# Patient Record
Sex: Female | Born: 1945 | Hispanic: Yes | Marital: Married | Smoking: Never smoker
Health system: Southern US, Community
[De-identification: ages and names within clinical notes are randomized; demographics above are authoritative.]

## PROBLEM LIST (undated history)

## (undated) ENCOUNTER — Emergency Department (HOSPITAL_COMMUNITY): Payer: Medicare Other

## (undated) DIAGNOSIS — I4891 Unspecified atrial fibrillation: Secondary | ICD-10-CM

## (undated) DIAGNOSIS — E079 Disorder of thyroid, unspecified: Secondary | ICD-10-CM

## (undated) DIAGNOSIS — E039 Hypothyroidism, unspecified: Secondary | ICD-10-CM

## (undated) DIAGNOSIS — I639 Cerebral infarction, unspecified: Secondary | ICD-10-CM

## (undated) DIAGNOSIS — E119 Type 2 diabetes mellitus without complications: Secondary | ICD-10-CM

## (undated) DIAGNOSIS — E785 Hyperlipidemia, unspecified: Secondary | ICD-10-CM

## (undated) DIAGNOSIS — I10 Essential (primary) hypertension: Secondary | ICD-10-CM

## (undated) HISTORY — DX: Type 2 diabetes mellitus without complications: E11.9

## (undated) HISTORY — PX: KNEE SURGERY: SHX244

## (undated) HISTORY — DX: Hyperlipidemia, unspecified: E78.5

## (undated) HISTORY — DX: Hypothyroidism, unspecified: E03.9

---

## 2014-01-03 ENCOUNTER — Emergency Department (HOSPITAL_COMMUNITY): Payer: Self-pay

## 2014-01-03 ENCOUNTER — Ambulatory Visit (HOSPITAL_COMMUNITY): Payer: Self-pay | Attending: Family Medicine

## 2014-01-03 ENCOUNTER — Encounter (HOSPITAL_COMMUNITY): Payer: Self-pay | Admitting: Emergency Medicine

## 2014-01-03 ENCOUNTER — Emergency Department (INDEPENDENT_AMBULATORY_CARE_PROVIDER_SITE_OTHER)
Admission: EM | Admit: 2014-01-03 | Discharge: 2014-01-03 | Disposition: A | Discharge status: Home / Self Care | Payer: Self-pay | Source: Home / Self Care | Attending: Family Medicine | Admitting: Family Medicine

## 2014-01-03 DIAGNOSIS — M25562 Pain in left knee: Secondary | ICD-10-CM

## 2014-01-03 DIAGNOSIS — M25569 Pain in unspecified knee: Secondary | ICD-10-CM

## 2014-01-03 DIAGNOSIS — E038 Other specified hypothyroidism: Secondary | ICD-10-CM

## 2014-01-03 DIAGNOSIS — I10 Essential (primary) hypertension: Secondary | ICD-10-CM

## 2014-01-03 HISTORY — DX: Disorder of thyroid, unspecified: E07.9

## 2014-01-03 HISTORY — DX: Essential (primary) hypertension: I10

## 2014-01-03 LAB — POCT I-STAT, CHEM 8
BUN: 21 mg/dL (ref 6–23)
CHLORIDE: 107 meq/L (ref 96–112)
Calcium, Ion: 1.18 mmol/L (ref 1.13–1.30)
Creatinine, Ser: 0.9 mg/dL (ref 0.50–1.10)
Glucose, Bld: 77 mg/dL (ref 70–99)
HEMATOCRIT: 46 % (ref 36.0–46.0)
Hemoglobin: 15.6 g/dL — ABNORMAL HIGH (ref 12.0–15.0)
Potassium: 4.4 mEq/L (ref 3.7–5.3)
SODIUM: 138 meq/L (ref 137–147)
TCO2: 25 mmol/L (ref 0–100)

## 2014-01-03 LAB — TSH: TSH: 9.33 u[IU]/mL — AB (ref 0.350–4.500)

## 2014-01-03 IMAGING — CR DG KNEE AP/LAT W/ SUNRISE*L*
3 series · 3 of 3 positions shown · non-contrast
Comparison: None.

CLINICAL DATA: Left knee pain.

EXAM:
DG KNEE - 3 VIEWS

[t knee ap left]
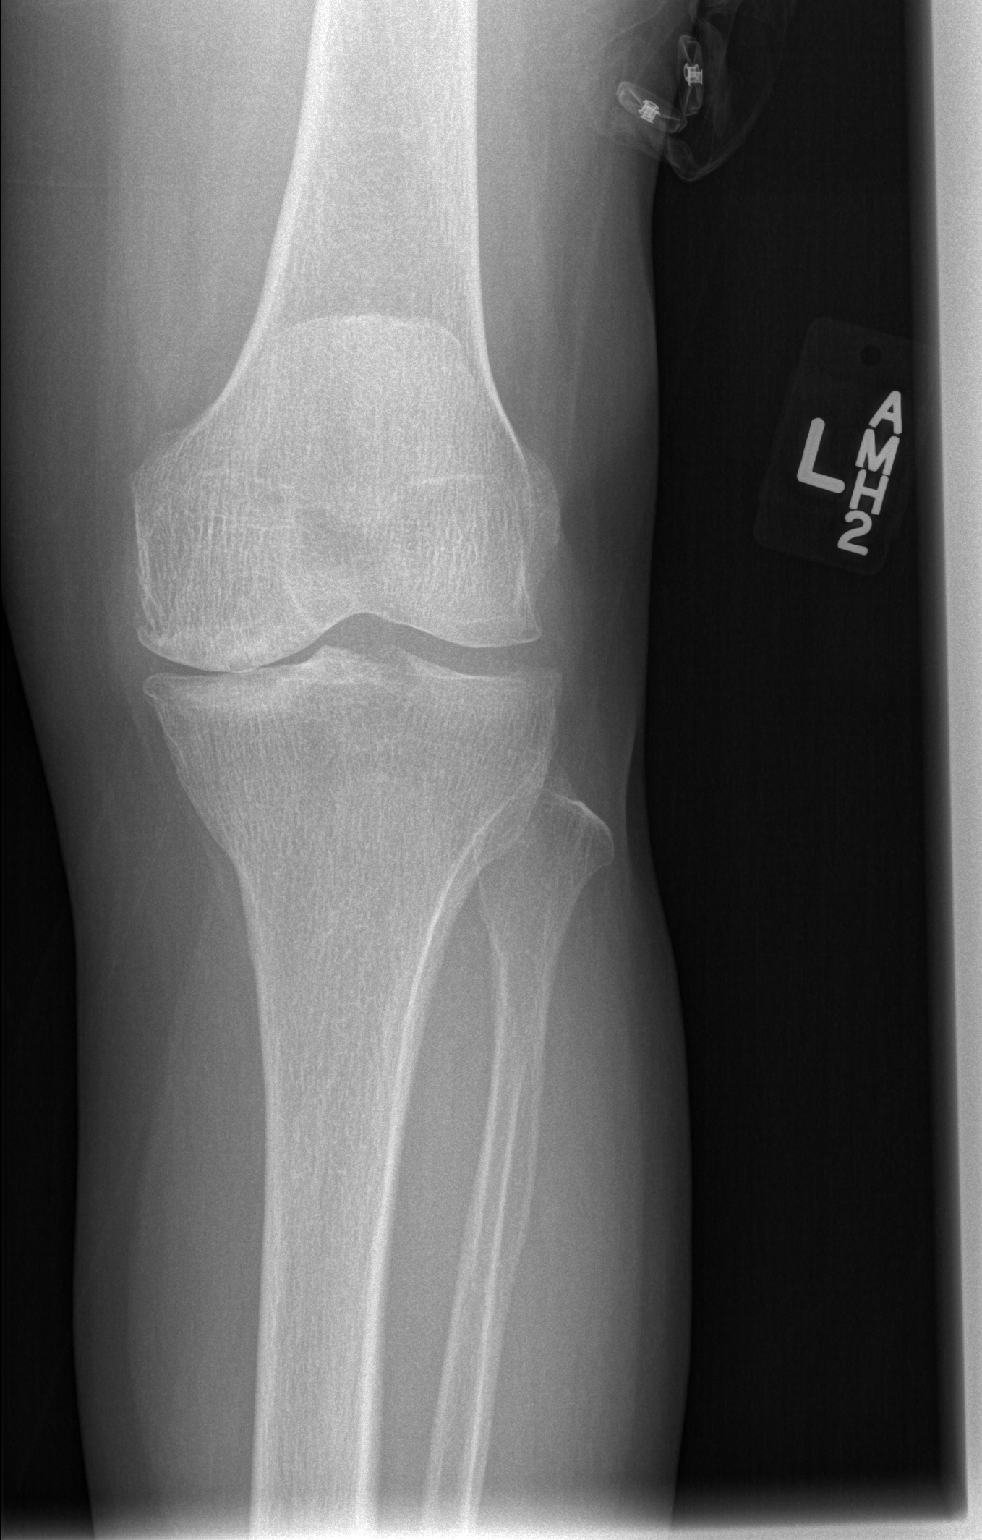

[t knee lat left]
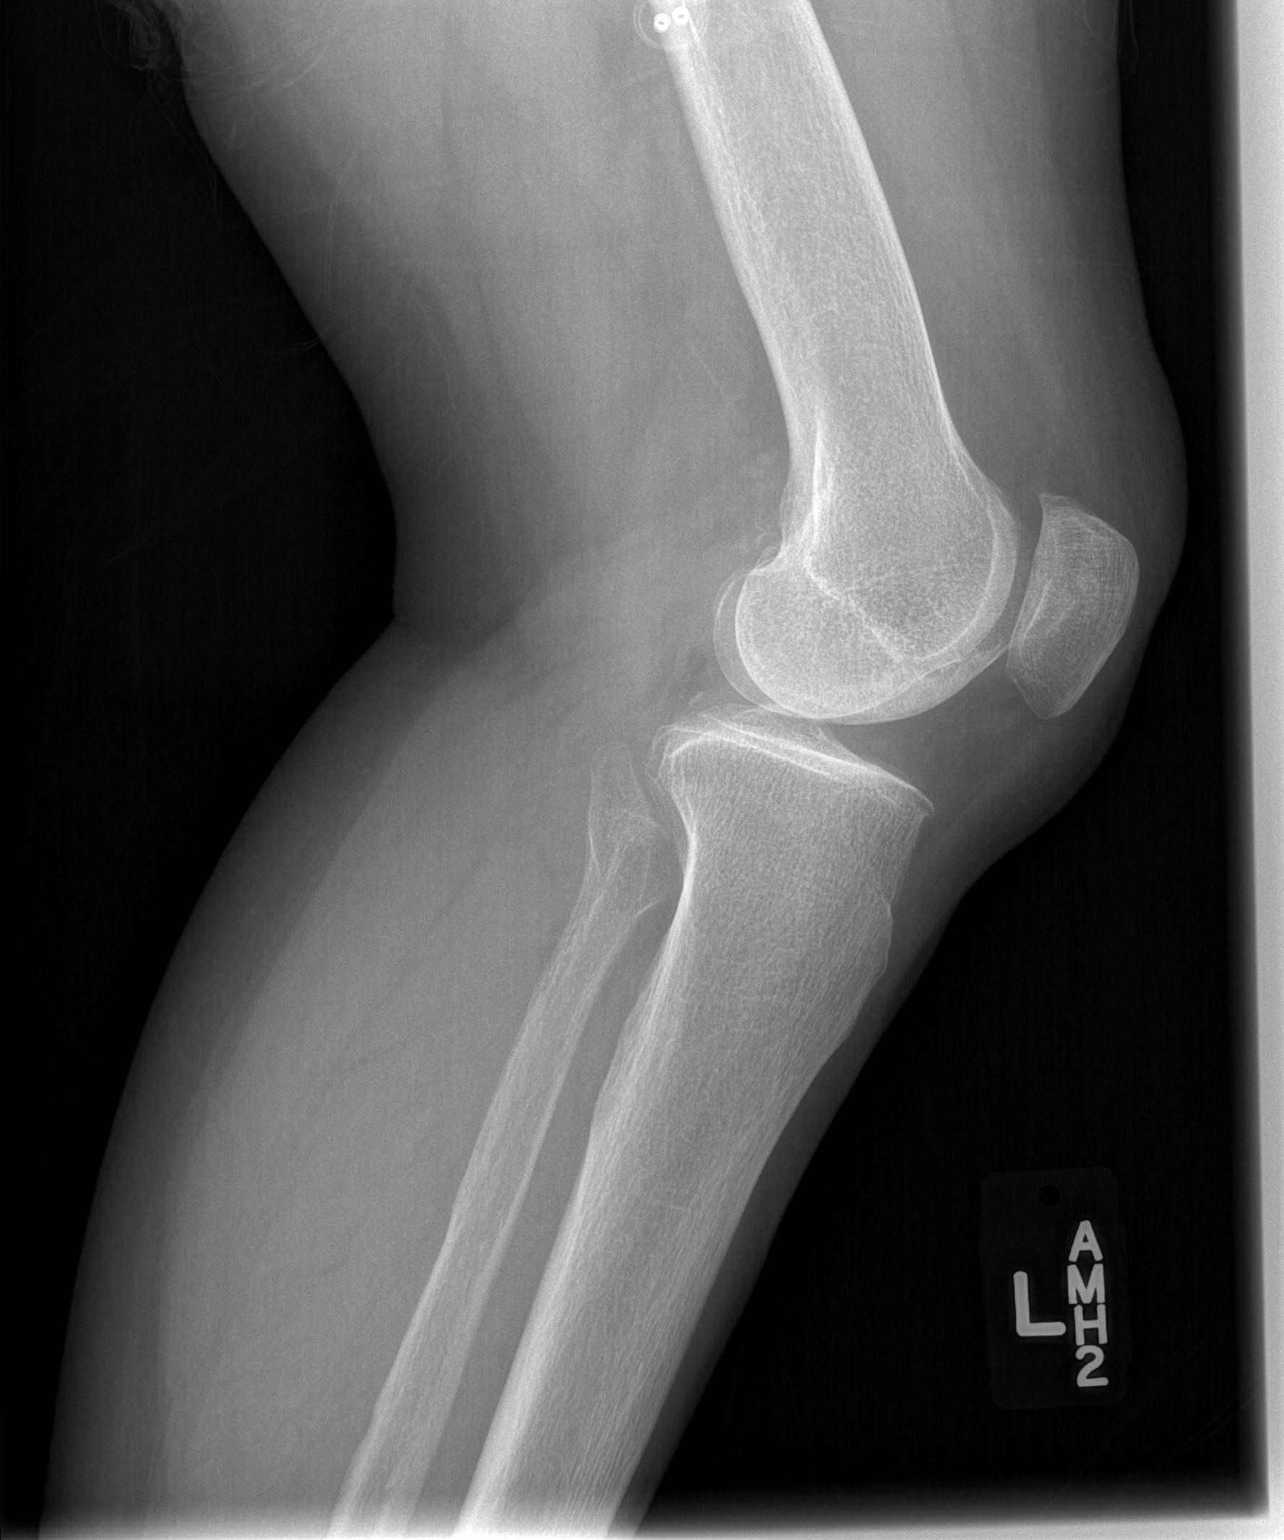

[view not recorded]
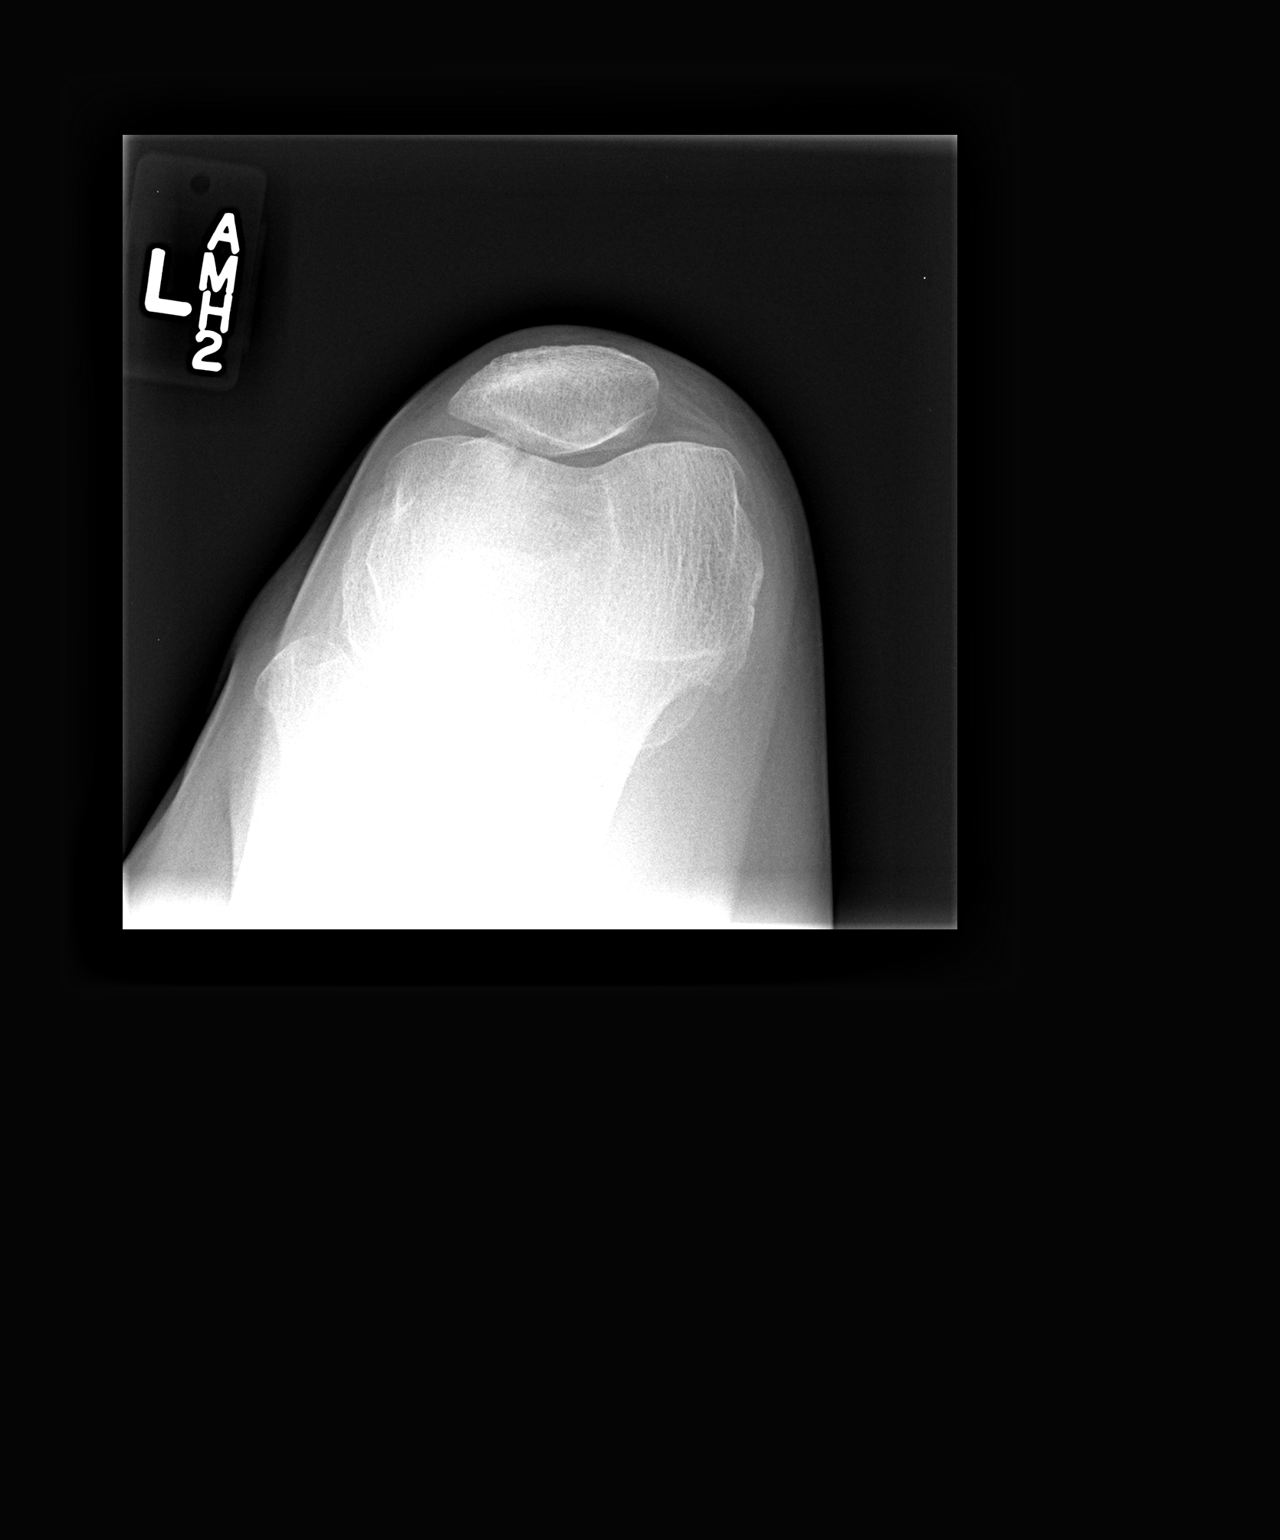

[3 of 3 positions shown; findings below may reference images not displayed]

FINDINGS: The bones appear mildly demineralized. There is moderate joint space
loss and osteophyte formation in the medial compartment. There is
mild lateral and patellofemoral compartment osteophyte formation. No
acute fracture, dislocation or significant joint effusion is seen.
IMPRESSION: Degenerative changes as described, most advanced within the medial
compartment. No acute osseous findings.

## 2014-01-03 MED ORDER — METHYLPREDNISOLONE ACETATE 40 MG/ML IJ SUSP
INTRAMUSCULAR | Status: AC
  Filled 2014-01-03: qty 5

## 2014-01-03 MED ORDER — METHYLPREDNISOLONE ACETATE 40 MG/ML IJ SUSP
40.0000 mg | Freq: Once | INTRAMUSCULAR | Status: AC
  Administered 2014-01-03: 40 mg via INTRA_ARTICULAR

## 2014-01-03 MED ORDER — HYDROCHLOROTHIAZIDE 25 MG PO TABS: 25.0000 mg | ORAL_TABLET | Freq: Every day | ORAL | Status: DC

## 2014-01-03 MED ORDER — LEVOTHYROXINE SODIUM 75 MCG PO TABS: 75.0000 ug | ORAL_TABLET | Freq: Every day | ORAL | Status: DC

## 2014-01-03 MED ORDER — TRAMADOL HCL 50 MG PO TABS: 50.0000 mg | ORAL_TABLET | Freq: Four times a day (QID) | ORAL | Status: DC | PRN

## 2014-01-03 NOTE — ED Provider Notes (Signed)
Jamie Mitchell is a 68 y.o. female who presents to Urgent Care today for left knee pain. Patient lives in [place] part time [place] the [place] part-time. She's had left knee pain for the last 5-6 months. She denies any injury. In [place] she's been treated with NSAIDs for this pain which has not helped. She additionally is being treated for hypothyroidism with levothyroxine 50 mcg daily. She is unaware of hypertension and denies any chest pain palpitations or shortness of breath. Her knee pain is moderate to severe and is worse with activity, climbing stairs, and deep knee bend. She denies any radiating pain weakness or numbness. The pain is predominantly in the anterior aspect of her knee. She notes some swelling.   Past Medical History  Diagnosis Date  . Hypertension   . Thyroid disease    History  Substance Use Topics  . Smoking status: Never Smoker   . Smokeless tobacco: Not on file  . Alcohol Use: No   ROS as above Medications: No current facility-administered medications for this encounter.   Current Outpatient Prescriptions  Medication Sig Dispense Refill  . hydrochlorothiazide (HYDRODIURIL) 25 MG tablet Take 1 tablet (25 mg total) by mouth daily. spanish  30 tablet  0  . levothyroxine (SYNTHROID, LEVOTHROID) 75 MCG tablet Take 1 tablet (75 mcg total) by mouth daily. Spanish  30 tablet  0  . traMADol (ULTRAM) 50 MG tablet Take 1 tablet (50 mg total) by mouth every 6 (six) hours as needed.  15 tablet  0    Exam:  BP 180/120  Pulse 72  Temp(Src) 98.6 F (37 C)  Resp 14  SpO2 100% Gen: Well NAD HEENT: EOMI,  MMM Lungs: Normal work of breathing. CTABL Heart: RRR no MRG Abd: NABS, Soft. Nondistended, Nontender Exts: Brisk capillary refill, warm and well perfused.  Left knee: Mild effusion otherwise normal appearing Full range of motion Nontender Stable ligamentous exam  Knee injection. left Consent obtained and timeout performed. Patient seated in a comfortable  position with legs hanging off the table.  The medial Peri-patellar tendon space was palpated and marked. The skin was then cleaned with alcohol. Cold spray applied. A 25-gauge inch and a half needle was used to inject 40 mg of Depo-Medrol and 4 mL of Marcaine. Patient tolerated procedure well no bleeding. Pain improved following injection    Results for orders placed during the hospital encounter of 01/03/14 (from the past 24 hour(s))  TSH     Status: Abnormal   Collection Time    01/03/14  1:03 PM      Result Value Ref Range   TSH 9.330 (*) 0.350 - 4.500 uIU/mL  POCT I-STAT, CHEM 8     Status: Abnormal   Collection Time    01/03/14  1:22 PM      Result Value Ref Range   Sodium 138  137 - 147 mEq/L   Potassium 4.4  3.7 - 5.3 mEq/L   Chloride 107  96 - 112 mEq/L   BUN 21  6 - 23 mg/dL   Creatinine, Ser 0.90  0.50 - 1.10 mg/dL   Glucose, Bld 77  70 - 99 mg/dL   Calcium, Ion 1.18  1.13 - 1.30 mmol/L   TCO2 25  0 - 100 mmol/L   Hemoglobin 15.6 (*) 12.0 - 15.0 g/dL   HCT 46.0  36.0 - 46.0 %   Dg Knee Ap/lat W/sunrise Left  01/03/2014   CLINICAL DATA:  Left knee pain.  EXAM:  DG KNEE - 3 VIEWS  COMPARISON:  None.  FINDINGS: The bones appear mildly demineralized. There is moderate joint space loss and osteophyte formation in the medial compartment. There is mild lateral and patellofemoral compartment osteophyte formation. No acute fracture, dislocation or significant joint effusion is seen.  IMPRESSION: Degenerative changes as described, most advanced within the medial compartment. No acute osseous findings.   Electronically Signed   By: Camie Patience M.D.   On: 01/03/2014 14:43    Assessment and Plan: 68 y.o. 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 Status post corticosteroid injection. Follow up with sports medicine. Tramadol for pain control. Avoid NSAIDs due to elevated blood pressure.   2) hypertension: New diagnosis. We'll start hydrochlorothiazide. [facility].  3) hypothyroidism: On chronic levothyroxine 50 mcg daily. Appears to be insufficient. We'll increase to 75 mcg daily and followup with [facility].  Discussed warning signs or symptoms. Please see discharge instructions. Patient expresses understanding.   This note was created using Systems analyst. Any transcription errors are unintended.    Gregor Hams, MD 01/03/14 [phone]

## 2014-01-03 NOTE — Discharge Instructions (Signed)
Thank you for coming in today. 1) Knee Pain: Follow up with the sports medicine center. [phone] Tramadol for pain.  Call or go to the ER if you develop a large red swollen joint with extreme pain or oozing puss.   2) Blood Pressure: Start hydrochlorothiazide daily.  Followup at the [facility]  3) thyroid:  Start levothyroxine 75 mcg daily.    Artritis inespecfica (Arthritis, Nonspecific) La artritis es la inflamacin de una articulacin. Los sntomas son dolor, enrojecimiento, calor o hinchazn. Pueden verse involucradas una o ms articulaciones. Hay diferentes tipos de artritis. El mdico no podr Lobbyist cul es el tipo de artritis que usted sufre.  CAUSAS La causa ms frecuente es el desgaste de la articulacin (osteoartritis). Esto ocasiona lesiones en el cartlago, que puede romperse con [place]. Las zonas ms afectadas por este tipo de artritis son las rodillas, caderas, espalda y cuello. Otros tipos de artritis y causas frecuentes de dolor en la articulacin son:  Esguinces y otras lesiones cercanas a la articulacin}. En algunos casos, esguinces y lesiones menores causan dolor e hinchazn que aparece horas ms tarde.  Artritis reumatoidea [employer] Corporation, pies y rodillas. Generalmente afecta ambos lados del cuerpo al [employer]. Generalmente se asocia a enfermedades crnicas, fiebre, prdida de peso y debilidad general.  Artritis por cristales. La gota y la pseudogota pueden causar dolor intenso agudo ocasional, enrojecimiento e hinchazn del pie, el tobillo o la rodilla.  Artritis infecciosa. Las bacterias pueden penetrar en la articulacin a travs de una herida en la piel. Esto puede causar una infeccin en la articulacin. Las bacterias y virus tambin pueden diseminarse a travs del torrente sanguneo y Print production planner las articulaciones.  Reacciones a medicamentos, infecciosas y Camera operator. En algunos casos las  articulaciones duelen levemente y estn ligeramente hinchadas en este tipo de enfermedad. SNTOMAS  El dolor es el sntoma principal.  La articulacin tambin pueden verse roja, hinchada y caliente al tacto.  En ciertos tipos de artritis hay fiebre o malestar general.  En la articulacin que presenta artritis sentir dolor con el movimiento. En otros tipos de artritis hay rigidez. DIAGNSTICO: El mdico sospechar artritis basndose en la descripcin de los sntomas y en el examen. Ser necesario realizar pruebas para diagnosticar el tipo de artritis.  Anlisis de [place] y en algunos casos de [place].  Radiografas y en algunos casos tomografa computada o diagnstico por imgenes.  La remocin del lquido de Water engineer (artrocentesis) se realiza para Aeronautical engineer presencia de bacterias, cristales o por otras causas. Su mdico (o un especialista) adormecern la zona de la articulacin con un anestsico local y utilizarn una aguja para retirar lquido de la articulacin para ser examinado. Este procedimiento es slo mnimamente molesto.  An con estas pruebas, el mdico no podr decir qu tipo de artritis usted sufre. La consulta con un especialista (reumatlogo) puede ser de [place]. TRATAMIENTO El mdico comentar con usted el tratamiento especfico para su tipo de artritis. Si el tipo especfico no puede determinarse, podrn aplicarse las siguientes recomendaciones generales.  El tratamiento para el dolor intenso de las articulaciones consiste en:  Hacer reposo  Elevar el [place].  Podrn prescribirle medicamentos antiinflamatorios (como ibuprofeno). Evite las actividades que aumenten Conservation officer, historic buildings.  Slo tome medicamentos de venta libre o prescriptos para Glass blower/designer y las [place], segn las indicaciones de su mdico.  Puede aplicarse compresas fras sobre la articulacin dolorida durante 10 a 15 minutos cada hora. Las compresas  calientes tambin pueden ser beneficiosas, pero  no las [employer] noche. No use compresas calientes sin autorizacin de su mdico, si es diabtico.  Una inyeccin de corticoides en la articulacin artrtica puede ayudar a reducir el dolor y la hinchazn.  Si una artritis aguda Kerr-McGee siguientes 1  2 Schwana, ser necesario descartar una infeccin. El tratamiento prolongado implica la modificacin de [place] y del estilo de vida para reducir Pension scheme manager. Puede ser necesario que baje de [place]. La actividad fsica es necesaria para nutrir Charity fundraiser de la articulacin y Tax adviser. Esto ayuda a Theatre manager fuertes los msculos que rodean Water engineer. INSTRUCCIONES PARA EL CUIDADO DOMICILIARIO  No tome aspirina para Best boy si se sospecha que sufre gota. Esto eleva los niveles de cido rico.  Solo tome medicamentos que se pueden comprar sin receta o recetados para Conservation officer, historic buildings, Tree surgeon o fiebre, como le indica el mdico.  [place] reposo todo el tiempo que pueda.  Si la articulacin est hinchada, mantngala elevada.  Utilice muletas si la articulacin que le duele est en la pierna.  Beber abundante cantidad de lquidos ser beneficioso para ciertos tipos de artritis.  [place] fsica regular puede ser beneficiosa, incluyendo las actividades de bajo impacto como:  [place].  [place].  Andar en bicicleta.  Caminar.  La rigidez matutina se Charleen Kirks con una ducha caliente.  Tambin es beneficioso que realice ejercicios de amplitud de movimiento. SOLICITE ATENCIN MDICA SI:  No se siente mejor o empeora luego de las 24 horas.  Presenta efectos adversos por los medicamentos y no mejora con Dispensing optician. SOLICITE ATENCIN MDICA INMEDIATAMENTE SI:  Tiene fiebre.  Presenta fiebre o dolor intenso, hinchazn o enrojecimiento.  Muchas articulaciones estn involucradas y estn hinchadas y Tree surgeon.  Tiene un dolor intenso en la  espalda o siente debilidad en las piernas.  Pierde el control de la vejiga o del intestino. Document Released: 05/11/2005 Document Revised: 08/03/2011 [facility] Patient Information 2015 [place], [place]. This information is not intended to replace advice given to you by your health care provider. Make sure you discuss any questions you have with your health care provider.  Hipertensin (Hypertension) La hipertensin, conocida comnmente como presin arterial alta, se produce cuando la sangre bombea en las arterias con mucha fuerza. Las arterias son los vasos sanguneos que transportan la sangre desde el corazn hacia todas las partes del cuerpo. Una lectura de la presin arterial consiste en un nmero ms alto sobre un nmero ms bajo, por ejemplo, 110/72. El nmero ms alto (presin sistlica) corresponde a la presin interna de las arterias cuando el corazn [place]. El nmero ms bajo (presin diastlica) corresponde a la presin interna de las arterias cuando el corazn se relaja. En condiciones ideales, la presin arterial debe ser inferior a 120/80. La hipertensin fuerza al corazn a trabajar ms para Herbalist. Las arterias pueden estrecharse o ponerse rgidas. La hipertensin conlleva el riesgo de enfermedad cardaca, ictus y otros problemas.  [place] de riesgo de hipertensin son controlables, pero otros no lo son.  [employer] factores de riesgo que usted no puede Chief Technology Officer, se incluyen:   Manufacturing systems engineer. El riesgo es mayor para las Retail banker.  La edad. Los riesgos aumentan con la edad.  El sexo. Antes de los 45aos, los hombres corren ms [employer]. Despus de los 65aos, las mujeres corren ms Hun[employer] Corporation  hombres. Entre los factores de riesgo que usted puede Chief Technology Officer, se incluyen:  No hacer la cantidad suficiente de actividad fsica o ejercicio.  Tener sobrepeso.  Consumir mucha grasa, azcar, caloras o sal en la  dieta.  Beber alcohol en exceso. SIGNOS Y SNTOMAS Por lo general, la hipertensin no causa signos o sntomas. La hipertensin demasiado alta (crisis hipertensiva) puede causar dolor de cabeza, ansiedad, falta de aire y hemorragia nasal. DIAGNSTICO  Para detectar si usted tiene hipertensin, el mdico le medir la presin arterial mientras est sentado, con el brazo levantado a la altura del corazn. Debe medirla al [facility] veces en el mismo brazo. Determinadas condiciones pueden causar una diferencia de presin arterial entre el brazo izquierdo y Insurance underwriter. El hecho de tener una sola lectura de la presin arterial ms alta que lo normal no significa que Stage manager. En el caso de tener una lectura de la presin arterial con un valor alto, pdale al mdico que la verifique nuevamente. [place] hipertensin arterial incluye hacer cambios en el estilo de vida y, posiblemente, tomar medicamentos. Un estilo de vida saludable puede ayudar a bajar la presin arterial alta. Quiz deba cambiar algunos hbitos. Los Levi Strauss en el estilo de vida pueden incluir:  Seguir la dieta DASH. Esta dieta tiene un alto contenido de frutas, verduras y Psychologist, prison and probation services. Incluye poca cantidad de sal, carnes rojas y azcares agregados.  Hacer al menos 2horas de actividad fsica enrgica todas las semanas.  Perder peso, si es necesario.  No fumar.  Limitar el consumo de bebidas alcohlicas.  Aprender formas de reducir el estrs. Si los cambios en el estilo de vida no son suficientes para Child psychotherapist la presin arterial, el mdico puede recetarle medicamentos. Quiz necesite tomar ms de uno. Trabaje en conjunto con su mdico para comprender los riesgos y los beneficios. INSTRUCCIONES PARA EL CUIDADO EN EL HOGAR  Haga que le midan de nuevo la presin arterial segn las indicaciones del [place] los medicamentos solamente como se lo haya indicado el mdico. Siga  cuidadosamente las indicaciones. Los medicamentos para la presin arterial deben tomarse segn las indicaciones. Los medicamentos pierden eficacia al omitir las dosis. El hecho de omitir las dosis tambin [place] el riesgo de otros problemas.  No fume.  Contrlese la presin arterial en su casa segn las indicaciones del mdico. SOLICITE ATENCIN MDICA SI:   Piensa que tiene una reaccin alrgica a los medicamentos.  Tiene mareos o dolores de cabeza con Scientist, research (physical sciences).  Tiene hinchazn en los tobillos.  Tiene problemas de visin. SOLICITE ATENCIN MDICA DE INMEDIATO SI:  Siente un dolor de cabeza intenso o confusin.  Siente debilidad inusual, adormecimiento o que Geneticist, molecular.  Siente dolor intenso en el pecho o en el abdomen.  Vomita repetidas veces.  Tiene dificultad para respirar. ASEGRESE DE QUE:   Comprende estas instrucciones.  Controlar su afeccin.  Recibir ayuda de inmediato si no mejora o si empeora. Document Released: 05/11/2005 Document Revised: 09/25/2013 [facility] Patient Information 2015 [place], [place]. This information is not intended to replace advice given to you by your health care provider. Make sure you discuss any questions you have with your health care provider.   Hipotiroidismo (Hypothyroidism) La tiroides es una glndula grande ubicada en la parte anterior e inferior del cuello. La glndula tiroides interviene en el control del metabolismo. El metabolismo es el modo en que el organismo utiliza los alimentos. El control del metabolismo se realiza a Designer, jewellery de  una hormona denominada tiroxina. Cuando la actividad de esta glndula est por debajo de lo normal (hipotiroidismo) produce muy poca cantidad de hormona. CAUSAS Aqu se incluyen:   Ausencia de tejido tiroideo.  Bocio por dficit de yodo.  Bocio por medicamentos.  Defectos congnitos (desde el nacimiento).  Trastornos de la glndula pituitaria Esto ocasiona la falta de TSH (sigla que  significa hormona estimulante de la tiroides) Esta hormona le informa a la tiroides que debe producir ms hormona. SNTOMAS  Letargia (sentir que no se tiene Teacher, early years/pre)  Intolerancia al fro  [employer] (a pesar de una ingesta normal de alimentos)  Piel seca  Cabello seco  Irregularidades menstruales  Enlentecimiento de los procesos de pensamiento La insuficiente cantidad de hormona tiroidea tambin puede ocasionar problemas cardacos. El hipotiroidismo en el recin nacido es el cretinismo en su forma extrema. Es importante que esta forma se trate de modo adecuado e inmediato, ya que puede conducir rpidamente al retardo del desarrollo fsico y mental. DIAGNSTICO Para comprobar la existencia de hipotiroidismo, Mining engineer anlisis de sangre y radiografas y estudios con [place]. Muchas veces los signos estn ocultos. Es necesario que el profesional vigile la enfermedad con anlisis de [place]. Esto se realiza luego de Electrical engineer diagnstico (determinar cul es el problema). Puede ser necesario que el profesional que lo asiste controle esta enfermedad con anlisis de sangre ya sea antes o despus del diagnstico y [place]. TRATAMIENTO Los niveles bajos de hormona tiroidea se incrementan con el uso de hormona tiroidea sinttica. Este es un tratamiento seguro y [place]. Se dispone de hormona tiroidea sinttica para el tratamiento de este trastorno. Generalmente lleva algunas semanas obtener el efecto total de los medicamentos. Luego de obtener el efecto completo del [place], habitualmente pasan otras cuatro semanas para que los sntomas empiezan a Armed forces operational officer. El profesional podr comenzar indicndole dosis bajas. Si usted tuvo problemas cardacos, la dosis se aumentar de manera gradual. Podr volver a lo normal sin Firefighter una situacin de emergencia. [place] los Pulte Homes ha indicado el profesional que  lo asiste. Infrmele al profesional todos los medicamentos que toma o que ha comenzado a Radio producer. El profesional que lo asiste lo ayudar con los esquemas de las dosis.  A medida que obtiene mejora, es necesario aumentar la dosis. Ser necesario Optometrist continuos anlisis de [place], segn lo indique el profesional.  Informe acerca de todos los efectos secundarios que sospeche que podran deberse a los medicamentos. SOLICITE ATENCIN MDICA SI: Solicite atencin mdica si observa:  Sudoracin.  Temblores.  Ansiedad.  Rpida prdida de peso.  Intolerancia al calor.  Cambios emocionales.  Diarrea.  Debilidad. SOLICITE ATENCIN MDICA DE INMEDIATO SI: Presenta dolor en el pecho, una frecuencia cardaca irregular (palpitaciones) o latidos cardacos rpidos. EST SEGURO QUE:   Comprende las instrucciones para el alta mdica.  Controlar su enfermedad.  Solicitar atencin mdica de inmediato segn las indicaciones. Document Released: 05/11/2005 Document Revised: 08/03/2011 [facility] Patient Information 2015 [place]. This information is not intended to replace advice given to you by your health care provider. Make sure you discuss any questions you have with your health care provider.

## 2014-01-03 NOTE — ED Notes (Signed)
Pt  Reports  l  Knee pain   For  Months   -  denys  Any  Injury        Has no  PCP  In  gso        pT S  BP  IS  HIGH   TAKES  NO  MEDS  HISTORY THYRIOD  PROBLEMS  TAKES THYRIOD  MED      Awake  And  Alert  And  Oriented   Sitting upright on  Exam table  Speaking in  Complete  sentances

## 2014-01-15 ENCOUNTER — Ambulatory Visit: Payer: Self-pay | Admitting: Family Medicine

## 2014-01-19 ENCOUNTER — Ambulatory Visit (INDEPENDENT_AMBULATORY_CARE_PROVIDER_SITE_OTHER): Payer: Self-pay | Admitting: Family Medicine

## 2014-01-19 ENCOUNTER — Encounter: Payer: Self-pay | Admitting: Family Medicine

## 2014-01-19 VITALS — BP 130/79 | HR 79 | Ht 61.0 in | Wt 146.0 lb

## 2014-01-19 DIAGNOSIS — E039 Hypothyroidism, unspecified: Secondary | ICD-10-CM

## 2014-01-19 DIAGNOSIS — M1710 Unilateral primary osteoarthritis, unspecified knee: Secondary | ICD-10-CM

## 2014-01-19 DIAGNOSIS — M1712 Unilateral primary osteoarthritis, left knee: Secondary | ICD-10-CM

## 2014-01-19 NOTE — Progress Notes (Signed)
   Subjective:    Patient ID: Jamie Mitchell, female    DOB: May 02, 1946, 68 y.o.   MRN: [id number]  HPI Patient presents today for left knee pain x 8 months.  Recently seen in urgent care and diagnoses with DJD.  Injection was done there a few weeks ago.  She has gotten great relief.  She denies injury.  No instability but a lot of pain after squatting or bending and then standing.  No swelling or effusions.  No redness of the knee.  No fevers.  She hasn't had previous surgeries or procedures on the knee other than the recent injection.  Pain is worse with activity.  No pain in other joints.  Some occasional shooting sensation down her anterior leg from the knee.  Taking tramadol as well, which has helped with the pain  Review of Systems As per HPI    Objective:   Physical Exam Gen: Pleasant, NAD, well appearing, spanish speaking female  Knee: Knee intact to testing of ACL, PCL, MCL, and LCL No patella apprehension, no pain to FABER testing No effusion noted, no pain to patella motion Minor tenderness along medial and lateral joint line Minor tenderness with McMurray testing Sensation intact in bilateral lower extremities  Neuro: Alert, pleasant, oriented    Assessment & Plan:  Left knee osteoarthritis with pain   Patient with left knee DJD.  No signs of neurological compromise.  Knee is stable to exam.  Previous plain films from UC reviewed today consistent with d/x.  She had an injection a few weeks ago which helped significantly.  -She can continue tramadol, would advise for PCP to prescribe  -She will call for return appointment as needed for injection therapy after three months  -Advised on natural course of arthritis, management strategies  -All questions answered, f/u PRN as needed for further injections

## 2014-01-22 ENCOUNTER — Ambulatory Visit: Payer: Self-pay | Attending: Internal Medicine | Admitting: Internal Medicine

## 2014-01-22 ENCOUNTER — Encounter: Payer: Self-pay | Admitting: Internal Medicine

## 2014-01-22 VITALS — BP 115/76 | HR 74 | Temp 98.1°F | Resp 16 | Ht 60.0 in | Wt 150.0 lb

## 2014-01-22 DIAGNOSIS — Z7982 Long term (current) use of aspirin: Secondary | ICD-10-CM | POA: Insufficient documentation

## 2014-01-22 DIAGNOSIS — Z79899 Other long term (current) drug therapy: Secondary | ICD-10-CM | POA: Insufficient documentation

## 2014-01-22 DIAGNOSIS — B009 Herpesviral infection, unspecified: Secondary | ICD-10-CM | POA: Insufficient documentation

## 2014-01-22 DIAGNOSIS — I10 Essential (primary) hypertension: Secondary | ICD-10-CM | POA: Insufficient documentation

## 2014-01-22 DIAGNOSIS — E039 Hypothyroidism, unspecified: Secondary | ICD-10-CM | POA: Insufficient documentation

## 2014-01-22 MED ORDER — ACYCLOVIR 400 MG PO TABS: 400.0000 mg | ORAL_TABLET | Freq: Two times a day (BID) | ORAL | Status: DC

## 2014-01-22 MED ORDER — HYDROCHLOROTHIAZIDE 25 MG PO TABS: 25.0000 mg | ORAL_TABLET | Freq: Every day | ORAL | Status: DC

## 2014-01-22 MED ORDER — LEVOTHYROXINE SODIUM 75 MCG PO TABS: 75.0000 ug | ORAL_TABLET | Freq: Every day | ORAL | Status: DC

## 2014-01-22 NOTE — Patient Instructions (Signed)
Herpes labial  ([place])  El herpes labial (herpes febril) es una infeccin cutnea causada por el virus del herpes simplex (HSV-1). El HSV-1 est muy relacionado con el virus que causa el herpes genital (HSV-2), pero no son iguales aunque ambos causen infecciones orales y Teaching laboratory technician. El herpes labial son pequeas llagas llenas de lquido que se forman dentro de la boca o en los labios, [place], [place], [place], mejillas o dedos.  El herpes simplex se transmite fcilmente (se [place]) a Producer, television/film/video a travs del Diplomatic Services operational officer personal, como los besos o el compartir utensilios personales. El tambin puede extenderse a otras partes del cuerpo, como a los ojos o a los genitales. El herpes labial se contagia hasta que las llagas se cicatrizan completamente. Generalmente se curan en 2 semanas.  Una vez que una persona se infecta, el herpes simplex permanece siempre en el organismo. Por lo tanto, no hay cura, y generalmente vuelve a aparecer cuando la persona est muy cansada, estresada, enferma o toma mucho sol. Los factores adicionales que pueden favorecer una recurrencia son los cambios hormonales de la menstruacin o el [place], ciertos frmacos y [place] fro.  CAUSAS  La causa del herpes labial es el virus del herpes simplex. El virus se contagia de Ardelia Mems persona a otra a travs del contacto directo, como al besarse, tocar la zona afectada o compartir elementos personales como protector labial, afeitadoras o cubiertos.  SNTOMAS  La primera infeccin puede no causar sntomas. Si aparecen sntomas, tendrn diferentes etapas. De este modo se desarrolla el herpes labial:   [employer] antes del brote, podr sentir pinchazos, picazn o sensacin ardiente.   Las [employer] de lquido aparecen [employer] labios, dentro de la boca, la nariz o en las [place].   Las ampollas comienzan a supurar un lquido Engineer, structural.   Las ampollas se secan y aparece una Journalist, newspaper.   La costra  se cae.  Los sntomas dependen si es el brote inicial o Engineer, building services. Algunos otros sntomas del primer brote son:   Cristy Hilts.   Dolor de Investment banker, operational.   Dolor de [place].   Dolores musculares.   Ganglios hinchados en el cuello.  [address] se realiza segn los sntomas y la observacin de las llagas. En algunos casos, se pasa un hisopo en los labios y luego se examina en el laboratorio para Optometrist un diagnstico final. Si no hay llagas, los anlisis de sangre podrn hallar el virus del herpes simplex.  TRATAMIENTO  No hay cura para el herpes labial y no existe una vacuna para el virus del herpes simplex. Dentro de las 2 semanas, la mayor parte de las Doctor, general practice sin tratamiento. Los medicamentos no curan la infeccin, pero algunos frmacos pueden ayudar a Best boy asociado a las llagas, pueden impedir que el virus se multiplique y Training and development officer tiempo de curacin. Los medicamentos se presentan en forma de crema, gel, comprimidos o inyeccin.  INSTRUCCIONES PARA EL CUIDADO EN EL HOGAR   Solo tome medicamentos de venta libre o recetados para Conservation officer, historic buildings, Tree surgeon o fiebre, segn las indicaciones del mdico. No tome aspirina.   Use un hisopo de algodn para aplicar crema o gel en las llagas.   No se toque las ampollas ni retire las Surveyor, mining. Lave sus manos con frecuencia. No se toque los ojos sin antes lavarse las manos.   Evite los besos, el sexo oral y compartir artculos personales hasta que las llagas se  curen.   [place] bolsa de hielo sobre las llagas durante 10 a 15 minutos para Runner, broadcasting/film/video.   Evite las comidas calientes, fras o saladas ya que pueden lastimar la boca. Consuma una dieta blanda para evitar la irritacin de las llagas. Use un sorbete si siente dolor al beber con un vaso.   [place] higienizadas y secas para evitar la infeccin en otros tejidos.   Evite el sol y evite las situaciones de estrs si es lo que  causa las llagas. Si el sol causa las llagas, aplique pantalla solar sobre los labios antes de salir al sol.  SOLICITE ATENCIN MDICA SI:   Tiene fiebre o sntomas persistentes durante ms de 2  3 das.   Tiene fiebre y los sntomas empeoran.   Observa pus, un lquido que no es claro, en las llagas.   El enrojecimiento se expande.   Siente dolor o irritacin en el ojo.   Tiene llagas en los genitales.   Las llagas no se curan en 2 semanas.   Su sistema inmunolgico est debilitado.   Tiene frecuentes recurrencias del herpes labial.  ASEGRESE DE QUE:   Comprende estas instrucciones.  Controlar su enfermedad.  Solicitar ayuda de inmediato si no mejora o si empeora. Document Released: 05/11/2005 Document Revised: 02/03/2012 [facility] Patient Information 2015 [place]. This information is not intended to replace advice given to you by your health care provider. Make sure you discuss any questions you have with your health care provider.

## 2014-01-22 NOTE — Progress Notes (Signed)
Patient ID: Stephanye Finnicum, female   DOB: 05/12/1946, 68 y.o.   MRN: [id number]  BOF:[id number]  DPO:[id number]  DOB - 1946-04-10  CC:  Chief Complaint  Patient presents with  . Establish Care       HPI: Amenah Tucci is a 68 y.o. female here today to establish medical care.  Patient presents today with concerns of blisters on lip that have been present for 5 days.  She began taking HCTZ, synthroid, tramadol on 8/14 and is concerned that it may be the reason. She denies rash, itching, or swelling.  She reports that she went to the beach last week and was in the sun all day.  She reports that she had a similar rash back in 2013 that produced sores on her lips after switching her thyroid medication.  She denies smoking, tobacco use, alcohol use. Patient denies all other complaints today.  Patient has No headache, No chest pain, No abdominal pain - No Nausea, No new weakness tingling or numbness, No Cough - SOB.  No Known Allergies Past Medical History  Diagnosis Date  . Hypertension   . Thyroid disease    Current Outpatient Prescriptions on File Prior to Visit  Medication Sig Dispense Refill  . aspirin 81 MG tablet Take 81 mg by mouth daily.      . hydrochlorothiazide (HYDRODIURIL) 25 MG tablet Take 1 tablet (25 mg total) by mouth daily. spanish  30 tablet  0  . levothyroxine (SYNTHROID, LEVOTHROID) 75 MCG tablet Take 1 tablet (75 mcg total) by mouth daily. Spanish  30 tablet  0  . traMADol (ULTRAM) 50 MG tablet Take 1 tablet (50 mg total) by mouth every 6 (six) hours as needed.  15 tablet  0   No current facility-administered medications on file prior to visit.   History reviewed. No pertinent family history. History   Social History  . Marital Status: Married    Spouse Name: N/A    Number of Children: N/A  . Years of Education: N/A   Occupational History  . Not on file.   Social History Main Topics  . Smoking status: Never Smoker   . Smokeless tobacco:  Not on file  . Alcohol Use: No  . Drug Use: Not on file  . Sexual Activity: Not on file   Other Topics Concern  . Not on file   Social History Narrative  . No narrative on file    Review of Systems: Constitutional: Negative for fever, chills, diaphoresis, activity change, appetite change and fatigue. HENT: Negative for ear pain, nosebleeds, congestion, facial swelling, rhinorrhea, neck pain, neck stiffness and ear discharge.  Eyes: Negative for pain, discharge, redness, itching and visual disturbance. Respiratory: Negative for cough, choking, chest tightness, shortness of breath, wheezing and stridor.  Cardiovascular: Negative for chest pain, palpitations and leg swelling. Gastrointestinal: Negative for abdominal distention. Genitourinary: Negative for dysuria, urgency, frequency, hematuria, flank pain, decreased urine volume, difficulty urinating and dyspareunia.  Musculoskeletal: Negative for back pain, joint swelling, arthralgia and gait problem. Neurological: Negative for dizziness, tremors, seizures, syncope, facial asymmetry, speech difficulty, weakness, light-headedness, numbness and headaches.  Hematological: Negative for adenopathy. Does not bruise/bleed easily. Psychiatric/Behavioral: Negative for hallucinations, behavioral problems, confusion, dysphoric mood, decreased concentration and agitation.    Objective:   Filed Vitals:   01/22/14 1613  BP: 115/76  Pulse: 74  Temp: 98.1 F (36.7 C)  Resp: 16    Physical Exam: Constitutional: Patient appears well-developed and well-nourished. No distress. HENT: Normocephalic, atraumatic,  External right and left ear normal. Oropharynx is clear and moist.  Eyes: Conjunctivae and EOM are normal. PERRLA, no scleral icterus. Neck: Normal ROM. Neck supple. No JVD. No tracheal deviation. No thyromegaly. CVS: RRR, S1/S2 +, no murmurs, no gallops, no carotid bruit.  Pulmonary: Effort and breath sounds normal, no stridor, rhonchi,  wheezes, rales.  Abdominal: Soft. BS +, no distension, tenderness, rebound or guarding.  Musculoskeletal: Normal range of motion. No edema and no tenderness.  Lymphadenopathy: No lymphadenopathy noted, cervical, inguinal or axillary Neuro: Alert. Normal reflexes, muscle tone coordination. No cranial nerve deficit. Skin: Skin is warm and dry. No rash noted. Not diaphoretic. No erythema. No pallor. 2 open blisters noted on patient's bottom lip-appears to be herpes lesion. Psychiatric: Normal mood and affect. Behavior, judgment, thought content normal.  Lab Results  Component Value Date   HGB 15.6* 01/03/2014   HCT 46.0 01/03/2014   Lab Results  Component Value Date   CREATININE 0.90 01/03/2014   BUN 21 01/03/2014   NA 138 01/03/2014   K 4.4 01/03/2014   CL 107 01/03/2014    No results found for this basename: HGBA1C   Lipid Panel  No results found for this basename: chol, trig, hdl, cholhdl, vldl, ldlcalc       Assessment and plan:   Mackenzie was seen today for establish care.  Diagnoses and associated orders for this visit:  Unspecified hypothyroidism - levothyroxine (SYNTHROID, LEVOTHROID) 75 MCG tablet; Take 1 tablet (75 mcg total) by mouth daily. Spanish  Essential hypertension - hydrochlorothiazide (HYDRODIURIL) 25 MG tablet; Take 1 tablet (25 mg total) by mouth daily. spanish  HSV (herpes simplex virus) infection - acyclovir (ZOVIRAX) 400 MG tablet; Take 1 tablet (400 mg total) by mouth 2 (two) times daily. - Viral culture     Return in about 3 months (around 04/23/2014) for HTN/hypothyroidism-repeat TSH.      Chari Manning, [place] [facility] [phone] 01/22/2014, 4:36 PM

## 2014-01-22 NOTE — Progress Notes (Signed)
Pt is here today to establish care. Pt started taking new medications and she has had a adverse affects /w sores on her lips. Pt is unsure which medication is giving her the reaction. Pt has a history of hypothyroidism and HTN

## 2014-01-25 DIAGNOSIS — M1710 Unilateral primary osteoarthritis, unspecified knee: Secondary | ICD-10-CM | POA: Insufficient documentation

## 2014-01-25 DIAGNOSIS — E039 Hypothyroidism, unspecified: Secondary | ICD-10-CM | POA: Insufficient documentation

## 2014-01-25 DIAGNOSIS — M179 Osteoarthritis of knee, unspecified: Secondary | ICD-10-CM | POA: Insufficient documentation

## 2014-01-25 NOTE — Progress Notes (Signed)
Patient ID: Jamie Mitchell, female   DOB: 1946/03/04, 68 y.o.   MRN: [id number] [place] Attending Note: I have seen and examined this patient. I have discussed this patient with the resident and reviewed the assessment and plan as documented above. I agree with the resident's findings and plan. With the aid of an interpreter we discussed improvement with her left knee injection done at urgent care. I would recommend she followup when necessary the goal of not doing the injections any more often than every 3 months. Her PCP can handle her hypothyroidism and any knee she has for chronic pain medicine

## 2014-01-29 LAB — VIRAL CULTURE VIRC: ORGANISM ID, BACTERIA: NEGATIVE

## 2014-01-30 ENCOUNTER — Telehealth: Payer: Self-pay | Admitting: Internal Medicine

## 2014-01-30 ENCOUNTER — Ambulatory Visit: Payer: Self-pay | Attending: Internal Medicine

## 2014-01-30 NOTE — Telephone Encounter (Signed)
Patient has come in today to let the PCP know that she will be traveling out of town today and will need to schedule an appropriate time to come in for a BP Check with the nurse;

## 2014-02-02 ENCOUNTER — Other Ambulatory Visit: Payer: Self-pay | Admitting: Internal Medicine

## 2014-02-02 DIAGNOSIS — E039 Hypothyroidism, unspecified: Secondary | ICD-10-CM

## 2014-02-02 MED ORDER — LEVOTHYROXINE SODIUM 75 MCG PO TABS: 75.0000 ug | ORAL_TABLET | Freq: Every day | ORAL | Status: DC

## 2014-04-10 ENCOUNTER — Telehealth: Payer: Self-pay | Admitting: Internal Medicine

## 2014-04-10 NOTE — Telephone Encounter (Signed)
Patient's daughter has come in today to request a script for tramadol; patient was last seen in August and received the medication from the urgent care; Patient is not experiencing a lot of pain but rather a mild pinching sensation in her knees; please f/u with patient about this request to let her know;

## 2014-04-24 ENCOUNTER — Telehealth: Payer: Self-pay | Admitting: Internal Medicine

## 2014-04-24 NOTE — Telephone Encounter (Signed)
Pt is requesting a refill for traMADol (ULTRAM) 50 MG tablet. Pt is in waiting room.

## 2014-04-30 ENCOUNTER — Ambulatory Visit: Payer: Self-pay | Attending: Internal Medicine | Admitting: Internal Medicine

## 2014-04-30 ENCOUNTER — Encounter: Payer: Self-pay | Admitting: Internal Medicine

## 2014-04-30 VITALS — BP 134/83 | HR 61 | Temp 97.5°F | Resp 16 | Ht 60.0 in | Wt 158.0 lb

## 2014-04-30 DIAGNOSIS — M1712 Unilateral primary osteoarthritis, left knee: Secondary | ICD-10-CM | POA: Insufficient documentation

## 2014-04-30 DIAGNOSIS — Z7982 Long term (current) use of aspirin: Secondary | ICD-10-CM | POA: Insufficient documentation

## 2014-04-30 DIAGNOSIS — R059 Cough, unspecified: Secondary | ICD-10-CM

## 2014-04-30 DIAGNOSIS — I10 Essential (primary) hypertension: Secondary | ICD-10-CM | POA: Insufficient documentation

## 2014-04-30 DIAGNOSIS — Z79899 Other long term (current) drug therapy: Secondary | ICD-10-CM | POA: Insufficient documentation

## 2014-04-30 DIAGNOSIS — R05 Cough: Secondary | ICD-10-CM | POA: Insufficient documentation

## 2014-04-30 DIAGNOSIS — E039 Hypothyroidism, unspecified: Secondary | ICD-10-CM | POA: Insufficient documentation

## 2014-04-30 MED ORDER — TRAMADOL HCL 50 MG PO TABS: 50.0000 mg | ORAL_TABLET | Freq: Two times a day (BID) | ORAL | Status: DC | PRN

## 2014-04-30 MED ORDER — GUAIFENESIN ER 600 MG PO TB12: 600.0000 mg | ORAL_TABLET | Freq: Two times a day (BID) | ORAL | Status: DC | PRN

## 2014-04-30 MED ORDER — HYDROCHLOROTHIAZIDE 25 MG PO TABS: 25.0000 mg | ORAL_TABLET | Freq: Every day | ORAL | Status: DC

## 2014-04-30 NOTE — Progress Notes (Signed)
Patient ID: Jamie Mitchell, female   DOB: 20-Feb-1946, 68 y.o.   MRN: [id number]  CC: follow up  HPI:  Patient presents to clinic today for a follow up of hypertension and hypothyroidism.  Patient states that she needs refills of her medications.  She states that she takes her medication daily without any complications. She is planning to go to [place] this weekend for six months and would like enough refills until she returns home.  She states that for 5 days she had symptoms of a cold that are beginning to resolve. She was having some cough and rhinitis.  She was taking Nyquil and tea.  She denies fevers, chills, mucous productions, sore throat, and chest discomfort.  Cough is more severe at night.  She would like a cough syrup.     No Known Allergies Past Medical History  Diagnosis Date  . Hypertension   . Thyroid disease    Current Outpatient Prescriptions on File Prior to Visit  Medication Sig Dispense Refill  . hydrochlorothiazide (HYDRODIURIL) 25 MG tablet Take 1 tablet (25 mg total) by mouth daily. spanish 30 tablet 3  . levothyroxine (SYNTHROID, LEVOTHROID) 75 MCG tablet Take 1 tablet (75 mcg total) by mouth daily. Spanish 90 tablet 3  . acyclovir (ZOVIRAX) 400 MG tablet Take 1 tablet (400 mg total) by mouth 2 (two) times daily. (Patient not taking: Reported on 04/30/2014) 14 tablet 0  . aspirin 81 MG tablet Take 81 mg by mouth daily.    . traMADol (ULTRAM) 50 MG tablet Take 1 tablet (50 mg total) by mouth every 6 (six) hours as needed. (Patient not taking: Reported on 04/30/2014) 15 tablet 0   No current facility-administered medications on file prior to visit.   History reviewed. No pertinent family history. History   Social History  . Marital Status: Married    Spouse Name: N/A    Number of Children: N/A  . Years of Education: N/A   Occupational History  . Not on file.   Social History Main Topics  . Smoking status: Never Smoker   . Smokeless tobacco: Not on file   . Alcohol Use: No  . Drug Use: Not on file  . Sexual Activity: Not on file   Other Topics Concern  . Not on file   Social History Narrative    Review of Systems  Constitutional: Negative for fever, chills and weight loss.  HENT:       Rhinitis resolved   Respiratory: Positive for cough. Negative for sputum production.   Cardiovascular: Negative.   Neurological: Negative for weakness.  All other systems reviewed and are negative.    Objective:   Filed Vitals:   04/30/14 1027  BP: 134/83  Pulse: 61  Temp: 97.5 F (36.4 C)  Resp: 16    Physical Exam  Constitutional: She is oriented to person, place, and time.  Cardiovascular: Normal rate, regular rhythm, normal heart sounds and intact distal pulses.   Pulmonary/Chest: Effort normal and breath sounds normal.  Abdominal: Soft. Bowel sounds are normal.  Musculoskeletal: Normal range of motion. She exhibits no edema or tenderness.  Neurological: She is alert and oriented to person, place, and time.  Skin: Skin is warm and dry.   .  Lab Results  Component Value Date   HGB 15.6* 01/03/2014   HCT 46.0 01/03/2014   Lab Results  Component Value Date   CREATININE 0.90 01/03/2014   BUN 21 01/03/2014   NA 138 01/03/2014   K  4.4 01/03/2014   CL 107 01/03/2014    No results found for: HGBA1C Lipid Panel  No results found for: CHOL, TRIG, HDL, CHOLHDL, VLDL, LDLCALC     Assessment and plan:   Jamie Mitchell was seen today for follow-up.  Diagnoses and associated orders for this visit:  Hypothyroidism, unspecified hypothyroidism type - TSH; Future - T4, Free; Future Will send refills when TSH returns   Essential hypertension - Lipid panel; Future - COMPLETE METABOLIC PANEL WITH GFR; Future - Hemoglobin A1c; Future - hydrochlorothiazide (HYDRODIURIL) 25 MG tablet; Take 1 tablet (25 mg total) by mouth daily. spanish Patient blood pressure is stable and may continue on current medication.  Education on diet,  exercise, and modifiable risk factors discussed. Will obtain appropriate labs as needed. Will follow up in 3-6 months.   Primary osteoarthritis of left knee - traMADol (ULTRAM) 50 MG tablet; Take 1 tablet (50 mg total) by mouth every 12 (twelve) hours as needed.  Cough - guaiFENesin (MUCINEX) 600 MG 12 hr tablet; Take 1 tablet (600 mg total) by mouth 2 (two) times daily as needed. cough    Return in about 1 day (around 05/01/2014) for Lab Visit and 6 mo PCP.       Chari Manning, [place] [facility] [phone] 04/30/2014, 11:00 AM

## 2014-04-30 NOTE — Progress Notes (Signed)
Pt is here following up on her HTN and thyroid disease.  Pt needs her medicaitons refilled. Pt has occasional left knee pain. For 5 days pt has been having flu like symptoms.

## 2014-05-01 ENCOUNTER — Ambulatory Visit: Payer: Self-pay | Attending: Internal Medicine

## 2014-05-01 DIAGNOSIS — I10 Essential (primary) hypertension: Secondary | ICD-10-CM

## 2014-05-01 DIAGNOSIS — E039 Hypothyroidism, unspecified: Secondary | ICD-10-CM

## 2014-05-01 LAB — COMPLETE METABOLIC PANEL WITH GFR
ALBUMIN: 4.2 g/dL (ref 3.5–5.2)
ALK PHOS: 81 U/L (ref 39–117)
ALT: 15 U/L (ref 0–35)
AST: 21 U/L (ref 0–37)
BUN: 17 mg/dL (ref 6–23)
CALCIUM: 9.4 mg/dL (ref 8.4–10.5)
CO2: 25 mEq/L (ref 19–32)
CREATININE: 0.88 mg/dL (ref 0.50–1.10)
Chloride: 101 mEq/L (ref 96–112)
GFR, Est African American: 79 mL/min
GFR, Est Non African American: 69 mL/min
Glucose, Bld: 111 mg/dL — ABNORMAL HIGH (ref 70–99)
POTASSIUM: 4.7 meq/L (ref 3.5–5.3)
Sodium: 138 mEq/L (ref 135–145)
TOTAL PROTEIN: 7.9 g/dL (ref 6.0–8.3)
Total Bilirubin: 0.4 mg/dL (ref 0.2–1.2)

## 2014-05-01 LAB — HEMOGLOBIN A1C
Hgb A1c MFr Bld: 6.4 % — ABNORMAL HIGH (ref <5.7)
Mean Plasma Glucose: 137 mg/dL — ABNORMAL HIGH (ref <117)

## 2014-05-01 LAB — TSH: TSH: 0.644 u[IU]/mL (ref 0.350–4.500)

## 2014-05-01 LAB — LIPID PANEL
CHOLESTEROL: 251 mg/dL — AB (ref 0–200)
HDL: 36 mg/dL — ABNORMAL LOW (ref >39)
Total CHOL/HDL Ratio: 7 Ratio
Triglycerides: 403 mg/dL — ABNORMAL HIGH (ref <150)

## 2014-05-01 LAB — T4, FREE: Free T4: 1.01 ng/dL (ref 0.80–1.80)

## 2014-05-03 ENCOUNTER — Telehealth: Payer: Self-pay | Admitting: *Deleted

## 2014-05-03 ENCOUNTER — Other Ambulatory Visit: Payer: Self-pay | Admitting: *Deleted

## 2014-05-03 DIAGNOSIS — E039 Hypothyroidism, unspecified: Secondary | ICD-10-CM

## 2014-05-03 DIAGNOSIS — B009 Herpesviral infection, unspecified: Secondary | ICD-10-CM

## 2014-05-03 MED ORDER — ATORVASTATIN CALCIUM 20 MG PO TABS: 20.0000 mg | ORAL_TABLET | Freq: Every day | ORAL | Status: DC

## 2014-05-03 MED ORDER — LEVOTHYROXINE SODIUM 75 MCG PO TABS: 75.0000 ug | ORAL_TABLET | Freq: Every day | ORAL | Status: DC

## 2014-05-03 NOTE — Telephone Encounter (Signed)
Pt aware of lab results RX Atorvastatin and Synthroid was e-scribe to [facility] pharmacy

## 2014-05-03 NOTE — Telephone Encounter (Signed)
-----   Message from Lance Bosch, NP sent at 05/03/2014  8:11 AM EST ----- Patient TSH is stable, please send refill of current dose of synthroid. Cholesterol is really elevated, please stress the need for Atorvastatin 20 mg QD, send 30 tablets with 6 refills.  Explain specific diet and exercise changes. Patient is right at criteria for diagnosis of diabetes.  It is very important for her to make changes to avoid beginning medication need.  I will recheck her in 6 months when she returns from [place].

## 2014-08-10 ENCOUNTER — Telehealth: Payer: Self-pay | Admitting: Internal Medicine

## 2014-08-10 NOTE — Telephone Encounter (Signed)
Patient's family member has come in to request all medications on behalf of patient who had to travel out of the country; patient would like to refill all medication with the exception of the pain medication; please f/u as patient is completely out

## 2014-08-14 ENCOUNTER — Telehealth: Payer: Self-pay | Admitting: Internal Medicine

## 2014-08-14 NOTE — Telephone Encounter (Signed)
Pt's family member came into office to check the status of medication refill for all medication. Please f/u with pt if medication is approved

## 2014-08-15 ENCOUNTER — Telehealth: Payer: Self-pay | Admitting: Internal Medicine

## 2014-08-15 NOTE — Telephone Encounter (Signed)
Patients daughter in law is calling to request a med refill for all of the patients medications, she states that she needs to send them over to [place] since the patient is currently out of state. Please f/u with pt.

## 2014-08-20 ENCOUNTER — Telehealth: Payer: Self-pay | Admitting: Internal Medicine

## 2014-08-20 NOTE — Telephone Encounter (Signed)
Pt needs a refills for her blood pressure, thyroid, pain in knee and cholesterol. Pt is out of the country. Please follow up with patient to let her know if this is possible.

## 2014-08-20 NOTE — Telephone Encounter (Signed)
Patients daughter in law is calling to check on the request of  a med refill for all of the patients medications, she states that she needs to send them over to [place] since the patient is currently out of state. Please f/u with pt.

## 2014-08-22 NOTE — Telephone Encounter (Signed)
Pt has refills. Need to call pharmacy

## 2014-09-11 ENCOUNTER — Ambulatory Visit: Payer: Self-pay

## 2014-09-11 ENCOUNTER — Ambulatory Visit: Payer: Self-pay | Attending: Internal Medicine | Admitting: Internal Medicine

## 2014-09-11 ENCOUNTER — Encounter: Payer: Self-pay | Admitting: Internal Medicine

## 2014-09-11 VITALS — BP 128/81 | HR 75 | Temp 98.3°F | Resp 16 | Ht 61.0 in | Wt 148.0 lb

## 2014-09-11 DIAGNOSIS — I10 Essential (primary) hypertension: Secondary | ICD-10-CM | POA: Insufficient documentation

## 2014-09-11 DIAGNOSIS — E785 Hyperlipidemia, unspecified: Secondary | ICD-10-CM | POA: Insufficient documentation

## 2014-09-11 DIAGNOSIS — Z23 Encounter for immunization: Secondary | ICD-10-CM | POA: Insufficient documentation

## 2014-09-11 DIAGNOSIS — E039 Hypothyroidism, unspecified: Secondary | ICD-10-CM | POA: Insufficient documentation

## 2014-09-11 DIAGNOSIS — M1712 Unilateral primary osteoarthritis, left knee: Secondary | ICD-10-CM | POA: Insufficient documentation

## 2014-09-11 MED ORDER — HYDROCHLOROTHIAZIDE 25 MG PO TABS: 25.0000 mg | ORAL_TABLET | Freq: Every day | ORAL | Status: DC

## 2014-09-11 MED ORDER — TRAMADOL HCL 50 MG PO TABS: 50.0000 mg | ORAL_TABLET | Freq: Two times a day (BID) | ORAL | Status: DC | PRN

## 2014-09-11 MED ORDER — ATORVASTATIN CALCIUM 20 MG PO TABS: 20.0000 mg | ORAL_TABLET | Freq: Every day | ORAL | Status: DC

## 2014-09-11 MED ORDER — LEVOTHYROXINE SODIUM 75 MCG PO TABS: 75.0000 ug | ORAL_TABLET | Freq: Every day | ORAL | Status: DC

## 2014-09-11 NOTE — Patient Instructions (Signed)
Please call Jamie Mitchell, [phone],  with the BCCCP (breast and cervical cancer control program) at the [facility] to set up an appointment to verify eligibility for a breast exam, mammogram, ultrasound. If you qualify this will be set up at [facility].  Once you get orange card will put in for colonoscopy

## 2014-09-11 NOTE — Progress Notes (Signed)
Pt comes in for wellness visit Requesting medication refills on all medications Denies pain today Due for Colonoscopy,mammogram Negative pap smear done in [place]

## 2014-09-11 NOTE — Progress Notes (Signed)
Patient ID: Jamie Mitchell, female   DOB: [birth year], 69 y.o.   MRN: [id number]  CC:  HPI: Jamie Mitchell is a 69 y.o. female here today for a follow up visit.  Patient has past medical history of of hypertension and thyroid disease.  She reports that she has been out of [place] and has been calling for medication refills.  She has been unable to speak with someone about refills. She would like all medication refilled including pain medication. She denies pain today but would like to have it in the event that her knees begin to give her problems again.  She is in need of a mammogram and colonoscopy.     Patient has No headache, No chest pain, No abdominal pain - No Nausea, No new weakness tingling or numbness, No Cough - SOB.  No Known Allergies Past Medical History  Diagnosis Date  . Hypertension   . Thyroid disease    Current Outpatient Prescriptions on File Prior to Visit  Medication Sig Dispense Refill  . atorvastatin (LIPITOR) 20 MG tablet Take 1 tablet (20 mg total) by mouth daily. 90 tablet 1  . hydrochlorothiazide (HYDRODIURIL) 25 MG tablet Take 1 tablet (25 mg total) by mouth daily. spanish 60 tablet 3  . levothyroxine (SYNTHROID, LEVOTHROID) 75 MCG tablet Take 1 tablet (75 mcg total) by mouth daily. Spanish 90 tablet 1  . traMADol (ULTRAM) 50 MG tablet Take 1 tablet (50 mg total) by mouth every 12 (twelve) hours as needed. 60 tablet 1  . acyclovir (ZOVIRAX) 400 MG tablet Take 1 tablet (400 mg total) by mouth 2 (two) times daily. (Patient not taking: Reported on 04/30/2014) 14 tablet 0  . aspirin 81 MG tablet Take 81 mg by mouth daily.    [name] guaiFENesin (MUCINEX) 600 MG 12 hr tablet Take 1 tablet (600 mg total) by mouth 2 (two) times daily as needed. cough (Patient not taking: Reported on 09/11/2014) 60 tablet 0   No current facility-administered medications on file prior to visit.   History reviewed. No pertinent family history. History   Social History  .  Marital Status: Married    Spouse Name: N/A  . Number of Children: N/A  . Years of Education: N/A   Occupational History  . Not on file.   Social History Main Topics  . Smoking status: Never Smoker   . Smokeless tobacco: Not on file  . Alcohol Use: No  . Drug Use: Not on file  . Sexual Activity: Not on file   Other Topics Concern  . Not on file   Social History Narrative    Review of Systems: See HPI   Objective:   Filed Vitals:   09/11/14 1706  BP: 128/81  Pulse: 75  Temp: 98.3 F (36.8 C)  Resp: 16    Physical Exam  Constitutional: She is oriented to person, place, and time.  Cardiovascular: Normal rate, regular rhythm and normal heart sounds.   Pulmonary/Chest: Effort normal and breath sounds normal.  Musculoskeletal: Normal range of motion.  Neurological: She is alert and oriented to person, place, and time.     Lab Results  Component Value Date   HGB 15.6* 01/03/2014   HCT 46.0 01/03/2014   Lab Results  Component Value Date   CREATININE 0.88 05/01/2014   BUN 17 05/01/2014   NA 138 05/01/2014   K 4.7 05/01/2014   CL 101 05/01/2014   CO2 25 05/01/2014    Lab Results  Component Value Date  HGBA1C 6.4* 05/01/2014   Lipid Panel     Component Value Date/Time   CHOL 251* 05/01/2014 0914   TRIG 403* 05/01/2014 0914   HDL 36* 05/01/2014 0914   CHOLHDL 7.0 05/01/2014 0914   VLDL NOT CALC 05/01/2014 0914   LDLCALC NOT CALC 05/01/2014 0914       Assessment and plan:   Jamie Mitchell was seen today for follow-up and medication refill.  Diagnoses and all orders for this visit:  Hypothyroidism, unspecified hypothyroidism type Orders: -    refilled levothyroxine (SYNTHROID, LEVOTHROID) 75 MCG tablet; Take 1 tablet (75 mcg total) by mouth daily. Spanish Will recheck levels in July  Essential hypertension Orders: -    Refill hydrochlorothiazide (HYDRODIURIL) 25 MG tablet; Take 1 tablet (25 mg total) by mouth daily. spanish Repeat bmet in  July  HLD (hyperlipidemia) Orders: -     atorvastatin (LIPITOR) 20 MG tablet; Take 1 tablet (20 mg total) by mouth daily. Repeat lipid July  Primary osteoarthritis of left knee Orders: -    Refill traMADol (ULTRAM) 50 MG tablet; Take 1 tablet (50 mg total) by mouth every 12 (twelve) hours as needed. May possibly need ortho for injections  Other orders -     Pneumococcal polysaccharide vaccine 23-valent greater than or equal to 2yo subcutaneous/IM   Return for July for repeat blood PCP.  Due to language barrier, an interpreter was present during the history-taking and subsequent discussion (and for part of the physical exam) with this patient.         Chari Manning, NP-C [facility] [phone] 09/11/2014, 5:20 PM

## 2015-02-12 ENCOUNTER — Other Ambulatory Visit: Payer: Self-pay | Admitting: Internal Medicine

## 2015-07-23 ENCOUNTER — Encounter: Payer: Self-pay | Admitting: Family Medicine

## 2015-07-23 ENCOUNTER — Ambulatory Visit: Payer: Self-pay

## 2015-07-23 ENCOUNTER — Ambulatory Visit: Payer: Self-pay | Attending: Internal Medicine | Admitting: Family Medicine

## 2015-07-23 VITALS — BP 124/75 | HR 73 | Temp 98.0°F | Resp 15 | Ht 61.0 in | Wt 145.8 lb

## 2015-07-23 DIAGNOSIS — E038 Other specified hypothyroidism: Secondary | ICD-10-CM

## 2015-07-23 DIAGNOSIS — I10 Essential (primary) hypertension: Secondary | ICD-10-CM | POA: Insufficient documentation

## 2015-07-23 DIAGNOSIS — Z23 Encounter for immunization: Secondary | ICD-10-CM | POA: Insufficient documentation

## 2015-07-23 DIAGNOSIS — Z7982 Long term (current) use of aspirin: Secondary | ICD-10-CM | POA: Insufficient documentation

## 2015-07-23 DIAGNOSIS — E785 Hyperlipidemia, unspecified: Secondary | ICD-10-CM | POA: Insufficient documentation

## 2015-07-23 DIAGNOSIS — E039 Hypothyroidism, unspecified: Secondary | ICD-10-CM | POA: Insufficient documentation

## 2015-07-23 DIAGNOSIS — M1712 Unilateral primary osteoarthritis, left knee: Secondary | ICD-10-CM | POA: Insufficient documentation

## 2015-07-23 DIAGNOSIS — Z79899 Other long term (current) drug therapy: Secondary | ICD-10-CM | POA: Insufficient documentation

## 2015-07-23 DIAGNOSIS — Z0000 Encounter for general adult medical examination without abnormal findings: Secondary | ICD-10-CM

## 2015-07-23 DIAGNOSIS — Z760 Encounter for issue of repeat prescription: Secondary | ICD-10-CM | POA: Insufficient documentation

## 2015-07-23 DIAGNOSIS — R600 Localized edema: Secondary | ICD-10-CM | POA: Insufficient documentation

## 2015-07-23 MED ORDER — LEVOTHYROXINE SODIUM 75 MCG PO TABS: 75.0000 ug | ORAL_TABLET | Freq: Every day | ORAL | Status: DC

## 2015-07-23 MED ORDER — HYDROCHLOROTHIAZIDE 25 MG PO TABS: 25.0000 mg | ORAL_TABLET | Freq: Every day | ORAL | Status: DC

## 2015-07-23 MED ORDER — NAPROXEN 500 MG PO TABS: 500.0000 mg | ORAL_TABLET | Freq: Two times a day (BID) | ORAL | Status: DC

## 2015-07-23 MED ORDER — ATORVASTATIN CALCIUM 20 MG PO TABS: 20.0000 mg | ORAL_TABLET | Freq: Every day | ORAL | Status: DC

## 2015-07-23 NOTE — Progress Notes (Signed)
Subjective:  Patient ID: Jamie Mitchell, female    DOB: 1945-10-19  Age: 70 y.o. MRN: [id number]  CC: Medication Refill   HPI Jamie Mitchell is a  70 year old female with a history of hypothyroidism, hyperlipidemia, hypertension who was last seen in the clinic 10 months ago due to the fact that she was out of the country and recently returned from [place].  She comes into the clinic requesting refills of her Naproxen and complains of left knee pain which is chronic, associated with left knee edema and pain is worsening. Left knee x-ray from 12/2013 revealed degenerative joint disease of the left knee.  She is accompanied by her daughter today and is also requesting medications for her hypertension, hypothyroidism and hyperlipidemia. Her last TSH was normal in 04/2014. She has no other complaints at this time.   Outpatient Prescriptions Prior to Visit  Medication Sig Dispense Refill  . aspirin 81 MG tablet Take 81 mg by mouth daily.    [name] atorvastatin (LIPITOR) 20 MG tablet TAKE 1 TABLET BY MOUTH DAILY 30 tablet 5  . hydrochlorothiazide (HYDRODIURIL) 25 MG tablet TAKE 1 TABLET BY MOUTH DAILY 30 tablet 5  . SYNTHROID 75 MCG tablet TAKE 1 TABLET BY MOUTH DAILY 30 tablet 5  . acyclovir (ZOVIRAX) 400 MG tablet Take 1 tablet (400 mg total) by mouth 2 (two) times daily. (Patient not taking: Reported on 04/30/2014) 14 tablet 0  . guaiFENesin (MUCINEX) 600 MG 12 hr tablet Take 1 tablet (600 mg total) by mouth 2 (two) times daily as needed. cough (Patient not taking: Reported on 09/11/2014) 60 tablet 0  . traMADol (ULTRAM) 50 MG tablet Take 1 tablet (50 mg total) by mouth every 12 (twelve) hours as needed. (Patient not taking: Reported on 07/23/2015) 60 tablet 0   No facility-administered medications prior to visit.    ROS Review of Systems  Constitutional: Negative for activity change, appetite change and fatigue.  HENT: Negative for congestion, sinus pressure and sore throat.    Eyes: Negative for visual disturbance.  Respiratory: Negative for cough, chest tightness, shortness of breath and wheezing.   Cardiovascular: Negative for chest pain and palpitations.  Gastrointestinal: Negative for abdominal pain, constipation and abdominal distention.  Endocrine: Negative for polydipsia.  Genitourinary: Negative for dysuria and frequency.  Musculoskeletal: Positive for joint swelling (Left knee pain and swelling). Negative for back pain and arthralgias.  Skin: Negative for rash.  Neurological: Negative for tremors, light-headedness and numbness.  Hematological: Does not bruise/bleed easily.  Psychiatric/Behavioral: Negative for behavioral problems and agitation.    Objective:  BP 124/75 mmHg  Pulse 73  Temp(Src) 98 F (36.7 C)  Resp 15  Ht 5\' 1"  (1.549 m)  Wt 145 lb 12.8 oz (66.134 kg)  BMI 27.56 kg/m2  SpO2 94%  BP/Weight 07/23/2015 09/11/2014 [id number]  Systolic BP A999333 0000000 [id number]  Diastolic BP 75 81 83  Wt. (Lbs) 145.8 148 158  BMI 27.56 27.98 30.86      Physical Exam  Constitutional: She is oriented to person, place, and time. She appears well-developed and well-nourished.  Cardiovascular: Normal rate, normal heart sounds and intact distal pulses.   No murmur heard. Pulmonary/Chest: Effort normal and breath sounds normal. She has no wheezes. She has no rales. She exhibits no tenderness.  Abdominal: Soft. Bowel sounds are normal. She exhibits no distension and no mass. There is no tenderness.  Musculoskeletal: She exhibits edema (Left knee edema) and tenderness (Left knee tenderness and range of motion).  Neurological: She is alert and oriented to person, place, and time.   CLINICAL DATA: Left knee pain.  EXAM: DG KNEE - 3 VIEWS  COMPARISON: None.  FINDINGS: The bones appear mildly demineralized. There is moderate joint space loss and osteophyte formation in the medial compartment. There is mild lateral and patellofemoral compartment  osteophyte formation. No acute fracture, dislocation or significant joint effusion is seen.  IMPRESSION: Degenerative changes as described, most advanced within the medial compartment. No acute osseous findings.   Electronically Signed  By: Camie Patience M.D.  On: 01/03/2014 14:43  Assessment & Plan:   1. Healthcare maintenance - Flu Vaccine QUAD 36+ mos PF IM (Fluarix & Fluzone Quad PF)  2. Primary osteoarthritis of left knee Advised to use knee brace, apply ice. We'll see PCP for reassessment at next visit and referral to orthopedics if symptoms still persist - naproxen (NAPROSYN) 500 MG tablet; Take 1 tablet (500 mg total) by mouth 2 (two) times daily with a meal.  Dispense: 60 tablet; Refill: 3  3. Other specified hypothyroidism TSH sent off this last TSH was from 04/2014 - levothyroxine (SYNTHROID) 75 MCG tablet; Take 1 tablet (75 mcg total) by mouth daily.  Dispense: 30 tablet; Refill: 3  4. Essential hypertension Controlled Low-sodium diet - hydrochlorothiazide (HYDRODIURIL) 25 MG tablet; Take 1 tablet (25 mg total) by mouth daily.  Dispense: 30 tablet; Refill: 3 - COMPLETE METABOLIC PANEL WITH GFR; Future - Lipid panel; Future - TSH; Future  5. Hyperlipidemia Lipid panel sent off Low-cholesterol diet - atorvastatin (LIPITOR) 20 MG tablet; Take 1 tablet (20 mg total) by mouth daily.  Dispense: 30 tablet; Refill: 3   Meds ordered this encounter  Medications  . atorvastatin (LIPITOR) 20 MG tablet    Sig: Take 1 tablet (20 mg total) by mouth daily.    Dispense:  30 tablet    Refill:  3  . naproxen (NAPROSYN) 500 MG tablet    Sig: Take 1 tablet (500 mg total) by mouth 2 (two) times daily with a meal.    Dispense:  60 tablet    Refill:  3  . hydrochlorothiazide (HYDRODIURIL) 25 MG tablet    Sig: Take 1 tablet (25 mg total) by mouth daily.    Dispense:  30 tablet    Refill:  3  . levothyroxine (SYNTHROID) 75 MCG tablet    Sig: Take 1 tablet (75 mcg total)  by mouth daily.    Dispense:  30 tablet    Refill:  3    Follow-up: Return in about 1 week (around 07/30/2015) for follow up of knee pain with PCP- Mateo Flow.   Arnoldo Morale MD

## 2015-07-23 NOTE — Progress Notes (Signed)
Patient has chronic left knee pain that she takes naprosyn for She is here for refills on medications

## 2015-07-23 NOTE — Patient Instructions (Signed)
Artritis (Arthritis) El trmino artritis se [place] comnmente para hacer referencia al dolor de las articulaciones o a la enfermedad articular. Hay ms de 100tipos de artritis. CAUSAS La causa ms frecuente de esta afeccin es el desgaste de una articulacin. Algunas otras causas son las siguientes:  Gota.  Inflamacin de una articulacin.  Una infeccin de Insurance claims handler.  Esguinces y otras lesiones cerca de la articulacin.  Una reaccin farmacolgica o alrgica. En algunos casos, es posible que la causa no se conozca. SNTOMAS El sntoma principal de esta afeccin es el dolor de la articulacin con el movimiento. Otros sntomas pueden ser los siguientes:  Enrojecimiento, hinchazn o rigidez de Insurance claims handler.  Calor que emana de Water engineer.  Cristy Hilts.  Sensacin generalizada de estar enfermo. DIAGNSTICO Esta afeccin se puede diagnosticar mediante un examen fsico y Arvid Right ellos:  Anlisis de [place].  Anlisis de [place].  Estudios de diagnstico por imgenes, como una resonancia magntica (RM), radiografas o una tomografa computarizada (TC). A veces, se extrae lquido de una articulacin para analizarlo. TRATAMIENTO El tratamiento de esta afeccin puede incluir lo siguiente:  El tratamiento de la causa, si se conoce.  Reposo.  Mantener elevada la articulacin.  Aplicar compresas fras o calientes en la articulacin.  Medicamentos para [employer] sntomas y Risk manager.  Inyecciones de un corticoide, como cortisona, en la articulacin para ayudar a Best boy y Risk manager. Segn la causa de la artritis, tal vez haya que hacer cambios en el estilo de vida para reducir la carga sobre la articulacin. Algunos de los cambios incluyen realizar ms actividad fsica y Sports coach de [place]acia [place], [employer]. [place] los medicamentos de venta libre y los recetados solamente como se lo  haya indicado el mdico.  No tome aspirina para Theatre stage manager dolor si se sospecha la presencia de [place]. Actividades  Ponga en reposo la articulacin como se lo haya indicado el mdico. El reposo es importante cuando la enfermedad est activa y la articulacin le duele, est hinchada o rgida.  Evite las actividades que intensifiquen Conservation officer, historic buildings. Es importante Paramedic equilibrio entre la actividad y el reposo.  Con frecuencia, realice ejercicios de flexibilidad articular como se lo haya indicado el mdico. Intente realizar ejercicios de bajo impacto, por ejemplo:  Natacin.  Gimnasia acutica.  Andar en bicicleta.  Caminar. Cuidado de la articulacin  Si la articulacin se le hincha, mantngala elevada como se lo haya indicado el mdico.  Si al despertar por la maana, nota que la articulacin est rgida, intente tomar una ducha con agua tibia.  Si se lo indican, pngase calor en la articulacin. Si es diabtico, no se aplique calor sin la autorizacin del mdico.  Coloque una toalla entre la articulacin y la compresa caliente o la almohadilla trmica.  Coloque el calor en la zona durante 20 o 12minutos.  Si se lo indican, pngase hielo en la articulacin:  Ponga el hielo en una bolsa plstica.  Coloque una toalla entre la piel y la bolsa de hielo.  Coloque el hielo durante 69minutos, 2 a 3veces por Training and development officer.  Concurra a todas las visitas de control como se lo haya indicado el mdico. Esto es importante. SOLICITE ATENCIN MDICA SI:  El dolor empeora.  Tiene fiebre. SOLICITE ATENCIN MDICA DE INMEDIATO SI:  Siente dolor, u observa hinchazn o enrojecimiento en la articulacin.  Siente dolor en muchas articulaciones y se le hinchan.  Siente un dolor  intenso en la espalda.  Tiene mucha debilidad en la pierna.  No puede controlar los intestinos o la vejiga.   Esta informacin no tiene Marine scientist el consejo del mdico. Asegrese de hacerle al mdico  cualquier pregunta que tenga.   Document Released: 05/11/2005 Document Revised: 01/30/2015 Elsevier Interactive Patient Education [employer].

## 2015-07-24 ENCOUNTER — Ambulatory Visit: Payer: Self-pay | Attending: Internal Medicine

## 2015-07-24 DIAGNOSIS — E038 Other specified hypothyroidism: Secondary | ICD-10-CM

## 2015-07-24 DIAGNOSIS — I10 Essential (primary) hypertension: Secondary | ICD-10-CM

## 2015-07-24 LAB — COMPLETE METABOLIC PANEL WITH GFR
ALBUMIN: 3.8 g/dL (ref 3.6–5.1)
ALK PHOS: 77 U/L (ref 33–130)
ALT: 15 U/L (ref 6–29)
AST: 20 U/L (ref 10–35)
BILIRUBIN TOTAL: 0.8 mg/dL (ref 0.2–1.2)
BUN: 16 mg/dL (ref 7–25)
CALCIUM: 8.9 mg/dL (ref 8.6–10.4)
CO2: 26 mmol/L (ref 20–31)
Chloride: 103 mmol/L (ref 98–110)
Creat: 0.8 mg/dL (ref 0.50–0.99)
GFR, Est African American: 88 mL/min (ref >=60)
GFR, Est Non African American: 77 mL/min (ref >=60)
GLUCOSE: 104 mg/dL — AB (ref 65–99)
Potassium: 4.1 mmol/L (ref 3.5–5.3)
SODIUM: 138 mmol/L (ref 135–146)
Total Protein: 7.1 g/dL (ref 6.1–8.1)

## 2015-07-24 LAB — LIPID PANEL
CHOL/HDL RATIO: 4.8 ratio (ref <=5.0)
Cholesterol: 133 mg/dL (ref 125–200)
HDL: 28 mg/dL — AB (ref >=46)
LDL CALC: 49 mg/dL (ref <130)
TRIGLYCERIDES: 280 mg/dL — AB (ref <150)
VLDL: 56 mg/dL — ABNORMAL HIGH (ref <30)

## 2015-07-24 LAB — TSH: TSH: 0.08 mIU/L — ABNORMAL LOW

## 2015-07-25 ENCOUNTER — Telehealth: Payer: Self-pay | Admitting: *Deleted

## 2015-07-25 ENCOUNTER — Other Ambulatory Visit: Payer: Self-pay | Admitting: Family Medicine

## 2015-07-25 DIAGNOSIS — E038 Other specified hypothyroidism: Secondary | ICD-10-CM

## 2015-07-25 MED ORDER — LEVOTHYROXINE SODIUM 50 MCG PO TABS: 50.0000 ug | ORAL_TABLET | Freq: Every day | ORAL | Status: DC

## 2015-07-25 NOTE — Telephone Encounter (Signed)
-----   Message from Arnoldo Morale, MD sent at 07/25/2015  8:28 AM EST ----- Labs reveal a suppressed TSH tending towards hyperthyroidism and so I have decreased the dose of levothyroxine from 75 to 25mcg; total chol is normal but triglycerides are elevated. Advised to use OTC omega 3 fatty acids, HDL is low- advise to increase physical activity as tolerated.

## 2015-07-25 NOTE — Telephone Encounter (Signed)
[place] interpreter  Verified name and date of birth and gave results.

## 2015-07-25 NOTE — Telephone Encounter (Signed)
Pt. Returned call. Please f/u with pt. °

## 2015-07-25 NOTE — Telephone Encounter (Signed)
PI id# [id number]  Interpreter left message that RN would try again

## 2015-07-29 ENCOUNTER — Ambulatory Visit: Payer: Self-pay

## 2015-07-31 ENCOUNTER — Encounter: Payer: Self-pay | Admitting: Internal Medicine

## 2015-07-31 ENCOUNTER — Ambulatory Visit: Payer: Self-pay | Attending: Internal Medicine | Admitting: Internal Medicine

## 2015-07-31 VITALS — BP 115/77 | HR 81 | Temp 98.4°F | Resp 15 | Ht 61.0 in | Wt 145.6 lb

## 2015-07-31 DIAGNOSIS — Z79899 Other long term (current) drug therapy: Secondary | ICD-10-CM | POA: Insufficient documentation

## 2015-07-31 DIAGNOSIS — Z131 Encounter for screening for diabetes mellitus: Secondary | ICD-10-CM | POA: Insufficient documentation

## 2015-07-31 DIAGNOSIS — M25462 Effusion, left knee: Secondary | ICD-10-CM | POA: Insufficient documentation

## 2015-07-31 DIAGNOSIS — I10 Essential (primary) hypertension: Secondary | ICD-10-CM | POA: Insufficient documentation

## 2015-07-31 DIAGNOSIS — M25562 Pain in left knee: Secondary | ICD-10-CM | POA: Insufficient documentation

## 2015-07-31 DIAGNOSIS — Z7982 Long term (current) use of aspirin: Secondary | ICD-10-CM | POA: Insufficient documentation

## 2015-07-31 DIAGNOSIS — E079 Disorder of thyroid, unspecified: Secondary | ICD-10-CM | POA: Insufficient documentation

## 2015-07-31 LAB — POCT GLYCOSYLATED HEMOGLOBIN (HGB A1C): HEMOGLOBIN A1C: 6.3

## 2015-07-31 MED ORDER — TRAMADOL HCL 50 MG PO TABS: 50.0000 mg | ORAL_TABLET | Freq: Two times a day (BID) | ORAL | Status: DC | PRN

## 2015-07-31 NOTE — Progress Notes (Signed)
Patient ID: Jamie Mitchell, female   DOB: July 14, 1945, 70 y.o.   MRN: [id number]  CC: left knee pain  HPI: Jamie Mitchell is a 70 y.o. female here today for a follow up visit.  Patient has past medical history of HTN and thyroid disease. Patient complains of left knee pain for several months with swelling. She has a history of knee osteoarthritis but has not been evaluated by a Orthopedics due to lack of insurance.  She states that her knee has been very swollen causing edema in her left ankle. She denies calf pain, redness, warmth, SOB, chest pain, palpitations.   No Known Allergies Past Medical History  Diagnosis Date  . Hypertension   . Thyroid disease    Current Outpatient Prescriptions on File Prior to Visit  Medication Sig Dispense Refill  . acyclovir (ZOVIRAX) 400 MG tablet Take 1 tablet (400 mg total) by mouth 2 (two) times daily. (Patient not taking: Reported on 04/30/2014) 14 tablet 0  . aspirin 81 MG tablet Take 81 mg by mouth daily.    [name] atorvastatin (LIPITOR) 20 MG tablet Take 1 tablet (20 mg total) by mouth daily. 30 tablet 3  . guaiFENesin (MUCINEX) 600 MG 12 hr tablet Take 1 tablet (600 mg total) by mouth 2 (two) times daily as needed. cough (Patient not taking: Reported on 09/11/2014) 60 tablet 0  . hydrochlorothiazide (HYDRODIURIL) 25 MG tablet Take 1 tablet (25 mg total) by mouth daily. 30 tablet 3  . levothyroxine (SYNTHROID, LEVOTHROID) 50 MCG tablet Take 1 tablet (50 mcg total) by mouth daily. 30 tablet 2  . naproxen (NAPROSYN) 500 MG tablet Take 1 tablet (500 mg total) by mouth 2 (two) times daily with a meal. 60 tablet 3  . traMADol (ULTRAM) 50 MG tablet Take 1 tablet (50 mg total) by mouth every 12 (twelve) hours as needed. (Patient not taking: Reported on 07/23/2015) 60 tablet 0   No current facility-administered medications on file prior to visit.   No family history on file. Social History   Social History  . Marital Status: Married    Spouse  Name: N/A  . Number of Children: N/A  . Years of Education: N/A   Occupational History  . Not on file.   Social History Main Topics  . Smoking status: Never Smoker   . Smokeless tobacco: Not on file  . Alcohol Use: No  . Drug Use: No  . Sexual Activity: Not on file   Other Topics Concern  . Not on file   Social History Narrative    Review of Systems: Other than what is stated in HPI, all other systems are negative.   Objective:   Filed Vitals:   07/31/15 1518  BP: 115/77  Pulse: 81  Temp: 98.4 F (36.9 C)  Resp: 15    Physical Exam  Constitutional: She is oriented to person, place, and time.  Cardiovascular: Normal rate, regular rhythm and normal heart sounds.   Pulmonary/Chest: Effort normal and breath sounds normal.  Musculoskeletal: Normal range of motion. She exhibits tenderness (left knee swelling and tenderness).  Neurological: She is alert and oriented to person, place, and time.  Skin: Skin is warm and dry.  Psychiatric: She has a normal mood and affect.     Lab Results  Component Value Date   HGB 15.6* 01/03/2014   HCT 46.0 01/03/2014   Lab Results  Component Value Date   CREATININE 0.80 07/24/2015   BUN 16 07/24/2015   NA 138  07/24/2015   K 4.1 07/24/2015   CL 103 07/24/2015   CO2 26 07/24/2015    Lab Results  Component Value Date   HGBA1C 6.3 07/31/2015   Lipid Panel     Component Value Date/Time   CHOL 133 07/24/2015 0947   TRIG 280* 07/24/2015 0947   HDL 28* 07/24/2015 0947   CHOLHDL 4.8 07/24/2015 0947   VLDL 56* 07/24/2015 0947   LDLCALC 49 07/24/2015 0947       Assessment and plan:   Jamie Mitchell was seen today for knee pain.  Diagnoses and all orders for this visit:  Knee effusion, left -     AMB referral to orthopedics -    Begin traMADol (ULTRAM) 50 MG tablet; Take 1 tablet (50 mg total) by mouth every 12 (twelve) hours as needed. Patient will need knee drained by Orthopedics  Diabetes mellitus screening -     HgB  A1c  Due to language barrier, an interpreter was present during the history-taking and subsequent discussion (and for part of the physical exam) with this patient.  Return in about 6 months (around 01/31/2016).       Lance Bosch, [facility] [phone] 07/31/2015, 3:19 PM

## 2015-07-31 NOTE — Progress Notes (Signed)
Patient complains of chronic left knee pain that is edematous today She wonders if she needs to be referred Oxygen 93%

## 2015-08-20 ENCOUNTER — Telehealth: Payer: Self-pay | Admitting: Internal Medicine

## 2015-08-20 DIAGNOSIS — H9190 Unspecified hearing loss, unspecified ear: Secondary | ICD-10-CM

## 2015-08-20 NOTE — Telephone Encounter (Signed)
Patient called stating she's having problems hearing and needs a referral to see an ENT. Please follow up.

## 2015-08-21 ENCOUNTER — Ambulatory Visit (INDEPENDENT_AMBULATORY_CARE_PROVIDER_SITE_OTHER): Payer: Self-pay | Admitting: Family Medicine

## 2015-08-21 ENCOUNTER — Encounter: Payer: Self-pay | Admitting: Family Medicine

## 2015-08-21 VITALS — BP 112/80 | Ht 61.0 in | Wt 145.0 lb

## 2015-08-21 DIAGNOSIS — M1712 Unilateral primary osteoarthritis, left knee: Secondary | ICD-10-CM

## 2015-08-21 MED ORDER — METHYLPREDNISOLONE ACETATE 40 MG/ML IJ SUSP
40.0000 mg | Freq: Once | INTRAMUSCULAR | Status: AC
  Administered 2015-08-21: 40 mg via INTRA_ARTICULAR

## 2015-08-21 NOTE — Assessment & Plan Note (Signed)
Most likely the cause of her ongoing knee pain.  Would consider repeating x-rays in the near future as well since these are almost two years old - Repeat injection today - Continue with medication. - Consider unloading brace, sleeve, tylenol/changing NSAID, visco shots - F/U PRN   Aspiration/Injection Procedure Note Nickolas Shoemate February 06, 1946  Procedure: Injection Indications: OA L knee  Procedure Details Consent: Risks of procedure as well as the alternatives and risks of each were explained to the (patient/caregiver).  Consent for procedure obtained. Time Out: Verified patient identification, verified procedure, site/side was marked, verified correct patient position, special equipment/implants available, medications/allergies/relevent history reviewed, required imaging and test results available.  Performed.  The area was cleaned with iodine and alcohol swabs.    The L medial knee was injected using 2 cc's of 80mg  Depomedrol and 4 cc's of 1% lidocaine with a 21 1 1/2" needle.    A sterile dressing was applied.  Patient did tolerate procedure well. Estimated blood loss: None

## 2015-08-21 NOTE — Addendum Note (Signed)
Addended by: Laurey Arrow on: 08/21/2015 04:04 PM   Modules accepted: Orders

## 2015-08-21 NOTE — Progress Notes (Signed)
   Subjective:    Patient ID: Misao Nykamp, female    DOB: Oct 15, 1945, 70 y.o.   MRN: [id number]  HPI Patient presents today for left knee pain x 2 years.  Previously had x-rays and dx of medial compartment OA primary localized.  One injection that worked for about 14 months.  Pain has increased in past 6 months now.  Medications not working but denies injury.   No instability but a lot of pain after squatting or bending and then standing.  No swelling or effusions.  No redness of the knee.  No fevers.  She hasn't had previous surgeries or procedures on the knee other than the recent injection.  Pain is worse with activity.  No pain in other joints.  Some occasional shooting sensation down her anterior leg from the knee.  Taking tramadol as well, which has helped with the pain  Review of Systems As per HPI    Objective:   Physical Exam Gen: Pleasant, NAD, well appearing, spanish speaking female  Knee: Knee intact to testing of ACL, PCL, MCL, and LCL No patella apprehension, no pain to FABER testing No effusion noted, no pain to patella motion Minor tenderness along medial and lateral joint line Minor tenderness with McMurray testing Sensation intact in bilateral lower extremities  Neuro: Alert, pleasant, oriented

## 2015-10-25 ENCOUNTER — Other Ambulatory Visit: Payer: Self-pay | Admitting: Pharmacist

## 2015-10-25 ENCOUNTER — Other Ambulatory Visit: Payer: Self-pay | Admitting: Family Medicine

## 2015-10-25 ENCOUNTER — Telehealth: Payer: Self-pay | Admitting: Internal Medicine

## 2015-10-25 DIAGNOSIS — E038 Other specified hypothyroidism: Secondary | ICD-10-CM

## 2015-10-25 DIAGNOSIS — E785 Hyperlipidemia, unspecified: Secondary | ICD-10-CM

## 2015-10-25 DIAGNOSIS — I10 Essential (primary) hypertension: Secondary | ICD-10-CM

## 2015-10-25 MED ORDER — ATORVASTATIN CALCIUM 20 MG PO TABS: 20.0000 mg | ORAL_TABLET | Freq: Every day | ORAL | Status: DC

## 2015-10-25 MED ORDER — HYDROCHLOROTHIAZIDE 25 MG PO TABS: 25.0000 mg | ORAL_TABLET | Freq: Every day | ORAL | Status: DC

## 2015-10-25 MED ORDER — LEVOTHYROXINE SODIUM 50 MCG PO TABS: 50.0000 ug | ORAL_TABLET | Freq: Every day | ORAL | Status: DC

## 2015-10-25 NOTE — Telephone Encounter (Signed)
Patient needs synthroid and hydrochlorothiazide. Patient would like to talk to the nurse. Please follow up,

## 2015-10-25 NOTE — Addendum Note (Signed)
Addended by: Nicoletta Ba A on: 10/25/2015 10:19 AM   Modules accepted: Orders

## 2015-10-25 NOTE — Telephone Encounter (Signed)
Patient is leaving town for 2 months and needs refills on her medications. Will order a 90 day supply to last patient during trip and she will schedule an appointment for follow up when she returns.

## 2015-12-04 ENCOUNTER — Other Ambulatory Visit: Payer: Self-pay | Admitting: Family Medicine

## 2016-02-07 ENCOUNTER — Other Ambulatory Visit: Payer: Self-pay | Admitting: Internal Medicine

## 2016-02-07 ENCOUNTER — Telehealth: Payer: Self-pay | Admitting: Internal Medicine

## 2016-02-07 DIAGNOSIS — M25462 Effusion, left knee: Secondary | ICD-10-CM

## 2016-02-07 DIAGNOSIS — I10 Essential (primary) hypertension: Secondary | ICD-10-CM

## 2016-02-07 DIAGNOSIS — E785 Hyperlipidemia, unspecified: Secondary | ICD-10-CM

## 2016-02-07 DIAGNOSIS — E038 Other specified hypothyroidism: Secondary | ICD-10-CM

## 2016-02-07 MED ORDER — LEVOTHYROXINE SODIUM 50 MCG PO TABS: 50.0000 ug | ORAL_TABLET | Freq: Every day | ORAL | 0 refills | Status: DC

## 2016-02-07 MED ORDER — HYDROCHLOROTHIAZIDE 25 MG PO TABS: 25.0000 mg | ORAL_TABLET | Freq: Every day | ORAL | 0 refills | Status: DC

## 2016-02-07 MED ORDER — TRAMADOL HCL 50 MG PO TABS: 50.0000 mg | ORAL_TABLET | Freq: Two times a day (BID) | ORAL | 1 refills | Status: DC | PRN

## 2016-02-07 MED ORDER — ATORVASTATIN CALCIUM 20 MG PO TABS: 20.0000 mg | ORAL_TABLET | Freq: Every day | ORAL | 0 refills | Status: DC

## 2016-02-07 NOTE — Telephone Encounter (Signed)
Pt. Came into facility requesting a refill on the following medications:  atorvastatin (LIPITOR) 20 MG tablet  hydrochlorothiazide (HYDRODIURIL) 25 MG tablet  levothyroxine (SYNTHROID, LEVOTHROID) 50 MCG tablet  traMADol (ULTRAM) 50 MG tablet    Please f/u

## 2016-02-07 NOTE — Telephone Encounter (Signed)
I cannot refill tramadol, will forward request to Dr. Doreene Burke.

## 2016-02-20 ENCOUNTER — Encounter: Payer: Self-pay | Admitting: Family Medicine

## 2016-02-20 ENCOUNTER — Ambulatory Visit: Payer: Self-pay | Attending: Family Medicine | Admitting: Family Medicine

## 2016-02-20 VITALS — BP 129/84 | HR 95 | Temp 97.7°F | Ht 61.0 in | Wt 140.6 lb

## 2016-02-20 DIAGNOSIS — Z1159 Encounter for screening for other viral diseases: Secondary | ICD-10-CM | POA: Insufficient documentation

## 2016-02-20 DIAGNOSIS — Z114 Encounter for screening for human immunodeficiency virus [HIV]: Secondary | ICD-10-CM | POA: Insufficient documentation

## 2016-02-20 DIAGNOSIS — E038 Other specified hypothyroidism: Secondary | ICD-10-CM | POA: Insufficient documentation

## 2016-02-20 DIAGNOSIS — I10 Essential (primary) hypertension: Secondary | ICD-10-CM | POA: Insufficient documentation

## 2016-02-20 DIAGNOSIS — Z7982 Long term (current) use of aspirin: Secondary | ICD-10-CM | POA: Insufficient documentation

## 2016-02-20 DIAGNOSIS — Z1231 Encounter for screening mammogram for malignant neoplasm of breast: Secondary | ICD-10-CM

## 2016-02-20 DIAGNOSIS — E785 Hyperlipidemia, unspecified: Secondary | ICD-10-CM | POA: Insufficient documentation

## 2016-02-20 DIAGNOSIS — Z79899 Other long term (current) drug therapy: Secondary | ICD-10-CM | POA: Insufficient documentation

## 2016-02-20 DIAGNOSIS — Z23 Encounter for immunization: Secondary | ICD-10-CM

## 2016-02-20 DIAGNOSIS — E2839 Other primary ovarian failure: Secondary | ICD-10-CM

## 2016-02-20 LAB — LDL CHOLESTEROL, DIRECT: Direct LDL: 78 mg/dL (ref <130)

## 2016-02-20 LAB — TSH: TSH: 1.58 mIU/L

## 2016-02-20 NOTE — Patient Instructions (Addendum)
Nashly was seen today for hypertension, hyperlipidemia and hypothyroidism.  Diagnoses and all orders for this visit:  Other specified hypothyroidism -     TSH  Hyperlipidemia -     LDL Cholesterol, Direct  Screening for HIV (human immunodeficiency virus) -     HIV antibody (with reflex)  Need for hepatitis C screening test -     Hepatitis C antibody, reflex  Visit for screening mammogram -     MM DIGITAL SCREENING BILATERAL; Future  Estrogen deficiency -     DG Bone Density; Future  Encounter for immunization -     Flu Vaccine QUAD 36+ mos IM   F/u in 3 monoths   Dr. Adrian Blackwater

## 2016-02-20 NOTE — Assessment & Plan Note (Signed)
A: normal BP Med: compliant P: continue HCTZ 25 mg daily

## 2016-02-20 NOTE — Progress Notes (Signed)
C/C: thyroid and cholesterol levels  Pt is getting flu shot today.

## 2016-02-20 NOTE — Progress Notes (Signed)
Subjective:  Patient ID: Jamie Mitchell, female    DOB: 07-24-1945  Age: 70 y.o. MRN: [id number]  CC: Hypertension; Hyperlipidemia; and Hypothyroidism   HPI Jamie Mitchell is from [place], she  Presents with her daughter  for    1.  HTN: taking HCTZ. No HA, CP, SOB or vision changes. Slight swelling in legs at times. Wearing compression stockings.   2. Hypothyroidism: taking synthroid 50 mcg daily. No anxiety, depression, GI upset. Has lost weight but has cut down on calories.   3. HLD: compliant with lipitor 20 mg daily. No myalgias.   4. HM: amenable to flu shot today, as well as screening for HIV and hep C. Amenable to mammogram and bone density scan. Would like to consider the c-scope.   Social History  Substance Use Topics  . Smoking status: Never Smoker  . Smokeless tobacco: Not on file  . Alcohol use No   Outpatient Medications Prior to Visit  Medication Sig Dispense Refill  . acyclovir (ZOVIRAX) 400 MG tablet Take 1 tablet (400 mg total) by mouth 2 (two) times daily. (Patient not taking: Reported on 04/30/2014) 14 tablet 0  . aspirin 81 MG tablet Take 81 mg by mouth daily. Reported on 07/31/2015    . atorvastatin (LIPITOR) 20 MG tablet Take 1 tablet (20 mg total) by mouth daily. 90 tablet 0  . guaiFENesin (MUCINEX) 600 MG 12 hr tablet Take 1 tablet (600 mg total) by mouth 2 (two) times daily as needed. cough (Patient not taking: Reported on 09/11/2014) 60 tablet 0  . hydrochlorothiazide (HYDRODIURIL) 25 MG tablet Take 1 tablet (25 mg total) by mouth daily. 30 tablet 0  . levothyroxine (SYNTHROID, LEVOTHROID) 50 MCG tablet Take 1 tablet (50 mcg total) by mouth daily. 90 tablet 0  . naproxen (NAPROSYN) 500 MG tablet Take 1 tablet (500 mg total) by mouth 2 (two) times daily with a meal. 60 tablet 3  . traMADol (ULTRAM) 50 MG tablet Take 1 tablet (50 mg total) by mouth every 12 (twelve) hours as needed. 60 tablet 1   No facility-administered medications prior to  visit.     ROS Review of Systems  Constitutional: Negative for chills and fever.  Eyes: Negative for visual disturbance.  Respiratory: Negative for shortness of breath.   Cardiovascular: Negative for chest pain.  Gastrointestinal: Negative for abdominal pain and blood in stool.  Musculoskeletal: Negative for arthralgias and back pain.  Skin: Negative for rash.  Allergic/Immunologic: Negative for immunocompromised state.  Hematological: Negative for adenopathy. Does not bruise/bleed easily.  Psychiatric/Behavioral: Negative for dysphoric mood and suicidal ideas.    Objective:  BP 129/84 (BP Location: Right Arm, Patient Position: Sitting, Cuff Size: Small)   Pulse 95   Temp 97.7 F (36.5 C) (Oral)   Ht 5\' 1"  (1.549 m)   Wt 140 lb 9.6 oz (63.8 kg)   SpO2 95%   BMI 26.57 kg/m   BP/Weight 02/20/2016 123456 [id number]  Systolic BP [id number]  Diastolic BP 84 80 77  Wt. (Lbs) 140.6 145 145.6  BMI 26.57 27.41 27.53   Physical Exam  Constitutional: She is oriented to person, place, and time. She appears well-developed and well-nourished. No distress.  HENT:  Head: Normocephalic and atraumatic.  Neck: No thyromegaly present.  Cardiovascular: Normal rate, regular rhythm, normal heart sounds and intact distal pulses.   Pulmonary/Chest: Effort normal and breath sounds normal.  Musculoskeletal: She exhibits no edema.  Neurological: She is alert and oriented to person,  place, and time.  Skin: Skin is warm and dry. No rash noted.  Psychiatric: She has a normal mood and affect.     Assessment & Plan:  [name] was seen today for follow-up.  Diagnoses and all orders for this visit:  Other specified hypothyroidism -     TSH  Hyperlipidemia -     LDL Cholesterol, Direct  Screening for HIV (human immunodeficiency virus) -     HIV antibody (with reflex)  Need for hepatitis C screening test -     Hepatitis C antibody, reflex   There are no diagnoses linked to this  encounter.  No orders of the defined types were placed in this encounter.   Follow-up: Return in about 3 months (around 05/21/2016) for HTN, hypothyroidism, HLD .   Boykin Nearing MD

## 2016-02-20 NOTE — Assessment & Plan Note (Signed)
Appears euthyroid TSH today

## 2016-02-20 NOTE — Assessment & Plan Note (Signed)
A: HLD P: Continue lipitor Lipids check 6 months ago Direct LDL today

## 2016-02-21 LAB — HEPATITIS C ANTIBODY: HCV AB: NEGATIVE

## 2016-02-21 LAB — HIV ANTIBODY (ROUTINE TESTING W REFLEX): HIV 1&2 Ab, 4th Generation: NONREACTIVE

## 2016-02-28 ENCOUNTER — Telehealth: Payer: Self-pay

## 2016-02-28 NOTE — Telephone Encounter (Signed)
-----   Message from Boykin Nearing, MD sent at 02/21/2016  1:09 PM EDT ----- All labs normal Continue current regimen Screening HIV and hep C negative

## 2016-02-28 NOTE — Telephone Encounter (Signed)
Call placed to [facility] rep ID [phone] assisted with call translation. Call placed to patient at [phone]; RN advised patient per Dr. Adrian Blackwater:  All labs normal  Continue current regimen  Screening HIV and hep C negative  No further questions at this time.  Priscille Heidelberg, RN, BSN

## 2016-03-13 ENCOUNTER — Other Ambulatory Visit: Payer: Self-pay | Admitting: Internal Medicine

## 2016-03-13 DIAGNOSIS — I10 Essential (primary) hypertension: Secondary | ICD-10-CM

## 2016-03-19 ENCOUNTER — Ambulatory Visit: Payer: Self-pay

## 2016-03-23 ENCOUNTER — Telehealth: Payer: Self-pay | Admitting: Family Medicine

## 2016-03-23 DIAGNOSIS — E785 Hyperlipidemia, unspecified: Secondary | ICD-10-CM

## 2016-03-23 DIAGNOSIS — E038 Other specified hypothyroidism: Secondary | ICD-10-CM

## 2016-03-23 DIAGNOSIS — M1712 Unilateral primary osteoarthritis, left knee: Secondary | ICD-10-CM

## 2016-03-23 DIAGNOSIS — I10 Essential (primary) hypertension: Secondary | ICD-10-CM

## 2016-03-23 DIAGNOSIS — M25462 Effusion, left knee: Secondary | ICD-10-CM

## 2016-03-23 MED ORDER — LEVOTHYROXINE SODIUM 50 MCG PO TABS: 50.0000 ug | ORAL_TABLET | Freq: Every day | ORAL | 0 refills | Status: DC

## 2016-03-23 MED ORDER — ATORVASTATIN CALCIUM 20 MG PO TABS: 20.0000 mg | ORAL_TABLET | Freq: Every day | ORAL | 0 refills | Status: DC

## 2016-03-23 MED ORDER — NAPROXEN 500 MG PO TABS: 500.0000 mg | ORAL_TABLET | Freq: Two times a day (BID) | ORAL | 0 refills | Status: DC

## 2016-03-23 NOTE — Telephone Encounter (Signed)
Pt states she is leaving the country sometime this week until January and would like her health maintenance medications and tramadol filled to last this time period  Thank you

## 2016-03-23 NOTE — Telephone Encounter (Signed)
Medication Refill:  Atorvastatin  levothyroxine naproxen

## 2016-03-24 MED ORDER — HYDROCHLOROTHIAZIDE 25 MG PO TABS: 25.0000 mg | ORAL_TABLET | Freq: Every day | ORAL | 0 refills | Status: DC

## 2016-03-24 MED ORDER — ATORVASTATIN CALCIUM 20 MG PO TABS: 20.0000 mg | ORAL_TABLET | Freq: Every day | ORAL | 0 refills | Status: DC

## 2016-03-24 MED ORDER — TRAMADOL HCL 50 MG PO TABS: 50.0000 mg | ORAL_TABLET | Freq: Two times a day (BID) | ORAL | 2 refills | Status: DC | PRN

## 2016-03-24 MED ORDER — TRAMADOL HCL 50 MG PO TABS: 50.0000 mg | ORAL_TABLET | Freq: Two times a day (BID) | ORAL | 0 refills | Status: DC | PRN

## 2016-03-24 MED ORDER — LEVOTHYROXINE SODIUM 50 MCG PO TABS: 50.0000 ug | ORAL_TABLET | Freq: Every day | ORAL | 0 refills | Status: DC

## 2016-03-24 NOTE — Telephone Encounter (Signed)
Tramadol refilled, Rx ready for pick up  Rx written as a 90 day supply

## 2016-03-24 NOTE — Telephone Encounter (Signed)
I have refilled her chronic medications x 90 days for her trip. Will forward request for tramadol to Dr. Adrian Blackwater.

## 2016-03-25 NOTE — Telephone Encounter (Signed)
Pt was called and informed of script being ready for pick up.

## 2016-03-30 ENCOUNTER — Ambulatory Visit: Payer: Self-pay

## 2016-05-04 ENCOUNTER — Telehealth: Payer: Self-pay | Admitting: Family Medicine

## 2016-05-04 DIAGNOSIS — M25462 Effusion, left knee: Secondary | ICD-10-CM

## 2016-05-04 MED ORDER — TRAMADOL HCL 50 MG PO TABS: 50.0000 mg | ORAL_TABLET | Freq: Two times a day (BID) | ORAL | 0 refills | Status: DC | PRN

## 2016-05-04 NOTE — Telephone Encounter (Signed)
Tramadol ready for pick up 30 day supply

## 2016-05-04 NOTE — Telephone Encounter (Signed)
Patient is needing a refill for tramadol. Please follow up.

## 2016-05-05 NOTE — Telephone Encounter (Signed)
Pt was called and informed of script being ready for pick up.

## 2016-07-01 ENCOUNTER — Other Ambulatory Visit: Payer: Self-pay | Admitting: Family Medicine

## 2016-07-01 DIAGNOSIS — M25462 Effusion, left knee: Secondary | ICD-10-CM

## 2016-07-01 NOTE — Telephone Encounter (Signed)
Pt. Daughter in law came to facility requesting a refill on Tramadol for the pt. Please f/u

## 2016-07-03 NOTE — Telephone Encounter (Signed)
Tramadol ready for pick up  

## 2016-08-24 ENCOUNTER — Ambulatory Visit: Payer: Self-pay | Attending: Family Medicine | Admitting: Family Medicine

## 2016-08-24 ENCOUNTER — Encounter: Payer: Self-pay | Admitting: Family Medicine

## 2016-08-24 VITALS — BP 126/84 | HR 81 | Temp 98.0°F | Ht 61.0 in | Wt 139.4 lb

## 2016-08-24 DIAGNOSIS — I10 Essential (primary) hypertension: Secondary | ICD-10-CM | POA: Insufficient documentation

## 2016-08-24 DIAGNOSIS — Z7982 Long term (current) use of aspirin: Secondary | ICD-10-CM | POA: Insufficient documentation

## 2016-08-24 DIAGNOSIS — M1712 Unilateral primary osteoarthritis, left knee: Secondary | ICD-10-CM

## 2016-08-24 DIAGNOSIS — M25461 Effusion, right knee: Secondary | ICD-10-CM | POA: Insufficient documentation

## 2016-08-24 DIAGNOSIS — E038 Other specified hypothyroidism: Secondary | ICD-10-CM | POA: Insufficient documentation

## 2016-08-24 DIAGNOSIS — M25462 Effusion, left knee: Secondary | ICD-10-CM | POA: Insufficient documentation

## 2016-08-24 DIAGNOSIS — E785 Hyperlipidemia, unspecified: Secondary | ICD-10-CM

## 2016-08-24 DIAGNOSIS — E7800 Pure hypercholesterolemia, unspecified: Secondary | ICD-10-CM | POA: Insufficient documentation

## 2016-08-24 MED ORDER — TRAMADOL HCL 50 MG PO TABS: 50.0000 mg | ORAL_TABLET | Freq: Two times a day (BID) | ORAL | 5 refills | Status: DC | PRN

## 2016-08-24 MED ORDER — HYDROCHLOROTHIAZIDE 25 MG PO TABS: 25.0000 mg | ORAL_TABLET | Freq: Every day | ORAL | 0 refills | Status: DC

## 2016-08-24 MED ORDER — ASPIRIN 81 MG PO TABS: 81.0000 mg | ORAL_TABLET | Freq: Every day | ORAL | 0 refills | Status: DC

## 2016-08-24 MED ORDER — TRAMADOL HCL 50 MG PO TABS: 50.0000 mg | ORAL_TABLET | Freq: Two times a day (BID) | ORAL | 2 refills | Status: DC | PRN

## 2016-08-24 MED ORDER — ATORVASTATIN CALCIUM 20 MG PO TABS: 20.0000 mg | ORAL_TABLET | Freq: Every day | ORAL | 0 refills | Status: DC

## 2016-08-24 MED ORDER — LEVOTHYROXINE SODIUM 50 MCG PO TABS: 50.0000 ug | ORAL_TABLET | Freq: Every day | ORAL | 0 refills | Status: DC

## 2016-08-24 NOTE — Assessment & Plan Note (Signed)
Well controlled on HCTZ 25 mg daily Continue current dose

## 2016-08-24 NOTE — Assessment & Plan Note (Signed)
Euthyroid on 50 mcg synthroid in recent past Checking TSH today Refilled synthroid with plan to refill for 6 month supply once labs reviewed

## 2016-08-24 NOTE — Patient Instructions (Addendum)
Timara was seen today for hypertension and hypothyroidism.  Diagnoses and all orders for this visit:  Other specified hypothyroidism -     TSH -     levothyroxine (SYNTHROID, LEVOTHROID) 50 MCG tablet; Take 1 tablet (50 mcg total) by mouth daily.  Essential hypertension -     CMP14+EGFR -     hydrochlorothiazide (HYDRODIURIL) 25 MG tablet; Take 1 tablet (25 mg total) by mouth daily. -     aspirin 81 MG tablet; Take 1 tablet (81 mg total) by mouth daily. Reported on 07/31/2015  Pure hypercholesterolemia -     Lipid Panel -     CMP14+EGFR  Knee effusion, left -     Discontinue: traMADol (ULTRAM) 50 MG tablet; Take 1 tablet (50 mg total) by mouth every 12 (twelve) hours as needed. -     traMADol (ULTRAM) 50 MG tablet; Take 1 tablet (50 mg total) by mouth every 12 (twelve) hours as needed.  Hyperlipidemia, unspecified hyperlipidemia type -     atorvastatin (LIPITOR) 20 MG tablet; Take 1 tablet (20 mg total) by mouth daily.   If able please complete mammogram, bone density scan and colonoscopy in [place]  Dr. Adrian Blackwater

## 2016-08-24 NOTE — Assessment & Plan Note (Signed)
A: L knee pain with effusion. Chronic OA pain P: Refilled tramadol

## 2016-08-24 NOTE — Assessment & Plan Note (Signed)
Compliant with  Lipitor 20 mg daily elderly female, non smoker with HTN Checking lipids Continue Lipitor and aspirin for primary prevention of CAD and CVA

## 2016-08-24 NOTE — Progress Notes (Signed)
Subjective:  Patient ID: Jamie Mitchell, female    DOB: [birth year]  Age: 71 y.o. MRN: [id number]  CC: Hypertension and Hypothyroidism   HPI Jamie Mitchell is from [place] where she spends half of her time, she presents with her daughter for    1.  HTN: taking HCTZ. No HA, CP, SOB or vision changes. Slight swelling in legs at times. Wearing compression stockings.   2. Hypothyroidism: taking synthroid 50 mcg daily. No anxiety, depression, GI upset. Has lost weight but has cut down on calories.   3. HLD: compliant with lipitor 20 mg daily. No myalgias.   4. L knee pain: chronic with effusion. No redness. 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 She is s/p steroid injection x 3. The third injection did not help. Pain in intermittent. Tramadol helps with pain.  DG L knee 01/03/2014 IMPRESSION: Degenerative changes as described, most advanced within the medial compartment. No acute osseous findings.  5. HM: amenable to flu shot today, as well as screening for HIV and hep C. Amenable to mammogram and bone density scan. Would like to consider the c-scope. She is planing to travel back to [place] for 3-5 months starting in 09/2016.   Social History  Substance Use Topics  . Smoking status: Never Smoker  . Smokeless tobacco: Never Used  . Alcohol use No   Outpatient Medications Prior to Visit  Medication Sig Dispense Refill  . aspirin 81 MG tablet Take 81 mg by mouth daily. Reported on 07/31/2015    . atorvastatin (LIPITOR) 20 MG tablet Take 1 tablet (20 mg total) by mouth daily. 90 tablet 0  . hydrochlorothiazide (HYDRODIURIL) 25 MG tablet Take 1 tablet (25 mg total) by mouth daily. 90 tablet 0  . levothyroxine (SYNTHROID, LEVOTHROID) 50 MCG tablet Take 1 tablet (50 mcg total) by mouth daily. 90 tablet 0  . naproxen (NAPROSYN) 500 MG tablet Take 1 tablet (500 mg total) by mouth 2 (two) times daily with a meal. 60 tablet 0  . traMADol (ULTRAM) 50 MG tablet TAKE 1 TABLET BY MOUTH  EVERY 12 HOURS AS NEEDED. 60 tablet 0   No facility-administered medications prior to visit.     ROS Review of Systems  Constitutional: Negative for chills and fever.  Eyes: Negative for visual disturbance.  Respiratory: Negative for shortness of breath.   Cardiovascular: Negative for chest pain.  Gastrointestinal: Negative for abdominal pain and blood in stool.  Musculoskeletal: Positive for arthralgias and joint swelling (L knee ). Negative for back pain.  Skin: Negative for rash.  Allergic/Immunologic: Negative for immunocompromised state.  Hematological: Negative for adenopathy. Does not bruise/bleed easily.  Psychiatric/Behavioral: Negative for dysphoric mood and suicidal ideas.    Objective:  BP 126/84   Pulse 81   Temp 98 F (36.7 C) (Oral)   Ht 5' 1"  (1.549 m)   Wt 139 lb 6.4 oz (63.2 kg)   SpO2 95%   BMI 26.34 kg/m   BP/Weight 08/24/2016 02/20/2016 11/14/6331  Systolic BP 545 625 638  Diastolic BP 84 84 80  Wt. (Lbs) 139.4 140.6 145  BMI 26.34 26.57 27.41   Physical Exam  Constitutional: She is oriented to person, place, and time. She appears well-developed and well-nourished. No distress.  HENT:  Head: Normocephalic and atraumatic.  Neck: No thyromegaly present.  Cardiovascular: Normal rate, regular rhythm, normal heart sounds and intact distal pulses.   Pulmonary/Chest: Effort normal and breath sounds normal.  Musculoskeletal: She exhibits no edema.  Left knee: She exhibits effusion (small effusion L knee ).  Neurological: She is alert and oriented to person, place, and time.  Skin: Skin is warm and dry. No rash noted.  Psychiatric: She has a normal mood and affect.   Lab Results  Component Value Date   TSH 1.58 02/20/2016   Lab Results  Component Value Date   CHOL 133 07/24/2015   HDL 28 (L) 07/24/2015   LDLCALC 49 07/24/2015   LDLDIRECT 78 02/20/2016   TRIG 280 (H) 07/24/2015   CHOLHDL 4.8 07/24/2015    Assessment & Plan:  Jamie Mitchell was seen  today for hypertension and hypothyroidism.  Diagnoses and all orders for this visit:  Other specified hypothyroidism -     TSH -     levothyroxine (SYNTHROID, LEVOTHROID) 50 MCG tablet; Take 1 tablet (50 mcg total) by mouth daily.  Essential hypertension -     CMP14+EGFR -     hydrochlorothiazide (HYDRODIURIL) 25 MG tablet; Take 1 tablet (25 mg total) by mouth daily. -     aspirin 81 MG tablet; Take 1 tablet (81 mg total) by mouth daily. Reported on 07/31/2015  Pure hypercholesterolemia -     Lipid Panel -     CMP14+EGFR  Knee effusion, left -     Discontinue: traMADol (ULTRAM) 50 MG tablet; Take 1 tablet (50 mg total) by mouth every 12 (twelve) hours as needed. -     traMADol (ULTRAM) 50 MG tablet; Take 1 tablet (50 mg total) by mouth every 12 (twelve) hours as needed.  Hyperlipidemia, unspecified hyperlipidemia type -     atorvastatin (LIPITOR) 20 MG tablet; Take 1 tablet (20 mg total) by mouth daily.   There are no diagnoses linked to this encounter.  No orders of the defined types were placed in this encounter.   Follow-up: No Follow-up on file.   Boykin Nearing MD

## 2016-08-25 LAB — CMP14+EGFR
A/G RATIO: 1.4 (ref 1.2–2.2)
ALBUMIN: 4.2 g/dL (ref 3.6–4.8)
ALT: 16 IU/L (ref 0–32)
AST: 20 IU/L (ref 0–40)
Alkaline Phosphatase: 89 IU/L (ref 39–117)
BUN / CREAT RATIO: 23 (ref 12–28)
BUN: 20 mg/dL (ref 8–27)
Bilirubin Total: 0.5 mg/dL (ref 0.0–1.2)
CALCIUM: 9.2 mg/dL (ref 8.7–10.3)
CO2: 26 mmol/L (ref 18–29)
Chloride: 97 mmol/L (ref 96–106)
Creatinine, Ser: 0.88 mg/dL (ref 0.57–1.00)
GFR, EST AFRICAN AMERICAN: 78 mL/min/{1.73_m2} (ref >59)
GFR, EST NON AFRICAN AMERICAN: 68 mL/min/{1.73_m2} (ref >59)
GLOBULIN, TOTAL: 3.1 g/dL (ref 1.5–4.5)
Glucose: 144 mg/dL — ABNORMAL HIGH (ref 65–99)
POTASSIUM: 3.6 mmol/L (ref 3.5–5.2)
SODIUM: 138 mmol/L (ref 134–144)
Total Protein: 7.3 g/dL (ref 6.0–8.5)

## 2016-08-25 LAB — LIPID PANEL
CHOL/HDL RATIO: 4 ratio (ref 0.0–4.4)
Cholesterol, Total: 145 mg/dL (ref 100–199)
HDL: 36 mg/dL — AB (ref >39)
LDL Calculated: 70 mg/dL (ref 0–99)
TRIGLYCERIDES: 197 mg/dL — AB (ref 0–149)
VLDL Cholesterol Cal: 39 mg/dL (ref 5–40)

## 2016-08-25 LAB — TSH: TSH: 2.53 u[IU]/mL (ref 0.450–4.500)

## 2016-08-27 MED ORDER — ATORVASTATIN CALCIUM 20 MG PO TABS: 20.0000 mg | ORAL_TABLET | Freq: Every day | ORAL | 11 refills | Status: DC

## 2016-08-27 MED ORDER — HYDROCHLOROTHIAZIDE 25 MG PO TABS: 25.0000 mg | ORAL_TABLET | Freq: Every day | ORAL | 5 refills | Status: DC

## 2016-08-27 MED ORDER — LEVOTHYROXINE SODIUM 50 MCG PO TABS: 50.0000 ug | ORAL_TABLET | Freq: Every day | ORAL | 5 refills | Status: DC

## 2016-08-27 NOTE — Addendum Note (Signed)
Addended by: Boykin Nearing on: 08/27/2016 06:21 PM   Modules accepted: Orders

## 2016-08-28 ENCOUNTER — Telehealth: Payer: Self-pay

## 2016-08-28 NOTE — Telephone Encounter (Signed)
CMA call to go over lab results  Patient verify DOB  Patient was aware and understood   

## 2016-08-28 NOTE — Telephone Encounter (Signed)
-----   Message from Boykin Nearing, MD sent at 08/27/2016  6:19 PM EDT ----- Normal TSH and metabolic panel Cholesterol has improved Continue lipitor 20 mg daily

## 2016-10-07 ENCOUNTER — Encounter: Payer: Self-pay | Admitting: Family Medicine

## 2016-10-13 ENCOUNTER — Other Ambulatory Visit: Payer: Self-pay | Admitting: Family Medicine

## 2016-10-13 DIAGNOSIS — E038 Other specified hypothyroidism: Secondary | ICD-10-CM

## 2016-10-13 DIAGNOSIS — I10 Essential (primary) hypertension: Secondary | ICD-10-CM

## 2016-11-13 ENCOUNTER — Telehealth: Payer: Self-pay | Admitting: Family Medicine

## 2016-11-13 DIAGNOSIS — M25462 Effusion, left knee: Secondary | ICD-10-CM

## 2016-11-13 NOTE — Telephone Encounter (Signed)
Pt's daughter in law calling to request a refill of Tramadol for pt. Please f/u.

## 2016-11-16 MED ORDER — TRAMADOL HCL 50 MG PO TABS: 50.0000 mg | ORAL_TABLET | Freq: Two times a day (BID) | ORAL | 1 refills | Status: DC | PRN

## 2016-11-16 NOTE — Telephone Encounter (Signed)
Ready for pick up

## 2017-02-22 ENCOUNTER — Encounter (HOSPITAL_COMMUNITY): Payer: Self-pay | Admitting: Emergency Medicine

## 2017-02-22 ENCOUNTER — Ambulatory Visit (HOSPITAL_COMMUNITY)
Admission: EM | Admit: 2017-02-22 | Discharge: 2017-02-22 | Disposition: A | Discharge status: Home / Self Care | Payer: Self-pay | Attending: Internal Medicine | Admitting: Internal Medicine

## 2017-02-22 DIAGNOSIS — R040 Epistaxis: Secondary | ICD-10-CM | POA: Diagnosis not present

## 2017-02-22 LAB — POCT I-STAT, CHEM 8
BUN: 17 mg/dL (ref 6–20)
Calcium, Ion: 1.15 mmol/L (ref 1.15–1.40)
Chloride: 102 mmol/L (ref 101–111)
Creatinine, Ser: 0.9 mg/dL (ref 0.44–1.00)
Glucose, Bld: 88 mg/dL (ref 65–99)
HEMATOCRIT: 45 % (ref 36.0–46.0)
HEMOGLOBIN: 15.3 g/dL — AB (ref 12.0–15.0)
POTASSIUM: 4.3 mmol/L (ref 3.5–5.1)
SODIUM: 140 mmol/L (ref 135–145)
TCO2: 28 mmol/L (ref 22–32)

## 2017-02-22 NOTE — Discharge Instructions (Signed)
Blood work without anemia. Follow instructions on attachment to help stop nose bleeds as needed. Follow up with ENT for further evaluation as needed.

## 2017-02-22 NOTE — ED Provider Notes (Signed)
[facility]    CSN: [id number] Arrival date & time: 02/22/17  [place]     History   Chief Complaint Chief Complaint  Patient presents with  . Epistaxis    HPI Jamie Mitchell is a 71 y.o. female.   71 year old female with history of hypertension, thyroid disease comes in for three-day history of nosebleeds. History was given by patient and family member through interpreter. Symptoms first started after accidentally hitting her nose. Bleeding is controlled with pressure, but will be started after sneezing or bending over. Patient feels weakness, and is worried about anemia. Patient states 6 years ago in [place], she was told "a membrane in the nose ruptured", and is worried that that happened again. Patient is not currently bleeding. Denies URI symptoms such as cough, congestion, rhinorrhea, sore throat.      Past Medical History:  Diagnosis Date  . Hypertension   . Thyroid disease     Patient Active Problem List   Diagnosis Date Noted  . Hypertension 07/23/2015  . Hyperlipidemia 07/23/2015  . 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 Hypothyroidism 01/25/2014    History reviewed. No pertinent surgical history.  OB History    No data available       Home Medications    Prior to Admission medications   Medication Sig Start Date End Date Taking? Authorizing Provider  aspirin 81 MG tablet Take 1 tablet (81 mg total) by mouth daily. Reported on 07/31/2015 08/24/16  Yes Funches, Josalyn, MD  atorvastatin (LIPITOR) 20 MG tablet Take 1 tablet (20 mg total) by mouth daily. 08/27/16  Yes Funches, Josalyn, MD  hydrochlorothiazide (HYDRODIURIL) 25 MG tablet Take 1 tablet (25 mg total) by mouth daily. 08/27/16  Yes Funches, Josalyn, MD  levothyroxine (SYNTHROID, LEVOTHROID) 50 MCG tablet Take 1 tablet (50 mcg total) by mouth daily. 08/27/16  Yes Funches, Josalyn, MD  traMADol (ULTRAM) 50 MG tablet Take 1 tablet (50 mg total) by mouth every 12 (twelve) hours  as needed. 11/16/16  Yes Arnoldo Morale, MD  naproxen (NAPROSYN) 500 MG tablet Take 1 tablet (500 mg total) by mouth 2 (two) times daily with a meal. 03/23/16   Boykin Nearing, MD    Family History History reviewed. No pertinent family history.  Social History Social History  Substance Use Topics  . Smoking status: Never Smoker  . Smokeless tobacco: Never Used  . Alcohol use No     Allergies   Patient has no known allergies.   Review of Systems Review of Systems  Reason unable to perform ROS: See HPI as above.     Physical Exam Triage Vital Signs ED Triage Vitals [02/22/17 1119]  Enc Vitals Group     BP (!) 146/79     Pulse Rate 62     Resp      Temp 98.6 F (37 C)     Temp Source Oral     SpO2 98 %     Weight      Height      Head Circumference      Peak Flow      Pain Score      Pain Loc      Pain Edu?      Excl. in [place]?    No data found.   Updated Vital Signs BP (!) 146/79 (BP Location: Left Arm)   Pulse 62   Temp 98.6 F (37 C) (Oral)   SpO2 98%   Physical Exam  Constitutional:  She is oriented to person, place, and time. She appears well-developed and well-nourished. No distress.  HENT:  Head: Normocephalic and atraumatic.  Nose: No mucosal edema.  Left nare with dried blood, no active bleeding, no obvious bleeding site.   Eyes: Pupils are equal, round, and reactive to light. Conjunctivae are normal.  Neurological: She is alert and oriented to person, place, and time.     UC Treatments / Results  Labs (all labs ordered are listed, but only abnormal results are displayed) Labs Reviewed  POCT I-STAT, CHEM 8 - Abnormal; Notable for the following:       Result Value   Hemoglobin 15.3 (*)    All other components within normal limits    EKG  EKG Interpretation None       Radiology No results found.  Procedures Procedures (including critical care time)  Medications Ordered in UC Medications - No data to display   Initial  Impression / Assessment and Plan / UC Course  I have reviewed the triage vital signs and the nursing notes.  Pertinent labs & imaging results that were available during my care of the patient were reviewed by me and considered in my medical decision making (see chart for details).    Istat without anemia. No active bleeding currently. ENT information provided, patient to follow up with ENT if symptoms continues.   Final Clinical Impressions(s) / UC Diagnoses   Final diagnoses:  Epistaxis    New Prescriptions Discharge Medication List as of 02/22/2017 12:54 PM        Ok Edwards, PA-C 02/22/17 1305

## 2017-02-22 NOTE — ED Triage Notes (Signed)
Pt has been suffering from nose bleeds for three days.  Pt's daughter-in-law states she will get them when she bends over and when she sneezes.

## 2017-02-26 ENCOUNTER — Ambulatory Visit: Payer: Medicare Other | Attending: Family Medicine | Admitting: Family Medicine

## 2017-02-26 ENCOUNTER — Encounter: Payer: Self-pay | Admitting: Family Medicine

## 2017-02-26 VITALS — BP 125/76 | HR 69 | Temp 98.2°F | Resp 18 | Ht 61.0 in | Wt 143.6 lb

## 2017-02-26 DIAGNOSIS — Z0000 Encounter for general adult medical examination without abnormal findings: Secondary | ICD-10-CM | POA: Diagnosis not present

## 2017-02-26 DIAGNOSIS — E038 Other specified hypothyroidism: Secondary | ICD-10-CM

## 2017-02-26 DIAGNOSIS — Z79899 Other long term (current) drug therapy: Secondary | ICD-10-CM | POA: Insufficient documentation

## 2017-02-26 DIAGNOSIS — Z7982 Long term (current) use of aspirin: Secondary | ICD-10-CM | POA: Insufficient documentation

## 2017-02-26 DIAGNOSIS — Z23 Encounter for immunization: Secondary | ICD-10-CM | POA: Diagnosis not present

## 2017-02-26 DIAGNOSIS — I10 Essential (primary) hypertension: Secondary | ICD-10-CM

## 2017-02-26 DIAGNOSIS — Z760 Encounter for issue of repeat prescription: Secondary | ICD-10-CM | POA: Diagnosis not present

## 2017-02-26 DIAGNOSIS — E785 Hyperlipidemia, unspecified: Secondary | ICD-10-CM

## 2017-02-26 MED ORDER — PNEUMOCOCCAL 13-VAL CONJ VACC IM SUSP
0.5000 mL | Freq: Once | INTRAMUSCULAR | Status: AC
  Administered 2017-02-26: 0.5 mL via INTRAMUSCULAR

## 2017-02-26 MED ORDER — NAPROXEN 500 MG PO TABS: 500.0000 mg | ORAL_TABLET | Freq: Two times a day (BID) | ORAL | 0 refills | Status: DC | PRN

## 2017-02-26 MED ORDER — ASPIRIN 81 MG PO TABS: 81.0000 mg | ORAL_TABLET | Freq: Every day | ORAL | 11 refills | Status: DC

## 2017-02-26 MED ORDER — TRAMADOL HCL 50 MG PO TABS: 50.0000 mg | ORAL_TABLET | Freq: Two times a day (BID) | ORAL | 0 refills | Status: DC | PRN

## 2017-02-26 MED ORDER — HYDROCHLOROTHIAZIDE 25 MG PO TABS: 25.0000 mg | ORAL_TABLET | Freq: Every day | ORAL | 5 refills | Status: DC

## 2017-02-26 NOTE — Patient Instructions (Signed)
Hipotiroidismo (Hypothyroidism) El hipotiroidismo es un trastorno de la tiroides, una glndula grande ubicada en la parte anterior e inferior del cuello. La tiroides Administrator hormonas que controlan el funcionamiento del [place]. En los casos de hipotiroidismo, la glndula no produce la cantidad suficiente de estas hormonas. CAUSAS Las causas del hipotiroidismo pueden incluir lo siguiente:  Infecciones virales.  Embarazo.  Un ataque del sistema de defensa (sistema inmunitario) a la tiroides.  Algunos medicamentos.  Defectos de nacimiento.  Radioterapias anteriores en la cabeza o el cuello.  Tratamiento previo con yodo radioactivo.  Extirpacin quirrgica previa de una parte o de toda la tiroides.  Problemas con la glndula ubicada en el centro del cerebro (hipfisis). [place] signos y los sntomas de hipotiroidismo pueden ser los siguientes:  Sensacin de falta de Teacher, early years/pre ([place]).  Incapacidad para tolerar el fro.  Aumento de peso que no puede explicarse por un cambio en la dieta o en los hbitos de ejercicio fsico.  Piel seca.  Pelo grueso.  Irregularidades menstruales.  Ralentizacin de los procesos de pensamiento.  Estreimiento.  Tristeza o depresin. DIAGNSTICO El mdico puede diagnosticar el hipotiroidismo con anlisis de sangre y ecografas. TRATAMIENTO El hipotiroidismo se trata con medicamentos que reemplazan las hormonas que el cuerpo no produce. Despus de Biochemist, clinical, pueden pasar varias semanas hasta la desaparicin de los sntomas. [name] los medicamentos solamente como se lo haya indicado el mdico.  Si empieza a tomar medicamentos nuevos, infrmele al mdico.  Concurra a todas las visitas de control como se lo haya indicado el mdico. Esto es importante. A medida que la enfermedad mejora, es posible que haya que modificar las dosis. Tendr que hacerse anlisis de sangre  peridicamente, de modo que el mdico pueda controlar la enfermedad.  SOLICITE ATENCIN MDICA SI:  Los sntomas no mejoran con Dispensing optician.  Est tomando medicamentos sustitutivos de la tiroides y: ? Copywriter, advertising. ? Siente temblores. ? Est ansioso. ? [place] de [place] rpidamente. ? No puede Agricultural engineer. ? Tiene cambios emocionales. ? Tiene diarrea. ? Se siente dbil.  SOLICITE ATENCIN MDICA DE INMEDIATO SI:  Siente dolor en el pecho.  Tiene latidos cardacos irregulares o siente dolor en el pecho.  Nota que la frecuencia cardaca est acelerada.  Esta informacin no tiene Marine scientist el consejo del mdico. Asegrese de hacerle al mdico cualquier pregunta que tenga. Document Released: 05/11/2005 Document Revised: 06/01/2014 Document Reviewed: 09/26/2013 Elsevier Interactive Patient Education  2017 [employer].

## 2017-02-26 NOTE — Progress Notes (Signed)
Subjective:  Patient ID: Jamie Mitchell, female    DOB: [birth year]  Age: 71 y.o. MRN: [id number]  CC: Follow-up   HPI Jamie Mitchell presents for HTN, HLD, and hypothyroidism follow up. She is accompanied by her daughter in law. Interpreter services used. History of hypertension and hyperlipidemia. She does not check BP at home. Cardiac symptoms none. Patient denies chest pain, dyspnea and lower extremity edema.  Cardiovascular risk factors: dyslipidemia, hypertension and sedentary lifestyle. Use of agents associated with hypertension: NSAIDS. History of target organ damage: none. History of hypothyroidism. Patient presents for evaluation of thyroid function. Symptoms consist of denies fatigue, weight changes, heat/cold intolerance, bowel/skin changes or CVS symptoms.  Previous thyroid studies include TSH.   Outpatient Medications Prior to Visit  Medication Sig Dispense Refill  . aspirin 81 MG tablet Take 1 tablet (81 mg total) by mouth daily. Reported on 07/31/2015 30 tablet 0  . atorvastatin (LIPITOR) 20 MG tablet Take 1 tablet (20 mg total) by mouth daily. 30 tablet 11  . hydrochlorothiazide (HYDRODIURIL) 25 MG tablet Take 1 tablet (25 mg total) by mouth daily. 30 tablet 5  . levothyroxine (SYNTHROID, LEVOTHROID) 50 MCG tablet Take 1 tablet (50 mcg total) by mouth daily. 30 tablet 5  . naproxen (NAPROSYN) 500 MG tablet Take 1 tablet (500 mg total) by mouth 2 (two) times daily with a meal. 60 tablet 0  . traMADol (ULTRAM) 50 MG tablet Take 1 tablet (50 mg total) by mouth every 12 (twelve) hours as needed. 60 tablet 1   No facility-administered medications prior to visit.     ROS Review of Systems  Constitutional: Negative.   Eyes: Negative.   Respiratory: Negative.   Cardiovascular: Negative.   Gastrointestinal: Negative.   Endocrine: Negative.   Skin: Negative.   Neurological: Negative.    Objective:  BP 125/76 (BP Location: Left Arm, Patient Position: Sitting,  Cuff Size: Normal)   Pulse 69   Temp 98.2 F (36.8 C) (Oral)   Resp 18   Ht 5\' 1"  (1.549 m)   Wt 143 lb 9.6 oz (65.1 kg)   SpO2 95%   BMI 27.13 kg/m   BP/Weight 02/26/2017 32/01/9241 10/30/3417  Systolic BP 622 297 989  Diastolic BP 76 79 84  Wt. (Lbs) 143.6 - 139.4  BMI 27.13 - 26.34     Physical Exam  Constitutional: She appears well-developed and well-nourished.  Neck: Normal range of motion. Neck supple. No JVD present. No thyromegaly present.  Cardiovascular: Normal rate, regular rhythm and intact distal pulses.   Pulmonary/Chest: Effort normal and breath sounds normal.  Abdominal: Soft. Bowel sounds are normal. There is no tenderness.  Skin: Skin is warm and dry.  Psychiatric: She has a normal mood and affect.  Nursing note and vitals reviewed.   Assessment & Plan:   1. Essential hypertension  - CMP and Liver - Lipid Panel - hydrochlorothiazide (HYDRODIURIL) 25 MG tablet; Take 1 tablet (25 mg total) by mouth daily.  Dispense: 30 tablet; Refill: 5 - aspirin 81 MG tablet; Take 1 tablet (81 mg total) by mouth daily. Reported on 07/31/2015  Dispense: 30 tablet; Refill: 11  2. Hyperlipidemia, unspecified hyperlipidemia type  - CMP and Liver - Lipid Panel  3. Other specified hypothyroidism  - TSH  4. Needs flu shot  - Flu Vaccine QUAD 6+ mos PF IM (Fluarix Quad PF)  5. Healthcare maintenance  - MM SCREENING BREAST TOMO BILATERAL; Future - pneumococcal 13-valent conjugate vaccine (PREVNAR 13) injection 0.5  mL; Inject 0.5 mLs into the muscle once.   Meds ordered this encounter  Medications  . pneumococcal 13-valent conjugate vaccine (PREVNAR 13) injection 0.5 mL  . hydrochlorothiazide (HYDRODIURIL) 25 MG tablet    Sig: Take 1 tablet (25 mg total) by mouth daily.    Dispense:  30 tablet    Refill:  5    Order Specific Question:   Supervising Provider    Answer:   Tresa Garter [id number]  . aspirin 81 MG tablet    Sig: Take 1 tablet (81 mg total) by  mouth daily. Reported on 07/31/2015    Dispense:  30 tablet    Refill:  11    Order Specific Question:   Supervising Provider    Answer:   Tresa Garter [[id number]]  . traMADol (ULTRAM) 50 MG tablet    Sig: Take 1 tablet (50 mg total) by mouth every 12 (twelve) hours as needed for severe pain (Take with food).    Dispense:  40 tablet    Refill:  0    Order Specific Question:   Supervising Provider    Answer:   Tresa Garter [id number]  . naproxen (NAPROSYN) 500 MG tablet    Sig: Take 1 tablet (500 mg total) by mouth every 12 (twelve) hours as needed for moderate pain (Take with food.).    Dispense:  60 tablet    Refill:  0    Order Specific Question:   Supervising Provider    Answer:   Tresa Garter [[id number]]    Follow-up: Return in about 3 months (around 05/29/2017) for HTN/HLD/Hypothyroidism.   Alfonse Spruce FNP

## 2017-02-26 NOTE — Progress Notes (Signed)
Patient is here for f/up  

## 2017-02-27 LAB — LIPID PANEL
CHOL/HDL RATIO: 3.9 ratio (ref 0.0–4.4)
Cholesterol, Total: 137 mg/dL (ref 100–199)
HDL: 35 mg/dL — AB (ref >39)
LDL CALC: 49 mg/dL (ref 0–99)
Triglycerides: 265 mg/dL — ABNORMAL HIGH (ref 0–149)
VLDL CHOLESTEROL CAL: 53 mg/dL — AB (ref 5–40)

## 2017-02-27 LAB — CMP AND LIVER
ALT: 23 IU/L (ref 0–32)
AST: 20 IU/L (ref 0–40)
Albumin: 4.2 g/dL (ref 3.6–4.8)
Alkaline Phosphatase: 99 IU/L (ref 39–117)
BILIRUBIN, DIRECT: 0.11 mg/dL (ref 0.00–0.40)
BUN: 15 mg/dL (ref 8–27)
Bilirubin Total: 0.5 mg/dL (ref 0.0–1.2)
CALCIUM: 9.1 mg/dL (ref 8.7–10.3)
CO2: 26 mmol/L (ref 20–29)
CREATININE: 0.91 mg/dL (ref 0.57–1.00)
Chloride: 101 mmol/L (ref 96–106)
GFR calc non Af Amer: 65 mL/min/{1.73_m2} (ref >59)
GFR, EST AFRICAN AMERICAN: 75 mL/min/{1.73_m2} (ref >59)
Glucose: 80 mg/dL (ref 65–99)
Potassium: 4.2 mmol/L (ref 3.5–5.2)
SODIUM: 142 mmol/L (ref 134–144)
TOTAL PROTEIN: 6.7 g/dL (ref 6.0–8.5)

## 2017-02-27 LAB — TSH: TSH: 4.81 u[IU]/mL — ABNORMAL HIGH (ref 0.450–4.500)

## 2017-03-04 ENCOUNTER — Telehealth: Payer: Self-pay

## 2017-03-04 ENCOUNTER — Other Ambulatory Visit: Payer: Self-pay | Admitting: Family Medicine

## 2017-03-04 DIAGNOSIS — E785 Hyperlipidemia, unspecified: Secondary | ICD-10-CM

## 2017-03-04 DIAGNOSIS — E038 Other specified hypothyroidism: Secondary | ICD-10-CM

## 2017-03-04 MED ORDER — ATORVASTATIN CALCIUM 40 MG PO TABS: 40.0000 mg | ORAL_TABLET | Freq: Every day | ORAL | 11 refills | Status: DC

## 2017-03-04 MED ORDER — LEVOTHYROXINE SODIUM 75 MCG PO TABS: 75.0000 ug | ORAL_TABLET | Freq: Every day | ORAL | 2 refills | Status: DC

## 2017-03-04 NOTE — Telephone Encounter (Signed)
-----   Message from Alfonse Spruce, Pace sent at 03/04/2017 12:54 PM EDT ----- Kidney function normal Liver function normal Thyroid function is not normal on current levothyroxine dosage. Your dosage will be increased.  Follow up in 8 weeks. Lipid levels higher than previous labs. This can increase your risk of heart disease overtime. Your dose of atorvastatin will be increased. Start eating a diet low in saturated fat. Limit your intake of fried foods, red meats, and whole milk.

## 2017-03-04 NOTE — Telephone Encounter (Signed)
CMA call regarding lab results   Patient Verify DOB   Patient was aware and understood  

## 2017-04-06 ENCOUNTER — Telehealth: Payer: Self-pay | Admitting: Family Medicine

## 2017-04-06 NOTE — Telephone Encounter (Signed)
Pt. Daughter in law came to facility stating that pt. Had to leave the country and would like 3 to 4 month supply. Pt. Does not know how long she will be out of the country. Pt. Son will be leaving next week and he would take the medications to the pt.   atorvastatin (LIPITOR) 40 MG tablet hydrochlorothiazide (HYDRODIURIL) 25 MG tablet  levothyroxine (SYNTHROID, LEVOTHROID) 75 MCG tablet traMADol (ULTRAM) 50 MG tablet  Pt. Daughter in law was told that the Tramadol could only possibly be prescribed for one month. Pt. Uses [facility] pharmacy.  Please f/u

## 2017-04-07 NOTE — Telephone Encounter (Signed)
CMA call regarding the medication refill for 3 month supply   Spoke with pharmacy, pharmacy stated that they already fill the 3 month supply due that medications had refill

## 2017-04-20 ENCOUNTER — Ambulatory Visit: Payer: Medicare Other

## 2017-10-19 ENCOUNTER — Other Ambulatory Visit: Payer: Self-pay | Admitting: Family Medicine

## 2017-10-19 ENCOUNTER — Telehealth: Payer: Self-pay | Admitting: Family Medicine

## 2017-10-19 DIAGNOSIS — Z760 Encounter for issue of repeat prescription: Secondary | ICD-10-CM

## 2017-10-19 NOTE — Telephone Encounter (Signed)
Patient daughter in law came requesting a refill on Tramadol. Patient uses [facility] pharmacy.  Please f/u

## 2017-10-20 NOTE — Telephone Encounter (Signed)
Called patient and stated that her Tramadol could not get refilled until she has an OV. Patient stated she would call back.

## 2017-10-20 NOTE — Telephone Encounter (Signed)
She has not been seen since 03/2017.  She will need an office visit prior to that refill.

## 2019-07-19 ENCOUNTER — Emergency Department (HOSPITAL_COMMUNITY): Payer: Medicare Other

## 2019-07-19 ENCOUNTER — Encounter (HOSPITAL_COMMUNITY): Payer: Self-pay | Admitting: Emergency Medicine

## 2019-07-19 ENCOUNTER — Other Ambulatory Visit: Payer: Self-pay

## 2019-07-19 ENCOUNTER — Inpatient Hospital Stay (HOSPITAL_COMMUNITY)
Admission: EM | Admit: 2019-07-19 | Discharge: 2019-07-25 | DRG: 023 | Disposition: A | Discharge status: Rehabilitation Facility | Payer: Medicare Other | Attending: Neurology | Admitting: Neurology

## 2019-07-19 DIAGNOSIS — I6523 Occlusion and stenosis of bilateral carotid arteries: Secondary | ICD-10-CM | POA: Diagnosis not present

## 2019-07-19 DIAGNOSIS — I10 Essential (primary) hypertension: Secondary | ICD-10-CM | POA: Diagnosis present

## 2019-07-19 DIAGNOSIS — I63412 Cerebral infarction due to embolism of left middle cerebral artery: Secondary | ICD-10-CM | POA: Diagnosis not present

## 2019-07-19 DIAGNOSIS — R339 Retention of urine, unspecified: Secondary | ICD-10-CM | POA: Diagnosis present

## 2019-07-19 DIAGNOSIS — Y839 Surgical procedure, unspecified as the cause of abnormal reaction of the patient, or of later complication, without mention of misadventure at the time of the procedure: Secondary | ICD-10-CM

## 2019-07-19 DIAGNOSIS — E785 Hyperlipidemia, unspecified: Secondary | ICD-10-CM | POA: Diagnosis present

## 2019-07-19 DIAGNOSIS — R29810 Facial weakness: Secondary | ICD-10-CM | POA: Diagnosis present

## 2019-07-19 DIAGNOSIS — R29818 Other symptoms and signs involving the nervous system: Secondary | ICD-10-CM | POA: Diagnosis not present

## 2019-07-19 DIAGNOSIS — N179 Acute kidney failure, unspecified: Secondary | ICD-10-CM | POA: Diagnosis present

## 2019-07-19 DIAGNOSIS — I495 Sick sinus syndrome: Secondary | ICD-10-CM | POA: Diagnosis present

## 2019-07-19 DIAGNOSIS — E872 Acidosis: Secondary | ICD-10-CM | POA: Diagnosis not present

## 2019-07-19 DIAGNOSIS — J95821 Acute postprocedural respiratory failure: Secondary | ICD-10-CM | POA: Diagnosis not present

## 2019-07-19 DIAGNOSIS — I609 Nontraumatic subarachnoid hemorrhage, unspecified: Secondary | ICD-10-CM | POA: Diagnosis present

## 2019-07-19 DIAGNOSIS — Z978 Presence of other specified devices: Secondary | ICD-10-CM

## 2019-07-19 DIAGNOSIS — I639 Cerebral infarction, unspecified: Secondary | ICD-10-CM

## 2019-07-19 DIAGNOSIS — R4182 Altered mental status, unspecified: Secondary | ICD-10-CM | POA: Diagnosis not present

## 2019-07-19 DIAGNOSIS — N9489 Other specified conditions associated with female genital organs and menstrual cycle: Secondary | ICD-10-CM | POA: Diagnosis present

## 2019-07-19 DIAGNOSIS — I959 Hypotension, unspecified: Secondary | ICD-10-CM | POA: Diagnosis present

## 2019-07-19 DIAGNOSIS — R404 Transient alteration of awareness: Secondary | ICD-10-CM | POA: Diagnosis not present

## 2019-07-19 DIAGNOSIS — I4891 Unspecified atrial fibrillation: Secondary | ICD-10-CM | POA: Diagnosis present

## 2019-07-19 DIAGNOSIS — J9690 Respiratory failure, unspecified, unspecified whether with hypoxia or hypercapnia: Secondary | ICD-10-CM

## 2019-07-19 DIAGNOSIS — Z79890 Hormone replacement therapy (postmenopausal): Secondary | ICD-10-CM

## 2019-07-19 DIAGNOSIS — R471 Dysarthria and anarthria: Secondary | ICD-10-CM | POA: Diagnosis present

## 2019-07-19 DIAGNOSIS — J9600 Acute respiratory failure, unspecified whether with hypoxia or hypercapnia: Secondary | ICD-10-CM | POA: Diagnosis not present

## 2019-07-19 DIAGNOSIS — I771 Stricture of artery: Secondary | ICD-10-CM | POA: Diagnosis present

## 2019-07-19 DIAGNOSIS — Z6828 Body mass index (BMI) 28.0-28.9, adult: Secondary | ICD-10-CM

## 2019-07-19 DIAGNOSIS — I4892 Unspecified atrial flutter: Secondary | ICD-10-CM | POA: Diagnosis present

## 2019-07-19 DIAGNOSIS — E663 Overweight: Secondary | ICD-10-CM | POA: Diagnosis present

## 2019-07-19 DIAGNOSIS — T85618A Breakdown (mechanical) of other specified internal prosthetic devices, implants and grafts, initial encounter: Secondary | ICD-10-CM | POA: Diagnosis not present

## 2019-07-19 DIAGNOSIS — Z20822 Contact with and (suspected) exposure to covid-19: Secondary | ICD-10-CM | POA: Diagnosis present

## 2019-07-19 DIAGNOSIS — J9601 Acute respiratory failure with hypoxia: Secondary | ICD-10-CM | POA: Diagnosis not present

## 2019-07-19 DIAGNOSIS — N19 Unspecified kidney failure: Secondary | ICD-10-CM | POA: Diagnosis not present

## 2019-07-19 DIAGNOSIS — I480 Paroxysmal atrial fibrillation: Secondary | ICD-10-CM | POA: Diagnosis present

## 2019-07-19 DIAGNOSIS — Z7982 Long term (current) use of aspirin: Secondary | ICD-10-CM

## 2019-07-19 DIAGNOSIS — R4701 Aphasia: Secondary | ICD-10-CM | POA: Diagnosis present

## 2019-07-19 DIAGNOSIS — Z79899 Other long term (current) drug therapy: Secondary | ICD-10-CM

## 2019-07-19 DIAGNOSIS — I6602 Occlusion and stenosis of left middle cerebral artery: Secondary | ICD-10-CM | POA: Diagnosis not present

## 2019-07-19 DIAGNOSIS — G8191 Hemiplegia, unspecified affecting right dominant side: Secondary | ICD-10-CM | POA: Diagnosis not present

## 2019-07-19 DIAGNOSIS — E039 Hypothyroidism, unspecified: Secondary | ICD-10-CM | POA: Diagnosis present

## 2019-07-19 DIAGNOSIS — R7303 Prediabetes: Secondary | ICD-10-CM

## 2019-07-19 DIAGNOSIS — R4781 Slurred speech: Secondary | ICD-10-CM | POA: Diagnosis not present

## 2019-07-19 DIAGNOSIS — Z96652 Presence of left artificial knee joint: Secondary | ICD-10-CM | POA: Diagnosis present

## 2019-07-19 DIAGNOSIS — R29725 NIHSS score 25: Secondary | ICD-10-CM | POA: Diagnosis present

## 2019-07-19 LAB — COMPREHENSIVE METABOLIC PANEL
ALT: 15 U/L (ref 0–44)
AST: 22 U/L (ref 15–41)
Albumin: 4 g/dL (ref 3.5–5.0)
Alkaline Phosphatase: 106 U/L (ref 38–126)
Anion gap: 9 (ref 5–15)
BUN: 18 mg/dL (ref 8–23)
CO2: 24 mmol/L (ref 22–32)
Calcium: 9.2 mg/dL (ref 8.9–10.3)
Chloride: 107 mmol/L (ref 98–111)
Creatinine, Ser: 0.92 mg/dL (ref 0.44–1.00)
GFR calc Af Amer: >60 mL/min (ref >60)
GFR calc non Af Amer: >60 mL/min (ref >60)
Glucose, Bld: 105 mg/dL — ABNORMAL HIGH (ref 70–99)
Potassium: 4.3 mmol/L (ref 3.5–5.1)
Sodium: 140 mmol/L (ref 135–145)
Total Bilirubin: 0.9 mg/dL (ref 0.3–1.2)
Total Protein: 7.8 g/dL (ref 6.5–8.1)

## 2019-07-19 LAB — I-STAT CHEM 8, ED
BUN: 19 mg/dL (ref 8–23)
Calcium, Ion: 1.16 mmol/L (ref 1.15–1.40)
Chloride: 105 mmol/L (ref 98–111)
Creatinine, Ser: 0.9 mg/dL (ref 0.44–1.00)
Glucose, Bld: 101 mg/dL — ABNORMAL HIGH (ref 70–99)
HCT: 44 % (ref 36.0–46.0)
Hemoglobin: 15 g/dL (ref 12.0–15.0)
Potassium: 4.2 mmol/L (ref 3.5–5.1)
Sodium: 140 mmol/L (ref 135–145)
TCO2: 26 mmol/L (ref 22–32)

## 2019-07-19 LAB — ETHANOL: Alcohol, Ethyl (B): <10 mg/dL (ref <10)

## 2019-07-19 LAB — CBC
HCT: 45.4 % (ref 36.0–46.0)
Hemoglobin: 14.8 g/dL (ref 12.0–15.0)
MCH: 30.1 pg (ref 26.0–34.0)
MCHC: 32.6 g/dL (ref 30.0–36.0)
MCV: 92.5 fL (ref 80.0–100.0)
Platelets: 256 10*3/uL (ref 150–400)
RBC: 4.91 MIL/uL (ref 3.87–5.11)
RDW: 13.9 % (ref 11.5–15.5)
WBC: 6.9 10*3/uL (ref 4.0–10.5)
nRBC: 0 % (ref 0.0–0.2)

## 2019-07-19 LAB — DIFFERENTIAL
Abs Immature Granulocytes: 0.02 10*3/uL (ref 0.00–0.07)
Basophils Absolute: 0.1 10*3/uL (ref 0.0–0.1)
Basophils Relative: 1 %
Eosinophils Absolute: 0.2 10*3/uL (ref 0.0–0.5)
Eosinophils Relative: 3 %
Immature Granulocytes: 0 %
Lymphocytes Relative: 49 %
Lymphs Abs: 3.4 10*3/uL (ref 0.7–4.0)
Monocytes Absolute: 0.6 10*3/uL (ref 0.1–1.0)
Monocytes Relative: 8 %
Neutro Abs: 2.7 10*3/uL (ref 1.7–7.7)
Neutrophils Relative %: 39 %

## 2019-07-19 LAB — PROTIME-INR
INR: 1 (ref 0.8–1.2)
Prothrombin Time: 12.9 seconds (ref 11.4–15.2)

## 2019-07-19 LAB — CBG MONITORING, ED: Glucose-Capillary: 102 mg/dL — ABNORMAL HIGH (ref 70–99)

## 2019-07-19 LAB — APTT: aPTT: 30 seconds (ref 24–36)

## 2019-07-19 IMAGING — CT CT ANGIO NECK
3 of 7 series · 10 of 36 positions shown · IV contrast (OMNI 350)
Comparison: Prior head CT from earlier same day.

CLINICAL DATA: Initial evaluation for acute right-sided weakness.



[Series 5: cta neck · axial · 0.44mm/px · z∈[-248,-142]mm · 2 of 159 slices shown]
[im 53/159  soft-tissue]
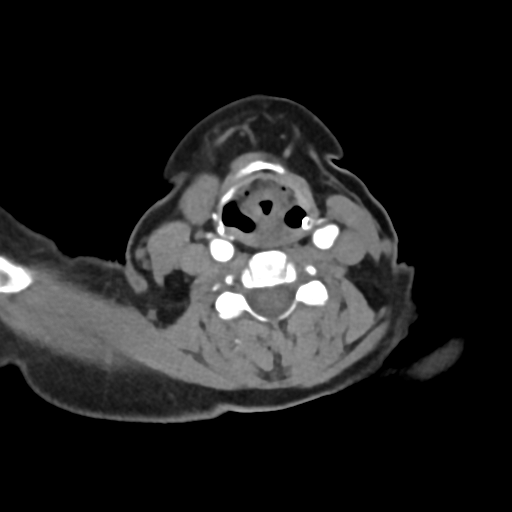
[im 106/159  soft-tissue]
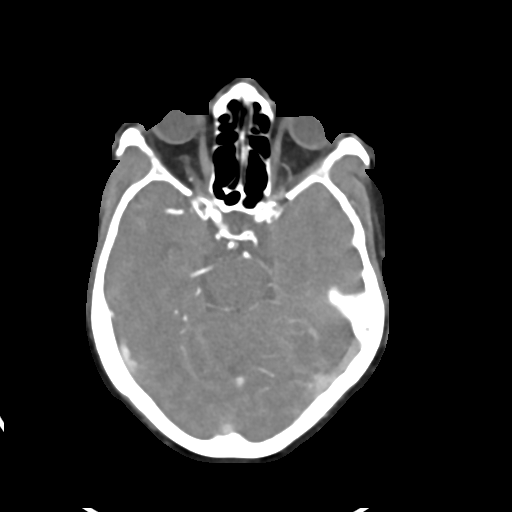

[Series 7: cta neck axial · axial · 0.39mm/px · z∈[-315,-97]mm · 6 of 307 slices shown]
[im 44/307  soft-tissue]
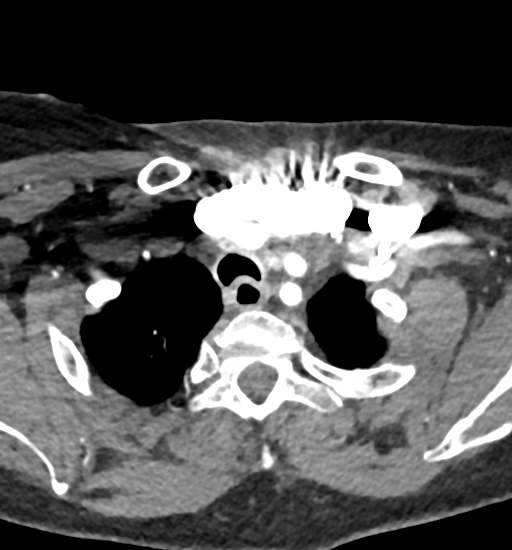
[im 88/307  bone]
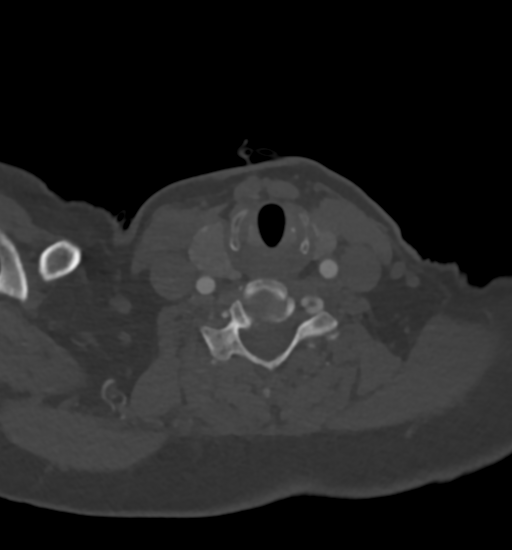
[im 132/307  soft-tissue]
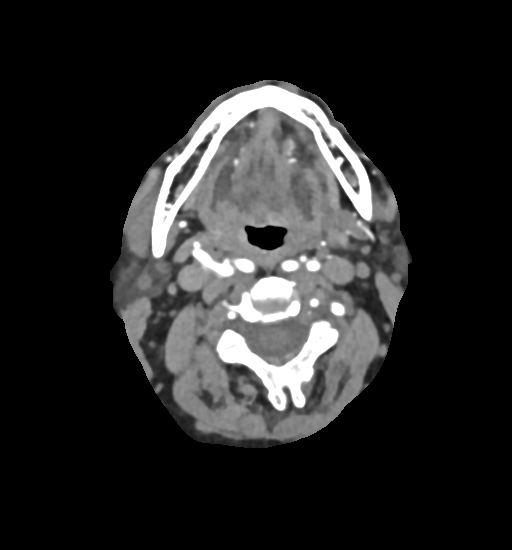
[im 175/307  bone]
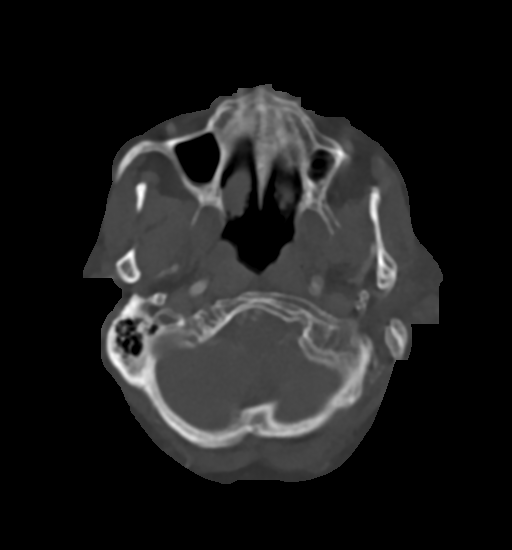
[im 219/307  soft-tissue]
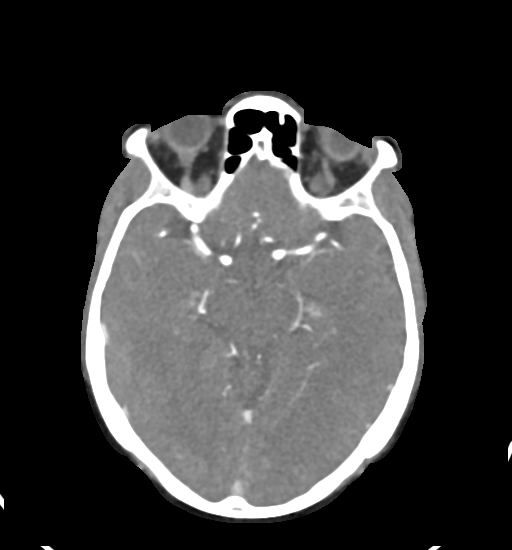
[im 263/307  bone]
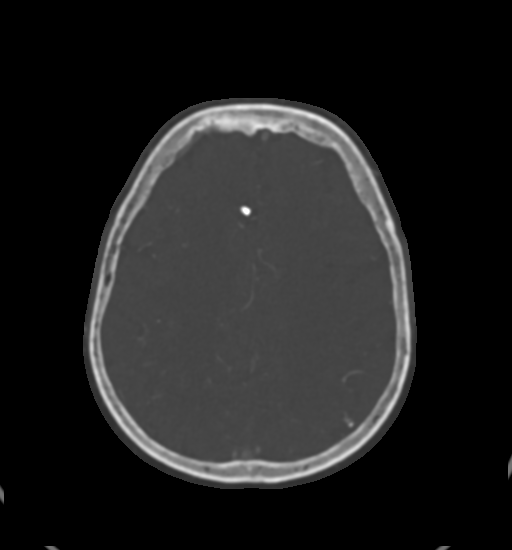

[Series 9: cta neck sagittal · sagittal · 0.41mm/px · 2 of 201 slices shown]
[im 39/201  soft-tissue]
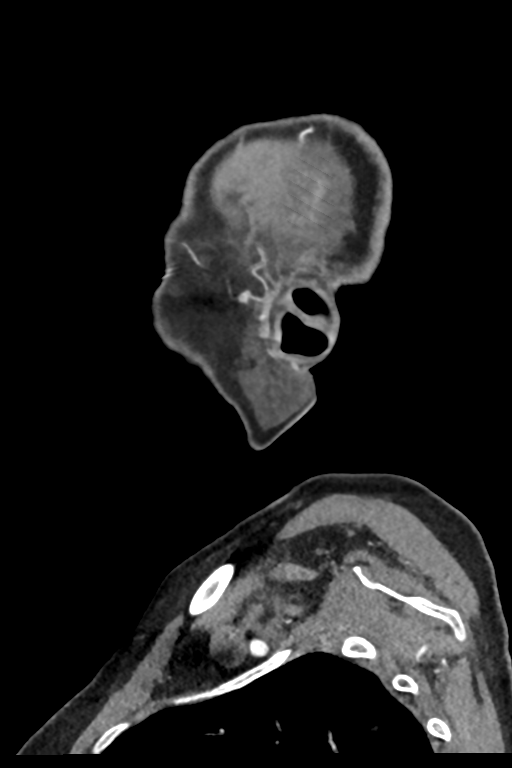
[im 163/201  soft-tissue]
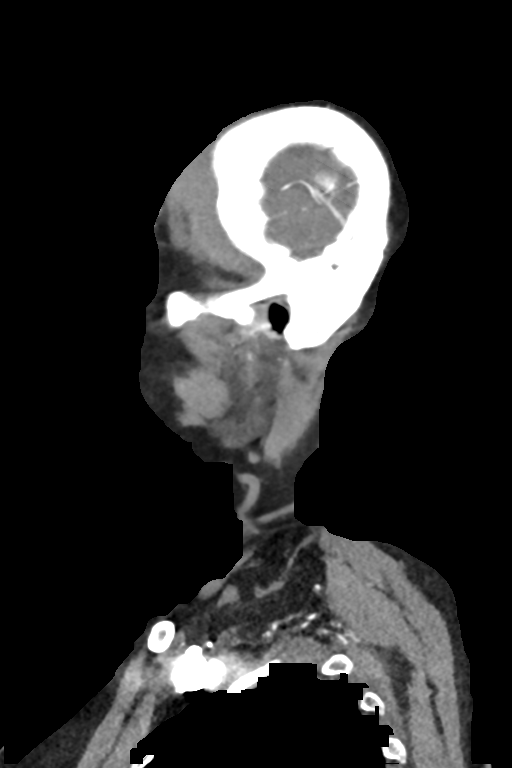

[10 of 36 positions shown; findings below may reference images not displayed]

FINDINGS: CTA NECK FINDINGS

Aortic arch: Visualized aortic arch normal in caliber with normal
branch pattern. Bovine branching pattern with common origin of the
right brachiocephalic and left common carotid artery noted. No
hemodynamically significant stenosis seen about the origin of the
great vessels. Subclavian arteries widely patent.

Right carotid system: Right common and internal carotid arteries
widely patent to the skull base without stenosis, dissection or
occlusion. Proximal right ICA medialized into the retropharyngeal
space. Focal tortuosity the distal cervical right ICA noted.

Left carotid system: Left common and internal carotid arteries
widely patent to the skull base without stenosis, dissection or
occlusion. Proximal left ICA medialized into the retropharyngeal
space. Evaluation the distal cervical left ICA limited by motion.

Vertebral arteries: Both vertebral arteries arise from the
subclavian arteries. Vertebral arteries mildly tortuous but widely
patent within the neck without stenosis, dissection or occlusion.

Skeleton: No acute osseous abnormality. No discrete lytic or blastic
osseous lesions. Prominent degenerative changes noted at the C1-2
articulation, greater on the right.

Other neck: No other acute soft tissue abnormality within the neck.
No mass lesion or adenopathy.

Upper chest: Mild scattered atelectatic changes noted within the
visualized lungs. Visualized upper chest demonstrates no acute
finding.

Review of the MIP images confirms the above findings

CTA HEAD FINDINGS

Anterior circulation: Examination mildly degraded by motion
artifact. Petrous, cavernous, and supraclinoid ICAs widely patent
without flow-limiting stenosis. ICA termini well perfused. A1
segments patent bilaterally. Normal anterior communicating artery
complex. Anterior cerebral arteries widely patent to their distal
aspects without stenosis.

Right M1 widely patent. Normal right MCA bifurcation. Distal right
MCA branches well perfused.

Left M1 widely patent. Normal left MCA bifurcation. Occlusive
thrombus seen within a proximal left M2 branch, superior division
(series 7, image 94), acute in nature. Attenuated opacification of
left MCA branches distally, either due to subocclusive thrombus
and/or moderate collateralization. Remainder of the left MCA
branches remain patent.

Posterior circulation: Vertebral arteries grossly patent to the
vertebrobasilar junction without appreciable stenosis, although
evaluation limited by motion. Neither PICA well visualized. Basilar
diminutive but patent to its distal aspect without appreciable
stenosis. Superior cerebral arteries patent bilaterally. Predominant
fetal type origin of both PCAs, supplied via widely patent and
robust posterior communicating arteries. Both PCAs well perfused to
their distal aspects.

Venous sinuses: Grossly patent allowing for timing the contrast
bolus.

Anatomic variants: Fetal type origin of the PCAs with overall
diminutive vertebrobasilar system. No appreciable aneurysm.

Review of the MIP images confirms the above findings
IMPRESSION: 1. Positive CTA for emergent large vessel occlusion, with occlusive
thrombus within a proximal left M2 branch, superior division.
Attenuated flow distally either due to subocclusive thrombus and/or
collateralization.
2. Diffuse tortuosity about the major arterial vasculature of the
head and neck, suggesting chronic underlying hypertension. No other
hemodynamically significant or correctable stenosis.
3. Fetal type origin of the PCAs with overall diminutive
vertebrobasilar system.

Critical Value/emergent results were called by telephone at the time
of interpretation on [DATE] at [DATE] to provider NII AKWEI
, who verbally acknowledged these results.

## 2019-07-19 IMAGING — CT CT HEAD CODE STROKE
3 series · 14 of 47 positions shown, 16 images · non-contrast
Comparison: None.

CLINICAL DATA: Code stroke. Initial evaluation for right-sided
facial droop with right-sided weakness.

EXAM:
CT HEAD WITHOUT CONTRAST
TECHNIQUE: Contiguous axial images were obtained from the base of the skull
through the vertex without intravenous contrast.

[Series 3: head 5.0 st · axial · 0.39mm/px · z∈[-142,-16]mm · 8 of 31 slices shown, 10 images]
[im 3/31  brain]
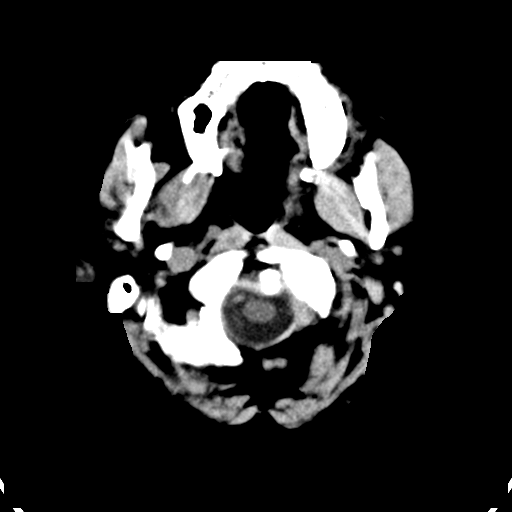
[im 3/31  bone]
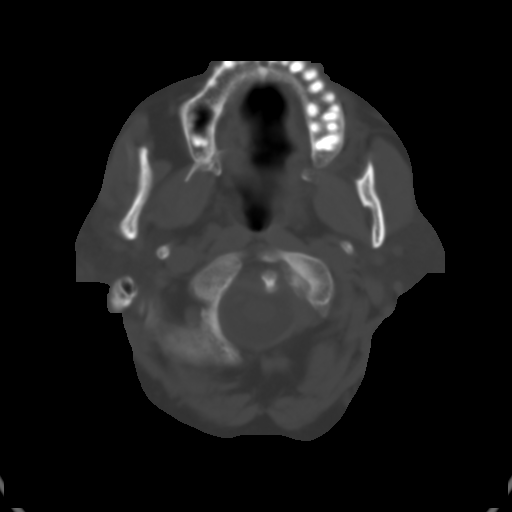
[im 7/31  brain]
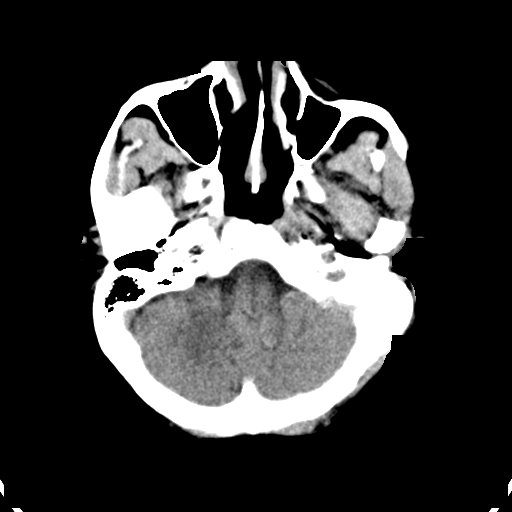
[im 10/31  brain]
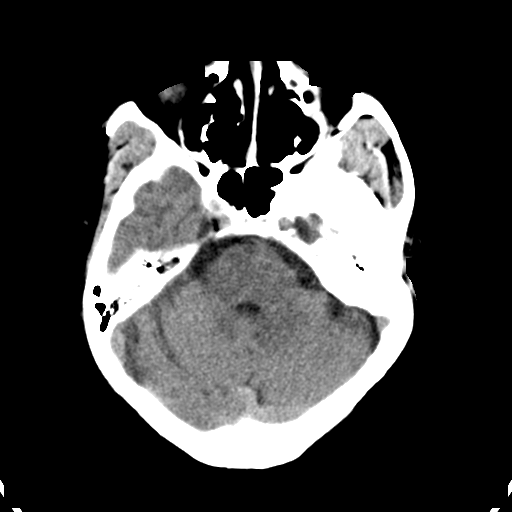
[im 14/31  brain]
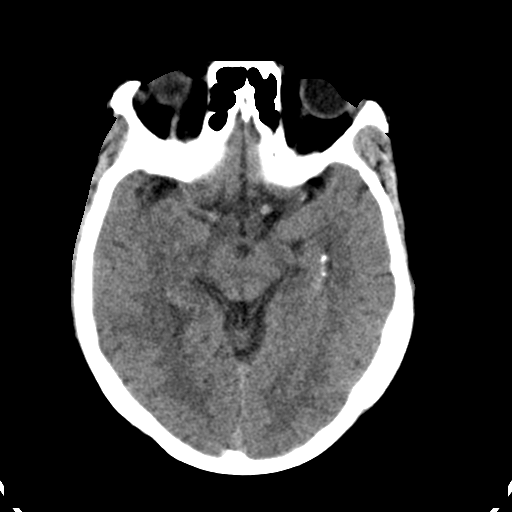
[im 17/31  brain]
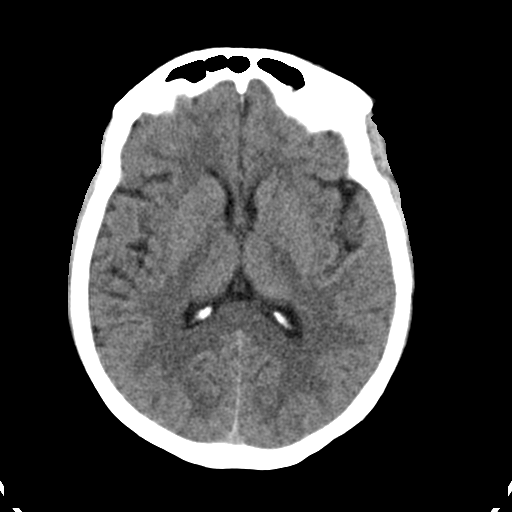
[im 17/31  bone]
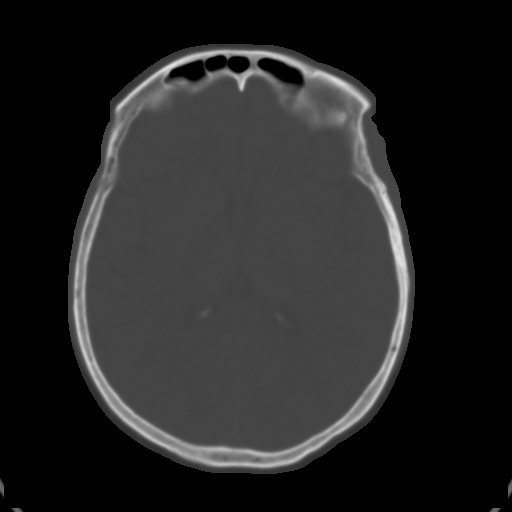
[im 21/31  brain]
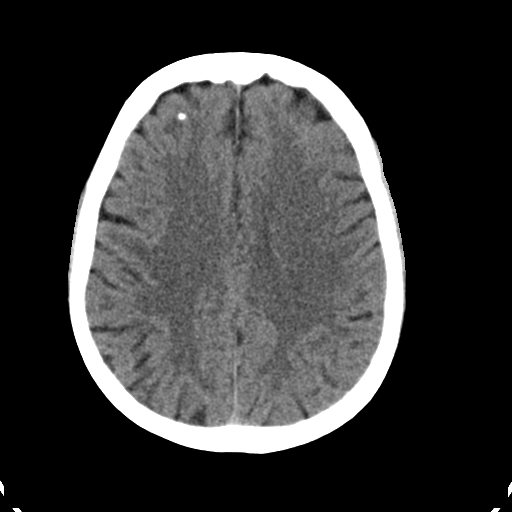
[im 24/31  brain]
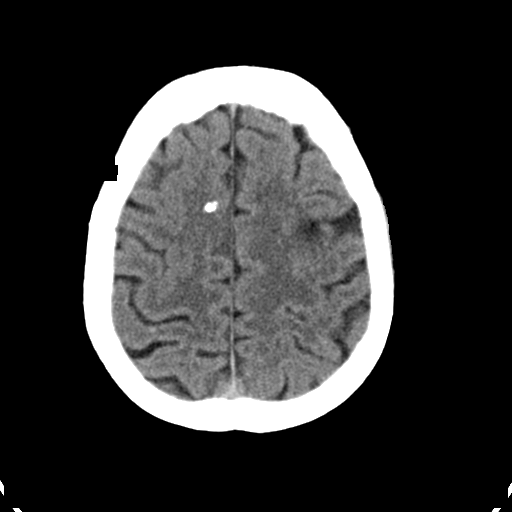
[im 28/31  brain]
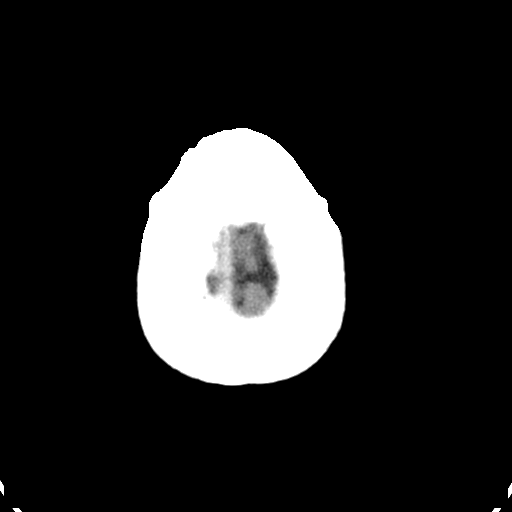

[Series 5: head 3.0 cor st · coronal · 0.31mm/px · 3 of 67 slices shown]
[im 23/67  brain]
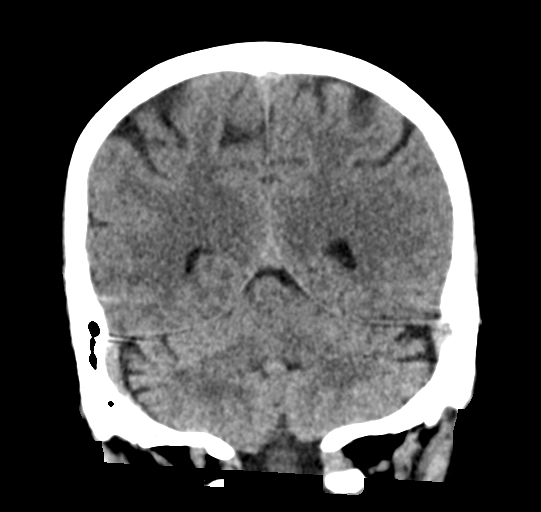
[im 30/67  brain]
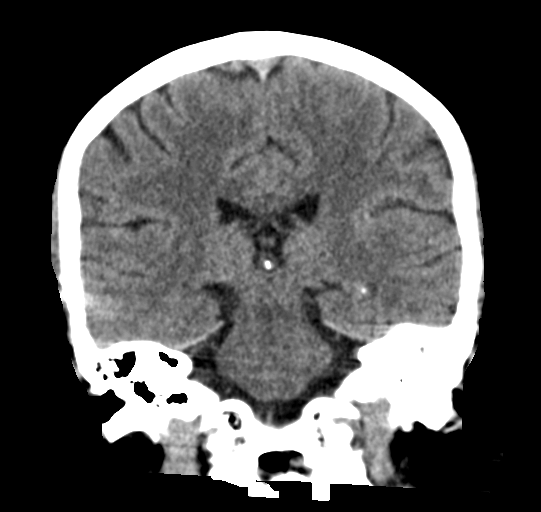
[im 37/67  brain]
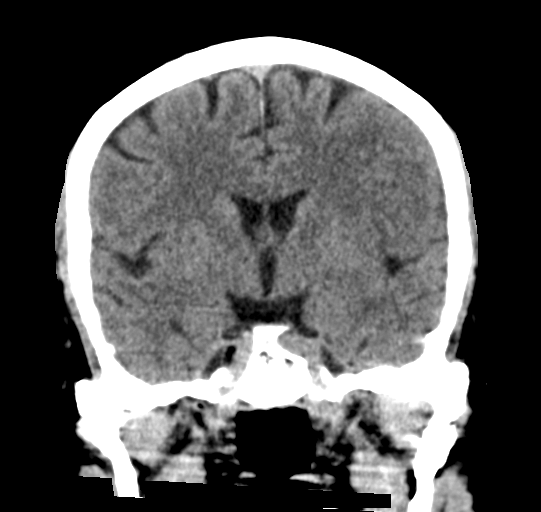

[Series 6: head 3.0 sag st · sagittal · 0.31mm/px · 3 of 66 slices shown]
[im 22/66  brain]
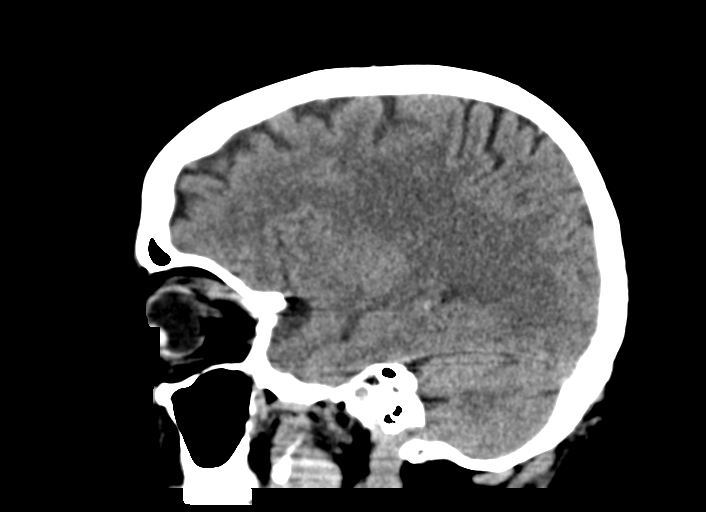
[im 33/66  brain]
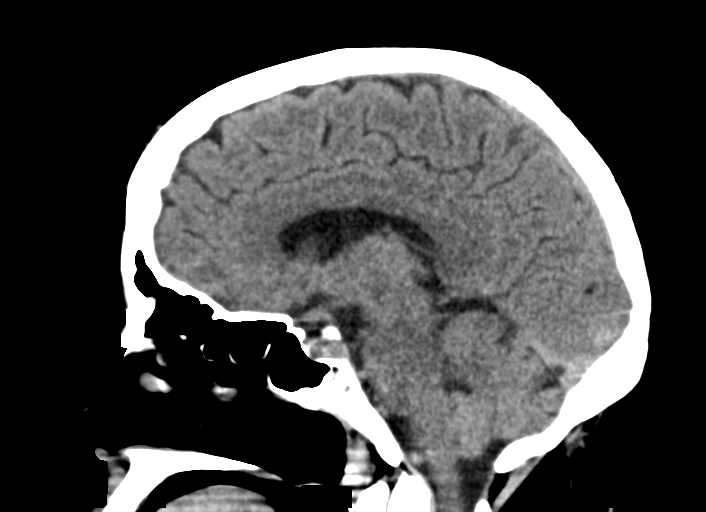
[im 44/66  brain]
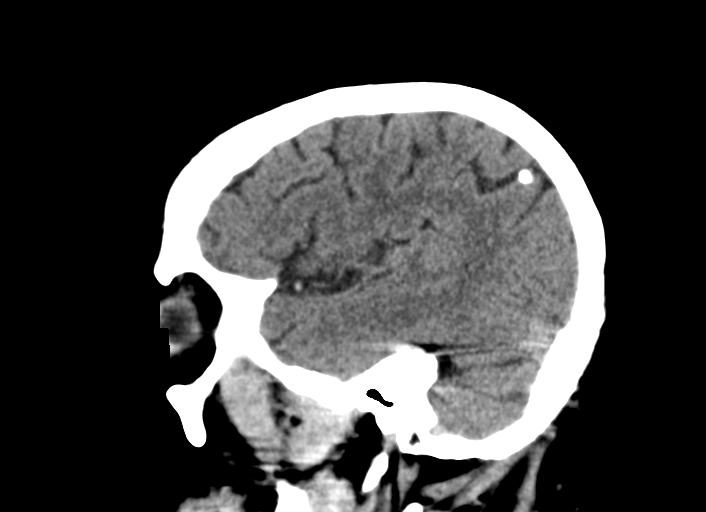

[14 of 47 positions shown; findings below may reference images not displayed]

FINDINGS: Brain: Mild age-related cerebral atrophy. Small focus of
encephalomalacia involving the cortical/subcortical high left
frontal lobe consistent with a small chronic left MCA territory
infarct (series 3, image 25). Few scattered parenchymal
calcifications noted at the right frontal and left parietal region.

There is subtle loss of gray-white matter differentiation involving
the region of the left frontal operculum, suspicious for
early/developing acute left MCA territory infarct. Left insula
relatively well maintained at this time as are the deep gray nuclei.
No intracranial hemorrhage or mass effect. No other acute large
vessel territory infarct. No mass lesion or midline shift. No
hydrocephalus. No extra-axial fluid collection.

Vascular: Asymmetric hyperdensity seen M2 branch (series 6, image
45), suspicious for LV of. Of distal left M1 and/or proximal

Skull: Scalp soft tissues and calvarium within normal limits.

Sinuses/Orbits: Left gaze noted. Globes and orbital soft tissues
otherwise unremarkable. Mild mucosal thickening noted within the
right sphenoid sinus. Chronic appearing left mastoid and middle ear
effusion noted. Visualized nasopharynx within normal limits.

Other: None.

ASPECTS (Alberta Stroke Program Early CT Score)

- Ganglionic level infarction (caudate, lentiform nuclei, internal
capsule, insula, M1-M3 cortex): 7

- Supraganglionic infarction (M4-M6 cortex): 2

Total score (0-10 with 10 being normal): 9
IMPRESSION: 1. Subtle loss of gray-white matter differentiation involving the
left frontal operculum, concerning for acute left MCA territory
infarct. Hyperdensity at the level of a distal left M1/proximal M2
branch concerning for LVO. Further assessment with dedicated CTA
recommended. No intracranial hemorrhage.
2. ASPECTS is 9.
3. Small chronic high left frontal infarct.

Critical Value/emergent results were called by telephone at the time
of interpretation on [DATE] at [DATE] to provider DONNA DEVON
, who verbally acknowledged these results.

## 2019-07-19 MED ORDER — SODIUM CHLORIDE 0.9 % IV SOLN
50.0000 mL | Freq: Once | INTRAVENOUS | Status: DC
  Administered 2019-07-20: 01:00:00 50 mL via INTRAVENOUS

## 2019-07-19 MED ORDER — ALTEPLASE (STROKE) FULL DOSE INFUSION
0.9000 mg/kg | Freq: Once | INTRAVENOUS | Status: AC
  Administered 2019-07-19: 23:00:00 61.9 mg via INTRAVENOUS
  Filled 2019-07-19: qty 100

## 2019-07-19 MED ORDER — IOHEXOL 350 MG/ML SOLN
80.0000 mL | Freq: Once | INTRAVENOUS | Status: AC | PRN
  Administered 2019-07-19: 23:00:00 80 mL via INTRAVENOUS

## 2019-07-19 NOTE — ED Triage Notes (Signed)
Pt brought to ED by [facility] from home for Code Stroke last seen well by family today at 21:50, at 22:15 pt was found acting wear moving only left side and right side completely flaccid, pt no able to talk, only looking to the left side and facial weakness. BP 138/100, HR 78, A fib on the monitor CBG 126, temp 98.

## 2019-07-19 NOTE — Progress Notes (Signed)
PHARMACIST CODE STROKE RESPONSE  Notified to mix tPA at 2257 by Dr. Cheral Marker Delivered tPA to RN at 2300  tPA dose = 6.2mg  bolus over 1 minute followed by 55.7mg  for a total dose of 61.9mg  over 1 hour  Issues/delays encountered (if applicable): Head CT and weight  Duanne Limerick PharmD. BCPS  07/19/19 11:25 PM

## 2019-07-19 NOTE — Code Documentation (Addendum)
Responded to Code Stroke called at 2242 for AMS, R facial droop, and R sided weakness, [id number].  Pt arrived at 2254. CBG-105, NIH-25, CT head negative for acute changes. TPA started at 2317.  CTA>+ LVO. Pt prepped for IR and taken to IR suite at 0000 for intubation.

## 2019-07-19 NOTE — ED Provider Notes (Signed)
[facility] Provider Note   CSN: [id number] Arrival date & time: 07/19/19  2254     History Chief Complaint  Patient presents with  . Code Stroke    Jamie Mitchell Jamie Mitchell is a 74 y.o. female.  The history is provided by the patient and medical records.   LEVEL V CAVEAT:  ACUITY OF CONDITION  74 y.o. F with hx of HTN, thyroid disease, DJD, presenting to the ED as a CODE STROKE.  Last known well 2150, family found her at 2215 thrashing in bed with right sided weakness, facial droop, left gaze, and difficulty getting words out.  Code stroke activated in the field.  On arrival, continues to have deficits without change.  No hx of stroke or TIA in the past per son at bedside.  Does have remote history of AFIB but is not currently on anticoagulation.  Past Medical History:  Diagnosis Date  . Hypertension   . Thyroid disease     Patient Active Problem List   Diagnosis Date Noted  . Hypertension 07/23/2015  . Hyperlipidemia 07/23/2015  . 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 Hypothyroidism 01/25/2014    No past surgical history on file.   OB History   No obstetric history on file.     No family history on file.  Social History   Tobacco Use  . Smoking status: Never Smoker  . Smokeless tobacco: Never Used  Substance Use Topics  . Alcohol use: No  . Drug use: No    Home Medications Prior to Admission medications   Medication Sig Start Date End Date Taking? Authorizing Provider  aspirin 81 MG tablet Take 1 tablet (81 mg total) by mouth daily. Reported on 07/31/2015 02/26/17   Alfonse Spruce, FNP  atorvastatin (LIPITOR) 40 MG tablet Take 1 tablet (40 mg total) by mouth daily. 03/04/17   Alfonse Spruce, FNP  hydrochlorothiazide (HYDRODIURIL) 25 MG tablet Take 1 tablet (25 mg total) by mouth daily. 02/26/17   Alfonse Spruce, FNP  levothyroxine (SYNTHROID, LEVOTHROID) 75 MCG tablet Take 1 tablet (75  mcg total) by mouth daily. 03/04/17   Alfonse Spruce, FNP  naproxen (NAPROSYN) 500 MG tablet Take 1 tablet (500 mg total) by mouth every 12 (twelve) hours as needed for moderate pain (Take with food.). 02/26/17   Alfonse Spruce, FNP  traMADol (ULTRAM) 50 MG tablet Take 1 tablet (50 mg total) by mouth every 12 (twelve) hours as needed for severe pain (Take with food). 02/26/17   Alfonse Spruce, FNP    Allergies    Patient has no known allergies.  Review of Systems   Review of Systems  Unable to perform ROS: Acuity of condition    Physical Exam Updated Vital Signs BP 115/81   Pulse (!) 101   Temp (!) 97.4 F (36.3 C) (Oral)   Resp 18   Ht 5\' 2"  (1.575 m)   Wt 68.8 kg   SpO2 97%   BMI 27.74 kg/m   Physical Exam Vitals and nursing note reviewed.  Constitutional:      Appearance: She is well-developed.  HENT:     Head: Normocephalic and atraumatic.  Eyes:     Conjunctiva/sclera: Conjunctivae normal.     Pupils: Pupils are equal, round, and reactive to light.  Cardiovascular:     Rate and Rhythm: Normal rate and regular rhythm.     Heart sounds: Normal heart sounds.  Pulmonary:  Effort: Pulmonary effort is normal.     Breath sounds: Normal breath sounds.  Abdominal:     General: Bowel sounds are normal.     Palpations: Abdomen is soft.  Musculoskeletal:        General: Normal range of motion.     Cervical back: Normal range of motion.  Skin:    General: Skin is warm and dry.  Neurological:     Comments: Awake, alert, dense RUE weakness with 3/5 strength of RLE; normal strength and ROM on left, expressive aphasia noted, left gaze preference     ED Results / Procedures / Treatments   Labs (all labs ordered are listed, but only abnormal results are displayed) Labs Reviewed  COMPREHENSIVE METABOLIC PANEL - Abnormal; Notable for the following components:      Result Value   Glucose, Bld 105 (*)    All other components within normal limits  CBG  MONITORING, ED - Abnormal; Notable for the following components:   Glucose-Capillary 102 (*)    All other components within normal limits  I-STAT CHEM 8, ED - Abnormal; Notable for the following components:   Glucose, Bld 101 (*)    All other components within normal limits  RESPIRATORY PANEL BY RT PCR (FLU A&B, COVID)  ETHANOL  PROTIME-INR  APTT  CBC  DIFFERENTIAL  RAPID URINE DRUG SCREEN, HOSP PERFORMED  URINALYSIS, ROUTINE W REFLEX MICROSCOPIC  POC SARS CORONAVIRUS 2 AG -  ED    EKG None  Radiology CT Code Stroke CTA Head W/WO contrast  Result Date: 07/19/2019 CLINICAL DATA:  Initial evaluation for acute right-sided weakness. EXAM: CT ANGIOGRAPHY HEAD AND NECK TECHNIQUE: Multidetector CT imaging of the head and neck was performed using the standard protocol during bolus administration of intravenous contrast. Multiplanar CT image reconstructions and MIPs were obtained to evaluate the vascular anatomy. Carotid stenosis measurements (when applicable) are obtained utilizing NASCET criteria, using the distal internal carotid diameter as the denominator. CONTRAST:  24mL OMNIPAQUE IOHEXOL 350 MG/ML SOLN COMPARISON:  Prior head CT from earlier same day. FINDINGS: CTA NECK FINDINGS Aortic arch: Visualized aortic arch normal in caliber with normal branch pattern. Bovine branching pattern with common origin of the right brachiocephalic and left common carotid artery noted. No hemodynamically significant stenosis seen about the origin of the great vessels. Subclavian arteries widely patent. Right carotid system: Right common and internal carotid arteries widely patent to the skull base without stenosis, dissection or occlusion. Proximal right ICA medialized into the retropharyngeal space. Focal tortuosity the distal cervical right ICA noted. Left carotid system: Left common and internal carotid arteries widely patent to the skull base without stenosis, dissection or occlusion. Proximal left ICA  medialized into the retropharyngeal space. Evaluation the distal cervical left ICA limited by motion. Vertebral arteries: Both vertebral arteries arise from the subclavian arteries. Vertebral arteries mildly tortuous but widely patent within the neck without stenosis, dissection or occlusion. Skeleton: No acute osseous abnormality. No discrete lytic or blastic osseous lesions. Prominent degenerative changes noted at the C1-2 articulation, greater on the right. Other neck: No other acute soft tissue abnormality within the neck. No mass lesion or adenopathy. Upper chest: Mild scattered atelectatic changes noted within the visualized lungs. Visualized upper chest demonstrates no acute finding. Review of the MIP images confirms the above findings CTA HEAD FINDINGS Anterior circulation: Examination mildly degraded by motion artifact. Petrous, cavernous, and supraclinoid ICAs widely patent without flow-limiting stenosis. ICA termini well perfused. A1 segments patent bilaterally. Normal anterior communicating artery  complex. Anterior cerebral arteries widely patent to their distal aspects without stenosis. Right M1 widely patent. Normal right MCA bifurcation. Distal right MCA branches well perfused. Left M1 widely patent. Normal left MCA bifurcation. Occlusive thrombus seen within a proximal left M2 branch, superior division (series 7, image 94), acute in nature. Attenuated opacification of left MCA branches distally, either due to subocclusive thrombus and/or moderate collateralization. Remainder of the left MCA branches remain patent. Posterior circulation: Vertebral arteries grossly patent to the vertebrobasilar junction without appreciable stenosis, although evaluation limited by motion. Neither PICA well visualized. Basilar diminutive but patent to its distal aspect without appreciable stenosis. Superior cerebral arteries patent bilaterally. Predominant fetal type origin of both PCAs, supplied via widely patent and  robust posterior communicating arteries. Both PCAs well perfused to their distal aspects. Venous sinuses: Grossly patent allowing for timing the contrast bolus. Anatomic variants: Fetal type origin of the PCAs with overall diminutive vertebrobasilar system. No appreciable aneurysm. Review of the MIP images confirms the above findings IMPRESSION: 1. Positive CTA for emergent large vessel occlusion, with occlusive thrombus within a proximal left M2 branch, superior division. Attenuated flow distally either due to subocclusive thrombus and/or collateralization. 2. Diffuse tortuosity about the major arterial vasculature of the head and neck, suggesting chronic underlying hypertension. No other hemodynamically significant or correctable stenosis. 3. Fetal type origin of the PCAs with overall diminutive vertebrobasilar system. Critical Value/emergent results were called by telephone at the time of interpretation on 07/19/2019 at 11:26 pm to provider ERIC [facility] , who verbally acknowledged these results. Electronically Signed   By: Jeannine Boga M.D.   On: 07/19/2019 23:53   CT Code Stroke CTA Neck W/WO contrast  Result Date: 07/19/2019 CLINICAL DATA:  Initial evaluation for acute right-sided weakness. EXAM: CT ANGIOGRAPHY HEAD AND NECK TECHNIQUE: Multidetector CT imaging of the head and neck was performed using the standard protocol during bolus administration of intravenous contrast. Multiplanar CT image reconstructions and MIPs were obtained to evaluate the vascular anatomy. Carotid stenosis measurements (when applicable) are obtained utilizing NASCET criteria, using the distal internal carotid diameter as the denominator. CONTRAST:  37mL OMNIPAQUE IOHEXOL 350 MG/ML SOLN COMPARISON:  Prior head CT from earlier same day. FINDINGS: CTA NECK FINDINGS Aortic arch: Visualized aortic arch normal in caliber with normal branch pattern. Bovine branching pattern with common origin of the right brachiocephalic and left  common carotid artery noted. No hemodynamically significant stenosis seen about the origin of the great vessels. Subclavian arteries widely patent. Right carotid system: Right common and internal carotid arteries widely patent to the skull base without stenosis, dissection or occlusion. Proximal right ICA medialized into the retropharyngeal space. Focal tortuosity the distal cervical right ICA noted. Left carotid system: Left common and internal carotid arteries widely patent to the skull base without stenosis, dissection or occlusion. Proximal left ICA medialized into the retropharyngeal space. Evaluation the distal cervical left ICA limited by motion. Vertebral arteries: Both vertebral arteries arise from the subclavian arteries. Vertebral arteries mildly tortuous but widely patent within the neck without stenosis, dissection or occlusion. Skeleton: No acute osseous abnormality. No discrete lytic or blastic osseous lesions. Prominent degenerative changes noted at the C1-2 articulation, greater on the right. Other neck: No other acute soft tissue abnormality within the neck. No mass lesion or adenopathy. Upper chest: Mild scattered atelectatic changes noted within the visualized lungs. Visualized upper chest demonstrates no acute finding. Review of the MIP images confirms the above findings CTA HEAD FINDINGS Anterior circulation: Examination mildly degraded  by motion artifact. Petrous, cavernous, and supraclinoid ICAs widely patent without flow-limiting stenosis. ICA termini well perfused. A1 segments patent bilaterally. Normal anterior communicating artery complex. Anterior cerebral arteries widely patent to their distal aspects without stenosis. Right M1 widely patent. Normal right MCA bifurcation. Distal right MCA branches well perfused. Left M1 widely patent. Normal left MCA bifurcation. Occlusive thrombus seen within a proximal left M2 branch, superior division (series 7, image 94), acute in nature. Attenuated  opacification of left MCA branches distally, either due to subocclusive thrombus and/or moderate collateralization. Remainder of the left MCA branches remain patent. Posterior circulation: Vertebral arteries grossly patent to the vertebrobasilar junction without appreciable stenosis, although evaluation limited by motion. Neither PICA well visualized. Basilar diminutive but patent to its distal aspect without appreciable stenosis. Superior cerebral arteries patent bilaterally. Predominant fetal type origin of both PCAs, supplied via widely patent and robust posterior communicating arteries. Both PCAs well perfused to their distal aspects. Venous sinuses: Grossly patent allowing for timing the contrast bolus. Anatomic variants: Fetal type origin of the PCAs with overall diminutive vertebrobasilar system. No appreciable aneurysm. Review of the MIP images confirms the above findings IMPRESSION: 1. Positive CTA for emergent large vessel occlusion, with occlusive thrombus within a proximal left M2 branch, superior division. Attenuated flow distally either due to subocclusive thrombus and/or collateralization. 2. Diffuse tortuosity about the major arterial vasculature of the head and neck, suggesting chronic underlying hypertension. No other hemodynamically significant or correctable stenosis. 3. Fetal type origin of the PCAs with overall diminutive vertebrobasilar system. Critical Value/emergent results were called by telephone at the time of interpretation on 07/19/2019 at 11:26 pm to provider ERIC [facility] , who verbally acknowledged these results. Electronically Signed   By: Jeannine Boga M.D.   On: 07/19/2019 23:53   CT HEAD CODE STROKE WO CONTRAST  Result Date: 07/19/2019 CLINICAL DATA:  Code stroke. Initial evaluation for right-sided facial droop with right-sided weakness. EXAM: CT HEAD WITHOUT CONTRAST TECHNIQUE: Contiguous axial images were obtained from the base of the skull through the vertex without  intravenous contrast. COMPARISON:  None. FINDINGS: Brain: Mild age-related cerebral atrophy. Small focus of encephalomalacia involving the cortical/subcortical high left frontal lobe consistent with a small chronic left MCA territory infarct (series 3, image 25). Few scattered parenchymal calcifications noted at the right frontal and left parietal region. There is subtle loss of gray-white matter differentiation involving the region of the left frontal operculum, suspicious for early/developing acute left MCA territory infarct. Left insula relatively well maintained at this time as are the deep gray nuclei. No intracranial hemorrhage or mass effect. No other acute large vessel territory infarct. No mass lesion or midline shift. No hydrocephalus. No extra-axial fluid collection. Vascular: Asymmetric hyperdensity seen M2 branch (series 6, image 45), suspicious for LV of. Of distal left M1 and/or proximal Skull: Scalp soft tissues and calvarium within normal limits. Sinuses/Orbits: Left gaze noted. Globes and orbital soft tissues otherwise unremarkable. Mild mucosal thickening noted within the right sphenoid sinus. Chronic appearing left mastoid and middle ear effusion noted. Visualized nasopharynx within normal limits. Other: None. ASPECTS [facility] Stroke Program Early CT Score) - Ganglionic level infarction (caudate, lentiform nuclei, internal capsule, insula, M1-M3 cortex): 7 - Supraganglionic infarction (M4-M6 cortex): 2 Total score (0-10 with 10 being normal): 9 IMPRESSION: 1. Subtle loss of gray-white matter differentiation involving the left frontal operculum, concerning for acute left MCA territory infarct. Hyperdensity at the level of a distal left M1/proximal M2 branch concerning for LVO. Further assessment with  dedicated CTA recommended. No intracranial hemorrhage. 2. ASPECTS is 9. 3. Small chronic high left frontal infarct. Critical Value/emergent results were called by telephone at the time of  interpretation on 07/19/2019 at 11:10 pm to provider ERIC [facility] , who verbally acknowledged these results. Electronically Signed   By: Jeannine Boga M.D.   On: 07/19/2019 23:35    Procedures Procedures (including critical care time)  CRITICAL CARE Performed by: Larene Pickett   Total critical care time: 45 minutes  Critical care time was exclusive of separately billable procedures and treating other patients.  Critical care was necessary to treat or prevent imminent or life-threatening deterioration.  Critical care was time spent personally by me on the following activities: development of treatment plan with patient and/or surrogate as well as nursing, discussions with consultants, evaluation of patient's response to treatment, examination of patient, obtaining history from patient or surrogate, ordering and performing treatments and interventions, ordering and review of laboratory studies, ordering and review of radiographic studies, pulse oximetry and re-evaluation of patient's condition.   Medications Ordered in ED Medications  alteplase (ACTIVASE) 1 mg/mL infusion 61.9 mg (61.9 mg Intravenous New Bag/Given 07/19/19 2317)    Followed by  0.9 %  sodium chloride infusion (has no administration in time range)  tirofiban (AGGRASTAT) 5-0.9 MG/100ML-% injection (has no administration in time range)  aspirin 81 MG chewable tablet (has no administration in time range)  verapamil (ISOPTIN) 2.5 MG/ML injection (has no administration in time range)  ticagrelor (BRILINTA) 90 MG tablet (has no administration in time range)  clopidogrel (PLAVIX) 300 MG tablet (has no administration in time range)  iohexol (OMNIPAQUE) 240 MG/ML injection (has no administration in time range)  eptifibatide (INTEGRILIN) 20 MG/10ML injection (has no administration in time range)  ceFAZolin (ANCEF) 2-4 GM/100ML-% IVPB (has no administration in time range)  ticagrelor (BRILINTA) 90 MG tablet (has no  administration in time range)  iohexol (OMNIPAQUE) 350 MG/ML injection 80 mL (80 mLs Intravenous Contrast Given 07/19/19 2317)  fentaNYL (SUBLIMAZE) 100 MCG/2ML injection (  Override pull for Anesthesia 07/20/19 0031)    ED Course  I have reviewed the triage vital signs and the nursing notes.  Pertinent labs & imaging results that were available during my care of the patient were reviewed by me and considered in my medical decision making (see chart for details).    MDM Rules/Calculators/A&P  74 year old female presenting to the ED as a code stroke.  Last known well 2150, family found her at 2215 with right-sided weakness, facial droop, left gaze, and expressive aphasia.  On arrival, continues to have same deficits.  NIH 25.  Initial screening CT without any evidence of acute bleed, chronic calcifications noted.  tPA was initiated at 2317.  CTA obtained during tPA infusion revealing LVO of left M2.  Patient will be taken to IR for intervention with Dr. Estanislado Pandy.    Dr. Max Fickle with neurology present during exam, has gotten consent from patient's son for all procedures.  Final Clinical Impression(s) / ED Diagnoses Final diagnoses:  Cerebrovascular accident (CVA), unspecified mechanism [facility])    Rx / [place] Orders ED Discharge Orders    None       Larene Pickett, PA-C 07/20/19 0049    Ripley Fraise, MD 07/20/19 [phone]

## 2019-07-19 NOTE — H&P (Addendum)
Admission H&P    Chief Complaint: Acute onset of right sided weakness and aphasia  HPI: Jamie Mitchell is an 75 y.o. female presenting to the ED via EMS as a Code Stroke. LKN was 2150. At 2215 family found patient to be completely flaccid on her right side, thrashing her left side and unable to talk. She was also looking to the left and had facial weakness. BP en route was 138/100, HR 78 with a-fib on the monitor. Of note, she has no documented history of a-fib in Epic. CBG 126 and temp 98.   On arrival to the ED, the above deficits continued to be present.   STAT CT head showed no hemorrhage. 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   Informed consent for tPA was obtained from her son. CTA was performed during tPA infusion, revealing a left M2 occlusion.   On ASA and atorvastatin at home.   LSN: 2150 tPA Given: Yes  EKG: Atrial fibrillation with rapid V-rate Borderline right axis deviation Low voltage, precordial leads Repolarization abnormality, prob rate related Abnormal ekg  Past Medical History:  Diagnosis Date  . Hypertension   . Thyroid disease     History reviewed. No pertinent surgical history.  No family history on file. Social History:  reports that she has never smoked. She has never used smokeless tobacco. She reports that she does not drink alcohol or use drugs.  Allergies: No Known Allergies  Home medications:   ROS: Unable to obtain due to aphasia.   Physical Examination: Blood pressure (!) 123/42, pulse (!) 142, temperature (!) 97.4 F (36.3 C), temperature source Oral, resp. rate 20, height 5\' 2"  (1.575 m), weight 68.8 kg, SpO2 99 %.  HEENT-  [place]/AT  Lungs - Respirations unlabored Abdomen - Nondistended.  Extremities - No edema  Neurologic Examination: Mental Status: Awake and alert with right sided neglect. Unable to  demonstrate comprehension of any commands. Mute except for moaning and unintelligible brief utterances.  Cranial Nerves: II:  No blink to threat on the right. Blinks to threat on the left. PERRL.  III,IV, VI: Leftward gaze deviation. Can track to the midline but cannot cross. No nystagmus. No ptosis.  V,VII: Right facial droop. Decreased responses to right sided stimuli VIII: hearing intact to voice - will turn head towards the direction of questions and commands.  IX,X: Unable to assess.  XI: Head rotated to the left.  XII: Unable to assess Motor/Sensory: Moves LUE purposefully with 5/5 strength.  Withdraws LLE briskly to noxious.  RUE flaccid with no movement to noxious RLE with triple flexion response to plantar stimulation.  Deep Tendon Reflexes:  Left biceps and brachioradialis 2+ Right biceps and brachioradialis 1+ Left patella 3+ Right patella 4+ (crossed adductor) Plantars: Right: downgoing  Left: downgoing Cerebellar/Gait: Unable to assess   Results for orders placed or performed during the hospital encounter of 07/19/19 (from the past 48 hour(s))  CBG monitoring, ED     Status: Abnormal   Collection Time: 07/19/19 10:57 PM  Result Value Ref Range   Glucose-Capillary 102 (H) 70 - 99 mg/dL    Comment: Glucose reference range applies only to samples taken after fasting for at least 8 hours.  Ethanol     Status: None   Collection Time: 07/19/19 11:00 PM  Result Value Ref Range   Alcohol, Ethyl (B) <10 <10 mg/dL    Comment: (NOTE)  Lowest detectable limit for serum alcohol is 10 mg/dL. For medical purposes only. Performed at [facility], [place] [address] [place], [place] [address]   Protime-INR     Status: None   Collection Time: 07/19/19 11:00 PM  Result Value Ref Range   Prothrombin Time 12.9 11.4 - 15.2 seconds   INR 1.0 0.8 - 1.2    Comment: (NOTE) INR goal varies based on device and disease states. Performed at [facility], [place] [address]  [place], [place] [address]   APTT     Status: None   Collection Time: 07/19/19 11:00 PM  Result Value Ref Range   aPTT 30 24 - 36 seconds    Comment: Performed at [facility] [address], [place] [address]  CBC     Status: None   Collection Time: 07/19/19 11:00 PM  Result Value Ref Range   WBC 6.9 4.0 - 10.5 K/uL   RBC 4.91 3.87 - 5.11 MIL/uL   Hemoglobin 14.8 12.0 - 15.0 g/dL   HCT 45.4 36.0 - 46.0 %   MCV 92.5 80.0 - 100.0 fL   MCH 30.1 26.0 - 34.0 pg   MCHC 32.6 30.0 - 36.0 g/dL   RDW 13.9 11.5 - 15.5 %   Platelets 256 150 - 400 K/uL   nRBC 0.0 0.0 - 0.2 %    Comment: Performed at [facility], [place] [address] [place], [place] [address]  Differential     Status: None   Collection Time: 07/19/19 11:00 PM  Result Value Ref Range   Neutrophils Relative % 39 %   Neutro Abs 2.7 1.7 - 7.7 K/uL   Lymphocytes Relative 49 %   Lymphs Abs 3.4 0.7 - 4.0 K/uL   Monocytes Relative 8 %   Monocytes Absolute 0.6 0.1 - 1.0 K/uL   Eosinophils Relative 3 %   Eosinophils Absolute 0.2 0.0 - 0.5 K/uL   Basophils Relative 1 %   Basophils Absolute 0.1 0.0 - 0.1 K/uL   Immature Granulocytes 0 %   Abs Immature Granulocytes 0.02 0.00 - 0.07 K/uL    Comment: Performed at [facility] [address], [place], [place] [address]  Comprehensive metabolic panel     Status: Abnormal   Collection Time: 07/19/19 11:00 PM  Result Value Ref Range   Sodium 140 135 - 145 mmol/L   Potassium 4.3 3.5 - 5.1 mmol/L   Chloride 107 98 - 111 mmol/L   CO2 24 22 - 32 mmol/L   Glucose, Bld 105 (H) 70 - 99 mg/dL    Comment: Glucose reference range applies only to samples taken after fasting for at least 8 hours.   BUN 18 8 - 23 mg/dL   Creatinine, Ser 0.92 0.44 - 1.00 mg/dL   Calcium 9.2 8.9 - 10.3 mg/dL   Total Protein 7.8 6.5 - 8.1 g/dL   Albumin 4.0 3.5 - 5.0 g/dL   AST 22 15 - 41 U/L   ALT 15 0 - 44 U/L   Alkaline Phosphatase 106 38 - 126 U/L   Total Bilirubin 0.9 0.3 - 1.2 mg/dL     GFR calc non Af Amer >60 >60 mL/min   GFR calc Af Amer >60 >60 mL/min   Anion gap 9 5 - 15    Comment: Performed at [facility], [place] [address], [place], [place] [address]  I-stat chem 8, ED     Status: Abnormal  Collection Time: 07/19/19 11:05 PM  Result Value Ref Range   Sodium 140 135 - 145 mmol/L   Potassium 4.2 3.5 - 5.1 mmol/L   Chloride 105 98 - 111 mmol/L   BUN 19 8 - 23 mg/dL   Creatinine, Ser 0.90 0.44 - 1.00 mg/dL   Glucose, Bld 101 (H) 70 - 99 mg/dL    Comment: Glucose reference range applies only to samples taken after fasting for at least 8 hours.   Calcium, Ion 1.16 1.15 - 1.40 mmol/L   TCO2 26 22 - 32 mmol/L   Hemoglobin 15.0 12.0 - 15.0 g/dL   HCT 44.0 36.0 - 46.0 %   CT Code Stroke CTA Head W/WO contrast  Result Date: 07/19/2019 CLINICAL DATA:  Initial evaluation for acute right-sided weakness. EXAM: CT ANGIOGRAPHY HEAD AND NECK TECHNIQUE: Multidetector CT imaging of the head and neck was performed using the standard protocol during bolus administration of intravenous contrast. Multiplanar CT image reconstructions and MIPs were obtained to evaluate the vascular anatomy. Carotid stenosis measurements (when applicable) are obtained utilizing NASCET criteria, using the distal internal carotid diameter as the denominator. CONTRAST:  16mL OMNIPAQUE IOHEXOL 350 MG/ML SOLN COMPARISON:  Prior head CT from earlier same day. FINDINGS: CTA NECK FINDINGS Aortic arch: Visualized aortic arch normal in caliber with normal branch pattern. Bovine branching pattern with common origin of the right brachiocephalic and left common carotid artery noted. No hemodynamically significant stenosis seen about the origin of the great vessels. Subclavian arteries widely patent. Right carotid system: Right common and internal carotid arteries widely patent to the skull base without stenosis, dissection or occlusion. Proximal right ICA medialized into the retropharyngeal space. Focal tortuosity the  distal cervical right ICA noted. Left carotid system: Left common and internal carotid arteries widely patent to the skull base without stenosis, dissection or occlusion. Proximal left ICA medialized into the retropharyngeal space. Evaluation the distal cervical left ICA limited by motion. Vertebral arteries: Both vertebral arteries arise from the subclavian arteries. Vertebral arteries mildly tortuous but widely patent within the neck without stenosis, dissection or occlusion. Skeleton: No acute osseous abnormality. No discrete lytic or blastic osseous lesions. Prominent degenerative changes noted at the C1-2 articulation, greater on the right. Other neck: No other acute soft tissue abnormality within the neck. No mass lesion or adenopathy. Upper chest: Mild scattered atelectatic changes noted within the visualized lungs. Visualized upper chest demonstrates no acute finding. Review of the MIP images confirms the above findings CTA HEAD FINDINGS Anterior circulation: Examination mildly degraded by motion artifact. Petrous, cavernous, and supraclinoid ICAs widely patent without flow-limiting stenosis. ICA termini well perfused. A1 segments patent bilaterally. Normal anterior communicating artery complex. Anterior cerebral arteries widely patent to their distal aspects without stenosis. Right M1 widely patent. Normal right MCA bifurcation. Distal right MCA branches well perfused. Left M1 widely patent. Normal left MCA bifurcation. Occlusive thrombus seen within a proximal left M2 branch, superior division (series 7, image 94), acute in nature. Attenuated opacification of left MCA branches distally, either due to subocclusive thrombus and/or moderate collateralization. Remainder of the left MCA branches remain patent. Posterior circulation: Vertebral arteries grossly patent to the vertebrobasilar junction without appreciable stenosis, although evaluation limited by motion. Neither PICA well visualized. Basilar  diminutive but patent to its distal aspect without appreciable stenosis. Superior cerebral arteries patent bilaterally. Predominant fetal type origin of both PCAs, supplied via widely patent and robust posterior communicating arteries. Both PCAs well perfused to their distal aspects. Venous sinuses: Grossly patent  allowing for timing the contrast bolus. Anatomic variants: Fetal type origin of the PCAs with overall diminutive vertebrobasilar system. No appreciable aneurysm. Review of the MIP images confirms the above findings IMPRESSION: 1. Positive CTA for emergent large vessel occlusion, with occlusive thrombus within a proximal left M2 branch, superior division. Attenuated flow distally either due to subocclusive thrombus and/or collateralization. 2. Diffuse tortuosity about the major arterial vasculature of the head and neck, suggesting chronic underlying hypertension. No other hemodynamically significant or correctable stenosis. 3. Fetal type origin of the PCAs with overall diminutive vertebrobasilar system. Critical Value/emergent results were called by telephone at the time of interpretation on 07/19/2019 at 11:26 pm to provider [facility] , who verbally acknowledged these results. Electronically Signed   By: Jeannine Boga M.D.   On: 07/19/2019 23:53   CT Code Stroke CTA Neck W/WO contrast  Result Date: 07/19/2019 CLINICAL DATA:  Initial evaluation for acute right-sided weakness. EXAM: CT ANGIOGRAPHY HEAD AND NECK TECHNIQUE: Multidetector CT imaging of the head and neck was performed using the standard protocol during bolus administration of intravenous contrast. Multiplanar CT image reconstructions and MIPs were obtained to evaluate the vascular anatomy. Carotid stenosis measurements (when applicable) are obtained utilizing NASCET criteria, using the distal internal carotid diameter as the denominator. CONTRAST:  68mL OMNIPAQUE IOHEXOL 350 MG/ML SOLN COMPARISON:  Prior head CT from earlier same  day. FINDINGS: CTA NECK FINDINGS Aortic arch: Visualized aortic arch normal in caliber with normal branch pattern. Bovine branching pattern with common origin of the right brachiocephalic and left common carotid artery noted. No hemodynamically significant stenosis seen about the origin of the great vessels. Subclavian arteries widely patent. Right carotid system: Right common and internal carotid arteries widely patent to the skull base without stenosis, dissection or occlusion. Proximal right ICA medialized into the retropharyngeal space. Focal tortuosity the distal cervical right ICA noted. Left carotid system: Left common and internal carotid arteries widely patent to the skull base without stenosis, dissection or occlusion. Proximal left ICA medialized into the retropharyngeal space. Evaluation the distal cervical left ICA limited by motion. Vertebral arteries: Both vertebral arteries arise from the subclavian arteries. Vertebral arteries mildly tortuous but widely patent within the neck without stenosis, dissection or occlusion. Skeleton: No acute osseous abnormality. No discrete lytic or blastic osseous lesions. Prominent degenerative changes noted at the C1-2 articulation, greater on the right. Other neck: No other acute soft tissue abnormality within the neck. No mass lesion or adenopathy. Upper chest: Mild scattered atelectatic changes noted within the visualized lungs. Visualized upper chest demonstrates no acute finding. Review of the MIP images confirms the above findings CTA HEAD FINDINGS Anterior circulation: Examination mildly degraded by motion artifact. Petrous, cavernous, and supraclinoid ICAs widely patent without flow-limiting stenosis. ICA termini well perfused. A1 segments patent bilaterally. Normal anterior communicating artery complex. Anterior cerebral arteries widely patent to their distal aspects without stenosis. Right M1 widely patent. Normal right MCA bifurcation. Distal right MCA  branches well perfused. Left M1 widely patent. Normal left MCA bifurcation. Occlusive thrombus seen within a proximal left M2 branch, superior division (series 7, image 94), acute in nature. Attenuated opacification of left MCA branches distally, either due to subocclusive thrombus and/or moderate collateralization. Remainder of the left MCA branches remain patent. Posterior circulation: Vertebral arteries grossly patent to the vertebrobasilar junction without appreciable stenosis, although evaluation limited by motion. Neither PICA well visualized. Basilar diminutive but patent to its distal aspect without appreciable stenosis. Superior cerebral arteries patent bilaterally. Predominant fetal type  origin of both PCAs, supplied via widely patent and robust posterior communicating arteries. Both PCAs well perfused to their distal aspects. Venous sinuses: Grossly patent allowing for timing the contrast bolus. Anatomic variants: Fetal type origin of the PCAs with overall diminutive vertebrobasilar system. No appreciable aneurysm. Review of the MIP images confirms the above findings IMPRESSION: 1. Positive CTA for emergent large vessel occlusion, with occlusive thrombus within a proximal left M2 branch, superior division. Attenuated flow distally either due to subocclusive thrombus and/or collateralization. 2. Diffuse tortuosity about the major arterial vasculature of the head and neck, suggesting chronic underlying hypertension. No other hemodynamically significant or correctable stenosis. 3. Fetal type origin of the PCAs with overall diminutive vertebrobasilar system. Critical Value/emergent results were called by telephone at the time of interpretation on 07/19/2019 at 11:26 pm to provider Halley Shepheard [facility] , who verbally acknowledged these results. Electronically Signed   By: Jeannine Boga M.D.   On: 07/19/2019 23:53   CT HEAD CODE STROKE WO CONTRAST  Result Date: 07/19/2019 CLINICAL DATA:  Code stroke. Initial  evaluation for right-sided facial droop with right-sided weakness. EXAM: CT HEAD WITHOUT CONTRAST TECHNIQUE: Contiguous axial images were obtained from the base of the skull through the vertex without intravenous contrast. COMPARISON:  None. FINDINGS: Brain: Mild age-related cerebral atrophy. Small focus of encephalomalacia involving the cortical/subcortical high left frontal lobe consistent with a small chronic left MCA territory infarct (series 3, image 25). Few scattered parenchymal calcifications noted at the right frontal and left parietal region. There is subtle loss of gray-white matter differentiation involving the region of the left frontal operculum, suspicious for early/developing acute left MCA territory infarct. Left insula relatively well maintained at this time as are the deep gray nuclei. No intracranial hemorrhage or mass effect. No other acute large vessel territory infarct. No mass lesion or midline shift. No hydrocephalus. No extra-axial fluid collection. Vascular: Asymmetric hyperdensity seen M2 branch (series 6, image 45), suspicious for LV of. Of distal left M1 and/or proximal Skull: Scalp soft tissues and calvarium within normal limits. Sinuses/Orbits: Left gaze noted. Globes and orbital soft tissues otherwise unremarkable. Mild mucosal thickening noted within the right sphenoid sinus. Chronic appearing left mastoid and middle ear effusion noted. Visualized nasopharynx within normal limits. Other: None. ASPECTS [facility] Stroke Program Early CT Score) - Ganglionic level infarction (caudate, lentiform nuclei, internal capsule, insula, M1-M3 cortex): 7 - Supraganglionic infarction (M4-M6 cortex): 2 Total score (0-10 with 10 being normal): 9 IMPRESSION: 1. Subtle loss of gray-white matter differentiation involving the left frontal operculum, concerning for acute left MCA territory infarct. Hyperdensity at the level of a distal left M1/proximal M2 branch concerning for LVO. Further assessment with  dedicated CTA recommended. No intracranial hemorrhage. 2. ASPECTS is 9. 3. Small chronic high left frontal infarct. Critical Value/emergent results were called by telephone at the time of interpretation on 07/19/2019 at 11:10 pm to provider Elaiza Shoberg [facility] , who verbally acknowledged these results. Electronically Signed   By: Jeannine Boga M.D.   On: 07/19/2019 23:35    Assessment: 74 y.o. female presenting with acute onset of right hemiplegia, right sided sensory deficit, right facial droop, left gaze deviation, right visual field cut and dense receptive and expressive aphasia.  1. NIHSS: 25 2. CT head: Subtle loss of gray-white matter differentiation involving the left frontal operculum, concerning for acute left MCA territory infarct. Hyperdensity at the level of a distal left M1/proximal M2 branch concerning for LVO. No intracranial hemorrhage. ASPECTS is 9. Small chronic high left  frontal infarct. 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  3. CTA of head and neck: Positive for emergent large vessel occlusion, with occlusive thrombus within a proximal left M2 branch, superior division. Attenuated flow distally either due to subocclusive thrombus and/or collateralization. Diffuse tortuosity about the major arterial vasculature of the head and neck, suggesting chronic underlying hypertension. No other hemodynamically significant or correctable stenosis. Fetal type origin of the PCAs with overall diminutive vertebrobasilar system.  4. Stroke Risk Factors - HTN, possible new onset atrial fibrillation  Plan: 1. After comprehensive review of possible contraindications, she has no absolute contraindications to tPA administration. Patient is a tPA candidate. Discussed extensively the risks/benefits of tPA treatment vs. no treatment with her son, including risks of hemorrhage and death with tPA administration versus worse overall outcomes on average in patients within tPA time window  who are not administered tPA. The patient's aphasia precludes meaningful medical decision making on her part at this time. Overall benefits of tPA regarding long-term prognosis are felt to outweigh risks. The patient's son expressed understanding and wish to proceed with tPA.  2. The patient is also a VIR candidate due to minimal improvement after tPA infusion completed. Discussed with Dr. Karenann Cai.  3. Following VIR will admitting to the Neuro ICU under the Neurology service. Post-tPA order set to include frequent neuro checks and BP management. No antiplatelet medications or anticoagulants for at least 24 hours following tPA.  4. DVT prophylaxis with SCDs.  5. Continue atorvastatin.  6. Will need escalation of antiplatelet therapy if follow up CT at 24 hours is negative for hemorrhagic conversion. 7. TTE.  8. MRI brain.  9. PT/OT/Speech.  10. NPO until passes swallow evaluation.  12. Telemetry monitoring 13. Fasting lipid panel, HgbA1c  65 minutes spent in the emergent neurological evaluation and management of this critically ill patient.   Electronically signed: Dr. Kerney Elbe 07/19/2019, 11:59 PM

## 2019-07-20 ENCOUNTER — Emergency Department (HOSPITAL_COMMUNITY): Payer: Medicare Other | Admitting: Certified Registered"

## 2019-07-20 ENCOUNTER — Inpatient Hospital Stay (HOSPITAL_COMMUNITY): Payer: Medicare Other

## 2019-07-20 ENCOUNTER — Encounter (HOSPITAL_COMMUNITY)
Admission: EM | Disposition: A | Discharge status: Rehabilitation Facility | Payer: Self-pay | Source: Home / Self Care | Attending: Neurology

## 2019-07-20 ENCOUNTER — Encounter (HOSPITAL_COMMUNITY): Payer: Self-pay | Admitting: Neurology

## 2019-07-20 ENCOUNTER — Emergency Department (HOSPITAL_COMMUNITY): Payer: Medicare Other

## 2019-07-20 DIAGNOSIS — I63412 Cerebral infarction due to embolism of left middle cerebral artery: Secondary | ICD-10-CM | POA: Diagnosis present

## 2019-07-20 DIAGNOSIS — I495 Sick sinus syndrome: Secondary | ICD-10-CM | POA: Diagnosis present

## 2019-07-20 DIAGNOSIS — I4891 Unspecified atrial fibrillation: Secondary | ICD-10-CM | POA: Diagnosis present

## 2019-07-20 DIAGNOSIS — J9600 Acute respiratory failure, unspecified whether with hypoxia or hypercapnia: Secondary | ICD-10-CM

## 2019-07-20 DIAGNOSIS — J9690 Respiratory failure, unspecified, unspecified whether with hypoxia or hypercapnia: Secondary | ICD-10-CM | POA: Diagnosis not present

## 2019-07-20 DIAGNOSIS — I10 Essential (primary) hypertension: Secondary | ICD-10-CM | POA: Diagnosis present

## 2019-07-20 DIAGNOSIS — I340 Nonrheumatic mitral (valve) insufficiency: Secondary | ICD-10-CM | POA: Diagnosis not present

## 2019-07-20 DIAGNOSIS — I63512 Cerebral infarction due to unspecified occlusion or stenosis of left middle cerebral artery: Secondary | ICD-10-CM | POA: Diagnosis not present

## 2019-07-20 DIAGNOSIS — Z978 Presence of other specified devices: Secondary | ICD-10-CM | POA: Diagnosis not present

## 2019-07-20 DIAGNOSIS — R7303 Prediabetes: Secondary | ICD-10-CM | POA: Diagnosis present

## 2019-07-20 DIAGNOSIS — R29725 NIHSS score 25: Secondary | ICD-10-CM | POA: Diagnosis present

## 2019-07-20 DIAGNOSIS — Z9911 Dependence on respirator [ventilator] status: Secondary | ICD-10-CM | POA: Diagnosis not present

## 2019-07-20 DIAGNOSIS — I483 Typical atrial flutter: Secondary | ICD-10-CM | POA: Diagnosis not present

## 2019-07-20 DIAGNOSIS — T85618A Breakdown (mechanical) of other specified internal prosthetic devices, implants and grafts, initial encounter: Secondary | ICD-10-CM | POA: Diagnosis not present

## 2019-07-20 DIAGNOSIS — R338 Other retention of urine: Secondary | ICD-10-CM | POA: Diagnosis not present

## 2019-07-20 DIAGNOSIS — I639 Cerebral infarction, unspecified: Secondary | ICD-10-CM | POA: Diagnosis present

## 2019-07-20 DIAGNOSIS — R1311 Dysphagia, oral phase: Secondary | ICD-10-CM | POA: Diagnosis not present

## 2019-07-20 DIAGNOSIS — I609 Nontraumatic subarachnoid hemorrhage, unspecified: Secondary | ICD-10-CM | POA: Diagnosis present

## 2019-07-20 DIAGNOSIS — I69351 Hemiplegia and hemiparesis following cerebral infarction affecting right dominant side: Secondary | ICD-10-CM | POA: Diagnosis not present

## 2019-07-20 DIAGNOSIS — Y839 Surgical procedure, unspecified as the cause of abnormal reaction of the patient, or of later complication, without mention of misadventure at the time of the procedure: Secondary | ICD-10-CM | POA: Diagnosis not present

## 2019-07-20 DIAGNOSIS — I69391 Dysphagia following cerebral infarction: Secondary | ICD-10-CM | POA: Diagnosis not present

## 2019-07-20 DIAGNOSIS — Z6828 Body mass index (BMI) 28.0-28.9, adult: Secondary | ICD-10-CM | POA: Diagnosis not present

## 2019-07-20 DIAGNOSIS — I6602 Occlusion and stenosis of left middle cerebral artery: Secondary | ICD-10-CM | POA: Diagnosis not present

## 2019-07-20 DIAGNOSIS — I952 Hypotension due to drugs: Secondary | ICD-10-CM

## 2019-07-20 DIAGNOSIS — I4892 Unspecified atrial flutter: Secondary | ICD-10-CM | POA: Diagnosis present

## 2019-07-20 DIAGNOSIS — I517 Cardiomegaly: Secondary | ICD-10-CM | POA: Diagnosis not present

## 2019-07-20 DIAGNOSIS — R471 Dysarthria and anarthria: Secondary | ICD-10-CM | POA: Diagnosis present

## 2019-07-20 DIAGNOSIS — E663 Overweight: Secondary | ICD-10-CM | POA: Diagnosis present

## 2019-07-20 DIAGNOSIS — R339 Retention of urine, unspecified: Secondary | ICD-10-CM | POA: Diagnosis present

## 2019-07-20 DIAGNOSIS — E7800 Pure hypercholesterolemia, unspecified: Secondary | ICD-10-CM | POA: Diagnosis not present

## 2019-07-20 DIAGNOSIS — G8191 Hemiplegia, unspecified affecting right dominant side: Secondary | ICD-10-CM | POA: Diagnosis present

## 2019-07-20 DIAGNOSIS — J9601 Acute respiratory failure with hypoxia: Secondary | ICD-10-CM | POA: Diagnosis not present

## 2019-07-20 DIAGNOSIS — J95821 Acute postprocedural respiratory failure: Secondary | ICD-10-CM | POA: Diagnosis not present

## 2019-07-20 DIAGNOSIS — E785 Hyperlipidemia, unspecified: Secondary | ICD-10-CM | POA: Diagnosis present

## 2019-07-20 DIAGNOSIS — E872 Acidosis: Secondary | ICD-10-CM | POA: Diagnosis not present

## 2019-07-20 DIAGNOSIS — I771 Stricture of artery: Secondary | ICD-10-CM | POA: Diagnosis present

## 2019-07-20 DIAGNOSIS — I480 Paroxysmal atrial fibrillation: Secondary | ICD-10-CM | POA: Diagnosis present

## 2019-07-20 DIAGNOSIS — Z20822 Contact with and (suspected) exposure to covid-19: Secondary | ICD-10-CM | POA: Diagnosis present

## 2019-07-20 DIAGNOSIS — I959 Hypotension, unspecified: Secondary | ICD-10-CM | POA: Diagnosis present

## 2019-07-20 DIAGNOSIS — Z4682 Encounter for fitting and adjustment of non-vascular catheter: Secondary | ICD-10-CM | POA: Diagnosis not present

## 2019-07-20 DIAGNOSIS — R4701 Aphasia: Secondary | ICD-10-CM | POA: Diagnosis present

## 2019-07-20 DIAGNOSIS — I455 Other specified heart block: Secondary | ICD-10-CM

## 2019-07-20 DIAGNOSIS — Z79890 Hormone replacement therapy (postmenopausal): Secondary | ICD-10-CM | POA: Diagnosis not present

## 2019-07-20 DIAGNOSIS — I69320 Aphasia following cerebral infarction: Secondary | ICD-10-CM | POA: Diagnosis not present

## 2019-07-20 DIAGNOSIS — Z9889 Other specified postprocedural states: Secondary | ICD-10-CM | POA: Diagnosis not present

## 2019-07-20 DIAGNOSIS — E039 Hypothyroidism, unspecified: Secondary | ICD-10-CM | POA: Diagnosis present

## 2019-07-20 DIAGNOSIS — N179 Acute kidney failure, unspecified: Secondary | ICD-10-CM | POA: Diagnosis present

## 2019-07-20 DIAGNOSIS — R29810 Facial weakness: Secondary | ICD-10-CM | POA: Diagnosis present

## 2019-07-20 HISTORY — PX: IR PERCUTANEOUS ART THROMBECTOMY/INFUSION INTRACRANIAL INC DIAG ANGIO: IMG6087

## 2019-07-20 HISTORY — PX: RADIOLOGY WITH ANESTHESIA: SHX6223

## 2019-07-20 HISTORY — PX: IR CT HEAD LTD: IMG2386

## 2019-07-20 LAB — CBC
HCT: 42.3 % (ref 36.0–46.0)
Hemoglobin: 13.7 g/dL (ref 12.0–15.0)
MCH: 29.7 pg (ref 26.0–34.0)
MCHC: 32.4 g/dL (ref 30.0–36.0)
MCV: 91.6 fL (ref 80.0–100.0)
Platelets: 264 10*3/uL (ref 150–400)
RBC: 4.62 MIL/uL (ref 3.87–5.11)
RDW: 13.8 % (ref 11.5–15.5)
WBC: 9.2 10*3/uL (ref 4.0–10.5)
nRBC: 0 % (ref 0.0–0.2)

## 2019-07-20 LAB — BASIC METABOLIC PANEL
Anion gap: 7 (ref 5–15)
Anion gap: 10 (ref 5–15)
BUN: 14 mg/dL (ref 8–23)
BUN: 11 mg/dL (ref 8–23)
CO2: 20 mmol/L — ABNORMAL LOW (ref 22–32)
CO2: 17 mmol/L — ABNORMAL LOW (ref 22–32)
Calcium: 7.7 mg/dL — ABNORMAL LOW (ref 8.9–10.3)
Calcium: 8.3 mg/dL — ABNORMAL LOW (ref 8.9–10.3)
Chloride: 110 mmol/L (ref 98–111)
Chloride: 115 mmol/L — ABNORMAL HIGH (ref 98–111)
Creatinine, Ser: 0.82 mg/dL (ref 0.44–1.00)
Creatinine, Ser: 0.88 mg/dL (ref 0.44–1.00)
GFR calc Af Amer: >60 mL/min (ref >60)
GFR calc Af Amer: >60 mL/min (ref >60)
GFR calc non Af Amer: >60 mL/min (ref >60)
GFR calc non Af Amer: >60 mL/min (ref >60)
Glucose, Bld: 192 mg/dL — ABNORMAL HIGH (ref 70–99)
Glucose, Bld: 129 mg/dL — ABNORMAL HIGH (ref 70–99)
Potassium: 4 mmol/L (ref 3.5–5.1)
Potassium: 4.2 mmol/L (ref 3.5–5.1)
Sodium: 137 mmol/L (ref 135–145)
Sodium: 142 mmol/L (ref 135–145)

## 2019-07-20 LAB — POCT I-STAT 7, (LYTES, BLD GAS, ICA,H+H)
Acid-base deficit: 6 mmol/L — ABNORMAL HIGH (ref 0.0–2.0)
Acid-base deficit: 4 mmol/L — ABNORMAL HIGH (ref 0.0–2.0)
Bicarbonate: 20.2 mmol/L (ref 20.0–28.0)
Bicarbonate: 21.5 mmol/L (ref 20.0–28.0)
Calcium, Ion: 1.17 mmol/L (ref 1.15–1.40)
Calcium, Ion: 1.16 mmol/L (ref 1.15–1.40)
HCT: 42 % (ref 36.0–46.0)
HCT: 39 % (ref 36.0–46.0)
Hemoglobin: 14.3 g/dL (ref 12.0–15.0)
Hemoglobin: 13.3 g/dL (ref 12.0–15.0)
O2 Saturation: 93 %
O2 Saturation: 99 %
Patient temperature: 94.5
Patient temperature: 98.3
Potassium: 4 mmol/L (ref 3.5–5.1)
Potassium: 4 mmol/L (ref 3.5–5.1)
Sodium: 142 mmol/L (ref 135–145)
Sodium: 143 mmol/L (ref 135–145)
TCO2: 22 mmol/L (ref 22–32)
TCO2: 23 mmol/L (ref 22–32)
pCO2 arterial: 39.4 mmHg (ref 32.0–48.0)
pCO2 arterial: 38.2 mmHg (ref 32.0–48.0)
pH, Arterial: 7.306 — ABNORMAL LOW (ref 7.350–7.450)
pH, Arterial: 7.358 (ref 7.350–7.450)
pO2, Arterial: 66 mmHg — ABNORMAL LOW (ref 83.0–108.0)
pO2, Arterial: 133 mmHg — ABNORMAL HIGH (ref 83.0–108.0)

## 2019-07-20 LAB — GLUCOSE, CAPILLARY
Glucose-Capillary: 111 mg/dL — ABNORMAL HIGH (ref 70–99)
Glucose-Capillary: 117 mg/dL — ABNORMAL HIGH (ref 70–99)
Glucose-Capillary: 117 mg/dL — ABNORMAL HIGH (ref 70–99)
Glucose-Capillary: 118 mg/dL — ABNORMAL HIGH (ref 70–99)
Glucose-Capillary: 131 mg/dL — ABNORMAL HIGH (ref 70–99)
Glucose-Capillary: 169 mg/dL — ABNORMAL HIGH (ref 70–99)

## 2019-07-20 LAB — RESPIRATORY PANEL BY RT PCR (FLU A&B, COVID)
Influenza A by PCR: NEGATIVE
Influenza B by PCR: NEGATIVE
SARS Coronavirus 2 by RT PCR: NEGATIVE

## 2019-07-20 LAB — LIPID PANEL
Cholesterol: 113 mg/dL (ref 0–200)
HDL: 36 mg/dL — ABNORMAL LOW (ref >40)
LDL Cholesterol: 66 mg/dL (ref 0–99)
Total CHOL/HDL Ratio: 3.1 RATIO
Triglycerides: 57 mg/dL (ref <150)
VLDL: 11 mg/dL (ref 0–40)

## 2019-07-20 LAB — PHOSPHORUS: Phosphorus: 3.9 mg/dL (ref 2.5–4.6)

## 2019-07-20 LAB — ECHOCARDIOGRAM COMPLETE
Height: 62 in
Weight: 2426.82 oz

## 2019-07-20 LAB — HEMOGLOBIN A1C
Hgb A1c MFr Bld: 6.1 % — ABNORMAL HIGH (ref 4.8–5.6)
Hgb A1c MFr Bld: 6 % — ABNORMAL HIGH (ref 4.8–5.6)
Mean Plasma Glucose: 128.37 mg/dL
Mean Plasma Glucose: 125.5 mg/dL

## 2019-07-20 LAB — MAGNESIUM
Magnesium: 1.9 mg/dL (ref 1.7–2.4)
Magnesium: 3 mg/dL — ABNORMAL HIGH (ref 1.7–2.4)

## 2019-07-20 LAB — MRSA PCR SCREENING: MRSA by PCR: NEGATIVE

## 2019-07-20 LAB — TSH: TSH: 6.781 u[IU]/mL — ABNORMAL HIGH (ref 0.350–4.500)

## 2019-07-20 IMAGING — XA IR PERCUTANEOUS ART THORMBECTOMY/INFUSION INTRACRANIAL INCLUDE D
8 of 15 series · 8 of 24 positions shown · non-contrast
Comparison: CT/CT angiogram of the head and neck [DATE]
COMPARISON: CT/CT angiogram of the head and neck [DATE]
COMPARISON: CT/CT angiogram of the head and neck [DATE]

Addendum:
INDICATION: 73-year-old female with past medical history significant for
hypothyroidism presenting as a code stroke with right-sided
hemiplegia and new onset atrial fibrillation. Her last known well
was [DATE] on [DATE]. Head CT showed loss of gray-white
differentiation in the left frontal for colon extending to the
anterior aspect of the insula with no hemorrhage. CT angiogram
showed a left M2/MCA/superior division branch occlusion. NIHSS 25.

EXAM:
Diagnostic cerebral angiogram
Mechanical thrombectomy
Flat panel head CT
TECHNIQUE: Informed written consent was obtained from the patient's son after a
thorough discussion of the procedural risks, benefits and
alternatives. All questions were addressed. Maximal Sterile Barrier
Technique was utilized including caps, mask, sterile gowns, sterile
gloves, sterile drape, hand hygiene and skin antiseptic. A timeout
was performed prior to the initiation of the procedure.
differentiation in the left frontal lobe extending to the anterior
aspect of the insula with no hemorrhage. CT angiogram showed a left
M2/MCA/superior division branch occlusion. NIHSS 25.

[Series 1: cerebral · 2 acquisitions, 1 frame shown (1 of 6)]
[im 1/2]
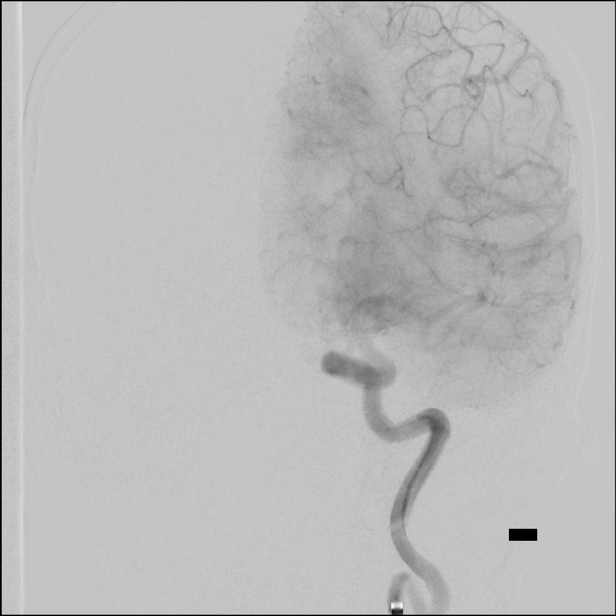

[Series 3: cerebral · 2 acquisitions, 1 frame shown (2 of 6)]
[im 1/2]
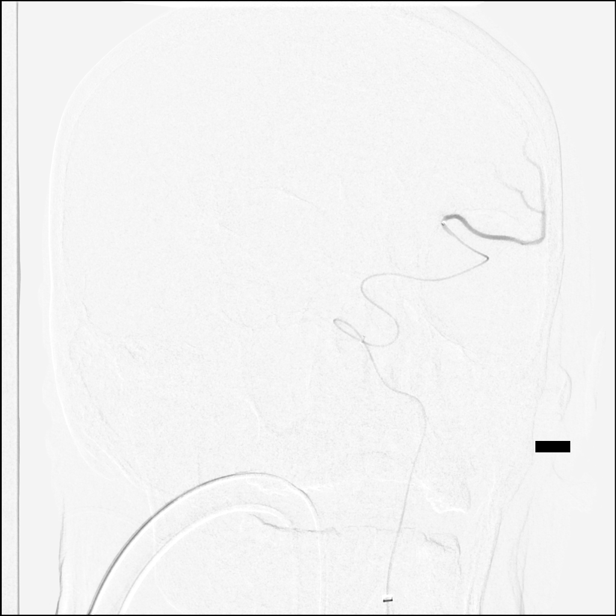

[Series 6: cerebral · 2 acquisitions, 1 frame shown (3 of 6)]
[im 1/2]
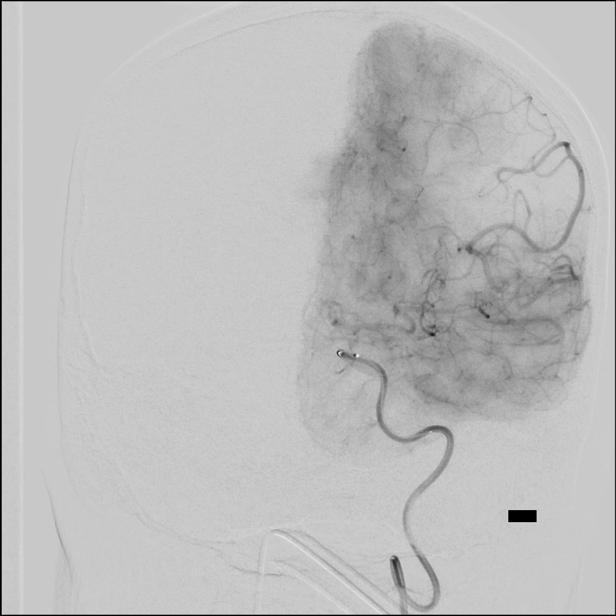

[Series 10: cerebral · 2 acquisitions, 1 frame shown (4 of 6)]
[im 1/2]
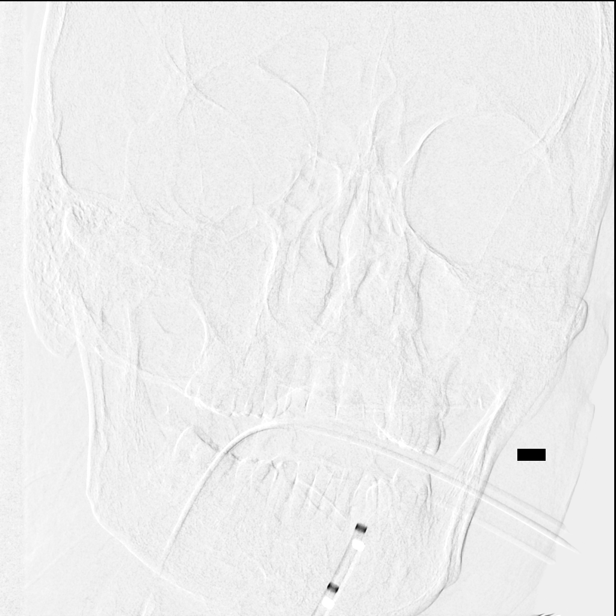

[Series 12: cerebral · 2 acquisitions, 1 frame shown (5 of 6)]
[im 1/2]
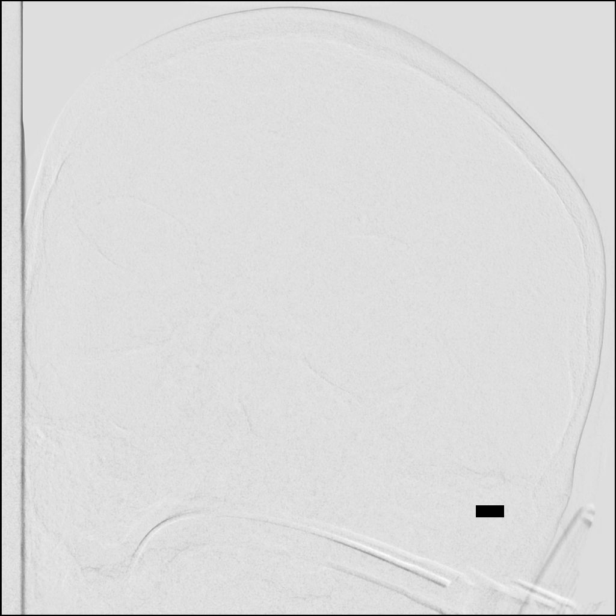

[Series 14: cerebral · 2 acquisitions, 1 frame shown (6 of 6)]
[im 1/2]
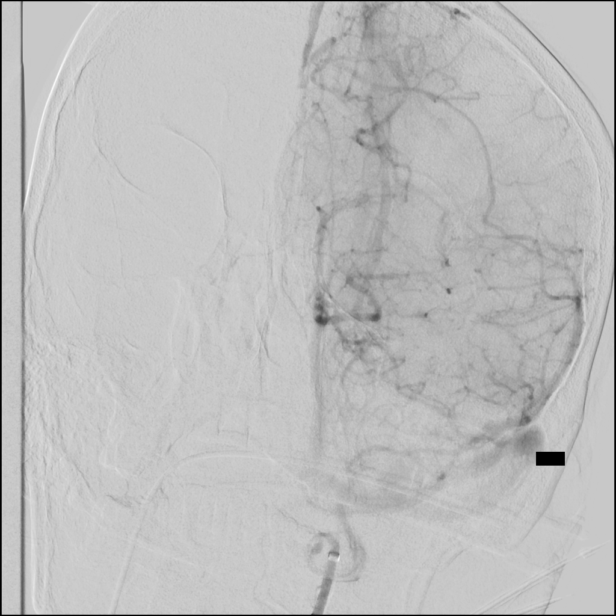

[Series 16: fl neuro · 2 acquisitions, 1 frame shown]
[im 1/2]
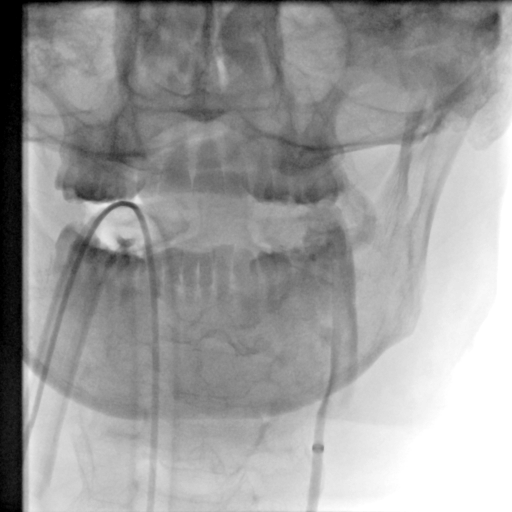

[Series 300: dr. (person_name) · 1 of 36 slices shown]
[im 1/36]
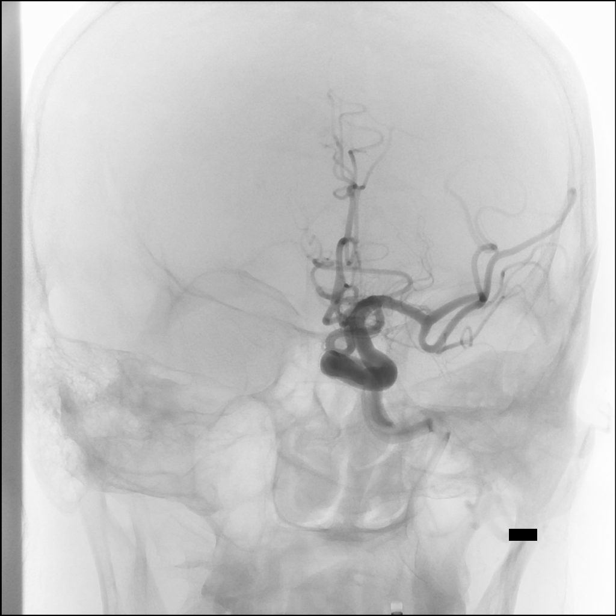

[8 of 24 positions shown; findings below may reference images not displayed]

MEDICATIONS:
5 mg of problem you have intra arterial to the left ICA.

ANESTHESIA/SEDATION:
General anesthesia

CONTRAST:  75 mL

FLUOROSCOPY TIME:  Fluoroscopy Time: 55 minutes 18 seconds (1,796
mGy).

COMPLICATIONS:
Moderate OSMANY subarachnoid hemorrhage
Using a micropuncture kit and modified Seldinger technique, access
was gained to the right common femoral artery and an 8 French sheath
was placed in the right common femoral artery.

Under fluoroscopy, an 8 French Walrus balloon guide catheter was
navigated over a 6 range OSMANY 2 catheter and a 0.035 inch
Terumo Glidewire into the aortic arch. Under fluoroscopy, the
catheter was advanced into the cervical right internal carotid
artery. The wire and 6 French catheter were removed and angiograms
of the head were obtained in frontal and lateral views.
FINDINGS: There is a left M2/MCA superior division branch occlusion.

Prominent tortuosity of the cervical left ICA and left carotid
siphon.

PROCEDURE:
A large bore catheter aspiration catheter was navigated through the
balloon guide catheter and over a phenom 21 microcatheter and a
[UY] support microguidewire into the cavernous segment of the
left ICA. The microcatheter was then advanced into the left M2/MCA
superior division branch, distal to the point of occlusion. Frontal
and lateral angiograms were obtained with microcatheter contrast
injection. A 4 x 40 mm solitaire stent retriever was subsequently
deployed spanning the distal left M1-mid M2. The device was allowed
to intercalated with the clot for 4 minutes. The microcatheter was
removed. The guiding catheter balloon was inflated and constant
aspiration was performed, the aspiration catheter was navigated near
the occlusion and connected to a penumbra aspiration pump. The stent
retriever was then retracted along with the aspiration catheter.
Frontal and lateral angiograms were obtained showing persistent left
M2/MCA occlusion.

In a similar fashion and using the same platform, a second stent
retriever pass was performed with the solitaire device. Frontal and
lateral angiograms were obtained showing persistent left M2/MCA
occlusion with the clot migrating proximally.

A catalyst 6 aspiration catheter was navigated through the balloon
guide catheter and over a phenom 21 microcatheter and posterior
0.014 inch Aristotle microguidewire into the cavernous segment of
the left ICA. The microcatheter was then advanced into the left
M2/MCA superior division branch, distal to the point of occlusion.
Frontal and lateral angiograms were obtained with microcatheter
contrast injection. A 4 x 41 mm trevo stent retriever was
subsequently deployed spanning the distal left M1-mid M2. The device
was allowed to intercalated with the clot for 4 minutes. The
microcatheter was removed. The guiding catheter balloon was inflated
and constant aspiration was performed, the aspiration catheter was
navigated near the occlusion and connected to a penumbra aspiration
pump. The stent retriever was then retracted along with the
aspiration catheter. Frontal and lateral angiograms were obtained
showing distal contrast penetration into the left M2/MCA superior
division branch with severe vasospasm. Intra arterial infusion of 5
mg of verapamil was performed over 10 minutes. Follow-up angiogram
showed reocclusion of the left M2/MCA superior division branch.

A 3 max aspiration catheter was navigated through the balloon guide
catheter and over the Aristotle microguidewire into the left and
2/MCA superior division branch at the level of occlusion. Continuous
is aspiration was performed for 4 minutes. The guiding catheter
balloon was inflated and the aspiration catheter was then removed.
Follow-up left ICA angiograms with frontal and lateral views showed
recanalization of the left M2/MCA superior division branch with slow
flow in the distal angular branch (TICI 2C).

A flat panel CT of the head was then obtained. Moderate OSMANY
subarachnoid hemorrhage was identified.

The catheter was then removed.

A right common femoral artery angiogram was obtained via sheath side
port.

A 6 French Perclose ProGlide was utilized for right, femoral access
closure. Immediate hemostasis was achieved.

Patient was transferred to ICU for continued monitoring.

Family and neurology team communicated immediately after the end of
the procedure.
IMPRESSION: 1. Left M2/MCA superior division branch occlusion.
2. Successful mechanical thrombectomy/aspiration performed with TICI
2C final recanalization. A total of 3 stent retriever passes and 1
aspiration pass performed.
3. Moderate size OSMANY subarachnoid hemorrhage seen on
postprocedure flat panel CT.

PLAN:
Continued monitoring in ICU level of care.

Repeat head CT within 4 hours to evaluate for subarachnoid
hemorrhage stability.

ADDENDUM:
Addendum placed due to speech recognition errors in the below
sections:
MEDICATIONS:
5 mg of Verapamil intra arterial to the left ICA.

*** End of Addendum ***
MEDICATIONS:
5 mg of problem you have intra arterial to the left ICA.

ANESTHESIA/SEDATION:
General anesthesia

CONTRAST:  75 mL

FLUOROSCOPY TIME:  Fluoroscopy Time: 55 minutes 18 seconds (1,796
mGy).

COMPLICATIONS:
Moderate OSMANY subarachnoid hemorrhage
Using a micropuncture kit and modified Seldinger technique, access
was gained to the right common femoral artery and an 8 French sheath
was placed in the right common femoral artery.

Under fluoroscopy, an 8 French Walrus balloon guide catheter was
navigated over a 6 range OSMANY 2 catheter and a 0.035 inch
Terumo Glidewire into the aortic arch. Under fluoroscopy, the
catheter was advanced into the cervical right internal carotid
artery. The wire and 6 French catheter were removed and angiograms
of the head were obtained in frontal and lateral views.
FINDINGS: There is a left M2/MCA superior division branch occlusion.

Prominent tortuosity of the cervical left ICA and left carotid
siphon.

PROCEDURE:
A large bore catheter aspiration catheter was navigated through the
balloon guide catheter and over a phenom 21 microcatheter and a
[UY] support microguidewire into the cavernous segment of the
left ICA. The microcatheter was then advanced into the left M2/MCA
superior division branch, distal to the point of occlusion. Frontal
and lateral angiograms were obtained with microcatheter contrast
injection. A 4 x 40 mm solitaire stent retriever was subsequently
deployed spanning the distal left M1-mid M2. The device was allowed
to intercalated with the clot for 4 minutes. The microcatheter was
removed. The guiding catheter balloon was inflated and constant
aspiration was performed, the aspiration catheter was navigated near
the occlusion and connected to a penumbra aspiration pump. The stent
retriever was then retracted along with the aspiration catheter.
Frontal and lateral angiograms were obtained showing persistent left
M2/MCA occlusion.

In a similar fashion and using the same platform, a second stent
retriever pass was performed with the solitaire device. Frontal and
lateral angiograms were obtained showing persistent left M2/MCA
occlusion with the clot migrating proximally.

A catalyst 6 aspiration catheter was navigated through the balloon
guide catheter and over a phenom 21 microcatheter and posterior
0.014 inch Aristotle microguidewire into the cavernous segment of
the left ICA. The microcatheter was then advanced into the left
M2/MCA superior division branch, distal to the point of occlusion.
Frontal and lateral angiograms were obtained with microcatheter
contrast injection. A 4 x 41 mm trevo stent retriever was
subsequently deployed spanning the distal left M1-mid M2. The device
was allowed to intercalated with the clot for 4 minutes. The
microcatheter was removed. The guiding catheter balloon was inflated
and constant aspiration was performed, the aspiration catheter was
navigated near the occlusion and connected to a penumbra aspiration
pump. The stent retriever was then retracted along with the
aspiration catheter. Frontal and lateral angiograms were obtained
showing distal contrast penetration into the left M2/MCA superior
division branch with severe vasospasm. Intra arterial infusion of 5
mg of verapamil was performed over 10 minutes. Follow-up angiogram
showed reocclusion of the left M2/MCA superior division branch.

A 3 max aspiration catheter was navigated through the balloon guide
catheter and over the Aristotle microguidewire into the left and
2/MCA superior division branch at the level of occlusion. Continuous
is aspiration was performed for 4 minutes. The guiding catheter
balloon was inflated and the aspiration catheter was then removed.
Follow-up left ICA angiograms with frontal and lateral views showed
recanalization of the left M2/MCA superior division branch with slow
flow in the distal angular branch (TICI 2C).

A flat panel CT of the head was then obtained. Moderate OSMANY
subarachnoid hemorrhage was identified.

The catheter was then removed.

A right common femoral artery angiogram was obtained via sheath side
port.

A 6 French Perclose ProGlide was utilized for right, femoral access
closure. Immediate hemostasis was achieved.

Patient was transferred to ICU for continued monitoring.

Family and neurology team communicated immediately after the end of
the procedure.
IMPRESSION: 1. Left M2/MCA superior division branch occlusion.
2. Successful mechanical thrombectomy/aspiration performed with TICI
2C final recanalization. A total of 3 stent retriever passes and 1
aspiration pass performed.
3. Moderate size OSMANY subarachnoid hemorrhage seen on
postprocedure flat panel CT.

PLAN:
Continued monitoring in ICU level of care.

Repeat head CT within 4 hours to evaluate for subarachnoid
hemorrhage stability.

## 2019-07-20 IMAGING — CT CT HEAD W/O CM
3 series · 14 of 47 positions shown, 16 images · non-contrast
Comparison: Postprocedure head CT [DATE], noncontrast head CT
and CT angiogram head/neck [DATE]

CLINICAL DATA: Stroke, follow-up.

EXAM:
CT HEAD WITHOUT CONTRAST
TECHNIQUE: Contiguous axial images were obtained from the base of the skull
through the vertex without intravenous contrast.

[Series 3: head 5.0 h30s · axial · 0.48mm/px · z∈[+152,+277]mm · 8 of 30 slices shown, 10 images]
[im 3/30  brain]
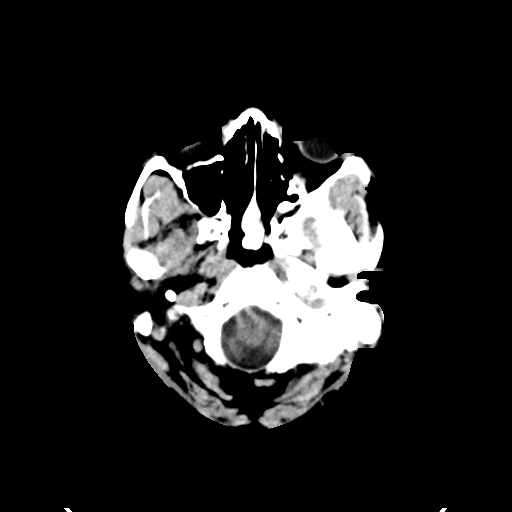
[im 3/30  bone]
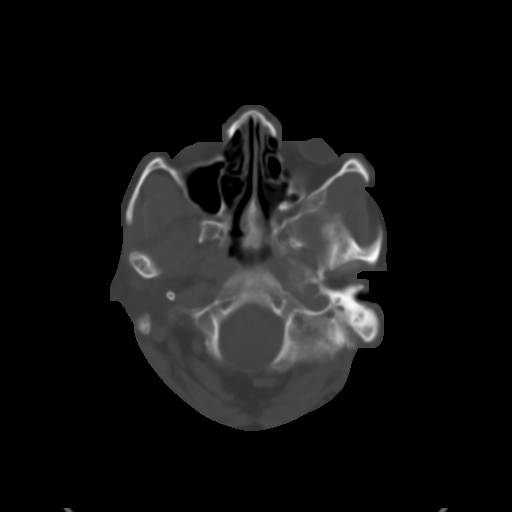
[im 7/30  brain]
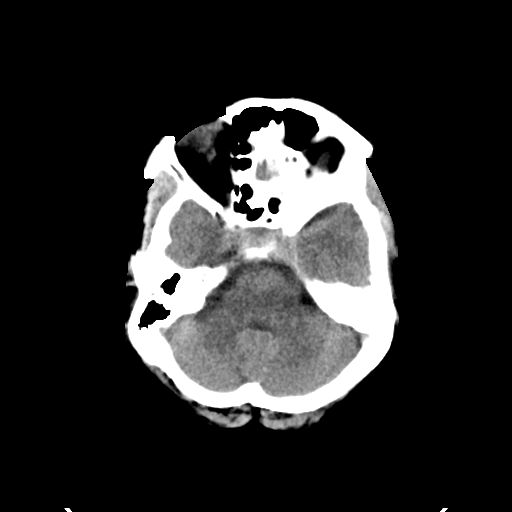
[im 10/30  brain]
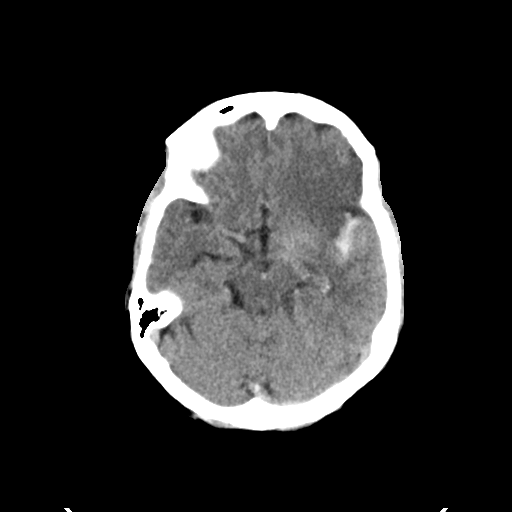
[im 14/30  brain]
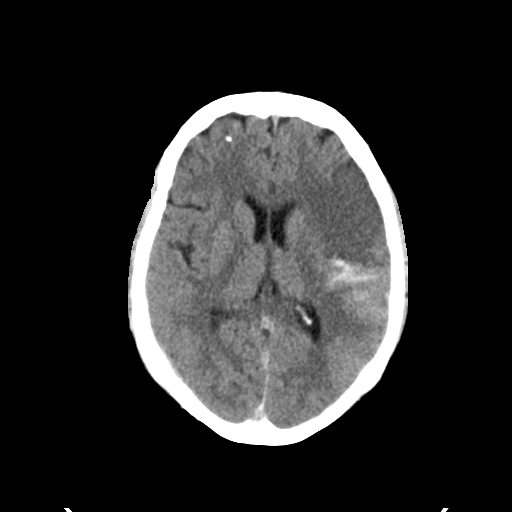
[im 17/30  brain]
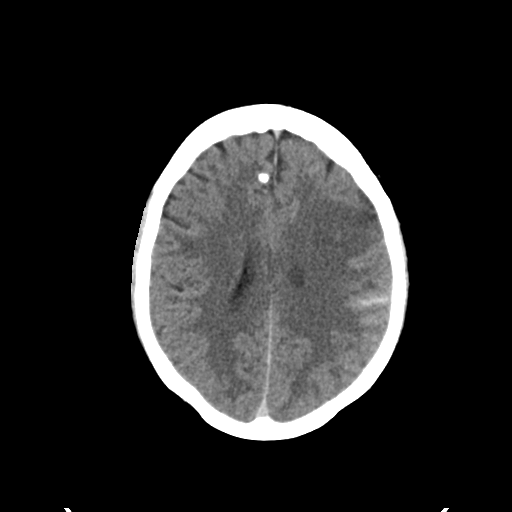
[im 17/30  bone]
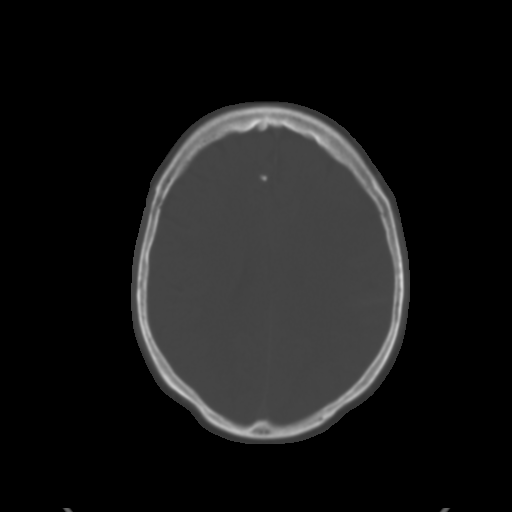
[im 21/30  brain]
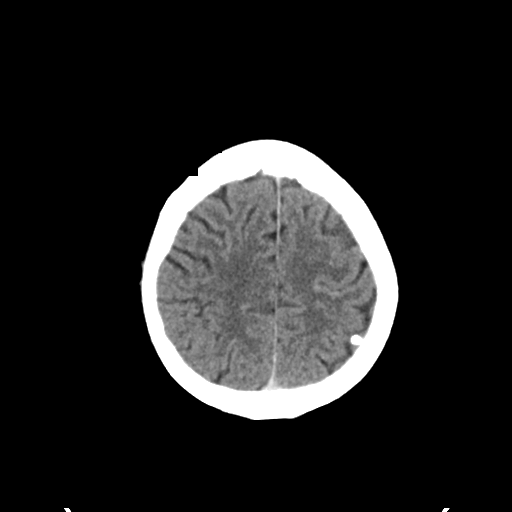
[im 24/30  brain]
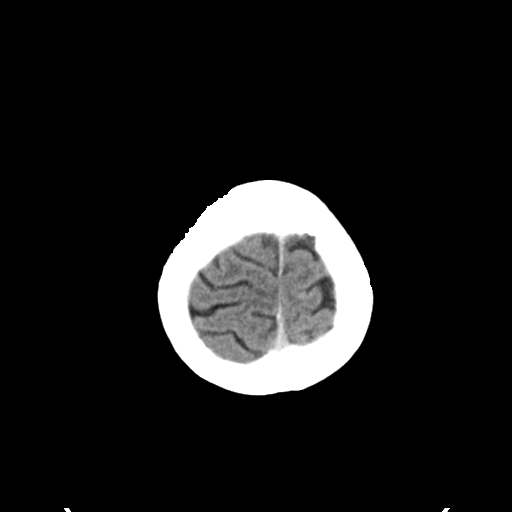
[im 28/30  brain]
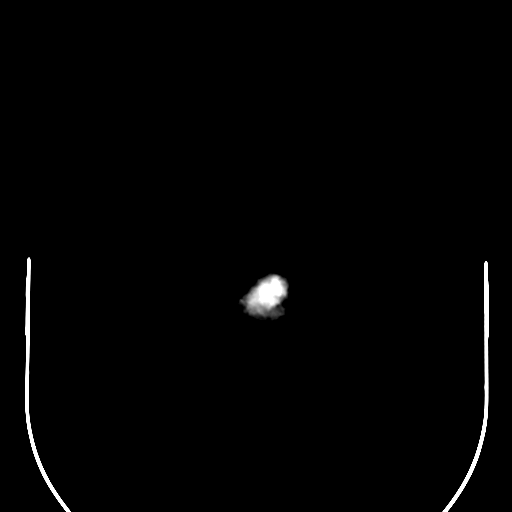

[Series 5: head 3.0 mpr cor · coronal · 0.29mm/px · 3 of 67 slices shown]
[im 23/67  brain]
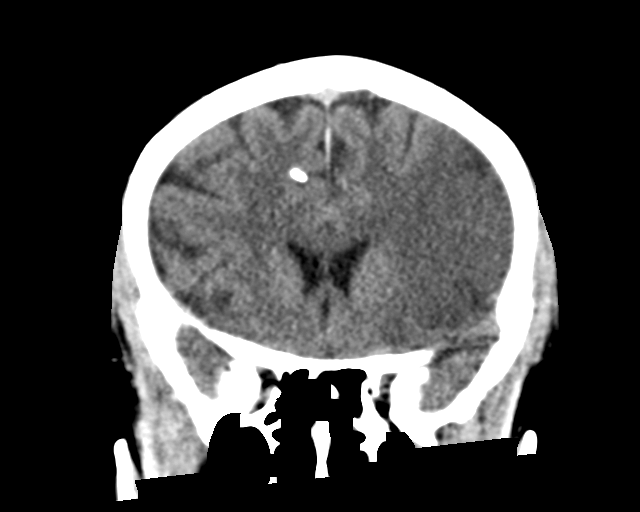
[im 30/67  brain]
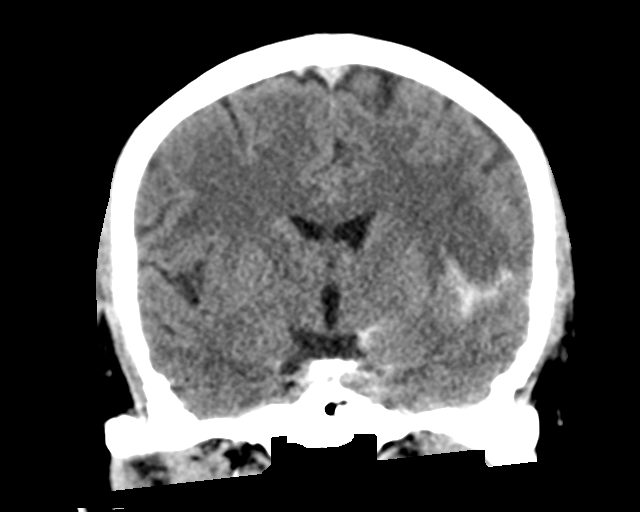
[im 37/67  brain]
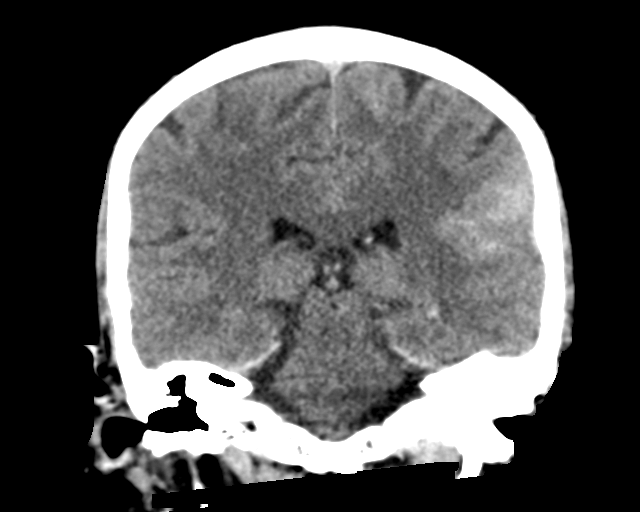

[Series 6: head 3.0 mpr sag · sagittal · 0.29mm/px · 3 of 67 slices shown]
[im 24/67  brain]
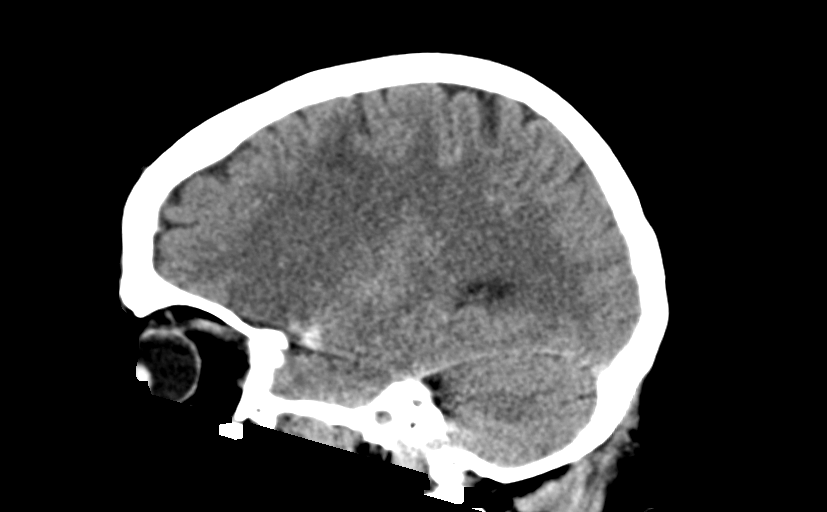
[im 34/67  brain]
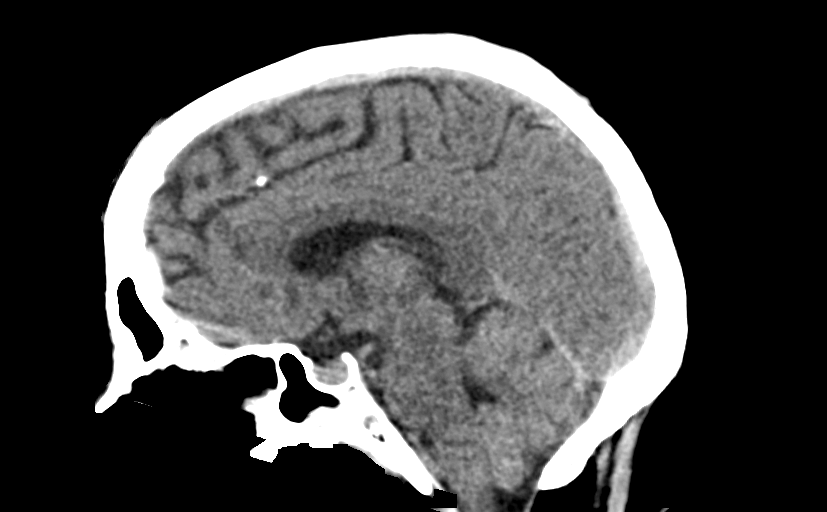
[im 43/67  brain]
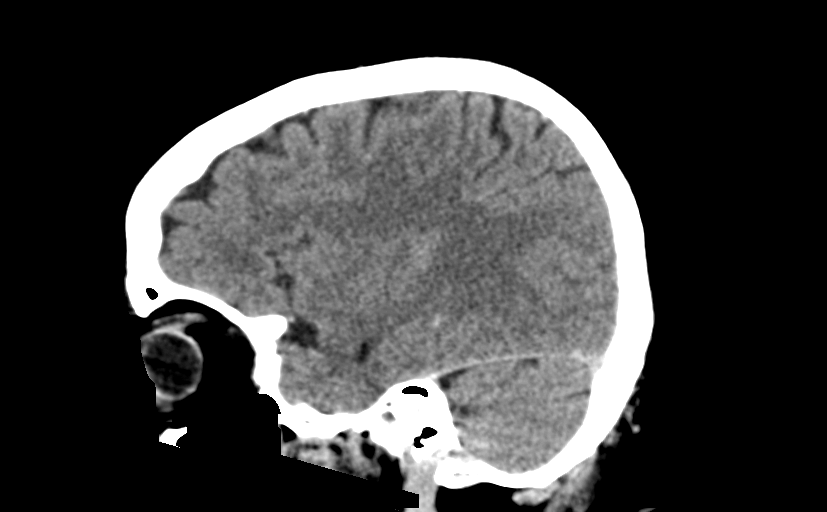

[14 of 47 positions shown; findings below may reference images not displayed]

FINDINGS: Brain:

Mildly motion degraded examination. There is residual circulating
contrast. There has been continued further demarcation of an acute
ischemic infarct centered within the anterolateral left frontal lobe
with involvement of the left frontal operculum as well as left
insula and subinsular region. There may be subtle petechial
hemorrhage in the infarction territory. There is no significant mass
effect at this time. No midline shift. Small to moderate volume
acute subarachnoid hemorrhage within the left sylvian fissure and
extending to the basal cisterns does not appear significantly
changed as compared postprocedural head CT [DATE]. No evidence
of hydrocephalus.

Vascular: Circulating contrast material limits evaluation for
abnormal vascular hyperdensity.

Skull: Normal. Negative for fracture or focal lesion.

Sinuses/Orbits: Visualized orbits demonstrate no acute abnormality.
Mild right maxillary sinus mucosal thickening. Redemonstrated left
middle ear/mastoid effusion.
IMPRESSION: Continued further demarcation of an acute ischemic infarct centered
within the anterolateral left frontal lobe with involvement of the
left frontal operculum as well as left insula and subinsular region.
There may be mild petechial hemorrhage in the infarction territory.
No significant mass effect at this time.

Small to moderate volume acute subarachnoid hemorrhage within the
left sylvian fissure and extending to the basal cisterns, not
significantly changed from postprocedural head CT [DATE]. No
evidence of hydrocephalus.

## 2019-07-20 IMAGING — CT CT HEAD W/O CM
4 series · 16 of 47 positions shown, 18 images · non-contrast
Comparison: Head CT earlier same day.

CLINICAL DATA: Follow-up stroke

EXAM:
CT HEAD WITHOUT CONTRAST
TECHNIQUE: Contiguous axial images were obtained from the base of the skull
through the vertex without intravenous contrast.

[Series 3: head without · axial · non-contrast · 0.39mm/px · z∈[-112,+8]mm · 7 of 32 slices shown, 9 images]
[im 4/32  brain]
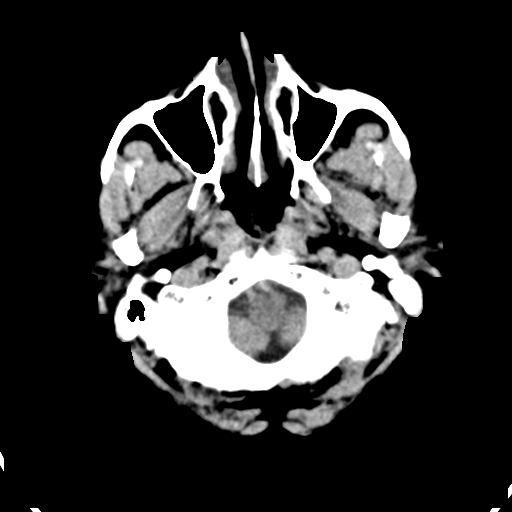
[im 4/32  bone]
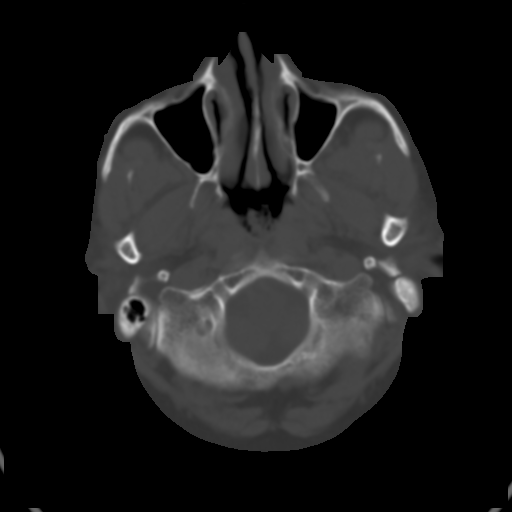
[im 8/32  brain]
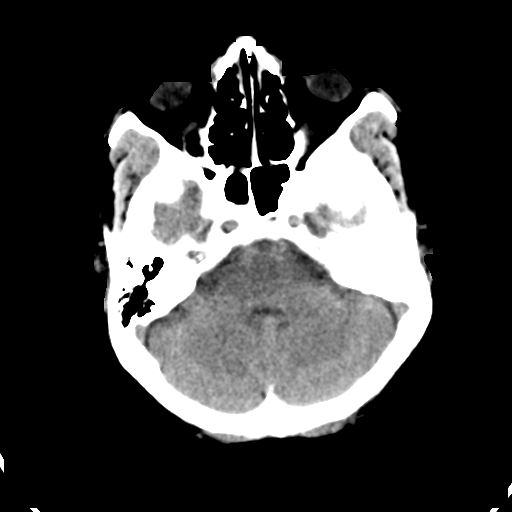
[im 12/32  brain]
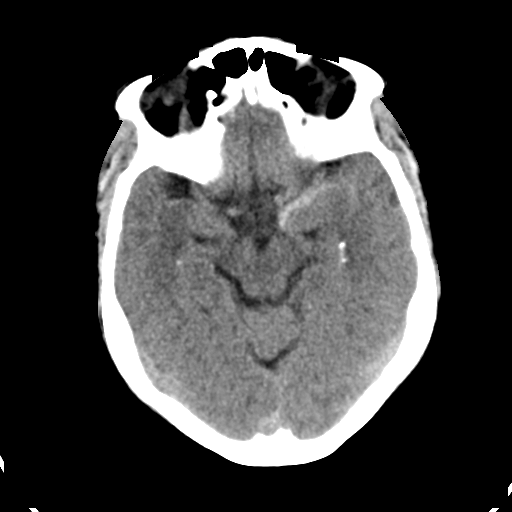
[im 16/32  brain]
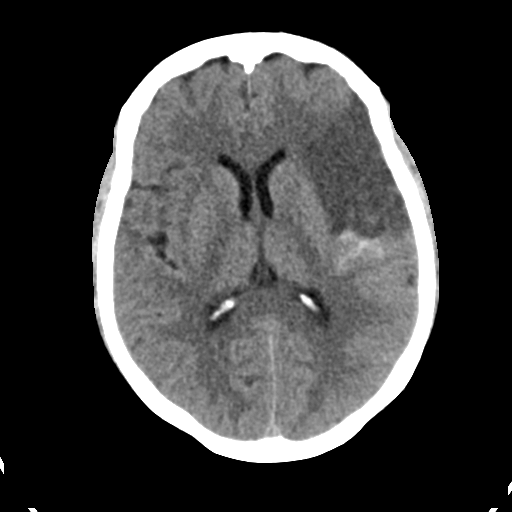
[im 20/32  brain]
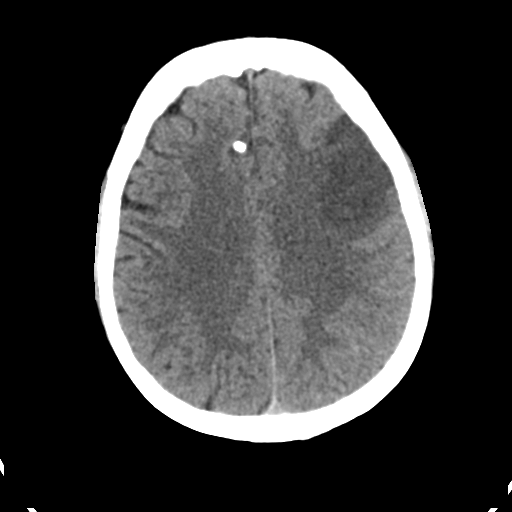
[im 20/32  bone]
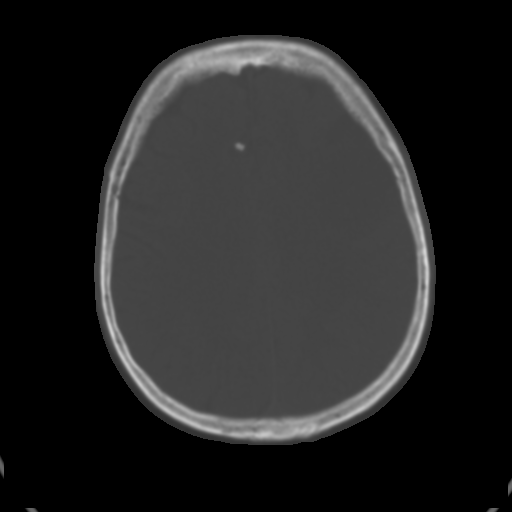
[im 24/32  brain]
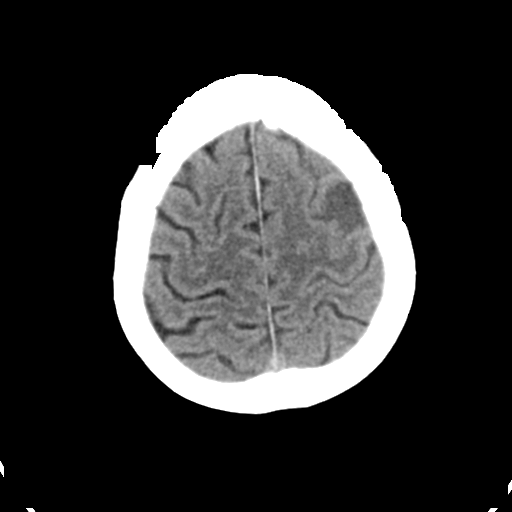
[im 28/32  brain]
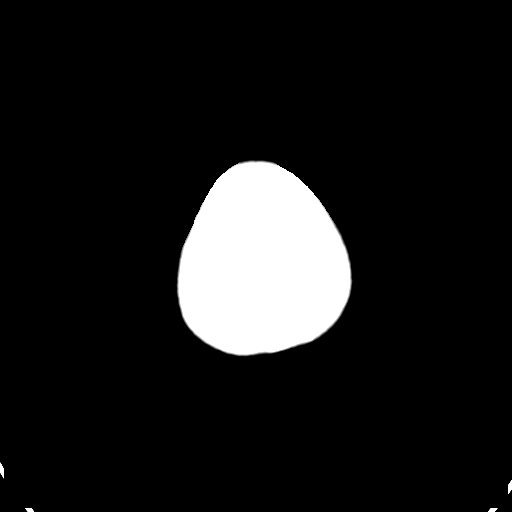

[Series 4: head bone · axial · 0.39mm/px · z∈[-113,-81]mm · 3 of 78 slices shown]
[im 8/78  bone]
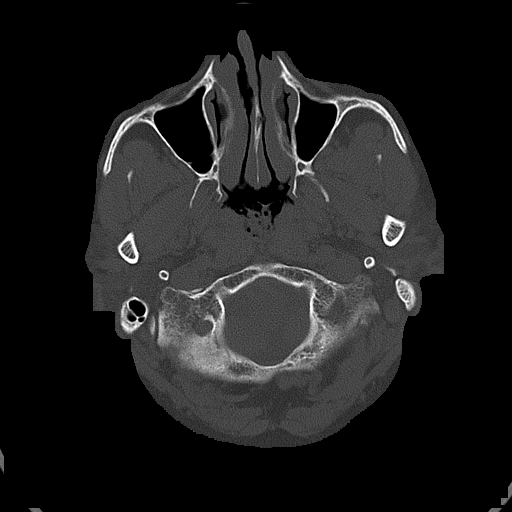
[im 16/78  bone]
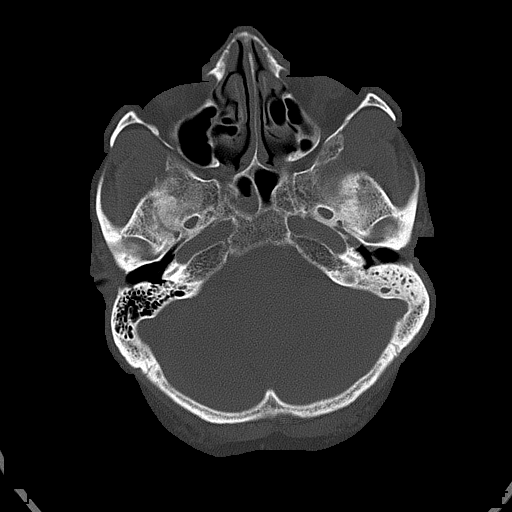
[im 24/78  bone]
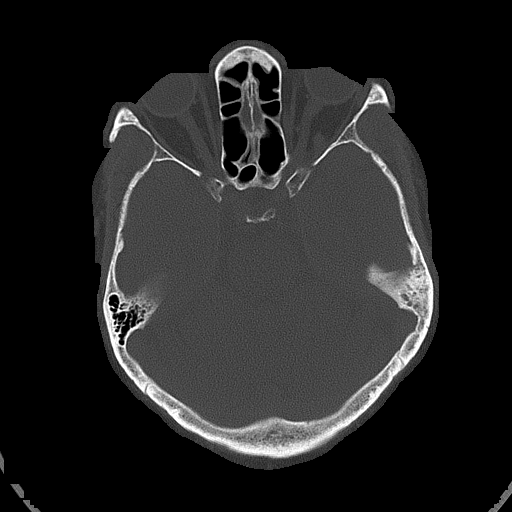

[Series 5: head without cor · coronal · non-contrast · 0.30mm/px · 3 of 67 slices shown]
[im 23/67  brain]
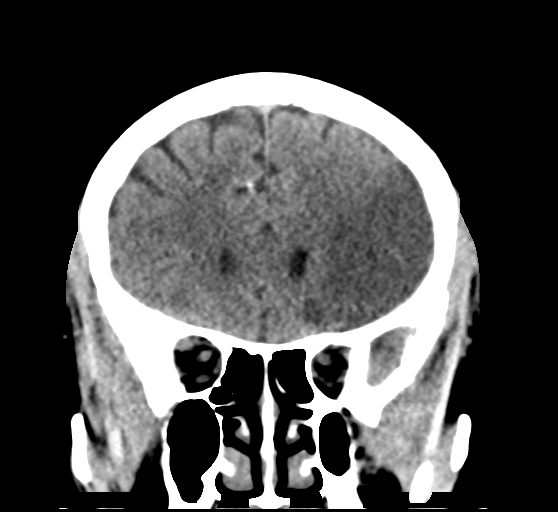
[im 30/67  brain]
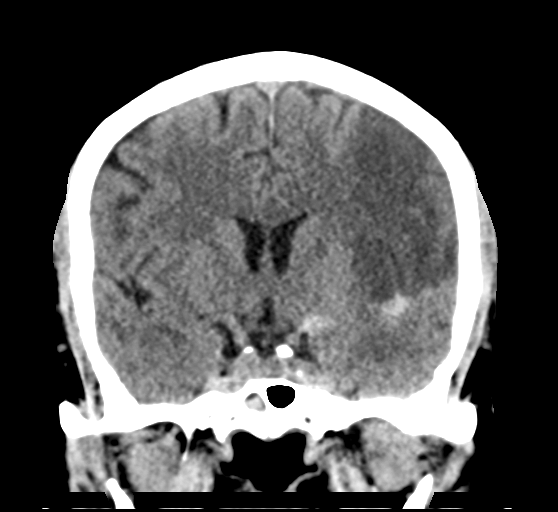
[im 37/67  brain]
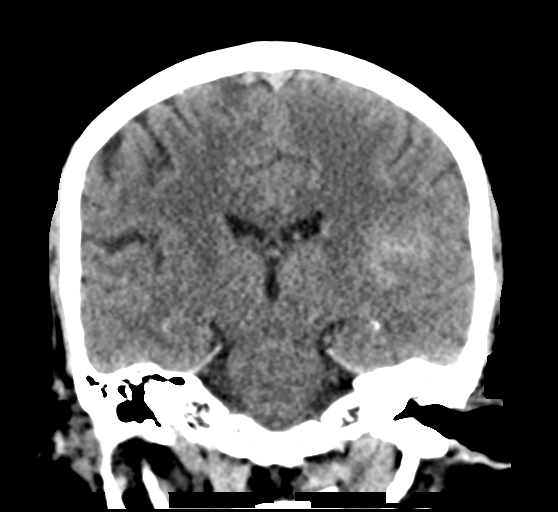

[Series 6: head without sag · sagittal · non-contrast · 0.30mm/px · 3 of 52 slices shown]
[im 18/52  brain]
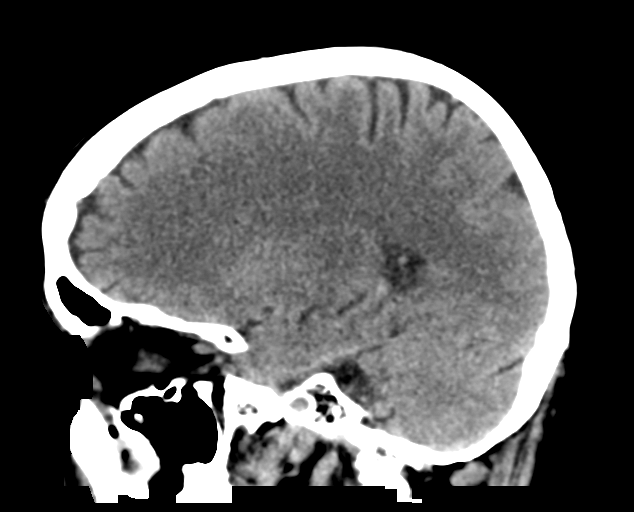
[im 26/52  brain]
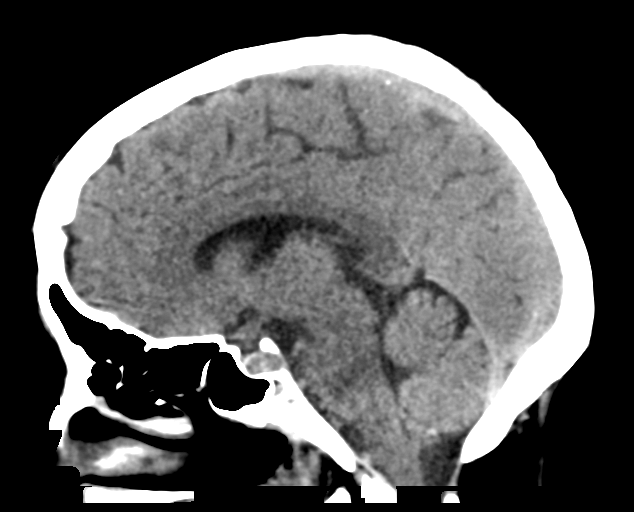
[im 35/52  brain]
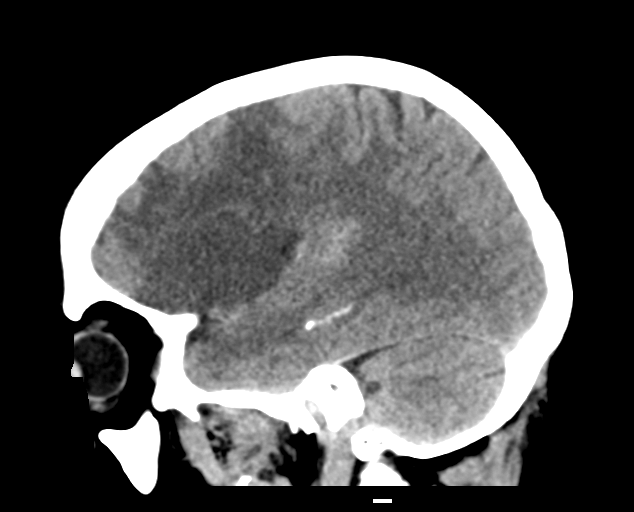

[16 of 47 positions shown; findings below may reference images not displayed]

FINDINGS: Brain: Acute infarction of the left frontal operculum, anterior
insular and subinsular brain as shown previously. Low-density and
swelling but no evidence of further extension of the infarction.
Small amount of subarachnoid hemorrhage at the base of the brain and
within the sylvian fissure on the left is becoming slightly less
dense. No evidence of additional or worsening hemorrhage. No
significant mass effect. One or 2 mm of left-to-right midline shift
at the most. No other area of acute or subacute infarction. Chronic
brain parenchymal calcifications consistent with old
neurocysticercosis as seen previously. No hydrocephalus. No
extra-axial collection. No intraparenchymal hemorrhage in the region
of infarction.

Vascular: No new primary vascular finding.

Skull: Negative

Sinuses/Orbits: Clear/normal

Other: None
IMPRESSION: Subarachnoid hemorrhage at the base of the brain and sylvian fissure
on the left is becoming less dense. No evidence of additional
bleeding.

Well-circumscribed infarction in the left frontal operculum, insula
and subinsular brain as seen previously. No evidence of infarct
extension. Mild regional swelling with left-to-right shift of 1-2
mm. No infarct hematoma.

## 2019-07-20 IMAGING — DX DG CHEST 1V
1 series · 1 of 1 positions shown · non-contrast
Comparison: None.

CLINICAL DATA: Intubation

EXAM:
CHEST  1 VIEW

[chest ap]
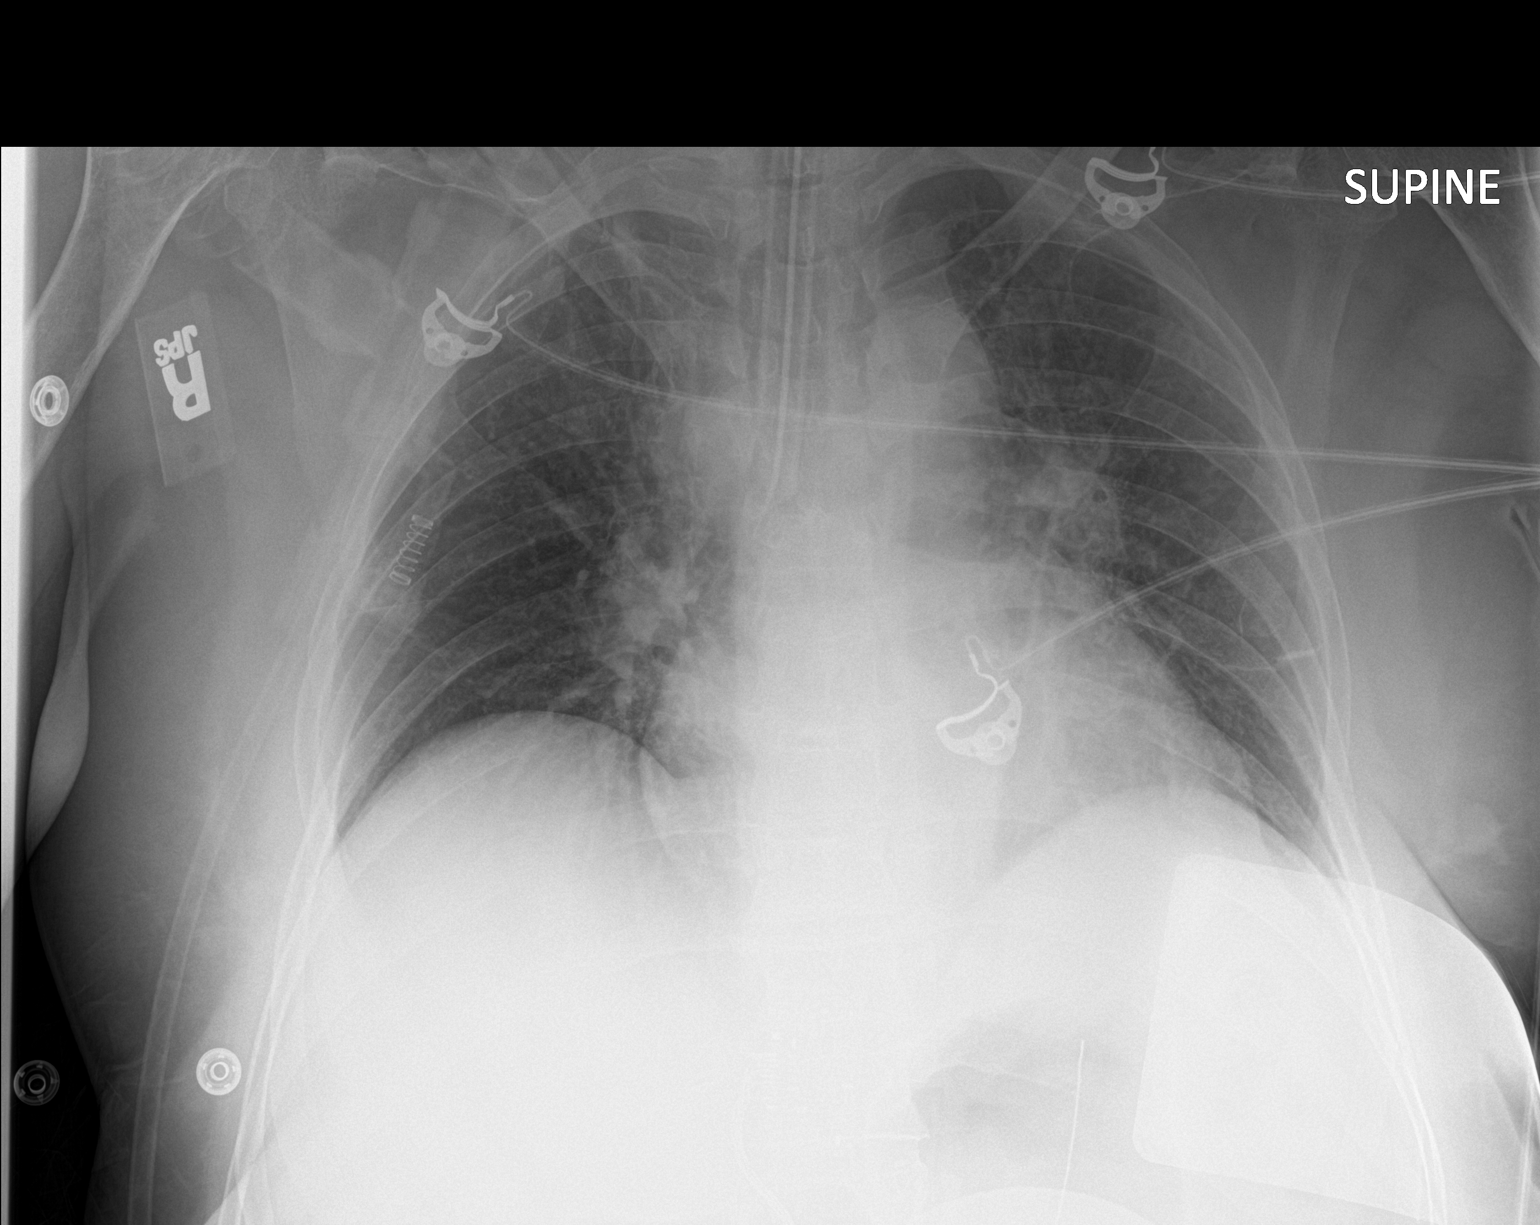

[1 of 1 positions shown; findings below may reference images not displayed]

FINDINGS: Endotracheal tube tip is positioned within the proximal right
mainstem bronchus. Heart size is mildly enlarged. No focal airspace
consolidation. No pleural effusion or pneumothorax.
IMPRESSION: 1. Endotracheal tube tip within the proximal right mainstem
bronchus. Recommend retraction approximately 3.0 cm.
2. Mild cardiomegaly.

These results will be called to the ordering clinician or
representative by the Radiologist Assistant, and communication
documented in the PACS or zVision Dashboard.

## 2019-07-20 SURGERY — IR WITH ANESTHESIA
Anesthesia: General

## 2019-07-20 MED ORDER — CLEVIDIPINE BUTYRATE 0.5 MG/ML IV EMUL: 0.0000 mg/h | INTRAVENOUS | Status: DC

## 2019-07-20 MED ORDER — ASPIRIN 81 MG PO CHEW
CHEWABLE_TABLET | ORAL | Status: AC
  Filled 2019-07-20: qty 1

## 2019-07-20 MED ORDER — TRAMADOL HCL 50 MG PO TABS: 50.0000 mg | ORAL_TABLET | Freq: Two times a day (BID) | ORAL | Status: DC | PRN

## 2019-07-20 MED ORDER — SODIUM CHLORIDE 0.9 % IV SOLN: INTRAVENOUS | Status: DC | PRN

## 2019-07-20 MED ORDER — PROPOFOL 10 MG/ML IV BOLUS
INTRAVENOUS | Status: DC | PRN
  Administered 2019-07-20: 100 mg via INTRAVENOUS

## 2019-07-20 MED ORDER — SENNOSIDES-DOCUSATE SODIUM 8.6-50 MG PO TABS: 1.0000 | ORAL_TABLET | Freq: Every evening | ORAL | Status: DC | PRN

## 2019-07-20 MED ORDER — ATORVASTATIN CALCIUM 40 MG PO TABS
40.0000 mg | ORAL_TABLET | Freq: Every day | ORAL | Status: DC
  Filled 2019-07-20: qty 1

## 2019-07-20 MED ORDER — MAGNESIUM SULFATE 2 GM/50ML IV SOLN
2.0000 g | Freq: Once | INTRAVENOUS | Status: AC
  Administered 2019-07-20: 2 g via INTRAVENOUS
  Filled 2019-07-20: qty 50

## 2019-07-20 MED ORDER — CHLORHEXIDINE GLUCONATE CLOTH 2 % EX PADS
6.0000 | MEDICATED_PAD | Freq: Every day | CUTANEOUS | Status: DC
  Administered 2019-07-21: 6 via TOPICAL

## 2019-07-20 MED ORDER — ACETAMINOPHEN 160 MG/5ML PO SOLN
650.0000 mg | ORAL | Status: DC | PRN
  Administered 2019-07-21 – 2019-07-23 (×3): 650 mg
  Filled 2019-07-20 (×3): qty 20.3

## 2019-07-20 MED ORDER — GLYCOPYRROLATE 0.2 MG/ML IJ SOLN
INTRAMUSCULAR | Status: DC | PRN
  Administered 2019-07-20: .2 mg via INTRAVENOUS

## 2019-07-20 MED ORDER — ONDANSETRON HCL 4 MG/2ML IJ SOLN
4.0000 mg | Freq: Four times a day (QID) | INTRAMUSCULAR | Status: DC | PRN
  Administered 2019-07-20: 4 mg via INTRAVENOUS
  Filled 2019-07-20: qty 2

## 2019-07-20 MED ORDER — MIDAZOLAM HCL 2 MG/2ML IJ SOLN
1.0000 mg | INTRAMUSCULAR | Status: DC | PRN
  Administered 2019-07-20 (×2): 2 mg via INTRAVENOUS
  Filled 2019-07-20 (×2): qty 2

## 2019-07-20 MED ORDER — SODIUM CHLORIDE 0.9 % IV SOLN: INTRAVENOUS | Status: AC

## 2019-07-20 MED ORDER — PANTOPRAZOLE SODIUM 40 MG IV SOLR: 40.0000 mg | Freq: Every day | INTRAVENOUS | Status: DC

## 2019-07-20 MED ORDER — MIDAZOLAM HCL 2 MG/2ML IJ SOLN: 1.0000 mg | INTRAMUSCULAR | Status: DC | PRN

## 2019-07-20 MED ORDER — LABETALOL HCL 5 MG/ML IV SOLN: 20.0000 mg | Freq: Once | INTRAVENOUS | Status: DC

## 2019-07-20 MED ORDER — ACETAMINOPHEN 325 MG PO TABS
650.0000 mg | ORAL_TABLET | ORAL | Status: DC | PRN
  Administered 2019-07-23: 21:00:00 650 mg via ORAL
  Filled 2019-07-20: qty 2

## 2019-07-20 MED ORDER — TIROFIBAN HCL IN NACL 5-0.9 MG/100ML-% IV SOLN
INTRAVENOUS | Status: AC
  Filled 2019-07-20: qty 100

## 2019-07-20 MED ORDER — EPTIFIBATIDE 20 MG/10ML IV SOLN
INTRAVENOUS | Status: AC
  Filled 2019-07-20: qty 10

## 2019-07-20 MED ORDER — ATROPINE SULFATE 1 MG/10ML IJ SOSY
1.0000 mg | PREFILLED_SYRINGE | Freq: Once | INTRAMUSCULAR | Status: DC
  Filled 2019-07-20: qty 10

## 2019-07-20 MED ORDER — ACETAMINOPHEN 650 MG RE SUPP: 650.0000 mg | RECTAL | Status: DC | PRN

## 2019-07-20 MED ORDER — LEVOTHYROXINE SODIUM 75 MCG PO TABS: 75.0000 ug | ORAL_TABLET | Freq: Every day | ORAL | Status: DC

## 2019-07-20 MED ORDER — PHENYLEPHRINE HCL-NACL 10-0.9 MG/250ML-% IV SOLN
INTRAVENOUS | Status: DC | PRN
  Administered 2019-07-20: 50 ug/min via INTRAVENOUS

## 2019-07-20 MED ORDER — PANTOPRAZOLE SODIUM 40 MG IV SOLR
40.0000 mg | Freq: Two times a day (BID) | INTRAVENOUS | Status: DC
  Administered 2019-07-20: 40 mg via INTRAVENOUS
  Filled 2019-07-20: qty 40

## 2019-07-20 MED ORDER — SUCCINYLCHOLINE 20MG/ML (10ML) SYRINGE FOR MEDFUSION PUMP - OPTIME
INTRAMUSCULAR | Status: DC | PRN
  Administered 2019-07-20: 100 mg via INTRAVENOUS

## 2019-07-20 MED ORDER — VERAPAMIL HCL 2.5 MG/ML IV SOLN
INTRAVENOUS | Status: AC
  Filled 2019-07-20: qty 2

## 2019-07-20 MED ORDER — CLOPIDOGREL BISULFATE 300 MG PO TABS
ORAL_TABLET | ORAL | Status: AC
  Filled 2019-07-20: qty 1

## 2019-07-20 MED ORDER — FENTANYL CITRATE (PF) 100 MCG/2ML IJ SOLN
INTRAMUSCULAR | Status: DC | PRN
  Administered 2019-07-20: 100 ug via INTRAVENOUS

## 2019-07-20 MED ORDER — CHLORHEXIDINE GLUCONATE 0.12% ORAL RINSE (MEDLINE KIT)
15.0000 mL | Freq: Two times a day (BID) | OROMUCOSAL | Status: DC
  Administered 2019-07-20 – 2019-07-22 (×5): 15 mL via OROMUCOSAL

## 2019-07-20 MED ORDER — HYDROCHLOROTHIAZIDE 25 MG PO TABS: 25.0000 mg | ORAL_TABLET | Freq: Every day | ORAL | Status: DC

## 2019-07-20 MED ORDER — FENTANYL 2500MCG IN NS 250ML (10MCG/ML) PREMIX INFUSION
0.0000 ug/h | INTRAVENOUS | Status: DC
  Administered 2019-07-20: 22:00:00 150 ug/h via INTRAVENOUS
  Administered 2019-07-20: 04:00:00 50 ug/h via INTRAVENOUS
  Filled 2019-07-20 (×2): qty 250

## 2019-07-20 MED ORDER — FENTANYL CITRATE (PF) 100 MCG/2ML IJ SOLN
INTRAMUSCULAR | Status: AC
  Filled 2019-07-20: qty 2

## 2019-07-20 MED ORDER — CEFAZOLIN SODIUM-DEXTROSE 2-4 GM/100ML-% IV SOLN
INTRAVENOUS | Status: AC
  Filled 2019-07-20: qty 100

## 2019-07-20 MED ORDER — IPRATROPIUM-ALBUTEROL 0.5-2.5 (3) MG/3ML IN SOLN
3.0000 mL | RESPIRATORY_TRACT | Status: AC
  Administered 2019-07-20 (×2): 3 mL via RESPIRATORY_TRACT
  Filled 2019-07-20 (×2): qty 3

## 2019-07-20 MED ORDER — VERAPAMIL HCL 2.5 MG/ML IV SOLN
INTRAVENOUS | Status: AC
  Administered 2019-07-20: 02:00:00 5 mg via INTRA_ARTERIAL
  Filled 2019-07-20: qty 2

## 2019-07-20 MED ORDER — MAGNESIUM SULFATE 2 GM/50ML IV SOLN
2.0000 g | Freq: Once | INTRAVENOUS | Status: AC
  Administered 2019-07-20: 2 g via INTRAVENOUS

## 2019-07-20 MED ORDER — ROCURONIUM 10MG/ML (10ML) SYRINGE FOR MEDFUSION PUMP - OPTIME
INTRAVENOUS | Status: DC | PRN
  Administered 2019-07-20: 100 mg via INTRAVENOUS

## 2019-07-20 MED ORDER — INSULIN ASPART 100 UNIT/ML ~~LOC~~ SOLN
0.0000 [IU] | SUBCUTANEOUS | Status: DC
  Administered 2019-07-20: 04:00:00 2 [IU] via SUBCUTANEOUS
  Administered 2019-07-20 – 2019-07-23 (×10): 1 [IU] via SUBCUTANEOUS
  Administered 2019-07-23: 10:00:00 2 [IU] via SUBCUTANEOUS
  Administered 2019-07-24 (×3): 1 [IU] via SUBCUTANEOUS
  Administered 2019-07-24: 2 [IU] via SUBCUTANEOUS
  Administered 2019-07-24 – 2019-07-25 (×5): 1 [IU] via SUBCUTANEOUS

## 2019-07-20 MED ORDER — ORAL CARE MOUTH RINSE
15.0000 mL | OROMUCOSAL | Status: DC
  Administered 2019-07-20 – 2019-07-22 (×22): 15 mL via OROMUCOSAL

## 2019-07-20 MED ORDER — CEFAZOLIN SODIUM-DEXTROSE 2-3 GM-%(50ML) IV SOLR
INTRAVENOUS | Status: DC | PRN
  Administered 2019-07-20: 2 g via INTRAVENOUS

## 2019-07-20 MED ORDER — PROPOFOL 500 MG/50ML IV EMUL
INTRAVENOUS | Status: DC | PRN
  Administered 2019-07-20: 50 ug/kg/min via INTRAVENOUS

## 2019-07-20 MED ORDER — MIDAZOLAM HCL 2 MG/2ML IJ SOLN
1.0000 mg | INTRAMUSCULAR | Status: AC | PRN
  Administered 2019-07-20 (×3): 1 mg via INTRAVENOUS
  Filled 2019-07-20 (×2): qty 2

## 2019-07-20 MED ORDER — MAGNESIUM SULFATE 2 GM/50ML IV SOLN
INTRAVENOUS | Status: AC
  Filled 2019-07-20: qty 50

## 2019-07-20 MED ORDER — FENTANYL BOLUS VIA INFUSION
25.0000 ug | INTRAVENOUS | Status: DC | PRN
  Administered 2019-07-20: 04:00:00 25 ug via INTRAVENOUS
  Filled 2019-07-20: qty 50

## 2019-07-20 MED ORDER — PHENYLEPHRINE HCL (PRESSORS) 10 MG/ML IV SOLN
INTRAVENOUS | Status: DC | PRN
  Administered 2019-07-20: 40 ug via INTRAVENOUS

## 2019-07-20 MED ORDER — PHENYLEPHRINE HCL-NACL 10-0.9 MG/250ML-% IV SOLN
25.0000 ug/min | INTRAVENOUS | Status: DC
  Administered 2019-07-20: 100 ug/min via INTRAVENOUS
  Administered 2019-07-20: 04:00:00 25 ug/min via INTRAVENOUS
  Administered 2019-07-20 (×3): 100 ug/min via INTRAVENOUS
  Administered 2019-07-20: 25 ug/min via INTRAVENOUS
  Administered 2019-07-21: 02:00:00 20 ug/min via INTRAVENOUS
  Filled 2019-07-20: qty 500
  Filled 2019-07-20 (×3): qty 250
  Filled 2019-07-20: qty 500
  Filled 2019-07-20: qty 250

## 2019-07-20 MED ORDER — SODIUM CHLORIDE 0.9 % IV SOLN: 250.0000 mL | INTRAVENOUS | Status: DC

## 2019-07-20 MED ORDER — TICAGRELOR 90 MG PO TABS
ORAL_TABLET | ORAL | Status: AC
  Filled 2019-07-20: qty 2

## 2019-07-20 MED ORDER — STROKE: EARLY STAGES OF RECOVERY BOOK
Freq: Once | Status: AC
  Administered 2019-07-20: 22:00:00 1
  Filled 2019-07-20: qty 1

## 2019-07-20 MED ORDER — IOHEXOL 240 MG/ML SOLN
INTRAMUSCULAR | Status: AC
  Administered 2019-07-20: 02:00:00 75 mL
  Filled 2019-07-20: qty 200

## 2019-07-20 NOTE — Progress Notes (Signed)
PT Cancellation Note  Patient Details Name: Jamie Mitchell MRN: [id number] DOB: 1946/02/01   Cancelled Treatment:    Reason Eval/Treat Not Completed: Patient not medically ready (remains intubated; active bedrest order).    Wyona Almas, PT, DPT Acute Rehabilitation Services Pager [phone] Office [phone]    Deno Etienne 07/20/2019, 7:50 AM

## 2019-07-20 NOTE — Progress Notes (Signed)
eLink Physician-Brief Progress Note Patient Name: Harsha Maloy DOB: 1945/12/09 MRN: [id number]   Date of Service  07/20/2019  HPI/Events of Note  74 yo female presents with stroke symptoms. Now s/p mechanical thrombectomy of L MCA occlusion. Procedure complicated by small SAH. Now intubated and ventilated post procedure. SBP goal 120-140. PCCM asked to see in consult to help manage ventilator and medical issues. SBP = 102 by A-line.  Patient also having episodes of bradycardia into 40's.  eICU Interventions  Will order: 1. Phenylephrine IV infusion. Titrate to SBP = 120-140. 2. Atropine 1 mg IV at bedside at all times.      Intervention Category Evaluation Type: New Patient Evaluation  Lysle Dingwall 07/20/2019, 3:21 AM

## 2019-07-20 NOTE — Progress Notes (Signed)
Chaplain visited as a result of rounding. The chaplain engaged the family to see if any spiritual needs were present. The family was concerned, but looked tired. The chaplain will be available for follow-up if necessary.  Brion Aliment Chaplain Resident For questions concerning this note please contact me by pager [phone]

## 2019-07-20 NOTE — Progress Notes (Signed)
SLP Cancellation Note  Patient Details Name: Jamie Mitchell MRN: [id number] DOB: Feb 18, 1946   Cancelled treatment:       Reason Eval/Treat Not Completed: Patient not medically ready. Ventilated. Will follow for readiness.    Carl Butner, Katherene Ponto 07/20/2019, 7:39 AM

## 2019-07-20 NOTE — Anesthesia Procedure Notes (Signed)
Procedure Name: Intubation Date/Time: 07/20/2019 12:21 AM Performed by: Claris Che, CRNA Pre-anesthesia Checklist: Patient identified, Emergency Drugs available, Suction available, Patient being monitored and Timeout performed Patient Re-evaluated:Patient Re-evaluated prior to induction Oxygen Delivery Method: Circle system utilized Preoxygenation: Pre-oxygenation with 100% oxygen Induction Type: IV induction, Rapid sequence and Cricoid Pressure applied Laryngoscope Size: Mac and 4 Grade View: Grade II Tube type: Oral Tube size: 8.0 mm Number of attempts: 1 Airway Equipment and Method: Stylet Placement Confirmation: ETT inserted through vocal cords under direct vision,  CO2 detector and breath sounds checked- equal and bilateral Secured at: 22 cm Tube secured with: Tape Dental Injury: Teeth and Oropharynx as per pre-operative assessment

## 2019-07-20 NOTE — Brief Op Note (Signed)
07/19/2019 - 07/20/2019  2:27 AM  PATIENT:  Jamie Mitchell  74 y.o. female  PRE-OPERATIVE DIAGNOSIS:  Left MCA /M2 occlusion  POST-OPERATIVE DIAGNOSIS:   Left MCA /M2 occlusion  PROCEDURE:  Diagnostic cerebral angiogram  + mechanical thrombectomy  SURGEON: Dr. Pedro Earls  PHYSICIAN ASSISTANT: None  ASSISTANTS: none   ANESTHESIA:   general   ACCESS: Right common femoral artery/ CLOSURE: 36F Perclose  EBL: 200 mL   BLOOD ADMINISTERED:none  LOCAL MEDICATIONS USED:  NONE  SPECIMEN:  No Specimen   FINDINGS: There was a left M2/MCA superior division branch occlusion. Mechanical thrombectomy + aspiration performed x 3, followed by direct contact aspiration. TICI 2C recanalization achieved with slow flow in a distal posterior parietal branch. Flat panel CT showed moderate left sylvian SAH.   PLAN OF CARE: Admit to inpatient   PATIENT DISPOSITION:  ICU - intubated and hemodynamically stable.   Delay start of Pharmacological VTE agent (>24hrs) due to surgical blood loss or risk of bleeding: post TPA.

## 2019-07-20 NOTE — Progress Notes (Signed)
RT NOTE: RT transported patient on ventilator from room 4N32 to CT and back to room 4N32 with no complications. Vitals are stable. RT will continue to monitor. 

## 2019-07-20 NOTE — Anesthesia Procedure Notes (Signed)
Arterial Line Insertion Start/End2/25/2021 12:29 AM, 07/20/2019 12:33 AM Performed by: Oleta Mouse, MD, anesthesiologist  Patient location: OOR procedure area. Preanesthetic checklist: patient identified, IV checked, site marked, risks and benefits discussed, surgical consent, monitors and equipment checked, pre-op evaluation, timeout performed and anesthesia consent Lidocaine 1% used for infiltration Left, radial was placed Catheter size: 20 G Hand hygiene performed  and maximum sterile barriers used   Attempts: 1 Procedure performed without using ultrasound guided technique. Following insertion, dressing applied and Biopatch. Post procedure assessment: normal and unchanged  Patient tolerated the procedure well with no immediate complications.

## 2019-07-20 NOTE — Consult Note (Addendum)
Cardiology Consultation:   Patient ID: Jamie Mitchell MRN: [id number]; DOB: 27-Jan-1946  Admit date: 07/19/2019 Date of Consult: 07/20/2019  Primary Care Provider: Alfonse Spruce, FNP Primary Cardiologist: Donato Heinz, MD new Primary Electrophysiologist:  None    Patient Profile:   Jamie Mitchell is a 74 y.o. female with a hx of hypertension, HLD, ?PAD, and thyroid disease who is being seen today for the evaluation of Afib, tachy-brady at the request of Dr. Cheral Marker.  History of Present Illness:   Ms. Jamie Mitchell with the above history presented to [facility] with right sided weakness, left gaze, and dysarthria. CODE Stroke was activated - TPA as administrated. Pt was intubated. During TPA infusion CTA head with M2 occlusion. Thrombectomy and revascularization complicated by subarachnoid hemorrhage (moderate left sylvian SAH). Pt was taken to neuro ICU found to be in Afib with heart rates intermittently in the 50s with mild hypotension requiring neo. Cardiology was consulted for further management.  Pt remains intubated and sedated, level 5 Caveat.  I was able to speak with the patient's son, [name], who reports his mother arrived from [place] by airplane last Wed and has been compliant on medications from [place]. He confirms her PMH. I reviewed her medications from [place], which did not include an anticoagulant. Pt son confirms circulation issues in her legs and what sounds like claudication; denies vascular procedures. She had a left knee replacement in [place] within the last three years and has been doing well with that.   Telemetry reviewed. Pt has sinus rhythm with intermittent atrial flutter with HR 50-130s with post-conversion pauses, longest pause was 5.62 sec. She has just returned from CT head on my exam - RVR this morning corresponded with CT and weaning propofol. Telemetry with only brief bouts of Afib RVR, now with sustained HR in the 100-140s.     Medications from [place] reviewed: 20 mg atorvastatin 400 mg pentoxifilina (pensiral) 5 mg amlodipine 300 mg ASA - efervescent 100 mg levothyroxine    Heart Pathway Score:     Past Medical History:  Diagnosis Date  . Hypertension   . Thyroid disease     History reviewed. No pertinent surgical history.   Home Medications:  Prior to Admission medications   Medication Sig Start Date End Date Taking? Authorizing Provider  aspirin 81 MG tablet Take 1 tablet (81 mg total) by mouth daily. Reported on 07/31/2015 02/26/17   Alfonse Spruce, FNP  atorvastatin (LIPITOR) 40 MG tablet Take 1 tablet (40 mg total) by mouth daily. 03/04/17   Alfonse Spruce, FNP  hydrochlorothiazide (HYDRODIURIL) 25 MG tablet Take 1 tablet (25 mg total) by mouth daily. 02/26/17   Alfonse Spruce, FNP  levothyroxine (SYNTHROID, LEVOTHROID) 75 MCG tablet Take 1 tablet (75 mcg total) by mouth daily. 03/04/17   Alfonse Spruce, FNP  naproxen (NAPROSYN) 500 MG tablet Take 1 tablet (500 mg total) by mouth every 12 (twelve) hours as needed for moderate pain (Take with food.). 02/26/17   Alfonse Spruce, FNP  traMADol (ULTRAM) 50 MG tablet Take 1 tablet (50 mg total) by mouth every 12 (twelve) hours as needed for severe pain (Take with food). 02/26/17   Alfonse Spruce, FNP    Inpatient Medications: Scheduled Meds: .  stroke: mapping our early stages of recovery book   Does not apply Once  . atorvastatin  40 mg Oral Daily  . atropine  1 mg Intravenous Once  . chlorhexidine gluconate (MEDLINE KIT)  15 mL  Mouth Rinse BID  . Chlorhexidine Gluconate Cloth  6 each Topical Daily  . insulin aspart  0-9 Units Subcutaneous Q4H  . ipratropium-albuterol  3 mL Nebulization Q4H  . levothyroxine  75 mcg Per Tube Daily  . mouth rinse  15 mL Mouth Rinse 10 times per day  . pantoprazole (PROTONIX) IV  40 mg Intravenous Daily  . ticagrelor       Continuous Infusions: . sodium chloride    . sodium  chloride 75 mL/hr at 07/20/19 0800  . sodium chloride    . ceFAZolin    . fentaNYL infusion INTRAVENOUS 25 mcg/hr (07/20/19 0800)  . phenylephrine (NEO-SYNEPHRINE) Adult infusion 25 mcg/min (07/20/19 0804)   PRN Meds: acetaminophen **OR** acetaminophen (TYLENOL) oral liquid 160 mg/5 mL **OR** acetaminophen, fentaNYL, midazolam, ondansetron (ZOFRAN) IV, senna-docusate  Allergies:   No Known Allergies  Social History:   Social History   Socioeconomic History  . Marital status: Married    Spouse name: Not on file  . Number of children: Not on file  . Years of education: Not on file  . Highest education level: Not on file  Occupational History  . Not on file  Tobacco Use  . Smoking status: Never Smoker  . Smokeless tobacco: Never Used  Substance and Sexual Activity  . Alcohol use: No  . Drug use: No  . Sexual activity: Not on file  Other Topics Concern  . Not on file  Social History Narrative  . Not on file   Social Determinants of Health   Financial Resource Strain:   . Difficulty of Paying Living Expenses: Not on file  Food Insecurity:   . Worried About Charity fundraiser in the Last Year: Not on file  . Ran Out of Food in the Last Year: Not on file  Transportation Needs:   . Lack of Transportation (Medical): Not on file  . Lack of Transportation (Non-Medical): Not on file  Physical Activity:   . Days of Exercise per Week: Not on file  . Minutes of Exercise per Session: Not on file  Stress:   . Feeling of Stress : Not on file  Social Connections:   . Frequency of Communication with Friends and Family: Not on file  . Frequency of Social Gatherings with Friends and Family: Not on file  . Attends Religious Services: Not on file  . Active Member of Clubs or Organizations: Not on file  . Attends Archivist Meetings: Not on file  . Marital Status: Not on file  Intimate Partner Violence:   . Fear of Current or Ex-Partner: Not on file  . Emotionally Abused:  Not on file  . Physically Abused: Not on file  . Sexually Abused: Not on file    Family History:    Family History  Family history unknown: Yes     ROS:  Please see the history of present illness.   All other ROS reviewed and negative.     Physical Exam/Data:   Vitals:   07/20/19 0748 07/20/19 0749 07/20/19 0754 07/20/19 0800  BP: 126/67   (!) 112/57  Pulse: 70   67  Resp: (!) 21   14  Temp:   98.3 F (36.8 C)   TempSrc:   Axillary   SpO2: 96% 96% 98% 98%  Weight:      Height:        Intake/Output Summary (Last 24 hours) at 07/20/2019 0848 Last data filed at 07/20/2019 0800 Gross per 24 hour  Intake 1828.97 ml  Output 25 ml  Net 1803.97 ml   Last 3 Weights 07/19/2019 07/19/2019 02/26/2017  Weight (lbs) 151 lb 10.8 oz 151 lb 10.8 oz 143 lb 9.6 oz  Weight (kg) 68.8 kg 68.8 kg 65.137 kg     Body mass index is 27.74 kg/m.  General:  Pt intubated on fentanyl HEENT: normal Neck: no JVD Vascular: No carotid bruits Cardiac:  Irregular rhythm, tachycardic rate Lungs:  intubated Abd: soft, nontender, no hepatomegaly  Ext: no edema Musculoskeletal:  No deformities, BUE and BLE strength normal and equal Skin: warm and dry  Neuro:  CNs 2-12 intact, no focal abnormalities noted Psych:  Normal affect   EKG:  The EKG was personally reviewed and demonstrates:  Atrial fibrillation with ventricular rate 156 Telemetry:  Telemetry was personally reviewed and demonstrates:  Atrial flutter with bouts of RVR, sinus rhythm and post-conversion pauses, longest 5.62 sec  Relevant CV Studies:  none  Laboratory Data:  High Sensitivity Troponin:  No results for input(s): TROPONINIHS in the last 720 hours.   Chemistry Recent Labs  Lab 07/19/19 2300 07/19/19 2300 07/19/19 2305 07/20/19 0356 07/20/19 0426  NA 140   < > 140 137 142  K 4.3   < > 4.2 4.0 4.0  CL 107  --  105 110  --   CO2 24  --   --  20*  --   GLUCOSE 105*  --  101* 192*  --   BUN 18  --  19 14  --   CREATININE  0.92  --  0.90 0.82  --   CALCIUM 9.2  --   --  7.7*  --   GFRNONAA >60  --   --  >60  --   GFRAA >60  --   --  >60  --   ANIONGAP 9  --   --  7  --    < > = values in this interval not displayed.    Recent Labs  Lab 07/19/19 2300  PROT 7.8  ALBUMIN 4.0  AST 22  ALT 15  ALKPHOS 106  BILITOT 0.9   Hematology Recent Labs  Lab 07/19/19 2300 07/19/19 2300 07/19/19 2305 07/20/19 0356 07/20/19 0426  WBC 6.9  --   --  9.2  --   RBC 4.91  --   --  4.62  --   HGB 14.8   < > 15.0 13.7 14.3  HCT 45.4   < > 44.0 42.3 42.0  MCV 92.5  --   --  91.6  --   MCH 30.1  --   --  29.7  --   MCHC 32.6  --   --  32.4  --   RDW 13.9  --   --  13.8  --   PLT 256  --   --  264  --    < > = values in this interval not displayed.   BNPNo results for input(s): BNP, PROBNP in the last 168 hours.  DDimer No results for input(s): DDIMER in the last 168 hours.   Radiology/Studies:  CT Code Stroke CTA Head W/WO contrast  Result Date: 07/19/2019 CLINICAL DATA:  Initial evaluation for acute right-sided weakness. EXAM: CT ANGIOGRAPHY HEAD AND NECK TECHNIQUE: Multidetector CT imaging of the head and neck was performed using the standard protocol during bolus administration of intravenous contrast. Multiplanar CT image reconstructions and MIPs were obtained to evaluate the vascular anatomy. Carotid stenosis measurements (when applicable) are  obtained utilizing NASCET criteria, using the distal internal carotid diameter as the denominator. CONTRAST:  53m OMNIPAQUE IOHEXOL 350 MG/ML SOLN COMPARISON:  Prior head CT from earlier same day. FINDINGS: CTA NECK FINDINGS Aortic arch: Visualized aortic arch normal in caliber with normal branch pattern. Bovine branching pattern with common origin of the right brachiocephalic and left common carotid artery noted. No hemodynamically significant stenosis seen about the origin of the great vessels. Subclavian arteries widely patent. Right carotid system: Right common and  internal carotid arteries widely patent to the skull base without stenosis, dissection or occlusion. Proximal right ICA medialized into the retropharyngeal space. Focal tortuosity the distal cervical right ICA noted. Left carotid system: Left common and internal carotid arteries widely patent to the skull base without stenosis, dissection or occlusion. Proximal left ICA medialized into the retropharyngeal space. Evaluation the distal cervical left ICA limited by motion. Vertebral arteries: Both vertebral arteries arise from the subclavian arteries. Vertebral arteries mildly tortuous but widely patent within the neck without stenosis, dissection or occlusion. Skeleton: No acute osseous abnormality. No discrete lytic or blastic osseous lesions. Prominent degenerative changes noted at the C1-2 articulation, greater on the right. Other neck: No other acute soft tissue abnormality within the neck. No mass lesion or adenopathy. Upper chest: Mild scattered atelectatic changes noted within the visualized lungs. Visualized upper chest demonstrates no acute finding. Review of the MIP images confirms the above findings CTA HEAD FINDINGS Anterior circulation: Examination mildly degraded by motion artifact. Petrous, cavernous, and supraclinoid ICAs widely patent without flow-limiting stenosis. ICA termini well perfused. A1 segments patent bilaterally. Normal anterior communicating artery complex. Anterior cerebral arteries widely patent to their distal aspects without stenosis. Right M1 widely patent. Normal right MCA bifurcation. Distal right MCA branches well perfused. Left M1 widely patent. Normal left MCA bifurcation. Occlusive thrombus seen within a proximal left M2 branch, superior division (series 7, image 94), acute in nature. Attenuated opacification of left MCA branches distally, either due to subocclusive thrombus and/or moderate collateralization. Remainder of the left MCA branches remain patent. Posterior  circulation: Vertebral arteries grossly patent to the vertebrobasilar junction without appreciable stenosis, although evaluation limited by motion. Neither PICA well visualized. Basilar diminutive but patent to its distal aspect without appreciable stenosis. Superior cerebral arteries patent bilaterally. Predominant fetal type origin of both PCAs, supplied via widely patent and robust posterior communicating arteries. Both PCAs well perfused to their distal aspects. Venous sinuses: Grossly patent allowing for timing the contrast bolus. Anatomic variants: Fetal type origin of the PCAs with overall diminutive vertebrobasilar system. No appreciable aneurysm. Review of the MIP images confirms the above findings IMPRESSION: 1. Positive CTA for emergent large vessel occlusion, with occlusive thrombus within a proximal left M2 branch, superior division. Attenuated flow distally either due to subocclusive thrombus and/or collateralization. 2. Diffuse tortuosity about the major arterial vasculature of the head and neck, suggesting chronic underlying hypertension. No other hemodynamically significant or correctable stenosis. 3. Fetal type origin of the PCAs with overall diminutive vertebrobasilar system. Critical Value/emergent results were called by telephone at the time of interpretation on 07/19/2019 at 11:26 pm to provider [facility], who verbally acknowledged these results. Electronically Signed   By: BJeannine BogaM.D.   On: 07/19/2019 23:53   CT Code Stroke CTA Neck W/WO contrast  Result Date: 07/19/2019 CLINICAL DATA:  Initial evaluation for acute right-sided weakness. EXAM: CT ANGIOGRAPHY HEAD AND NECK TECHNIQUE: Multidetector CT imaging of the head and neck was performed using the standard protocol  during bolus administration of intravenous contrast. Multiplanar CT image reconstructions and MIPs were obtained to evaluate the vascular anatomy. Carotid stenosis measurements (when applicable) are obtained  utilizing NASCET criteria, using the distal internal carotid diameter as the denominator. CONTRAST:  62m OMNIPAQUE IOHEXOL 350 MG/ML SOLN COMPARISON:  Prior head CT from earlier same day. FINDINGS: CTA NECK FINDINGS Aortic arch: Visualized aortic arch normal in caliber with normal branch pattern. Bovine branching pattern with common origin of the right brachiocephalic and left common carotid artery noted. No hemodynamically significant stenosis seen about the origin of the great vessels. Subclavian arteries widely patent. Right carotid system: Right common and internal carotid arteries widely patent to the skull base without stenosis, dissection or occlusion. Proximal right ICA medialized into the retropharyngeal space. Focal tortuosity the distal cervical right ICA noted. Left carotid system: Left common and internal carotid arteries widely patent to the skull base without stenosis, dissection or occlusion. Proximal left ICA medialized into the retropharyngeal space. Evaluation the distal cervical left ICA limited by motion. Vertebral arteries: Both vertebral arteries arise from the subclavian arteries. Vertebral arteries mildly tortuous but widely patent within the neck without stenosis, dissection or occlusion. Skeleton: No acute osseous abnormality. No discrete lytic or blastic osseous lesions. Prominent degenerative changes noted at the C1-2 articulation, greater on the right. Other neck: No other acute soft tissue abnormality within the neck. No mass lesion or adenopathy. Upper chest: Mild scattered atelectatic changes noted within the visualized lungs. Visualized upper chest demonstrates no acute finding. Review of the MIP images confirms the above findings CTA HEAD FINDINGS Anterior circulation: Examination mildly degraded by motion artifact. Petrous, cavernous, and supraclinoid ICAs widely patent without flow-limiting stenosis. ICA termini well perfused. A1 segments patent bilaterally. Normal anterior  communicating artery complex. Anterior cerebral arteries widely patent to their distal aspects without stenosis. Right M1 widely patent. Normal right MCA bifurcation. Distal right MCA branches well perfused. Left M1 widely patent. Normal left MCA bifurcation. Occlusive thrombus seen within a proximal left M2 branch, superior division (series 7, image 94), acute in nature. Attenuated opacification of left MCA branches distally, either due to subocclusive thrombus and/or moderate collateralization. Remainder of the left MCA branches remain patent. Posterior circulation: Vertebral arteries grossly patent to the vertebrobasilar junction without appreciable stenosis, although evaluation limited by motion. Neither PICA well visualized. Basilar diminutive but patent to its distal aspect without appreciable stenosis. Superior cerebral arteries patent bilaterally. Predominant fetal type origin of both PCAs, supplied via widely patent and robust posterior communicating arteries. Both PCAs well perfused to their distal aspects. Venous sinuses: Grossly patent allowing for timing the contrast bolus. Anatomic variants: Fetal type origin of the PCAs with overall diminutive vertebrobasilar system. No appreciable aneurysm. Review of the MIP images confirms the above findings IMPRESSION: 1. Positive CTA for emergent large vessel occlusion, with occlusive thrombus within a proximal left M2 branch, superior division. Attenuated flow distally either due to subocclusive thrombus and/or collateralization. 2. Diffuse tortuosity about the major arterial vasculature of the head and neck, suggesting chronic underlying hypertension. No other hemodynamically significant or correctable stenosis. 3. Fetal type origin of the PCAs with overall diminutive vertebrobasilar system. Critical Value/emergent results were called by telephone at the time of interpretation on 07/19/2019 at 11:26 pm to provider [facility]IC [facility], who verbally acknowledged these  results. Electronically Signed   By: BJeannine BogaM.D.   On: 07/19/2019 23:53   CT HEAD CODE STROKE WO CONTRAST  Result Date: 07/19/2019 CLINICAL DATA:  Code stroke.  Initial evaluation for right-sided facial droop with right-sided weakness. EXAM: CT HEAD WITHOUT CONTRAST TECHNIQUE: Contiguous axial images were obtained from the base of the skull through the vertex without intravenous contrast. COMPARISON:  None. FINDINGS: Brain: Mild age-related cerebral atrophy. Small focus of encephalomalacia involving the cortical/subcortical high left frontal lobe consistent with a small chronic left MCA territory infarct (series 3, image 25). Few scattered parenchymal calcifications noted at the right frontal and left parietal region. There is subtle loss of gray-white matter differentiation involving the region of the left frontal operculum, suspicious for early/developing acute left MCA territory infarct. Left insula relatively well maintained at this time as are the deep gray nuclei. No intracranial hemorrhage or mass effect. No other acute large vessel territory infarct. No mass lesion or midline shift. No hydrocephalus. No extra-axial fluid collection. Vascular: Asymmetric hyperdensity seen M2 branch (series 6, image 45), suspicious for LV of. Of distal left M1 and/or proximal Skull: Scalp soft tissues and calvarium within normal limits. Sinuses/Orbits: Left gaze noted. Globes and orbital soft tissues otherwise unremarkable. Mild mucosal thickening noted within the right sphenoid sinus. Chronic appearing left mastoid and middle ear effusion noted. Visualized nasopharynx within normal limits. Other: None. ASPECTS [facility] Stroke Program Early CT Score) - Ganglionic level infarction (caudate, lentiform nuclei, internal capsule, insula, M1-M3 cortex): 7 - Supraganglionic infarction (M4-M6 cortex): 2 Total score (0-10 with 10 being normal): 9 IMPRESSION: 1. Subtle loss of gray-white matter differentiation involving  the left frontal operculum, concerning for acute left MCA territory infarct. Hyperdensity at the level of a distal left M1/proximal M2 branch concerning for LVO. Further assessment with dedicated CTA recommended. No intracranial hemorrhage. 2. ASPECTS is 9. 3. Small chronic high left frontal infarct. Critical Value/emergent results were called by telephone at the time of interpretation on 07/19/2019 at 11:10 pm to provider ERIC [facility] , who verbally acknowledged these results. Electronically Signed   By: Jeannine Boga M.D.   On: 07/19/2019 23:35   {  Assessment and Plan:   1. Paroxysmal atrial fibrillation/atrial flutter - appears to be a new diagnosis 2. Possible tachy-brady - EKG with Afib ventricular rate 156 - TSH 6.781, free T4 pending - no prior history of stroke - home medications reviewed from her family - do not include anticoagulant; patient's son denies history of arrhythmias - This patients CHA2DS2-VASc Score and unadjusted Ischemic Stroke Rate (% per year) is equal to 9.7 % stroke rate/year from a score of 6 (age, 2stroke, HTN, female, PAD - based on med list) - per telemetry, patient has been largely rate-controlled with bouts of sinus rhythm until this morning with propofol was weaned for soft pressures and she was taken to CT head - now Afib fluctuating in the 90-130s - however, do appreciate long post-conversion pauses, longest 5.6 sec at ~0550 today - Will consult EP for rate/rhythm control strategies vs temp wire for pauses - recommend echocardiogram - initial plan for extubation today, but patient vomited - neuro/PCCM recommend continued intubation today - patient remains on neo   3. Hx of Hypertension - holding home meds - amlodipine - BP 185U-314H systolic - on neo - defer to neuro to dictate BP goals while acutely ill   4. CVA 5. Subarachnoid hemorrhage - holding anticoagulation - per neuro - remains intuabed - plan MRI 24 hr post TPA - will be  tonight   6. Prediabetes - A1c 6.0% - defer to PCP at [facility]      For questions or  updates, please contact [place] Please consult [email or web address] for contact info under     Signed, Ledora Bottcher, [place]  07/20/2019 8:48 AM   Patient seen and examined.  Agree with above documentation.  Ms. Jamie Mitchell  is a 74 year old female with history of hypertension, hyperlipidemia, possible PAD, thyroid disease.  Cardiology is consulted today for evaluation of atrial fibrillation/tachybradycardia syndrome at the request of Dr. Cheral Marker.  Patient moved from [place] to [place] last week.  She did not have a reported history of atrial fibrillation.  She was admitted with an acute stroke.  TPA was administered.  CTA head showed M2 occlusion.  Underwent thrombectomy, which was complicated by subarachnoid hemorrhage.  She was admitted to the neuro ICU and found to be intermittently in atrial fibrillation with significant postconversion pauses.  Telemetry personally reviewed, shows sinus rhythm with rate in 50s, along with intermittent episodes of atrial fibrillation with rates up to 110s to 120s.  Also having significant postconversion pauses, with longest lasting 5.6 seconds.  On exam, patient is intubated but following commands, regular rate and rhythm, no murmurs, lungs clear in anterior fields, no LE edema.  Given her tachybrady syndrome with A. fib with RVR and significant postconversion pauses, will discuss with EP.  May benefit from starting antiarrhythmic to maintain sinus rhythm.  Suspect related to acute CVA/SAH.  If continues to have issues with tachybrady once recovers, would need PPM.  Donato Heinz, MD

## 2019-07-20 NOTE — Transfer of Care (Signed)
Immediate Anesthesia Transfer of Care Note  Patient: Jamie Mitchell  Procedure(s) Performed: IR WITH ANESTHESIA (N/A )  Patient Location: ICU  Anesthesia Type:General  Level of Consciousness: sedated and Patient remains intubated per anesthesia plan  Airway & Oxygen Therapy: Patient remains intubated per anesthesia plan and Patient placed on Ventilator (see vital sign flow sheet for setting)  Post-op Assessment: Report given to RN, Post -op Vital signs reviewed and stable and Patient moving all extremities X 4  Post vital signs: Reviewed and stable  Last Vitals:  Vitals Value Taken Time  BP    Temp    Pulse 25 07/20/19 0253  Resp 16 07/20/19 0253  SpO2 93 % 07/20/19 0253  Vitals shown include unvalidated device data.  Last Pain:  Vitals:   07/19/19 2346  TempSrc:   PainSc: 0-No pain         Complications: No apparent anesthesia complications

## 2019-07-20 NOTE — Progress Notes (Signed)
  Echocardiogram 2D Echocardiogram has been performed.  Jamie Mitchell 07/20/2019, 12:20 PM

## 2019-07-20 NOTE — Consult Note (Addendum)
-  ELECTROPHYSIOLOGY CONSULT NOTE    Patient ID: Jamie Mitchell MRN: [id number], DOB/AGE: 10/11/1945 74 y.o.  Admit date: 07/19/2019 Date of Consult: 07/20/2019  Primary Physician: Alfonse Spruce, FNP Primary Cardiologist: Donato Heinz, MD  Electrophysiologist: New  Referring Provider: Dr. Gardiner Rhyme  Patient Profile: Jamie Mitchell is a 74 y.o. female with a history of HTN, HLD, ?PAD, and thyroid disease who is being seen today for the evaluation of Paroxysmal afib with post conversion pauses at the request of Dr. Cheral Marker.  HPI:  Jamie Mitchell is a 74 y.o. female with history above who presented to [facility] with R sided weakness, left gaze, and dysarthria. Code Stroke -> TPA -> intubation. Turing TPA infusion CTA head showed M2 occlusion. Thrombectomy and revascularization complicated by SAH (moderate left sylvian SAH). Pt was taken to neuro ICD and found to be in AF with post conversion pauses and mild hypotension requiring neo.  She remains intubated and sedated. Had planned to extubated today, with had vomiting and with pauses, wanted plan in place for rhythm first.   PT arrived from [place] by plane last Wednesday. Was her USOH at that point. He does report "circulation issues" in her legs. No procedures. She has h/o L knee replacement.   Tele showed sinus rhythm with occasional AFL with HRs 50-130s with post conversion pauses ranging 2.5 to longest of 5.62 seconds.   Echo this am shows normal EF 60-65%, mild MR, Normal LA/RA.  Pt is intubated and sedated. HPI is obtained from chart and notes.   Past Medical History:  Diagnosis Date   Hypertension    Thyroid disease      Surgical History:  Past Surgical History:  Procedure Laterality Date   IR CT HEAD LTD  07/20/2019   IR PERCUTANEOUS ART THROMBECTOMY/INFUSION INTRACRANIAL INC DIAG ANGIO  07/20/2019     Medications Prior to Admission  Medication Sig Dispense Refill Last Dose    amLODipine (NORVASC) 5 MG tablet Take 5 mg by mouth daily.   07/19/2019 at Unknown time   ASPIRIN PO Take 150 mg by mouth at bedtime.   07/19/2019 at Unknown time   atorvastatin (LIPITOR) 20 MG tablet Take 20 mg by mouth daily.   07/19/2019 at Unknown time   levothyroxine (SYNTHROID) 100 MCG tablet Take 50-75 mcg by mouth See admin instructions. 78mg one day, 760m the next day, then repeat.   07/19/2019 at Unknown time   pentoxifylline (TRENTAL) 400 MG CR tablet Take 400 mg by mouth daily.   07/19/2019 at Unknown time    Inpatient Medications:    stroke: mapping our early stages of recovery book   Does not apply Once   atorvastatin  40 mg Oral Daily   atropine  1 mg Intravenous Once   chlorhexidine gluconate (MEDLINE KIT)  15 mL Mouth Rinse BID   Chlorhexidine Gluconate Cloth  6 each Topical Daily   insulin aspart  0-9 Units Subcutaneous Q4H   ipratropium-albuterol  3 mL Nebulization Q4H   levothyroxine  75 mcg Per Tube Daily   mouth rinse  15 mL Mouth Rinse 10 times per day   pantoprazole (PROTONIX) IV  40 mg Intravenous Q12H   ticagrelor        Allergies: No Known Allergies  Social History   Socioeconomic History   Marital status: Married    Spouse name: Not on file   Number of children: Not on file   Years of education: Not on file   Highest  education level: Not on file  Occupational History   Not on file  Tobacco Use   Smoking status: Never Smoker   Smokeless tobacco: Never Used  Substance and Sexual Activity   Alcohol use: No   Drug use: No   Sexual activity: Not on file  Other Topics Concern   Not on file  Social History Narrative   Not on file   Social Determinants of Health   Financial Resource Strain:    Difficulty of Paying Living Expenses: Not on file  Food Insecurity:    Worried About [place] in the Last Year: Not on file   Ran Out of Food in the Last Year: Not on file  Transportation Needs:    Lack of Transportation (Medical): Not on file    Lack of Transportation (Non-Medical): Not on file  Physical Activity:    Days of Exercise per Week: Not on file   Minutes of Exercise per Session: Not on file  Stress:    Feeling of Stress : Not on file  Social Connections:    Frequency of Communication with Friends and Family: Not on file   Frequency of Social Gatherings with Friends and Family: Not on file   Attends Religious Services: Not on file   Active Member of Clubs or Organizations: Not on file   Attends Archivist Meetings: Not on file   Marital Status: Not on file  Intimate Partner Violence:    Fear of Current or Ex-Partner: Not on file   Emotionally Abused: Not on file   Physically Abused: Not on file   Sexually Abused: Not on file     Family History  Family history unknown: Yes     Review of Systems: All other systems reviewed and are otherwise negative except as noted above.  Physical Exam: Vitals:   07/20/19 0749 07/20/19 0754 07/20/19 0800 07/20/19 0900  BP:   (!) 112/57 115/71  Pulse:   67 82  Resp:   14 13  Temp:  98.3 F (36.8 C)    TempSrc:  Axillary    SpO2: 96% 98% 98% 98%  Weight:      Height:        GEN- The patient is intubated and sedated.    HEENT: normocephalic, atraumatic; sclera clear, conjunctiva pink; hearing intact; oropharynx clear; neck supple Lungs- +Mechanical breathing sounds.  Heart- Regular rate and rhythm at time of my exam.  GI- soft, non-tender, non-distended, bowel sounds present Extremities- no clubbing, cyanosis, or edema; DP/PT/radial pulses 2+ bilaterally MS- no significant deformity or atrophy Skin- warm and dry, no rash or lesion Neuro- Sedated  Labs:   Lab Results  Component Value Date   WBC 9.2 07/20/2019   HGB 13.3 07/20/2019   HCT 39.0 07/20/2019   MCV 91.6 07/20/2019   PLT 264 07/20/2019    Recent Labs  Lab 07/19/19 2300 07/19/19 2305 07/20/19 0356 07/20/19 0426 07/20/19 0901  NA 140   < > 137   < > 143  K 4.3   < > 4.0   < > 4.0  CL  107   < > 110  --   --   CO2 24   < > 20*  --   --   BUN 18   < > 14  --   --   CREATININE 0.92   < > 0.82  --   --   CALCIUM 9.2   < > 7.7*  --   --  PROT 7.8  --   --   --   --   BILITOT 0.9  --   --   --   --   ALKPHOS 106  --   --   --   --   ALT 15  --   --   --   --   AST 22  --   --   --   --   GLUCOSE 105*   < > 192*  --   --    < > = values in this interval not displayed.      Radiology/Studies: CT Code Stroke CTA Head W/WO contrast  Result Date: 07/19/2019 CLINICAL DATA:  Initial evaluation for acute right-sided weakness. EXAM: CT ANGIOGRAPHY HEAD AND NECK TECHNIQUE: Multidetector CT imaging of the head and neck was performed using the standard protocol during bolus administration of intravenous contrast. Multiplanar CT image reconstructions and MIPs were obtained to evaluate the vascular anatomy. Carotid stenosis measurements (when applicable) are obtained utilizing NASCET criteria, using the distal internal carotid diameter as the denominator. CONTRAST:  75m OMNIPAQUE IOHEXOL 350 MG/ML SOLN COMPARISON:  Prior head CT from earlier same day. FINDINGS: CTA NECK FINDINGS Aortic arch: Visualized aortic arch normal in caliber with normal branch pattern. Bovine branching pattern with common origin of the right brachiocephalic and left common carotid artery noted. No hemodynamically significant stenosis seen about the origin of the great vessels. Subclavian arteries widely patent. Right carotid system: Right common and internal carotid arteries widely patent to the skull base without stenosis, dissection or occlusion. Proximal right ICA medialized into the retropharyngeal space. Focal tortuosity the distal cervical right ICA noted. Left carotid system: Left common and internal carotid arteries widely patent to the skull base without stenosis, dissection or occlusion. Proximal left ICA medialized into the retropharyngeal space. Evaluation the distal cervical left ICA limited by motion.  Vertebral arteries: Both vertebral arteries arise from the subclavian arteries. Vertebral arteries mildly tortuous but widely patent within the neck without stenosis, dissection or occlusion. Skeleton: No acute osseous abnormality. No discrete lytic or blastic osseous lesions. Prominent degenerative changes noted at the C1-2 articulation, greater on the right. Other neck: No other acute soft tissue abnormality within the neck. No mass lesion or adenopathy. Upper chest: Mild scattered atelectatic changes noted within the visualized lungs. Visualized upper chest demonstrates no acute finding. Review of the MIP images confirms the above findings CTA HEAD FINDINGS Anterior circulation: Examination mildly degraded by motion artifact. Petrous, cavernous, and supraclinoid ICAs widely patent without flow-limiting stenosis. ICA termini well perfused. A1 segments patent bilaterally. Normal anterior communicating artery complex. Anterior cerebral arteries widely patent to their distal aspects without stenosis. Right M1 widely patent. Normal right MCA bifurcation. Distal right MCA branches well perfused. Left M1 widely patent. Normal left MCA bifurcation. Occlusive thrombus seen within a proximal left M2 branch, superior division (series 7, image 94), acute in nature. Attenuated opacification of left MCA branches distally, either due to subocclusive thrombus and/or moderate collateralization. Remainder of the left MCA branches remain patent. Posterior circulation: Vertebral arteries grossly patent to the vertebrobasilar junction without appreciable stenosis, although evaluation limited by motion. Neither PICA well visualized. Basilar diminutive but patent to its distal aspect without appreciable stenosis. Superior cerebral arteries patent bilaterally. Predominant fetal type origin of both PCAs, supplied via widely patent and robust posterior communicating arteries. Both PCAs well perfused to their distal aspects. Venous  sinuses: Grossly patent allowing for timing the contrast bolus. Anatomic variants: Fetal type  origin of the PCAs with overall diminutive vertebrobasilar system. No appreciable aneurysm. Review of the MIP images confirms the above findings IMPRESSION: 1. Positive CTA for emergent large vessel occlusion, with occlusive thrombus within a proximal left M2 branch, superior division. Attenuated flow distally either due to subocclusive thrombus and/or collateralization. 2. Diffuse tortuosity about the major arterial vasculature of the head and neck, suggesting chronic underlying hypertension. No other hemodynamically significant or correctable stenosis. 3. Fetal type origin of the PCAs with overall diminutive vertebrobasilar system. Critical Value/emergent results were called by telephone at the time of interpretation on 07/19/2019 at 11:26 pm to provider ERIC [facility] , who verbally acknowledged these results. Electronically Signed   By: Jeannine Boga M.D.   On: 07/19/2019 23:53   DG Chest 1 View  Result Date: 07/20/2019 CLINICAL DATA:  Intubation EXAM: CHEST  1 VIEW COMPARISON:  None. FINDINGS: Endotracheal tube tip is positioned within the proximal right mainstem bronchus. Heart size is mildly enlarged. No focal airspace consolidation. No pleural effusion or pneumothorax. IMPRESSION: 1. Endotracheal tube tip within the proximal right mainstem bronchus. Recommend retraction approximately 3.0 cm. 2. Mild cardiomegaly. These results will be called to the ordering clinician or representative by the Radiologist Assistant, and communication documented in the PACS or zVision Dashboard. Electronically Signed   By: Davina Poke D.O.   On: 07/20/2019 10:36   CT HEAD WO CONTRAST  Result Date: 07/20/2019 CLINICAL DATA:  Stroke, follow-up. EXAM: CT HEAD WITHOUT CONTRAST TECHNIQUE: Contiguous axial images were obtained from the base of the skull through the vertex without intravenous contrast. COMPARISON:   Postprocedure head CT 07/20/2019, noncontrast head CT and CT angiogram head/neck 07/19/2019 FINDINGS: Brain: Mildly motion degraded examination. There is residual circulating contrast. There has been continued further demarcation of an acute ischemic infarct centered within the anterolateral left frontal lobe with involvement of the left frontal operculum as well as left insula and subinsular region. There may be subtle petechial hemorrhage in the infarction territory. There is no significant mass effect at this time. No midline shift. Small to moderate volume acute subarachnoid hemorrhage within the left sylvian fissure and extending to the basal cisterns does not appear significantly changed as compared postprocedural head CT 07/19/2019. No evidence of hydrocephalus. Vascular: Circulating contrast material limits evaluation for abnormal vascular hyperdensity. Skull: Normal. Negative for fracture or focal lesion. Sinuses/Orbits: Visualized orbits demonstrate no acute abnormality. Mild right maxillary sinus mucosal thickening. Redemonstrated left middle ear/mastoid effusion. IMPRESSION: Continued further demarcation of an acute ischemic infarct centered within the anterolateral left frontal lobe with involvement of the left frontal operculum as well as left insula and subinsular region. There may be mild petechial hemorrhage in the infarction territory. No significant mass effect at this time. Small to moderate volume acute subarachnoid hemorrhage within the left sylvian fissure and extending to the basal cisterns, not significantly changed from postprocedural head CT 07/19/2019. No evidence of hydrocephalus. Electronically Signed   By: Kellie Simmering DO   On: 07/20/2019 09:10   CT Code Stroke CTA Neck W/WO contrast  Result Date: 07/19/2019 CLINICAL DATA:  Initial evaluation for acute right-sided weakness. EXAM: CT ANGIOGRAPHY HEAD AND NECK TECHNIQUE: Multidetector CT imaging of the head and neck was performed  using the standard protocol during bolus administration of intravenous contrast. Multiplanar CT image reconstructions and MIPs were obtained to evaluate the vascular anatomy. Carotid stenosis measurements (when applicable) are obtained utilizing NASCET criteria, using the distal internal carotid diameter as the denominator. CONTRAST:  43m OMNIPAQUE IOHEXOL  350 MG/ML SOLN COMPARISON:  Prior head CT from earlier same day. FINDINGS: CTA NECK FINDINGS Aortic arch: Visualized aortic arch normal in caliber with normal branch pattern. Bovine branching pattern with common origin of the right brachiocephalic and left common carotid artery noted. No hemodynamically significant stenosis seen about the origin of the great vessels. Subclavian arteries widely patent. Right carotid system: Right common and internal carotid arteries widely patent to the skull base without stenosis, dissection or occlusion. Proximal right ICA medialized into the retropharyngeal space. Focal tortuosity the distal cervical right ICA noted. Left carotid system: Left common and internal carotid arteries widely patent to the skull base without stenosis, dissection or occlusion. Proximal left ICA medialized into the retropharyngeal space. Evaluation the distal cervical left ICA limited by motion. Vertebral arteries: Both vertebral arteries arise from the subclavian arteries. Vertebral arteries mildly tortuous but widely patent within the neck without stenosis, dissection or occlusion. Skeleton: No acute osseous abnormality. No discrete lytic or blastic osseous lesions. Prominent degenerative changes noted at the C1-2 articulation, greater on the right. Other neck: No other acute soft tissue abnormality within the neck. No mass lesion or adenopathy. Upper chest: Mild scattered atelectatic changes noted within the visualized lungs. Visualized upper chest demonstrates no acute finding. Review of the MIP images confirms the above findings CTA HEAD FINDINGS  Anterior circulation: Examination mildly degraded by motion artifact. Petrous, cavernous, and supraclinoid ICAs widely patent without flow-limiting stenosis. ICA termini well perfused. A1 segments patent bilaterally. Normal anterior communicating artery complex. Anterior cerebral arteries widely patent to their distal aspects without stenosis. Right M1 widely patent. Normal right MCA bifurcation. Distal right MCA branches well perfused. Left M1 widely patent. Normal left MCA bifurcation. Occlusive thrombus seen within a proximal left M2 branch, superior division (series 7, image 94), acute in nature. Attenuated opacification of left MCA branches distally, either due to subocclusive thrombus and/or moderate collateralization. Remainder of the left MCA branches remain patent. Posterior circulation: Vertebral arteries grossly patent to the vertebrobasilar junction without appreciable stenosis, although evaluation limited by motion. Neither PICA well visualized. Basilar diminutive but patent to its distal aspect without appreciable stenosis. Superior cerebral arteries patent bilaterally. Predominant fetal type origin of both PCAs, supplied via widely patent and robust posterior communicating arteries. Both PCAs well perfused to their distal aspects. Venous sinuses: Grossly patent allowing for timing the contrast bolus. Anatomic variants: Fetal type origin of the PCAs with overall diminutive vertebrobasilar system. No appreciable aneurysm. Review of the MIP images confirms the above findings IMPRESSION: 1. Positive CTA for emergent large vessel occlusion, with occlusive thrombus within a proximal left M2 branch, superior division. Attenuated flow distally either due to subocclusive thrombus and/or collateralization. 2. Diffuse tortuosity about the major arterial vasculature of the head and neck, suggesting chronic underlying hypertension. No other hemodynamically significant or correctable stenosis. 3. Fetal type origin  of the PCAs with overall diminutive vertebrobasilar system. Critical Value/emergent results were called by telephone at the time of interpretation on 07/19/2019 at 11:26 pm to provider ERIC [facility] , who verbally acknowledged these results. Electronically Signed   By: Jeannine Boga M.D.   On: 07/19/2019 23:53   IR CT Head Ltd  Result Date: 07/20/2019 INDICATION: 74 year old female with past medical history significant for hypothyroidism presenting as a code stroke with right-sided hemiplegia and new onset atrial fibrillation. Her last known well was 21:50 on 07/19/2019. Head CT showed loss of gray-white differentiation in the left frontal for colon extending to the anterior aspect of the  insula with no hemorrhage. CT angiogram showed a left M2/MCA/superior division branch occlusion. NIHSS 25. EXAM: Diagnostic cerebral angiogram Mechanical thrombectomy Flat panel head CT COMPARISON:  CT/CT angiogram of the head and neck July 19, 2019 MEDICATIONS: 5 mg of problem you have intra arterial to the left ICA. ANESTHESIA/SEDATION: General anesthesia CONTRAST:  75 mL FLUOROSCOPY TIME:  Fluoroscopy Time: 55 minutes 18 seconds (1,796 mGy). COMPLICATIONS: Moderate left sylvian subarachnoid hemorrhage TECHNIQUE: Informed written consent was obtained from the patient's son after a thorough discussion of the procedural risks, benefits and alternatives. All questions were addressed. Maximal Sterile Barrier Technique was utilized including caps, mask, sterile gowns, sterile gloves, sterile drape, hand hygiene and skin antiseptic. A timeout was performed prior to the initiation of the procedure. Using a micropuncture kit and modified Seldinger technique, access was gained to the right common femoral artery and an 8 French sheath was placed in the right common femoral artery. Under fluoroscopy, an 8 [place] Walrus balloon guide catheter was navigated over a 6 range Berenstein 2 catheter and a 0.035 inch Terumo Glidewire  into the aortic arch. Under fluoroscopy, the catheter was advanced into the cervical right internal carotid artery. The wire and 6 French catheter were removed and angiograms of the head were obtained in frontal and lateral views. FINDINGS: There is a left M2/MCA superior division branch occlusion. Prominent tortuosity of the cervical left ICA and left carotid siphon. PROCEDURE: A large bore catheter aspiration catheter was navigated through the balloon guide catheter and over a phenom 21 microcatheter and a synchro2 support microguidewire into the cavernous segment of the left ICA. The microcatheter was then advanced into the left M2/MCA superior division branch, distal to the point of occlusion. Frontal and lateral angiograms were obtained with microcatheter contrast injection. A 4 x 40 mm solitaire stent retriever was subsequently deployed spanning the distal left M1-mid M2. The device was allowed to intercalated with the clot for 4 minutes. The microcatheter was removed. The guiding catheter balloon was inflated and constant aspiration was performed, the aspiration catheter was navigated near the occlusion and connected to a penumbra aspiration pump. The stent retriever was then retracted along with the aspiration catheter. Frontal and lateral angiograms were obtained showing persistent left M2/MCA occlusion. In a similar fashion and using the same platform, a second stent retriever pass was performed with the solitaire device. Frontal and lateral angiograms were obtained showing persistent left M2/MCA occlusion with the clot migrating proximally. A catalyst 6 aspiration catheter was navigated through the balloon guide catheter and over a phenom 21 microcatheter and posterior 0.014 inch Aristotle microguidewire into the cavernous segment of the left ICA. The microcatheter was then advanced into the left M2/MCA superior division branch, distal to the point of occlusion. Frontal and lateral angiograms were  obtained with microcatheter contrast injection. A 4 x 41 mm trevo stent retriever was subsequently deployed spanning the distal left M1-mid M2. The device was allowed to intercalated with the clot for 4 minutes. The microcatheter was removed. The guiding catheter balloon was inflated and constant aspiration was performed, the aspiration catheter was navigated near the occlusion and connected to a penumbra aspiration pump. The stent retriever was then retracted along with the aspiration catheter. Frontal and lateral angiograms were obtained showing distal contrast penetration into the left M2/MCA superior division branch with severe vasospasm. Intra arterial infusion of 5 mg of verapamil was performed over 10 minutes. Follow-up angiogram showed reocclusion of the left M2/MCA superior division branch. A 3 max aspiration  catheter was navigated through the balloon guide catheter and over the Aristotle microguidewire into the left and 2/MCA superior division branch at the level of occlusion. Continuous is aspiration was performed for 4 minutes. The guiding catheter balloon was inflated and the aspiration catheter was then removed. Follow-up left ICA angiograms with frontal and lateral views showed recanalization of the left M2/MCA superior division branch with slow flow in the distal angular branch (TICI 2C). A flat panel CT of the head was then obtained. Moderate left sylvian subarachnoid hemorrhage was identified. The catheter was then removed. A right common femoral artery angiogram was obtained via sheath side port. A 6 French Perclose ProGlide was utilized for right, femoral access closure. Immediate hemostasis was achieved. Patient was transferred to ICU for continued monitoring. Family and neurology team communicated immediately after the end of the procedure. IMPRESSION: 1. Left M2/MCA superior division branch occlusion. 2. Successful mechanical thrombectomy/aspiration performed with TICI 2C final recanalization.  A total of 3 stent retriever passes and 1 aspiration pass performed. 3. Moderate size left sylvian subarachnoid hemorrhage seen on postprocedure flat panel CT. PLAN: Continued monitoring in ICU level of care. Repeat head CT within 4 hours to evaluate for subarachnoid hemorrhage stability. Electronically Signed   By: Pedro Earls M.D.   On: 07/20/2019 09:27   IR PERCUTANEOUS ART THROMBECTOMY/INFUSION INTRACRANIAL INC DIAG ANGIO  Result Date: 07/20/2019 INDICATION: 74 year old female with past medical history significant for hypothyroidism presenting as a code stroke with right-sided hemiplegia and new onset atrial fibrillation. Her last known well was 21:50 on 07/19/2019. Head CT showed loss of gray-white differentiation in the left frontal for colon extending to the anterior aspect of the insula with no hemorrhage. CT angiogram showed a left M2/MCA/superior division branch occlusion. NIHSS 25. EXAM: Diagnostic cerebral angiogram Mechanical thrombectomy Flat panel head CT COMPARISON:  CT/CT angiogram of the head and neck July 19, 2019 MEDICATIONS: 5 mg of problem you have intra arterial to the left ICA. ANESTHESIA/SEDATION: General anesthesia CONTRAST:  75 mL FLUOROSCOPY TIME:  Fluoroscopy Time: 55 minutes 18 seconds (1,796 mGy). COMPLICATIONS: Moderate left sylvian subarachnoid hemorrhage TECHNIQUE: Informed written consent was obtained from the patient's son after a thorough discussion of the procedural risks, benefits and alternatives. All questions were addressed. Maximal Sterile Barrier Technique was utilized including caps, mask, sterile gowns, sterile gloves, sterile drape, hand hygiene and skin antiseptic. A timeout was performed prior to the initiation of the procedure. Using a micropuncture kit and modified Seldinger technique, access was gained to the right common femoral artery and an 8 French sheath was placed in the right common femoral artery. Under fluoroscopy, an 8 [place]  Walrus balloon guide catheter was navigated over a 6 range Berenstein 2 catheter and a 0.035 inch Terumo Glidewire into the aortic arch. Under fluoroscopy, the catheter was advanced into the cervical right internal carotid artery. The wire and 6 French catheter were removed and angiograms of the head were obtained in frontal and lateral views. FINDINGS: There is a left M2/MCA superior division branch occlusion. Prominent tortuosity of the cervical left ICA and left carotid siphon. PROCEDURE: A large bore catheter aspiration catheter was navigated through the balloon guide catheter and over a phenom 21 microcatheter and a synchro2 support microguidewire into the cavernous segment of the left ICA. The microcatheter was then advanced into the left M2/MCA superior division branch, distal to the point of occlusion. Frontal and lateral angiograms were obtained with microcatheter contrast injection. A 4 x 40 mm solitaire  stent retriever was subsequently deployed spanning the distal left M1-mid M2. The device was allowed to intercalated with the clot for 4 minutes. The microcatheter was removed. The guiding catheter balloon was inflated and constant aspiration was performed, the aspiration catheter was navigated near the occlusion and connected to a penumbra aspiration pump. The stent retriever was then retracted along with the aspiration catheter. Frontal and lateral angiograms were obtained showing persistent left M2/MCA occlusion. In a similar fashion and using the same platform, a second stent retriever pass was performed with the solitaire device. Frontal and lateral angiograms were obtained showing persistent left M2/MCA occlusion with the clot migrating proximally. A catalyst 6 aspiration catheter was navigated through the balloon guide catheter and over a phenom 21 microcatheter and posterior 0.014 inch Aristotle microguidewire into the cavernous segment of the left ICA. The microcatheter was then advanced into the  left M2/MCA superior division branch, distal to the point of occlusion. Frontal and lateral angiograms were obtained with microcatheter contrast injection. A 4 x 41 mm trevo stent retriever was subsequently deployed spanning the distal left M1-mid M2. The device was allowed to intercalated with the clot for 4 minutes. The microcatheter was removed. The guiding catheter balloon was inflated and constant aspiration was performed, the aspiration catheter was navigated near the occlusion and connected to a penumbra aspiration pump. The stent retriever was then retracted along with the aspiration catheter. Frontal and lateral angiograms were obtained showing distal contrast penetration into the left M2/MCA superior division branch with severe vasospasm. Intra arterial infusion of 5 mg of verapamil was performed over 10 minutes. Follow-up angiogram showed reocclusion of the left M2/MCA superior division branch. A 3 max aspiration catheter was navigated through the balloon guide catheter and over the Aristotle microguidewire into the left and 2/MCA superior division branch at the level of occlusion. Continuous is aspiration was performed for 4 minutes. The guiding catheter balloon was inflated and the aspiration catheter was then removed. Follow-up left ICA angiograms with frontal and lateral views showed recanalization of the left M2/MCA superior division branch with slow flow in the distal angular branch (TICI 2C). A flat panel CT of the head was then obtained. Moderate left sylvian subarachnoid hemorrhage was identified. The catheter was then removed. A right common femoral artery angiogram was obtained via sheath side port. A 6 French Perclose ProGlide was utilized for right, femoral access closure. Immediate hemostasis was achieved. Patient was transferred to ICU for continued monitoring. Family and neurology team communicated immediately after the end of the procedure. IMPRESSION: 1. Left M2/MCA superior division  branch occlusion. 2. Successful mechanical thrombectomy/aspiration performed with TICI 2C final recanalization. A total of 3 stent retriever passes and 1 aspiration pass performed. 3. Moderate size left sylvian subarachnoid hemorrhage seen on postprocedure flat panel CT. PLAN: Continued monitoring in ICU level of care. Repeat head CT within 4 hours to evaluate for subarachnoid hemorrhage stability. Electronically Signed   By: Pedro Earls M.D.   On: 07/20/2019 09:27   CT HEAD CODE STROKE WO CONTRAST  Result Date: 07/19/2019 CLINICAL DATA:  Code stroke. Initial evaluation for right-sided facial droop with right-sided weakness. EXAM: CT HEAD WITHOUT CONTRAST TECHNIQUE: Contiguous axial images were obtained from the base of the skull through the vertex without intravenous contrast. COMPARISON:  None. FINDINGS: Brain: Mild age-related cerebral atrophy. Small focus of encephalomalacia involving the cortical/subcortical high left frontal lobe consistent with a small chronic left MCA territory infarct (series 3, image 25). Few scattered parenchymal  calcifications noted at the right frontal and left parietal region. There is subtle loss of gray-white matter differentiation involving the region of the left frontal operculum, suspicious for early/developing acute left MCA territory infarct. Left insula relatively well maintained at this time as are the deep gray nuclei. No intracranial hemorrhage or mass effect. No other acute large vessel territory infarct. No mass lesion or midline shift. No hydrocephalus. No extra-axial fluid collection. Vascular: Asymmetric hyperdensity seen M2 branch (series 6, image 45), suspicious for LV of. Of distal left M1 and/or proximal Skull: Scalp soft tissues and calvarium within normal limits. Sinuses/Orbits: Left gaze noted. Globes and orbital soft tissues otherwise unremarkable. Mild mucosal thickening noted within the right sphenoid sinus. Chronic appearing left mastoid  and middle ear effusion noted. Visualized nasopharynx within normal limits. Other: None. ASPECTS [facility] Stroke Program Early CT Score) - Ganglionic level infarction (caudate, lentiform nuclei, internal capsule, insula, M1-M3 cortex): 7 - Supraganglionic infarction (M4-M6 cortex): 2 Total score (0-10 with 10 being normal): 9 IMPRESSION: 1. Subtle loss of gray-white matter differentiation involving the left frontal operculum, concerning for acute left MCA territory infarct. Hyperdensity at the level of a distal left M1/proximal M2 branch concerning for LVO. Further assessment with dedicated CTA recommended. No intracranial hemorrhage. 2. ASPECTS is 9. 3. Small chronic high left frontal infarct. Critical Value/emergent results were called by telephone at the time of interpretation on 07/19/2019 at 11:10 pm to provider ERIC [facility] , who verbally acknowledged these results. Electronically Signed   By: Jeannine Boga M.D.   On: 07/19/2019 23:35    EKG:07/18/2018 showed AF with RVR at 156 bpm (personally reviewed)  TELEMETRY: NSR/Sinus brady in 50-60s. At times, in AF with rates in 100-140s with post conversion pauses ranging from 2-4 seconds, one > 5 seconds (personally reviewed)  Assessment/Plan: 1. Paroxysmal Atrial fibrillation with post conversion pauses Only recently moved from [place] so no real history. Pt son via phone denies known history of arrhythmias of anticoagulants. Possible new diagnosis in the setting of acute illness/stroke.  Unfortunately, she has had SAH after TPA so cannot use OAC at this time.  Will eventually need [place] for CHA2DS2VASC of at least 6    Echo with normal EF and mild MR. Normal atrial sizes.  Discussed with Dr. Lovena Le.   At this time, would not intervene. She is hemodynamically stable and not ambulatory, so worry less about injury from fall. She may become more bradycardic have worsening pauses in the setting of increased intracranial pressure.  We will continue to  monitor her as she improves.   2. CVA and SAH s/p TPA Holding [place] per neuro She remains intubated at this time Plan repeat MRI this evening to evaluate for any changes.   3. HTN Follow. BP goals per neuro at this time.     For questions or updates, please contact [place] Please consult [email or web address] for contact info under Cardiology/STEMI.  Jacalyn Lefevre, PA-C  07/20/2019 10:42 AM  EP Attending  Patient seen and examined. Agree with the findings as noted above. The patient has sustained a stroke and was intubated and found to have atrial fib/flutter and a RVR followed at times by post termination pauses. She has remained intubated. Unclear what her deficits will utimately be. For now I would not recommend insertion of a temp PM. If she recovers and it looks like she will become functional she will need a PPM and AA drug therapy. If she remains with severe difficits then she  may be better of on oral amiodarone.   Mikle Bosworth.D.

## 2019-07-20 NOTE — Progress Notes (Signed)
Called by Dr. Katherina Right Rodriques. TICI-2c revascularization has been achieved. There was complication of subarachnoid hemorrhage. Repeat CT is being ordered for 6:30 AM (4 hours post-procedure) to assess for stability.   Electronically signed: Dr. Kerney Elbe

## 2019-07-20 NOTE — H&P (Signed)
NAME:  Jamie Mitchell, MRN:  [id number], DOB:  1945-08-02, LOS: 0 ADMISSION DATE:  07/19/2019, CONSULTATION DATE:  07/19/2018 REFERRING MD:  Neurology, CHIEF COMPLAINT:  Ventilator and medical management   Brief History    74 yo F with a history of hypothyroidism on synthroid who presented with a stroke with right sided weakness and taken to IR for thrombectomy of left M2/MCA occlusion  PCCM consulted for ventilator management post thrombectomy.   History of present illness   HPI unable to obtain from patient due to intubated status.  From chart review, patient was last known normal at 2150, found 2215 by family with acute right sided weakness, left gaze, dysarthria.  History of Afib in the remote past, no history of strokes.  She presented to the ED as a CODE STROKE and found to have no hemorrhage on CT head, and tPA was then initiated.  During tPA infusion, CTA head was done showing M2 occlusion.  She was taken for thrombectomy and revascularization, which was complicated by subarachnoid hemorrhage (moderate left sylvian SAH).  She was kept intubated and sedated on propofol and sent to neuro ICU.  On arrival to the ICU, patient was in afib and intermittent bradycardic with HR in the 50s.  She was mildly hypotensive with MAPs ~70.  She was on high dose propofol.      Past Medical History  Hypothyroidism HTN Paroxysmal Afib  Significant Hospital Events   tPA 2/24, 2317 L M2/MCA thrombectomy 2/24   Consults:  PCCM  Procedures:  L M2/MCA thrombectomy 2/24  Significant Diagnostic Tests:  CTA head and neck: IMPRESSION: 1. Positive CTA for emergent large vessel occlusion, with occlusive thrombus within a proximal left M2 branch, superior division. Attenuated flow distally either due to subocclusive thrombus and/or collateralization. 2. Diffuse tortuosity about the major arterial vasculature of the head and neck, suggesting chronic underlying hypertension. No  other hemodynamically significant or correctable stenosis. 3. Fetal type origin of the PCAs with overall diminutive vertebrobasilar system.  Micro Data:  Sars Cov2 negative Flu A negative Flu B negative  Antimicrobials:  none  Interim history/subjective:    Objective   Blood pressure 115/81, pulse 71, temperature (!) 97.4 F (36.3 C), temperature source Oral, resp. rate 15, height 5\' 2"  (1.575 m), weight 68.8 kg, SpO2 (!) 84 %.    Vent Mode: PRVC FiO2 (%):  [50 %] 50 % Set Rate:  [15 bmp] 15 bmp Vt Set:  [400 mL] 400 mL PEEP:  [5 cmH20] 5 cmH20 Plateau Pressure:  [21 cmH20] 21 cmH20   Intake/Output Summary (Last 24 hours) at 07/20/2019 0333 Last data filed at 07/20/2019 0249 Gross per 24 hour  Intake 1100 ml  Output 25 ml  Net 1075 ml   Filed Weights   07/19/19 2200 07/19/19 2346  Weight: 68.8 kg 68.8 kg    Examination: General: intubated, sedated.  HENT: ETT in place Lungs: bilateral rhonchi Cardiovascular: irregular, occasional pauses Abdomen: benign Extremities: no edema Neuro:  Left pupil briskly reactive, non reactive right pupil.  Non purposeful movement of left arm GU: pure wick in place  Resolved Hospital Problem list     Assessment & Plan:  74 yo F with a history of Afib, hypothyroidism, HTN who presented with L MCA/M2 stroke s/p tPA and thrombectomy, PCCM consulted for ventilator and hemodynamic management.  Issues with bradycardia and sinus pauses.    # L MCA stroke, M2:  Thrombectomy and s/p TPA.  Complicated by [facility].   -  follow up [place] this am - atorvastatin - hold anti platelets and anticoagulation - phenylephrine to maintain higher MAPs with recent stroke  # Atrial arrhythmia: periodically in slow Afib, then bradycardic with a sinus pause, then sinus brady-->Aflutter-->Afib.  - hold sedative meds, can use prn low dose fentanyl as needed for agitation - TSH, check.  For now continue synthroid dosing - warm up body temp (at 94 degrees) -  formal TTE - magnesium 2g now, in case has underlying prolonged Qtc - recommend cardiology consult - atropine at bedside  # Respiratory insufficiency: left intubated after thrombectomy for airway protection.  Bilateral rhonchi on exam, pronounced expiratory rhonchi - PRVC tidal volume 6-8cc/kg IBW - ABG now - give duonebs for expiratory wheezes/rhonchi - CXR   Best practice:  Diet: NPO Pain/Anxiety/Delirium protocol (if indicated): prn fentanyl VAP protocol (if indicated): yes DVT prophylaxis: holding, just got TPA GI prophylaxis: ppi Glucose control: SSI q4H Mobility: PT when able Code Status: Full Family Communication: have not talked with family tonight Disposition: neuro ICU  Labs   CBC: Recent Labs  Lab 07/19/19 2300 07/19/19 2305  WBC 6.9  --   NEUTROABS 2.7  --   HGB 14.8 15.0  HCT 45.4 44.0  MCV 92.5  --   PLT 256  --     Basic Metabolic Panel: Recent Labs  Lab 07/19/19 2300 07/19/19 2305  NA 140 140  K 4.3 4.2  CL 107 105  CO2 24  --   GLUCOSE 105* 101*  BUN 18 19  CREATININE 0.92 0.90  CALCIUM 9.2  --    GFR: Estimated Creatinine Clearance: 52.1 mL/min (by C-G formula based on SCr of 0.9 mg/dL). Recent Labs  Lab 07/19/19 2300  WBC 6.9    Liver Function Tests: Recent Labs  Lab 07/19/19 2300  AST 22  ALT 15  ALKPHOS 106  BILITOT 0.9  PROT 7.8  ALBUMIN 4.0   No results for input(s): LIPASE, AMYLASE in the last 168 hours. No results for input(s): AMMONIA in the last 168 hours.  ABG    Component Value Date/Time   TCO2 26 07/19/2019 2305     Coagulation Profile: Recent Labs  Lab 07/19/19 2300  INR 1.0    Cardiac Enzymes: No results for input(s): CKTOTAL, CKMB, CKMBINDEX, TROPONINI in the last 168 hours.  HbA1C: Hemoglobin A1C  Date/Time Value Ref Range Status  07/31/2015 03:15 PM 6.3  Final   Hgb A1c MFr Bld  Date/Time Value Ref Range Status  05/01/2014 09:14 AM 6.4 (H) <5.7 % Final    Comment:                                                                            According to the ADA Clinical Practice Recommendations for 2011, when HbA1c is used as a screening test:     >=6.5%   Diagnostic of Diabetes Mellitus            (if abnormal result is confirmed)   5.7-6.4%   Increased risk of developing Diabetes Mellitus   References:Diagnosis and Classification of Diabetes Mellitus,Diabetes [id number] 1):S62-S69 and Standards of Medical Care in         Diabetes - 2011,Diabetes [id number] (Suppl  1):S11-S61.       CBG: Recent Labs  Lab 07/19/19 2257  GLUCAP 102*    Review of Systems:   Unable to obtain due to intubated status  Past Medical History  She,  has a past medical history of Hypertension and Thyroid disease.   Surgical History   History reviewed. No pertinent surgical history.   Social History   reports that she has never smoked. She has never used smokeless tobacco. She reports that she does not drink alcohol or use drugs.   Family History   Her family history is not on file.   Allergies No Known Allergies   Home Medications  Prior to Admission medications   Medication Sig Start Date End Date Taking? Authorizing Provider  aspirin 81 MG tablet Take 1 tablet (81 mg total) by mouth daily. Reported on 07/31/2015 02/26/17   Alfonse Spruce, FNP  atorvastatin (LIPITOR) 40 MG tablet Take 1 tablet (40 mg total) by mouth daily. 03/04/17   Alfonse Spruce, FNP  hydrochlorothiazide (HYDRODIURIL) 25 MG tablet Take 1 tablet (25 mg total) by mouth daily. 02/26/17   Alfonse Spruce, FNP  levothyroxine (SYNTHROID, LEVOTHROID) 75 MCG tablet Take 1 tablet (75 mcg total) by mouth daily. 03/04/17   Alfonse Spruce, FNP  naproxen (NAPROSYN) 500 MG tablet Take 1 tablet (500 mg total) by mouth every 12 (twelve) hours as needed for moderate pain (Take with food.). 02/26/17   Alfonse Spruce, FNP  traMADol (ULTRAM) 50 MG tablet Take 1 tablet (50 mg total) by mouth  every 12 (twelve) hours as needed for severe pain (Take with food). 02/26/17   Alfonse Spruce, FNP     Critical care time: 48 min

## 2019-07-20 NOTE — Progress Notes (Signed)
Attempted to see pt today. Pt not medically ready.  On bedrest and intubated. Nurse stated to attempt another day. Jinger Neighbors, [place] [phone]

## 2019-07-20 NOTE — Progress Notes (Addendum)
PCCM Interval Note  Admitted early this morning for acute left M2/ MCA occlusion with right sided weakness s/p tPA and emergent thrombectomy which was complicated by moderate left sylvian SAH.    Since, patient went for repeat CTH, SAH not significantly changed.  Additionally now having intermittent pauses (up to 5-6 sec) with aflutter/ fib with rates into 140's.  Cardiology consulted and awaiting EP consult.    Patient has since vomited x 2 of coffee ground emesis.  Unable to place OGT given she is s/p tPA.  No significant desaturation or increased vent support, doubtful for aspiration event but is possible.   Remains on fentanyl gtt, propofol d/c given significant hypotension, since off neosynephrine   P: To remain intubated today and SBT as tolerated, but no extubation given unclear plans pending EP consult.  PPI BID Continue fentanyl gtt, add prn versed given hypotension with propofol  CXR in am     Kennieth Rad, MSN, AGACNP-BC [facility] 07/20/2019, 10:28 AM

## 2019-07-20 NOTE — Progress Notes (Signed)
PCCM Brief Progress Note  74 yo F with M2 occlusion s/p tpa, complicated by [facility].   Paged to bedside for HR >140. Over course of day, patient has exhibited AF rvr, with post-conversion subsequent pauses, ST, and NSR. Cards EP was consulted and recommended ongoing monitoring.   During bedside assessment, patient exhibited what appeared to be AFRVR, ST, AFRVR + conversion pauses, NSR, AFRVR. Patient is moving spontaneously, intubated and lightly sedated in NAD.  P -Ordering 1g mag empirically -20:00 BMP, mag, ionized calcium -difficult to intervene with medication due to frequency of fluctuation. Apt to agree with EP and monitor closely, with optimization of electrolytes    Additional Critical Care Time: 62minutes  Eliseo Gum MSN, AGACNP-BC [place] [phone] If no answer, [phone] 07/20/2019, 6:35 PM

## 2019-07-20 NOTE — Progress Notes (Signed)
Has been having fluctuating HR in the context of atrial fibrillation.   Will consult Cardiology.   Repeat CT head is pending to assess for stability of subarachnoid hemorrhage noted on post-VIR CT.   Electronically signed: Dr. Kerney Elbe

## 2019-07-20 NOTE — Progress Notes (Addendum)
Referring Physician(s): CODE STROKE  Supervising Physician: Dr. Pedro Earls  Patient Status:  [facility] - In-pt  Chief Complaint: Follow up CODE STROKE 07/20/19 s/p tPA and mechanical thrombectomy in NIR  Subjective:  Patient remains intubated/ventilated due to vomiting of coffee ground emesis x 2 this morning. She remains on fentanyl gtt and neosynephrine, propofol has been d/ced. Patient seen with Dr. Karenann Cai, patient's son [name] is at bedside today. RRT at bedside adjusting ETT during exam.  Per chart review patient with persistent hypotension, tachycardia and concern for paroxysmal a.flutter/a.fib, possible tachy-brady syndrome which is being followed by cardiology/EP.    Allergies: Patient has no known allergies.  Medications: Prior to Admission medications   Medication Sig Start Date End Date Taking? Authorizing Provider  amLODipine (NORVASC) 5 MG tablet Take 5 mg by mouth daily.   Yes [provider]  ASPIRIN PO Take 150 mg by mouth at bedtime.   Yes [provider]  atorvastatin (LIPITOR) 20 MG tablet Take 20 mg by mouth daily.   Yes [provider]  levothyroxine (SYNTHROID) 100 MCG tablet Take 50-75 mcg by mouth See admin instructions. 10mg one day, 757m the next day, then repeat.   Yes [provider]  pentoxifylline (TRENTAL) 400 MG CR tablet Take 400 mg by mouth daily.   Yes [provider]     Vital Signs: BP 124/68   Pulse 81   Temp 98.3 F (36.8 C) (Axillary)   Resp 16   Ht _0  (1.575 m)   Wt 151 lb 10.8 oz (68.8 kg)   SpO2 97%   BMI 27.74 kg/m   Physical Exam Vitals and nursing note reviewed.  Constitutional:      Comments: (+) intubated/ventilated  HENT:     Head: Normocephalic.  Cardiovascular:     Rate and Rhythm: Normal rate.     Comments: (+) right CFA puncture site clean, dry, dressed appropriately without active bleeding. Soft to palpation.  Pulmonary:     Comments:  (+) ventilated Skin:    General: Skin is warm and dry.  Neurological:     Comments: Patient grimaces, moving left upper and lower extremity while ETT being manipulated by RRT. No appreciable movement with palpation of groin.       Imaging: CT Code Stroke CTA Head W/WO contrast  Result Date: 07/19/2019 CLINICAL DATA:  Initial evaluation for acute right-sided weakness. EXAM: CT ANGIOGRAPHY HEAD AND NECK TECHNIQUE: Multidetector CT imaging of the head and neck was performed using the standard protocol during bolus administration of intravenous contrast. Multiplanar CT image reconstructions and MIPs were obtained to evaluate the vascular anatomy. Carotid stenosis measurements (when applicable) are obtained utilizing NASCET criteria, using the distal internal carotid diameter as the denominator. CONTRAST:  8051mMNIPAQUE IOHEXOL 350 MG/ML SOLN COMPARISON:  Prior head CT from earlier same day. FINDINGS: CTA NECK FINDINGS Aortic arch: Visualized aortic arch normal in caliber with normal branch pattern. Bovine branching pattern with common origin of the right brachiocephalic and left common carotid artery noted. No hemodynamically significant stenosis seen about the origin of the great vessels. Subclavian arteries widely patent. Right carotid system: Right common and internal carotid arteries widely patent to the skull base without stenosis, dissection or occlusion. Proximal right ICA medialized into the retropharyngeal space. Focal tortuosity the distal cervical right ICA noted. Left carotid system: Left common and internal carotid arteries widely patent to the skull base without stenosis, dissection or occlusion. Proximal left ICA medialized  into the retropharyngeal space. Evaluation the distal cervical left ICA limited by motion. Vertebral arteries: Both vertebral arteries arise from the subclavian arteries. Vertebral arteries mildly tortuous but widely patent within the neck without stenosis, dissection or  occlusion. Skeleton: No acute osseous abnormality. No discrete lytic or blastic osseous lesions. Prominent degenerative changes noted at the C1-2 articulation, greater on the right. Other neck: No other acute soft tissue abnormality within the neck. No mass lesion or adenopathy. Upper chest: Mild scattered atelectatic changes noted within the visualized lungs. Visualized upper chest demonstrates no acute finding. Review of the MIP images confirms the above findings CTA HEAD FINDINGS Anterior circulation: Examination mildly degraded by motion artifact. Petrous, cavernous, and supraclinoid ICAs widely patent without flow-limiting stenosis. ICA termini well perfused. A1 segments patent bilaterally. Normal anterior communicating artery complex. Anterior cerebral arteries widely patent to their distal aspects without stenosis. Right M1 widely patent. Normal right MCA bifurcation. Distal right MCA branches well perfused. Left M1 widely patent. Normal left MCA bifurcation. Occlusive thrombus seen within a proximal left M2 branch, superior division (series 7, image 94), acute in nature. Attenuated opacification of left MCA branches distally, either due to subocclusive thrombus and/or moderate collateralization. Remainder of the left MCA branches remain patent. Posterior circulation: Vertebral arteries grossly patent to the vertebrobasilar junction without appreciable stenosis, although evaluation limited by motion. Neither PICA well visualized. Basilar diminutive but patent to its distal aspect without appreciable stenosis. Superior cerebral arteries patent bilaterally. Predominant fetal type origin of both PCAs, supplied via widely patent and robust posterior communicating arteries. Both PCAs well perfused to their distal aspects. Venous sinuses: Grossly patent allowing for timing the contrast bolus. Anatomic variants: Fetal type origin of the PCAs with overall diminutive vertebrobasilar system. No appreciable aneurysm.  Review of the MIP images confirms the above findings IMPRESSION: 1. Positive CTA for emergent large vessel occlusion, with occlusive thrombus within a proximal left M2 branch, superior division. Attenuated flow distally either due to subocclusive thrombus and/or collateralization. 2. Diffuse tortuosity about the major arterial vasculature of the head and neck, suggesting chronic underlying hypertension. No other hemodynamically significant or correctable stenosis. 3. Fetal type origin of the PCAs with overall diminutive vertebrobasilar system. Critical Value/emergent results were called by telephone at the time of interpretation on 07/19/2019 at 11:26 pm to provider ERIC [facility] , who verbally acknowledged these results. Electronically Signed   By: Jeannine Boga M.D.   On: 07/19/2019 23:53   DG Chest 1 View  Result Date: 07/20/2019 CLINICAL DATA:  Intubation EXAM: CHEST  1 VIEW COMPARISON:  None. FINDINGS: Endotracheal tube tip is positioned within the proximal right mainstem bronchus. Heart size is mildly enlarged. No focal airspace consolidation. No pleural effusion or pneumothorax. IMPRESSION: 1. Endotracheal tube tip within the proximal right mainstem bronchus. Recommend retraction approximately 3.0 cm. 2. Mild cardiomegaly. These results will be called to the ordering clinician or representative by the Radiologist Assistant, and communication documented in the PACS or zVision Dashboard. Electronically Signed   By: Davina Poke D.O.   On: 07/20/2019 10:36   CT HEAD WO CONTRAST  Result Date: 07/20/2019 CLINICAL DATA:  Stroke, follow-up. EXAM: CT HEAD WITHOUT CONTRAST TECHNIQUE: Contiguous axial images were obtained from the base of the skull through the vertex without intravenous contrast. COMPARISON:  Postprocedure head CT 07/20/2019, noncontrast head CT and CT angiogram head/neck 07/19/2019 FINDINGS: Brain: Mildly motion degraded examination. There is residual circulating contrast. There has  been continued further demarcation of an acute ischemic infarct centered  within the anterolateral left frontal lobe with involvement of the left frontal operculum as well as left insula and subinsular region. There may be subtle petechial hemorrhage in the infarction territory. There is no significant mass effect at this time. No midline shift. Small to moderate volume acute subarachnoid hemorrhage within the left sylvian fissure and extending to the basal cisterns does not appear significantly changed as compared postprocedural head CT 07/19/2019. No evidence of hydrocephalus. Vascular: Circulating contrast material limits evaluation for abnormal vascular hyperdensity. Skull: Normal. Negative for fracture or focal lesion. Sinuses/Orbits: Visualized orbits demonstrate no acute abnormality. Mild right maxillary sinus mucosal thickening. Redemonstrated left middle ear/mastoid effusion. IMPRESSION: Continued further demarcation of an acute ischemic infarct centered within the anterolateral left frontal lobe with involvement of the left frontal operculum as well as left insula and subinsular region. There may be mild petechial hemorrhage in the infarction territory. No significant mass effect at this time. Small to moderate volume acute subarachnoid hemorrhage within the left sylvian fissure and extending to the basal cisterns, not significantly changed from postprocedural head CT 07/19/2019. No evidence of hydrocephalus. Electronically Signed   By: Kellie Simmering DO   On: 07/20/2019 09:10   CT Code Stroke CTA Neck W/WO contrast  Result Date: 07/19/2019 CLINICAL DATA:  Initial evaluation for acute right-sided weakness. EXAM: CT ANGIOGRAPHY HEAD AND NECK TECHNIQUE: Multidetector CT imaging of the head and neck was performed using the standard protocol during bolus administration of intravenous contrast. Multiplanar CT image reconstructions and MIPs were obtained to evaluate the vascular anatomy. Carotid stenosis  measurements (when applicable) are obtained utilizing NASCET criteria, using the distal internal carotid diameter as the denominator. CONTRAST:  77m OMNIPAQUE IOHEXOL 350 MG/ML SOLN COMPARISON:  Prior head CT from earlier same day. FINDINGS: CTA NECK FINDINGS Aortic arch: Visualized aortic arch normal in caliber with normal branch pattern. Bovine branching pattern with common origin of the right brachiocephalic and left common carotid artery noted. No hemodynamically significant stenosis seen about the origin of the great vessels. Subclavian arteries widely patent. Right carotid system: Right common and internal carotid arteries widely patent to the skull base without stenosis, dissection or occlusion. Proximal right ICA medialized into the retropharyngeal space. Focal tortuosity the distal cervical right ICA noted. Left carotid system: Left common and internal carotid arteries widely patent to the skull base without stenosis, dissection or occlusion. Proximal left ICA medialized into the retropharyngeal space. Evaluation the distal cervical left ICA limited by motion. Vertebral arteries: Both vertebral arteries arise from the subclavian arteries. Vertebral arteries mildly tortuous but widely patent within the neck without stenosis, dissection or occlusion. Skeleton: No acute osseous abnormality. No discrete lytic or blastic osseous lesions. Prominent degenerative changes noted at the C1-2 articulation, greater on the right. Other neck: No other acute soft tissue abnormality within the neck. No mass lesion or adenopathy. Upper chest: Mild scattered atelectatic changes noted within the visualized lungs. Visualized upper chest demonstrates no acute finding. Review of the MIP images confirms the above findings CTA HEAD FINDINGS Anterior circulation: Examination mildly degraded by motion artifact. Petrous, cavernous, and supraclinoid ICAs widely patent without flow-limiting stenosis. ICA termini well perfused. A1  segments patent bilaterally. Normal anterior communicating artery complex. Anterior cerebral arteries widely patent to their distal aspects without stenosis. Right M1 widely patent. Normal right MCA bifurcation. Distal right MCA branches well perfused. Left M1 widely patent. Normal left MCA bifurcation. Occlusive thrombus seen within a proximal left M2 branch, superior division (series 7, image 94), acute  in nature. Attenuated opacification of left MCA branches distally, either due to subocclusive thrombus and/or moderate collateralization. Remainder of the left MCA branches remain patent. Posterior circulation: Vertebral arteries grossly patent to the vertebrobasilar junction without appreciable stenosis, although evaluation limited by motion. Neither PICA well visualized. Basilar diminutive but patent to its distal aspect without appreciable stenosis. Superior cerebral arteries patent bilaterally. Predominant fetal type origin of both PCAs, supplied via widely patent and robust posterior communicating arteries. Both PCAs well perfused to their distal aspects. Venous sinuses: Grossly patent allowing for timing the contrast bolus. Anatomic variants: Fetal type origin of the PCAs with overall diminutive vertebrobasilar system. No appreciable aneurysm. Review of the MIP images confirms the above findings IMPRESSION: 1. Positive CTA for emergent large vessel occlusion, with occlusive thrombus within a proximal left M2 branch, superior division. Attenuated flow distally either due to subocclusive thrombus and/or collateralization. 2. Diffuse tortuosity about the major arterial vasculature of the head and neck, suggesting chronic underlying hypertension. No other hemodynamically significant or correctable stenosis. 3. Fetal type origin of the PCAs with overall diminutive vertebrobasilar system. Critical Value/emergent results were called by telephone at the time of interpretation on 07/19/2019 at 11:26 pm to provider ERIC  [facility] , who verbally acknowledged these results. Electronically Signed   By: Jeannine Boga M.D.   On: 07/19/2019 23:53   IR CT Head Ltd  Result Date: 07/20/2019 INDICATION: 74 year old female with past medical history significant for hypothyroidism presenting as a code stroke with right-sided hemiplegia and new onset atrial fibrillation. Her last known well was 21:50 on 07/19/2019. Head CT showed loss of gray-white differentiation in the left frontal for colon extending to the anterior aspect of the insula with no hemorrhage. CT angiogram showed a left M2/MCA/superior division branch occlusion. NIHSS 25. EXAM: Diagnostic cerebral angiogram Mechanical thrombectomy Flat panel head CT COMPARISON:  CT/CT angiogram of the head and neck July 19, 2019 MEDICATIONS: 5 mg of problem you have intra arterial to the left ICA. ANESTHESIA/SEDATION: General anesthesia CONTRAST:  75 mL FLUOROSCOPY TIME:  Fluoroscopy Time: 55 minutes 18 seconds (1,796 mGy). COMPLICATIONS: Moderate left sylvian subarachnoid hemorrhage TECHNIQUE: Informed written consent was obtained from the patient's son after a thorough discussion of the procedural risks, benefits and alternatives. All questions were addressed. Maximal Sterile Barrier Technique was utilized including caps, mask, sterile gowns, sterile gloves, sterile drape, hand hygiene and skin antiseptic. A timeout was performed prior to the initiation of the procedure. Using a micropuncture kit and modified Seldinger technique, access was gained to the right common femoral artery and an 8 French sheath was placed in the right common femoral artery. Under fluoroscopy, an 8 [place] Walrus balloon guide catheter was navigated over a 6 range Berenstein 2 catheter and a 0.035 inch Terumo Glidewire into the aortic arch. Under fluoroscopy, the catheter was advanced into the cervical right internal carotid artery. The wire and 6 French catheter were removed and angiograms of the head were  obtained in frontal and lateral views. FINDINGS: There is a left M2/MCA superior division branch occlusion. Prominent tortuosity of the cervical left ICA and left carotid siphon. PROCEDURE: A large bore catheter aspiration catheter was navigated through the balloon guide catheter and over a phenom 21 microcatheter and a synchro2 support microguidewire into the cavernous segment of the left ICA. The microcatheter was then advanced into the left M2/MCA superior division branch, distal to the point of occlusion. Frontal and lateral angiograms were obtained with microcatheter contrast injection. A 4 x 40 mm  solitaire stent retriever was subsequently deployed spanning the distal left M1-mid M2. The device was allowed to intercalated with the clot for 4 minutes. The microcatheter was removed. The guiding catheter balloon was inflated and constant aspiration was performed, the aspiration catheter was navigated near the occlusion and connected to a penumbra aspiration pump. The stent retriever was then retracted along with the aspiration catheter. Frontal and lateral angiograms were obtained showing persistent left M2/MCA occlusion. In a similar fashion and using the same platform, a second stent retriever pass was performed with the solitaire device. Frontal and lateral angiograms were obtained showing persistent left M2/MCA occlusion with the clot migrating proximally. A catalyst 6 aspiration catheter was navigated through the balloon guide catheter and over a phenom 21 microcatheter and posterior 0.014 inch Aristotle microguidewire into the cavernous segment of the left ICA. The microcatheter was then advanced into the left M2/MCA superior division branch, distal to the point of occlusion. Frontal and lateral angiograms were obtained with microcatheter contrast injection. A 4 x 41 mm trevo stent retriever was subsequently deployed spanning the distal left M1-mid M2. The device was allowed to intercalated with the clot  for 4 minutes. The microcatheter was removed. The guiding catheter balloon was inflated and constant aspiration was performed, the aspiration catheter was navigated near the occlusion and connected to a penumbra aspiration pump. The stent retriever was then retracted along with the aspiration catheter. Frontal and lateral angiograms were obtained showing distal contrast penetration into the left M2/MCA superior division branch with severe vasospasm. Intra arterial infusion of 5 mg of verapamil was performed over 10 minutes. Follow-up angiogram showed reocclusion of the left M2/MCA superior division branch. A 3 max aspiration catheter was navigated through the balloon guide catheter and over the Aristotle microguidewire into the left and 2/MCA superior division branch at the level of occlusion. Continuous is aspiration was performed for 4 minutes. The guiding catheter balloon was inflated and the aspiration catheter was then removed. Follow-up left ICA angiograms with frontal and lateral views showed recanalization of the left M2/MCA superior division branch with slow flow in the distal angular branch (TICI 2C). A flat panel CT of the head was then obtained. Moderate left sylvian subarachnoid hemorrhage was identified. The catheter was then removed. A right common femoral artery angiogram was obtained via sheath side port. A 6 French Perclose ProGlide was utilized for right, femoral access closure. Immediate hemostasis was achieved. Patient was transferred to ICU for continued monitoring. Family and neurology team communicated immediately after the end of the procedure. IMPRESSION: 1. Left M2/MCA superior division branch occlusion. 2. Successful mechanical thrombectomy/aspiration performed with TICI 2C final recanalization. A total of 3 stent retriever passes and 1 aspiration pass performed. 3. Moderate size left sylvian subarachnoid hemorrhage seen on postprocedure flat panel CT. PLAN: Continued monitoring in ICU  level of care. Repeat head CT within 4 hours to evaluate for subarachnoid hemorrhage stability. Electronically Signed   By: Pedro Earls M.D.   On: 07/20/2019 09:27   IR PERCUTANEOUS ART THROMBECTOMY/INFUSION INTRACRANIAL INC DIAG ANGIO  Result Date: 07/20/2019 INDICATION: 74 year old female with past medical history significant for hypothyroidism presenting as a code stroke with right-sided hemiplegia and new onset atrial fibrillation. Her last known well was 21:50 on 07/19/2019. Head CT showed loss of gray-white differentiation in the left frontal for colon extending to the anterior aspect of the insula with no hemorrhage. CT angiogram showed a left M2/MCA/superior division branch occlusion. NIHSS 25. EXAM: Diagnostic cerebral angiogram  Mechanical thrombectomy Flat panel head CT COMPARISON:  CT/CT angiogram of the head and neck July 19, 2019 MEDICATIONS: 5 mg of problem you have intra arterial to the left ICA. ANESTHESIA/SEDATION: General anesthesia CONTRAST:  75 mL FLUOROSCOPY TIME:  Fluoroscopy Time: 55 minutes 18 seconds (1,796 mGy). COMPLICATIONS: Moderate left sylvian subarachnoid hemorrhage TECHNIQUE: Informed written consent was obtained from the patient's son after a thorough discussion of the procedural risks, benefits and alternatives. All questions were addressed. Maximal Sterile Barrier Technique was utilized including caps, mask, sterile gowns, sterile gloves, sterile drape, hand hygiene and skin antiseptic. A timeout was performed prior to the initiation of the procedure. Using a micropuncture kit and modified Seldinger technique, access was gained to the right common femoral artery and an 8 French sheath was placed in the right common femoral artery. Under fluoroscopy, an 8 [place] Walrus balloon guide catheter was navigated over a 6 range Berenstein 2 catheter and a 0.035 inch Terumo Glidewire into the aortic arch. Under fluoroscopy, the catheter was advanced into the  cervical right internal carotid artery. The wire and 6 French catheter were removed and angiograms of the head were obtained in frontal and lateral views. FINDINGS: There is a left M2/MCA superior division branch occlusion. Prominent tortuosity of the cervical left ICA and left carotid siphon. PROCEDURE: A large bore catheter aspiration catheter was navigated through the balloon guide catheter and over a phenom 21 microcatheter and a synchro2 support microguidewire into the cavernous segment of the left ICA. The microcatheter was then advanced into the left M2/MCA superior division branch, distal to the point of occlusion. Frontal and lateral angiograms were obtained with microcatheter contrast injection. A 4 x 40 mm solitaire stent retriever was subsequently deployed spanning the distal left M1-mid M2. The device was allowed to intercalated with the clot for 4 minutes. The microcatheter was removed. The guiding catheter balloon was inflated and constant aspiration was performed, the aspiration catheter was navigated near the occlusion and connected to a penumbra aspiration pump. The stent retriever was then retracted along with the aspiration catheter. Frontal and lateral angiograms were obtained showing persistent left M2/MCA occlusion. In a similar fashion and using the same platform, a second stent retriever pass was performed with the solitaire device. Frontal and lateral angiograms were obtained showing persistent left M2/MCA occlusion with the clot migrating proximally. A catalyst 6 aspiration catheter was navigated through the balloon guide catheter and over a phenom 21 microcatheter and posterior 0.014 inch Aristotle microguidewire into the cavernous segment of the left ICA. The microcatheter was then advanced into the left M2/MCA superior division branch, distal to the point of occlusion. Frontal and lateral angiograms were obtained with microcatheter contrast injection. A 4 x 41 mm trevo stent retriever  was subsequently deployed spanning the distal left M1-mid M2. The device was allowed to intercalated with the clot for 4 minutes. The microcatheter was removed. The guiding catheter balloon was inflated and constant aspiration was performed, the aspiration catheter was navigated near the occlusion and connected to a penumbra aspiration pump. The stent retriever was then retracted along with the aspiration catheter. Frontal and lateral angiograms were obtained showing distal contrast penetration into the left M2/MCA superior division branch with severe vasospasm. Intra arterial infusion of 5 mg of verapamil was performed over 10 minutes. Follow-up angiogram showed reocclusion of the left M2/MCA superior division branch. A 3 max aspiration catheter was navigated through the balloon guide catheter and over the Aristotle microguidewire into the left and 2/MCA superior  division branch at the level of occlusion. Continuous is aspiration was performed for 4 minutes. The guiding catheter balloon was inflated and the aspiration catheter was then removed. Follow-up left ICA angiograms with frontal and lateral views showed recanalization of the left M2/MCA superior division branch with slow flow in the distal angular branch (TICI 2C). A flat panel CT of the head was then obtained. Moderate left sylvian subarachnoid hemorrhage was identified. The catheter was then removed. A right common femoral artery angiogram was obtained via sheath side port. A 6 French Perclose ProGlide was utilized for right, femoral access closure. Immediate hemostasis was achieved. Patient was transferred to ICU for continued monitoring. Family and neurology team communicated immediately after the end of the procedure. IMPRESSION: 1. Left M2/MCA superior division branch occlusion. 2. Successful mechanical thrombectomy/aspiration performed with TICI 2C final recanalization. A total of 3 stent retriever passes and 1 aspiration pass performed. 3. Moderate  size left sylvian subarachnoid hemorrhage seen on postprocedure flat panel CT. PLAN: Continued monitoring in ICU level of care. Repeat head CT within 4 hours to evaluate for subarachnoid hemorrhage stability. Electronically Signed   By: Pedro Earls M.D.   On: 07/20/2019 09:27   CT HEAD CODE STROKE WO CONTRAST  Result Date: 07/19/2019 CLINICAL DATA:  Code stroke. Initial evaluation for right-sided facial droop with right-sided weakness. EXAM: CT HEAD WITHOUT CONTRAST TECHNIQUE: Contiguous axial images were obtained from the base of the skull through the vertex without intravenous contrast. COMPARISON:  None. FINDINGS: Brain: Mild age-related cerebral atrophy. Small focus of encephalomalacia involving the cortical/subcortical high left frontal lobe consistent with a small chronic left MCA territory infarct (series 3, image 25). Few scattered parenchymal calcifications noted at the right frontal and left parietal region. There is subtle loss of gray-white matter differentiation involving the region of the left frontal operculum, suspicious for early/developing acute left MCA territory infarct. Left insula relatively well maintained at this time as are the deep gray nuclei. No intracranial hemorrhage or mass effect. No other acute large vessel territory infarct. No mass lesion or midline shift. No hydrocephalus. No extra-axial fluid collection. Vascular: Asymmetric hyperdensity seen M2 branch (series 6, image 45), suspicious for LV of. Of distal left M1 and/or proximal Skull: Scalp soft tissues and calvarium within normal limits. Sinuses/Orbits: Left gaze noted. Globes and orbital soft tissues otherwise unremarkable. Mild mucosal thickening noted within the right sphenoid sinus. Chronic appearing left mastoid and middle ear effusion noted. Visualized nasopharynx within normal limits. Other: None. ASPECTS [facility] Stroke Program Early CT Score) - Ganglionic level infarction (caudate, lentiform  nuclei, internal capsule, insula, M1-M3 cortex): 7 - Supraganglionic infarction (M4-M6 cortex): 2 Total score (0-10 with 10 being normal): 9 IMPRESSION: 1. Subtle loss of gray-white matter differentiation involving the left frontal operculum, concerning for acute left MCA territory infarct. Hyperdensity at the level of a distal left M1/proximal M2 branch concerning for LVO. Further assessment with dedicated CTA recommended. No intracranial hemorrhage. 2. ASPECTS is 9. 3. Small chronic high left frontal infarct. Critical Value/emergent results were called by telephone at the time of interpretation on 07/19/2019 at 11:10 pm to provider ERIC [facility] , who verbally acknowledged these results. Electronically Signed   By: Jeannine Boga M.D.   On: 07/19/2019 23:35    Labs:  CBC: Recent Labs    07/19/19 2300 07/19/19 2300 07/19/19 2305 07/20/19 0356 07/20/19 0426 07/20/19 0901  WBC 6.9  --   --  9.2  --   --   HGB  14.8   < > 15.0 13.7 14.3 13.3  HCT 45.4   < > 44.0 42.3 42.0 39.0  PLT 256  --   --  264  --   --    < > = values in this interval not displayed.    COAGS: Recent Labs    07/19/19 2300  INR 1.0  APTT 30    BMP: Recent Labs    07/19/19 2300 07/19/19 2300 07/19/19 2305 07/20/19 0356 07/20/19 0426 07/20/19 0901  NA 140   < > 140 137 142 143  K 4.3   < > 4.2 4.0 4.0 4.0  CL 107  --  105 110  --   --   CO2 24  --   --  20*  --   --   GLUCOSE 105*  --  101* 192*  --   --   BUN 18  --  19 14  --   --   CALCIUM 9.2  --   --  7.7*  --   --   CREATININE 0.92  --  0.90 0.82  --   --   GFRNONAA >60  --   --  >60  --   --   GFRAA >60  --   --  >60  --   --    < > = values in this interval not displayed.    LIVER FUNCTION TESTS: Recent Labs    07/19/19 2300  BILITOT 0.9  AST 22  ALT 15  ALKPHOS 106  PROT 7.8  ALBUMIN 4.0    Assessment and Plan:  74 y/o F who presented to [facility] ED early this morning as a code stroke found to have a left MCA/M2 occlusion  requiring tPA and mechanical thrombectomy in NIR with Dr. Ladean Raya with moderate left sylvian SAH noted post procedure.   Patient remains intubated/ventilated due to coffee ground emesis x 2 this morning, propofol d/ced but remains on fentanyl gtt and neosynephrine. 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 Patient grimacing/moving left upper and lower extremities with adjustment of ETT by RRT this morning on exam but does not follow commands.  CT head w/o contrast this morning shows continued further demarcation of an acute ischemic infarct centered within the anterolateral left frontal lobe with involvement of the left frontal operculum as well as left insula and subinsular region. Small to moderate volume acute SAH within the left sylvian fissure and extending into the basal cisterns, not significantly changed from CT 07/19/19.  Right CFA puncture site clean, dry, dressed appropriately without bleeding or induration.   Further plans per neurology/CCM - IR available as needed, please call with questions or concerns.  Electronically Signed: Joaquim Nam, PA-C 07/20/2019, 11:24 AM   I spent a total of 15 Minutes at the the patient's bedside AND on the patient's hospital floor or unit, greater than 50% of which was counseling/coordinating care for mechanical thrombectomy follow up.

## 2019-07-20 NOTE — Progress Notes (Signed)
Neuro MD notified of CT results. No new orders. RN will continue to monitor.

## 2019-07-20 NOTE — Progress Notes (Signed)
Assisted tele visit to patient with family member.  Tejon Gracie R, RN  

## 2019-07-20 NOTE — Progress Notes (Signed)
eLink Physician-Brief Progress Note Patient Name: Jamie Mitchell DOB: December 17, 1945 MRN: [id number]   Date of Service  07/20/2019  HPI/Events of Note  Pt with malfunctioning arterial line. She requires close BP monitoring s/p MCA territory infarct.  eICU Interventions  Discontinue malfunctioning line and insert new arterial line in opposite arm.        Frederik Pear 07/20/2019, 8:46 PM

## 2019-07-20 NOTE — Progress Notes (Signed)
eLink Physician-Brief Progress Note Patient Name: Jamie Mitchell DOB: 1946/04/21 MRN: [id number]   Date of Service  07/20/2019  HPI/Events of Note  Pt with longer pause-up to 7 seconds,  HR now back up to 83.  eICU Interventions  RN instructed to call EP cardiologist with the update, EP recommendation currently is to just observe the patient.        Kerry Kass Roseanna Koplin 07/20/2019, 11:01 PM

## 2019-07-20 NOTE — Anesthesia Preprocedure Evaluation (Signed)
Anesthesia Evaluation   Patient confused    Reviewed: Unable to perform ROS - Chart review onlyPreop documentation limited or incomplete due to emergent nature of procedure.  Airway        Dental   Pulmonary           Cardiovascular hypertension,  Rhythm:Irregular Rate:Tachycardia     Neuro/Psych    GI/Hepatic   Endo/Other    Renal/GU      Musculoskeletal   Abdominal   Peds  Hematology   Anesthesia Other Findings Code Stroke, when I arrived patient was non verbal and unable to follow commands. No staff from IR or ED were at patient's bedside to provide clinical details. Per CRNA, ED RN to CRNA report provided only that the patient had received tPA, was Spanish speaking, and no other medical history. Consent was signed.   Reproductive/Obstetrics                             Anesthesia Physical Anesthesia Plan  ASA: IV and emergent  Anesthesia Plan: General   Post-op Pain Management:    Induction: Intravenous, Rapid sequence and Cricoid pressure planned  PONV Risk Score and Plan: 3 and Treatment may vary due to age or medical condition  Airway Management Planned: Oral ETT  Additional Equipment: Arterial line  Intra-op Plan:   Post-operative Plan: Post-operative intubation/ventilation  Informed Consent:     History available from chart only and Only emergency history available  Plan Discussed with: CRNA  Anesthesia Plan Comments:         Anesthesia Quick Evaluation

## 2019-07-20 NOTE — Progress Notes (Signed)
CT head completed. Findings are as follows: -- Subarachnoid hemorrhage at the base of the brain and sylvian fissure on the left is becoming less dense. No evidence of additional bleeding. -- Well-circumscribed infarction in the left frontal operculum, insula and subinsular brain as seen previously. No evidence of infarct extension. Mild regional swelling with left-to-right shift of 1-2 mm. No infarct hematoma.  A/P: -- Continue to hold off on antiplatelet medications and anticoagulants. DVT prophylaxis with SCDs.  -- Continue to monitor.    Electronically signed: Dr. Kerney Elbe

## 2019-07-20 NOTE — Progress Notes (Signed)
Patient currently has 3 working PIVs at this time. 1 in each [facility]. And 1 in hand. Only 1 needing continuous infusion at this time. Discussed with nurses at bedside to continue to use as needed to decrease risk of venous scaring. Instructed to notify [facility] if further PIV placement is needed. VU. Fran Lowes, RN [facility]

## 2019-07-20 NOTE — Progress Notes (Signed)
Assisted tele visit to patient with son.  Thomas, Gumecindo Hopkin Renee, RN   

## 2019-07-20 NOTE — Progress Notes (Signed)
STROKE TEAM PROGRESS NOTE   INTERVAL HISTORY Pt still intubated. Son at bedside as interpretor. Pt eyes open but not following commands. Right sided weakness much improved with pain stimulation. Left side spontaneous movement. Overnight had afib RVR as well pauses. Cardiology consulted and appreciate their help. Pt intermittently agitated on vent with vomiting x 2. 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 Will keep BP goal 120-160. Pending MRI in the evening. Will remains intubate for today given cardiac pauses, SAH, pending MRI and vomiting. Discussed with CCM.   Vitals:   07/20/19 0749 07/20/19 0754 07/20/19 0800 07/20/19 0900  BP:   (!) 112/57 115/71  Pulse:   67 82  Resp:   14 13  Temp:  98.3 F (36.8 C)    TempSrc:  Axillary    SpO2: 96% 98% 98% 98%  Weight:      Height:        CBC:  Recent Labs  Lab 07/19/19 2300 07/19/19 2305 07/20/19 0356 07/20/19 0356 07/20/19 0426 07/20/19 0901  WBC 6.9  --  9.2  --   --   --   NEUTROABS 2.7  --   --   --   --   --   HGB 14.8   < > 13.7   < > 14.3 13.3  HCT 45.4   < > 42.3   < > 42.0 39.0  MCV 92.5  --  91.6  --   --   --   PLT 256  --  264  --   --   --    < > = values in this interval not displayed.    Basic Metabolic Panel:  Recent Labs  Lab 07/19/19 2300 07/19/19 2300 07/19/19 2305 07/19/19 2305 07/20/19 0356 07/20/19 0356 07/20/19 0426 07/20/19 0901  NA 140   < > 140   < > 137   < > 142 143  K 4.3   < > 4.2   < > 4.0   < > 4.0 4.0  CL 107   < > 105  --  110  --   --   --   CO2 24  --   --   --  20*  --   --   --   GLUCOSE 105*   < > 101*  --  192*  --   --   --   BUN 18   < > 19  --  14  --   --   --   CREATININE 0.92   < > 0.90  --  0.82  --   --   --   CALCIUM 9.2  --   --   --  7.7*  --   --   --   MG  --   --   --   --  1.9  --   --   --   PHOS  --   --   --   --  3.9  --   --   --    < > = values in this interval not displayed.   Lipid Panel:     Component Value Date/Time   CHOL 113 07/20/2019 0356    CHOL 137 02/26/2017 1035   TRIG 57 07/20/2019 0356   HDL 36 (L) 07/20/2019 0356   HDL 35 (L) 02/26/2017 1035   CHOLHDL 3.1 07/20/2019 0356   VLDL 11 07/20/2019 0356   LDLCALC 66 07/20/2019 0356   LDLCALC 49  02/26/2017 1035   HgbA1c:  Lab Results  Component Value Date   HGBA1C 6.1 (H) 07/20/2019   HGBA1C 6.0 (H) 07/20/2019   Urine Drug Screen: No results found for: LABOPIA, COCAINSCRNUR, LABBENZ, AMPHETMU, THCU, LABBARB  Alcohol Level     Component Value Date/Time   ETH <10 07/19/2019 2300    IMAGING past 48 hours CT Code Stroke CTA Head W/WO contrast  Result Date: 07/19/2019 CLINICAL DATA:  Initial evaluation for acute right-sided weakness. EXAM: CT ANGIOGRAPHY HEAD AND NECK TECHNIQUE: Multidetector CT imaging of the head and neck was performed using the standard protocol during bolus administration of intravenous contrast. Multiplanar CT image reconstructions and MIPs were obtained to evaluate the vascular anatomy. Carotid stenosis measurements (when applicable) are obtained utilizing NASCET criteria, using the distal internal carotid diameter as the denominator. CONTRAST:  34m OMNIPAQUE IOHEXOL 350 MG/ML SOLN COMPARISON:  Prior head CT from earlier same day. FINDINGS: CTA NECK FINDINGS Aortic arch: Visualized aortic arch normal in caliber with normal branch pattern. Bovine branching pattern with common origin of the right brachiocephalic and left common carotid artery noted. No hemodynamically significant stenosis seen about the origin of the great vessels. Subclavian arteries widely patent. Right carotid system: Right common and internal carotid arteries widely patent to the skull base without stenosis, dissection or occlusion. Proximal right ICA medialized into the retropharyngeal space. Focal tortuosity the distal cervical right ICA noted. Left carotid system: Left common and internal carotid arteries widely patent to the skull base without stenosis, dissection or occlusion.  Proximal left ICA medialized into the retropharyngeal space. Evaluation the distal cervical left ICA limited by motion. Vertebral arteries: Both vertebral arteries arise from the subclavian arteries. Vertebral arteries mildly tortuous but widely patent within the neck without stenosis, dissection or occlusion. Skeleton: No acute osseous abnormality. No discrete lytic or blastic osseous lesions. Prominent degenerative changes noted at the C1-2 articulation, greater on the right. Other neck: No other acute soft tissue abnormality within the neck. No mass lesion or adenopathy. Upper chest: Mild scattered atelectatic changes noted within the visualized lungs. Visualized upper chest demonstrates no acute finding. Review of the MIP images confirms the above findings CTA HEAD FINDINGS Anterior circulation: Examination mildly degraded by motion artifact. Petrous, cavernous, and supraclinoid ICAs widely patent without flow-limiting stenosis. ICA termini well perfused. A1 segments patent bilaterally. Normal anterior communicating artery complex. Anterior cerebral arteries widely patent to their distal aspects without stenosis. Right M1 widely patent. Normal right MCA bifurcation. Distal right MCA branches well perfused. Left M1 widely patent. Normal left MCA bifurcation. Occlusive thrombus seen within a proximal left M2 branch, superior division (series 7, image 94), acute in nature. Attenuated opacification of left MCA branches distally, either due to subocclusive thrombus and/or moderate collateralization. Remainder of the left MCA branches remain patent. Posterior circulation: Vertebral arteries grossly patent to the vertebrobasilar junction without appreciable stenosis, although evaluation limited by motion. Neither PICA well visualized. Basilar diminutive but patent to its distal aspect without appreciable stenosis. Superior cerebral arteries patent bilaterally. Predominant fetal type origin of both PCAs, supplied via  widely patent and robust posterior communicating arteries. Both PCAs well perfused to their distal aspects. Venous sinuses: Grossly patent allowing for timing the contrast bolus. Anatomic variants: Fetal type origin of the PCAs with overall diminutive vertebrobasilar system. No appreciable aneurysm. Review of the MIP images confirms the above findings IMPRESSION: 1. Positive CTA for emergent large vessel occlusion, with occlusive thrombus within a proximal left M2 branch, superior division. Attenuated flow  distally either due to subocclusive thrombus and/or collateralization. 2. Diffuse tortuosity about the major arterial vasculature of the head and neck, suggesting chronic underlying hypertension. No other hemodynamically significant or correctable stenosis. 3. Fetal type origin of the PCAs with overall diminutive vertebrobasilar system. Critical Value/emergent results were called by telephone at the time of interpretation on 07/19/2019 at 11:26 pm to provider ERIC [facility] , who verbally acknowledged these results. Electronically Signed   By: Jeannine Boga M.D.   On: 07/19/2019 23:53   CT HEAD WO CONTRAST  Result Date: 07/20/2019 CLINICAL DATA:  Stroke, follow-up. EXAM: CT HEAD WITHOUT CONTRAST TECHNIQUE: Contiguous axial images were obtained from the base of the skull through the vertex without intravenous contrast. COMPARISON:  Postprocedure head CT 07/20/2019, noncontrast head CT and CT angiogram head/neck 07/19/2019 FINDINGS: Brain: Mildly motion degraded examination. There is residual circulating contrast. There has been continued further demarcation of an acute ischemic infarct centered within the anterolateral left frontal lobe with involvement of the left frontal operculum as well as left insula and subinsular region. There may be subtle petechial hemorrhage in the infarction territory. There is no significant mass effect at this time. No midline shift. Small to moderate volume acute subarachnoid  hemorrhage within the left sylvian fissure and extending to the basal cisterns does not appear significantly changed as compared postprocedural head CT 07/19/2019. No evidence of hydrocephalus. Vascular: Circulating contrast material limits evaluation for abnormal vascular hyperdensity. Skull: Normal. Negative for fracture or focal lesion. Sinuses/Orbits: Visualized orbits demonstrate no acute abnormality. Mild right maxillary sinus mucosal thickening. Redemonstrated left middle ear/mastoid effusion. IMPRESSION: Continued further demarcation of an acute ischemic infarct centered within the anterolateral left frontal lobe with involvement of the left frontal operculum as well as left insula and subinsular region. There may be mild petechial hemorrhage in the infarction territory. No significant mass effect at this time. Small to moderate volume acute subarachnoid hemorrhage within the left sylvian fissure and extending to the basal cisterns, not significantly changed from postprocedural head CT 07/19/2019. No evidence of hydrocephalus. Electronically Signed   By: Kellie Simmering DO   On: 07/20/2019 09:10   CT Code Stroke CTA Neck W/WO contrast  Result Date: 07/19/2019 CLINICAL DATA:  Initial evaluation for acute right-sided weakness. EXAM: CT ANGIOGRAPHY HEAD AND NECK TECHNIQUE: Multidetector CT imaging of the head and neck was performed using the standard protocol during bolus administration of intravenous contrast. Multiplanar CT image reconstructions and MIPs were obtained to evaluate the vascular anatomy. Carotid stenosis measurements (when applicable) are obtained utilizing NASCET criteria, using the distal internal carotid diameter as the denominator. CONTRAST:  67m OMNIPAQUE IOHEXOL 350 MG/ML SOLN COMPARISON:  Prior head CT from earlier same day. FINDINGS: CTA NECK FINDINGS Aortic arch: Visualized aortic arch normal in caliber with normal branch pattern. Bovine branching pattern with common origin of the  right brachiocephalic and left common carotid artery noted. No hemodynamically significant stenosis seen about the origin of the great vessels. Subclavian arteries widely patent. Right carotid system: Right common and internal carotid arteries widely patent to the skull base without stenosis, dissection or occlusion. Proximal right ICA medialized into the retropharyngeal space. Focal tortuosity the distal cervical right ICA noted. Left carotid system: Left common and internal carotid arteries widely patent to the skull base without stenosis, dissection or occlusion. Proximal left ICA medialized into the retropharyngeal space. Evaluation the distal cervical left ICA limited by motion. Vertebral arteries: Both vertebral arteries arise from the subclavian arteries. Vertebral arteries mildly tortuous but  widely patent within the neck without stenosis, dissection or occlusion. Skeleton: No acute osseous abnormality. No discrete lytic or blastic osseous lesions. Prominent degenerative changes noted at the C1-2 articulation, greater on the right. Other neck: No other acute soft tissue abnormality within the neck. No mass lesion or adenopathy. Upper chest: Mild scattered atelectatic changes noted within the visualized lungs. Visualized upper chest demonstrates no acute finding. Review of the MIP images confirms the above findings CTA HEAD FINDINGS Anterior circulation: Examination mildly degraded by motion artifact. Petrous, cavernous, and supraclinoid ICAs widely patent without flow-limiting stenosis. ICA termini well perfused. A1 segments patent bilaterally. Normal anterior communicating artery complex. Anterior cerebral arteries widely patent to their distal aspects without stenosis. Right M1 widely patent. Normal right MCA bifurcation. Distal right MCA branches well perfused. Left M1 widely patent. Normal left MCA bifurcation. Occlusive thrombus seen within a proximal left M2 branch, superior division (series 7, image  94), acute in nature. Attenuated opacification of left MCA branches distally, either due to subocclusive thrombus and/or moderate collateralization. Remainder of the left MCA branches remain patent. Posterior circulation: Vertebral arteries grossly patent to the vertebrobasilar junction without appreciable stenosis, although evaluation limited by motion. Neither PICA well visualized. Basilar diminutive but patent to its distal aspect without appreciable stenosis. Superior cerebral arteries patent bilaterally. Predominant fetal type origin of both PCAs, supplied via widely patent and robust posterior communicating arteries. Both PCAs well perfused to their distal aspects. Venous sinuses: Grossly patent allowing for timing the contrast bolus. Anatomic variants: Fetal type origin of the PCAs with overall diminutive vertebrobasilar system. No appreciable aneurysm. Review of the MIP images confirms the above findings IMPRESSION: 1. Positive CTA for emergent large vessel occlusion, with occlusive thrombus within a proximal left M2 branch, superior division. Attenuated flow distally either due to subocclusive thrombus and/or collateralization. 2. Diffuse tortuosity about the major arterial vasculature of the head and neck, suggesting chronic underlying hypertension. No other hemodynamically significant or correctable stenosis. 3. Fetal type origin of the PCAs with overall diminutive vertebrobasilar system. Critical Value/emergent results were called by telephone at the time of interpretation on 07/19/2019 at 11:26 pm to provider ERIC [facility] , who verbally acknowledged these results. Electronically Signed   By: Jeannine Boga M.D.   On: 07/19/2019 23:53   IR CT Head Ltd  Result Date: 07/20/2019 INDICATION: 74 year old female with past medical history significant for hypothyroidism presenting as a code stroke with right-sided hemiplegia and new onset atrial fibrillation. Her last known well was 21:50 on 07/19/2019.  Head CT showed loss of gray-white differentiation in the left frontal for colon extending to the anterior aspect of the insula with no hemorrhage. CT angiogram showed a left M2/MCA/superior division branch occlusion. NIHSS 25. EXAM: Diagnostic cerebral angiogram Mechanical thrombectomy Flat panel head CT COMPARISON:  CT/CT angiogram of the head and neck July 19, 2019 MEDICATIONS: 5 mg of problem you have intra arterial to the left ICA. ANESTHESIA/SEDATION: General anesthesia CONTRAST:  75 mL FLUOROSCOPY TIME:  Fluoroscopy Time: 55 minutes 18 seconds (1,796 mGy). COMPLICATIONS: Moderate left sylvian subarachnoid hemorrhage TECHNIQUE: Informed written consent was obtained from the patient's son after a thorough discussion of the procedural risks, benefits and alternatives. All questions were addressed. Maximal Sterile Barrier Technique was utilized including caps, mask, sterile gowns, sterile gloves, sterile drape, hand hygiene and skin antiseptic. A timeout was performed prior to the initiation of the procedure. Using a micropuncture kit and modified Seldinger technique, access was gained to the right common femoral artery and  an 8 [place] sheath was placed in the right common femoral artery. Under fluoroscopy, an 8 [place] Walrus balloon guide catheter was navigated over a 6 range Berenstein 2 catheter and a 0.035 inch Terumo Glidewire into the aortic arch. Under fluoroscopy, the catheter was advanced into the cervical right internal carotid artery. The wire and 6 French catheter were removed and angiograms of the head were obtained in frontal and lateral views. FINDINGS: There is a left M2/MCA superior division branch occlusion. Prominent tortuosity of the cervical left ICA and left carotid siphon. PROCEDURE: A large bore catheter aspiration catheter was navigated through the balloon guide catheter and over a phenom 21 microcatheter and a synchro2 support microguidewire into the cavernous segment of the left ICA.  The microcatheter was then advanced into the left M2/MCA superior division branch, distal to the point of occlusion. Frontal and lateral angiograms were obtained with microcatheter contrast injection. A 4 x 40 mm solitaire stent retriever was subsequently deployed spanning the distal left M1-mid M2. The device was allowed to intercalated with the clot for 4 minutes. The microcatheter was removed. The guiding catheter balloon was inflated and constant aspiration was performed, the aspiration catheter was navigated near the occlusion and connected to a penumbra aspiration pump. The stent retriever was then retracted along with the aspiration catheter. Frontal and lateral angiograms were obtained showing persistent left M2/MCA occlusion. In a similar fashion and using the same platform, a second stent retriever pass was performed with the solitaire device. Frontal and lateral angiograms were obtained showing persistent left M2/MCA occlusion with the clot migrating proximally. A catalyst 6 aspiration catheter was navigated through the balloon guide catheter and over a phenom 21 microcatheter and posterior 0.014 inch Aristotle microguidewire into the cavernous segment of the left ICA. The microcatheter was then advanced into the left M2/MCA superior division branch, distal to the point of occlusion. Frontal and lateral angiograms were obtained with microcatheter contrast injection. A 4 x 41 mm trevo stent retriever was subsequently deployed spanning the distal left M1-mid M2. The device was allowed to intercalated with the clot for 4 minutes. The microcatheter was removed. The guiding catheter balloon was inflated and constant aspiration was performed, the aspiration catheter was navigated near the occlusion and connected to a penumbra aspiration pump. The stent retriever was then retracted along with the aspiration catheter. Frontal and lateral angiograms were obtained showing distal contrast penetration into the left  M2/MCA superior division branch with severe vasospasm. Intra arterial infusion of 5 mg of verapamil was performed over 10 minutes. Follow-up angiogram showed reocclusion of the left M2/MCA superior division branch. A 3 max aspiration catheter was navigated through the balloon guide catheter and over the Aristotle microguidewire into the left and 2/MCA superior division branch at the level of occlusion. Continuous is aspiration was performed for 4 minutes. The guiding catheter balloon was inflated and the aspiration catheter was then removed. Follow-up left ICA angiograms with frontal and lateral views showed recanalization of the left M2/MCA superior division branch with slow flow in the distal angular branch (TICI 2C). A flat panel CT of the head was then obtained. Moderate left sylvian subarachnoid hemorrhage was identified. The catheter was then removed. A right common femoral artery angiogram was obtained via sheath side port. A 6 French Perclose ProGlide was utilized for right, femoral access closure. Immediate hemostasis was achieved. Patient was transferred to ICU for continued monitoring. Family and neurology team communicated immediately after the end of the procedure. IMPRESSION: 1. Left  M2/MCA superior division branch occlusion. 2. Successful mechanical thrombectomy/aspiration performed with TICI 2C final recanalization. A total of 3 stent retriever passes and 1 aspiration pass performed. 3. Moderate size left sylvian subarachnoid hemorrhage seen on postprocedure flat panel CT. PLAN: Continued monitoring in ICU level of care. Repeat head CT within 4 hours to evaluate for subarachnoid hemorrhage stability. Electronically Signed   By: Pedro Earls M.D.   On: 07/20/2019 09:27   IR PERCUTANEOUS ART THROMBECTOMY/INFUSION INTRACRANIAL INC DIAG ANGIO  Result Date: 07/20/2019 INDICATION: 74 year old female with past medical history significant for hypothyroidism presenting as a code stroke  with right-sided hemiplegia and new onset atrial fibrillation. Her last known well was 21:50 on 07/19/2019. Head CT showed loss of gray-white differentiation in the left frontal for colon extending to the anterior aspect of the insula with no hemorrhage. CT angiogram showed a left M2/MCA/superior division branch occlusion. NIHSS 25. EXAM: Diagnostic cerebral angiogram Mechanical thrombectomy Flat panel head CT COMPARISON:  CT/CT angiogram of the head and neck July 19, 2019 MEDICATIONS: 5 mg of problem you have intra arterial to the left ICA. ANESTHESIA/SEDATION: General anesthesia CONTRAST:  75 mL FLUOROSCOPY TIME:  Fluoroscopy Time: 55 minutes 18 seconds (1,796 mGy). COMPLICATIONS: Moderate left sylvian subarachnoid hemorrhage TECHNIQUE: Informed written consent was obtained from the patient's son after a thorough discussion of the procedural risks, benefits and alternatives. All questions were addressed. Maximal Sterile Barrier Technique was utilized including caps, mask, sterile gowns, sterile gloves, sterile drape, hand hygiene and skin antiseptic. A timeout was performed prior to the initiation of the procedure. Using a micropuncture kit and modified Seldinger technique, access was gained to the right common femoral artery and an 8 French sheath was placed in the right common femoral artery. Under fluoroscopy, an 8 [place] Walrus balloon guide catheter was navigated over a 6 range Berenstein 2 catheter and a 0.035 inch Terumo Glidewire into the aortic arch. Under fluoroscopy, the catheter was advanced into the cervical right internal carotid artery. The wire and 6 French catheter were removed and angiograms of the head were obtained in frontal and lateral views. FINDINGS: There is a left M2/MCA superior division branch occlusion. Prominent tortuosity of the cervical left ICA and left carotid siphon. PROCEDURE: A large bore catheter aspiration catheter was navigated through the balloon guide catheter and over  a phenom 21 microcatheter and a synchro2 support microguidewire into the cavernous segment of the left ICA. The microcatheter was then advanced into the left M2/MCA superior division branch, distal to the point of occlusion. Frontal and lateral angiograms were obtained with microcatheter contrast injection. A 4 x 40 mm solitaire stent retriever was subsequently deployed spanning the distal left M1-mid M2. The device was allowed to intercalated with the clot for 4 minutes. The microcatheter was removed. The guiding catheter balloon was inflated and constant aspiration was performed, the aspiration catheter was navigated near the occlusion and connected to a penumbra aspiration pump. The stent retriever was then retracted along with the aspiration catheter. Frontal and lateral angiograms were obtained showing persistent left M2/MCA occlusion. In a similar fashion and using the same platform, a second stent retriever pass was performed with the solitaire device. Frontal and lateral angiograms were obtained showing persistent left M2/MCA occlusion with the clot migrating proximally. A catalyst 6 aspiration catheter was navigated through the balloon guide catheter and over a phenom 21 microcatheter and posterior 0.014 inch Aristotle microguidewire into the cavernous segment of the left ICA. The microcatheter was then advanced  into the left M2/MCA superior division branch, distal to the point of occlusion. Frontal and lateral angiograms were obtained with microcatheter contrast injection. A 4 x 41 mm trevo stent retriever was subsequently deployed spanning the distal left M1-mid M2. The device was allowed to intercalated with the clot for 4 minutes. The microcatheter was removed. The guiding catheter balloon was inflated and constant aspiration was performed, the aspiration catheter was navigated near the occlusion and connected to a penumbra aspiration pump. The stent retriever was then retracted along with the  aspiration catheter. Frontal and lateral angiograms were obtained showing distal contrast penetration into the left M2/MCA superior division branch with severe vasospasm. Intra arterial infusion of 5 mg of verapamil was performed over 10 minutes. Follow-up angiogram showed reocclusion of the left M2/MCA superior division branch. A 3 max aspiration catheter was navigated through the balloon guide catheter and over the Aristotle microguidewire into the left and 2/MCA superior division branch at the level of occlusion. Continuous is aspiration was performed for 4 minutes. The guiding catheter balloon was inflated and the aspiration catheter was then removed. Follow-up left ICA angiograms with frontal and lateral views showed recanalization of the left M2/MCA superior division branch with slow flow in the distal angular branch (TICI 2C). A flat panel CT of the head was then obtained. Moderate left sylvian subarachnoid hemorrhage was identified. The catheter was then removed. A right common femoral artery angiogram was obtained via sheath side port. A 6 French Perclose ProGlide was utilized for right, femoral access closure. Immediate hemostasis was achieved. Patient was transferred to ICU for continued monitoring. Family and neurology team communicated immediately after the end of the procedure. IMPRESSION: 1. Left M2/MCA superior division branch occlusion. 2. Successful mechanical thrombectomy/aspiration performed with TICI 2C final recanalization. A total of 3 stent retriever passes and 1 aspiration pass performed. 3. Moderate size left sylvian subarachnoid hemorrhage seen on postprocedure flat panel CT. PLAN: Continued monitoring in ICU level of care. Repeat head CT within 4 hours to evaluate for subarachnoid hemorrhage stability. Electronically Signed   By: Pedro Earls M.D.   On: 07/20/2019 09:27   CT HEAD CODE STROKE WO CONTRAST  Result Date: 07/19/2019 CLINICAL DATA:  Code stroke. Initial  evaluation for right-sided facial droop with right-sided weakness. EXAM: CT HEAD WITHOUT CONTRAST TECHNIQUE: Contiguous axial images were obtained from the base of the skull through the vertex without intravenous contrast. COMPARISON:  None. FINDINGS: Brain: Mild age-related cerebral atrophy. Small focus of encephalomalacia involving the cortical/subcortical high left frontal lobe consistent with a small chronic left MCA territory infarct (series 3, image 25). Few scattered parenchymal calcifications noted at the right frontal and left parietal region. There is subtle loss of gray-white matter differentiation involving the region of the left frontal operculum, suspicious for early/developing acute left MCA territory infarct. Left insula relatively well maintained at this time as are the deep gray nuclei. No intracranial hemorrhage or mass effect. No other acute large vessel territory infarct. No mass lesion or midline shift. No hydrocephalus. No extra-axial fluid collection. Vascular: Asymmetric hyperdensity seen M2 branch (series 6, image 45), suspicious for LV of. Of distal left M1 and/or proximal Skull: Scalp soft tissues and calvarium within normal limits. Sinuses/Orbits: Left gaze noted. Globes and orbital soft tissues otherwise unremarkable. Mild mucosal thickening noted within the right sphenoid sinus. Chronic appearing left mastoid and middle ear effusion noted. Visualized nasopharynx within normal limits. Other: None. ASPECTS [facility] Stroke Program Early CT Score) - Ganglionic level  infarction (caudate, lentiform nuclei, internal capsule, insula, M1-M3 cortex): 7 - Supraganglionic infarction (M4-M6 cortex): 2 Total score (0-10 with 10 being normal): 9 IMPRESSION: 1. Subtle loss of gray-white matter differentiation involving the left frontal operculum, concerning for acute left MCA territory infarct. Hyperdensity at the level of a distal left M1/proximal M2 branch concerning for LVO. Further assessment with  dedicated CTA recommended. No intracranial hemorrhage. 2. ASPECTS is 9. 3. Small chronic high left frontal infarct. Critical Value/emergent results were called by telephone at the time of interpretation on 07/19/2019 at 11:10 pm to provider ERIC [facility] , who verbally acknowledged these results. Electronically Signed   By: Jeannine Boga M.D.   On: 07/19/2019 23:35    PHYSICAL EXAM  Temp:  [94.9 F (34.9 C)-98.3 F (36.8 C)] 98.3 F (36.8 C) (02/25 0754) Pulse Rate:  [40-142] 81 (02/25 1104) Resp:  [13-21] 16 (02/25 1104) BP: (107-152)/(40-103) 124/68 (02/25 1104) SpO2:  [84 %-99 %] 97 % (02/25 1112) Arterial Line BP: (95-134)/(58-77) 103/73 (02/25 0900) FiO2 (%):  [50 %-60 %] 50 % (02/25 1112) Weight:  [68.8 kg] 68.8 kg (02/24 2346)  General - Well nourished, well developed, intubated on fentanyl.  Ophthalmologic - fundi not visualized due to noncooperation.  Cardiovascular - irregularly irregular heart rate and rhythm.  Neuro - intubated on fentanyl, eyes open, not following commands. Eyes in left gaze preference position, but able to cross midline with intermittent right gaze but not able to maintain right gaze position. Blinking to visual threat on the left but not on the right, PERRL. Corneal reflex present, gag and cough present. Breathing over the vent.  Facial symmetry not able to test due to ET tube.  Tongue protrusion not cooperative. On pain stimulation, RUE 3/5 and RLE 3+/5. LUE and LLE spontaneous movement against gravity. DTR 1+ and no babinski. Sensation, coordination and gait not tested.   ASSESSMENT/PLAN Ms. Daquisha [name] is a 74 y.o. female with history of HTN, HLD and thyroid disease presenting with R hemiparesis, Left gaze preference, R facial weakness, R field cut and global aphasia. AF noted by EMS. Received tPA 07/19/2019 at 2317. Found to have  L M2 occlusion and taken to IR.   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 Old high L frontal infarct. ASPECTS 9.    CTA head & neck L M2 LVO. Diffuse tortuosity head and neck. Fetal PCAs w/ small VB system.   Cerebral angio TICI2c revascularization LM2 superior division branch occlusion. Moderate L sylvian SAH   CT head post IR anterolateral L frontal lobe infarct in operculum, insula and subinsular region. Small to moderate L sylvian fissures into basal cisterns SAH.   MRI  pending  MRA  pending  2D Echo pending  LDL 66  HgbA1c 6.0  UDS pending   SCDs for VTE prophylaxis  aspirin 81 mg daily prior to admission, now on No antithrombotic as within 24h of tPA administration and focal SAH   Therapy recommendations:  pending   Disposition:  pending   Atrial Fibrillation, new dx  afib RVR with pauses overnight  TSH, free T4 pending  CHA2DS2-VASc Score = at least 5, ?2 oral anticoagulation recommended  Age in Years:  23-74   +1    Sex:  Female  Female   +1    Hypertension History:  yes   +1     Diabetes Mellitus:  0  Congestive Heart Failure History:  0  Vascular Disease History:  0     Stroke/TIA/Thromboembolism History:  yes   +2 . Cardiology on board, appreciate help . Cardiology is considering amiodarone drip    Acute Respiratory Failure  Intubated for IR, left intubated post IR for airway protection  Sedated with fentanyl  Not tolerating with propofol due to dropping BP  CCM onboard  Not candidate to extubate today due to cardiac pauses, vomiting, stroke with SAH  Hx of hypertension hypotension  Home meds:  HCTZ 25  Adjust Current BP goal to 120-160 given post IR and SAH . On Neo drip . Long-term BP goal normotensive  Hyperlipidemia  Home meds:  lipitor 40, resumed in hospital  LDL 66, goal < 70  Continue statin at  discharge  Prediabetes  HgbA1c 6.0, goal < 7.0  CBGs, SSI  PCP follow up  Other Stroke Risk Factors  Advanced age  Overweight, Body mass index is 27.74 kg/m., recommend weight loss, diet and exercise as appropriate   Other Active Problems  Hypothyroid on synthroid - TSH 6.781 - repeat TSH and FT4 in am  Vomited coffee ground emesis. Unable to place NG as within 24h of tPA administration. PPI bid added. Hgb 13.3 this am.   Hospital day # 0  This patient is critically ill due to stroke with MCA occlusion s/p IR, SAH, afib with RVR, cardiac pauses, hypotension and at significant risk of neurological worsening, death form recurrent stroke, vasospasm, seizure, heart failure, shock. This patient's care requires constant monitoring of vital signs, hemodynamics, respiratory and cardiac monitoring, review of multiple databases, neurological assessment, discussion with family, other specialists and medical decision making of high complexity. I spent 40 minutes of neurocritical care time in the care of this patient. I had long discussion with son at bedside, updated pt current condition, treatment plan and potential prognosis, and answered all the questions. Son expressed understanding and appreciation. I also discussed with CCM Dr. Lamonte Sakai and Cardiology Dr. Alda Lea.   Rosalin Hawking, MD PhD Stroke Neurology 07/20/2019 11:43 AM   To contact Stroke Continuity provider, please refer to [email or web address] After hours, contact General Neurology

## 2019-07-21 ENCOUNTER — Inpatient Hospital Stay (HOSPITAL_COMMUNITY): Payer: Medicare Other

## 2019-07-21 ENCOUNTER — Encounter: Payer: Self-pay | Admitting: *Deleted

## 2019-07-21 DIAGNOSIS — N179 Acute kidney failure, unspecified: Secondary | ICD-10-CM

## 2019-07-21 DIAGNOSIS — I639 Cerebral infarction, unspecified: Secondary | ICD-10-CM

## 2019-07-21 DIAGNOSIS — J9601 Acute respiratory failure with hypoxia: Secondary | ICD-10-CM

## 2019-07-21 DIAGNOSIS — I4891 Unspecified atrial fibrillation: Secondary | ICD-10-CM

## 2019-07-21 DIAGNOSIS — E7800 Pure hypercholesterolemia, unspecified: Secondary | ICD-10-CM

## 2019-07-21 DIAGNOSIS — R338 Other retention of urine: Secondary | ICD-10-CM

## 2019-07-21 LAB — BASIC METABOLIC PANEL
Anion gap: 8 (ref 5–15)
BUN: 12 mg/dL (ref 8–23)
CO2: 19 mmol/L — ABNORMAL LOW (ref 22–32)
Calcium: 8.1 mg/dL — ABNORMAL LOW (ref 8.9–10.3)
Chloride: 118 mmol/L — ABNORMAL HIGH (ref 98–111)
Creatinine, Ser: 1.43 mg/dL — ABNORMAL HIGH (ref 0.44–1.00)
GFR calc Af Amer: 43 mL/min — ABNORMAL LOW (ref >60)
GFR calc non Af Amer: 37 mL/min — ABNORMAL LOW (ref >60)
Glucose, Bld: 125 mg/dL — ABNORMAL HIGH (ref 70–99)
Potassium: 4.3 mmol/L (ref 3.5–5.1)
Sodium: 145 mmol/L (ref 135–145)

## 2019-07-21 LAB — URINALYSIS, ROUTINE W REFLEX MICROSCOPIC
Bacteria, UA: NONE SEEN
Bilirubin Urine: NEGATIVE
Glucose, UA: NEGATIVE mg/dL
Ketones, ur: NEGATIVE mg/dL
Leukocytes,Ua: NEGATIVE
Nitrite: NEGATIVE
Protein, ur: NEGATIVE mg/dL
Specific Gravity, Urine: 1.014 (ref 1.005–1.030)
pH: 6 (ref 5.0–8.0)

## 2019-07-21 LAB — GLUCOSE, CAPILLARY
Glucose-Capillary: 103 mg/dL — ABNORMAL HIGH (ref 70–99)
Glucose-Capillary: 123 mg/dL — ABNORMAL HIGH (ref 70–99)
Glucose-Capillary: 126 mg/dL — ABNORMAL HIGH (ref 70–99)
Glucose-Capillary: 130 mg/dL — ABNORMAL HIGH (ref 70–99)
Glucose-Capillary: 154 mg/dL — ABNORMAL HIGH (ref 70–99)

## 2019-07-21 LAB — RAPID URINE DRUG SCREEN, HOSP PERFORMED
Amphetamines: NOT DETECTED
Barbiturates: NOT DETECTED
Benzodiazepines: POSITIVE — AB
Cocaine: NOT DETECTED
Opiates: NOT DETECTED
Tetrahydrocannabinol: NOT DETECTED

## 2019-07-21 LAB — CBC
HCT: 39 % (ref 36.0–46.0)
Hemoglobin: 12.2 g/dL (ref 12.0–15.0)
MCH: 29.6 pg (ref 26.0–34.0)
MCHC: 31.3 g/dL (ref 30.0–36.0)
MCV: 94.7 fL (ref 80.0–100.0)
Platelets: 214 10*3/uL (ref 150–400)
RBC: 4.12 MIL/uL (ref 3.87–5.11)
RDW: 14.6 % (ref 11.5–15.5)
WBC: 9.4 10*3/uL (ref 4.0–10.5)
nRBC: 0 % (ref 0.0–0.2)

## 2019-07-21 LAB — TSH: TSH: 7.052 u[IU]/mL — ABNORMAL HIGH (ref 0.350–4.500)

## 2019-07-21 LAB — PHOSPHORUS: Phosphorus: 5.1 mg/dL — ABNORMAL HIGH (ref 2.5–4.6)

## 2019-07-21 LAB — MAGNESIUM: Magnesium: 2.5 mg/dL — ABNORMAL HIGH (ref 1.7–2.4)

## 2019-07-21 LAB — T4, FREE: Free T4: 0.8 ng/dL (ref 0.61–1.12)

## 2019-07-21 IMAGING — DX DG CHEST 1V PORT
1 series · 1 of 1 positions shown · non-contrast
Comparison: One-view chest x-ray [DATE]

CLINICAL DATA: Respiratory failure. Stroke.

EXAM:
PORTABLE CHEST 1 VIEW

[chest ap]
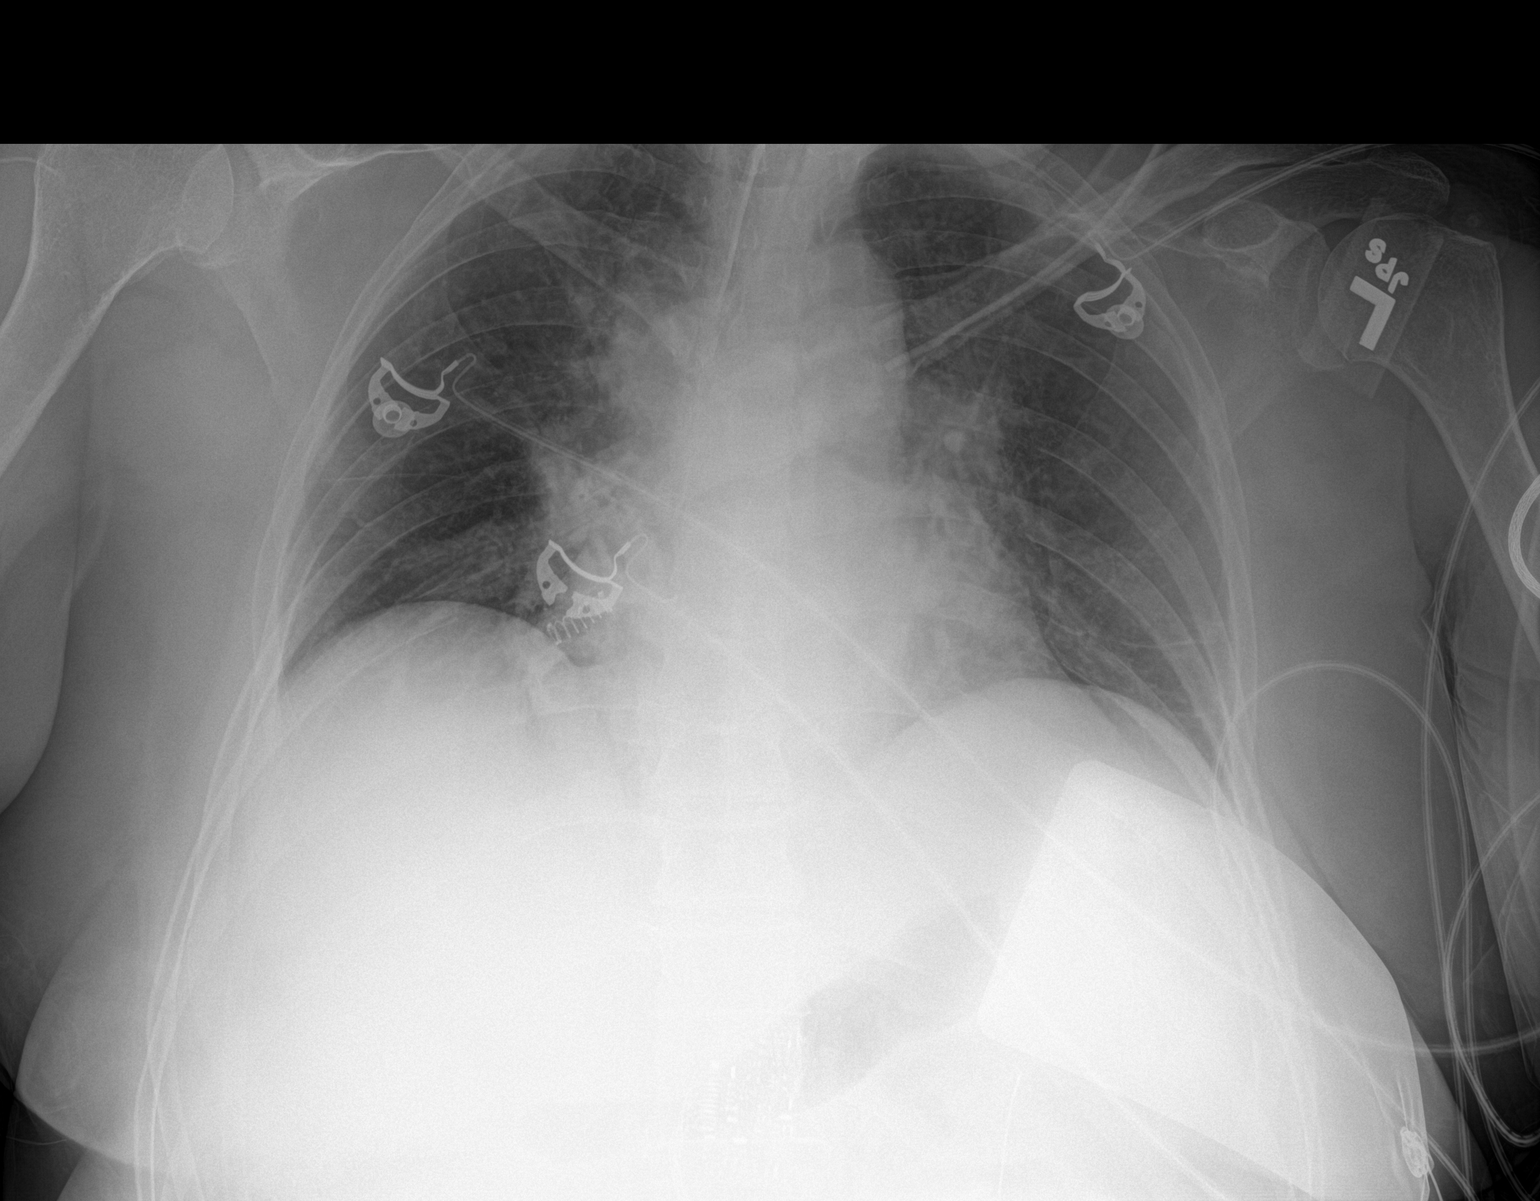

[1 of 1 positions shown; findings below may reference images not displayed]

FINDINGS: Endotracheal tube is been pulled back, now terminating at the
clavicles, well above the carina. Lung volumes remain low. Pulmonary
vascular congestion is unchanged.
IMPRESSION: 1. Repositioning of endotracheal tube now in satisfactory position.
2. Stable low lung volumes and moderate pulmonary vascular
congestion.

## 2019-07-21 MED ORDER — AMIODARONE HCL 200 MG PO TABS
200.0000 mg | ORAL_TABLET | Freq: Two times a day (BID) | ORAL | Status: DC
  Administered 2019-07-21 – 2019-07-25 (×8): 200 mg
  Filled 2019-07-21 (×8): qty 1

## 2019-07-21 MED ORDER — OSMOLITE 1.2 CAL PO LIQD
1000.0000 mL | ORAL | Status: DC
  Administered 2019-07-21 – 2019-07-23 (×3): 1000 mL
  Filled 2019-07-21 (×3): qty 1000

## 2019-07-21 MED ORDER — ATORVASTATIN CALCIUM 40 MG PO TABS
40.0000 mg | ORAL_TABLET | Freq: Every day | ORAL | Status: DC
  Administered 2019-07-21 – 2019-07-25 (×5): 40 mg
  Filled 2019-07-21 (×4): qty 1

## 2019-07-21 MED ORDER — PRO-STAT SUGAR FREE PO LIQD
30.0000 mL | Freq: Two times a day (BID) | ORAL | Status: DC
  Administered 2019-07-21 – 2019-07-25 (×9): 30 mL
  Filled 2019-07-21 (×9): qty 30

## 2019-07-21 MED ORDER — PANTOPRAZOLE SODIUM 40 MG IV SOLR
40.0000 mg | INTRAVENOUS | Status: DC
  Administered 2019-07-21 – 2019-07-24 (×4): 40 mg via INTRAVENOUS
  Filled 2019-07-21 (×4): qty 40

## 2019-07-21 MED ORDER — SODIUM CHLORIDE 0.9 % IV SOLN: INTRAVENOUS | Status: DC

## 2019-07-21 MED ORDER — LEVOTHYROXINE SODIUM 100 MCG/5ML IV SOLN
37.5000 ug | Freq: Every day | INTRAVENOUS | Status: DC
  Administered 2019-07-21 – 2019-07-25 (×5): 37.5 ug via INTRAVENOUS
  Filled 2019-07-21 (×5): qty 5

## 2019-07-21 NOTE — Progress Notes (Signed)
PT Cancellation Note  Patient Details Name: Sitara Burhop MRN: [id number] DOB: 1945-11-12   Cancelled Treatment:    Reason Eval/Treat Not Completed: Patient not medically ready - Pt remains intubated, not responsive to verbal commands. PT to check back when medically ready.   [place] Pager [phone]  Office [phone]    Roxine Caddy D Elonda Husky 07/21/2019, 9:27 AM

## 2019-07-21 NOTE — Progress Notes (Signed)
This note also relates to the following rows which could not be included: SpO2 - Cannot attach notes to unvalidated device data  RT NOTE: RT has attempted to wean patient on PS/CPAP multiple times throughout shift today. Patient only breathing 3-5 times a minute and will not adequately wean. Patient was placed in SIMV mode with RR of 10 to encourage patient to breathe over vent. Patient is not breathing over ventilator and with a RR of 10 and VT of 400 patient is barely maintaining minute ventilation of around 4 liters. Patient placed back on previous settings: mode PRVC, RR 15, VT 400, PEEP 5 and 40% FIO2. Vitals are stable. RT will continue to monitor.

## 2019-07-21 NOTE — Procedures (Signed)
Cortrak  Person Inserting Tube:  King, Nahome Bublitz E, RD Tube Type:  Cortrak - 43 inches Tube Location:  Left nare Initial Placement:  Stomach Secured by: Bridle Technique Used to Measure Tube Placement:  Documented cm marking at nare/ corner of mouth Cortrak Secured At:  65 cm    Cortrak Tube Team Note:  Consult received to place a Cortrak feeding tube.   No x-ray is required. RN may begin using tube.    If the tube becomes dislodged please keep the tube and contact the Cortrak team at [email or web address] (password [email or web address]) for replacement.  If after hours and replacement cannot be delayed, place a NG tube and confirm placement with an abdominal x-ray.    Wanell Lorenzi King, MS, RD, LDN Pager number available on Amion 

## 2019-07-21 NOTE — Progress Notes (Signed)
OT Cancellation Note  Patient Details Name: Jamie Mitchell MRN: [id number] DOB: 05-Nov-1945   Cancelled Treatment:    Reason Eval/Treat Not Completed: Medical issues which prohibited therapy(Intubated and not responding to commands. RN request hold.) Will return as schedule allows.   [place], OTR/L Acute Rehab Pager: [phone] Office: [phone] 07/21/2019, 10:20 AM

## 2019-07-21 NOTE — Progress Notes (Signed)
STROKE TEAM PROGRESS NOTE   INTERVAL HISTORY Patient still intubated, off sedation.  Awake alert, eyes open.  Interpreter at bedside, patient only follow commands of closed/open eyes, but no any other commands.  Right upper and lower extremity muscle strength still improving.  Son at bedside, answered all his questions.  Discussed with CCM, patient did not initiate breath when put on weaning mode. Not able to extubate at this time, will continue to try in the pm.   Vitals:   07/21/19 0800 07/21/19 0844 07/21/19 0900 07/21/19 1054  BP: 125/87  118/62   Pulse: 79  87   Resp: 13  14   Temp:   98.9 F (37.2 C)   TempSrc:   Axillary   SpO2: 98% 99% 94% 98%  Weight:      Height:       CBC:  Recent Labs  Lab 07/19/19 2300 07/19/19 2305 07/20/19 0356 07/20/19 0426 07/20/19 0901 07/21/19 0405  WBC 6.9   < > 9.2  --   --  9.4  NEUTROABS 2.7  --   --   --   --   --   HGB 14.8   < > 13.7   < > 13.3 12.2  HCT 45.4   < > 42.3   < > 39.0 39.0  MCV 92.5   < > 91.6  --   --  94.7  PLT 256   < > 264  --   --  214   < > = values in this interval not displayed.   Basic Metabolic Panel:  Recent Labs  Lab 07/20/19 0356 07/20/19 0426 07/20/19 1921 07/21/19 0405  NA 137   < > 142 145  K 4.0   < > 4.2 4.3  CL 110   < > 115* 118*  CO2 20*   < > 17* 19*  GLUCOSE 192*   < > 129* 125*  BUN 14   < > 11 12  CREATININE 0.82   < > 0.88 1.43*  CALCIUM 7.7*   < > 8.3* 8.1*  MG 1.9  --  3.0*  --   PHOS 3.9  --   --   --    < > = values in this interval not displayed.   Lipid Panel:     Component Value Date/Time   CHOL 113 07/20/2019 0356   CHOL 137 02/26/2017 1035   TRIG 57 07/20/2019 0356   HDL 36 (L) 07/20/2019 0356   HDL 35 (L) 02/26/2017 1035   CHOLHDL 3.1 07/20/2019 0356   VLDL 11 07/20/2019 0356   LDLCALC 66 07/20/2019 0356   LDLCALC 49 02/26/2017 1035   HgbA1c:  Lab Results  Component Value Date   HGBA1C 6.1 (H) 07/20/2019   HGBA1C 6.0 (H) 07/20/2019   Urine Drug Screen:      Component Value Date/Time   LABOPIA NONE DETECTED 07/21/2019 0058   COCAINSCRNUR NONE DETECTED 07/21/2019 0058   LABBENZ POSITIVE (A) 07/21/2019 0058   AMPHETMU NONE DETECTED 07/21/2019 0058   THCU NONE DETECTED 07/21/2019 0058   LABBARB NONE DETECTED 07/21/2019 0058    Alcohol Level     Component Value Date/Time   ETH <10 07/19/2019 2300    IMAGING past 48 hours CT Code Stroke CTA Head W/WO contrast  Result Date: 07/19/2019 CLINICAL DATA:  Initial evaluation for acute right-sided weakness. EXAM: CT ANGIOGRAPHY HEAD AND NECK TECHNIQUE: Multidetector CT imaging of the head and neck was performed using the standard protocol during  bolus administration of intravenous contrast. Multiplanar CT image reconstructions and MIPs were obtained to evaluate the vascular anatomy. Carotid stenosis measurements (when applicable) are obtained utilizing NASCET criteria, using the distal internal carotid diameter as the denominator. CONTRAST:  69m OMNIPAQUE IOHEXOL 350 MG/ML SOLN COMPARISON:  Prior head CT from earlier same day. FINDINGS: CTA NECK FINDINGS Aortic arch: Visualized aortic arch normal in caliber with normal branch pattern. Bovine branching pattern with common origin of the right brachiocephalic and left common carotid artery noted. No hemodynamically significant stenosis seen about the origin of the great vessels. Subclavian arteries widely patent. Right carotid system: Right common and internal carotid arteries widely patent to the skull base without stenosis, dissection or occlusion. Proximal right ICA medialized into the retropharyngeal space. Focal tortuosity the distal cervical right ICA noted. Left carotid system: Left common and internal carotid arteries widely patent to the skull base without stenosis, dissection or occlusion. Proximal left ICA medialized into the retropharyngeal space. Evaluation the distal cervical left ICA limited by motion. Vertebral arteries: Both vertebral arteries  arise from the subclavian arteries. Vertebral arteries mildly tortuous but widely patent within the neck without stenosis, dissection or occlusion. Skeleton: No acute osseous abnormality. No discrete lytic or blastic osseous lesions. Prominent degenerative changes noted at the C1-2 articulation, greater on the right. Other neck: No other acute soft tissue abnormality within the neck. No mass lesion or adenopathy. Upper chest: Mild scattered atelectatic changes noted within the visualized lungs. Visualized upper chest demonstrates no acute finding. Review of the MIP images confirms the above findings CTA HEAD FINDINGS Anterior circulation: Examination mildly degraded by motion artifact. Petrous, cavernous, and supraclinoid ICAs widely patent without flow-limiting stenosis. ICA termini well perfused. A1 segments patent bilaterally. Normal anterior communicating artery complex. Anterior cerebral arteries widely patent to their distal aspects without stenosis. Right M1 widely patent. Normal right MCA bifurcation. Distal right MCA branches well perfused. Left M1 widely patent. Normal left MCA bifurcation. Occlusive thrombus seen within a proximal left M2 branch, superior division (series 7, image 94), acute in nature. Attenuated opacification of left MCA branches distally, either due to subocclusive thrombus and/or moderate collateralization. Remainder of the left MCA branches remain patent. Posterior circulation: Vertebral arteries grossly patent to the vertebrobasilar junction without appreciable stenosis, although evaluation limited by motion. Neither PICA well visualized. Basilar diminutive but patent to its distal aspect without appreciable stenosis. Superior cerebral arteries patent bilaterally. Predominant fetal type origin of both PCAs, supplied via widely patent and robust posterior communicating arteries. Both PCAs well perfused to their distal aspects. Venous sinuses: Grossly patent allowing for timing the  contrast bolus. Anatomic variants: Fetal type origin of the PCAs with overall diminutive vertebrobasilar system. No appreciable aneurysm. Review of the MIP images confirms the above findings IMPRESSION: 1. Positive CTA for emergent large vessel occlusion, with occlusive thrombus within a proximal left M2 branch, superior division. Attenuated flow distally either due to subocclusive thrombus and/or collateralization. 2. Diffuse tortuosity about the major arterial vasculature of the head and neck, suggesting chronic underlying hypertension. No other hemodynamically significant or correctable stenosis. 3. Fetal type origin of the PCAs with overall diminutive vertebrobasilar system. Critical Value/emergent results were called by telephone at the time of interpretation on 07/19/2019 at 11:26 pm [facility], who verbally acknowledged these results. Electronically Signed   By: BJeannine BogaM.D.   On: 07/19/2019 23:53   DG Chest 1 View  Result Date: 07/20/2019 CLINICAL DATA:  Intubation EXAM: CHEST  1 VIEW  COMPARISON:  None. FINDINGS: Endotracheal tube tip is positioned within the proximal right mainstem bronchus. Heart size is mildly enlarged. No focal airspace consolidation. No pleural effusion or pneumothorax. IMPRESSION: 1. Endotracheal tube tip within the proximal right mainstem bronchus. Recommend retraction approximately 3.0 cm. 2. Mild cardiomegaly. These results will be called to the ordering clinician or representative by the Radiologist Assistant, and communication documented in the PACS or zVision Dashboard. Electronically Signed   By: Davina Poke D.O.   On: 07/20/2019 10:36   CT HEAD WO CONTRAST  Result Date: 07/20/2019 CLINICAL DATA:  Follow-up stroke EXAM: CT HEAD WITHOUT CONTRAST TECHNIQUE: Contiguous axial images were obtained from the base of the skull through the vertex without intravenous contrast. COMPARISON:  Head CT earlier same day. FINDINGS: Brain: Acute infarction  of the left frontal operculum, anterior insular and subinsular brain as shown previously. Low-density and swelling but no evidence of further extension of the infarction. Small amount of subarachnoid hemorrhage at the base of the brain and within the sylvian fissure on the left is becoming slightly less dense. No evidence of additional or worsening hemorrhage. No significant mass effect. One or 2 mm of left-to-right midline shift at the most. No other area of acute or subacute infarction. 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 No hydrocephalus. No extra-axial collection. No intraparenchymal hemorrhage in the region of infarction. Vascular: No new primary vascular finding. Skull: Negative Sinuses/Orbits: Clear/normal Other: None IMPRESSION: Subarachnoid hemorrhage at the base of the brain and sylvian fissure on the left is becoming less dense. No evidence of additional bleeding. Well-circumscribed infarction in the left frontal operculum, insula and subinsular brain as seen previously. No evidence of infarct extension. Mild regional swelling with left-to-right shift of 1-2 mm. No infarct hematoma. Electronically Signed   By: Nelson Chimes M.D.   On: 07/20/2019 21:53   CT HEAD WO CONTRAST  Result Date: 07/20/2019 CLINICAL DATA:  Stroke, follow-up. EXAM: CT HEAD WITHOUT CONTRAST TECHNIQUE: Contiguous axial images were obtained from the base of the skull through the vertex without intravenous contrast. COMPARISON:  Postprocedure head CT 07/20/2019, noncontrast head CT and CT angiogram head/neck 07/19/2019 FINDINGS: Brain: Mildly motion degraded examination. There is residual circulating contrast. There has been continued further demarcation of an acute ischemic infarct centered within the anterolateral left frontal lobe with involvement of the left frontal operculum as well as left insula and subinsular region. There may be subtle petechial hemorrhage in  the infarction territory. There is no significant mass effect at this time. No midline shift. Small to moderate volume acute subarachnoid hemorrhage within the left sylvian fissure and extending to the basal cisterns does not appear significantly changed as compared postprocedural head CT 07/19/2019. No evidence of hydrocephalus. Vascular: Circulating contrast material limits evaluation for abnormal vascular hyperdensity. Skull: Normal. Negative for fracture or focal lesion. Sinuses/Orbits: Visualized orbits demonstrate no acute abnormality. Mild right maxillary sinus mucosal thickening. Redemonstrated left middle ear/mastoid effusion. IMPRESSION: Continued further demarcation of an acute ischemic infarct centered within the anterolateral left frontal lobe with involvement of the left frontal operculum as well as left insula and subinsular region. There may be mild petechial hemorrhage in the infarction territory. No significant mass effect at this time. Small to moderate volume acute subarachnoid hemorrhage within the left sylvian fissure and extending to the basal cisterns, not significantly changed from postprocedural head CT 07/19/2019. No evidence of hydrocephalus. Electronically Signed   By: Kellie Simmering DO   On: 07/20/2019 09:10  CT Code Stroke CTA Neck W/WO contrast  Result Date: 07/19/2019 CLINICAL DATA:  Initial evaluation for acute right-sided weakness. EXAM: CT ANGIOGRAPHY HEAD AND NECK TECHNIQUE: Multidetector CT imaging of the head and neck was performed using the standard protocol during bolus administration of intravenous contrast. Multiplanar CT image reconstructions and MIPs were obtained to evaluate the vascular anatomy. Carotid stenosis measurements (when applicable) are obtained utilizing NASCET criteria, using the distal internal carotid diameter as the denominator. CONTRAST:  56m OMNIPAQUE IOHEXOL 350 MG/ML SOLN COMPARISON:  Prior head CT from earlier same day. FINDINGS: CTA NECK  FINDINGS Aortic arch: Visualized aortic arch normal in caliber with normal branch pattern. Bovine branching pattern with common origin of the right brachiocephalic and left common carotid artery noted. No hemodynamically significant stenosis seen about the origin of the great vessels. Subclavian arteries widely patent. Right carotid system: Right common and internal carotid arteries widely patent to the skull base without stenosis, dissection or occlusion. Proximal right ICA medialized into the retropharyngeal space. Focal tortuosity the distal cervical right ICA noted. Left carotid system: Left common and internal carotid arteries widely patent to the skull base without stenosis, dissection or occlusion. Proximal left ICA medialized into the retropharyngeal space. Evaluation the distal cervical left ICA limited by motion. Vertebral arteries: Both vertebral arteries arise from the subclavian arteries. Vertebral arteries mildly tortuous but widely patent within the neck without stenosis, dissection or occlusion. Skeleton: No acute osseous abnormality. No discrete lytic or blastic osseous lesions. Prominent degenerative changes noted at the C1-2 articulation, greater on the right. Other neck: No other acute soft tissue abnormality within the neck. No mass lesion or adenopathy. Upper chest: Mild scattered atelectatic changes noted within the visualized lungs. Visualized upper chest demonstrates no acute finding. Review of the MIP images confirms the above findings CTA HEAD FINDINGS Anterior circulation: Examination mildly degraded by motion artifact. Petrous, cavernous, and supraclinoid ICAs widely patent without flow-limiting stenosis. ICA termini well perfused. A1 segments patent bilaterally. Normal anterior communicating artery complex. Anterior cerebral arteries widely patent to their distal aspects without stenosis. Right M1 widely patent. Normal right MCA bifurcation. Distal right MCA branches well perfused. Left  M1 widely patent. Normal left MCA bifurcation. Occlusive thrombus seen within a proximal left M2 branch, superior division (series 7, image 94), acute in nature. Attenuated opacification of left MCA branches distally, either due to subocclusive thrombus and/or moderate collateralization. Remainder of the left MCA branches remain patent. Posterior circulation: Vertebral arteries grossly patent to the vertebrobasilar junction without appreciable stenosis, although evaluation limited by motion. Neither PICA well visualized. Basilar diminutive but patent to its distal aspect without appreciable stenosis. Superior cerebral arteries patent bilaterally. Predominant fetal type origin of both PCAs, supplied via widely patent and robust posterior communicating arteries. Both PCAs well perfused to their distal aspects. Venous sinuses: Grossly patent allowing for timing the contrast bolus. Anatomic variants: Fetal type origin of the PCAs with overall diminutive vertebrobasilar system. No appreciable aneurysm. Review of the MIP images confirms the above findings IMPRESSION: 1. Positive CTA for emergent large vessel occlusion, with occlusive thrombus within a proximal left M2 branch, superior division. Attenuated flow distally either due to subocclusive thrombus and/or collateralization. 2. Diffuse tortuosity about the major arterial vasculature of the head and neck, suggesting chronic underlying hypertension. No other hemodynamically significant or correctable stenosis. 3. Fetal type origin of the PCAs with overall diminutive vertebrobasilar system. Critical Value/emergent results were called by telephone at the time of interpretation on 07/19/2019 at 11:26 pm to  provider ERIC LINDZEN , who verbally acknowledged these results. Electronically Signed   By: Jeannine Boga M.D.   On: 07/19/2019 23:53   IR CT Head Ltd  Result Date: 07/20/2019 INDICATION: 74 year old female with past medical history significant for  hypothyroidism presenting as a code stroke with right-sided hemiplegia and new onset atrial fibrillation. Her last known well was 21:50 on 07/19/2019. Head CT showed loss of gray-white differentiation in the left frontal for colon extending to the anterior aspect of the insula with no hemorrhage. CT angiogram showed a left M2/MCA/superior division branch occlusion. NIHSS 25. EXAM: Diagnostic cerebral angiogram Mechanical thrombectomy Flat panel head CT COMPARISON:  CT/CT angiogram of the head and neck July 19, 2019 MEDICATIONS: 5 mg of problem you have intra arterial to the left ICA. ANESTHESIA/SEDATION: General anesthesia CONTRAST:  75 mL FLUOROSCOPY TIME:  Fluoroscopy Time: 55 minutes 18 seconds (1,796 mGy). COMPLICATIONS: Moderate left sylvian subarachnoid hemorrhage TECHNIQUE: Informed written consent was obtained from the patient's son after a thorough discussion of the procedural risks, benefits and alternatives. All questions were addressed. Maximal Sterile Barrier Technique was utilized including caps, mask, sterile gowns, sterile gloves, sterile drape, hand hygiene and skin antiseptic. A timeout was performed prior to the initiation of the procedure. Using a micropuncture kit and modified Seldinger technique, access was gained to the right common femoral artery and an 8 French sheath was placed in the right common femoral artery. Under fluoroscopy, an 8 [place] Walrus balloon guide catheter was navigated over a 6 range Berenstein 2 catheter and a 0.035 inch Terumo Glidewire into the aortic arch. Under fluoroscopy, the catheter was advanced into the cervical right internal carotid artery. The wire and 6 French catheter were removed and angiograms of the head were obtained in frontal and lateral views. FINDINGS: There is a left M2/MCA superior division branch occlusion. Prominent tortuosity of the cervical left ICA and left carotid siphon. PROCEDURE: A large bore catheter aspiration catheter was navigated  through the balloon guide catheter and over a phenom 21 microcatheter and a synchro2 support microguidewire into the cavernous segment of the left ICA. The microcatheter was then advanced into the left M2/MCA superior division branch, distal to the point of occlusion. Frontal and lateral angiograms were obtained with microcatheter contrast injection. A 4 x 40 mm solitaire stent retriever was subsequently deployed spanning the distal left M1-mid M2. The device was allowed to intercalated with the clot for 4 minutes. The microcatheter was removed. The guiding catheter balloon was inflated and constant aspiration was performed, the aspiration catheter was navigated near the occlusion and connected to a penumbra aspiration pump. The stent retriever was then retracted along with the aspiration catheter. Frontal and lateral angiograms were obtained showing persistent left M2/MCA occlusion. In a similar fashion and using the same platform, a second stent retriever pass was performed with the solitaire device. Frontal and lateral angiograms were obtained showing persistent left M2/MCA occlusion with the clot migrating proximally. A catalyst 6 aspiration catheter was navigated through the balloon guide catheter and over a phenom 21 microcatheter and posterior 0.014 inch Aristotle microguidewire into the cavernous segment of the left ICA. The microcatheter was then advanced into the left M2/MCA superior division branch, distal to the point of occlusion. Frontal and lateral angiograms were obtained with microcatheter contrast injection. A 4 x 41 mm trevo stent retriever was subsequently deployed spanning the distal left M1-mid M2. The device was allowed to intercalated with the clot for 4 minutes. The microcatheter was removed. The  guiding catheter balloon was inflated and constant aspiration was performed, the aspiration catheter was navigated near the occlusion and connected to a penumbra aspiration pump. The stent retriever  was then retracted along with the aspiration catheter. Frontal and lateral angiograms were obtained showing distal contrast penetration into the left M2/MCA superior division branch with severe vasospasm. Intra arterial infusion of 5 mg of verapamil was performed over 10 minutes. Follow-up angiogram showed reocclusion of the left M2/MCA superior division branch. A 3 max aspiration catheter was navigated through the balloon guide catheter and over the Aristotle microguidewire into the left and 2/MCA superior division branch at the level of occlusion. Continuous is aspiration was performed for 4 minutes. The guiding catheter balloon was inflated and the aspiration catheter was then removed. Follow-up left ICA angiograms with frontal and lateral views showed recanalization of the left M2/MCA superior division branch with slow flow in the distal angular branch (TICI 2C). A flat panel CT of the head was then obtained. Moderate left sylvian subarachnoid hemorrhage was identified. The catheter was then removed. A right common femoral artery angiogram was obtained via sheath side port. A 6 French Perclose ProGlide was utilized for right, femoral access closure. Immediate hemostasis was achieved. Patient was transferred to ICU for continued monitoring. Family and neurology team communicated immediately after the end of the procedure. IMPRESSION: 1. Left M2/MCA superior division branch occlusion. 2. Successful mechanical thrombectomy/aspiration performed with TICI 2C final recanalization. A total of 3 stent retriever passes and 1 aspiration pass performed. 3. Moderate size left sylvian subarachnoid hemorrhage seen on postprocedure flat panel CT. PLAN: Continued monitoring in ICU level of care. Repeat head CT within 4 hours to evaluate for subarachnoid hemorrhage stability. Electronically Signed   By: Pedro Earls M.D.   On: 07/20/2019 09:27   DG Chest Port 1 View  Result Date: 07/21/2019 CLINICAL DATA:   Respiratory failure. Stroke. EXAM: PORTABLE CHEST 1 VIEW COMPARISON:  One-view chest x-ray 07/19/2018 FINDINGS: Endotracheal tube is been pulled back, now terminating at the clavicles, well above the carina. Lung volumes remain low. Pulmonary vascular congestion is unchanged. IMPRESSION: 1. Repositioning of endotracheal tube now in satisfactory position. 2. Stable low lung volumes and moderate pulmonary vascular congestion. Electronically Signed   By: San Morelle M.D.   On: 07/21/2019 08:36   ECHOCARDIOGRAM COMPLETE  Result Date: 07/20/2019    ECHOCARDIOGRAM REPORT   Patient Name:   Jamie Mitchell Date of Exam: 07/20/2019 Medical Rec #:  [id number]               Height:       62.0 in Accession #:    [id number]              Weight:       151.7 lb Date of Birth:  [birth year]              BSA:          1.700 m Patient Age:    27 years                BP:           124/68 mmHg Patient Gender: F                       HR:           79 bpm. Exam Location:  Inpatient Procedure: 2D Echo, Cardiac Doppler and Color Doppler STAT ECHO Indications:  CVA  History:        Patient has no prior history of Echocardiogram examinations. PAD                 and Stroke, Arrythmias:Atrial Fibrillation and Tachy-Brady; Risk                 Factors:Hypertension and Dyslipidemia.  Sonographer:    Dustin Flock Referring Phys: [place]  1. Left ventricular ejection fraction, by estimation, is 60 to 65%. The left ventricle has normal function. The left ventricle has no regional wall motion abnormalities. Left ventricular diastolic parameters were normal.  2. Right ventricular systolic function is normal. The right ventricular size is normal. Tricuspid regurgitation signal is inadequate for assessing PA pressure.  3. The mitral valve is normal in structure and function. Mild mitral valve regurgitation. No evidence of mitral stenosis.  4. The aortic valve is normal in structure and function. Aortic  valve regurgitation is not visualized. No aortic stenosis is present.  5. The inferior vena cava is normal in size with greater than 50% respiratory variability, suggesting right atrial pressure of 3 mmHg. FINDINGS  Left Ventricle: Left ventricular ejection fraction, by estimation, is 60 to 65%. The left ventricle has normal function. The left ventricle has no regional wall motion abnormalities. The left ventricular internal cavity size was normal in size. There is  no left ventricular hypertrophy. Left ventricular diastolic parameters were normal. Normal left ventricular filling pressure. Right Ventricle: The right ventricular size is normal. No increase in right ventricular wall thickness. Right ventricular systolic function is normal. Tricuspid regurgitation signal is inadequate for assessing PA pressure. Left Atrium: Left atrial size was normal in size. Right Atrium: Right atrial size was normal in size. Pericardium: There is no evidence of pericardial effusion. Mitral Valve: The mitral valve is normal in structure and function. Normal mobility of the mitral valve leaflets. Mild mitral valve regurgitation. No evidence of mitral valve stenosis. Tricuspid Valve: The tricuspid valve is normal in structure. Tricuspid valve regurgitation is trivial. No evidence of tricuspid stenosis. Aortic Valve: The aortic valve is normal in structure and function. Aortic valve regurgitation is not visualized. No aortic stenosis is present. Pulmonic Valve: The pulmonic valve was normal in structure. Pulmonic valve regurgitation is not visualized. No evidence of pulmonic stenosis. Aorta: The aortic root is normal in size and structure. Venous: IVC assessment for right atrial pressure unable to be performed due to mechanical ventilation. The inferior vena cava is normal in size with greater than 50% respiratory variability, suggesting right atrial pressure of 3 mmHg. IAS/Shunts: No atrial level shunt detected by color flow Doppler.   LEFT VENTRICLE PLAX 2D LVIDd:         4.50 cm  Diastology LVIDs:         2.80 cm  LV e' lateral:   11.30 cm/s LV PW:         1.10 cm  LV E/e' lateral: 9.5 LV IVS:        1.20 cm  LV e' medial:    9.68 cm/s LVOT diam:     2.00 cm  LV E/e' medial:  11.1 LV SV:         61 LV SV Index:   36 LVOT Area:     3.14 cm  RIGHT VENTRICLE RV Basal diam:  2.90 cm RV S prime:     13.80 cm/s TAPSE (M-mode): 3.3 cm LEFT ATRIUM  Index       RIGHT ATRIUM           Index LA diam:        4.50 cm 2.65 cm/m  RA Area:     11.90 cm LA Vol (A2C):   45.3 ml 26.65 ml/m RA Volume:   26.60 ml  15.65 ml/m LA Vol (A4C):   40.5 ml 23.83 ml/m LA Biplane Vol: 45.7 ml 26.89 ml/m  AORTIC VALVE LVOT Vmax:   84.90 cm/s LVOT Vmean:  52.600 cm/s LVOT VTI:    0.195 m  AORTA Ao Root diam: 2.60 cm MITRAL VALVE MV Area (PHT): 3.37 cm     SHUNTS MV Decel Time: 225 msec     Systemic VTI:  0.20 m MV E velocity: 107.00 cm/s  Systemic Diam: 2.00 cm MV A velocity: 66.80 cm/s MV E/A ratio:  1.60 Fransico Him MD Electronically signed by Fransico Him MD Signature Date/Time: 07/20/2019/12:37:18 PM    Final    IR PERCUTANEOUS ART THROMBECTOMY/INFUSION INTRACRANIAL INC DIAG ANGIO  Result Date: 07/20/2019 INDICATION: 74 year old female with past medical history significant for hypothyroidism presenting as a code stroke with right-sided hemiplegia and new onset atrial fibrillation. Her last known well was 21:50 on 07/19/2019. Head CT showed loss of gray-white differentiation in the left frontal for colon extending to the anterior aspect of the insula with no hemorrhage. CT angiogram showed a left M2/MCA/superior division branch occlusion. NIHSS 25. EXAM: Diagnostic cerebral angiogram Mechanical thrombectomy Flat panel head CT COMPARISON:  CT/CT angiogram of the head and neck July 19, 2019 MEDICATIONS: 5 mg of problem you have intra arterial to the left ICA. ANESTHESIA/SEDATION: General anesthesia CONTRAST:  75 mL FLUOROSCOPY TIME:  Fluoroscopy  Time: 55 minutes 18 seconds (1,796 mGy). COMPLICATIONS: Moderate left sylvian subarachnoid hemorrhage TECHNIQUE: Informed written consent was obtained from the patient's son after a thorough discussion of the procedural risks, benefits and alternatives. All questions were addressed. Maximal Sterile Barrier Technique was utilized including caps, mask, sterile gowns, sterile gloves, sterile drape, hand hygiene and skin antiseptic. A timeout was performed prior to the initiation of the procedure. Using a micropuncture kit and modified Seldinger technique, access was gained to the right common femoral artery and an 8 French sheath was placed in the right common femoral artery. Under fluoroscopy, an 8 [place] Walrus balloon guide catheter was navigated over a 6 range Berenstein 2 catheter and a 0.035 inch Terumo Glidewire into the aortic arch. Under fluoroscopy, the catheter was advanced into the cervical right internal carotid artery. The wire and 6 French catheter were removed and angiograms of the head were obtained in frontal and lateral views. FINDINGS: There is a left M2/MCA superior division branch occlusion. Prominent tortuosity of the cervical left ICA and left carotid siphon. PROCEDURE: A large bore catheter aspiration catheter was navigated through the balloon guide catheter and over a phenom 21 microcatheter and a synchro2 support microguidewire into the cavernous segment of the left ICA. The microcatheter was then advanced into the left M2/MCA superior division branch, distal to the point of occlusion. Frontal and lateral angiograms were obtained with microcatheter contrast injection. A 4 x 40 mm solitaire stent retriever was subsequently deployed spanning the distal left M1-mid M2. The device was allowed to intercalated with the clot for 4 minutes. The microcatheter was removed. The guiding catheter balloon was inflated and constant aspiration was performed, the aspiration catheter was navigated near the  occlusion and connected to a penumbra aspiration pump. The stent retriever  was then retracted along with the aspiration catheter. Frontal and lateral angiograms were obtained showing persistent left M2/MCA occlusion. In a similar fashion and using the same platform, a second stent retriever pass was performed with the solitaire device. Frontal and lateral angiograms were obtained showing persistent left M2/MCA occlusion with the clot migrating proximally. A catalyst 6 aspiration catheter was navigated through the balloon guide catheter and over a phenom 21 microcatheter and posterior 0.014 inch Aristotle microguidewire into the cavernous segment of the left ICA. The microcatheter was then advanced into the left M2/MCA superior division branch, distal to the point of occlusion. Frontal and lateral angiograms were obtained with microcatheter contrast injection. A 4 x 41 mm trevo stent retriever was subsequently deployed spanning the distal left M1-mid M2. The device was allowed to intercalated with the clot for 4 minutes. The microcatheter was removed. The guiding catheter balloon was inflated and constant aspiration was performed, the aspiration catheter was navigated near the occlusion and connected to a penumbra aspiration pump. The stent retriever was then retracted along with the aspiration catheter. Frontal and lateral angiograms were obtained showing distal contrast penetration into the left M2/MCA superior division branch with severe vasospasm. Intra arterial infusion of 5 mg of verapamil was performed over 10 minutes. Follow-up angiogram showed reocclusion of the left M2/MCA superior division branch. A 3 max aspiration catheter was navigated through the balloon guide catheter and over the Aristotle microguidewire into the left and 2/MCA superior division branch at the level of occlusion. Continuous is aspiration was performed for 4 minutes. The guiding catheter balloon was inflated and the aspiration catheter  was then removed. Follow-up left ICA angiograms with frontal and lateral views showed recanalization of the left M2/MCA superior division branch with slow flow in the distal angular branch (TICI 2C). A flat panel CT of the head was then obtained. Moderate left sylvian subarachnoid hemorrhage was identified. The catheter was then removed. A right common femoral artery angiogram was obtained via sheath side port. A 6 French Perclose ProGlide was utilized for right, femoral access closure. Immediate hemostasis was achieved. Patient was transferred to ICU for continued monitoring. Family and neurology team communicated immediately after the end of the procedure. IMPRESSION: 1. Left M2/MCA superior division branch occlusion. 2. Successful mechanical thrombectomy/aspiration performed with TICI 2C final recanalization. A total of 3 stent retriever passes and 1 aspiration pass performed. 3. Moderate size left sylvian subarachnoid hemorrhage seen on postprocedure flat panel CT. PLAN: Continued monitoring in ICU level of care. Repeat head CT within 4 hours to evaluate for subarachnoid hemorrhage stability. Electronically Signed   By: Pedro Earls M.D.   On: 07/20/2019 09:27   CT HEAD CODE STROKE WO CONTRAST  Result Date: 07/19/2019 CLINICAL DATA:  Code stroke. Initial evaluation for right-sided facial droop with right-sided weakness. EXAM: CT HEAD WITHOUT CONTRAST TECHNIQUE: Contiguous axial images were obtained from the base of the skull through the vertex without intravenous contrast. COMPARISON:  None. FINDINGS: Brain: Mild age-related cerebral atrophy. Small focus of encephalomalacia involving the cortical/subcortical high left frontal lobe consistent with a small chronic left MCA territory infarct (series 3, image 25). Few scattered parenchymal calcifications noted at the right frontal and left parietal region. There is subtle loss of gray-white matter differentiation involving the region of the left  frontal operculum, suspicious for early/developing acute left MCA territory infarct. Left insula relatively well maintained at this time as are the deep gray nuclei. No intracranial hemorrhage or mass effect. No other  acute large vessel territory infarct. No mass lesion or midline shift. No hydrocephalus. No extra-axial fluid collection. Vascular: Asymmetric hyperdensity seen M2 branch (series 6, image 45), suspicious for LV of. Of distal left M1 and/or proximal Skull: Scalp soft tissues and calvarium within normal limits. Sinuses/Orbits: Left gaze noted. Globes and orbital soft tissues otherwise unremarkable. Mild mucosal thickening noted within the right sphenoid sinus. Chronic appearing left mastoid and middle ear effusion noted. Visualized nasopharynx within normal limits. Other: None. ASPECTS [facility] Stroke Program Early CT Score) - Ganglionic level infarction (caudate, lentiform nuclei, internal capsule, insula, M1-M3 cortex): 7 - Supraganglionic infarction (M4-M6 cortex): 2 Total score (0-10 with 10 being normal): 9 IMPRESSION: 1. Subtle loss of gray-white matter differentiation involving the left frontal operculum, concerning for acute left MCA territory infarct. Hyperdensity at the level of a distal left M1/proximal M2 branch concerning for LVO. Further assessment with dedicated CTA recommended. No intracranial hemorrhage. 2. ASPECTS is 9. 3. Small chronic high left frontal infarct. Critical Value/emergent results were called by telephone at the time of interpretation on 07/19/2019 at 11:10 pm to provider [facility] , who verbally acknowledged these results. Electronically Signed   By: Jeannine Boga M.D.   On: 07/19/2019 23:35    PHYSICAL EXAM   Temp:  [98.7 F (37.1 C)-98.9 F (37.2 C)] 98.9 F (37.2 C) (02/26 0900) Pulse Rate:  [44-145] 87 (02/26 0900) Resp:  [13-20] 14 (02/26 0900) BP: (83-150)/(53-123) 118/62 (02/26 0900) SpO2:  [93 %-100 %] 98 % (02/26 1054) Arterial Line BP:  (82-122)/(73-94) 86/80 (02/26 0900) FiO2 (%):  [40 %-50 %] 40 % (02/26 1054)  General - Well nourished, well developed, intubated off sedation.  Ophthalmologic - fundi not visualized due to noncooperation.  Cardiovascular - irregularly irregular heart rate and rhythm wheeze intermittent RVR.  Neuro - intubated off sedation, eyes open, only able to follow commands of eye closure/opening, did not follow any other commands.  Left gaze preference, but able to cross midline with intermittent right gaze. Blinking to visual threat on the left but not on the right, PERRL. Corneal reflex present, gag and cough present. Breathing over the vent.  Facial symmetry not able to test due to ET tube.  Tongue protrusion not cooperative. On pain stimulation, RUE 3/5 with drift and RLE 3+/5. LUE and LLE spontaneous movement against gravity. DTR 1+ and no babinski. Sensation, coordination and gait not tested.   ASSESSMENT/PLAN Ms. Jamie Mitchell is a 74 y.o. female with history of HTN, HLD and thyroid disease presenting with R hemiparesis, Left gaze preference, R facial weakness, R field cut and global aphasia. AF noted by EMS. Received tPA 07/19/2019 at 2317. Found to have  L M2 occlusion and taken to IR.   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 Old high L frontal infarct. ASPECTS 9.    CTA head & neck L M2 LVO. Diffuse tortuosity head and neck. Fetal PCAs w/ small VB system.   Cerebral angio TICI2c revascularization LM2 superior division branch occlusion. Moderate L sylvian SAH   CT head post IR anterolateral L frontal lobe infarct in operculum, insula and subinsular region. Small to moderate L sylvian fissures into basal cisterns SAH.   Repeat CT head L sylvian fissure SAH less  dense. L frontal  operculum, insula and subinsular infarct well seen. Mild 1-70m midline shift.  MRI still pending due to unsafe with cardiac pause  MRA  still pending due to unsafe with cardiac pause  2D Echo EF 60-65%. No source of embolus   LDL 66  HgbA1c 6.0  UDS +benzos  SCDs for VTE prophylaxis  aspirin 81 mg daily prior to admission, now on No antithrombotic d/t focal SAH.    Therapy recommendations:  pending   Disposition:  pending   Paroxysmal Atrial Fibrillation/Atrial Flutter w/ post conversion pauses  afib RVR with pauses overnight  TSH 6.781->7.052, free T4 normal 0.80   CHA2DS2-VASc Score = at least 5, ?2 oral anticoagulation recommended  [age] in Years:  631-74  +1    Sex:  Female   Female   +1    Hypertension History:  yes   +1     Diabetes Mellitus:  0  Congestive Heart Failure History:  0  Vascular Disease History:  0     Stroke/TIA/Thromboembolism History:  yes   +2 . Cardiology on board, appreciate help . Cardiology is debating on amiodarone drip  . [facility] anticoagulation [facility]   Acute Respiratory Failure  Intubated for IR, left intubated post IR for airway protection  Off Sedation   Did not tolerate spontaneous breathing trials today  Not able to extubate this am  CCM onboard, will continue to try weaning this pm  Hypotension Hx of hypertension  Home meds:  HCTZ 25  Current BP goal to 110-160 given SAH . Off Neo drip now . Long-term BP goal normotensive  Hyperlipidemia  Home meds:  lipitor 40, resumed in hospital  LDL 66, goal < 70  Continue statin at discharge  Acute renal failure  Urinary retention,  > 1L with in and out   On Foley catheter now   AKI likely postobstructive.   Cre 0.88->1.43  Close BMP monitoring  CCM on board  Prediabetes  HgbA1c 6.0, goal < 7.0  CBGs, SSI  PCP follow up  Other Stroke Risk Factors  Advanced age  Overweight, Body mass index is 27.74 kg/m., recommend  weight loss, diet and exercise as appropriate   Other Active Problems  Hypothyroid on synthroid - TSH 6.781-> 7.052, free T4 normal 0.80  Vomited coffee ground emesis. PPI bid added. Hgb 13.3->12.2   Hospital day # 1  This patient is critically ill due to stroke with MCA occlusion s/p IR, SAH, afib with RVR, cardiac pauses, hypotension and at significant risk of neurological worsening, death form recurrent stroke, vasospasm, seizure, heart failure, shock. This patient's care requires constant monitoring of vital signs, hemodynamics, respiratory and cardiac monitoring, review of multiple databases, neurological assessment, discussion with family, other specialists and medical decision making of high complexity. I spent 40 minutes of neurocritical care time in the care of this patient. I had long discussion with son at bedside, updated pt current condition, treatment plan and potential prognosis, and answered all the questions. Son expressed understanding and appreciation. I also discussed with CCM NP [place].  JRosalin Hawking MD PhD Stroke Neurology 07/21/2019 11:32 AM   To contact Stroke Continuity provider, please refer to [email or web address] After hours, contact General Neurology

## 2019-07-21 NOTE — Progress Notes (Signed)
NAME:  Jamie Mitchell, MRN:  [id number], DOB:  05-06-1946, LOS: 1 ADMISSION DATE:  07/19/2019, CONSULTATION DATE:  07/19/2018 REFERRING MD:  Neurology, CHIEF COMPLAINT:  Ventilator and medical management   Brief History   74 yo F who presented with right sided weakness found to have acute left M2/ MCA occlusion s/p tPA and emergent thrombectomy which was complicated by moderate left sylvian SAH.  On 2/25 patient with Afib/ Aflutter with RVR and post conversion pauses.  EP consulted and monitoring.  SAH stable on CTH thus far.   History of present illness   HPI unable to obtain from patient due to intubated status.  From chart review, patient was last known normal at 2150, found 2215 by family with acute right sided weakness, left gaze, dysarthria.  History of Afib in the remote past, no history of strokes.  She presented to the ED as a CODE STROKE and found to have no hemorrhage on CT head, and tPA was then initiated.  During tPA infusion, CTA head was done showing M2 occlusion.  She was taken for thrombectomy and revascularization, which was complicated by subarachnoid hemorrhage (moderate left sylvian SAH).  She was kept intubated and sedated on propofol and sent to neuro ICU.  On arrival to the ICU, patient was in afib and intermittent bradycardic with HR in the 50s.  She was mildly hypotensive with MAPs ~70.  She was on high dose propofol.      Past Medical History  Hypothyroidism HTN Paroxysmal Afib  Significant Hospital Events   tPA 2/24, 2317 L M2/MCA thrombectomy 2/24  Consults:  PCCM Cards/ EP   Procedures:  2/25  L M2/MCA thrombectomy 2/24  2/25 ETT >> 2/26 R radial Aline >>  Significant Diagnostic Tests:  2/24 [facility] code stroke >> 1. Subtle loss of gray-white matter differentiation involving the left frontal operculum, concerning for acute left MCA territory infarct. Hyperdensity at the level of a distal left M1/proximal M2 branch concerning for LVO. Further  assessment with dedicated CTA recommended. No intracranial hemorrhage. 2. ASPECTS is 9. 3. Small chronic high left frontal infarct.  2/24 CTA head and neck: 1. Positive CTA for emergent large vessel occlusion, with occlusive thrombus within a proximal left M2 branch, superior division.  Attenuated flow distally either due to subocclusive thrombus and/or  collateralization. 2. Diffuse tortuosity about the major arterial vasculature of the head and neck, suggesting chronic underlying hypertension. No other hemodynamically significant or correctable stenosis. 3. Fetal type origin of the PCAs with overall diminutive vertebrobasilar system.  2/25 CTH >> Continued further demarcation of an acute ischemic infarct centered within the anterolateral left frontal lobe with involvement of the left frontal operculum as well as left insula and subinsular region. There may be mild petechial hemorrhage in the infarction territory. No significant mass effect at this time.  Small to moderate volume acute subarachnoid hemorrhage within the left sylvian fissure and extending to the basal cisterns, not significantly changed from postprocedural head CT 07/19/2019. No evidence of hydrocephalus.  2/25 TTE >> 1. Left ventricular ejection fraction, by estimation, is 60 to 65%. The left ventricle has normal function. The left ventricle has no regional wall motion abnormalities. Left ventricular diastolic parameters were normal.  2. Right ventricular systolic function is normal. The right ventricular size is normal. Tricuspid regurgitation signal is inadequate for assessing PA pressure.  3. The mitral valve is normal in structure and function. Mild mitral valve regurgitation. No evidence of mitral stenosis.  4. The aortic  valve is normal in structure and function. Aortic valve regurgitation is not visualized. No aortic stenosis is present.  5. The inferior vena cava is normal in size with greater than 50% respiratory  variability, suggesting right atrial pressure of 3 mmHg.   2/25 CTH >> Subarachnoid hemorrhage at the base of the brain and sylvian fissure on the left is becoming less dense. No evidence of additional bleeding.  Well-circumscribed infarction in the left frontal operculum, insula and subinsular brain as seen previously. No evidence of infarct extension. Mild regional swelling with left-to-right shift of 1-2 mm. No infarct hematoma.  Micro Data:  2/25 Sars Cov2 negative 2/25 Flu A/ B negative 2/25 MRSA PCR >> neg  Antimicrobials:  2/25 cefazolin  Interim history/subjective:  Overnight CTH stable Continued intermittent conversion pauses overnight, up to 4- 8.1 seconds  Objective   Blood pressure 96/64, pulse (!) 112, temperature 98.9 F (37.2 C), temperature source Oral, resp. rate 15, height 5\' 2"  (1.575 m), weight 68.8 kg, SpO2 95 %.    Vent Mode: PRVC FiO2 (%):  [40 %-60 %] 40 % Set Rate:  [15 bmp] 15 bmp Vt Set:  [400 mL] 400 mL PEEP:  [5 cmH20] 5 cmH20 Plateau Pressure:  [10 [id number] cmH20] 12 cmH20   Intake/Output Summary (Last 24 hours) at 07/21/2019 0720 Last data filed at 07/21/2019 0600 Gross per 24 hour  Intake 3247.17 ml  Output 1502 ml  Net 1745.17 ml   Filed Weights   07/19/19 2200 07/19/19 2346  Weight: 68.8 kg 68.8 kg    Examination:  Fentanyl gtt at 225 mcg/min since reduced  General:  Older Hispanic female lying in bed in NAD HEENT: MM pink/moist, ETT 8.0 at 22 at teeth, no OGT, pupils 3/reactive Neuro: Wakes up easily to touch, does not f/c but MAE, weaker in RUE CV: irir, +2 pulses, right groin site c/d/i PULM:  Non labored on full MV support, CTA, bradypnea on SBT  GI: obese, soft, NT/ ND, +bs, purwick Extremities: warm/dry, no LE edema  Skin: no rashes  2/26 CXR- stable placement of ETT  Resolved Hospital Problem list     Assessment & Plan:    L MCA stroke, M2:  Thrombectomy and s/p TPA.  Complicated by [facility] P:  Follow up [place] overnight  stable Holding anti platelets and anticoagulation Serial neuro exams  Further stroke workup per Neuro    PAF/ Aflutter with post conversion pauses P:  TTE 2/25 with normal EF and mild MR, normal atrial sizes Close tele monitoring EP following, appreciate input, ok to proceed with extubation today, no plans for intervention at this time, just monitoring Will need [place] when ok with Neurology  close electrolyte monitoring, goal K > 4, Mag > 2 TSH 7.052, T4 free 0.80 Lipitor daily    Respiratory insufficiency in the post procedure setting  P:  Full MV support, PRVC 6-8cc/kg IBW, rate 15 Hopeful to wean sedation and extubate this morning PAD protocol- wean fentanyl gtt to prn  VAP measures Prn duonebs Will need SLP post extubation  Hypotension Hx HTN - likely related to sedation, no clear evidence of infectious etiology  P:  Neo for MAP goal > 65 Continue aline for now   AKI  - possibly related from hypotension vs urinary retention Urinary retention Mild hyperchloremic acidosis  P:  > 700 ml on bladder scan this am, post I/O, > 1L output therefore will place foley  Stop MIVF,  net+3.3 liters  Trend BMP / urinary output/  strict I/Os Replace electrolytes as indicated Avoid nephrotoxic agents, ensure adequate renal perfusion  Hypothyroidism  P:  No PO access, will change to IV till able to safely take PO's   Best practice:  Diet: NPO Pain/Anxiety/Delirium protocol (if indicated): RASS goal 0/-1, wean fentanyl gtt to PRN VAP protocol (if indicated): yes DVT prophylaxis: SCDs for now  GI prophylaxis: ppi Glucose control: SSI q4H Mobility: push PT/OT when able  Code Status: Full Family Communication: pending.  Disposition: neuro ICU  Labs   CBC: Recent Labs  Lab 07/19/19 2300 07/19/19 2300 07/19/19 2305 07/20/19 0356 07/20/19 0426 07/20/19 0901 07/21/19 0405  WBC 6.9  --   --  9.2  --   --  9.4  NEUTROABS 2.7  --   --   --   --   --   --   HGB 14.8   < >  15.0 13.7 14.3 13.3 12.2  HCT 45.4   < > 44.0 42.3 42.0 39.0 39.0  MCV 92.5  --   --  91.6  --   --  94.7  PLT 256  --   --  264  --   --  214   < > = values in this interval not displayed.    Basic Metabolic Panel: Recent Labs  Lab 07/19/19 2300 07/19/19 2300 07/19/19 2305 07/19/19 2305 07/20/19 0356 07/20/19 0426 07/20/19 0901 07/20/19 1921 07/21/19 0405  NA 140   < > 140   < > 137 142 143 142 145  K 4.3   < > 4.2   < > 4.0 4.0 4.0 4.2 4.3  CL 107  --  105  --  110  --   --  115* 118*  CO2 24  --   --   --  20*  --   --  17* 19*  GLUCOSE 105*  --  101*  --  192*  --   --  129* 125*  BUN 18  --  19  --  14  --   --  11 12  CREATININE 0.92  --  0.90  --  0.82  --   --  0.88 1.43*  CALCIUM 9.2  --   --   --  7.7*  --   --  8.3* 8.1*  MG  --   --   --   --  1.9  --   --  3.0*  --   PHOS  --   --   --   --  3.9  --   --   --   --    < > = values in this interval not displayed.   GFR: Estimated Creatinine Clearance: 32.8 mL/min (A) (by C-G formula based on SCr of 1.43 mg/dL (H)). Recent Labs  Lab 07/19/19 2300 07/20/19 0356 07/21/19 0405  WBC 6.9 9.2 9.4    Liver Function Tests: Recent Labs  Lab 07/19/19 2300  AST 22  ALT 15  ALKPHOS 106  BILITOT 0.9  PROT 7.8  ALBUMIN 4.0   No results for input(s): LIPASE, AMYLASE in the last 168 hours. No results for input(s): AMMONIA in the last 168 hours.  ABG    Component Value Date/Time   PHART 7.358 07/20/2019 0901   PCO2ART 38.2 07/20/2019 0901   PO2ART 133.0 (H) 07/20/2019 0901   HCO3 21.5 07/20/2019 0901   TCO2 23 07/20/2019 0901   ACIDBASEDEF 4.0 (H) 07/20/2019 0901   O2SAT 99.0 07/20/2019 0901  Coagulation Profile: Recent Labs  Lab 07/19/19 2300  INR 1.0    Cardiac Enzymes: No results for input(s): CKTOTAL, CKMB, CKMBINDEX, TROPONINI in the last 168 hours.  HbA1C: Hgb A1c MFr Bld  Date/Time Value Ref Range Status  07/20/2019 03:56 AM 6.1 (H) 4.8 - 5.6 % Final    Comment:    (NOTE) Pre  diabetes:          5.7%-6.4% Diabetes:              >6.4% Glycemic control for   <7.0% adults with diabetes   07/20/2019 03:56 AM 6.0 (H) 4.8 - 5.6 % Final    Comment:    (NOTE) Pre diabetes:          5.7%-6.4% Diabetes:              >6.4% Glycemic control for   <7.0% adults with diabetes     CBG: Recent Labs  Lab 07/20/19 1327 07/20/19 1605 07/20/19 1955 07/20/19 2340 07/21/19 0429  GLUCAP 131* 117* 118* 111* 123*    CCT 40 mins      Kennieth Rad, MSN, AGACNP-BC [facility] 07/21/2019, 8:16 AM

## 2019-07-21 NOTE — Progress Notes (Signed)
RN notified cardiology MD about patient continued pauses longer than 7.2 seconds and frequency. Per MD, RN to continue to monitor and notify again if pauses are longer than 7 seconds with increased frequency. Pacer pads to remain on patient with atropine at bedside. RN will continue to monitor.

## 2019-07-21 NOTE — Progress Notes (Signed)
Arterial line placed, RN aware of placement. Line draws back and flushes w/o complications

## 2019-07-21 NOTE — Progress Notes (Addendum)
Electrophysiology Rounding Note  Patient Name: Jamie Mitchell Date of Encounter: 07/21/2019  Primary Cardiologist: Donato Heinz, MD Electrophysiologist: New (Dr. Lovena Le consulted)   Subjective   Remains intubated and sedated.  Continue to have intermittent episodes of AF/AFL overnight with post conversion pauses ranging from 4 seconds to her longest pause thus far of 8.1 seconds. This occurred at 0052 am and she has NOT had any other pauses since.   Inpatient Medications    Scheduled Meds: . atorvastatin  40 mg Oral Daily  . atropine  1 mg Intravenous Once  . chlorhexidine gluconate (MEDLINE KIT)  15 mL Mouth Rinse BID  . Chlorhexidine Gluconate Cloth  6 each Topical Daily  . insulin aspart  0-9 Units Subcutaneous Q4H  . levothyroxine  75 mcg Per Tube Daily  . mouth rinse  15 mL Mouth Rinse 10 times per day  . pantoprazole (PROTONIX) IV  40 mg Intravenous Q12H   Continuous Infusions: . sodium chloride    . sodium chloride    . sodium chloride 75 mL/hr at 07/21/19 0202  . fentaNYL infusion INTRAVENOUS 225 mcg/hr (07/21/19 0600)  . phenylephrine (NEO-SYNEPHRINE) Adult infusion 20 mcg/min (07/21/19 0600)   PRN Meds: acetaminophen **OR** acetaminophen (TYLENOL) oral liquid 160 mg/5 mL **OR** acetaminophen, fentaNYL, midazolam, midazolam, ondansetron (ZOFRAN) IV, senna-docusate   Vital Signs    Vitals:   07/21/19 0530 07/21/19 0600 07/21/19 0630 07/21/19 0725  BP: 100/62 (!) _0  Pulse: (!) 48 (!) 48 (!) 112 68  Resp: _1 Temp:      TempSrc:      SpO2: 96% 95% 95% 98%  Weight:      Height:        Intake/Output Summary (Last 24 hours) at 07/21/2019 0728 Last data filed at 07/21/2019 0600 Gross per 24 hour  Intake 3247.17 ml  Output 1502 ml  Net 1745.17 ml   Filed Weights   07/19/19 2200 07/19/19 2346  Weight: 68.8 kg 68.8 kg    Physical Exam    GEN- The patient is intubated and sedated.  Head- normocephalic,  atraumatic Eyes-  Sclera clear, conjunctiva pink Oropharynx- clear Neck- supple Lungs- +Mechanical breathing sounds.  Heart- Regular rate and rhythm at this time, no murmurs, rubs or gallops GI- soft, NT, ND, + BS Extremities- no clubbing, cyanosis, or edema Skin- no rash or lesion Neuro- Intubated and sedated.    Labs    CBC Recent Labs    07/19/19 2300 07/19/19 2305 07/20/19 0356 07/20/19 0426 07/20/19 0901 07/21/19 0405  WBC 6.9   < > 9.2  --   --  9.4  NEUTROABS 2.7  --   --   --   --   --   HGB 14.8   < > 13.7   < > 13.3 12.2  HCT 45.4   < > 42.3   < > 39.0 39.0  MCV 92.5   < > 91.6  --   --  94.7  PLT 256   < > 264  --   --  214   < > = values in this interval not displayed.   Basic Metabolic Panel Recent Labs    07/20/19 0356 07/20/19 0426 07/20/19 1921 07/21/19 0405  NA 137   < > 142 145  K 4.0   < > 4.2 4.3  CL 110   < > 115* 118*  CO2 20*   < > 17* 19*  GLUCOSE 192*   < >  129* 125*  BUN 14   < > 11 12  CREATININE 0.82   < > 0.88 1.43*  CALCIUM 7.7*   < > 8.3* 8.1*  MG 1.9  --  3.0*  --   PHOS 3.9  --   --   --    < > = values in this interval not displayed.   Liver Function Tests Recent Labs    07/19/19 2300  AST 22  ALT 15  ALKPHOS 106  BILITOT 0.9  PROT 7.8  ALBUMIN 4.0   No results for input(s): LIPASE, AMYLASE in the last 72 hours. Cardiac Enzymes No results for input(s): CKTOTAL, CKMB, CKMBINDEX, TROPONINI in the last 72 hours.   Telemetry    NSR/Sinus brady 50-60s currently, episodes of AF/AFL with variable rates and post conversion pauses as below. (personally reviewed)  Radiology    CT Code Stroke CTA Head W/WO contrast  Result Date: 07/19/2019 CLINICAL DATA:  Initial evaluation for acute right-sided weakness. EXAM: CT ANGIOGRAPHY HEAD AND NECK TECHNIQUE: Multidetector CT imaging of the head and neck was performed using the standard protocol during bolus administration of intravenous contrast. Multiplanar CT image  reconstructions and MIPs were obtained to evaluate the vascular anatomy. Carotid stenosis measurements (when applicable) are obtained utilizing NASCET criteria, using the distal internal carotid diameter as the denominator. CONTRAST:  51m OMNIPAQUE IOHEXOL 350 MG/ML SOLN COMPARISON:  Prior head CT from earlier same day. FINDINGS: CTA NECK FINDINGS Aortic arch: Visualized aortic arch normal in caliber with normal branch pattern. Bovine branching pattern with common origin of the right brachiocephalic and left common carotid artery noted. No hemodynamically significant stenosis seen about the origin of the great vessels. Subclavian arteries widely patent. Right carotid system: Right common and internal carotid arteries widely patent to the skull base without stenosis, dissection or occlusion. Proximal right ICA medialized into the retropharyngeal space. Focal tortuosity the distal cervical right ICA noted. Left carotid system: Left common and internal carotid arteries widely patent to the skull base without stenosis, dissection or occlusion. Proximal left ICA medialized into the retropharyngeal space. Evaluation the distal cervical left ICA limited by motion. Vertebral arteries: Both vertebral arteries arise from the subclavian arteries. Vertebral arteries mildly tortuous but widely patent within the neck without stenosis, dissection or occlusion. Skeleton: No acute osseous abnormality. No discrete lytic or blastic osseous lesions. Prominent degenerative changes noted at the C1-2 articulation, greater on the right. Other neck: No other acute soft tissue abnormality within the neck. No mass lesion or adenopathy. Upper chest: Mild scattered atelectatic changes noted within the visualized lungs. Visualized upper chest demonstrates no acute finding. Review of the MIP images confirms the above findings CTA HEAD FINDINGS Anterior circulation: Examination mildly degraded by motion artifact. Petrous, cavernous, and  supraclinoid ICAs widely patent without flow-limiting stenosis. ICA termini well perfused. A1 segments patent bilaterally. Normal anterior communicating artery complex. Anterior cerebral arteries widely patent to their distal aspects without stenosis. Right M1 widely patent. Normal right MCA bifurcation. Distal right MCA branches well perfused. Left M1 widely patent. Normal left MCA bifurcation. Occlusive thrombus seen within a proximal left M2 branch, superior division (series 7, image 94), acute in nature. Attenuated opacification of left MCA branches distally, either due to subocclusive thrombus and/or moderate collateralization. Remainder of the left MCA branches remain patent. Posterior circulation: Vertebral arteries grossly patent to the vertebrobasilar junction without appreciable stenosis, although evaluation limited by motion. Neither PICA well visualized. Basilar diminutive but patent to its distal aspect without appreciable  stenosis. Superior cerebral arteries patent bilaterally. Predominant fetal type origin of both PCAs, supplied via widely patent and robust posterior communicating arteries. Both PCAs well perfused to their distal aspects. Venous sinuses: Grossly patent allowing for timing the contrast bolus. Anatomic variants: Fetal type origin of the PCAs with overall diminutive vertebrobasilar system. No appreciable aneurysm. Review of the MIP images confirms the above findings IMPRESSION: 1. Positive CTA for emergent large vessel occlusion, with occlusive thrombus within a proximal left M2 branch, superior division. Attenuated flow distally either due to subocclusive thrombus and/or collateralization. 2. Diffuse tortuosity about the major arterial vasculature of the head and neck, suggesting chronic underlying hypertension. No other hemodynamically significant or correctable stenosis. 3. Fetal type origin of the PCAs with overall diminutive vertebrobasilar system. Critical Value/emergent results  were called by telephone at the time of interpretation on 07/19/2019 at 11:26 pm to provider ERIC [facility] , who verbally acknowledged these results. Electronically Signed   By: Jeannine Boga M.D.   On: 07/19/2019 23:53   DG Chest 1 View  Result Date: 07/20/2019 CLINICAL DATA:  Intubation EXAM: CHEST  1 VIEW COMPARISON:  None. FINDINGS: Endotracheal tube tip is positioned within the proximal right mainstem bronchus. Heart size is mildly enlarged. No focal airspace consolidation. No pleural effusion or pneumothorax. IMPRESSION: 1. Endotracheal tube tip within the proximal right mainstem bronchus. Recommend retraction approximately 3.0 cm. 2. Mild cardiomegaly. These results will be called to the ordering clinician or representative by the Radiologist Assistant, and communication documented in the PACS or zVision Dashboard. Electronically Signed   By: Davina Poke D.O.   On: 07/20/2019 10:36   CT HEAD WO CONTRAST  Result Date: 07/20/2019 CLINICAL DATA:  Follow-up stroke EXAM: CT HEAD WITHOUT CONTRAST TECHNIQUE: Contiguous axial images were obtained from the base of the skull through the vertex without intravenous contrast. COMPARISON:  Head CT earlier same day. FINDINGS: Brain: Acute infarction of the left frontal operculum, anterior insular and subinsular brain as shown previously. Low-density and swelling but no evidence of further extension of the infarction. Small amount of subarachnoid hemorrhage at the base of the brain and within the sylvian fissure on the left is becoming slightly less dense. No evidence of additional or worsening hemorrhage. No significant mass effect. One or 2 mm of left-to-right midline shift at the most. No other area of acute or subacute infarction. 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 No hydrocephalus. No extra-axial collection. No intraparenchymal hemorrhage in the region of infarction. Vascular: No new  primary vascular finding. Skull: Negative Sinuses/Orbits: Clear/normal Other: None IMPRESSION: Subarachnoid hemorrhage at the base of the brain and sylvian fissure on the left is becoming less dense. No evidence of additional bleeding. Well-circumscribed infarction in the left frontal operculum, insula and subinsular brain as seen previously. No evidence of infarct extension. Mild regional swelling with left-to-right shift of 1-2 mm. No infarct hematoma. Electronically Signed   By: Nelson Chimes M.D.   On: 07/20/2019 21:53   CT HEAD WO CONTRAST  Result Date: 07/20/2019 CLINICAL DATA:  Stroke, follow-up. EXAM: CT HEAD WITHOUT CONTRAST TECHNIQUE: Contiguous axial images were obtained from the base of the skull through the vertex without intravenous contrast. COMPARISON:  Postprocedure head CT 07/20/2019, noncontrast head CT and CT angiogram head/neck 07/19/2019 FINDINGS: Brain: Mildly motion degraded examination. There is residual circulating contrast. There has been continued further demarcation of an acute ischemic infarct centered within the anterolateral left frontal lobe with involvement of the left frontal operculum  as well as left insula and subinsular region. There may be subtle petechial hemorrhage in the infarction territory. There is no significant mass effect at this time. No midline shift. Small to moderate volume acute subarachnoid hemorrhage within the left sylvian fissure and extending to the basal cisterns does not appear significantly changed as compared postprocedural head CT 07/19/2019. No evidence of hydrocephalus. Vascular: Circulating contrast material limits evaluation for abnormal vascular hyperdensity. Skull: Normal. Negative for fracture or focal lesion. Sinuses/Orbits: Visualized orbits demonstrate no acute abnormality. Mild right maxillary sinus mucosal thickening. Redemonstrated left middle ear/mastoid effusion. IMPRESSION: Continued further demarcation of an acute ischemic infarct  centered within the anterolateral left frontal lobe with involvement of the left frontal operculum as well as left insula and subinsular region. There may be mild petechial hemorrhage in the infarction territory. No significant mass effect at this time. Small to moderate volume acute subarachnoid hemorrhage within the left sylvian fissure and extending to the basal cisterns, not significantly changed from postprocedural head CT 07/19/2019. No evidence of hydrocephalus. Electronically Signed   By: Kellie Simmering DO   On: 07/20/2019 09:10   CT Code Stroke CTA Neck W/WO contrast  Result Date: 07/19/2019 CLINICAL DATA:  Initial evaluation for acute right-sided weakness. EXAM: CT ANGIOGRAPHY HEAD AND NECK TECHNIQUE: Multidetector CT imaging of the head and neck was performed using the standard protocol during bolus administration of intravenous contrast. Multiplanar CT image reconstructions and MIPs were obtained to evaluate the vascular anatomy. Carotid stenosis measurements (when applicable) are obtained utilizing NASCET criteria, using the distal internal carotid diameter as the denominator. CONTRAST:  68m OMNIPAQUE IOHEXOL 350 MG/ML SOLN COMPARISON:  Prior head CT from earlier same day. FINDINGS: CTA NECK FINDINGS Aortic arch: Visualized aortic arch normal in caliber with normal branch pattern. Bovine branching pattern with common origin of the right brachiocephalic and left common carotid artery noted. No hemodynamically significant stenosis seen about the origin of the great vessels. Subclavian arteries widely patent. Right carotid system: Right common and internal carotid arteries widely patent to the skull base without stenosis, dissection or occlusion. Proximal right ICA medialized into the retropharyngeal space. Focal tortuosity the distal cervical right ICA noted. Left carotid system: Left common and internal carotid arteries widely patent to the skull base without stenosis, dissection or occlusion.  Proximal left ICA medialized into the retropharyngeal space. Evaluation the distal cervical left ICA limited by motion. Vertebral arteries: Both vertebral arteries arise from the subclavian arteries. Vertebral arteries mildly tortuous but widely patent within the neck without stenosis, dissection or occlusion. Skeleton: No acute osseous abnormality. No discrete lytic or blastic osseous lesions. Prominent degenerative changes noted at the C1-2 articulation, greater on the right. Other neck: No other acute soft tissue abnormality within the neck. No mass lesion or adenopathy. Upper chest: Mild scattered atelectatic changes noted within the visualized lungs. Visualized upper chest demonstrates no acute finding. Review of the MIP images confirms the above findings CTA HEAD FINDINGS Anterior circulation: Examination mildly degraded by motion artifact. Petrous, cavernous, and supraclinoid ICAs widely patent without flow-limiting stenosis. ICA termini well perfused. A1 segments patent bilaterally. Normal anterior communicating artery complex. Anterior cerebral arteries widely patent to their distal aspects without stenosis. Right M1 widely patent. Normal right MCA bifurcation. Distal right MCA branches well perfused. Left M1 widely patent. Normal left MCA bifurcation. Occlusive thrombus seen within a proximal left M2 branch, superior division (series 7, image 94), acute in nature. Attenuated opacification of left MCA branches distally, either due to subocclusive  thrombus and/or moderate collateralization. Remainder of the left MCA branches remain patent. Posterior circulation: Vertebral arteries grossly patent to the vertebrobasilar junction without appreciable stenosis, although evaluation limited by motion. Neither PICA well visualized. Basilar diminutive but patent to its distal aspect without appreciable stenosis. Superior cerebral arteries patent bilaterally. Predominant fetal type origin of both PCAs, supplied via  widely patent and robust posterior communicating arteries. Both PCAs well perfused to their distal aspects. Venous sinuses: Grossly patent allowing for timing the contrast bolus. Anatomic variants: Fetal type origin of the PCAs with overall diminutive vertebrobasilar system. No appreciable aneurysm. Review of the MIP images confirms the above findings IMPRESSION: 1. Positive CTA for emergent large vessel occlusion, with occlusive thrombus within a proximal left M2 branch, superior division. Attenuated flow distally either due to subocclusive thrombus and/or collateralization. 2. Diffuse tortuosity about the major arterial vasculature of the head and neck, suggesting chronic underlying hypertension. No other hemodynamically significant or correctable stenosis. 3. Fetal type origin of the PCAs with overall diminutive vertebrobasilar system. Critical Value/emergent results were called by telephone at the time of interpretation on 07/19/2019 at 11:26 pm to provider ERIC [facility] , who verbally acknowledged these results. Electronically Signed   By: Jeannine Boga M.D.   On: 07/19/2019 23:53   IR CT Head Ltd  Result Date: 07/20/2019 INDICATION: 74 year old female with past medical history significant for hypothyroidism presenting as a code stroke with right-sided hemiplegia and new onset atrial fibrillation. Her last known well was 21:50 on 07/19/2019. Head CT showed loss of gray-white differentiation in the left frontal for colon extending to the anterior aspect of the insula with no hemorrhage. CT angiogram showed a left M2/MCA/superior division branch occlusion. NIHSS 25. EXAM: Diagnostic cerebral angiogram Mechanical thrombectomy Flat panel head CT COMPARISON:  CT/CT angiogram of the head and neck July 19, 2019 MEDICATIONS: 5 mg of problem you have intra arterial to the left ICA. ANESTHESIA/SEDATION: General anesthesia CONTRAST:  75 mL FLUOROSCOPY TIME:  Fluoroscopy Time: 55 minutes 18 seconds (1,796  mGy). COMPLICATIONS: Moderate left sylvian subarachnoid hemorrhage TECHNIQUE: Informed written consent was obtained from the patient's son after a thorough discussion of the procedural risks, benefits and alternatives. All questions were addressed. Maximal Sterile Barrier Technique was utilized including caps, mask, sterile gowns, sterile gloves, sterile drape, hand hygiene and skin antiseptic. A timeout was performed prior to the initiation of the procedure. Using a micropuncture kit and modified Seldinger technique, access was gained to the right common femoral artery and an 8 French sheath was placed in the right common femoral artery. Under fluoroscopy, an 8 [place] Walrus balloon guide catheter was navigated over a 6 range Berenstein 2 catheter and a 0.035 inch Terumo Glidewire into the aortic arch. Under fluoroscopy, the catheter was advanced into the cervical right internal carotid artery. The wire and 6 French catheter were removed and angiograms of the head were obtained in frontal and lateral views. FINDINGS: There is a left M2/MCA superior division branch occlusion. Prominent tortuosity of the cervical left ICA and left carotid siphon. PROCEDURE: A large bore catheter aspiration catheter was navigated through the balloon guide catheter and over a phenom 21 microcatheter and a synchro2 support microguidewire into the cavernous segment of the left ICA. The microcatheter was then advanced into the left M2/MCA superior division branch, distal to the point of occlusion. Frontal and lateral angiograms were obtained with microcatheter contrast injection. A 4 x 40 mm solitaire stent retriever was subsequently deployed spanning the distal left M1-mid M2. The  device was allowed to intercalated with the clot for 4 minutes. The microcatheter was removed. The guiding catheter balloon was inflated and constant aspiration was performed, the aspiration catheter was navigated near the occlusion and connected to a penumbra  aspiration pump. The stent retriever was then retracted along with the aspiration catheter. Frontal and lateral angiograms were obtained showing persistent left M2/MCA occlusion. In a similar fashion and using the same platform, a second stent retriever pass was performed with the solitaire device. Frontal and lateral angiograms were obtained showing persistent left M2/MCA occlusion with the clot migrating proximally. A catalyst 6 aspiration catheter was navigated through the balloon guide catheter and over a phenom 21 microcatheter and posterior 0.014 inch Aristotle microguidewire into the cavernous segment of the left ICA. The microcatheter was then advanced into the left M2/MCA superior division branch, distal to the point of occlusion. Frontal and lateral angiograms were obtained with microcatheter contrast injection. A 4 x 41 mm trevo stent retriever was subsequently deployed spanning the distal left M1-mid M2. The device was allowed to intercalated with the clot for 4 minutes. The microcatheter was removed. The guiding catheter balloon was inflated and constant aspiration was performed, the aspiration catheter was navigated near the occlusion and connected to a penumbra aspiration pump. The stent retriever was then retracted along with the aspiration catheter. Frontal and lateral angiograms were obtained showing distal contrast penetration into the left M2/MCA superior division branch with severe vasospasm. Intra arterial infusion of 5 mg of verapamil was performed over 10 minutes. Follow-up angiogram showed reocclusion of the left M2/MCA superior division branch. A 3 max aspiration catheter was navigated through the balloon guide catheter and over the Aristotle microguidewire into the left and 2/MCA superior division branch at the level of occlusion. Continuous is aspiration was performed for 4 minutes. The guiding catheter balloon was inflated and the aspiration catheter was then removed. Follow-up left ICA  angiograms with frontal and lateral views showed recanalization of the left M2/MCA superior division branch with slow flow in the distal angular branch (TICI 2C). A flat panel CT of the head was then obtained. Moderate left sylvian subarachnoid hemorrhage was identified. The catheter was then removed. A right common femoral artery angiogram was obtained via sheath side port. A 6 French Perclose ProGlide was utilized for right, femoral access closure. Immediate hemostasis was achieved. Patient was transferred to ICU for continued monitoring. Family and neurology team communicated immediately after the end of the procedure. IMPRESSION: 1. Left M2/MCA superior division branch occlusion. 2. Successful mechanical thrombectomy/aspiration performed with TICI 2C final recanalization. A total of 3 stent retriever passes and 1 aspiration pass performed. 3. Moderate size left sylvian subarachnoid hemorrhage seen on postprocedure flat panel CT. PLAN: Continued monitoring in ICU level of care. Repeat head CT within 4 hours to evaluate for subarachnoid hemorrhage stability. Electronically Signed   By: Pedro Earls M.D.   On: 07/20/2019 09:27   ECHOCARDIOGRAM COMPLETE  Result Date: 07/20/2019    ECHOCARDIOGRAM REPORT   Patient Name:   Jamie Mitchell Date of Exam: 07/20/2019 Medical Rec #:  [id number]               Height:       62.0 in Accession #:    [id number]              Weight:       151.7 lb Date of Birth:  [birth year]  BSA:          1.700 m Patient Age:    41 years                BP:           124/68 mmHg Patient Gender: F                       HR:           79 bpm. Exam Location:  Inpatient Procedure: 2D Echo, Cardiac Doppler and Color Doppler STAT ECHO Indications:    CVA  History:        Patient has no prior history of Echocardiogram examinations. PAD                 and Stroke, Arrythmias:Atrial Fibrillation and Tachy-Brady; Risk                 Factors:Hypertension and  Dyslipidemia.  Sonographer:    Dustin Flock Referring Phys: [place]  1. Left ventricular ejection fraction, by estimation, is 60 to 65%. The left ventricle has normal function. The left ventricle has no regional wall motion abnormalities. Left ventricular diastolic parameters were normal.  2. Right ventricular systolic function is normal. The right ventricular size is normal. Tricuspid regurgitation signal is inadequate for assessing PA pressure.  3. The mitral valve is normal in structure and function. Mild mitral valve regurgitation. No evidence of mitral stenosis.  4. The aortic valve is normal in structure and function. Aortic valve regurgitation is not visualized. No aortic stenosis is present.  5. The inferior vena cava is normal in size with greater than 50% respiratory variability, suggesting right atrial pressure of 3 mmHg. FINDINGS  Left Ventricle: Left ventricular ejection fraction, by estimation, is 60 to 65%. The left ventricle has normal function. The left ventricle has no regional wall motion abnormalities. The left ventricular internal cavity size was normal in size. There is  no left ventricular hypertrophy. Left ventricular diastolic parameters were normal. Normal left ventricular filling pressure. Right Ventricle: The right ventricular size is normal. No increase in right ventricular wall thickness. Right ventricular systolic function is normal. Tricuspid regurgitation signal is inadequate for assessing PA pressure. Left Atrium: Left atrial size was normal in size. Right Atrium: Right atrial size was normal in size. Pericardium: There is no evidence of pericardial effusion. Mitral Valve: The mitral valve is normal in structure and function. Normal mobility of the mitral valve leaflets. Mild mitral valve regurgitation. No evidence of mitral valve stenosis. Tricuspid Valve: The tricuspid valve is normal in structure. Tricuspid valve regurgitation is trivial. No evidence of  tricuspid stenosis. Aortic Valve: The aortic valve is normal in structure and function. Aortic valve regurgitation is not visualized. No aortic stenosis is present. Pulmonic Valve: The pulmonic valve was normal in structure. Pulmonic valve regurgitation is not visualized. No evidence of pulmonic stenosis. Aorta: The aortic root is normal in size and structure. Venous: IVC assessment for right atrial pressure unable to be performed due to mechanical ventilation. The inferior vena cava is normal in size with greater than 50% respiratory variability, suggesting right atrial pressure of 3 mmHg. IAS/Shunts: No atrial level shunt detected by color flow Doppler.  LEFT VENTRICLE PLAX 2D LVIDd:         4.50 cm  Diastology LVIDs:         2.80 cm  LV e' lateral:   11.30 cm/s LV PW:  1.10 cm  LV E/e' lateral: 9.5 LV IVS:        1.20 cm  LV e' medial:    9.68 cm/s LVOT diam:     2.00 cm  LV E/e' medial:  11.1 LV SV:         61 LV SV Index:   36 LVOT Area:     3.14 cm  RIGHT VENTRICLE RV Basal diam:  2.90 cm RV S prime:     13.80 cm/s TAPSE (M-mode): 3.3 cm LEFT ATRIUM             Index       RIGHT ATRIUM           Index LA diam:        4.50 cm 2.65 cm/m  RA Area:     11.90 cm LA Vol (A2C):   45.3 ml 26.65 ml/m RA Volume:   26.60 ml  15.65 ml/m LA Vol (A4C):   40.5 ml 23.83 ml/m LA Biplane Vol: 45.7 ml 26.89 ml/m  AORTIC VALVE LVOT Vmax:   84.90 cm/s LVOT Vmean:  52.600 cm/s LVOT VTI:    0.195 m  AORTA Ao Root diam: 2.60 cm MITRAL VALVE MV Area (PHT): 3.37 cm     SHUNTS MV Decel Time: 225 msec     Systemic VTI:  0.20 m MV E velocity: 107.00 cm/s  Systemic Diam: 2.00 cm MV A velocity: 66.80 cm/s MV E/A ratio:  1.60 Fransico Him MD Electronically signed by Fransico Him MD Signature Date/Time: 07/20/2019/12:37:18 PM    Final    IR PERCUTANEOUS ART THROMBECTOMY/INFUSION INTRACRANIAL INC DIAG ANGIO  Result Date: 07/20/2019 INDICATION: 74 year old female with past medical history significant for hypothyroidism  presenting as a code stroke with right-sided hemiplegia and new onset atrial fibrillation. Her last known well was 21:50 on 07/19/2019. Head CT showed loss of gray-white differentiation in the left frontal for colon extending to the anterior aspect of the insula with no hemorrhage. CT angiogram showed a left M2/MCA/superior division branch occlusion. NIHSS 25. EXAM: Diagnostic cerebral angiogram Mechanical thrombectomy Flat panel head CT COMPARISON:  CT/CT angiogram of the head and neck July 19, 2019 MEDICATIONS: 5 mg of problem you have intra arterial to the left ICA. ANESTHESIA/SEDATION: General anesthesia CONTRAST:  75 mL FLUOROSCOPY TIME:  Fluoroscopy Time: 55 minutes 18 seconds (1,796 mGy). COMPLICATIONS: Moderate left sylvian subarachnoid hemorrhage TECHNIQUE: Informed written consent was obtained from the patient's son after a thorough discussion of the procedural risks, benefits and alternatives. All questions were addressed. Maximal Sterile Barrier Technique was utilized including caps, mask, sterile gowns, sterile gloves, sterile drape, hand hygiene and skin antiseptic. A timeout was performed prior to the initiation of the procedure. Using a micropuncture kit and modified Seldinger technique, access was gained to the right common femoral artery and an 8 French sheath was placed in the right common femoral artery. Under fluoroscopy, an 8 [place] Walrus balloon guide catheter was navigated over a 6 range Berenstein 2 catheter and a 0.035 inch Terumo Glidewire into the aortic arch. Under fluoroscopy, the catheter was advanced into the cervical right internal carotid artery. The wire and 6 French catheter were removed and angiograms of the head were obtained in frontal and lateral views. FINDINGS: There is a left M2/MCA superior division branch occlusion. Prominent tortuosity of the cervical left ICA and left carotid siphon. PROCEDURE: A large bore catheter aspiration catheter was navigated through the  balloon guide catheter and over a phenom 21 microcatheter and  a synchro2 support microguidewire into the cavernous segment of the left ICA. The microcatheter was then advanced into the left M2/MCA superior division branch, distal to the point of occlusion. Frontal and lateral angiograms were obtained with microcatheter contrast injection. A 4 x 40 mm solitaire stent retriever was subsequently deployed spanning the distal left M1-mid M2. The device was allowed to intercalated with the clot for 4 minutes. The microcatheter was removed. The guiding catheter balloon was inflated and constant aspiration was performed, the aspiration catheter was navigated near the occlusion and connected to a penumbra aspiration pump. The stent retriever was then retracted along with the aspiration catheter. Frontal and lateral angiograms were obtained showing persistent left M2/MCA occlusion. In a similar fashion and using the same platform, a second stent retriever pass was performed with the solitaire device. Frontal and lateral angiograms were obtained showing persistent left M2/MCA occlusion with the clot migrating proximally. A catalyst 6 aspiration catheter was navigated through the balloon guide catheter and over a phenom 21 microcatheter and posterior 0.014 inch Aristotle microguidewire into the cavernous segment of the left ICA. The microcatheter was then advanced into the left M2/MCA superior division branch, distal to the point of occlusion. Frontal and lateral angiograms were obtained with microcatheter contrast injection. A 4 x 41 mm trevo stent retriever was subsequently deployed spanning the distal left M1-mid M2. The device was allowed to intercalated with the clot for 4 minutes. The microcatheter was removed. The guiding catheter balloon was inflated and constant aspiration was performed, the aspiration catheter was navigated near the occlusion and connected to a penumbra aspiration pump. The stent retriever was then  retracted along with the aspiration catheter. Frontal and lateral angiograms were obtained showing distal contrast penetration into the left M2/MCA superior division branch with severe vasospasm. Intra arterial infusion of 5 mg of verapamil was performed over 10 minutes. Follow-up angiogram showed reocclusion of the left M2/MCA superior division branch. A 3 max aspiration catheter was navigated through the balloon guide catheter and over the Aristotle microguidewire into the left and 2/MCA superior division branch at the level of occlusion. Continuous is aspiration was performed for 4 minutes. The guiding catheter balloon was inflated and the aspiration catheter was then removed. Follow-up left ICA angiograms with frontal and lateral views showed recanalization of the left M2/MCA superior division branch with slow flow in the distal angular branch (TICI 2C). A flat panel CT of the head was then obtained. Moderate left sylvian subarachnoid hemorrhage was identified. The catheter was then removed. A right common femoral artery angiogram was obtained via sheath side port. A 6 French Perclose ProGlide was utilized for right, femoral access closure. Immediate hemostasis was achieved. Patient was transferred to ICU for continued monitoring. Family and neurology team communicated immediately after the end of the procedure. IMPRESSION: 1. Left M2/MCA superior division branch occlusion. 2. Successful mechanical thrombectomy/aspiration performed with TICI 2C final recanalization. A total of 3 stent retriever passes and 1 aspiration pass performed. 3. Moderate size left sylvian subarachnoid hemorrhage seen on postprocedure flat panel CT. PLAN: Continued monitoring in ICU level of care. Repeat head CT within 4 hours to evaluate for subarachnoid hemorrhage stability. Electronically Signed   By: Pedro Earls M.D.   On: 07/20/2019 09:27   CT HEAD CODE STROKE WO CONTRAST  Result Date: 07/19/2019 CLINICAL DATA:   Code stroke. Initial evaluation for right-sided facial droop with right-sided weakness. EXAM: CT HEAD WITHOUT CONTRAST TECHNIQUE: Contiguous axial images were obtained from the  base of the skull through the vertex without intravenous contrast. COMPARISON:  None. FINDINGS: Brain: Mild age-related cerebral atrophy. Small focus of encephalomalacia involving the cortical/subcortical high left frontal lobe consistent with a small chronic left MCA territory infarct (series 3, image 25). Few scattered parenchymal calcifications noted at the right frontal and left parietal region. There is subtle loss of gray-white matter differentiation involving the region of the left frontal operculum, suspicious for early/developing acute left MCA territory infarct. Left insula relatively well maintained at this time as are the deep gray nuclei. No intracranial hemorrhage or mass effect. No other acute large vessel territory infarct. No mass lesion or midline shift. No hydrocephalus. No extra-axial fluid collection. Vascular: Asymmetric hyperdensity seen M2 branch (series 6, image 45), suspicious for LV of. Of distal left M1 and/or proximal Skull: Scalp soft tissues and calvarium within normal limits. Sinuses/Orbits: Left gaze noted. Globes and orbital soft tissues otherwise unremarkable. Mild mucosal thickening noted within the right sphenoid sinus. Chronic appearing left mastoid and middle ear effusion noted. Visualized nasopharynx within normal limits. Other: None. ASPECTS [facility] Stroke Program Early CT Score) - Ganglionic level infarction (caudate, lentiform nuclei, internal capsule, insula, M1-M3 cortex): 7 - Supraganglionic infarction (M4-M6 cortex): 2 Total score (0-10 with 10 being normal): 9 IMPRESSION: 1. Subtle loss of gray-white matter differentiation involving the left frontal operculum, concerning for acute left MCA territory infarct. Hyperdensity at the level of a distal left M1/proximal M2 branch concerning for LVO.  Further assessment with dedicated CTA recommended. No intracranial hemorrhage. 2. ASPECTS is 9. 3. Small chronic high left frontal infarct. Critical Value/emergent results were called by telephone at the time of interpretation on 07/19/2019 at 11:10 pm to provider [facility] , who verbally acknowledged these results. Electronically Signed   By: Jeannine Boga M.D.   On: 07/19/2019 23:35    Patient Profile     Glenisha Gundry is a 74 y.o. female with a history of HTN, HLD, ?PAD, and thyroid disease who is being seen today for the evaluation of Paroxysmal afib with post conversion pauses at the request of Dr. Cheral Marker.  Assessment & Plan    1. Paroxysmal Atrial fibrillation/Atrial flutter with post conversion pauses No known history She continued to have pauses and arrhythmias overnight Ideally, would start on low dose amiodarone, but hypotensive this am. Will continue to follow.  Timing of [place] per neuro with SAH after TPA.  Echo with normal EF and mild MR. Normal atrial sizes.   2. CVA and SAH s/p TPA [place] per neuro Remains intubated and sedated at this time.   3. HTN Systolic BP in 30Q this am on 30 of phenylephrine via ART line  For questions or updates, please contact [place] Please consult [email or web address] for contact info under Cardiology/STEMI.  Signed, Shirley Friar, PA-C  07/21/2019, 7:28 AM   EP Attending  Patient seen and examined. Agree with above. The patient remains intubated though she is awake. On my visit, she went back into atrial fib/flutter from NSR. She is hypotensive on pressors. At this point, options for treatment are limited. With her pressure low in NSR, we really cannot treat her with IV amiodarone. I think it reasonable to put amio down an NG tube if it is going to be a while before she can be extubated. If she can be extubated then oral amiodarone would be recommended.   Mikle Bosworth.D.

## 2019-07-21 NOTE — Procedures (Signed)
Arterial Catheter Insertion Procedure Note Tremia Vanhoose [id number] May 28, 1945  Procedure: Insertion of Arterial Catheter  Indications: Blood pressure monitoring  Procedure Details Consent: Unable to obtain consent because of altered level of consciousness. Time Out: Verified patient identification, verified procedure, site/side was marked, verified correct patient position, special equipment/implants available, medications/allergies/relevent history reviewed, required imaging and test results available.  Performed  Maximum sterile technique was used including antiseptics, cap, gloves, gown, hand hygiene, mask and sheet. Skin prep: Chlorhexidine; local anesthetic administered 20 gauge catheter was inserted into right radial artery using the Seldinger technique. ULTRASOUND GUIDANCE USED: NO Evaluation Blood flow good; BP tracing good. Complications: No apparent complications.   Marlowe Aschoff 07/21/2019

## 2019-07-21 NOTE — Progress Notes (Signed)
Initial Nutrition Assessment  DOCUMENTATION CODES:   Not applicable  INTERVENTION:   Initiate Osmolite 1.2 @ 40 ml/hr via Cortrak tube (960 ml/day) 30 ml Prostat BID  Provides: 1350 kcal, 83 grams protein, and 778 ml free water.    NUTRITION DIAGNOSIS:   Inadequate oral intake related to inability to eat as evidenced by NPO status.  GOAL:   Patient will meet greater than or equal to 90% of their needs  MONITOR:   TF tolerance, Diet advancement, Vent status  REASON FOR ASSESSMENT:   Ventilator, Consult Enteral/tube feeding initiation and management  ASSESSMENT:   Pt with PMH of  HTN admitted with acute L MCA s/p tPA and IR for thrombectomy complicated by [facility].    Pt discussed during ICU rounds and with RN.  Pt has remained intubated and per RN has pauses.  Pt awake and alert on vent support during visit.  Per RN plan for cortrak today for possible extubation. Suspect pt is globally aphasic and will need nutrition support after extubation.   Patient is currently intubated on ventilator support MV: 5.5 L/min Temp (24hrs), Avg:98.9 F (37.2 C), Min:98.7 F (37.1 C), Max:98.9 F (37.2 C)  Medications and labs reviewed    NUTRITION - FOCUSED PHYSICAL EXAM:    Most Recent Value  Orbital Region  No depletion  Upper Arm Region  No depletion  Thoracic and Lumbar Region  No depletion  Buccal Region  Unable to assess  Temple Region  No depletion  Clavicle Bone Region  No depletion  Clavicle and Acromion Bone Region  No depletion  Scapular Bone Region  Unable to assess  Dorsal Hand  Unable to assess  Patellar Region  No depletion  Anterior Thigh Region  No depletion  Posterior Calf Region  No depletion  Edema (RD Assessment)  Mild  Hair  Reviewed  Eyes  Reviewed  Mouth  Unable to assess  Skin  Reviewed  Nails  Reviewed       Diet Order:   Diet Order            Diet NPO time specified  Diet effective now              EDUCATION NEEDS:   No  education needs have been identified at this time  Skin:  Skin Assessment: Reviewed RN Assessment  Last BM:  unknown  Height:   Ht Readings from Last 1 Encounters:  07/19/19 5\' 2"  (1.575 m)    Weight:   Wt Readings from Last 1 Encounters:  07/19/19 68.8 kg    Ideal Body Weight:  50 kg  BMI:  Body mass index is 27.74 kg/m.  Estimated Nutritional Needs:   Kcal:  1350  Protein:  82-95 grams  Fluid:  > 1.5 L/day  Lockie Pares., RD, LDN, CNSC See AMiON for contact information

## 2019-07-21 NOTE — Progress Notes (Signed)
Referring Physician(s):  Dr. Kerney Elbe  Supervising Physician:  Renata Caprice  Patient Status:  [facility] - In-pt  Chief Complaint:  L M2/MCA CVA  Subjective:  Patient assessed at bedside alongside Dr. Karenann Cai. RD at bedside for NGT placement. Son also at bedside.  She remains intubated this afternoon.  Per RN, moving all extremities, however not following commands. Plan for possible extubation this afternoon following NGT placement.     Allergies: Patient has no known allergies.  Medications: Prior to Admission medications   Medication Sig Start Date End Date Taking? Authorizing Provider  amLODipine (NORVASC) 5 MG tablet Take 5 mg by mouth daily.   Yes [provider]  ASPIRIN PO Take 150 mg by mouth at bedtime.   Yes [provider]  atorvastatin (LIPITOR) 20 MG tablet Take 20 mg by mouth daily.   Yes [provider]  levothyroxine (SYNTHROID) 100 MCG tablet Take 50-75 mcg by mouth See admin instructions. 55mg one day, 779m the next day, then repeat.   Yes [provider]  pentoxifylline (TRENTAL) 400 MG CR tablet Take 400 mg by mouth daily.   Yes [provider]     Vital Signs: BP 115/81    Pulse (!) 104    Temp 98.9 F (37.2 C) (Axillary)    Resp 16    Ht 5' 2"  (1.575 m)    Wt 151 lb 10.8 oz (68.8 kg)    SpO2 98%    BMI 27.74 kg/m   Physical Exam NAD, alert, intubated Neuro: alert, opens eyes, grimaces with NGT manipulation by RD, does not follow commands, moving all extremities.  Groin: soft, intact. No evidence of hematoma or pseudoaneurysm.   Imaging: CT Code Stroke CTA Head W/WO contrast  Result Date: 07/19/2019 CLINICAL DATA:  Initial evaluation for acute right-sided weakness. EXAM: CT ANGIOGRAPHY HEAD AND NECK TECHNIQUE: Multidetector CT imaging of the head and neck was performed using the standard protocol during bolus administration of intravenous contrast. Multiplanar CT image  reconstructions and MIPs were obtained to evaluate the vascular anatomy. Carotid stenosis measurements (when applicable) are obtained utilizing NASCET criteria, using the distal internal carotid diameter as the denominator. CONTRAST:  8045mMNIPAQUE IOHEXOL 350 MG/ML SOLN COMPARISON:  Prior head CT from earlier same day. FINDINGS: CTA NECK FINDINGS Aortic arch: Visualized aortic arch normal in caliber with normal branch pattern. Bovine branching pattern with common origin of the right brachiocephalic and left common carotid artery noted. No hemodynamically significant stenosis seen about the origin of the great vessels. Subclavian arteries widely patent. Right carotid system: Right common and internal carotid arteries widely patent to the skull base without stenosis, dissection or occlusion. Proximal right ICA medialized into the retropharyngeal space. Focal tortuosity the distal cervical right ICA noted. Left carotid system: Left common and internal carotid arteries widely patent to the skull base without stenosis, dissection or occlusion. Proximal left ICA medialized into the retropharyngeal space. Evaluation the distal cervical left ICA limited by motion. Vertebral arteries: Both vertebral arteries arise from the subclavian arteries. Vertebral arteries mildly tortuous but widely patent within the neck without stenosis, dissection or occlusion. Skeleton: No acute osseous abnormality. No discrete lytic or blastic osseous lesions. Prominent degenerative changes noted at the C1-2 articulation, greater on the right. Other neck: No other acute soft tissue abnormality within the neck. No mass lesion or adenopathy. Upper chest: Mild scattered atelectatic changes noted within the visualized lungs. Visualized upper chest demonstrates no acute finding. Review  of the MIP images confirms the above findings CTA HEAD FINDINGS Anterior circulation: Examination mildly degraded by motion artifact. Petrous, cavernous, and  supraclinoid ICAs widely patent without flow-limiting stenosis. ICA termini well perfused. A1 segments patent bilaterally. Normal anterior communicating artery complex. Anterior cerebral arteries widely patent to their distal aspects without stenosis. Right M1 widely patent. Normal right MCA bifurcation. Distal right MCA branches well perfused. Left M1 widely patent. Normal left MCA bifurcation. Occlusive thrombus seen within a proximal left M2 branch, superior division (series 7, image 94), acute in nature. Attenuated opacification of left MCA branches distally, either due to subocclusive thrombus and/or moderate collateralization. Remainder of the left MCA branches remain patent. Posterior circulation: Vertebral arteries grossly patent to the vertebrobasilar junction without appreciable stenosis, although evaluation limited by motion. Neither PICA well visualized. Basilar diminutive but patent to its distal aspect without appreciable stenosis. Superior cerebral arteries patent bilaterally. Predominant fetal type origin of both PCAs, supplied via widely patent and robust posterior communicating arteries. Both PCAs well perfused to their distal aspects. Venous sinuses: Grossly patent allowing for timing the contrast bolus. Anatomic variants: Fetal type origin of the PCAs with overall diminutive vertebrobasilar system. No appreciable aneurysm. Review of the MIP images confirms the above findings IMPRESSION: 1. Positive CTA for emergent large vessel occlusion, with occlusive thrombus within a proximal left M2 branch, superior division. Attenuated flow distally either due to subocclusive thrombus and/or collateralization. 2. Diffuse tortuosity about the major arterial vasculature of the head and neck, suggesting chronic underlying hypertension. No other hemodynamically significant or correctable stenosis. 3. Fetal type origin of the PCAs with overall diminutive vertebrobasilar system. Critical Value/emergent results  were called by telephone at the time of interpretation on 07/19/2019 at 11:26 pm to provider ERIC [facility] , who verbally acknowledged these results. Electronically Signed   By: Jeannine Boga M.D.   On: 07/19/2019 23:53   DG Chest 1 View  Result Date: 07/20/2019 CLINICAL DATA:  Intubation EXAM: CHEST  1 VIEW COMPARISON:  None. FINDINGS: Endotracheal tube tip is positioned within the proximal right mainstem bronchus. Heart size is mildly enlarged. No focal airspace consolidation. No pleural effusion or pneumothorax. IMPRESSION: 1. Endotracheal tube tip within the proximal right mainstem bronchus. Recommend retraction approximately 3.0 cm. 2. Mild cardiomegaly. These results will be called to the ordering clinician or representative by the Radiologist Assistant, and communication documented in the PACS or zVision Dashboard. Electronically Signed   By: Davina Poke D.O.   On: 07/20/2019 10:36   CT HEAD WO CONTRAST  Result Date: 07/20/2019 CLINICAL DATA:  Follow-up stroke EXAM: CT HEAD WITHOUT CONTRAST TECHNIQUE: Contiguous axial images were obtained from the base of the skull through the vertex without intravenous contrast. COMPARISON:  Head CT earlier same day. FINDINGS: Brain: Acute infarction of the left frontal operculum, anterior insular and subinsular brain as shown previously. Low-density and swelling but no evidence of further extension of the infarction. Small amount of subarachnoid hemorrhage at the base of the brain and within the sylvian fissure on the left is becoming slightly less dense. No evidence of additional or worsening hemorrhage. No significant mass effect. One or 2 mm of left-to-right midline shift at the most. No other area of acute or subacute infarction. 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 No hydrocephalus. No extra-axial collection. No intraparenchymal hemorrhage in the region of infarction. Vascular: No new  primary vascular finding. Skull: Negative Sinuses/Orbits: Clear/normal Other: None IMPRESSION: Subarachnoid hemorrhage at the base of the  brain and sylvian fissure on the left is becoming less dense. No evidence of additional bleeding. Well-circumscribed infarction in the left frontal operculum, insula and subinsular brain as seen previously. No evidence of infarct extension. Mild regional swelling with left-to-right shift of 1-2 mm. No infarct hematoma. Electronically Signed   By: Nelson Chimes M.D.   On: 07/20/2019 21:53   CT HEAD WO CONTRAST  Result Date: 07/20/2019 CLINICAL DATA:  Stroke, follow-up. EXAM: CT HEAD WITHOUT CONTRAST TECHNIQUE: Contiguous axial images were obtained from the base of the skull through the vertex without intravenous contrast. COMPARISON:  Postprocedure head CT 07/20/2019, noncontrast head CT and CT angiogram head/neck 07/19/2019 FINDINGS: Brain: Mildly motion degraded examination. There is residual circulating contrast. There has been continued further demarcation of an acute ischemic infarct centered within the anterolateral left frontal lobe with involvement of the left frontal operculum as well as left insula and subinsular region. There may be subtle petechial hemorrhage in the infarction territory. There is no significant mass effect at this time. No midline shift. Small to moderate volume acute subarachnoid hemorrhage within the left sylvian fissure and extending to the basal cisterns does not appear significantly changed as compared postprocedural head CT 07/19/2019. No evidence of hydrocephalus. Vascular: Circulating contrast material limits evaluation for abnormal vascular hyperdensity. Skull: Normal. Negative for fracture or focal lesion. Sinuses/Orbits: Visualized orbits demonstrate no acute abnormality. Mild right maxillary sinus mucosal thickening. Redemonstrated left middle ear/mastoid effusion. IMPRESSION: Continued further demarcation of an acute ischemic infarct  centered within the anterolateral left frontal lobe with involvement of the left frontal operculum as well as left insula and subinsular region. There may be mild petechial hemorrhage in the infarction territory. No significant mass effect at this time. Small to moderate volume acute subarachnoid hemorrhage within the left sylvian fissure and extending to the basal cisterns, not significantly changed from postprocedural head CT 07/19/2019. No evidence of hydrocephalus. Electronically Signed   By: Kellie Simmering DO   On: 07/20/2019 09:10   CT Code Stroke CTA Neck W/WO contrast  Result Date: 07/19/2019 CLINICAL DATA:  Initial evaluation for acute right-sided weakness. EXAM: CT ANGIOGRAPHY HEAD AND NECK TECHNIQUE: Multidetector CT imaging of the head and neck was performed using the standard protocol during bolus administration of intravenous contrast. Multiplanar CT image reconstructions and MIPs were obtained to evaluate the vascular anatomy. Carotid stenosis measurements (when applicable) are obtained utilizing NASCET criteria, using the distal internal carotid diameter as the denominator. CONTRAST:  47m OMNIPAQUE IOHEXOL 350 MG/ML SOLN COMPARISON:  Prior head CT from earlier same day. FINDINGS: CTA NECK FINDINGS Aortic arch: Visualized aortic arch normal in caliber with normal branch pattern. Bovine branching pattern with common origin of the right brachiocephalic and left common carotid artery noted. No hemodynamically significant stenosis seen about the origin of the great vessels. Subclavian arteries widely patent. Right carotid system: Right common and internal carotid arteries widely patent to the skull base without stenosis, dissection or occlusion. Proximal right ICA medialized into the retropharyngeal space. Focal tortuosity the distal cervical right ICA noted. Left carotid system: Left common and internal carotid arteries widely patent to the skull base without stenosis, dissection or occlusion.  Proximal left ICA medialized into the retropharyngeal space. Evaluation the distal cervical left ICA limited by motion. Vertebral arteries: Both vertebral arteries arise from the subclavian arteries. Vertebral arteries mildly tortuous but widely patent within the neck without stenosis, dissection or occlusion. Skeleton: No acute osseous abnormality. No discrete lytic or blastic osseous lesions. Prominent degenerative  changes noted at the C1-2 articulation, greater on the right. Other neck: No other acute soft tissue abnormality within the neck. No mass lesion or adenopathy. Upper chest: Mild scattered atelectatic changes noted within the visualized lungs. Visualized upper chest demonstrates no acute finding. Review of the MIP images confirms the above findings CTA HEAD FINDINGS Anterior circulation: Examination mildly degraded by motion artifact. Petrous, cavernous, and supraclinoid ICAs widely patent without flow-limiting stenosis. ICA termini well perfused. A1 segments patent bilaterally. Normal anterior communicating artery complex. Anterior cerebral arteries widely patent to their distal aspects without stenosis. Right M1 widely patent. Normal right MCA bifurcation. Distal right MCA branches well perfused. Left M1 widely patent. Normal left MCA bifurcation. Occlusive thrombus seen within a proximal left M2 branch, superior division (series 7, image 94), acute in nature. Attenuated opacification of left MCA branches distally, either due to subocclusive thrombus and/or moderate collateralization. Remainder of the left MCA branches remain patent. Posterior circulation: Vertebral arteries grossly patent to the vertebrobasilar junction without appreciable stenosis, although evaluation limited by motion. Neither PICA well visualized. Basilar diminutive but patent to its distal aspect without appreciable stenosis. Superior cerebral arteries patent bilaterally. Predominant fetal type origin of both PCAs, supplied via  widely patent and robust posterior communicating arteries. Both PCAs well perfused to their distal aspects. Venous sinuses: Grossly patent allowing for timing the contrast bolus. Anatomic variants: Fetal type origin of the PCAs with overall diminutive vertebrobasilar system. No appreciable aneurysm. Review of the MIP images confirms the above findings IMPRESSION: 1. Positive CTA for emergent large vessel occlusion, with occlusive thrombus within a proximal left M2 branch, superior division. Attenuated flow distally either due to subocclusive thrombus and/or collateralization. 2. Diffuse tortuosity about the major arterial vasculature of the head and neck, suggesting chronic underlying hypertension. No other hemodynamically significant or correctable stenosis. 3. Fetal type origin of the PCAs with overall diminutive vertebrobasilar system. Critical Value/emergent results were called by telephone at the time of interpretation on 07/19/2019 at 11:26 pm to provider ERIC [facility] , who verbally acknowledged these results. Electronically Signed   By: Jeannine Boga M.D.   On: 07/19/2019 23:53   IR CT Head Ltd  Result Date: 07/20/2019 INDICATION: 74 year old female with past medical history significant for hypothyroidism presenting as a code stroke with right-sided hemiplegia and new onset atrial fibrillation. Her last known well was 21:50 on 07/19/2019. Head CT showed loss of gray-white differentiation in the left frontal for colon extending to the anterior aspect of the insula with no hemorrhage. CT angiogram showed a left M2/MCA/superior division branch occlusion. NIHSS 25. EXAM: Diagnostic cerebral angiogram Mechanical thrombectomy Flat panel head CT COMPARISON:  CT/CT angiogram of the head and neck July 19, 2019 MEDICATIONS: 5 mg of problem you have intra arterial to the left ICA. ANESTHESIA/SEDATION: General anesthesia CONTRAST:  75 mL FLUOROSCOPY TIME:  Fluoroscopy Time: 55 minutes 18 seconds (1,796  mGy). COMPLICATIONS: Moderate left sylvian subarachnoid hemorrhage TECHNIQUE: Informed written consent was obtained from the patient's son after a thorough discussion of the procedural risks, benefits and alternatives. All questions were addressed. Maximal Sterile Barrier Technique was utilized including caps, mask, sterile gowns, sterile gloves, sterile drape, hand hygiene and skin antiseptic. A timeout was performed prior to the initiation of the procedure. Using a micropuncture kit and modified Seldinger technique, access was gained to the right common femoral artery and an 8 French sheath was placed in the right common femoral artery. Under fluoroscopy, an 8 [place] Walrus balloon guide catheter was navigated over  a 6 range Berenstein 2 catheter and a 0.035 inch Terumo Glidewire into the aortic arch. Under fluoroscopy, the catheter was advanced into the cervical right internal carotid artery. The wire and 6 French catheter were removed and angiograms of the head were obtained in frontal and lateral views. FINDINGS: There is a left M2/MCA superior division branch occlusion. Prominent tortuosity of the cervical left ICA and left carotid siphon. PROCEDURE: A large bore catheter aspiration catheter was navigated through the balloon guide catheter and over a phenom 21 microcatheter and a synchro2 support microguidewire into the cavernous segment of the left ICA. The microcatheter was then advanced into the left M2/MCA superior division branch, distal to the point of occlusion. Frontal and lateral angiograms were obtained with microcatheter contrast injection. A 4 x 40 mm solitaire stent retriever was subsequently deployed spanning the distal left M1-mid M2. The device was allowed to intercalated with the clot for 4 minutes. The microcatheter was removed. The guiding catheter balloon was inflated and constant aspiration was performed, the aspiration catheter was navigated near the occlusion and connected to a penumbra  aspiration pump. The stent retriever was then retracted along with the aspiration catheter. Frontal and lateral angiograms were obtained showing persistent left M2/MCA occlusion. In a similar fashion and using the same platform, a second stent retriever pass was performed with the solitaire device. Frontal and lateral angiograms were obtained showing persistent left M2/MCA occlusion with the clot migrating proximally. A catalyst 6 aspiration catheter was navigated through the balloon guide catheter and over a phenom 21 microcatheter and posterior 0.014 inch Aristotle microguidewire into the cavernous segment of the left ICA. The microcatheter was then advanced into the left M2/MCA superior division branch, distal to the point of occlusion. Frontal and lateral angiograms were obtained with microcatheter contrast injection. A 4 x 41 mm trevo stent retriever was subsequently deployed spanning the distal left M1-mid M2. The device was allowed to intercalated with the clot for 4 minutes. The microcatheter was removed. The guiding catheter balloon was inflated and constant aspiration was performed, the aspiration catheter was navigated near the occlusion and connected to a penumbra aspiration pump. The stent retriever was then retracted along with the aspiration catheter. Frontal and lateral angiograms were obtained showing distal contrast penetration into the left M2/MCA superior division branch with severe vasospasm. Intra arterial infusion of 5 mg of verapamil was performed over 10 minutes. Follow-up angiogram showed reocclusion of the left M2/MCA superior division branch. A 3 max aspiration catheter was navigated through the balloon guide catheter and over the Aristotle microguidewire into the left and 2/MCA superior division branch at the level of occlusion. Continuous is aspiration was performed for 4 minutes. The guiding catheter balloon was inflated and the aspiration catheter was then removed. Follow-up left ICA  angiograms with frontal and lateral views showed recanalization of the left M2/MCA superior division branch with slow flow in the distal angular branch (TICI 2C). A flat panel CT of the head was then obtained. Moderate left sylvian subarachnoid hemorrhage was identified. The catheter was then removed. A right common femoral artery angiogram was obtained via sheath side port. A 6 French Perclose ProGlide was utilized for right, femoral access closure. Immediate hemostasis was achieved. Patient was transferred to ICU for continued monitoring. Family and neurology team communicated immediately after the end of the procedure. IMPRESSION: 1. Left M2/MCA superior division branch occlusion. 2. Successful mechanical thrombectomy/aspiration performed with TICI 2C final recanalization. A total of 3 stent retriever passes and 1  aspiration pass performed. 3. Moderate size left sylvian subarachnoid hemorrhage seen on postprocedure flat panel CT. PLAN: Continued monitoring in ICU level of care. Repeat head CT within 4 hours to evaluate for subarachnoid hemorrhage stability. Electronically Signed   By: Pedro Earls M.D.   On: 07/20/2019 09:27   DG Chest Port 1 View  Result Date: 07/21/2019 CLINICAL DATA:  Respiratory failure. Stroke. EXAM: PORTABLE CHEST 1 VIEW COMPARISON:  One-view chest x-ray 07/19/2018 FINDINGS: Endotracheal tube is been pulled back, now terminating at the clavicles, well above the carina. Lung volumes remain low. Pulmonary vascular congestion is unchanged. IMPRESSION: 1. Repositioning of endotracheal tube now in satisfactory position. 2. Stable low lung volumes and moderate pulmonary vascular congestion. Electronically Signed   By: San Morelle M.D.   On: 07/21/2019 08:36   ECHOCARDIOGRAM COMPLETE  Result Date: 07/20/2019    ECHOCARDIOGRAM REPORT   Patient Name:   Jamie Mitchell Date of Exam: 07/20/2019 Medical Rec #:  [id number]               Height:       62.0 in  Accession #:    [id number]              Weight:       151.7 lb Date of Birth:  07-12-1945              BSA:          1.700 m Patient Age:    74 years                BP:           124/68 mmHg Patient Gender: F                       HR:           79 bpm. Exam Location:  Inpatient Procedure: 2D Echo, Cardiac Doppler and Color Doppler STAT ECHO Indications:    CVA  History:        Patient has no prior history of Echocardiogram examinations. PAD                 and Stroke, Arrythmias:Atrial Fibrillation and Tachy-Brady; Risk                 Factors:Hypertension and Dyslipidemia.  Sonographer:    Dustin Flock Referring Phys: [place]  1. Left ventricular ejection fraction, by estimation, is 60 to 65%. The left ventricle has normal function. The left ventricle has no regional wall motion abnormalities. Left ventricular diastolic parameters were normal.  2. Right ventricular systolic function is normal. The right ventricular size is normal. Tricuspid regurgitation signal is inadequate for assessing PA pressure.  3. The mitral valve is normal in structure and function. Mild mitral valve regurgitation. No evidence of mitral stenosis.  4. The aortic valve is normal in structure and function. Aortic valve regurgitation is not visualized. No aortic stenosis is present.  5. The inferior vena cava is normal in size with greater than 50% respiratory variability, suggesting right atrial pressure of 3 mmHg. FINDINGS  Left Ventricle: Left ventricular ejection fraction, by estimation, is 60 to 65%. The left ventricle has normal function. The left ventricle has no regional wall motion abnormalities. The left ventricular internal cavity size was normal in size. There is  no left ventricular hypertrophy. Left ventricular diastolic parameters were normal. Normal left ventricular filling pressure. Right Ventricle: The right ventricular  size is normal. No increase in right ventricular wall thickness. Right  ventricular systolic function is normal. Tricuspid regurgitation signal is inadequate for assessing PA pressure. Left Atrium: Left atrial size was normal in size. Right Atrium: Right atrial size was normal in size. Pericardium: There is no evidence of pericardial effusion. Mitral Valve: The mitral valve is normal in structure and function. Normal mobility of the mitral valve leaflets. Mild mitral valve regurgitation. No evidence of mitral valve stenosis. Tricuspid Valve: The tricuspid valve is normal in structure. Tricuspid valve regurgitation is trivial. No evidence of tricuspid stenosis. Aortic Valve: The aortic valve is normal in structure and function. Aortic valve regurgitation is not visualized. No aortic stenosis is present. Pulmonic Valve: The pulmonic valve was normal in structure. Pulmonic valve regurgitation is not visualized. No evidence of pulmonic stenosis. Aorta: The aortic root is normal in size and structure. Venous: IVC assessment for right atrial pressure unable to be performed due to mechanical ventilation. The inferior vena cava is normal in size with greater than 50% respiratory variability, suggesting right atrial pressure of 3 mmHg. IAS/Shunts: No atrial level shunt detected by color flow Doppler.  LEFT VENTRICLE PLAX 2D LVIDd:         4.50 cm  Diastology LVIDs:         2.80 cm  LV e' lateral:   11.30 cm/s LV PW:         1.10 cm  LV E/e' lateral: 9.5 LV IVS:        1.20 cm  LV e' medial:    9.68 cm/s LVOT diam:     2.00 cm  LV E/e' medial:  11.1 LV SV:         61 LV SV Index:   36 LVOT Area:     3.14 cm  RIGHT VENTRICLE RV Basal diam:  2.90 cm RV S prime:     13.80 cm/s TAPSE (M-mode): 3.3 cm LEFT ATRIUM             Index       RIGHT ATRIUM           Index LA diam:        4.50 cm 2.65 cm/m  RA Area:     11.90 cm LA Vol (A2C):   45.3 ml 26.65 ml/m RA Volume:   26.60 ml  15.65 ml/m LA Vol (A4C):   40.5 ml 23.83 ml/m LA Biplane Vol: 45.7 ml 26.89 ml/m  AORTIC VALVE LVOT Vmax:   84.90 cm/s  LVOT Vmean:  52.600 cm/s LVOT VTI:    0.195 m  AORTA Ao Root diam: 2.60 cm MITRAL VALVE MV Area (PHT): 3.37 cm     SHUNTS MV Decel Time: 225 msec     Systemic VTI:  0.20 m MV E velocity: 107.00 cm/s  Systemic Diam: 2.00 cm MV A velocity: 66.80 cm/s MV E/A ratio:  1.60 Fransico Him MD Electronically signed by Fransico Him MD Signature Date/Time: 07/20/2019/12:37:18 PM    Final    IR PERCUTANEOUS ART THROMBECTOMY/INFUSION INTRACRANIAL INC DIAG ANGIO  Result Date: 07/20/2019 INDICATION: 74 year old female with past medical history significant for hypothyroidism presenting as a code stroke with right-sided hemiplegia and new onset atrial fibrillation. Her last known well was 21:50 on 07/19/2019. Head CT showed loss of gray-white differentiation in the left frontal for colon extending to the anterior aspect of the insula with no hemorrhage. CT angiogram showed a left M2/MCA/superior division branch occlusion. NIHSS 25. EXAM: Diagnostic cerebral angiogram Mechanical  thrombectomy Flat panel head CT COMPARISON:  CT/CT angiogram of the head and neck July 19, 2019 MEDICATIONS: 5 mg of problem you have intra arterial to the left ICA. ANESTHESIA/SEDATION: General anesthesia CONTRAST:  75 mL FLUOROSCOPY TIME:  Fluoroscopy Time: 55 minutes 18 seconds (1,796 mGy). COMPLICATIONS: Moderate left sylvian subarachnoid hemorrhage TECHNIQUE: Informed written consent was obtained from the patient's son after a thorough discussion of the procedural risks, benefits and alternatives. All questions were addressed. Maximal Sterile Barrier Technique was utilized including caps, mask, sterile gowns, sterile gloves, sterile drape, hand hygiene and skin antiseptic. A timeout was performed prior to the initiation of the procedure. Using a micropuncture kit and modified Seldinger technique, access was gained to the right common femoral artery and an 8 French sheath was placed in the right common femoral artery. Under fluoroscopy, an 8 [place]  Walrus balloon guide catheter was navigated over a 6 range Berenstein 2 catheter and a 0.035 inch Terumo Glidewire into the aortic arch. Under fluoroscopy, the catheter was advanced into the cervical right internal carotid artery. The wire and 6 French catheter were removed and angiograms of the head were obtained in frontal and lateral views. FINDINGS: There is a left M2/MCA superior division branch occlusion. Prominent tortuosity of the cervical left ICA and left carotid siphon. PROCEDURE: A large bore catheter aspiration catheter was navigated through the balloon guide catheter and over a phenom 21 microcatheter and a synchro2 support microguidewire into the cavernous segment of the left ICA. The microcatheter was then advanced into the left M2/MCA superior division branch, distal to the point of occlusion. Frontal and lateral angiograms were obtained with microcatheter contrast injection. A 4 x 40 mm solitaire stent retriever was subsequently deployed spanning the distal left M1-mid M2. The device was allowed to intercalated with the clot for 4 minutes. The microcatheter was removed. The guiding catheter balloon was inflated and constant aspiration was performed, the aspiration catheter was navigated near the occlusion and connected to a penumbra aspiration pump. The stent retriever was then retracted along with the aspiration catheter. Frontal and lateral angiograms were obtained showing persistent left M2/MCA occlusion. In a similar fashion and using the same platform, a second stent retriever pass was performed with the solitaire device. Frontal and lateral angiograms were obtained showing persistent left M2/MCA occlusion with the clot migrating proximally. A catalyst 6 aspiration catheter was navigated through the balloon guide catheter and over a phenom 21 microcatheter and posterior 0.014 inch Aristotle microguidewire into the cavernous segment of the left ICA. The microcatheter was then advanced into the  left M2/MCA superior division branch, distal to the point of occlusion. Frontal and lateral angiograms were obtained with microcatheter contrast injection. A 4 x 41 mm trevo stent retriever was subsequently deployed spanning the distal left M1-mid M2. The device was allowed to intercalated with the clot for 4 minutes. The microcatheter was removed. The guiding catheter balloon was inflated and constant aspiration was performed, the aspiration catheter was navigated near the occlusion and connected to a penumbra aspiration pump. The stent retriever was then retracted along with the aspiration catheter. Frontal and lateral angiograms were obtained showing distal contrast penetration into the left M2/MCA superior division branch with severe vasospasm. Intra arterial infusion of 5 mg of verapamil was performed over 10 minutes. Follow-up angiogram showed reocclusion of the left M2/MCA superior division branch. A 3 max aspiration catheter was navigated through the balloon guide catheter and over the Aristotle microguidewire into the left and 2/MCA superior division  branch at the level of occlusion. Continuous is aspiration was performed for 4 minutes. The guiding catheter balloon was inflated and the aspiration catheter was then removed. Follow-up left ICA angiograms with frontal and lateral views showed recanalization of the left M2/MCA superior division branch with slow flow in the distal angular branch (TICI 2C). A flat panel CT of the head was then obtained. Moderate left sylvian subarachnoid hemorrhage was identified. The catheter was then removed. A right common femoral artery angiogram was obtained via sheath side port. A 6 French Perclose ProGlide was utilized for right, femoral access closure. Immediate hemostasis was achieved. Patient was transferred to ICU for continued monitoring. Family and neurology team communicated immediately after the end of the procedure. IMPRESSION: 1. Left M2/MCA superior division  branch occlusion. 2. Successful mechanical thrombectomy/aspiration performed with TICI 2C final recanalization. A total of 3 stent retriever passes and 1 aspiration pass performed. 3. Moderate size left sylvian subarachnoid hemorrhage seen on postprocedure flat panel CT. PLAN: Continued monitoring in ICU level of care. Repeat head CT within 4 hours to evaluate for subarachnoid hemorrhage stability. Electronically Signed   By: Pedro Earls M.D.   On: 07/20/2019 09:27   CT HEAD CODE STROKE WO CONTRAST  Result Date: 07/19/2019 CLINICAL DATA:  Code stroke. Initial evaluation for right-sided facial droop with right-sided weakness. EXAM: CT HEAD WITHOUT CONTRAST TECHNIQUE: Contiguous axial images were obtained from the base of the skull through the vertex without intravenous contrast. COMPARISON:  None. FINDINGS: Brain: Mild age-related cerebral atrophy. Small focus of encephalomalacia involving the cortical/subcortical high left frontal lobe consistent with a small chronic left MCA territory infarct (series 3, image 25). Few scattered parenchymal calcifications noted at the right frontal and left parietal region. There is subtle loss of gray-white matter differentiation involving the region of the left frontal operculum, suspicious for early/developing acute left MCA territory infarct. Left insula relatively well maintained at this time as are the deep gray nuclei. No intracranial hemorrhage or mass effect. No other acute large vessel territory infarct. No mass lesion or midline shift. No hydrocephalus. No extra-axial fluid collection. Vascular: Asymmetric hyperdensity seen M2 branch (series 6, image 45), suspicious for LV of. Of distal left M1 and/or proximal Skull: Scalp soft tissues and calvarium within normal limits. Sinuses/Orbits: Left gaze noted. Globes and orbital soft tissues otherwise unremarkable. Mild mucosal thickening noted within the right sphenoid sinus. Chronic appearing left mastoid  and middle ear effusion noted. Visualized nasopharynx within normal limits. Other: None. ASPECTS [facility] Stroke Program Early CT Score) - Ganglionic level infarction (caudate, lentiform nuclei, internal capsule, insula, M1-M3 cortex): 7 - Supraganglionic infarction (M4-M6 cortex): 2 Total score (0-10 with 10 being normal): 9 IMPRESSION: 1. Subtle loss of gray-white matter differentiation involving the left frontal operculum, concerning for acute left MCA territory infarct. Hyperdensity at the level of a distal left M1/proximal M2 branch concerning for LVO. Further assessment with dedicated CTA recommended. No intracranial hemorrhage. 2. ASPECTS is 9. 3. Small chronic high left frontal infarct. Critical Value/emergent results were called by telephone at the time of interpretation on 07/19/2019 at 11:10 pm to provider ERIC [facility] , who verbally acknowledged these results. Electronically Signed   By: Jeannine Boga M.D.   On: 07/19/2019 23:35    Labs:  CBC: Recent Labs    07/19/19 2300 07/19/19 2305 07/20/19 0356 07/20/19 0426 07/20/19 0901 07/21/19 0405  WBC 6.9  --  9.2  --   --  9.4  HGB 14.8   < >  13.7 14.3 13.3 12.2  HCT 45.4   < > 42.3 42.0 39.0 39.0  PLT 256  --  264  --   --  214   < > = values in this interval not displayed.    COAGS: Recent Labs    07/19/19 2300  INR 1.0  APTT 30    BMP: Recent Labs    07/19/19 2300 07/19/19 2300 07/19/19 2305 07/19/19 2305 07/20/19 0356 07/20/19 0356 07/20/19 0426 07/20/19 0901 07/20/19 1921 07/21/19 0405  NA 140   < > 140   < > 137   < > 142 143 142 145  K 4.3   < > 4.2   < > 4.0   < > 4.0 4.0 4.2 4.3  CL 107   < > 105  --  110  --   --   --  115* 118*  CO2 24  --   --   --  20*  --   --   --  17* 19*  GLUCOSE 105*   < > 101*  --  192*  --   --   --  129* 125*  BUN 18   < > 19  --  14  --   --   --  11 12  CALCIUM 9.2  --   --   --  7.7*  --   --   --  8.3* 8.1*  CREATININE 0.92   < > 0.90  --  0.82  --   --   --  0.88  1.43*  GFRNONAA >60  --   --   --  >60  --   --   --  >60 37*  GFRAA >60  --   --   --  >60  --   --   --  >60 43*   < > = values in this interval not displayed.    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 Moderate left sylvian SAH noted on post-procedure CT. Patient assessed at bedside alongside Dr. Karenann Cai.  She remains intubated, however per RN is tolerating weaning with plans for possible extubation this afternoon.  She does not follow commands. Currently in mittens. Moving all extremities.  Groin soft , intact.  Continues to have dysrhythmias with ongoing evaluation by Cardiology.   NIR remains available as needed.   Electronically Signed: Docia Barrier, PA 07/21/2019, 3:27 PM   I spent a total of 15 Minutes at the the patient's bedside AND on the patient's hospital floor or unit, greater than 50% of which was counseling/coordinating care for L MCA CVA.

## 2019-07-22 ENCOUNTER — Inpatient Hospital Stay (HOSPITAL_COMMUNITY): Payer: Medicare Other

## 2019-07-22 DIAGNOSIS — Z9911 Dependence on respirator [ventilator] status: Secondary | ICD-10-CM

## 2019-07-22 DIAGNOSIS — I63412 Cerebral infarction due to embolism of left middle cerebral artery: Principal | ICD-10-CM

## 2019-07-22 DIAGNOSIS — Z978 Presence of other specified devices: Secondary | ICD-10-CM

## 2019-07-22 LAB — GLUCOSE, CAPILLARY
Glucose-Capillary: 130 mg/dL — ABNORMAL HIGH (ref 70–99)
Glucose-Capillary: 130 mg/dL — ABNORMAL HIGH (ref 70–99)
Glucose-Capillary: 120 mg/dL — ABNORMAL HIGH (ref 70–99)
Glucose-Capillary: 132 mg/dL — ABNORMAL HIGH (ref 70–99)
Glucose-Capillary: 119 mg/dL — ABNORMAL HIGH (ref 70–99)
Glucose-Capillary: 141 mg/dL — ABNORMAL HIGH (ref 70–99)

## 2019-07-22 LAB — BASIC METABOLIC PANEL
Anion gap: 7 (ref 5–15)
BUN: 13 mg/dL (ref 8–23)
CO2: 23 mmol/L (ref 22–32)
Calcium: 8.2 mg/dL — ABNORMAL LOW (ref 8.9–10.3)
Chloride: 112 mmol/L — ABNORMAL HIGH (ref 98–111)
Creatinine, Ser: 1.01 mg/dL — ABNORMAL HIGH (ref 0.44–1.00)
GFR calc Af Amer: >60 mL/min (ref >60)
GFR calc non Af Amer: 56 mL/min — ABNORMAL LOW (ref >60)
Glucose, Bld: 168 mg/dL — ABNORMAL HIGH (ref 70–99)
Potassium: 3.6 mmol/L (ref 3.5–5.1)
Sodium: 142 mmol/L (ref 135–145)

## 2019-07-22 LAB — CBC
HCT: 37.3 % (ref 36.0–46.0)
Hemoglobin: 11.8 g/dL — ABNORMAL LOW (ref 12.0–15.0)
MCH: 29.6 pg (ref 26.0–34.0)
MCHC: 31.6 g/dL (ref 30.0–36.0)
MCV: 93.7 fL (ref 80.0–100.0)
Platelets: 147 10*3/uL — ABNORMAL LOW (ref 150–400)
RBC: 3.98 MIL/uL (ref 3.87–5.11)
RDW: 14.4 % (ref 11.5–15.5)
WBC: 10.1 10*3/uL (ref 4.0–10.5)
nRBC: 0 % (ref 0.0–0.2)

## 2019-07-22 LAB — PHOSPHORUS
Phosphorus: 3.4 mg/dL (ref 2.5–4.6)
Phosphorus: 3.5 mg/dL (ref 2.5–4.6)

## 2019-07-22 LAB — MAGNESIUM
Magnesium: 2.2 mg/dL (ref 1.7–2.4)
Magnesium: 2.2 mg/dL (ref 1.7–2.4)

## 2019-07-22 LAB — CALCIUM, IONIZED: Calcium, Ionized, Serum: 4.5 mg/dL (ref 4.5–5.6)

## 2019-07-22 IMAGING — MR MR HEAD W/O CM
8 of 11 series · 24 of 48 positions shown · non-contrast
Comparison: Head CT [DATE] and CTA [DATE]

CLINICAL DATA: Stroke follow-up. Proximal left M2 occlusion status
post revascularization.

EXAM:
MRI HEAD WITHOUT CONTRAST
MRA HEAD WITHOUT CONTRAST
TECHNIQUE: Multiplanar, multiecho pulse sequences of the brain and surrounding
structures were obtained without intravenous contrast. Angiographic
images of the head were obtained using MRA technique without
contrast.

[Series 3: DWI · axial · 3.0mm · 0.94mm/px · z∈[-99,+53]mm · 6 of 104 slices shown (1 of 2)]
[im 1/104]
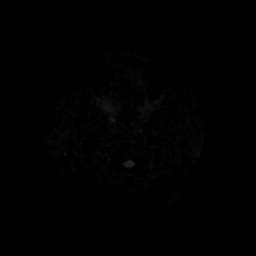
[im 21/104]
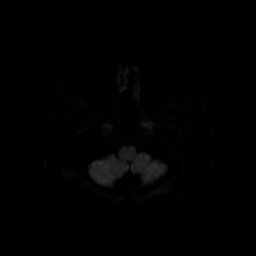
[im 42/104]
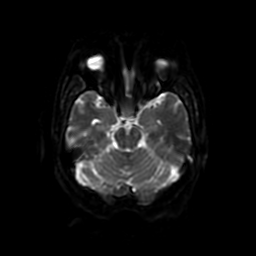
[im 62/104]
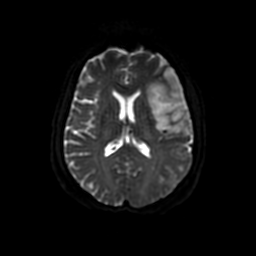
[im 83/104]
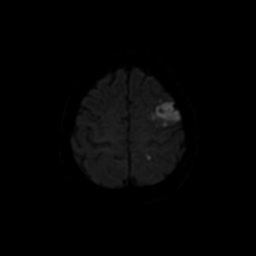
[im 104/104]
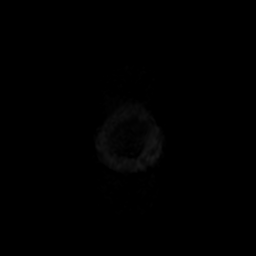

[Series 4: DWI · coronal · 4.0mm · 0.94mm/px · 5 of 68 slices shown (2 of 2)]
[im 1/68]
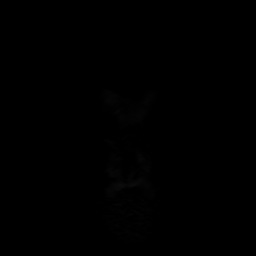
[im 17/68]
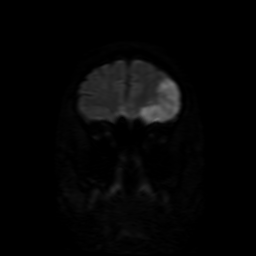
[im 34/68]
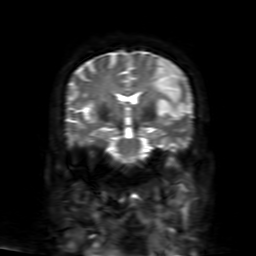
[im 51/68]
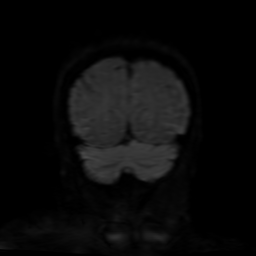
[im 68/68]
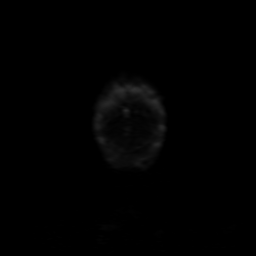

[Series 5: FLAIR · sagittal · 5.0mm · 0.47mm/px · 2 of 25 slices shown (1 of 2)]
[im 1/25]
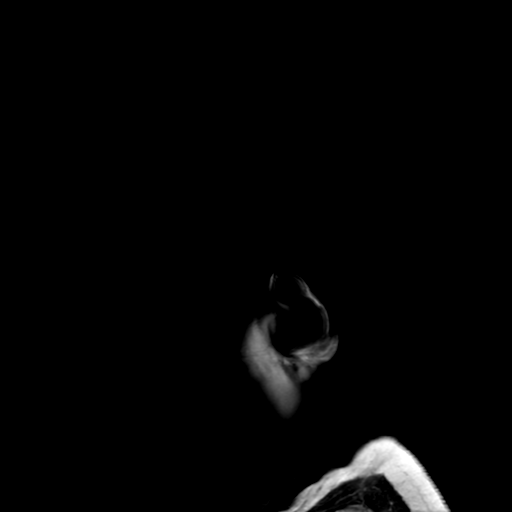
[im 25/25]
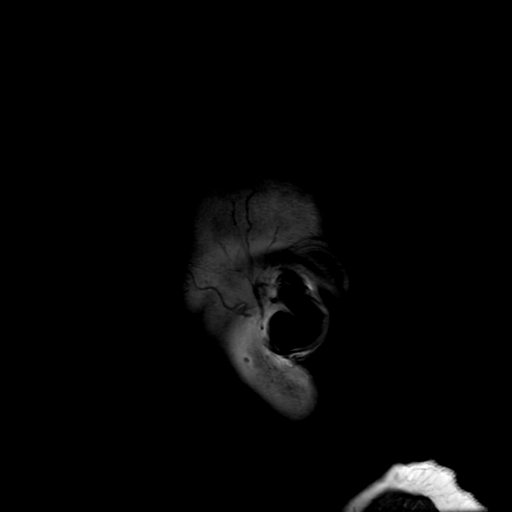

[Series 6: T2 · axial · 5.0mm · 0.23mm/px · z∈[-97,+52]mm · 2 of 26 slices shown (1 of 2)]
[im 1/26]
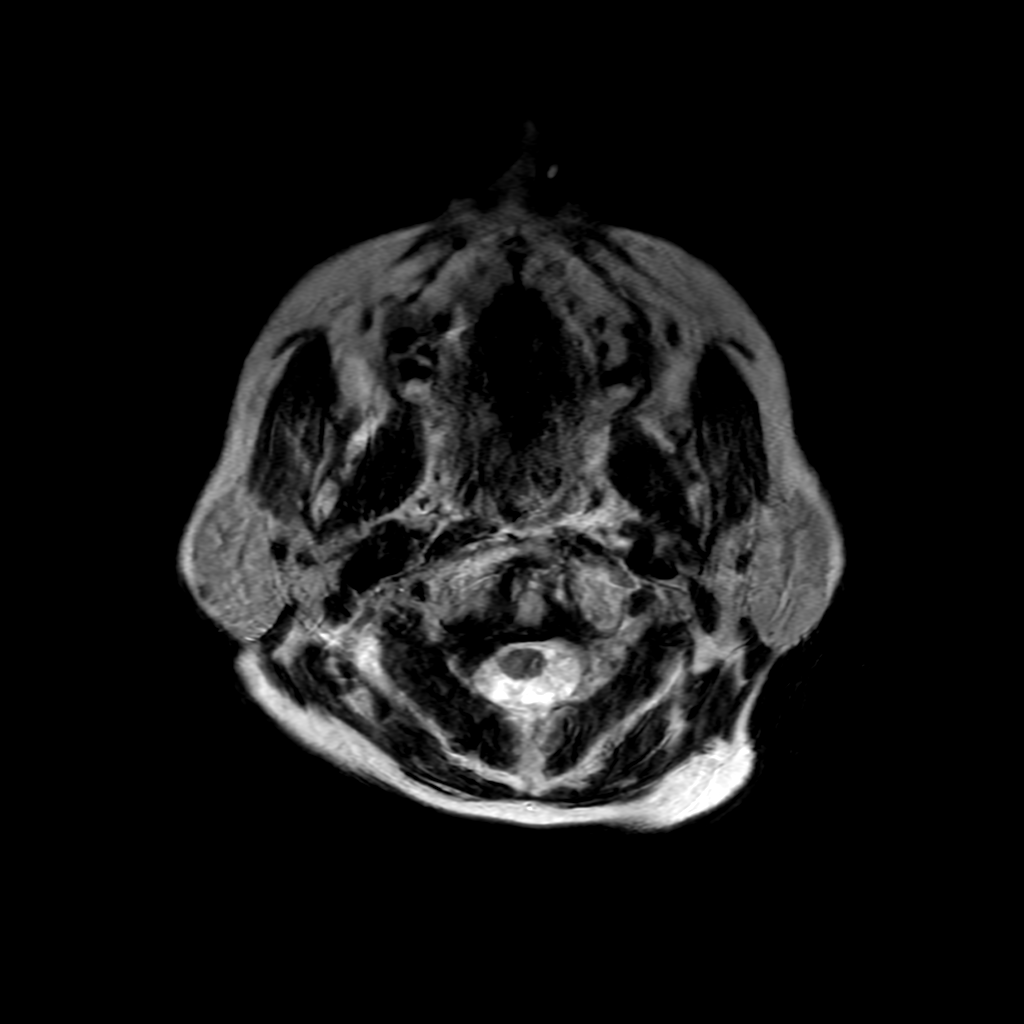
[im 26/26]
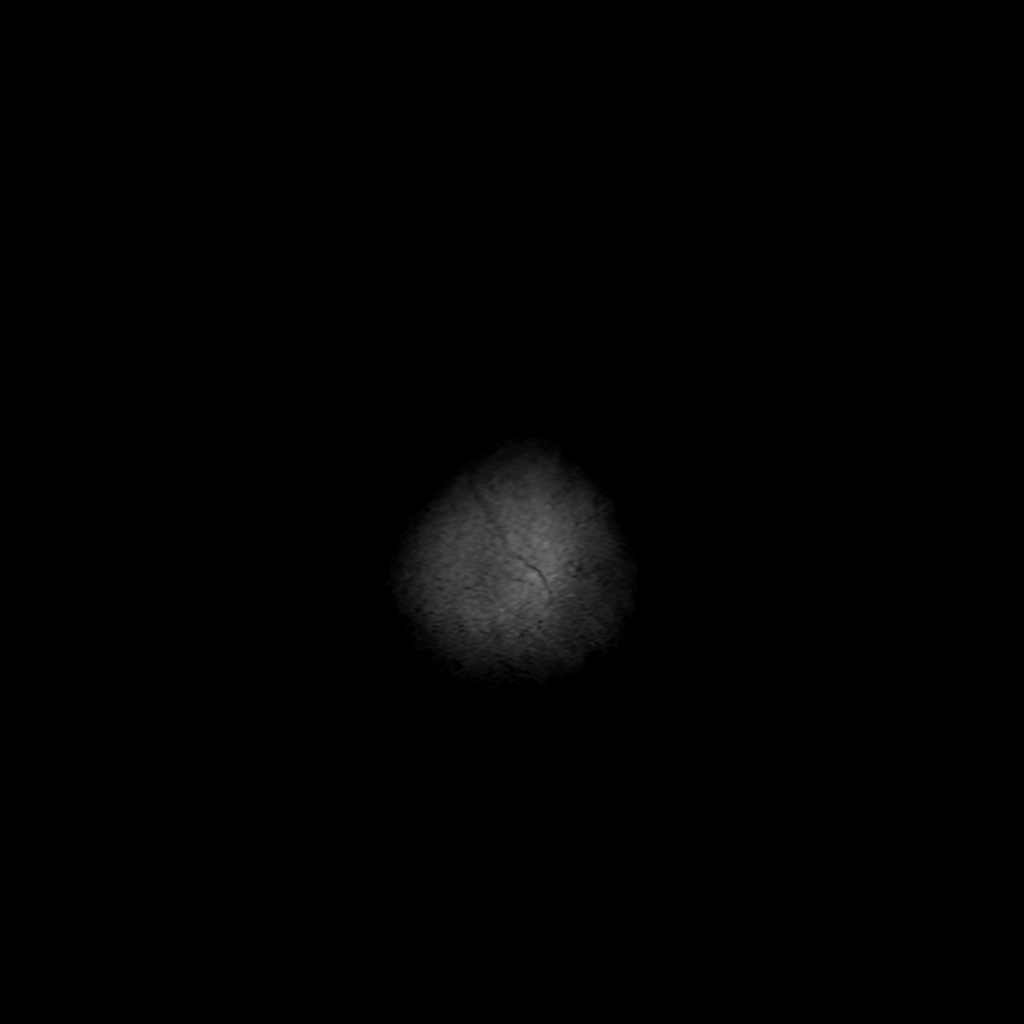

[Series 7: FLAIR · axial · 3.0mm · 0.47mm/px · z∈[-97,+52]mm · 2 of 26 slices shown (2 of 2)]
[im 1/26]
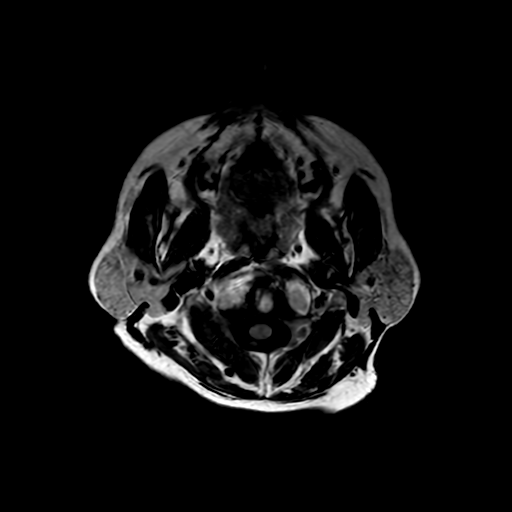
[im 26/26]
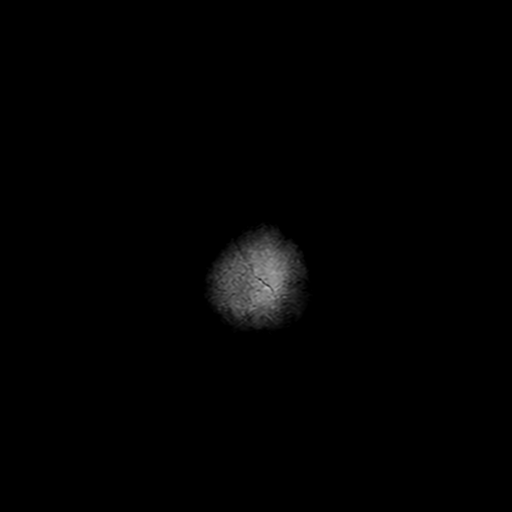

[Series 10: T2 · coronal · 5.0mm · 0.39mm/px · 1 of 28 slices shown (2 of 2)]
[im 1/28]
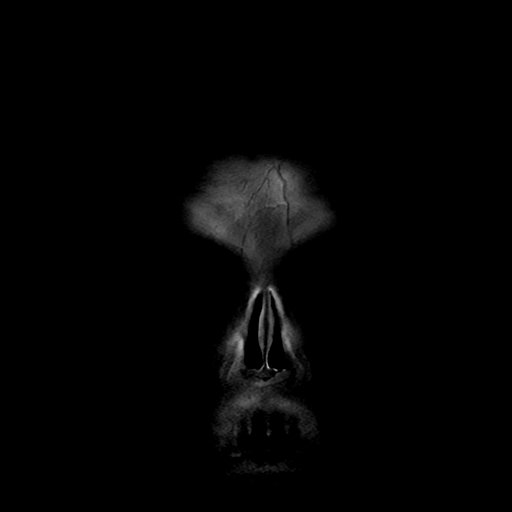

[Series 350: ADC · axial · 3.0mm · 0.94mm/px · z∈[-99,+53]mm · 4 of 52 slices shown (1 of 2)]
[im 1/52]
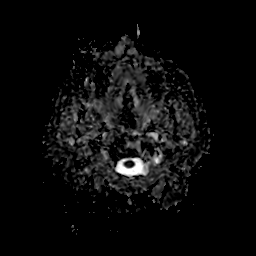
[im 18/52]
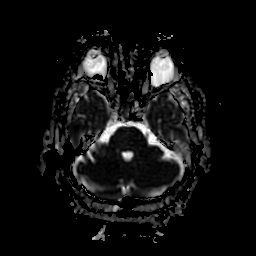
[im 35/52]
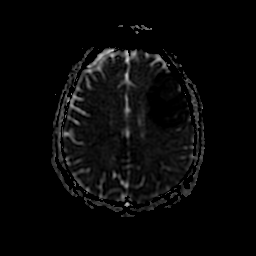
[im 52/52]
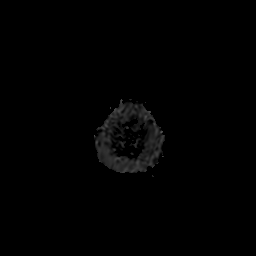

[Series 450: ADC · coronal · 4.0mm · 0.94mm/px · 2 of 34 slices shown (2 of 2)]
[im 1/34]
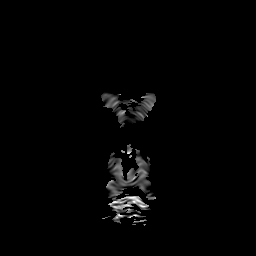
[im 34/34]
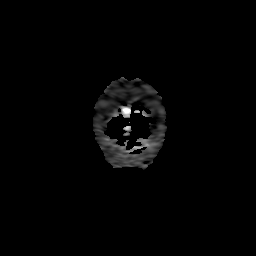

[24 of 48 positions shown; findings below may reference images not displayed]

FINDINGS: MRI HEAD FINDINGS

The study is mildly motion degraded.

Brain: A large acute infarct is again noted in the left MCA superior
division involving the lateral and anterior aspects of the frontal
lobe and insula with cytotoxic edema, minimal petechial hemorrhage,
and unchanged trace rightward midline shift. Scattered punctate
acute infarcts are also present in the left parietal and superior
occipital lobes. Susceptibility artifact in the left sylvian fissure
and FLAIR signal within multiple sulci of the posterior left greater
than right cerebral hemispheres is compatible with previously
demonstrated small volume subarachnoid hemorrhage. Several scattered
chronic microhemorrhages are noted peripherally in both cerebral
hemispheres. There is no extra-axial fluid collection.

Vascular: Major intracranial vascular flow voids are preserved.

Skull and upper cervical spine: Unremarkable bone marrow signal.

Sinuses/Orbits: Unremarkable orbits. Moderate left mastoid effusion.
Clear paranasal sinuses.

Other: None.

MRA HEAD FINDINGS

The visualized distal vertebral arteries are widely patent to the
basilar and codominant. Patent AICAs and SCA is are seen
bilaterally. The basilar artery is widely patent. There are large
posterior communicating arteries bilaterally with hypoplastic right
and absent left P1 segments. Both PCAs are patent without evidence
of significant proximal stenosis.

The internal carotid arteries are widely patent from skull base to
carotid termini with ectasias of both cavernous segments. ACAs and
MCAs are patent without evidence of significant A1 or M1 stenosis.
The proximal left M2 superior division remains patent following
revascularization, however there is a severe stenosis near/slightly
distal to the site of previous occlusion. There is also asymmetric
attenuation of more distal branch vessels in the left MCA superior
division. No aneurysm is identified.
IMPRESSION: 1. Large acute left MCA superior division infarct with minimal
petechial hemorrhage and unchanged minimal midline shift.
2. Punctate acute left parietal and occipital infarcts.
3. Residual small volume subarachnoid hemorrhage.
4. Severe left MCA superior division branch vessel stenosis near the
site of previous occlusion.

## 2019-07-22 MED ORDER — CHLORHEXIDINE GLUCONATE 0.12 % MT SOLN
15.0000 mL | Freq: Two times a day (BID) | OROMUCOSAL | Status: DC
  Administered 2019-07-22 – 2019-07-25 (×6): 15 mL via OROMUCOSAL
  Filled 2019-07-22 (×6): qty 15

## 2019-07-22 MED ORDER — CHLORHEXIDINE GLUCONATE CLOTH 2 % EX PADS
6.0000 | MEDICATED_PAD | Freq: Every day | CUTANEOUS | Status: DC
  Administered 2019-07-23 – 2019-07-24 (×2): 6 via TOPICAL

## 2019-07-22 MED ORDER — ORAL CARE MOUTH RINSE
15.0000 mL | Freq: Two times a day (BID) | OROMUCOSAL | Status: DC
  Administered 2019-07-22 – 2019-07-25 (×5): 15 mL via OROMUCOSAL

## 2019-07-22 MED ORDER — SODIUM CHLORIDE 0.9% FLUSH: 10.0000 mL | INTRAVENOUS | Status: DC | PRN

## 2019-07-22 MED FILL — Medication: Qty: 1 | Status: AC

## 2019-07-22 NOTE — Progress Notes (Signed)
NAME:  Jamie Mitchell, MRN:  [id number], DOB:  Apr 21, 1946, LOS: 2 ADMISSION DATE:  07/19/2019, CONSULTATION DATE:  07/19/2018 REFERRING MD:  Neurology, CHIEF COMPLAINT:  Ventilator and medical management   Brief History   74 yo F who presented with right sided weakness found to have acute left M2/ MCA occlusion s/p tPA and emergent thrombectomy which was complicated by moderate left sylvian SAH.  On 2/25 patient with Afib/ Aflutter with RVR and post conversion pauses.  EP consulted and monitoring.  SAH stable on CTH thus far.   History of present illness   HPI unable to obtain from patient due to intubated status.  From chart review, patient was last known normal at 2150, found 2215 by family with acute right sided weakness, left gaze, dysarthria.  History of Afib in the remote past, no history of strokes.  She presented to the ED as a CODE STROKE and found to have no hemorrhage on CT head, and tPA was then initiated.  During tPA infusion, CTA head was done showing M2 occlusion.  She was taken for thrombectomy and revascularization, which was complicated by subarachnoid hemorrhage (moderate left sylvian SAH).  She was kept intubated and sedated on propofol and sent to neuro ICU.  On arrival to the ICU, patient was in afib and intermittent bradycardic with HR in the 50s.  She was mildly hypotensive with MAPs ~70.  She was on high dose propofol.      Past Medical History  Hypothyroidism HTN Paroxysmal Afib  Significant Hospital Events   tPA 2/24, 2317 L M2/MCA thrombectomy 2/24  Consults:  PCCM Cards/ EP   Procedures:  2/25  L M2/MCA thrombectomy 2/24  2/25 ETT >> 2/26 R radial Aline >>  Significant Diagnostic Tests:  2/24 [facility] code stroke >> 1. Subtle loss of gray-white matter differentiation involving the left frontal operculum, concerning for acute left MCA territory infarct. Hyperdensity at the level of a distal left M1/proximal M2 branch concerning for LVO. Further  assessment with dedicated CTA recommended. No intracranial hemorrhage. 2. ASPECTS is 9. 3. Small chronic high left frontal infarct.  2/24 CTA head and neck: 1. Positive CTA for emergent large vessel occlusion, with occlusive thrombus within a proximal left M2 branch, superior division.  Attenuated flow distally either due to subocclusive thrombus and/or  collateralization. 2. Diffuse tortuosity about the major arterial vasculature of the head and neck, suggesting chronic underlying hypertension. No other hemodynamically significant or correctable stenosis. 3. Fetal type origin of the PCAs with overall diminutive vertebrobasilar system.  2/25 CTH >> Continued further demarcation of an acute ischemic infarct centered within the anterolateral left frontal lobe with involvement of the left frontal operculum as well as left insula and subinsular region. There may be mild petechial hemorrhage in the infarction territory. No significant mass effect at this time.  Small to moderate volume acute subarachnoid hemorrhage within the left sylvian fissure and extending to the basal cisterns, not significantly changed from postprocedural head CT 07/19/2019. No evidence of hydrocephalus.  2/25 TTE >> 1. Left ventricular ejection fraction, by estimation, is 60 to 65%. The left ventricle has normal function. The left ventricle has no regional wall motion abnormalities. Left ventricular diastolic parameters were normal.  2. Right ventricular systolic function is normal. The right ventricular size is normal. Tricuspid regurgitation signal is inadequate for assessing PA pressure.  3. The mitral valve is normal in structure and function. Mild mitral valve regurgitation. No evidence of mitral stenosis.  4. The aortic  valve is normal in structure and function. Aortic valve regurgitation is not visualized. No aortic stenosis is present.  5. The inferior vena cava is normal in size with greater than 50% respiratory  variability, suggesting right atrial pressure of 3 mmHg.   2/25 CTH >> Subarachnoid hemorrhage at the base of the brain and sylvian fissure on the left is becoming less dense. No evidence of additional bleeding.  Well-circumscribed infarction in the left frontal operculum, insula and subinsular brain as seen previously. No evidence of infarct extension. Mild regional swelling with left-to-right shift of 1-2 mm. No infarct hematoma.  Micro Data:  2/25 Sars Cov2 negative 2/25 Flu A/ B negative 2/25 MRSA PCR >> neg  Antimicrobials:  2/25 cefazolin  Interim history/subjective:   Patient tolerating SAT SBT this morning.  Remains intubated on mechanical life support.  Objective   Blood pressure 113/71, pulse 90, temperature 99.1 F (37.3 C), temperature source Axillary, resp. rate 16, height 5\' 2"  (1.575 m), weight 71.5 kg, SpO2 98 %.    Vent Mode: PSV;CPAP FiO2 (%):  [40 %] 40 % Set Rate:  [10 bmp-15 bmp] 15 bmp Vt Set:  [400 mL] 400 mL PEEP:  [5 cmH20] 5 cmH20 Pressure Support:  [5 cmH20-10 cmH20] 5 cmH20 Plateau Pressure:  [9 cmH20] 9 cmH20   Intake/Output Summary (Last 24 hours) at 07/22/2019 1048 Last data filed at 07/22/2019 0600 Gross per 24 hour  Intake 711.29 ml  Output 2850 ml  Net -2138.71 ml   Filed Weights   07/19/19 2200 07/19/19 2346 07/22/19 0500  Weight: 68.8 kg 68.8 kg 71.5 kg    Examination:   General: Splenic female, intubated on mechanical life support HEENT: Mucous membranes moist, endotracheal tube in place, pupils reactive, tracking appropriately Neuro: Awake alert following commands on mechanical support CV: Irregularly irregular, S1-S2 PULM: Bilateral ventilated breath sounds GI: Soft, nontender nondistended Extremities: Wiggles toes bilaterally, no significant edema Skin: No rash  07/21/2019: Chest x-ray, bilateral lung fields clear.  No infiltrate. The patient's images have been independently reviewed by me.    Resolved Hospital Problem list       Assessment & Plan:    L MCA stroke, M2:  Thrombectomy and s/p TPA.  Complicated by [facility] P:  Continue holding antiplatelets and anticoagulation Serial neuro exams Additional stroke work-up and management per neurology services  PAF/ Aflutter with post conversion pauses P:  We will need oral anticoagulation once able per neurology Appreciate EP input.  Respiratory insufficiency in the post procedure setting  P:  Patient is able to wean at this point from mechanical support. Tolerating SBT/SAT. Plans for liberation from mechanical support today. Close observation in the intensive care unit post extubation for risk of need of reintubation. Hold tube feeds for possible extubation.  Hypotension-resolved Hx HTN P:  Remove arterial line today  AKI  Urinary retention Mild hyperchloremic acidosis  P:  Patient with urinary retention Foley was placed. Continue to follow I's and O's. Possibly related to sedation.  Now that she has been off continuous sedation may be able to trial off Foley.  With bladder scans with I/O cath if needed.  Hypothyroidism  P:  Can restart levothyroxine p.o. once extubated.  Best practice:  Diet: NPO Pain/Anxiety/Delirium protocol (if indicated): RASS goal 0/-1, wean fentanyl gtt to PRN VAP protocol (if indicated): yes DVT prophylaxis: SCDs for now  GI prophylaxis: ppi Glucose control: SSI q4H Mobility: push PT/OT when able  Code Status: Full Family Communication: None updated at bedside.  Disposition: Neuro intensive care unit  Labs   CBC: Recent Labs  Lab 07/19/19 2300 07/19/19 2305 07/20/19 0356 07/20/19 0426 07/20/19 0901 07/21/19 0405 07/22/19 0643  WBC 6.9  --  9.2  --   --  9.4 10.1  NEUTROABS 2.7  --   --   --   --   --   --   HGB 14.8   < > 13.7 14.3 13.3 12.2 11.8*  HCT 45.4   < > 42.3 42.0 39.0 39.0 37.3  MCV 92.5  --  91.6  --   --  94.7 93.7  PLT 256  --  264  --   --  214 147*   < > = values in this interval not  displayed.    Basic Metabolic Panel: Recent Labs  Lab 07/19/19 2300 07/19/19 2300 07/19/19 2305 07/19/19 2305 07/20/19 0356 07/20/19 0356 07/20/19 0426 07/20/19 0901 07/20/19 1921 07/21/19 0405 07/21/19 1655 07/22/19 0643  NA 140   < > 140   < > 137   < > 142 143 142 145  --  142  K 4.3   < > 4.2   < > 4.0   < > 4.0 4.0 4.2 4.3  --  3.6  CL 107   < > 105  --  110  --   --   --  115* 118*  --  112*  CO2 24  --   --   --  20*  --   --   --  17* 19*  --  23  GLUCOSE 105*   < > 101*  --  192*  --   --   --  129* 125*  --  168*  BUN 18   < > 19  --  14  --   --   --  11 12  --  13  CREATININE 0.92   < > 0.90  --  0.82  --   --   --  0.88 1.43*  --  1.01*  CALCIUM 9.2  --   --   --  7.7*  --   --   --  8.3* 8.1*  --  8.2*  MG  --   --   --   --  1.9  --   --   --  3.0*  --  2.5* 2.2  PHOS  --   --   --   --  3.9  --   --   --   --   --  5.1* 3.4   < > = values in this interval not displayed.   GFR: Estimated Creatinine Clearance: 47.3 mL/min (A) (by C-G formula based on SCr of 1.01 mg/dL (H)). Recent Labs  Lab 07/19/19 2300 07/20/19 0356 07/21/19 0405 07/22/19 0643  WBC 6.9 9.2 9.4 10.1    Liver Function Tests: Recent Labs  Lab 07/19/19 2300  AST 22  ALT 15  ALKPHOS 106  BILITOT 0.9  PROT 7.8  ALBUMIN 4.0   No results for input(s): LIPASE, AMYLASE in the last 168 hours. No results for input(s): AMMONIA in the last 168 hours.  ABG    Component Value Date/Time   PHART 7.358 07/20/2019 0901   PCO2ART 38.2 07/20/2019 0901   PO2ART 133.0 (H) 07/20/2019 0901   HCO3 21.5 07/20/2019 0901   TCO2 23 07/20/2019 0901   ACIDBASEDEF 4.0 (H) 07/20/2019 0901   O2SAT 99.0 07/20/2019 0901     Coagulation Profile: Recent  Labs  Lab 07/19/19 2300  INR 1.0    Cardiac Enzymes: No results for input(s): CKTOTAL, CKMB, CKMBINDEX, TROPONINI in the last 168 hours.  HbA1C: Hgb A1c MFr Bld  Date/Time Value Ref Range Status  07/20/2019 03:56 AM 6.1 (H) 4.8 - 5.6 % Final     Comment:    (NOTE) Pre diabetes:          5.7%-6.4% Diabetes:              >6.4% Glycemic control for   <7.0% adults with diabetes   07/20/2019 03:56 AM 6.0 (H) 4.8 - 5.6 % Final    Comment:    (NOTE) Pre diabetes:          5.7%-6.4% Diabetes:              >6.4% Glycemic control for   <7.0% adults with diabetes     CBG: Recent Labs  Lab 07/21/19 1626 07/21/19 1939 07/21/19 2338 07/22/19 0326 07/22/19 0812  GLUCAP 126* 130* 154* 130* 130*    This patient is critically ill with multiple organ system failure; which, requires frequent high complexity decision making, assessment, support, evaluation, and titration of therapies. This was completed through the application of advanced monitoring technologies and extensive interpretation of multiple databases. During this encounter critical care time was devoted to patient care services described in this note for 31 minutes.   Garner Nash, DO [facility] 07/22/2019 10:49 AM

## 2019-07-22 NOTE — Evaluation (Signed)
Physical Therapy Evaluation Patient Details Name: Jamie Mitchell MRN: [id number] DOB: 1945/06/29 Today's Date: 07/22/2019   History of Present Illness  74 yo F who presented with right sided weakness found to have acute left M2/ MCA occlusion s/p tPA and emergent thrombectomy which was complicated by moderate left sylvian SAH.  On 2/25 patient with Afib/ Aflutter with RVR and post conversion pauses. VDRF and was extubated am of 2/27.    Clinical Impression  Pt admitted with above diagnosis. Pt was able to ambulate with RW in room with min to mod assist and cues for safety as she needed assist to keep right hand on RW due to inattention/neglect.  Needed cues to sequence steps and rw as well.  Pt appears to have expressive and receptive aphasia as well.  Also with swallowing issues having to suction pt multiple times during session. Feel that pt is an excellent rehab candidate.  Will follow acutely.  Pt currently with functional limitations due to the deficits listed below (see PT Problem List). Pt will benefit from skilled PT to increase their independence and safety with mobility to allow discharge to the venue listed below.      Follow Up Recommendations CIR;Supervision/Assistance - 24 hour    Equipment Recommendations  Other (comment)(TBA)    Recommendations for Other Services Rehab consult     Precautions / Restrictions Precautions Precautions: Fall Restrictions Weight Bearing Restrictions: No      Mobility  Bed Mobility Overal bed mobility: Needs Assistance Bed Mobility: Supine to Sit     Supine to sit: Min assist     General bed mobility comments: cues for technique. Pt needed cues and assist to be aware of right UE.  Question if pt has inattention vs neglect of right hemibody.   Transfers Overall transfer level: Needs assistance Equipment used: Rolling walker (2 wheeled) Transfers: Sit to/from Stand Sit to Stand: Min assist;Mod assist         General  transfer comment: Cues for hand placement and assist to power up. Needed assist to place right hand on RW and to keep it in place.   Ambulation/Gait Ambulation/Gait assistance: Min assist;+2 safety/equipment;Mod assist Gait Distance (Feet): 45 Feet Assistive device: Rolling walker (2 wheeled) Gait Pattern/deviations: Step-through pattern;Decreased stride length;Drifts right/left;Ataxic   Gait velocity interpretation: <1.31 ft/sec, indicative of household ambulator General Gait Details: Pt was able to walk to door and back needing assit to keep right hand on RW, to steer RW and for step sequencing.  Overall fairly steady with some incoordination of right LE as well.    Stairs            Wheelchair Mobility    Modified Rankin (Stroke Patients Only) Modified Rankin (Stroke Patients Only) Pre-Morbid Rankin Score: No symptoms Modified Rankin: Moderately severe disability     Balance Overall balance assessment: Needs assistance Sitting-balance support: No upper extremity supported;Feet supported Sitting balance-Leahy Scale: Fair     Standing balance support: Bilateral upper extremity supported;During functional activity Standing balance-Leahy Scale: Poor Standing balance comment: relies on at least one UE support for balance as well as external support.                               Pertinent Vitals/Pain Pain Assessment: Faces Faces Pain Scale: Hurts whole lot Pain Location: headache Pain Descriptors / Indicators: Aching;Grimacing;Guarding Pain Intervention(s): Monitored during session;Limited activity within patient's tolerance;Repositioned    Home Living Family/patient expects  to be discharged to:: Private residence Living Arrangements: Alone(in [place], was here visiting for 6 months to a year with son) Available Help at Discharge: Family;Available 24 hours/day(daughter in law and husband) Type of Home: Mobile home Home Access: Stairs to enter Entrance  Stairs-Rails: Right;Left;Can reach both Entrance Stairs-Number of Steps: 7 Home Layout: One level Home Equipment: None      Prior Function Level of Independence: Independent         Comments: 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, 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     Engineer, manufacturing [employer]   Dominant Hand: Right    Extremity/Trunk Assessment   Upper Extremity Assessment Upper Extremity Assessment: Defer to OT evaluation    Lower Extremity Assessment Lower Extremity Assessment: Generalized weakness    Cervical / Trunk Assessment Cervical / Trunk Assessment: Normal  Communication   Communication: Expressive difficulties;Receptive difficulties;Prefers language other than [place];Other (comment)(son translated session)  Cognition Arousal/Alertness: Awake/alert Behavior During Therapy: Flat affect Overall Cognitive Status: Impaired/Different from baseline Area of Impairment: Following commands;Safety/judgement;Problem solving                       Following Commands: Follows one step commands inconsistently;Follows one step commands with increased time Safety/Judgement: Decreased awareness of safety;Decreased awareness of deficits   Problem Solving: Slow processing;Decreased initiation;Difficulty sequencing;Requires verbal cues;Requires tactile cues General Comments: Pt with expressive aphasia and questionable receptive aphasia as well therefore difficult to determine cognition due to this and language barrier.       General Comments General comments (skin integrity, edema, etc.): VSS with BP soft but pt asymptomatic. HR stable during session. Pt on 4LO2    Exercises General Exercises - Lower Extremity Ankle Circles/Pumps: AROM;Both;10 reps;Supine Long Arc Quad: AROM;Both;10 reps;Seated   Assessment/Plan    PT Assessment Patient needs continued PT services  PT Problem List Decreased activity tolerance;Decreased balance;Decreased mobility;Decreased strength;Decreased coordination;Decreased  knowledge of use of DME;Decreased cognition;Decreased safety awareness;Decreased knowledge of precautions       PT Treatment Interventions DME instruction;Stair training;Gait training;Functional mobility training;Therapeutic activities;Therapeutic exercise;Balance training;Cognitive remediation;Patient/family education;Neuromuscular re-education    PT Goals (Current goals can be found in the Care Plan section)  Acute Rehab PT Goals Patient Stated Goal: to gohome PT Goal Formulation: With patient Time For Goal Achievement: 08/05/19 Potential to Achieve Goals: Good    Frequency Min 4X/week   Barriers to discharge        Co-evaluation               AM-PAC PT "6 Clicks" Mobility  Outcome Measure Help needed turning from your back to your side while in a flat bed without using bedrails?: A Little Help needed moving from lying on your back to sitting on the side of a flat bed without using bedrails?: A Little Help needed moving to and from a bed to a chair (including a wheelchair)?: A Little Help needed standing up from a chair using your arms (e.g., wheelchair or bedside chair)?: A Lot Help needed to walk in hospital room?: A Lot Help needed climbing 3-5 steps with a railing? : A Lot 6 Click Score: 15    End of Session Equipment Utilized During Treatment: Gait belt;Oxygen(on 4LO2) Activity Tolerance: Patient tolerated treatment well Patient left: in chair;with call bell/phone within reach;with chair alarm set;with family/visitor present Nurse Communication: Mobility status PT Visit Diagnosis: Unsteadiness on feet (R26.81);Muscle weakness (generalized) (M62.81)    Time: [id number] PT Time Calculation (min) (ACUTE ONLY): 38 min   Charges:   PT Evaluation $  PT Eval Moderate Complexity: 1 Mod PT Treatments $Gait Training: 8-22 mins $Therapeutic Exercise: 8-22 mins        Gena Laski W,PT Acute Rehabilitation Services Pager:  [phone]  Office:  [place] 07/22/2019, 4:01 PM

## 2019-07-22 NOTE — Progress Notes (Signed)
Pt taken to MRI. Monitored throughout. Pt tolerated well.

## 2019-07-22 NOTE — Progress Notes (Signed)
Progress Note  Patient Name: Jamie Mitchell Date of Encounter: 07/22/2019  Primary Cardiologist: Donato Heinz, MD   Subjective   Remains intubated  Inpatient Medications    Scheduled Meds: . amiodarone  200 mg Per Tube BID  . atorvastatin  40 mg Per Tube Daily  . atropine  1 mg Intravenous Once  . chlorhexidine gluconate (MEDLINE KIT)  15 mL Mouth Rinse BID  . Chlorhexidine Gluconate Cloth  6 each Topical Daily  . feeding supplement (PRO-STAT SUGAR FREE 64)  30 mL Per Tube BID  . insulin aspart  0-9 Units Subcutaneous Q4H  . levothyroxine  37.5 mcg Intravenous Daily  . mouth rinse  15 mL Mouth Rinse 10 times per day  . pantoprazole (PROTONIX) IV  40 mg Intravenous Q24H   Continuous Infusions: . sodium chloride    . sodium chloride    . feeding supplement (OSMOLITE 1.2 CAL) 40 mL/hr at 07/21/19 1800  . fentaNYL infusion INTRAVENOUS Stopped (07/21/19 0843)  . phenylephrine (NEO-SYNEPHRINE) Adult infusion Stopped (07/21/19 0950)   PRN Meds: acetaminophen **OR** acetaminophen (TYLENOL) oral liquid 160 mg/5 mL **OR** acetaminophen, fentaNYL, midazolam, ondansetron (ZOFRAN) IV, senna-docusate   Vital Signs    Vitals:   07/22/19 0600 07/22/19 0700 07/22/19 0800 07/22/19 0830  BP: 107/77 121/60 113/71 113/71  Pulse: 63 86  90  Resp: 14 15  16   Temp:   99.1 F (37.3 C)   TempSrc:   Axillary   SpO2: 99% 98%  98%  Weight:      Height:        Intake/Output Summary (Last 24 hours) at 07/22/2019 1049 Last data filed at 07/22/2019 0600 Gross per 24 hour  Intake 711.29 ml  Output 2850 ml  Net -2138.71 ml   Filed Weights   07/19/19 2200 07/19/19 2346 07/22/19 0500  Weight: 68.8 kg 68.8 kg 71.5 kg    Telemetry    Atrial fib with a RVR/CVR, post termination pauses, NSR/sinus bradycardia - Personally Reviewed  ECG    none - Personally Reviewed  Physical Exam    GEN: No acute distress. Intubated and awake   Neck: No JVD Cardiac: IRRR, no  murmurs, rubs, or gallops.  Respiratory: Clear to auscultation bilaterally. GI: Soft, nontender, non-distended  MS: No edema; No deformity. Neuro:  awake, appears to know son Psych: Normal affect   Labs    Chemistry Recent Labs  Lab 07/19/19 2300 07/19/19 2305 07/20/19 1921 07/21/19 0405 07/22/19 0643  NA 140   < > 142 145 142  K 4.3   < > 4.2 4.3 3.6  CL 107   < > 115* 118* 112*  CO2 24   < > 17* 19* 23  GLUCOSE 105*   < > 129* 125* 168*  BUN 18   < > 11 12 13   CREATININE 0.92   < > 0.88 1.43* 1.01*  CALCIUM 9.2   < > 8.3* 8.1* 8.2*  PROT 7.8  --   --   --   --   ALBUMIN 4.0  --   --   --   --   AST 22  --   --   --   --   ALT 15  --   --   --   --   ALKPHOS 106  --   --   --   --   BILITOT 0.9  --   --   --   --   GFRNONAA >60   < > >  60 37* 56*  GFRAA >60   < > >60 43* >60  ANIONGAP 9   < > 10 8 7    < > = values in this interval not displayed.     Hematology Recent Labs  Lab 07/20/19 0356 07/20/19 0426 07/20/19 0901 07/21/19 0405 07/22/19 0643  WBC 9.2  --   --  9.4 10.1  RBC 4.62  --   --  4.12 3.98  HGB 13.7   < > 13.3 12.2 11.8*  HCT 42.3   < > 39.0 39.0 37.3  MCV 91.6  --   --  94.7 93.7  MCH 29.7  --   --  29.6 29.6  MCHC 32.4  --   --  31.3 31.6  RDW 13.8  --   --  14.6 14.4  PLT 264  --   --  214 147*   < > = values in this interval not displayed.    Cardiac EnzymesNo results for input(s): TROPONINI in the last 168 hours. No results for input(s): TROPIPOC in the last 168 hours.   BNPNo results for input(s): BNP, PROBNP in the last 168 hours.   DDimer No results for input(s): DDIMER in the last 168 hours.   Radiology    CT HEAD WO CONTRAST  Result Date: 07/20/2019 CLINICAL DATA:  Follow-up stroke EXAM: CT HEAD WITHOUT CONTRAST TECHNIQUE: Contiguous axial images were obtained from the base of the skull through the vertex without intravenous contrast. COMPARISON:  Head CT earlier same day. FINDINGS: Brain: Acute infarction of the left frontal  operculum, anterior insular and subinsular brain as shown previously. Low-density and swelling but no evidence of further extension of the infarction. Small amount of subarachnoid hemorrhage at the base of the brain and within the sylvian fissure on the left is becoming slightly less dense. No evidence of additional or worsening hemorrhage. No significant mass effect. One or 2 mm of left-to-right midline shift at the most. No other area of acute or subacute infarction. 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 No hydrocephalus. No extra-axial collection. No intraparenchymal hemorrhage in the region of infarction. Vascular: No new primary vascular finding. Skull: Negative Sinuses/Orbits: Clear/normal Other: None IMPRESSION: Subarachnoid hemorrhage at the base of the brain and sylvian fissure on the left is becoming less dense. No evidence of additional bleeding. Well-circumscribed infarction in the left frontal operculum, insula and subinsular brain as seen previously. No evidence of infarct extension. Mild regional swelling with left-to-right shift of 1-2 mm. No infarct hematoma. Electronically Signed   By: Nelson Chimes M.D.   On: 07/20/2019 21:53   DG Chest Port 1 View  Result Date: 07/21/2019 CLINICAL DATA:  Respiratory failure. Stroke. EXAM: PORTABLE CHEST 1 VIEW COMPARISON:  One-view chest x-ray 07/19/2018 FINDINGS: Endotracheal tube is been pulled back, now terminating at the clavicles, well above the carina. Lung volumes remain low. Pulmonary vascular congestion is unchanged. IMPRESSION: 1. Repositioning of endotracheal tube now in satisfactory position. 2. Stable low lung volumes and moderate pulmonary vascular congestion. Electronically Signed   By: San Morelle M.D.   On: 07/21/2019 08:36   ECHOCARDIOGRAM COMPLETE  Result Date: 07/20/2019    ECHOCARDIOGRAM REPORT   Patient Name:   Jamie Mitchell Date of Exam: 07/20/2019  Medical Rec #:  [id number]               Height:       62.0 in Accession #:    [id number]  Weight:       151.7 lb Date of Birth:  July 01, 1945              BSA:          1.700 m Patient Age:    74 years                BP:           124/68 mmHg Patient Gender: F                       HR:           79 bpm. Exam Location:  Inpatient Procedure: 2D Echo, Cardiac Doppler and Color Doppler STAT ECHO Indications:    CVA  History:        Patient has no prior history of Echocardiogram examinations. PAD                 and Stroke, Arrythmias:Atrial Fibrillation and Tachy-Brady; Risk                 Factors:Hypertension and Dyslipidemia.  Sonographer:    Dustin Flock Referring Phys: [place]  1. Left ventricular ejection fraction, by estimation, is 60 to 65%. The left ventricle has normal function. The left ventricle has no regional wall motion abnormalities. Left ventricular diastolic parameters were normal.  2. Right ventricular systolic function is normal. The right ventricular size is normal. Tricuspid regurgitation signal is inadequate for assessing PA pressure.  3. The mitral valve is normal in structure and function. Mild mitral valve regurgitation. No evidence of mitral stenosis.  4. The aortic valve is normal in structure and function. Aortic valve regurgitation is not visualized. No aortic stenosis is present.  5. The inferior vena cava is normal in size with greater than 50% respiratory variability, suggesting right atrial pressure of 3 mmHg. FINDINGS  Left Ventricle: Left ventricular ejection fraction, by estimation, is 60 to 65%. The left ventricle has normal function. The left ventricle has no regional wall motion abnormalities. The left ventricular internal cavity size was normal in size. There is  no left ventricular hypertrophy. Left ventricular diastolic parameters were normal. Normal left ventricular filling pressure. Right Ventricle: The right ventricular size is normal.  No increase in right ventricular wall thickness. Right ventricular systolic function is normal. Tricuspid regurgitation signal is inadequate for assessing PA pressure. Left Atrium: Left atrial size was normal in size. Right Atrium: Right atrial size was normal in size. Pericardium: There is no evidence of pericardial effusion. Mitral Valve: The mitral valve is normal in structure and function. Normal mobility of the mitral valve leaflets. Mild mitral valve regurgitation. No evidence of mitral valve stenosis. Tricuspid Valve: The tricuspid valve is normal in structure. Tricuspid valve regurgitation is trivial. No evidence of tricuspid stenosis. Aortic Valve: The aortic valve is normal in structure and function. Aortic valve regurgitation is not visualized. No aortic stenosis is present. Pulmonic Valve: The pulmonic valve was normal in structure. Pulmonic valve regurgitation is not visualized. No evidence of pulmonic stenosis. Aorta: The aortic root is normal in size and structure. Venous: IVC assessment for right atrial pressure unable to be performed due to mechanical ventilation. The inferior vena cava is normal in size with greater than 50% respiratory variability, suggesting right atrial pressure of 3 mmHg. IAS/Shunts: No atrial level shunt detected by color flow Doppler.  LEFT VENTRICLE PLAX 2D LVIDd:         4.50 cm  Diastology LVIDs:         2.80 cm  LV e' lateral:   11.30 cm/s LV PW:         1.10 cm  LV E/e' lateral: 9.5 LV IVS:        1.20 cm  LV e' medial:    9.68 cm/s LVOT diam:     2.00 cm  LV E/e' medial:  11.1 LV SV:         61 LV SV Index:   36 LVOT Area:     3.14 cm  RIGHT VENTRICLE RV Basal diam:  2.90 cm RV S prime:     13.80 cm/s TAPSE (M-mode): 3.3 cm LEFT ATRIUM             Index       RIGHT ATRIUM           Index LA diam:        4.50 cm 2.65 cm/m  RA Area:     11.90 cm LA Vol (A2C):   45.3 ml 26.65 ml/m RA Volume:   26.60 ml  15.65 ml/m LA Vol (A4C):   40.5 ml 23.83 ml/m LA Biplane Vol:  45.7 ml 26.89 ml/m  AORTIC VALVE LVOT Vmax:   84.90 cm/s LVOT Vmean:  52.600 cm/s LVOT VTI:    0.195 m  AORTA Ao Root diam: 2.60 cm MITRAL VALVE MV Area (PHT): 3.37 cm     SHUNTS MV Decel Time: 225 msec     Systemic VTI:  0.20 m MV E velocity: 107.00 cm/s  Systemic Diam: 2.00 cm MV A velocity: 66.80 cm/s MV E/A ratio:  1.60 Fransico Him MD Electronically signed by Fransico Him MD Signature Date/Time: 07/20/2019/12:37:18 PM    Final     Cardiac Studies   none  Patient Profile     74 y.o. female admitted with stroke due to atrial fib.  Assessment & Plan    1. Atrial fib/flutter - her rates are better controlled with amiodarone. She will continue 200 bid down tube. Transition to oral when able to swallow 2. Sinus node dysfunction - she has post termination pauses in atrial fib. These appear to be improved as her rates in atrial fib are not as fast. Discussed with her son. Hopefully the pauses will improve with time and once extubated.  3. Coags - we will defer decision to start systemic anti-coagulation to the stroke service.     For questions or updates, please contact [facility] Please consult [email or web address] for contact info under Cardiology/STEMI.      Signed, Cristopher Peru, MD  07/22/2019, 10:49 AM  Patient ID: Rhina Brackett, female   DOB: May 09, 1946, 73 y.o.   MRN: [id number]

## 2019-07-22 NOTE — Procedures (Signed)
Extubation Procedure Note  Patient Details:   Name: Jamie Mitchell DOB: [birth year] MRN: [id number]   Airway Documentation:    Vent end date: 07/22/19 Vent end time: 1150   Evaluation  O2 sats: stable throughout Complications: No apparent complications Patient did tolerate procedure well. Bilateral Breath Sounds: Clear, Diminished   Pt extubated per MD order Pt had + cuff leak  Placed on 4L [place] tolerating well, no distress noted at this time  Pt non verbal at this time   Ciro Backer 07/22/2019, 11:57 AM

## 2019-07-22 NOTE — Progress Notes (Signed)
STROKE TEAM PROGRESS NOTE   INTERVAL HISTORY Nurses at bedside. Neuro stable. Still intubated but weaning, bresthing over the vent. No sedation, awake, alert. Can follow commands on the right side, will follow commands with visual cues and verbally (interpreter not at bedside and limited spanish), can squeeze with both hands. Will not not yes or no to questions.   Vitals:   07/22/19 0830 07/22/19 0900 07/22/19 1000 07/22/19 1100  BP: 113/71 111/68 111/79 118/76  Pulse: 90     Resp: 16     Temp:      TempSrc:      SpO2: 98%     Weight:      Height:       CBC:  Recent Labs  Lab 07/19/19 2300 07/19/19 2305 07/21/19 0405 07/22/19 0643  WBC 6.9   < > 9.4 10.1  NEUTROABS 2.7  --   --   --   HGB 14.8   < > 12.2 11.8*  HCT 45.4   < > 39.0 37.3  MCV 92.5   < > 94.7 93.7  PLT 256   < > 214 147*   < > = values in this interval not displayed.   Basic Metabolic Panel:  Recent Labs  Lab 07/20/19 1921 07/21/19 0405 07/21/19 1655 07/22/19 0643  NA  --  145  --  142  K  --  4.3  --  3.6  CL  --  118*  --  112*  CO2  --  19*  --  23  GLUCOSE  --  125*  --  168*  BUN  --  12  --  13  CREATININE  --  1.43*  --  1.01*  CALCIUM  --  8.1*  --  8.2*  MG   < >  --  2.5* 2.2  PHOS  --   --  5.1* 3.4   < > = values in this interval not displayed.   Lipid Panel:     Component Value Date/Time   CHOL 113 07/20/2019 0356   CHOL 137 02/26/2017 1035   TRIG 57 07/20/2019 0356   HDL 36 (L) 07/20/2019 0356   HDL 35 (L) 02/26/2017 1035   CHOLHDL 3.1 07/20/2019 0356   VLDL 11 07/20/2019 0356   LDLCALC 66 07/20/2019 0356   LDLCALC 49 02/26/2017 1035   HgbA1c:  Lab Results  Component Value Date   HGBA1C 6.1 (H) 07/20/2019   HGBA1C 6.0 (H) 07/20/2019   Urine Drug Screen:     Component Value Date/Time   LABOPIA NONE DETECTED 07/21/2019 0058   COCAINSCRNUR NONE DETECTED 07/21/2019 0058   LABBENZ POSITIVE (A) 07/21/2019 0058   AMPHETMU NONE DETECTED 07/21/2019 0058   THCU NONE  DETECTED 07/21/2019 0058   LABBARB NONE DETECTED 07/21/2019 0058    Alcohol Level     Component Value Date/Time   ETH <10 07/19/2019 2300    IMAGING past 48 hours CT HEAD WO CONTRAST  Result Date: 07/20/2019 CLINICAL DATA:  Follow-up stroke EXAM: CT HEAD WITHOUT CONTRAST TECHNIQUE: Contiguous axial images were obtained from the base of the skull through the vertex without intravenous contrast. COMPARISON:  Head CT earlier same day. FINDINGS: Brain: Acute infarction of the left frontal operculum, anterior insular and subinsular brain as shown previously. Low-density and swelling but no evidence of further extension of the infarction. Small amount of subarachnoid hemorrhage at the base of the brain and within the sylvian fissure on the left is becoming slightly less dense.  No evidence of additional or worsening hemorrhage. No significant mass effect. One or 2 mm of left-to-right midline shift at the most. No other area of acute or subacute infarction. 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 No hydrocephalus. No extra-axial collection. No intraparenchymal hemorrhage in the region of infarction. Vascular: No new primary vascular finding. Skull: Negative Sinuses/Orbits: Clear/normal Other: None IMPRESSION: Subarachnoid hemorrhage at the base of the brain and sylvian fissure on the left is becoming less dense. No evidence of additional bleeding. Well-circumscribed infarction in the left frontal operculum, insula and subinsular brain as seen previously. No evidence of infarct extension. Mild regional swelling with left-to-right shift of 1-2 mm. No infarct hematoma. Electronically Signed   By: Nelson Chimes M.D.   On: 07/20/2019 21:53   DG Chest Port 1 View  Result Date: 07/21/2019 CLINICAL DATA:  Respiratory failure. Stroke. EXAM: PORTABLE CHEST 1 VIEW COMPARISON:  One-view chest x-ray 07/19/2018 FINDINGS: Endotracheal tube is been pulled back, now  terminating at the clavicles, well above the carina. Lung volumes remain low. Pulmonary vascular congestion is unchanged. IMPRESSION: 1. Repositioning of endotracheal tube now in satisfactory position. 2. Stable low lung volumes and moderate pulmonary vascular congestion. Electronically Signed   By: San Morelle M.D.   On: 07/21/2019 08:36   ECHOCARDIOGRAM COMPLETE  Result Date: 07/20/2019    ECHOCARDIOGRAM REPORT   Patient Name:   Jamie Mitchell Date of Exam: 07/20/2019 Medical Rec #:  [id number]               Height:       62.0 in Accession #:    [id number]              Weight:       151.7 lb Date of Birth:  31-Dec-1945              BSA:          1.700 m Patient Age:    27 years                BP:           124/68 mmHg Patient Gender: F                       HR:           79 bpm. Exam Location:  Inpatient Procedure: 2D Echo, Cardiac Doppler and Color Doppler STAT ECHO Indications:    CVA  History:        Patient has no prior history of Echocardiogram examinations. PAD                 and Stroke, Arrythmias:Atrial Fibrillation and Tachy-Brady; Risk                 Factors:Hypertension and Dyslipidemia.  Sonographer:    Dustin Flock Referring Phys: Egan  1. Left ventricular ejection fraction, by estimation, is 60 to 65%. The left ventricle has normal function. The left ventricle has no regional wall motion abnormalities. Left ventricular diastolic parameters were normal.  2. Right ventricular systolic function is normal. The right ventricular size is normal. Tricuspid regurgitation signal is inadequate for assessing PA pressure.  3. The mitral valve is normal in structure and function. Mild mitral valve regurgitation. No evidence of mitral stenosis.  4. The aortic valve is normal in structure and function. Aortic valve regurgitation is not visualized. No aortic stenosis is present.  5.  The inferior vena cava is normal in size with greater than 50% respiratory  variability, suggesting right atrial pressure of 3 mmHg. FINDINGS  Left Ventricle: Left ventricular ejection fraction, by estimation, is 60 to 65%. The left ventricle has normal function. The left ventricle has no regional wall motion abnormalities. The left ventricular internal cavity size was normal in size. There is  no left ventricular hypertrophy. Left ventricular diastolic parameters were normal. Normal left ventricular filling pressure. Right Ventricle: The right ventricular size is normal. No increase in right ventricular wall thickness. Right ventricular systolic function is normal. Tricuspid regurgitation signal is inadequate for assessing PA pressure. Left Atrium: Left atrial size was normal in size. Right Atrium: Right atrial size was normal in size. Pericardium: There is no evidence of pericardial effusion. Mitral Valve: The mitral valve is normal in structure and function. Normal mobility of the mitral valve leaflets. Mild mitral valve regurgitation. No evidence of mitral valve stenosis. Tricuspid Valve: The tricuspid valve is normal in structure. Tricuspid valve regurgitation is trivial. No evidence of tricuspid stenosis. Aortic Valve: The aortic valve is normal in structure and function. Aortic valve regurgitation is not visualized. No aortic stenosis is present. Pulmonic Valve: The pulmonic valve was normal in structure. Pulmonic valve regurgitation is not visualized. No evidence of pulmonic stenosis. Aorta: The aortic root is normal in size and structure. Venous: IVC assessment for right atrial pressure unable to be performed due to mechanical ventilation. The inferior vena cava is normal in size with greater than 50% respiratory variability, suggesting right atrial pressure of 3 mmHg. IAS/Shunts: No atrial level shunt detected by color flow Doppler.  LEFT VENTRICLE PLAX 2D LVIDd:         4.50 cm  Diastology LVIDs:         2.80 cm  LV e' lateral:   11.30 cm/s LV PW:         1.10 cm  LV E/e'  lateral: 9.5 LV IVS:        1.20 cm  LV e' medial:    9.68 cm/s LVOT diam:     2.00 cm  LV E/e' medial:  11.1 LV SV:         61 LV SV Index:   36 LVOT Area:     3.14 cm  RIGHT VENTRICLE RV Basal diam:  2.90 cm RV S prime:     13.80 cm/s TAPSE (M-mode): 3.3 cm LEFT ATRIUM             Index       RIGHT ATRIUM           Index LA diam:        4.50 cm 2.65 cm/m  RA Area:     11.90 cm LA Vol (A2C):   45.3 ml 26.65 ml/m RA Volume:   26.60 ml  15.65 ml/m LA Vol (A4C):   40.5 ml 23.83 ml/m LA Biplane Vol: 45.7 ml 26.89 ml/m  AORTIC VALVE LVOT Vmax:   84.90 cm/s LVOT Vmean:  52.600 cm/s LVOT VTI:    0.195 m  AORTA Ao Root diam: 2.60 cm MITRAL VALVE MV Area (PHT): 3.37 cm     SHUNTS MV Decel Time: 225 msec     Systemic VTI:  0.20 m MV E velocity: 107.00 cm/s  Systemic Diam: 2.00 cm MV A velocity: 66.80 cm/s MV E/A ratio:  1.60 Fransico Him MD Electronically signed by Fransico Him MD Signature Date/Time: 07/20/2019/12:37:18 PM    Final  PHYSICAL EXAM   Temp:  [98.7 F (37.1 C)-99.2 F (37.3 C)] 99.1 F (37.3 C) (02/27 0800) Pulse Rate:  [46-110] 90 (02/27 0830) Resp:  [10-18] 16 (02/27 0830) BP: (89-122)/(60-81) 118/76 (02/27 1100) SpO2:  [96 %-99 %] 98 % (02/27 0830) Arterial Line BP: (78-136)/(59-86) 129/69 (02/27 0700) FiO2 (%):  [40 %] 40 % (02/27 0830) Weight:  [71.5 kg] 71.5 kg (02/27 0500)  General - Well nourished, well developed, intubated off sedation.  Ophthalmologic - fundi not visualized due to noncooperation.  Cardiovascular - irregularly irregular heart rate and rhythm wheeze intermittent RVR.  Neuro - intubated off sedation, eyes open, follows command bilaterally in the uppers and lowers (show 2 fingers, wiggle toes) with visual cues and some simple voice commands, does not nod no/yes to questions, non verbal, did not follow any other commands.  Left gaze preference, but able to cross midline with intermittent right gaze. Blinking to visual threat on the left but not on the  right, PERRL. Corneal reflex present, gag and cough present. Breathing over the vent.  Facial symmetry not able to test due to ET tube.  Tongue protrusion not cooperative. On pain stimulation, RUE 3+/5 with drift and RLE 3+/5. LUE and LLE spontaneous movement against gravity. DTR 1+ and toes equiv. Sensation, coordination and gait not tested.   ASSESSMENT/PLAN Jamie Mitchell is a 74 y.o. female with history of HTN, HLD and thyroid disease presenting with R hemiparesis, Left gaze preference, R facial weakness, R field cut and global aphasia. AF noted by EMS. Received tPA 07/19/2019 at 2317. Found to have  L M2 occlusion and taken to IR.   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 Old high L frontal infarct. ASPECTS 9.    CTA head & neck L M2 LVO. Diffuse tortuosity head and neck. Fetal PCAs w/ small VB system.   Cerebral angio TICI2c revascularization LM2 superior division branch occlusion. Moderate L sylvian SAH   CT head post IR anterolateral L frontal lobe infarct in operculum, insula and subinsular region. Small to moderate L sylvian fissures into basal cisterns SAH.   Repeat CT head L sylvian fissure SAH less dense. L frontal operculum, insula and subinsular infarct well seen. Mild 1-35mm midline shift.  MRI still pending due to unsafe with cardiac pause  MRA  still pending due to unsafe with cardiac pause  2D Echo EF 60-65%. No source of embolus   LDL 66  HgbA1c 6.0  UDS +benzos  SCDs for VTE prophylaxis  aspirin 81 mg daily prior to admission, now on No antithrombotic d/t focal SAH.    Therapy recommendations:  pending   Disposition:  pending   Paroxysmal Atrial Fibrillation/Atrial Flutter w/ post conversion pauses  afib RVR with pauses  overnight  TSH 6.781->7.052, free T4 normal 0.80   CHA2DS2-VASc Score = at least 5, ?2 oral anticoagulation recommended  Age in Years:  69-74   +1    Sex:  Female   Female   +1    Hypertension History:  yes   +1     Diabetes Mellitus:  0  Congestive Heart Failure History:  0  Vascular Disease History:  0     Stroke/TIA/Thromboembolism History:  yes   +2 . Cardiology on board, appreciate  help . Cardiology is debating on amiodarone drip  . Will consider anticoagulation once [facility] resolves   Acute Respiratory Failure  Intubated for IR, left intubated post IR for airway protection  Off Sedation   Did not tolerate spontaneous breathing trials today  Not able to extubate this am  CCM onboard, will continue to try weaning this pm  Hypotension Hx of hypertension  Home meds:  HCTZ 25  Current SBP goal to 110-160 given SAH - currently within parameters . Off Neo drip now . Long-term BP goal normotensive  Hyperlipidemia  Home meds:  lipitor 40, resumed in hospital  LDL 66, goal < 70  Continue statin at discharge  Acute renal failure  Urinary retention,  > 1L with in and out   On Foley catheter now   AKI likely postobstructive.   Cre 0.88->1.43->1.01  Close BMP monitoring  CCM on board  Prediabetes  HgbA1c 6.0, goal < 7.0  CBGs, SSI  PCP follow up  Other Stroke Risk Factors  Advanced age  Overweight, Body mass index is 28.83 kg/m., recommend weight loss, diet and exercise as appropriate   Other Active Problems  Hypothyroid on synthroid - TSH 6.781-> 7.052, free T4 normal 0.80  Vomited coffee ground emesis. PPI bid added. Hgb 13.3->12.2->11.8  Hospital day # 2 This patient is critically ill and at significant risk of neurological worsening, death and care requires constant monitoring of vital signs, hemodynamics,respiratory and cardiac monitoring,review of multiple databases, neurological assessment, discussion with family, other specialists and medical  decision making of high complexity.I  I spent 30 minutes of neurocritical care time in the care of this patient.  Sarina Ill, MD [facility]    To contact Stroke Continuity provider, please refer to [email or web address] After hours, contact General Neurology

## 2019-07-23 LAB — CBC
HCT: 41.4 % (ref 36.0–46.0)
Hemoglobin: 13.3 g/dL (ref 12.0–15.0)
MCH: 29.2 pg (ref 26.0–34.0)
MCHC: 32.1 g/dL (ref 30.0–36.0)
MCV: 91 fL (ref 80.0–100.0)
Platelets: 194 10*3/uL (ref 150–400)
RBC: 4.55 MIL/uL (ref 3.87–5.11)
RDW: 13.7 % (ref 11.5–15.5)
WBC: 8 10*3/uL (ref 4.0–10.5)
nRBC: 0 % (ref 0.0–0.2)

## 2019-07-23 LAB — GLUCOSE, CAPILLARY
Glucose-Capillary: 123 mg/dL — ABNORMAL HIGH (ref 70–99)
Glucose-Capillary: 131 mg/dL — ABNORMAL HIGH (ref 70–99)
Glucose-Capillary: 133 mg/dL — ABNORMAL HIGH (ref 70–99)
Glucose-Capillary: 135 mg/dL — ABNORMAL HIGH (ref 70–99)
Glucose-Capillary: 136 mg/dL — ABNORMAL HIGH (ref 70–99)
Glucose-Capillary: 153 mg/dL — ABNORMAL HIGH (ref 70–99)

## 2019-07-23 LAB — BASIC METABOLIC PANEL
Anion gap: 10 (ref 5–15)
BUN: 15 mg/dL (ref 8–23)
CO2: 27 mmol/L (ref 22–32)
Calcium: 8.5 mg/dL — ABNORMAL LOW (ref 8.9–10.3)
Chloride: 103 mmol/L (ref 98–111)
Creatinine, Ser: 0.75 mg/dL (ref 0.44–1.00)
GFR calc Af Amer: >60 mL/min (ref >60)
GFR calc non Af Amer: >60 mL/min (ref >60)
Glucose, Bld: 133 mg/dL — ABNORMAL HIGH (ref 70–99)
Potassium: 3.8 mmol/L (ref 3.5–5.1)
Sodium: 140 mmol/L (ref 135–145)

## 2019-07-23 LAB — PHOSPHORUS: Phosphorus: 3.6 mg/dL (ref 2.5–4.6)

## 2019-07-23 LAB — MAGNESIUM: Magnesium: 2 mg/dL (ref 1.7–2.4)

## 2019-07-23 NOTE — Progress Notes (Signed)
PCCM: We will sign off. Patient doing well s/p extubation. Please call with any questions.  [facility] 07/23/2019 12:17 PM

## 2019-07-23 NOTE — Progress Notes (Signed)
Progress Note  Patient Name: Jamie Mitchell Date of Encounter: 07/23/2019  Primary Cardiologist: Donato Heinz, MD   Subjective   Remains intubated. Awake but does not answer questions.   Inpatient Medications    Scheduled Meds: . amiodarone  200 mg Per Tube BID  . atorvastatin  40 mg Per Tube Daily  . atropine  1 mg Intravenous Once  . chlorhexidine  15 mL Mouth Rinse BID  . Chlorhexidine Gluconate Cloth  6 each Topical Daily  . feeding supplement (PRO-STAT SUGAR FREE 64)  30 mL Per Tube BID  . insulin aspart  0-9 Units Subcutaneous Q4H  . levothyroxine  37.5 mcg Intravenous Daily  . mouth rinse  15 mL Mouth Rinse q12n4p  . pantoprazole (PROTONIX) IV  40 mg Intravenous Q24H   Continuous Infusions: . sodium chloride    . sodium chloride    . feeding supplement (OSMOLITE 1.2 CAL) 1,000 mL (07/22/19 1844)  . fentaNYL infusion INTRAVENOUS Stopped (07/21/19 0843)  . phenylephrine (NEO-SYNEPHRINE) Adult infusion Stopped (07/21/19 0950)   PRN Meds: acetaminophen **OR** acetaminophen (TYLENOL) oral liquid 160 mg/5 mL **OR** acetaminophen, fentaNYL, midazolam, ondansetron (ZOFRAN) IV, senna-docusate, sodium chloride flush   Vital Signs    Vitals:   07/23/19 0500 07/23/19 0600 07/23/19 0700 07/23/19 0800  BP: 117/81 119/84 118/78   Pulse: (!) 104 (!) 103 94   Resp: (!) 23 (!) 24 18   Temp:    98 F (36.7 C)  TempSrc:    Axillary  SpO2: 96% 98% 98%   Weight: 71 kg     Height:        Intake/Output Summary (Last 24 hours) at 07/23/2019 0925 Last data filed at 07/22/2019 1700 Gross per 24 hour  Intake 100 ml  Output 1080 ml  Net -980 ml   Filed Weights   07/19/19 2346 07/22/19 0500 07/23/19 0500  Weight: 68.8 kg 71.5 kg 71 kg    Telemetry    Atrial fib/flutter with a CVR, brief NSR with minimal pauses over 24 hours - Personally Reviewed  ECG    none - Personally Reviewed  Physical Exam   GEN: intubated and awake.   Neck: No JVD Cardiac:  IRRR, no murmurs, rubs, or gallops.  Respiratory: Clear to auscultation bilaterally. GI: Soft, nontender, non-distended  MS: No edema; No deformity. Neuro:  Nonfocal  Psych: Normal affect   Labs    Chemistry Recent Labs  Lab 07/19/19 2300 07/19/19 2305 07/21/19 0405 07/22/19 0643 07/23/19 0645  NA 140   < > 145 142 140  K 4.3   < > 4.3 3.6 3.8  CL 107   < > 118* 112* 103  CO2 24   < > 19* 23 27  GLUCOSE 105*   < > 125* 168* 133*  BUN 18   < > 12 13 15   CREATININE 0.92   < > 1.43* 1.01* 0.75  CALCIUM 9.2   < > 8.1* 8.2* 8.5*  PROT 7.8  --   --   --   --   ALBUMIN 4.0  --   --   --   --   AST 22  --   --   --   --   ALT 15  --   --   --   --   ALKPHOS 106  --   --   --   --   BILITOT 0.9  --   --   --   --  GFRNONAA >60   < > 37* 56* >60  GFRAA >60   < > 43* >60 >60  ANIONGAP 9   < > 8 7 10    < > = values in this interval not displayed.     Hematology Recent Labs  Lab 07/21/19 0405 07/22/19 0643 07/23/19 0645  WBC 9.4 10.1 8.0  RBC 4.12 3.98 4.55  HGB 12.2 11.8* 13.3  HCT 39.0 37.3 41.4  MCV 94.7 93.7 91.0  MCH 29.6 29.6 29.2  MCHC 31.3 31.6 32.1  RDW 14.6 14.4 13.7  PLT 214 147* 194    Cardiac EnzymesNo results for input(s): TROPONINI in the last 168 hours. No results for input(s): TROPIPOC in the last 168 hours.   BNPNo results for input(s): BNP, PROBNP in the last 168 hours.   DDimer No results for input(s): DDIMER in the last 168 hours.   Radiology    MR ANGIO HEAD WO CONTRAST  Result Date: 07/22/2019 CLINICAL DATA:  Stroke follow-up. Proximal left M2 occlusion status post revascularization. EXAM: MRI HEAD WITHOUT CONTRAST MRA HEAD WITHOUT CONTRAST TECHNIQUE: Multiplanar, multiecho pulse sequences of the brain and surrounding structures were obtained without intravenous contrast. Angiographic images of the head were obtained using MRA technique without contrast. COMPARISON:  Head CT 07/20/2019 and CTA 07/19/2019 FINDINGS: MRI HEAD FINDINGS The study  is mildly motion degraded. Brain: A large acute infarct is again noted in the left MCA superior division involving the lateral and anterior aspects of the frontal lobe and insula with cytotoxic edema, minimal petechial hemorrhage, and unchanged trace rightward midline shift. Scattered punctate acute infarcts are also present in the left parietal and superior occipital lobes. Susceptibility artifact in the left sylvian fissure and FLAIR signal within multiple sulci of the posterior left greater than right cerebral hemispheres is compatible with previously demonstrated small volume subarachnoid hemorrhage. Several scattered chronic microhemorrhages are noted peripherally in both cerebral hemispheres. There is no extra-axial fluid collection. Vascular: Major intracranial vascular flow voids are preserved. Skull and upper cervical spine: Unremarkable bone marrow signal. Sinuses/Orbits: Unremarkable orbits. Moderate left mastoid effusion. Clear paranasal sinuses. Other: None. MRA HEAD FINDINGS The visualized distal vertebral arteries are widely patent to the basilar and codominant. Patent AICAs and SCA is are seen bilaterally. The basilar artery is widely patent. There are large posterior communicating arteries bilaterally with hypoplastic right and absent left P1 segments. Both PCAs are patent without evidence of significant proximal stenosis. The internal carotid arteries are widely patent from skull base to carotid termini with ectasias of both cavernous segments. ACAs and MCAs are patent without evidence of significant A1 or M1 stenosis. The proximal left M2 superior division remains patent following revascularization, however there is a severe stenosis near/slightly distal to the site of previous occlusion. There is also asymmetric attenuation of more distal branch vessels in the left MCA superior division. No aneurysm is identified. IMPRESSION: 1. Large acute left MCA superior division infarct with minimal  petechial hemorrhage and unchanged minimal midline shift. 2. Punctate acute left parietal and occipital infarcts. 3. Residual small volume subarachnoid hemorrhage. 4. 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 Electronically Signed   By: Logan Bores M.D.   On: 07/22/2019 18:25   MR BRAIN WO CONTRAST  Result Date: 07/22/2019 CLINICAL DATA:  Stroke follow-up. Proximal left M2 occlusion status post revascularization. EXAM: MRI HEAD WITHOUT CONTRAST MRA HEAD WITHOUT CONTRAST TECHNIQUE: Multiplanar, multiecho pulse sequences of the brain and surrounding structures were obtained  without intravenous contrast. Angiographic images of the head were obtained using MRA technique without contrast. COMPARISON:  Head CT 07/20/2019 and CTA 07/19/2019 FINDINGS: MRI HEAD FINDINGS The study is mildly motion degraded. Brain: A large acute infarct is again noted in the left MCA superior division involving the lateral and anterior aspects of the frontal lobe and insula with cytotoxic edema, minimal petechial hemorrhage, and unchanged trace rightward midline shift. Scattered punctate acute infarcts are also present in the left parietal and superior occipital lobes. Susceptibility artifact in the left sylvian fissure and FLAIR signal within multiple sulci of the posterior left greater than right cerebral hemispheres is compatible with previously demonstrated small volume subarachnoid hemorrhage. Several scattered chronic microhemorrhages are noted peripherally in both cerebral hemispheres. There is no extra-axial fluid collection. Vascular: Major intracranial vascular flow voids are preserved. Skull and upper cervical spine: Unremarkable bone marrow signal. Sinuses/Orbits: Unremarkable orbits. Moderate left mastoid effusion. Clear paranasal sinuses. Other: None. MRA HEAD FINDINGS The visualized distal vertebral arteries are widely patent to the basilar and codominant. Patent AICAs and  SCA is are seen bilaterally. The basilar artery is widely patent. There are large posterior communicating arteries bilaterally with hypoplastic right and absent left P1 segments. Both PCAs are patent without evidence of significant proximal stenosis. The internal carotid arteries are widely patent from skull base to carotid termini with ectasias of both cavernous segments. ACAs and MCAs are patent without evidence of significant A1 or M1 stenosis. The proximal left M2 superior division remains patent following revascularization, however there is a severe stenosis near/slightly distal to the site of previous occlusion. There is also asymmetric attenuation of more distal branch vessels in the left MCA superior division. No aneurysm is identified. IMPRESSION: 1. 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 2. Punctate acute left parietal and occipital infarcts. 3. Residual small volume subarachnoid hemorrhage. 4. 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 Electronically Signed   By: Logan Bores M.D.   On: 07/22/2019 18:25    Cardiac Studies   none  Patient Profile     74 y.o. female admitted with a large left sided stroke due to atrial fib  Assessment & Plan    1. Stroke - reviewed MRI scan. She will likely have permanent severe deficits. Unclear about ability to speak with ETT in place. 2. Atrial fib/flutter - she is mostly out of rhythm but her VR is much better controlled. Continue amiodarone down OG tube. 3. Sinus node dysfunction - her post termination pauses have improved markedly. No indication for PPM at this time. 4. HTN - her bp is well controlled and actually low requiring low dose neo.  5. VDRF - she has undergone weaning trials. Unclear as to whether her airway will allow her to wean. She failed yesterday.      For questions or updates, please contact [facility] Please consult [email or web address] for contact info under Cardiology/STEMI.      Signed, Cristopher Peru, MD  07/23/2019, 9:25 AM  Patient ID: Jamie Mitchell, female   DOB: 03-26-1946, 74 y.o.   MRN: [id number]

## 2019-07-23 NOTE — Progress Notes (Addendum)
Pt monitor is now showing atrial flutter when initially she was in A Fib. RN made CCM aware.

## 2019-07-23 NOTE — Progress Notes (Signed)
Elink MD aware, no orders at this time.

## 2019-07-23 NOTE — Progress Notes (Signed)
STROKE TEAM PROGRESS NOTE   INTERVAL HISTORY Nurses at bedside. Neuro stable. Still intubated but weaning, breathing over the vent. No sedation, awake, alert. Can follow commands on the right side, will follow commands with visual cues and verbally (interpreter not at bedside and limited spanish), can squeeze with both hands. Improved today, nodding yes and no to simple questions.   Vitals:   07/23/19 0500 07/23/19 0600 07/23/19 0700 07/23/19 0800  BP: 117/81 119/84 118/78   Pulse: (!) 104 (!) 103 94   Resp: (!) 23 (!) 24 18   Temp:    98 F (36.7 C)  TempSrc:    Axillary  SpO2: 96% 98% 98%   Weight: 71 kg     Height:       CBC:  Recent Labs  Lab 07/19/19 2300 07/19/19 2305 07/22/19 0643 07/23/19 0645  WBC 6.9   < > 10.1 8.0  NEUTROABS 2.7  --   --   --   HGB 14.8   < > 11.8* 13.3  HCT 45.4   < > 37.3 41.4  MCV 92.5   < > 93.7 91.0  PLT 256   < > 147* 194   < > = values in this interval not displayed.   Basic Metabolic Panel:  Recent Labs  Lab 07/22/19 0643 07/22/19 0643 07/22/19 1643 07/23/19 0645  NA 142  --   --  140  K 3.6  --   --  3.8  CL 112*  --   --  103  CO2 23  --   --  27  GLUCOSE 168*  --   --  133*  BUN 13  --   --  15  CREATININE 1.01*  --   --  0.75  CALCIUM 8.2*  --   --  8.5*  MG 2.2   < > 2.2 2.0  PHOS 3.4   < > 3.5 3.6   < > = values in this interval not displayed.   Lipid Panel:     Component Value Date/Time   CHOL 113 07/20/2019 0356   CHOL 137 02/26/2017 1035   TRIG 57 07/20/2019 0356   HDL 36 (L) 07/20/2019 0356   HDL 35 (L) 02/26/2017 1035   CHOLHDL 3.1 07/20/2019 0356   VLDL 11 07/20/2019 0356   LDLCALC 66 07/20/2019 0356   LDLCALC 49 02/26/2017 1035   HgbA1c:  Lab Results  Component Value Date   HGBA1C 6.1 (H) 07/20/2019   HGBA1C 6.0 (H) 07/20/2019   Urine Drug Screen:     Component Value Date/Time   LABOPIA NONE DETECTED 07/21/2019 0058   COCAINSCRNUR NONE DETECTED 07/21/2019 0058   LABBENZ POSITIVE (A) 07/21/2019  0058   AMPHETMU NONE DETECTED 07/21/2019 0058   THCU NONE DETECTED 07/21/2019 0058   LABBARB NONE DETECTED 07/21/2019 0058    Alcohol Level     Component Value Date/Time   ETH <10 07/19/2019 2300    IMAGING past 48 hours MR ANGIO HEAD WO CONTRAST  Result Date: 07/22/2019 CLINICAL DATA:  Stroke follow-up. Proximal left M2 occlusion status post revascularization. EXAM: MRI HEAD WITHOUT CONTRAST MRA HEAD WITHOUT CONTRAST TECHNIQUE: Multiplanar, multiecho pulse sequences of the brain and surrounding structures were obtained without intravenous contrast. Angiographic images of the head were obtained using MRA technique without contrast. COMPARISON:  Head CT 07/20/2019 and CTA 07/19/2019 FINDINGS: MRI HEAD FINDINGS The study is mildly motion degraded. Brain: A large acute infarct is again noted in the left MCA superior division  involving the lateral and anterior aspects of the frontal lobe and insula with cytotoxic edema, minimal petechial hemorrhage, and unchanged trace rightward midline shift. Scattered punctate acute infarcts are also present in the left parietal and superior occipital lobes. Susceptibility artifact in the left sylvian fissure and FLAIR signal within multiple sulci of the posterior left greater than right cerebral hemispheres is compatible with previously demonstrated small volume subarachnoid hemorrhage. Several scattered chronic microhemorrhages are noted peripherally in both cerebral hemispheres. There is no extra-axial fluid collection. Vascular: Major intracranial vascular flow voids are preserved. Skull and upper cervical spine: Unremarkable bone marrow signal. Sinuses/Orbits: Unremarkable orbits. Moderate left mastoid effusion. Clear paranasal sinuses. Other: None. MRA HEAD FINDINGS The visualized distal vertebral arteries are widely patent to the basilar and codominant. Patent AICAs and SCA is are seen bilaterally. The basilar artery is widely patent. There are large posterior  communicating arteries bilaterally with hypoplastic right and absent left P1 segments. Both PCAs are patent without evidence of significant proximal stenosis. The internal carotid arteries are widely patent from skull base to carotid termini with ectasias of both cavernous segments. ACAs and MCAs are patent without evidence of significant A1 or M1 stenosis. The proximal left M2 superior division remains patent following revascularization, however there is a severe stenosis near/slightly distal to the site of previous occlusion. There is also asymmetric attenuation of more distal branch vessels in the left MCA superior division. No aneurysm is identified. IMPRESSION: 1. 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 2. Punctate acute left parietal and occipital infarcts. 3. Residual small volume subarachnoid hemorrhage. 4. 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 Electronically Signed   By: Logan Bores M.D.   On: 07/22/2019 18:25   MR BRAIN WO CONTRAST  Result Date: 07/22/2019 CLINICAL DATA:  Stroke follow-up. Proximal left M2 occlusion status post revascularization. EXAM: MRI HEAD WITHOUT CONTRAST MRA HEAD WITHOUT CONTRAST TECHNIQUE: Multiplanar, multiecho pulse sequences of the brain and surrounding structures were obtained without intravenous contrast. Angiographic images of the head were obtained using MRA technique without contrast. COMPARISON:  Head CT 07/20/2019 and CTA 07/19/2019 FINDINGS: MRI HEAD FINDINGS The study is mildly motion degraded. Brain: A large acute infarct is again noted in the left MCA superior division involving the lateral and anterior aspects of the frontal lobe and insula with cytotoxic edema, minimal petechial hemorrhage, and unchanged trace rightward midline shift. Scattered punctate acute infarcts are also present in the left parietal and superior occipital  lobes. Susceptibility artifact in the left sylvian fissure and FLAIR signal within multiple sulci of the posterior left greater than right cerebral hemispheres is compatible with previously demonstrated small volume subarachnoid hemorrhage. Several scattered chronic microhemorrhages are noted peripherally in both cerebral hemispheres. There is no extra-axial fluid collection. Vascular: Major intracranial vascular flow voids are preserved. Skull and upper cervical spine: Unremarkable bone marrow signal. Sinuses/Orbits: Unremarkable orbits. Moderate left mastoid effusion. Clear paranasal sinuses. Other: None. MRA HEAD FINDINGS The visualized distal vertebral arteries are widely patent to the basilar and codominant. Patent AICAs and SCA is are seen bilaterally. The basilar artery is widely patent. There are large posterior communicating arteries bilaterally with hypoplastic right and absent left P1 segments. Both PCAs are patent without evidence of significant proximal stenosis. The internal carotid arteries are widely patent from skull base to carotid termini with ectasias of both cavernous segments. ACAs and MCAs are patent without evidence of significant  A1 or M1 stenosis. The proximal left M2 superior division remains patent following revascularization, however there is a severe stenosis near/slightly distal to the site of previous occlusion. There is also asymmetric attenuation of more distal branch vessels in the left MCA superior division. No aneurysm is identified. IMPRESSION: 1. 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 2. Punctate acute left parietal and occipital infarcts. 3. Residual small volume subarachnoid hemorrhage. 4. 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 Electronically Signed   By: Logan Bores M.D.   On: 07/22/2019 18:25    PHYSICAL EXAM   Temp:  [98 F (36.7  C)-99.4 F (37.4 C)] 98 F (36.7 C) (02/28 0800) Pulse Rate:  [40-136] 94 (02/28 0700) Resp:  [16-24] 18 (02/28 0700) BP: (86-131)/(62-93) 118/78 (02/28 0700) SpO2:  [90 %-99 %] 98 % (02/28 0700) Arterial Line BP: (77-139)/(67-84) 77/67 (02/27 1400) Weight:  [71 kg] 71 kg (02/28 0500)  General - Well nourished, well developed, intubated off sedation.  Ophthalmologic - fundi not visualized due to noncooperation.  Cardiovascular - irregularly irregular heart rate and rhythm wheeze intermittent RVR.  Neuro - intubated off sedation, eyes open, follows command bilaterally in the uppers and lowers (show 2 fingers, wiggle toes) with visual cues and some simple voice commands, does not nod no/yes to questions, non verbal, did not follow any other commands.  Left gaze preference, but able to cross midline with intermittent right gaze. Blinking to visual threat on the left but not on the right, PERRL. Corneal reflex present, gag and cough present. Breathing over the vent.  Facial symmetry not able to test due to ET tube.  Tongue protrusion not cooperative. On pain stimulation, RUE 3+/5 with drift and RLE 3+/5. LUE and LLE spontaneous movement against gravity. DTR 1+ and toes equiv. Sensation, coordination and gait not tested.   ASSESSMENT/PLAN Jamie Mitchell is a 74 y.o. female with history of HTN, HLD and thyroid disease presenting with R hemiparesis, Left gaze preference, R facial weakness, R field cut and global aphasia. AF noted by EMS. Received tPA 07/19/2019 at 2317. Found to have  L M2 occlusion and taken to IR.   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 Old high L frontal infarct. ASPECTS 9.    CTA head & neck L M2 LVO. Diffuse tortuosity head and neck. Fetal  PCAs w/ small VB system.   Cerebral angio TICI2c revascularization LM2 superior division branch occlusion. Moderate L sylvian SAH   CT head post IR anterolateral L frontal lobe infarct in operculum, insula and subinsular region. Small to moderate L sylvian fissures into basal cisterns SAH.   Repeat CT head L sylvian fissure SAH less dense. L frontal operculum, insula and subinsular infarct well seen. Mild 1-35mm midline shift.  MRI /MRA 2/27 - Large acute left MCA superior division infarct with minimal petechial hemorrhage and unchanged minimal midline shift. Punctate acute left parietal and occipital infarcts. Residual small volume subarachnoid hemorrhage. 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  2D Echo EF 60-65%. No source of embolus   LDL 66  HgbA1c 6.0  UDS +benzos  SCDs for VTE prophylaxis  aspirin 81 mg daily prior to admission, now on No antithrombotic d/t focal SAH.  Signed out to team to start [facility] when appropriate. Repeat CT Head in the morning.   Therapy recommendations:  CIR recommended   Disposition:  pending   Paroxysmal Atrial Fibrillation/Atrial Flutter w/ post conversion pauses  afib RVR with pauses overnight  TSH 6.781->7.052, free T4 normal 0.80   CHA2DS2-VASc Score = at least 5, ?2 oral anticoagulation recommended  Age in Years:  46-74   +1    Sex:  Female   Female   +1    Hypertension History:  yes   +1     Diabetes Mellitus:  0  Congestive Heart Failure History:  0  Vascular Disease History:  0     Stroke/TIA/Thromboembolism History:  yes   +2 . Cardiology on board, appreciate help . Cardiology is debating on amiodarone drip . Now on Amiodarone 200 mg BID per tube  . Will consider anticoagulation once SAH resolves - MRI 2/27 - Residual small volume subarachnoid hemorrhage.   Acute Respiratory Failure  Intubated for IR, left intubated post IR for airway protection  Off Sedation   Did not tolerate  spontaneous breathing trials today  Not able to extubate this am  CCM onboard, will continue to try weaning this pm  Hypotension Hx of hypertension  Home meds:  HCTZ 25  Current SBP goal to 110-160 given SAH - BP currently within parameters . Off Neo drip now . Long-term BP goal normotensive  Hyperlipidemia  Home meds:  lipitor 40, resumed in hospital  LDL 66, goal < 70  Continue statin at discharge  Acute renal failure  Urinary retention,  > 1L with in and out   On Foley catheter now   AKI likely postobstructive.   Creatinine - 0.88->1.43->1.01->0.75  Close BMP monitoring  CCM on board  Prediabetes  HgbA1c 6.0, goal < 7.0  CBGs, SSI  PCP follow up  Other Stroke Risk Factors  Advanced age  Overweight, Body mass index is 28.63 kg/m., recommend weight loss, diet and exercise as appropriate   Other Active Problems  Hypothyroid on synthroid - TSH 6.781-> 7.052, free T4 normal 0.80  Vomited coffee ground emesis. PPI bid added. Hgb 13.3->12.2->11.8->13.3  Hospital day # 3  Personally examined patient and images, and have participated in and made any corrections needed to history, physical, neuro exam,assessment and plan as stated above.  I have personally obtained the history, evaluated lab date, reviewed imaging studies and agree with radiology interpretations.    Sarina Ill, MD Stroke Neurology   A total of 25 minutes was spent for the care of this patient, spent on counseling patient and family on different diagnostic and therapeutic options, counseling and coordination of care, riskd ans benefits of management, compliance, or risk factor reduction and education.     To contact Stroke Continuity provider, please refer to [email or web address] After hours, contact General Neurology

## 2019-07-23 NOTE — Progress Notes (Signed)
Rehab Admissions Coordinator Note:  Per PT recommendation, this patient was screened by Raechel Ache for appropriateness for an Inpatient Acute Rehab Consult.  At this time, we are recommending an Inpatient Rehab consult. AC will contact MD to request order.   Raechel Ache 07/23/2019, 11:21 AM  I can be reached at [phone].

## 2019-07-23 NOTE — Evaluation (Signed)
Clinical/Bedside Swallow Evaluation Patient Details  Name: Jamie Mitchell MRN: [id number] Date of Birth: 1945/11/28  Today's Date: 07/23/2019 Time: SLP Start Time (ACUTE ONLY): 1023 SLP Stop Time (ACUTE ONLY): 1037 SLP Time Calculation (min) (ACUTE ONLY): 14 min  Past Medical History:  Past Medical History:  Diagnosis Date  . Hypertension   . Thyroid disease    Past Surgical History:  Past Surgical History:  Procedure Laterality Date  . IR CT HEAD LTD  07/20/2019  . IR PERCUTANEOUS ART THROMBECTOMY/INFUSION INTRACRANIAL INC DIAG ANGIO  07/20/2019  . RADIOLOGY WITH ANESTHESIA N/A 07/20/2019   Procedure: IR WITH ANESTHESIA;  Surgeon: Luanne Bras, MD;  Location: [facility];  Service: Radiology;  Laterality: N/A;   HPI:  74 yo F who presented with right sided weakness found to have acute left M2/ MCA occlusion s/p tPA and emergent thrombectomy which was complicated by moderate left sylvian SAH.  On 2/25 patient with Afib/ Aflutter with RVR and post conversion pauses. VDRF and was extubated am of 2/27.     Assessment / Plan / Recommendation Clinical Impression  Patient presents with signs of an oropharyngeal dysphagia characterized by decreased labial seal and oral control of bolus with resulted anterior labial spillage and multiple rapid swallows with subtle throat clear but change in respirations/red face suggesting larger quantity aspiration than being sensed. High concern for silent aspiration. Discussed with son who was present and supportive, [place] and Spanish speaking to assist with communication with patient. Recommend continued NPO as patient is nutritionally supported, MBS next date to determine least restrictive diet.  SLP Visit Diagnosis: Dysphagia, oropharyngeal phase (R13.12)    Aspiration Risk  Other (comment)(TBD)    Diet Recommendation NPO   Medication Administration: Via alternative means    Other  Recommendations Oral Care Recommendations: Oral care QID    Follow up Recommendations (TBD)             Prognosis Prognosis for Safe Diet Advancement: Good      Swallow Study   General HPI: 74 yo F who presented with right sided weakness found to have acute left M2/ MCA occlusion s/p tPA and emergent thrombectomy which was complicated by moderate left sylvian SAH.  On 2/25 patient with Afib/ Aflutter with RVR and post conversion pauses. VDRF and was extubated am of 2/27.   Type of Study: Bedside Swallow Evaluation Previous Swallow Assessment: none in chart Diet Prior to this Study: NPO Temperature Spikes Noted: No Respiratory Status: Room air History of Recent Intubation: Yes Length of Intubations (days): 3 days Date extubated: 07/22/19 Behavior/Cognition: Alert;Cooperative;Pleasant mood(suspect aphasic, spanish speaking) Oral Cavity Assessment: Within Functional Limits Oral Care Completed by SLP: Recent completion by staff Oral Cavity - Dentition: Adequate natural dentition Vision: Functional for self-feeding Self-Feeding Abilities: Able to feed self;Needs assist Patient Positioning: Upright in bed Baseline Vocal Quality: Low vocal intensity(limited verbal output) Volitional Cough: Cognitively unable to elicit Volitional Swallow: Able to elicit    Oral/Motor/Sensory Function Overall Oral Motor/Sensory Function: Moderate impairment(difficult to fully assess due to suspected aphasia) Facial ROM: Reduced right;Suspected CN VII (facial) dysfunction Facial Symmetry: Within Functional Limits Facial Strength: (TBD) Lingual ROM: (TBD)   Ice Chips Ice chips: Within functional limits Presentation: Spoon   Thin Liquid Thin Liquid: Impaired Presentation: Cup Oral Phase Impairments: Reduced labial seal Oral Phase Functional Implications: Right anterior spillage Pharyngeal  Phase Impairments: Multiple swallows;Throat Clearing - Immediate;Throat Clearing - Delayed(change in respirations, red face)    Nectar Thick Nectar Thick Liquid: Not  tested  Honey Thick Honey Thick Liquid: Not tested   Puree Puree: Impaired Presentation: Spoon Oral Phase Impairments: Impaired mastication Oral Phase Functional Implications: Prolonged oral transit   Solid     Solid: Not tested     Jamie Rainwater MA, CCC-SLP   Jamie Mitchell 07/23/2019,11:28 AM

## 2019-07-24 ENCOUNTER — Inpatient Hospital Stay (HOSPITAL_COMMUNITY): Payer: Medicare Other

## 2019-07-24 LAB — BASIC METABOLIC PANEL
Anion gap: 10 (ref 5–15)
BUN: 24 mg/dL — ABNORMAL HIGH (ref 8–23)
CO2: 29 mmol/L (ref 22–32)
Calcium: 8.4 mg/dL — ABNORMAL LOW (ref 8.9–10.3)
Chloride: 101 mmol/L (ref 98–111)
Creatinine, Ser: 0.86 mg/dL (ref 0.44–1.00)
GFR calc Af Amer: >60 mL/min (ref >60)
GFR calc non Af Amer: >60 mL/min (ref >60)
Glucose, Bld: 135 mg/dL — ABNORMAL HIGH (ref 70–99)
Potassium: 3.6 mmol/L (ref 3.5–5.1)
Sodium: 140 mmol/L (ref 135–145)

## 2019-07-24 LAB — GLUCOSE, CAPILLARY
Glucose-Capillary: 104 mg/dL — ABNORMAL HIGH (ref 70–99)
Glucose-Capillary: 128 mg/dL — ABNORMAL HIGH (ref 70–99)
Glucose-Capillary: 129 mg/dL — ABNORMAL HIGH (ref 70–99)
Glucose-Capillary: 132 mg/dL — ABNORMAL HIGH (ref 70–99)
Glucose-Capillary: 142 mg/dL — ABNORMAL HIGH (ref 70–99)
Glucose-Capillary: 156 mg/dL — ABNORMAL HIGH (ref 70–99)

## 2019-07-24 LAB — CBC
HCT: 37.4 % (ref 36.0–46.0)
Hemoglobin: 12.1 g/dL (ref 12.0–15.0)
MCH: 29.6 pg (ref 26.0–34.0)
MCHC: 32.4 g/dL (ref 30.0–36.0)
MCV: 91.4 fL (ref 80.0–100.0)
Platelets: 192 10*3/uL (ref 150–400)
RBC: 4.09 MIL/uL (ref 3.87–5.11)
RDW: 13.5 % (ref 11.5–15.5)
WBC: 6.1 10*3/uL (ref 4.0–10.5)
nRBC: 0 % (ref 0.0–0.2)

## 2019-07-24 IMAGING — CT CT HEAD W/O CM
4 series · 15 of 47 positions shown, 17 images · non-contrast
Comparison: CT head without contrast [DATE]. MR head without
contrast [DATE]

CLINICAL DATA: Stroke, follow-up. Revascularization of left M2
division.

EXAM:
CT HEAD WITHOUT CONTRAST
TECHNIQUE: Contiguous axial images were obtained from the base of the skull
through the vertex without intravenous contrast.

[Series 5: cor soft · coronal · 0.28mm/px · 3 of 63 slices shown]
[im 21/63  brain]
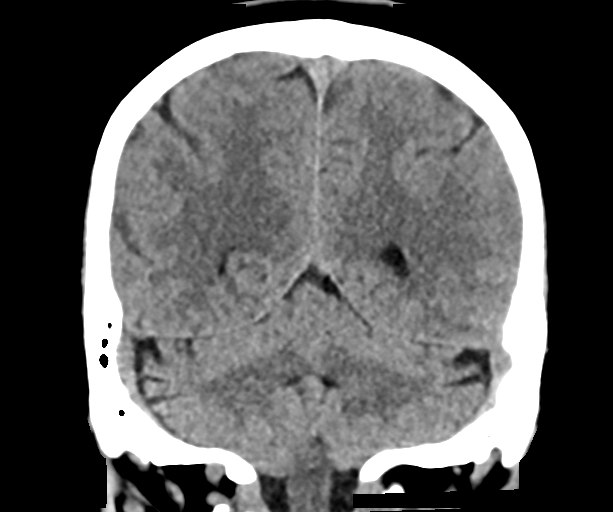
[im 28/63  brain]
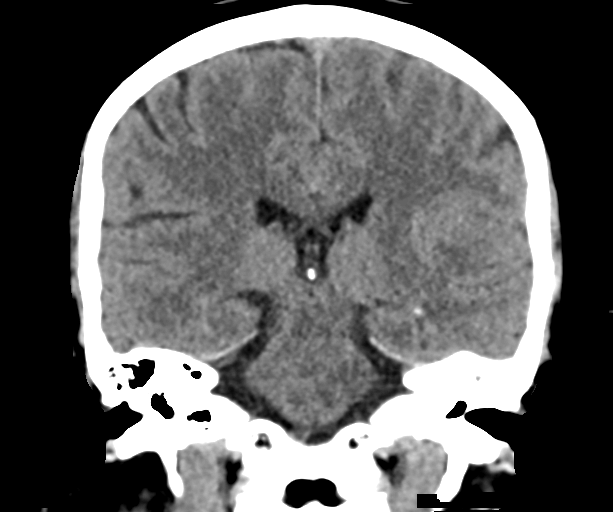
[im 35/63  brain]
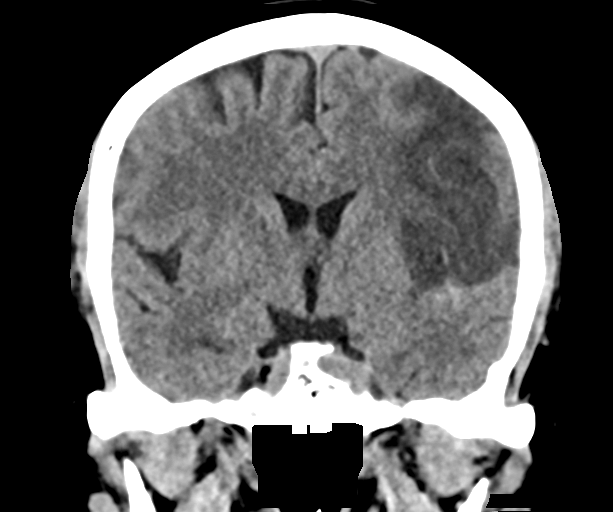

[Series 6: sag soft · sagittal · 0.28mm/px · 3 of 56 slices shown]
[im 19/56  brain]
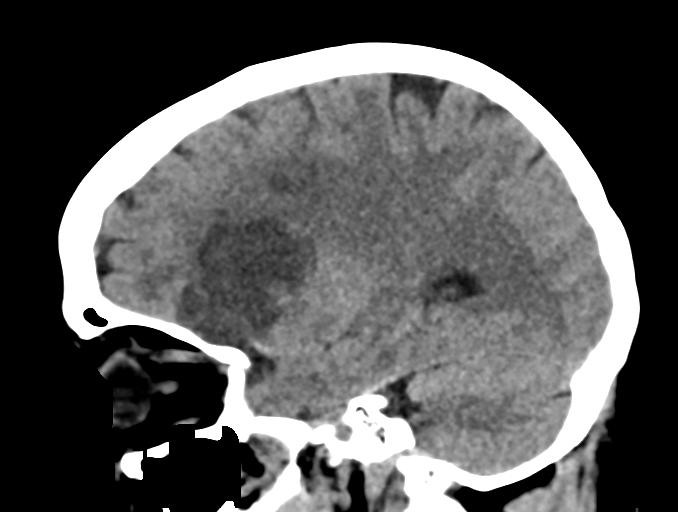
[im 28/56  brain]
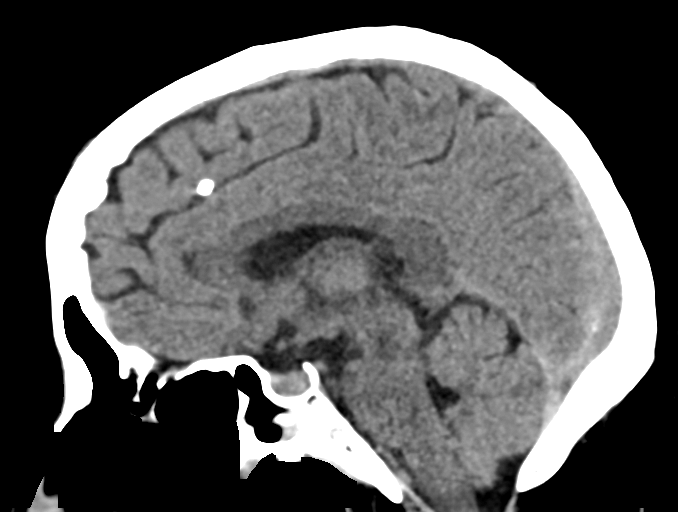
[im 37/56  brain]
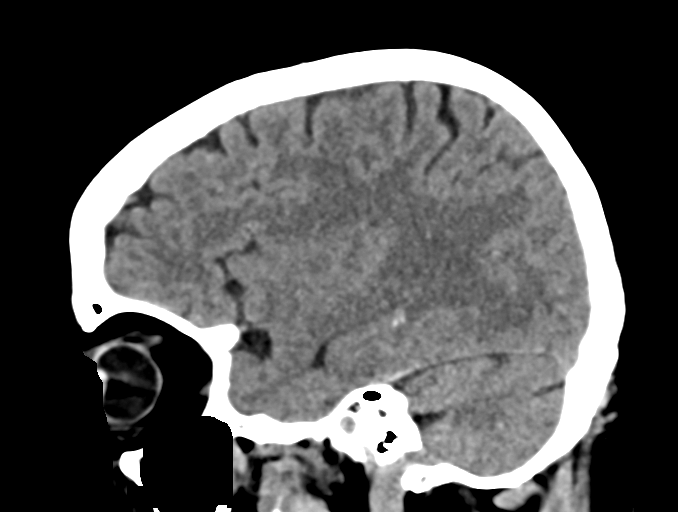

[Series 7: head wo · axial · 0.33mm/px · z∈[-155,-50]mm · 7 of 29 slices shown, 9 images]
[im 4/29  brain]
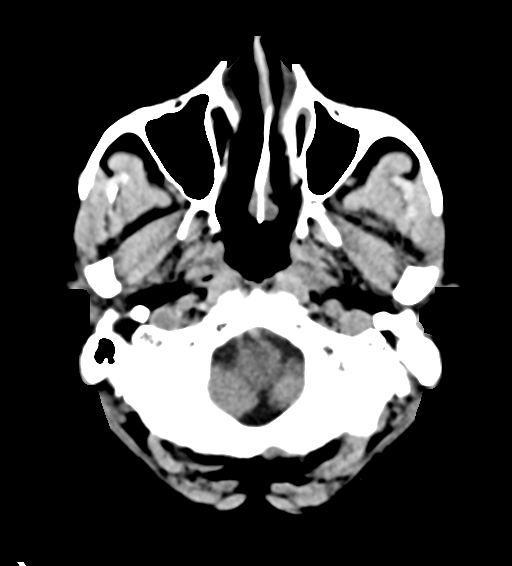
[im 4/29  bone]
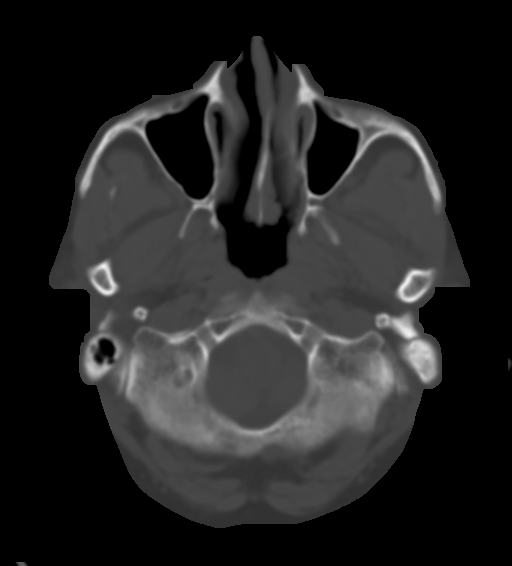
[im 8/29  brain]
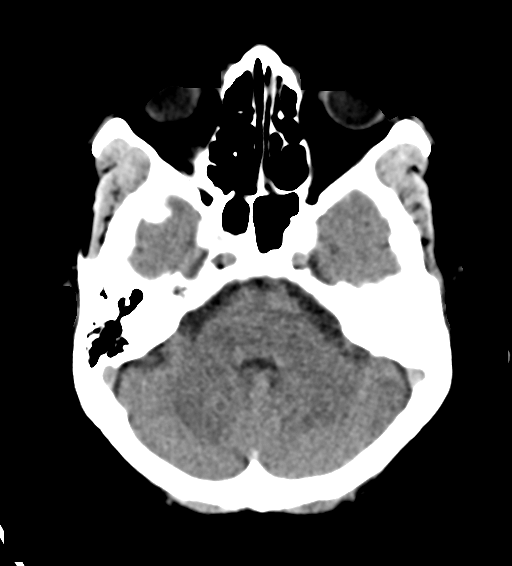
[im 11/29  brain]
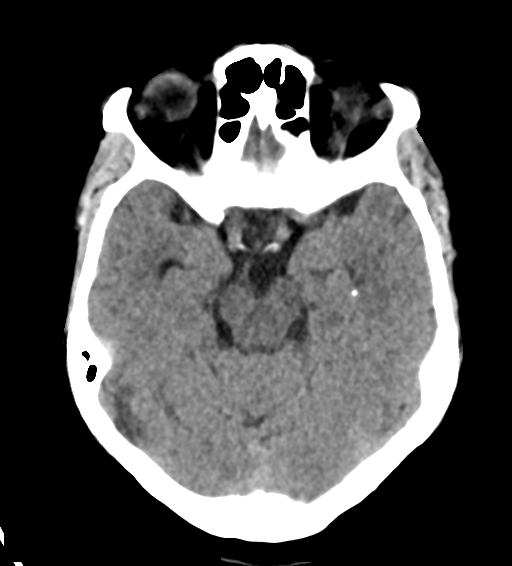
[im 15/29  brain]
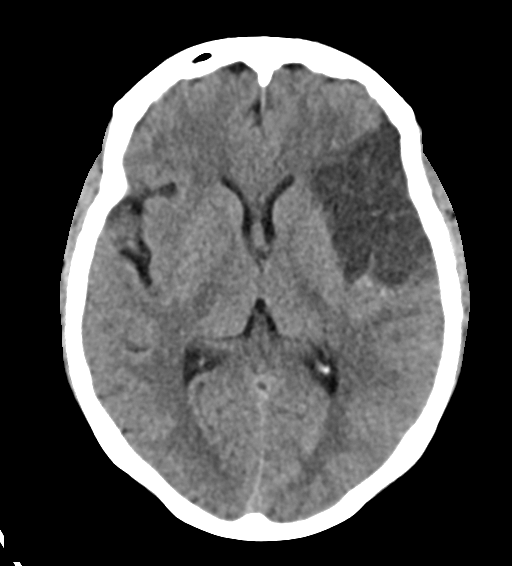
[im 18/29  brain]
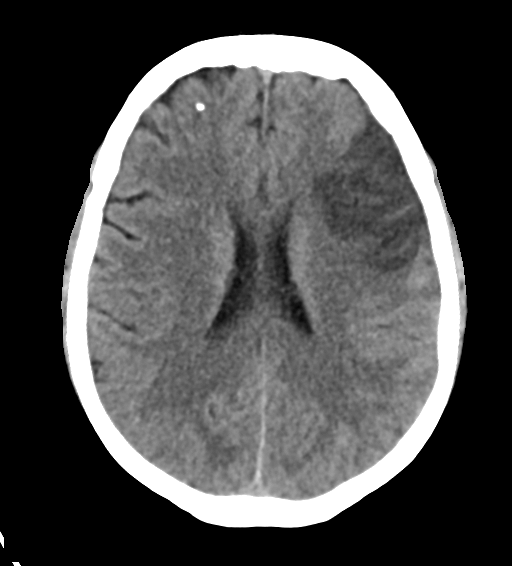
[im 18/29  bone]
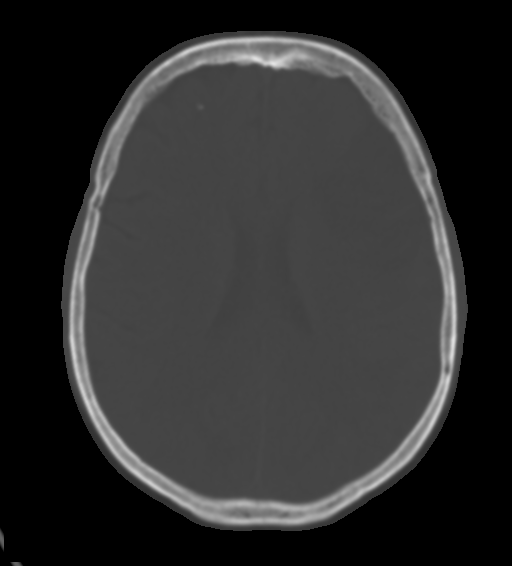
[im 22/29  brain]
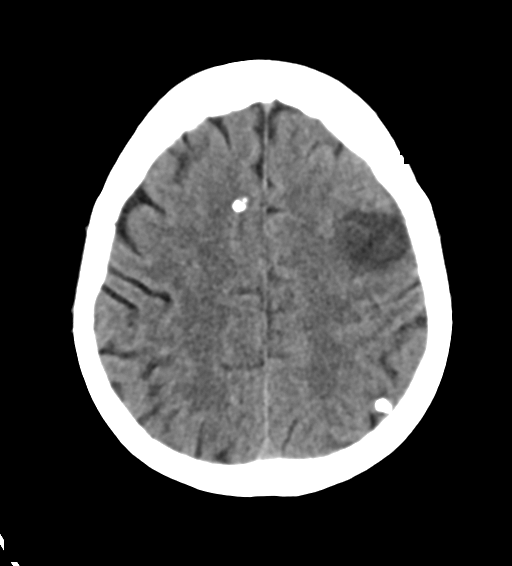
[im 25/29  brain]
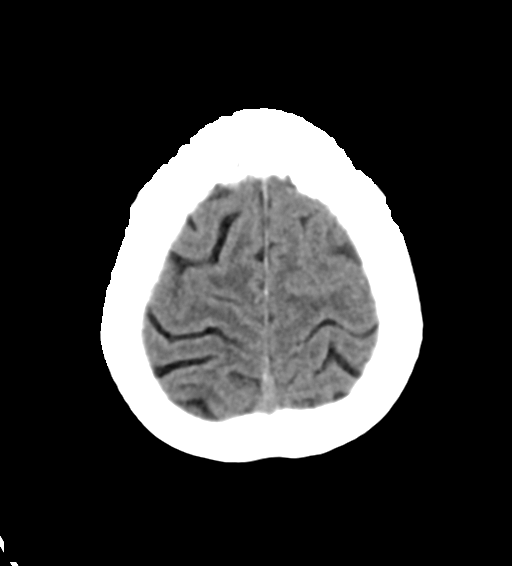

[Series 8: head bone · axial · 0.33mm/px · z∈[-160,-146]mm · 2 of 72 slices shown]
[im 8/72  bone]
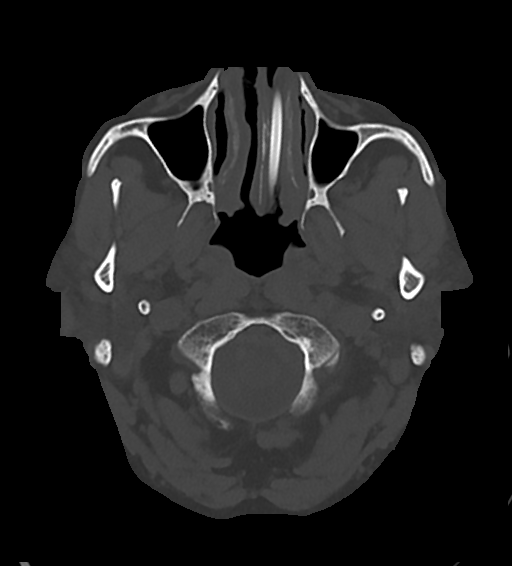
[im 15/72  bone]
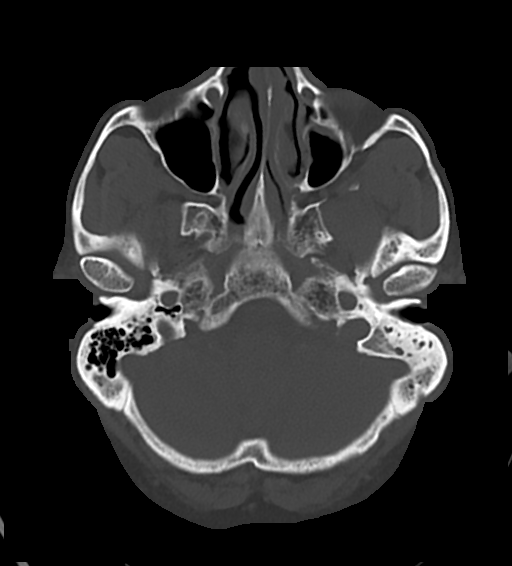

[15 of 47 positions shown; findings below may reference images not displayed]

FINDINGS: Brain: Left frontal operculum nonhemorrhagic infarct is stable.
Punctate infarcts of the left parietal lobe are not appreciated by
CT. Trace blood in the left sylvian fissure is resolving.

Mass effect with 3 mm midline shift is noted. Ventricles are
otherwise normal. No new infarcts are present. The brainstem and
cerebellum are normal. Scattered parenchymal calcifications are
again seen.

Vascular: No hyperdense vessel or unexpected calcification.

Skull: No significant extracranial soft tissue lesion is present.
Calvarium is intact. No focal lytic or blastic lesions are present.

Sinuses/Orbits: Chronic left middle ear and mastoid effusion is
present. Paranasal sinuses and right mastoid air cells are clear.
The globes and orbits are within normal limits.
IMPRESSION: 1. Stable left frontal operculum nonhemorrhagic infarct.
2. Resolving subarachnoid blood in the left Sylvian fissure.
3. No new infarcts.
4. Chronic left middle ear and mastoid effusion.

## 2019-07-24 MED ORDER — ENSURE ENLIVE PO LIQD
237.0000 mL | Freq: Two times a day (BID) | ORAL | Status: DC
  Administered 2019-07-24: 237 mL via ORAL

## 2019-07-24 MED ORDER — OSMOLITE 1.2 CAL PO LIQD
1000.0000 mL | ORAL | Status: DC
  Administered 2019-07-24: 1000 mL
  Filled 2019-07-24 (×2): qty 1000

## 2019-07-24 MED ORDER — ASPIRIN EC 81 MG PO TBEC
81.0000 mg | DELAYED_RELEASE_TABLET | Freq: Every day | ORAL | Status: DC
  Administered 2019-07-24 – 2019-07-25 (×2): 81 mg via ORAL
  Filled 2019-07-24 (×2): qty 1

## 2019-07-24 NOTE — Progress Notes (Signed)
Modified Barium Swallow Progress Note  Patient Details  Name: Jamie Mitchell MRN: [id number] Date of Birth: 1946/01/01  Today's Date: 07/24/2019  Modified Barium Swallow completed.  Full report located under Chart Review in the Imaging Section.  Brief recommendations include the following:  Clinical Impression  Pt demonstrates a primary oral dysphagia with slow volitional initaition of swallow activities.  Paticular difficulty initiating and coordinating mastication, which was slow, but complete with mild right buccal residue.  Pt has mild right labial weakness with anterior spillage with large quantitiy rapid self feeding. She is able to sip from a straw, and ultimately this was most effective, with assist, to control bolus size and rate. When self feeding pt will often take multiple sips with complete oral cohesion of the bolus before triggered posterior transit and swallow response. There is therefore typically piecemeal transit of a very large bolus. Pharyngeal phase generally is WNL except for some mild oral/base of tongue transit that accumulates in the valleculae. Pt only experienced one instance of sensed aspiration when allowed to very impulsively take independent cup sips, throwing head back and resulting in an instance of premature spillage. Cough response immediate, hard and effective. Will initiate a dys 1 (puree) diet with thin liquids with good potential for upgraded solids with therapy. Suspect automaticity will improve with intake.    Swallow Evaluation Recommendations       SLP Diet Recommendations: Dysphagia 1 (Puree) solids;Thin liquid   Liquid Administration via: Straw   Medication Administration: Crushed with puree   Supervision: Staff to assist with self feeding;Full supervision/cueing for compensatory strategies   Compensations: Slow rate;Small sips/bites   Postural Changes: Seated upright at 90 degrees   Oral Care Recommendations: Oral care BID        Herbie Baltimore, MA CCC-SLP  Acute Rehabilitation Services Pager [phone] Office [phone]  Lynann Beaver 07/24/2019,9:02 AM

## 2019-07-24 NOTE — Progress Notes (Signed)
Inpatient Rehabilitation Admissions Coordinator  I met with patient at bedside with her son, [name]. We discussed goals and expectations of an inpt rehab admit. They are in agreement. I will follow up tomorrow for possible admit pending bed availability.   Danne Baxter, RN, MSN Rehab Admissions Coordinator [phone] 07/24/2019 3:54 PM

## 2019-07-24 NOTE — PMR Pre-admission (Signed)
PMR Admission Coordinator Pre-Admission Assessment  Patient: Jamie Mitchell is an 74 y.o., female MRN: [id number] DOB: Nov 12, 1945 Height: 5\' 2"  (157.5 cm) Weight: 71 kg              Insurance Information HMO:     PPO:      PCP:      IPA:      80/20:      OTHER:  PRIMARY: Medicare a and b      Policy#: [id number]      Subscriber: pt Benefits:  Phone #: passport one online     Name: 3/1 Eff. Date: a 08/24/2015 and b 11/22/2016     Deduct: $1484      Out of Pocket Max: none      Life Max: none CIR: 100%      SNF: 20 full days Outpatient: 80%     Co-Pay: 20% Home Health: 100%      Co-Pay: none DME: 80%     Co-Pay: 20% Providers: pt choice  Patient is a permanent resident  SECONDARY:  Medicaid of [place] [id number] t   3/2 per passport one not currently active  Medicaid Application Date:       Case Manager:  Disability Application Date:       Case Worker:   The "Data Collection Information Summary" for patients in Inpatient Rehabilitation Facilities with attached "Privacy Act [place] Records" was provided and verbally reviewed with: Patient and family  Emergency Contact Information Contact Information    Name Relation Home Work Mobile   [place] Son [phone]       Current Medical History  Patient Admitting Diagnosis: CVA  History of Present Illness:  Jamie Mitchell is a 74 y.o. limited English speaking right-handed female with history of hypertension as well as hypothyroidism.  Per chart review patient lives alone in [place] was here visiting family in the area for the last 6 months with her son.  Mobile home 7 steps to entry.  Presented 07/19/2019 with right-sided weakness.  Cranial CT scan showed subtle loss of gray-white matter differentiation involving the left frontal operculum concerning for acute left MCA territory infarction.  Small chronic high left frontal infarction.  Patient did receive TPA.  CT angiogram of head and neck positive for emergent  large vessel occlusion with occlusive thrombus within proximal left M2 branch superior division.  Patient underwent revascularization per interventional radiology remain intubated for a short time extubated 07/23/2019.  Echocardiogram with ejection fraction 65% without emboli.  Cardiology follow-up for bouts of atrial fibrillation and flutter and currently maintained on amiodarone.  Dysphagia #1 thin liquid diet.  Nasogastric tube have been in place for nutritional support and to be discontinued today per Dr. Leonie Man  Therapy evaluations completed with recommendations of physical medicine rehab consult.  Complete NIHSS TOTAL: 7 Glasgow Coma Scale Score: 15  Past Medical History  Past Medical History:  Diagnosis Date  . Hypertension   . Thyroid disease     Family History  Family history is unknown by patient.  Prior Rehab/Hospitalizations:  Has the patient had prior rehab or hospitalizations prior to admission? Yes  Has the patient had major surgery during 100 days prior to admission? No  Current Medications   Current Facility-Administered Medications:  .  [COMPLETED] alteplase (ACTIVASE) 1 mg/mL infusion 61.9 mg, 0.9 mg/kg, Intravenous, Once, Stopped at 07/20/19 2142 **FOLLOWED BY** 0.9 %  sodium chloride infusion, 50 mL, Intravenous, Once, Kerney Elbe, MD .  0.9 %  sodium chloride  infusion, 250 mL, Intravenous, Continuous, Anders Simmonds, MD .  acetaminophen (TYLENOL) tablet 650 mg, 650 mg, Oral, Q4H PRN, 650 mg at 07/23/19 2122 **OR** acetaminophen (TYLENOL) 160 MG/5ML solution 650 mg, 650 mg, Per Tube, Q4H PRN, 650 mg at 07/23/19 0954 **OR** acetaminophen (TYLENOL) suppository 650 mg, 650 mg, Rectal, Q4H PRN, Kerney Elbe, MD .  amiodarone (PACERONE) tablet 200 mg, 200 mg, Per Tube, BID, Shirley Friar, PA-C, 200 mg at 07/25/19 0854 .  aspirin EC tablet 81 mg, 81 mg, Oral, Daily, Biby, Sharon L, NP, 81 mg at 07/25/19 0854 .  atorvastatin (LIPITOR) tablet 40 mg, 40 mg, Per  Tube, Daily, Rosalin Hawking, MD, 40 mg at 07/25/19 0854 .  atropine 1 MG/10ML injection 1 mg, 1 mg, Intravenous, Once, Anders Simmonds, MD .  chlorhexidine (PERIDEX) 0.12 % solution 15 mL, 15 mL, Mouth Rinse, BID, Rosalin Hawking, MD, 15 mL at 07/25/19 0855 .  Chlorhexidine Gluconate Cloth 2 % PADS 6 each, 6 each, Topical, Daily, Rosalin Hawking, MD, 6 each at 07/24/19 1041 .  feeding supplement (ENSURE ENLIVE) (ENSURE ENLIVE) liquid 237 mL, 237 mL, Oral, BID BM, Garvin Fila, MD, 237 mL at 07/24/19 1526 .  feeding supplement (OSMOLITE 1.2 CAL) liquid 1,000 mL, 1,000 mL, Per Tube, Q24H, Garvin Fila, MD, Last Rate: 40 mL/hr at 07/24/19 1734, 1,000 mL at 07/24/19 1734 .  insulin aspart (novoLOG) injection 0-9 Units, 0-9 Units, Subcutaneous, Q4H, Jacalyn Lefevre, MD, 1 Units at 07/25/19 0854 .  levothyroxine (SYNTHROID, LEVOTHROID) injection 37.5 mcg, 37.5 mcg, Intravenous, Daily, Jennelle Human B, NP, 37.5 mcg at 07/25/19 0906 .  MEDLINE mouth rinse, 15 mL, Mouth Rinse, q12n4p, Rosalin Hawking, MD, 15 mL at 07/24/19 1735 .  pantoprazole (PROTONIX) injection 40 mg, 40 mg, Intravenous, Q24H, Simpson, Paula B, NP, 40 mg at 07/24/19 2216 .  senna-docusate (Senokot-S) tablet 1 tablet, 1 tablet, Oral, QHS PRN, Kerney Elbe, MD .  sodium chloride flush (NS) 0.9 % injection 10-40 mL, 10-40 mL, Intracatheter, PRN, Rosalin Hawking, MD  Patients Current Diet:  Diet Order            DIET - DYS 1 Room service appropriate? 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  Independent  Indoor Mobility: Did the patient need assistance with walking from room to room (with or without device)? Independent  Stairs: Did the patient need assistance with internal or external stairs (with or without device)? Independent  Functional Cognition: Did the patient need help planning regular tasks such as shopping or remembering to take medications? Independent  Home Assistive Devices / Equipment Home Equipment: None  Prior Device Use: Indicate devices/aids used by the patient prior to current illness, exacerbation or injury? None of the above  Current Functional Level Cognition  Overall Cognitive Status: Impaired/Different from baseline Current Attention Level: Sustained Orientation Level: Oriented to person, Other (comment)(Pt speaks Spanish and has expressive aphagia) Following Commands: Follows  one step commands with increased time, Follows one step commands inconsistently Safety/Judgement: Decreased awareness of safety, Decreased awareness of deficits General Comments: Pt with expressive aphasia and questionable receptive aphasia superimposed on non-English Speaking (Spanish).  Pt will follow a basic command, but has difficulty attending to her right side without multimodal cues, cues and assist for safety with RW use, eating (which she was doing with son when we entered the room).     Extremity Assessment (includes Sensation/Coordination)  Upper Extremity Assessment: RUE deficits/detail RUE Deficits / Details: AROM WFL; grossly 3+/5 throught. Decreased in hand manipulation skills and hand wekaness; [place] brush teeth using R hand RUE Coordination: decreased fine motor, decreased gross motor  Lower Extremity Assessment: Defer to PT evaluation    ADLs  Overall ADL's : Needs assistance/impaired Eating/Feeding: Minimal assistance Eating/Feeding Details (indicate cue type and reason):  spillage R side of mouth Grooming: Minimal assistance Upper Body Bathing: Minimal assistance, Sitting Lower Body Bathing: Moderate assistance, Sit to/from stand Upper Body Dressing : Moderate assistance, Sitting Lower Body Dressing: Moderate assistance, Sit to/from stand Toilet Transfer: Minimal assistance, Ambulation Toileting- Clothing Manipulation and Hygiene: Minimal assistance, Sit to/from stand Functional mobility during ADLs: Minimal assistance, Cueing for safety General ADL Comments: Pt issued lidded cup. Does better holding handled cup adn using straw. Continues to have spillage but pt inititaing bringing cloth to mouth    Mobility  Overal bed mobility: Needs Assistance Bed Mobility: Supine to Sit Supine to sit: Min assist General bed mobility comments: min assist to support trunk during transitions for balance.  Cues to scoot out to the edge of the bed once seated and not to go immediatly to standing.     Transfers  Overall transfer level: Needs assistance Equipment used: Rolling walker (2 wheeled) Transfers: Sit to/from Stand Sit to Stand: Min assist General transfer comment: Min assist to help power up, stabilize both hands on RW.  Hand over hand assist for R hand use on RW and for transitions sit/stand.     Ambulation / Gait / Stairs / Wheelchair Mobility  Ambulation/Gait Ambulation/Gait assistance: Min assist, +2 safety/equipment Gait Distance (Feet): 85 Feet Assistive device: Rolling walker (2 wheeled) Gait Pattern/deviations: Step-through pattern, Decreased step length - right General Gait Details: Does not attend to obstacles on her right, difficult to get to look right and turn right in the hallway, so we practiced looking and turning right.  Fatigues quickly, so we did not make it far down the hallway.   Gait velocity: too fast to be safe, cues for slowing speed.  Gait velocity interpretation: <1.31 ft/sec, indicative of household ambulator    Posture / Balance  Dynamic Sitting Balance Sitting balance - Comments: close supervision EOB Balance Overall balance assessment: Needs assistance Sitting-balance support: Feet supported, Bilateral upper extremity supported Sitting balance-Leahy Scale: Fair Sitting balance - Comments: close supervision EOB Standing balance support: Bilateral upper extremity supported Standing balance-Leahy Scale: Poor Standing balance comment: needs external assist in standing for balance     Special needs/care consideration BiPAP/CPAP CPM Continuous Drip IV Dialysis        Life Vest Oxygen Special Bed Trach Size Wound Vac  Skin non pressure wound to back Bowel mgmt:incontinent Bladder mgmt:external catheter Diabetic mgmt Hgb A1c 6.0 Behavioral consideration  Chemo/radiation  43 inch 10 FR left nare cortrak placed 2/26. TO be discontinued today  Designated visitor is [place] Spanish interpretor to be scheduled with therapy   Previous Home Environment  Living Arrangements: (lives with spouse and goes  every few months between [place] a)  Lives With: Spouse Available Help at Discharge: (spouse, son and his wife and their 4 children in [place]. [place]) Type of Home: Mobile home Home Layout: One level Home Access: Stairs to enter Entrance Stairs-Rails: Right, Left, Can reach both Entrance Stairs-Number of Steps: 7 Bathroom Shower/Tub: Chiropodist: Standard Bathroom Accessibility: Yes How Accessible: Accessible via walker [place]: No  Discharge Living Setting Plans for Discharge Living Setting: Lives with (comment)(stays with son and his family every few months and they aslo) Type of Home at Discharge: Mobile home Discharge Home Layout: One level Discharge Home Access: Stairs to enter Entrance Stairs-Rails: Right, Left, Can reach both Entrance Stairs-Number of Steps: 7 Discharge Bathroom Shower/Tub: Tub/shower unit Discharge Bathroom Toilet: Standard Discharge Bathroom  Accessibility: Yes How Accessible: Accessible via walker Does the patient have any problems obtaining your medications?: No  Social/Family/Support Systems Patient Roles: Spouse, Parent Contact Information: son, [name]. He speaks fluent ENglish Anticipated Caregiver: son, son's wife and pt's spouse Anticipated Caregiver's Contact Information: [phone] Ability/Limitations of Caregiver: son works, but his wife and pt's husband do not Caregiver Availability: 24/7 Discharge Plan Discussed with Primary Caregiver: Yes Is Caregiver In Agreement with Plan?: Yes Does Caregiver/Family have Issues with Lodging/Transportation while Pt is in Rehab?: No   Goals/Additional Needs Patient/Family Goal for Rehab: Mod I to supervision with PT, OT and SLP Expected length of stay: [facility] 16 to 20 days Cultural Considerations: [place] speaker; does not speak any Solicitor Service Needs: Catholic Additional Information: son plans to be here every day during visiting hours Pt/Family Agrees to Admission and willing to participate: Yes Program Orientation Provided & Reviewed with Pt/Caregiver Including Roles  & Responsibilities: Yes  Decrease burden of Care through IP rehab admission:   Possible need for SNF placement upon discharge:   Patient Condition: This patient's condition remains as documented in the consult dated 07/24/2019, in which the Rehabilitation Physician determined and documented that the patient's condition is appropriate for intensive rehabilitative care in an inpatient rehabilitation facility. Will admit to inpatient rehab today.  Preadmission Screen Completed By:  Cleatrice Burke, RN, 07/25/2019 11:51 AM ______________________________________________________________________   Discussed status with Dr. Dagoberto Ligas on 07/25/2019 at  1200 and received approval for admission today.  Admission Coordinator:  Cleatrice Burke, time 1200 Date 07/25/2019

## 2019-07-24 NOTE — Progress Notes (Addendum)
Progress Note  Patient Name: Jamie Mitchell Date of Encounter: 07/24/2019  Primary Cardiologist: Donato Heinz, MD   Subjective   Difficult/unable to assess well.   Spanish translator ([employer]) Rob Bunting [phone] used for our visit  The patient nods yes to questions, follows most instructions, not all Moving all extremeties grossly Does not appear able to speak  Discussed with RN, this is as far as he knows her baseline post stroke  Inpatient Medications    Scheduled Meds: . amiodarone  200 mg Per Tube BID  . atorvastatin  40 mg Per Tube Daily  . atropine  1 mg Intravenous Once  . chlorhexidine  15 mL Mouth Rinse BID  . Chlorhexidine Gluconate Cloth  6 each Topical Daily  . feeding supplement (PRO-STAT SUGAR FREE 64)  30 mL Per Tube BID  . insulin aspart  0-9 Units Subcutaneous Q4H  . levothyroxine  37.5 mcg Intravenous Daily  . mouth rinse  15 mL Mouth Rinse q12n4p  . pantoprazole (PROTONIX) IV  40 mg Intravenous Q24H   Continuous Infusions: . sodium chloride    . sodium chloride    . feeding supplement (OSMOLITE 1.2 CAL) 1,000 mL (07/23/19 2137)   PRN Meds: acetaminophen **OR** acetaminophen (TYLENOL) oral liquid 160 mg/5 mL **OR** acetaminophen, ondansetron (ZOFRAN) IV, senna-docusate, sodium chloride flush   Vital Signs    Vitals:   07/23/19 1615 07/23/19 2008 07/23/19 2344 07/24/19 0409  BP: 105/64 117/66 95/66 (!) 101/59  Pulse: 60 63 (!) 59 (!) 57  Resp: 16 19 18 18   Temp: 98.7 F (37.1 C) 98.2 F (36.8 C) 98.1 F (36.7 C) 97.9 F (36.6 C)  TempSrc: Oral Oral Oral Oral  SpO2: 97% 98% 97% 98%  Weight:      Height:        Intake/Output Summary (Last 24 hours) at 07/24/2019 0849 Last data filed at 07/23/2019 1900 Gross per 24 hour  Intake 1320 ml  Output --  Net 1320 ml   Last 3 Weights 07/23/2019 07/22/2019 07/19/2019  Weight (lbs) 156 lb 8.4 oz 157 lb 10.1 oz 151 lb 10.8 oz  Weight (kg) 71 kg 71.5 kg 68.8 kg      Telemetry     AFlutter > AFib 80's generally >> SR yesterday - Personally Reviewed  ECG    No new EKGs - Personally Reviewed  Physical Exam   GEN: No acute distress.   Neck: No JVD Cardiac: RRR, no murmurs, rubs, or gallops.  Respiratory: CTA b/l. GI: Soft, nontender, non-distended  MS: No edema; No deformity. Neuro: aphasic Psych: unable to assess well   Labs    High Sensitivity Troponin:  No results for input(s): TROPONINIHS in the last 720 hours.    Chemistry Recent Labs  Lab 07/19/19 2300 07/19/19 2305 07/22/19 0643 07/23/19 0645 07/24/19 0529  NA 140   < > 142 140 140  K 4.3   < > 3.6 3.8 3.6  CL 107   < > 112* 103 101  CO2 24   < > 23 27 29   GLUCOSE 105*   < > 168* 133* 135*  BUN 18   < > 13 15 24*  CREATININE 0.92   < > 1.01* 0.75 0.86  CALCIUM 9.2   < > 8.2* 8.5* 8.4*  PROT 7.8  --   --   --   --   ALBUMIN 4.0  --   --   --   --   AST 22  --   --   --   --  ALT 15  --   --   --   --   ALKPHOS 106  --   --   --   --   BILITOT 0.9  --   --   --   --   GFRNONAA >60   < > 56* >60 >60  GFRAA >60   < > >60 >60 >60  ANIONGAP 9   < > 7 10 10    < > = values in this interval not displayed.     Hematology Recent Labs  Lab 07/22/19 0643 07/23/19 0645 07/24/19 0529  WBC 10.1 8.0 6.1  RBC 3.98 4.55 4.09  HGB 11.8* 13.3 12.1  HCT 37.3 41.4 37.4  MCV 93.7 91.0 91.4  MCH 29.6 29.2 29.6  MCHC 31.6 32.1 32.4  RDW 14.4 13.7 13.5  PLT 147* 194 192    BNPNo results for input(s): BNP, PROBNP in the last 168 hours.   DDimer No results for input(s): DDIMER in the last 168 hours.   Radiology    CT HEAD WO CONTRAST  Result Date: 07/24/2019 CLINICAL DATA:  Stroke, follow-up. Revascularization of left M2 division. EXAM: CT HEAD WITHOUT CONTRAST TECHNIQUE: Contiguous axial images were obtained from the base of the skull through the vertex without intravenous contrast. COMPARISON:  CT head without contrast 07/20/2019. MR head without contrast 07/22/2019 FINDINGS: Brain: Left  frontal operculum nonhemorrhagic infarct is stable. Punctate infarcts of the left parietal lobe are not appreciated by CT. Trace blood in the left sylvian fissure is resolving. Mass effect with 3 mm midline shift is noted. Ventricles are otherwise normal. No new infarcts are present. The brainstem and cerebellum are normal. Scattered parenchymal calcifications are again seen. Vascular: No hyperdense vessel or unexpected calcification. Skull: No significant extracranial soft tissue lesion is present. Calvarium is intact. No focal lytic or blastic lesions are present. Sinuses/Orbits: Chronic left middle ear and mastoid effusion is present. Paranasal sinuses and right mastoid air cells are clear. The globes and orbits are within normal limits. IMPRESSION: 1. Stable left frontal operculum nonhemorrhagic infarct. 2. Resolving subarachnoid blood in the left Sylvian fissure. 3. No new infarcts. 4. Chronic left middle ear and mastoid effusion. Electronically Signed   By: San Morelle M.D.   On: 07/24/2019 06:27   MR ANGIO HEAD WO CONTRAST  Result Date: 07/22/2019 CLINICAL DATA:  Stroke follow-up. Proximal left M2 occlusion status post revascularization. EXAM: MRI HEAD WITHOUT CONTRAST MRA HEAD WITHOUT CONTRAST TECHNIQUE: Multiplanar, multiecho pulse sequences of the brain and surrounding structures were obtained without intravenous contrast. Angiographic images of the head were obtained using MRA technique without contrast. COMPARISON:  Head CT 07/20/2019 and CTA 07/19/2019 FINDINGS: MRI HEAD FINDINGS The study is mildly motion degraded. Brain: A large acute infarct is again noted in the left MCA superior division involving the lateral and anterior aspects of the frontal lobe and insula with cytotoxic edema, minimal petechial hemorrhage, and unchanged trace rightward midline shift. Scattered punctate acute infarcts are also present in the left parietal and superior occipital lobes. Susceptibility artifact in the  left sylvian fissure and FLAIR signal within multiple sulci of the posterior left greater than right cerebral hemispheres is compatible with previously demonstrated small volume subarachnoid hemorrhage. Several scattered chronic microhemorrhages are noted peripherally in both cerebral hemispheres. There is no extra-axial fluid collection. Vascular: Major intracranial vascular flow voids are preserved. Skull and upper cervical spine: Unremarkable bone marrow signal. Sinuses/Orbits: Unremarkable orbits. Moderate left mastoid effusion. Clear paranasal sinuses. Other: None. MRA HEAD  FINDINGS The visualized distal vertebral arteries are widely patent to the basilar and codominant. Patent AICAs and SCA is are seen bilaterally. The basilar artery is widely patent. There are large posterior communicating arteries bilaterally with hypoplastic right and absent left P1 segments. Both PCAs are patent without evidence of significant proximal stenosis. The internal carotid arteries are widely patent from skull base to carotid termini with ectasias of both cavernous segments. ACAs and MCAs are patent without evidence of significant A1 or M1 stenosis. The proximal left M2 superior division remains patent following revascularization, however there is a severe stenosis near/slightly distal to the site of previous occlusion. There is also asymmetric attenuation of more distal branch vessels in the left MCA superior division. No aneurysm is identified. IMPRESSION: 1. 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 2. Punctate acute left parietal and occipital infarcts. 3. Residual small volume subarachnoid hemorrhage. 4. 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 Electronically Signed   By: Logan Bores M.D.   On: 07/22/2019 18:25   MR BRAIN WO CONTRAST  Result Date: 07/22/2019 CLINICAL DATA:  Stroke follow-up.  Proximal left M2 occlusion status post revascularization. EXAM: MRI HEAD WITHOUT CONTRAST MRA HEAD WITHOUT CONTRAST TECHNIQUE: Multiplanar, multiecho pulse sequences of the brain and surrounding structures were obtained without intravenous contrast. Angiographic images of the head were obtained using MRA technique without contrast. COMPARISON:  Head CT 07/20/2019 and CTA 07/19/2019 FINDINGS: MRI HEAD FINDINGS The study is mildly motion degraded. Brain: A large acute infarct is again noted in the left MCA superior division involving the lateral and anterior aspects of the frontal lobe and insula with cytotoxic edema, minimal petechial hemorrhage, and unchanged trace rightward midline shift. Scattered punctate acute infarcts are also present in the left parietal and superior occipital lobes. Susceptibility artifact in the left sylvian fissure and FLAIR signal within multiple sulci of the posterior left greater than right cerebral hemispheres is compatible with previously demonstrated small volume subarachnoid hemorrhage. Several scattered chronic microhemorrhages are noted peripherally in both cerebral hemispheres. There is no extra-axial fluid collection. Vascular: Major intracranial vascular flow voids are preserved. Skull and upper cervical spine: Unremarkable bone marrow signal. Sinuses/Orbits: Unremarkable orbits. Moderate left mastoid effusion. Clear paranasal sinuses. Other: None. MRA HEAD FINDINGS The visualized distal vertebral arteries are widely patent to the basilar and codominant. Patent AICAs and SCA is are seen bilaterally. The basilar artery is widely patent. There are large posterior communicating arteries bilaterally with hypoplastic right and absent left P1 segments. Both PCAs are patent without evidence of significant proximal stenosis. The internal carotid arteries are widely patent from skull base to carotid termini with ectasias of both cavernous segments. ACAs and MCAs are patent without  evidence of significant A1 or M1 stenosis. The proximal left M2 superior division remains patent following revascularization, however there is a severe stenosis near/slightly distal to the site of previous occlusion. There is also asymmetric attenuation of more distal branch vessels in the left MCA superior division. No aneurysm is identified. IMPRESSION: 1. 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 2. Punctate acute left parietal and occipital infarcts. 3. Residual small volume subarachnoid hemorrhage. 4. 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 Electronically Signed   By: Logan Bores M.D.   On: 07/22/2019 18:25    Cardiac Studies   07/20/19 TTE  IMPRESSIONS 1. Left ventricular ejection fraction, by estimation, is 60 to 65%. The  left ventricle has normal function. The left ventricle has no regional  wall motion abnormalities. Left ventricular diastolic parameters were  normal.  2. Right ventricular systolic function is normal. The right ventricular  size is normal. Tricuspid regurgitation signal is inadequate for assessing  PA pressure.  3. The mitral valve is normal in structure and function. Mild mitral  valve regurgitation. No evidence of mitral stenosis.  4. The aortic valve is normal in structure and function. Aortic valve  regurgitation is not visualized. No aortic stenosis is present.  5. The inferior vena cava is normal in size with greater than 50%  respiratory variability, suggesting right atrial pressure of 3 mmHg.     Patient Profile     74 y.o. female w/PMHx of HTN, HLD, hypothyroidism, admitted with stroke, found to have Afib/flutter new for her   Stroke -> TPA -> intubation. [place] TPA infusion CTA head showed M2 occlusion. Thrombectomy and revascularization complicated by SAH (moderate left sylvian SAH).   Assessment & Plan    1. Atrial  fib and flutter     CVR >> SR yesterday late AM, maintaining SR sine then (3sec post conversion pause)     SR 60's-70's, nocturnal SB 50's     No significant bradycardia in the last 24 + hours noted  On amiodarone 200mg  BID Anticoagulate when cleared to by neurology not felt to need pacing at this juncture  EP will sign off though remain available, please recall if needed   For questions or updates, please contact [place] Please consult [email or web address] for contact info under    Signed, Baldwin [place], PA-C  07/24/2019, 8:49 AM    EP attending  Patient seen and examined.  Discussed the situation with the patient's son who is here from [place].  The patient is extubated and doing quite well today.  She is status post stroke complicated by subarachnoid hemorrhage.  Post procedure she had atrial fibrillation and flutter with a rapid ventricular response and initially fairly long posttermination pauses.  She is maintaining sinus rhythm presently.  Prior to this, she had far fewer pauses despite being on amiodarone.  She has undergone swallowing study and apparently passed this.  She will be transition to oral intake and her amiodarone can be switched to 200 mg by mouth twice daily.  The patient's pauses appear to be much improved.  I would anticipate continuing amiodarone initially 200 twice daily for a month and then decreasing her dose this time goes by.  We will be available to see the patient in follow-up.  She will undergo initiation of systemic anticoagulation when okay with doing so by the neurologist.  Crissie Sickles, MD

## 2019-07-24 NOTE — Progress Notes (Signed)
Patient off unit for testing

## 2019-07-24 NOTE — Progress Notes (Signed)
Occupational Therapy Evaluation Patient Details Name: Jamie Mitchell MRN: [id number] DOB: 01-24-1946 Today's Date: 07/24/2019    History of Present Illness 74 yo F who presented with right sided weakness found to have acute left M2/ MCA occlusion s/p tPA and emergent thrombectomy which was complicated by moderate left sylvian SAH.  On 2/25 patient with Afib/ Aflutter with RVR and post conversion pauses. VDRF and was extubated am of 2/27.     Clinical Impression   Son states his mother recently came from [place] 1 week before she had her stroke. She was independent with ADL and mobility and enjoyed cooking and going to the park. Pt requires Min A for mobility and mod A for LB ADL due to deficits listed below. Recommend CIR for rehab. Will follow acutely. Son present for session. [employer] Interpreter sued during session.     Follow Up Recommendations  CIR;Supervision/Assistance - 24 hour    Equipment Recommendations  3 in 1 bedside commode    Recommendations for Other Services Rehab consult     Precautions / Restrictions Precautions Precautions: Fall Restrictions Weight Bearing Restrictions: No      Mobility Bed Mobility Overal bed mobility: Needs Assistance Bed Mobility: Supine to Sit     Supine to sit: Min assist        Transfers Overall transfer level: Needs assistance   Transfers: Sit to/from Stand Sit to Stand: Min assist              Balance Overall balance assessment: Needs assistance   Sitting balance-Leahy Scale: Fair       Standing balance-Leahy Scale: Fair                             ADL either performed or assessed with clinical judgement   ADL Overall ADL's : Needs assistance/impaired Eating/Feeding: Minimal assistance Eating/Feeding Details (indicate cue type and reason): spillage R side of mouth Grooming: Minimal assistance   Upper Body Bathing: Minimal assistance;Sitting   Lower Body Bathing: Moderate assistance;Sit  to/from stand   Upper Body Dressing : Moderate assistance;Sitting   Lower Body Dressing: Moderate assistance;Sit to/from stand   Toilet Transfer: Minimal assistance;Ambulation   Toileting- Clothing Manipulation and Hygiene: Minimal assistance;Sit to/from stand       Functional mobility during ADLs: Minimal assistance;Cueing for safety       Vision Baseline Vision/History: Wears glasses Additional Comments: will further assess; decreased visual attention     Perception Perception Comments: will further assess   Praxis Praxis Praxis tested?: Deficits Praxis-Other Comments: will further assess    Pertinent Vitals/Pain Pain Assessment: Faces Faces Pain Scale: Hurts a little bit Pain Location: headache Pain Descriptors / Indicators: Discomfort Pain Intervention(s): Limited activity within patient's tolerance     Hand Dominance Right   Extremity/Trunk Assessment Upper Extremity Assessment Upper Extremity Assessment: RUE deficits/detail RUE Deficits / Details: AROM WFL; grossly 3+/5 throught. Decreased in hand manipulation skills and hand wekaness; Ableto brush teeth using R hand RUE Coordination: decreased fine motor;decreased gross motor   Lower Extremity Assessment Lower Extremity Assessment: Defer to PT evaluation   Cervical / Trunk Assessment Cervical / Trunk Assessment: Other exceptions(R bias when distracted)   Communication Communication Communication: Prefers language other than English   Cognition Arousal/Alertness: Awake/alert Behavior During Therapy: Flat affect Overall Cognitive Status: Impaired/Different from baseline Area of Impairment: Attention;Following commands;Safety/judgement;Awareness;Problem solving  Current Attention Level: Sustained   Following Commands: Follows one step commands inconsistently;Follows one step commands with increased time Safety/Judgement: Decreased awareness of safety;Decreased awareness of  deficits Awareness: Emergent Problem Solving: Slow processing;Difficulty sequencing     General Comments       Exercises     Shoulder Instructions      Home Living Family/patient expects to be discharged to:: Private residence Living Arrangements: Alone Available Help at Discharge: Family;Available 24 hours/day Type of Home: Mobile home Home Access: Stairs to enter Entrance Stairs-Number of Steps: 7 Entrance Stairs-Rails: Right;Left;Can reach both Home Layout: One level     Bathroom Shower/Tub: Teacher, early years/pre: Standard Bathroom Accessibility: Yes How Accessible: Accessible via walker Home Equipment: None          Prior Functioning/Environment Level of Independence: Independent        Comments: 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, 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;  just came from [place] 1 week prior to CVA        OT Problem List: Decreased strength;Decreased range of motion;Decreased activity tolerance;Impaired balance (sitting and/or standing);Decreased cognition;Decreased safety awareness;Decreased knowledge of use of DME or AE;Decreased knowledge of precautions;Impaired UE functional use;Pain      OT Treatment/Interventions: Self-care/ADL training;Therapeutic exercise;Neuromuscular education;DME and/or AE instruction;Therapeutic activities;Cognitive remediation/compensation;Visual/perceptual remediation/compensation;Patient/family education;Balance training    OT Goals(Current goals can be found in the care plan section) Acute Rehab OT Goals Patient Stated Goal: Son is to take his mother home OT Goal Formulation: With patient/family Time For Goal Achievement: 08/07/19 Potential to Achieve Goals: Good  OT Frequency: Min 2X/week   Barriers to D/C:            Co-evaluation              AM-PAC OT "6 Clicks" Daily Activity     Outcome Measure Help from another person eating meals?: A Little Help from another person taking care of personal grooming?: A  Little Help from another person toileting, which includes using toliet, bedpan, or urinal?: A Little Help from another person bathing (including washing, rinsing, drying)?: A Lot Help from another person to put on and taking off regular upper body clothing?: A Little Help from another person to put on and taking off regular lower body clothing?: A Lot 6 Click Score: 16   End of Session Equipment Utilized During Treatment: Gait belt Nurse Communication: Mobility status  Activity Tolerance: Patient tolerated treatment well Patient left: in chair;with call bell/phone within reach;with family/visitor present;with chair alarm set  OT Visit Diagnosis: Other abnormalities of gait and mobility (R26.89);Muscle weakness (generalized) (M62.81);Other symptoms and signs involving cognitive function;Pain Pain - part of body: (headache)                Time: 1330-1350 OT Time Calculation (min): 20 min Charges:  OT General Charges $OT Visit: 1 Visit OT Evaluation $OT Eval Moderate Complexity: [name], OT/L   Acute OT Clinical Specialist Acute Rehabilitation Services Pager [phone] Office [phone]  07/24/2019, 2:36 PM

## 2019-07-24 NOTE — Consult Note (Signed)
Physical Medicine and Rehabilitation Consult Reason for Consult: Right side weakness Referring Physician: Dr. Leonie Man   HPI: Jamie Mitchell is a 74 y.o. limited English speaking right-handed female with history of hypertension as well as hypothyroidism.  Per chart review patient lives alone in [place] was here visiting family in the area for the last 6 months with her son.  Mobile home 7 steps to entry.  Presented 07/19/2019 with right-sided weakness.  Cranial CT scan showed subtle loss of gray-white matter differentiation involving the left frontal operculum concerning for acute left MCA territory infarction.  Small chronic high left frontal infarction.  Patient did receive TPA.  CT angiogram of head and neck positive for emergent large vessel occlusion with occlusive thrombus within proximal left M2 branch superior division.  Patient underwent revascularization per interventional radiology remain intubated for a short time extubated 07/23/2019.  Echocardiogram with ejection fraction 65% without emboli.  Cardiology follow-up for bouts of atrial fibrillation and flutter and currently maintained on amiodarone.  Dysphagia #1 thin liquid diet.  Nasogastric tube have been in place for nutritional support.  Therapy evaluations completed with recommendations of physical medicine rehab consult.   Review of Systems  Unable to perform ROS: Language   Past Medical History:  Diagnosis Date  . Hypertension   . Thyroid disease    Past Surgical History:  Procedure Laterality Date  . IR CT HEAD LTD  07/20/2019  . IR PERCUTANEOUS ART THROMBECTOMY/INFUSION INTRACRANIAL INC DIAG ANGIO  07/20/2019  . RADIOLOGY WITH ANESTHESIA N/A 07/20/2019   Procedure: IR WITH ANESTHESIA;  Surgeon: Luanne Bras, MD;  Location: [facility];  Service: Radiology;  Laterality: N/A;   Family History  Family history unknown: Yes   Social History:  reports that she has never smoked. She has never used smokeless tobacco.  She reports that she does not drink alcohol or use drugs.   Allergies: No Known Allergies   Medications Prior to Admission  Medication Sig Dispense Refill  . amLODipine (NORVASC) 5 MG tablet Take 5 mg by mouth daily.    . ASPIRIN PO Take 150 mg by mouth at bedtime.    [name] atorvastatin (LIPITOR) 20 MG tablet Take 20 mg by mouth daily.    [name] levothyroxine (SYNTHROID) 100 MCG tablet Take 50-75 mcg by mouth See admin instructions. 49mcg one day, 42mcg the next day, then repeat.    . pentoxifylline (TRENTAL) 400 MG CR tablet Take 400 mg by mouth daily.      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 Pt needed cues and assist to be aware of right UE.  Question if pt has inattention vs neglect of right hemibody.  Transfers Overall transfer level: Needs assistance Equipment used: Rolling walker (2 wheeled) Transfers: Sit to/from Stand Sit to Stand: Min assist, Mod assist General transfer comment: Cues for  hand placement and assist to power up. Needed assist to place right hand on RW and to keep it in place.  Ambulation/Gait Ambulation/Gait assistance: Min assist, +2 safety/equipment, Mod assist Gait Distance (Feet): 45  Feet Assistive device: Rolling walker (2 wheeled) Gait Pattern/deviations: Step-through pattern, Decreased stride length, Drifts right/left, Ataxic General Gait Details: Pt was able to walk to door and back needing assit to keep right hand on RW, to steer RW and for step sequencing.  Overall fairly steady with some incoordination of right LE as well.   Gait velocity interpretation: <1.31 ft/sec, indicative of household ambulator    ADL:    Cognition: Cognition Overall Cognitive Status: Impaired/Different from baseline Orientation Level: Oriented to person Cognition Arousal/Alertness: Awake/alert Behavior During Therapy: Flat affect Overall Cognitive Status: Impaired/Different from baseline Area of Impairment: Following commands, Safety/judgement, Problem solving Following Commands: Follows one step commands inconsistently, Follows one step commands with increased time Safety/Judgement: Decreased awareness of safety, Decreased awareness of deficits Problem Solving: Slow processing, Decreased initiation, Difficulty sequencing, Requires verbal cues, Requires tactile cues General Comments: Pt with expressive aphasia and questionable receptive aphasia as well therefore difficult to determine cognition due to this and language barrier.   Blood pressure 112/79, pulse 61, temperature 99.6 F (37.6 C), temperature source Oral, resp. rate 18, height 5\' 2"  (1.575 m), weight 71 kg, SpO2 98 %.   Physical Exam  General: No apparent distress, lethargic.  HEENT: Head is normocephalic, atraumatic, PERRLA, EOMI, sclera anicteric, oral mucosa pink and moist, dentition intact, ext ear canals clear  Neck: Supple without JVD or lymphadenopathy Heart: Reg rate and rhythm. No murmurs rubs or gallops Chest: CTA bilaterally without wheezes, rales, or rhonchi; no distress Abdomen: Soft, non-tender, non-distended, bowel sounds positive. Extremities: No clubbing, cyanosis, or edema. Pulses are 2+ Skin: Clean  and intact without signs of breakdown Neurological:  Alert no acute distress. Oriented x1 (self only). Left gaze preference. R facial droop. Globally aphasic. Follows demonstrated commands.  Exam limited by language barrier . 4/5 diffusely.  Psych: Pt's affect is appropriate. Pt is cooperative, lethargic but easily arousable.    Results for orders placed or performed during the hospital encounter of 07/19/19 (from the past 24 hour(s))  Glucose, capillary     Status: Abnormal   Collection Time: 07/23/19 11:46 AM  Result Value Ref Range   Glucose-Capillary 123 (H) 70 - 99 mg/dL   Comment 1 Notify RN    Comment 2 Document in Chart   Glucose, capillary     Status: Abnormal   Collection Time: 07/23/19  4:13 PM  Result Value Ref Range   Glucose-Capillary 136 (H) 70 - 99 mg/dL  Glucose, capillary     Status: Abnormal   Collection Time: 07/23/19  8:06 PM  Result Value Ref Range   Glucose-Capillary 133 (H) 70 - 99 mg/dL   Comment 1 Notify RN    Comment 2 Document in Chart   Glucose, capillary     Status: Abnormal   Collection Time: 07/23/19 11:46 PM  Result Value Ref Range   Glucose-Capillary 131 (H) 70 - 99 mg/dL   Comment 1 Notify RN    Comment 2 Document in Chart   Glucose, capillary     Status: Abnormal   Collection Time: 07/24/19  4:08 AM  Result Value Ref Range   Glucose-Capillary 128 (H) 70 - 99 mg/dL   Comment 1 Notify RN    Comment 2 Document in Chart   CBC     Status:  None   Collection Time: 07/24/19  5:29 AM  Result Value Ref Range   WBC 6.1 4.0 - 10.5 K/uL   RBC 4.09 3.87 - 5.11 MIL/uL   Hemoglobin 12.1 12.0 - 15.0 g/dL   HCT 37.4 36.0 - 46.0 %   MCV 91.4 80.0 - 100.0 fL   MCH 29.6 26.0 - 34.0 pg   MCHC 32.4 30.0 - 36.0 g/dL   RDW 13.5 11.5 - 15.5 %   Platelets 192 150 - 400 K/uL   nRBC 0.0 0.0 - 0.2 %  Basic metabolic panel     Status: Abnormal   Collection Time: 07/24/19  5:29 AM  Result Value Ref Range   Sodium 140 135 - 145 mmol/L   Potassium 3.6 3.5 - 5.1  mmol/L   Chloride 101 98 - 111 mmol/L   CO2 29 22 - 32 mmol/L   Glucose, Bld 135 (H) 70 - 99 mg/dL   BUN 24 (H) 8 - 23 mg/dL   Creatinine, Ser 0.86 0.44 - 1.00 mg/dL   Calcium 8.4 (L) 8.9 - 10.3 mg/dL   GFR calc non Af Amer >60 >60 mL/min   GFR calc Af Amer >60 >60 mL/min   Anion gap 10 5 - 15  Glucose, capillary     Status: Abnormal   Collection Time: 07/24/19  9:01 AM  Result Value Ref Range   Glucose-Capillary 156 (H) 70 - 99 mg/dL   CT HEAD WO CONTRAST  Result Date: 07/24/2019 CLINICAL DATA:  Stroke, follow-up. Revascularization of left M2 division. EXAM: CT HEAD WITHOUT CONTRAST TECHNIQUE: Contiguous axial images were obtained from the base of the skull through the vertex without intravenous contrast. COMPARISON:  CT head without contrast 07/20/2019. MR head without contrast 07/22/2019 FINDINGS: Brain: Left frontal operculum nonhemorrhagic infarct is stable. Punctate infarcts of the left parietal lobe are not appreciated by CT. Trace blood in the left sylvian fissure is resolving. Mass effect with 3 mm midline shift is noted. Ventricles are otherwise normal. No new infarcts are present. The brainstem and cerebellum are normal. Scattered parenchymal calcifications are again seen. Vascular: No hyperdense vessel or unexpected calcification. Skull: No significant extracranial soft tissue lesion is present. Calvarium is intact. No focal lytic or blastic lesions are present. Sinuses/Orbits: Chronic left middle ear and mastoid effusion is present. Paranasal sinuses and right mastoid air cells are clear. The globes and orbits are within normal limits. IMPRESSION: 1. Stable left frontal operculum nonhemorrhagic infarct. 2. Resolving subarachnoid blood in the left Sylvian fissure. 3. No new infarcts. 4. Chronic left middle ear and mastoid effusion. Electronically Signed   By: San Morelle M.D.   On: 07/24/2019 06:27   MR ANGIO HEAD WO CONTRAST  Result Date: 07/22/2019 CLINICAL DATA:  Stroke  follow-up. Proximal left M2 occlusion status post revascularization. EXAM: MRI HEAD WITHOUT CONTRAST MRA HEAD WITHOUT CONTRAST TECHNIQUE: Multiplanar, multiecho pulse sequences of the brain and surrounding structures were obtained without intravenous contrast. Angiographic images of the head were obtained using MRA technique without contrast. COMPARISON:  Head CT 07/20/2019 and CTA 07/19/2019 FINDINGS: MRI HEAD FINDINGS The study is mildly motion degraded. Brain: A large acute infarct is again noted in the left MCA superior division involving the lateral and anterior aspects of the frontal lobe and insula with cytotoxic edema, minimal petechial hemorrhage, and unchanged trace rightward midline shift. Scattered punctate acute infarcts are also present in the left parietal and superior occipital lobes. Susceptibility artifact in the left sylvian fissure and FLAIR signal within  multiple sulci of the posterior left greater than right cerebral hemispheres is compatible with previously demonstrated small volume subarachnoid hemorrhage. Several scattered chronic microhemorrhages are noted peripherally in both cerebral hemispheres. There is no extra-axial fluid collection. Vascular: Major intracranial vascular flow voids are preserved. Skull and upper cervical spine: Unremarkable bone marrow signal. Sinuses/Orbits: Unremarkable orbits. Moderate left mastoid effusion. Clear paranasal sinuses. Other: None. MRA HEAD FINDINGS The visualized distal vertebral arteries are widely patent to the basilar and codominant. Patent AICAs and SCA is are seen bilaterally. The basilar artery is widely patent. There are large posterior communicating arteries bilaterally with hypoplastic right and absent left P1 segments. Both PCAs are patent without evidence of significant proximal stenosis. The internal carotid arteries are widely patent from skull base to carotid termini with ectasias of both cavernous segments. ACAs and MCAs are patent  without evidence of significant A1 or M1 stenosis. The proximal left M2 superior division remains patent following revascularization, however there is a severe stenosis near/slightly distal to the site of previous occlusion. There is also asymmetric attenuation of more distal branch vessels in the left MCA superior division. No aneurysm is identified. IMPRESSION: 1. 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 2. Punctate acute left parietal and occipital infarcts. 3. Residual small volume subarachnoid hemorrhage. 4. 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 Electronically Signed   By: Logan Bores M.D.   On: 07/22/2019 18:25   MR BRAIN WO CONTRAST  Result Date: 07/22/2019 CLINICAL DATA:  Stroke follow-up. Proximal left M2 occlusion status post revascularization. EXAM: MRI HEAD WITHOUT CONTRAST MRA HEAD WITHOUT CONTRAST TECHNIQUE: Multiplanar, multiecho pulse sequences of the brain and surrounding structures were obtained without intravenous contrast. Angiographic images of the head were obtained using MRA technique without contrast. COMPARISON:  Head CT 07/20/2019 and CTA 07/19/2019 FINDINGS: MRI HEAD FINDINGS The study is mildly motion degraded. Brain: A large acute infarct is again noted in the left MCA superior division involving the lateral and anterior aspects of the frontal lobe and insula with cytotoxic edema, minimal petechial hemorrhage, and unchanged trace rightward midline shift. Scattered punctate acute infarcts are also present in the left parietal and superior occipital lobes. Susceptibility artifact in the left sylvian fissure and FLAIR signal within multiple sulci of the posterior left greater than right cerebral hemispheres is compatible with previously demonstrated small volume subarachnoid hemorrhage. Several scattered chronic microhemorrhages are noted peripherally in  both cerebral hemispheres. There is no extra-axial fluid collection. Vascular: Major intracranial vascular flow voids are preserved. Skull and upper cervical spine: Unremarkable bone marrow signal. Sinuses/Orbits: Unremarkable orbits. Moderate left mastoid effusion. Clear paranasal sinuses. Other: None. MRA HEAD FINDINGS The visualized distal vertebral arteries are widely patent to the basilar and codominant. Patent AICAs and SCA is are seen bilaterally. The basilar artery is widely patent. There are large posterior communicating arteries bilaterally with hypoplastic right and absent left P1 segments. Both PCAs are patent without evidence of significant proximal stenosis. The internal carotid arteries are widely patent from skull base to carotid termini with ectasias of both cavernous segments. ACAs and MCAs are patent without evidence of significant A1 or M1 stenosis. The proximal left M2 superior division remains patent following revascularization, however there is a severe stenosis near/slightly distal to the site of previous occlusion. There is also asymmetric attenuation of more distal branch vessels in the left MCA superior division. No aneurysm is identified. IMPRESSION: 1.  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 2. Punctate acute left parietal and occipital infarcts. 3. Residual small volume subarachnoid hemorrhage. 4. 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 Electronically Signed   By: Logan Bores M.D.   On: 07/22/2019 18:25   DG Swallowing Func-Speech Pathology  Result Date: 07/24/2019 Objective Swallowing Evaluation: Type of Study: MBS-Modified Barium Swallow Study  Patient Details Name: Avona Conlin MRN: [id number] Date of Birth: 04/01/1946 Today's Date: 07/24/2019 Time: SLP Start Time (ACUTE ONLY): 0810 -SLP Stop Time (ACUTE ONLY): 0830 SLP Time Calculation (min)  (ACUTE ONLY): 20 min Past Medical History: Past Medical History: Diagnosis Date . Hypertension  . Thyroid disease  Past Surgical History: Past Surgical History: Procedure Laterality Date . IR CT HEAD LTD  07/20/2019 . IR PERCUTANEOUS ART THROMBECTOMY/INFUSION INTRACRANIAL INC DIAG ANGIO  07/20/2019 . RADIOLOGY WITH ANESTHESIA N/A 07/20/2019  Procedure: IR WITH ANESTHESIA;  Surgeon: Luanne Bras, MD;  Location: [facility];  Service: Radiology;  Laterality: N/A; HPI: 74 yo F who presented with right sided weakness found to have acute left M2/ MCA occlusion s/p tPA and emergent thrombectomy which was complicated by moderate left sylvian SAH.  On 2/25 patient with Afib/ Aflutter with RVR and post conversion pauses. VDRF and was extubated am of 2/27.   No data recorded Assessment / Plan / Recommendation CHL IP CLINICAL IMPRESSIONS 07/24/2019 Clinical Impression Pt demonstrates a primary oral dysphagia with slow volitional initaition of swallow activities.  Paticular difficulty initiating and coordinating mastication, which was slow, but complete with mild right buccal residue.  Pt has mild right labial weakness with anterior spillage with large quantitiy rapid self feeding. She is able to sip from a straw, and ultimately this was most effective, with assist, to control bolus size and rate. When self feeding pt will often take multiple sips with complete oral cohesion of the bolus before triggered posterior transit and swallow response. There is therefore typically piecemeal transit of a very large bolus. Pharyngeal phase generally is WNL except for some mild oral/base of tongue transit that accumulates in the valleculae. Pt only experienced one instance of sensed aspiration when allowed to very impulsively take independent cup sips, throwing head back and resulting in an instance of premature spillage. Cough response immediate, hard and effective. Will initiate a dys 1 (puree) diet with thin liquids with good potential for  upgraded solids with therapy. Suspect automaticity will improve with intake.  SLP Visit Diagnosis Dysphagia, oropharyngeal phase (R13.12) Attention and concentration deficit following -- Frontal lobe and executive function deficit following -- Impact on safety and function --   CHL IP TREATMENT RECOMMENDATION 07/24/2019 Treatment Recommendations Therapy as outlined in treatment plan below   Prognosis 07/24/2019 Prognosis for Safe Diet Advancement Good Barriers to Reach Goals Language deficits Barriers/Prognosis Comment -- CHL IP DIET RECOMMENDATION 07/24/2019 SLP Diet Recommendations Dysphagia 1 (Puree) solids;Thin liquid Liquid Administration via Straw Medication Administration Crushed with puree Compensations Slow rate;Small sips/bites Postural Changes Seated upright at 90 degrees   CHL IP OTHER RECOMMENDATIONS 07/24/2019 Recommended Consults -- Oral Care Recommendations Oral care BID Other Recommendations --   CHL IP FOLLOW UP RECOMMENDATIONS 07/24/2019 Follow up Recommendations Inpatient Rehab   CHL IP FREQUENCY AND DURATION 07/24/2019 Speech Therapy Frequency (ACUTE ONLY) min 2x/week Treatment Duration 2 weeks      CHL IP ORAL PHASE 07/24/2019 Oral Phase Impaired Oral - Pudding Teaspoon -- Oral - Pudding  Cup -- Oral - Honey Teaspoon -- Oral - Honey Cup -- Oral - Nectar Teaspoon Delayed oral transit;Piecemeal swallowing;Lingual/palatal residue Oral - Nectar Cup Delayed oral transit;Piecemeal swallowing;Lingual/palatal residue;Right anterior bolus loss Oral - Nectar Straw -- Oral - Thin Teaspoon -- Oral - Thin Cup Delayed oral transit;Piecemeal swallowing;Lingual/palatal residue Oral - Thin Straw Delayed oral transit;Piecemeal swallowing;Lingual/palatal residue Oral - Puree Delayed oral transit;Piecemeal swallowing;Lingual/palatal residue Oral - Mech Soft Delayed oral transit;Piecemeal swallowing;Lingual/palatal residue;Right pocketing in lateral sulci;Impaired mastication Oral - Regular -- Oral - Multi-Consistency -- Oral -  Pill -- Oral Phase - Comment --  CHL IP PHARYNGEAL PHASE 07/24/2019 Pharyngeal Phase Impaired Pharyngeal- Pudding Teaspoon -- Pharyngeal -- Pharyngeal- Pudding Cup -- Pharyngeal -- Pharyngeal- Honey Teaspoon -- Pharyngeal -- Pharyngeal- Honey Cup -- Pharyngeal -- Pharyngeal- Nectar Teaspoon WFL Pharyngeal -- Pharyngeal- Nectar Cup WFL Pharyngeal -- Pharyngeal- Nectar Straw -- Pharyngeal -- Pharyngeal- Thin Teaspoon -- Pharyngeal -- Pharyngeal- Thin Cup Penetration/Aspiration before swallow;Pharyngeal residue - valleculae Pharyngeal Material enters airway, passes BELOW cords then ejected out;Material does not enter airway Pharyngeal- Thin Straw WFL Pharyngeal -- Pharyngeal- Puree Pharyngeal residue - valleculae;Reduced tongue base retraction Pharyngeal -- Pharyngeal- Mechanical Soft -- Pharyngeal -- Pharyngeal- Regular Pharyngeal residue - valleculae;Reduced tongue base retraction Pharyngeal -- Pharyngeal- Multi-consistency -- Pharyngeal -- Pharyngeal- Pill -- Pharyngeal -- Pharyngeal Comment --  No flowsheet data found. Herbie Baltimore, MA CCC-SLP Acute Rehabilitation Services Pager [phone] Office [phone] Lynann Beaver 07/24/2019, 9:03 AM                Assessment/Plan: Diagnosis: Impaired mobility and ADLs following L MCA infarct 1. Does the need for close, 24 hr/day medical supervision in concert with the patient's rehab needs make it unreasonable for this patient to be served in a less intensive setting? Yes 2. Co-Morbidities requiring supervision/potential complications: L frontal operculum MCA infarct, HTN, HLD, thyroid disease 3. Due to bladder management, bowel management, safety, skin/wound care, disease management, medication administration, pain management and patient education, does the patient require 24 hr/day rehab nursing? Yes 4. Does the patient require coordinated care of a physician, rehab nurse, therapy disciplines of PT,OT, SLP to address physical and functional deficits  in the context of the above medical diagnosis(es)? Yes Addressing deficits in the following areas: balance, endurance, locomotion, strength, transferring, bowel/bladder control, bathing, dressing, feeding, grooming, toileting, cognition, speech, language, swallowing and psychosocial support 5. Can the patient actively participate in an intensive therapy program of at least 3 hrs of therapy per day at least 5 days per week? Yes 6. The potential for patient to make measurable gains while on inpatient rehab is excellent 7. Anticipated functional outcomes upon discharge from inpatient rehab are min assist  with PT, min assist with OT, min assist with SLP. 8. Estimated rehab length of stay to reach the above functional goals is: 16-20 days 9. Anticipated discharge destination: Home 10. Overall Rehab/Functional Prognosis: excellent  RECOMMENDATIONS: This patient's condition is appropriate for continued rehabilitative care in the following setting: CIR Patient has agreed to participate in recommended program. Yes Note that insurance prior authorization may be required for reimbursement for recommended care.  Comment: Mrs. [name] would be an excellent CIR candidate. Her son at bedside is very supportive and his family will be caring for her. His wife does not work and will be with her 24/7.   Lavon Paganini Angiulli, PA-C 07/24/2019   I have personally performed a face to face diagnostic evaluation, including, but not limited to relevant history and physical exam  findings, of this patient and developed relevant assessment and plan.  Additionally, I have reviewed and concur with the physician assistant's documentation above.  Leeroy Cha, MD

## 2019-07-24 NOTE — Progress Notes (Signed)
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  On 2/25 patient with Afib/ Aflutter with RVR and post conversion pauses. VDRF 2/24-2/27.      PT Comments    Pt was able to walk a short distance in the hallway today with min two person assist for safety.  She fatigues quickly and tends to ignore obstacles on her right side.  Son is present and interpreting.  He is eager to actively participate in her care.  Plan to attempt gait without RW next session as it is difficult for her to manage and she may do better navigating (despite likely increase in assist level) if she doesn't have to move the walker around obstacles.  PT will continue to follow acutely for safe mobility progression.   Follow Up Recommendations  CIR;Supervision/Assistance - 24 hour     Equipment Recommendations  Rolling walker with 5" wheels;3in1 (PT)    Recommendations for Other Services   NA     Precautions / Restrictions Precautions Precautions: Fall Restrictions Weight Bearing Restrictions: No    Mobility  Bed Mobility Overal bed mobility: Needs Assistance Bed Mobility: Supine to Sit     Supine to sit: Min assist     General bed mobility comments: min assist to support trunk during transitions for balance.  Cues to scoot out to the edge of the bed once seated and not to go immediatly to standing.   Transfers Overall transfer level: Needs assistance Equipment used: Rolling walker (2 wheeled) Transfers: Sit to/from Stand Sit to Stand: Min assist         General transfer comment: Min assist to help power up, stabilize both hands on RW.  Hand over hand assist for R hand use on RW and for transitions sit/stand.    Ambulation/Gait Ambulation/Gait assistance: Min assist;+2 safety/equipment Gait Distance (Feet): 85 Feet Assistive device: Rolling walker (2 wheeled) Gait Pattern/deviations: Step-through pattern;Decreased step length - right Gait velocity: too fast to be safe, cues for slowing speed.    General Gait Details: Does not attend to obstacles on her right, difficult to get to look right and turn right in the hallway, so we practiced looking and turning right.  Fatigues quickly, so we did not make it far down the hallway.         Modified Rankin (Stroke Patients Only) Modified Rankin (Stroke Patients Only) Pre-Morbid Rankin Score: No symptoms Modified Rankin: Moderately severe disability     Balance Overall balance assessment: Needs assistance Sitting-balance support: Feet supported;Bilateral upper extremity supported Sitting balance-Leahy Scale: Fair Sitting balance - Comments: close supervision EOB   Standing balance support: Bilateral upper extremity supported Standing balance-Leahy Scale: Poor Standing balance comment: needs external assist in standing for balance                             Cognition Arousal/Alertness: Awake/alert Behavior During Therapy: Flat affect Overall Cognitive Status: Impaired/Different from baseline Area of Impairment: Attention;Following commands;Safety/judgement;Awareness;Problem solving                   Current Attention Level: Sustained   Following Commands: Follows one step commands with increased time;Follows one step commands inconsistently Safety/Judgement: Decreased awareness of  safety;Decreased awareness of deficits Awareness: Emergent Problem Solving: Slow processing;Difficulty sequencing General Comments: Pt with expressive aphasia and questionable receptive aphasia superimposed on non-English Speaking (Spanish).  Pt will follow a basic command, but has difficulty attending to her right side without multimodal  cues, cues and assist for safety with RW use, eating (which she was doing with son when we entered the room).       Exercises Other Exercises Other Exercises: issued yellow theraputty - grip and pinch strengthening Other Exercises: squeeze ball exercises        Pertinent Vitals/Pain Pain Assessment: Faces Faces Pain Scale: Hurts a little bit Pain Location: headache Pain Descriptors / Indicators: Discomfort Pain Intervention(s): Limited activity within patient's tolerance;Monitored during session;Repositioned    Home Living Family/patient expects to be discharged to:: Private residence Living Arrangements: Alone Available Help at Discharge: Family;Available 24 hours/day Type of Home: Mobile home Home Access: Stairs to enter Entrance Stairs-Rails: Right;Left;Can reach both Home Layout: One level Home Equipment: None      Prior Function Level of Independence: Independent      Comments: 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, 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;  just came from [place] 1 week prior to CVA   PT Goals (current goals can now be found in the care plan section) Acute Rehab PT Goals Patient Stated Goal: Son is to take his mother home Progress towards PT goals: Progressing toward goals    Frequency    Min 4X/week      PT Plan Current plan remains appropriate       AM-PAC PT "6 Clicks" Mobility   Outcome Measure  Help needed turning from your back to your side while in a flat bed without using bedrails?: A Little Help needed moving from lying on your back to sitting on the side of a flat bed without using bedrails?: A Little Help needed moving to and from a bed to a chair (including a wheelchair)?: A Little Help needed standing up from a chair using your arms (e.g., wheelchair or bedside chair)?: A Little Help needed to walk in hospital room?: A Little Help needed climbing 3-5 steps with a railing? : A Lot 6 Click Score: 17    End of Session Equipment Utilized During Treatment: Gait  belt Activity Tolerance: Patient limited by fatigue Patient left: in chair;with call bell/phone within reach;with chair alarm set;with family/visitor present Nurse Communication: Mobility status;Other (comment)(left pt on RA as she was sating in the 90s) PT Visit Diagnosis: Unsteadiness on feet (R26.81);Muscle weakness (generalized) (M62.81);Hemiplegia and hemiparesis Hemiplegia - Right/Left: Right Hemiplegia - dominant/non-dominant: Dominant Hemiplegia - caused by: Cerebral infarction     Time: [id number] PT Time Calculation (min) (ACUTE ONLY): 27 min  Charges:  $Gait Training: 8-22 mins $Therapeutic Activity: 8-22 mins          Verdene Lennert, PT, DPT  Acute Rehabilitation [phone] pager #[phone] office  @ [facility]: [phone]             07/24/2019, 3:35 PM

## 2019-07-24 NOTE — Progress Notes (Signed)
Referring Physician(s): Code Stroke- Kerney Elbe  Supervising Physician: Pedro Earls  Patient Status:  [facility] - In-pt  Chief Complaint: None- nonverbal  Subjective:  Acute CVA s/p cerebral arteriogram with emergent mechanical thrombectomy of left MCA M2 occlusion achieving a TICI 2C revascularization 07/20/2019 by Dr. Karenann Cai. Patient laying in bed resting comfortably. She is nonverbal but able to nod yes/no to questions. Follows simple commands. Can spontaneously move all extremities. Right groin incision c/d/i.  Interpretor [id number] was used throughout today's interaction.   Allergies: Patient has no known allergies.  Medications: Prior to Admission medications   Medication Sig Start Date End Date Taking? Authorizing Provider  amLODipine (NORVASC) 5 MG tablet Take 5 mg by mouth daily.   Yes [provider]  ASPIRIN PO Take 150 mg by mouth at bedtime.   Yes [provider]  atorvastatin (LIPITOR) 20 MG tablet Take 20 mg by mouth daily.   Yes [provider]  levothyroxine (SYNTHROID) 100 MCG tablet Take 50-75 mcg by mouth See admin instructions. 24mcg one day, 47mcg the next day, then repeat.   Yes [provider]  pentoxifylline (TRENTAL) 400 MG CR tablet Take 400 mg by mouth daily.   Yes [provider]     Vital Signs: BP 112/79 (BP Location: Right Arm)    Pulse 61    Temp 99.6 F (37.6 C) (Oral)    Resp 18    Ht 5\' 2"  (1.575 m)    Wt 156 lb 8.4 oz (71 kg)    SpO2 98%    BMI 28.63 kg/m   Physical Exam Vitals and nursing note reviewed.  Constitutional:      General: She is not in acute distress. Pulmonary:     Effort: Pulmonary effort is normal. No respiratory distress.  Skin:    General: Skin is warm and dry.     Comments: Right groin incision soft without active bleeding or hematoma.  Neurological:     Comments: Awake and alert. She is nonverbal but able to nod yes/no to questions,  follows simple commands. PERRL bilaterally. Can spontaneously move all extremities.     Imaging: CT HEAD WO CONTRAST  Result Date: 07/24/2019 CLINICAL DATA:  Stroke, follow-up. Revascularization of left M2 division. EXAM: CT HEAD WITHOUT CONTRAST TECHNIQUE: Contiguous axial images were obtained from the base of the skull through the vertex without intravenous contrast. COMPARISON:  CT head without contrast 07/20/2019. MR head without contrast 07/22/2019 FINDINGS: Brain: Left frontal operculum nonhemorrhagic infarct is stable. Punctate infarcts of the left parietal lobe are not appreciated by CT. Trace blood in the left sylvian fissure is resolving. Mass effect with 3 mm midline shift is noted. Ventricles are otherwise normal. No new infarcts are present. The brainstem and cerebellum are normal. Scattered parenchymal calcifications are again seen. Vascular: No hyperdense vessel or unexpected calcification. Skull: No significant extracranial soft tissue lesion is present. Calvarium is intact. No focal lytic or blastic lesions are present. Sinuses/Orbits: Chronic left middle ear and mastoid effusion is present. Paranasal sinuses and right mastoid air cells are clear. The globes and orbits are within normal limits. IMPRESSION: 1. Stable left frontal operculum nonhemorrhagic infarct. 2. Resolving subarachnoid blood in the left Sylvian fissure. 3. No new infarcts. 4. Chronic left middle ear and mastoid effusion. Electronically Signed   By: San Morelle M.D.   On: 07/24/2019 06:27   CT HEAD WO CONTRAST  Result Date: 07/20/2019 CLINICAL DATA:  Follow-up stroke EXAM: CT  HEAD WITHOUT CONTRAST TECHNIQUE: Contiguous axial images were obtained from the base of the skull through the vertex without intravenous contrast. COMPARISON:  Head CT earlier same day. FINDINGS: Brain: Acute infarction of the left frontal operculum, anterior insular and subinsular brain as shown previously. Low-density and swelling but no  evidence of further extension of the infarction. Small amount of subarachnoid hemorrhage at the base of the brain and within the sylvian fissure on the left is becoming slightly less dense. No evidence of additional or worsening hemorrhage. No significant mass effect. One or 2 mm of left-to-right midline shift at the most. No other area of acute or subacute infarction. 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 No hydrocephalus. No extra-axial collection. No intraparenchymal hemorrhage in the region of infarction. Vascular: No new primary vascular finding. Skull: Negative Sinuses/Orbits: Clear/normal Other: None IMPRESSION: Subarachnoid hemorrhage at the base of the brain and sylvian fissure on the left is becoming less dense. No evidence of additional bleeding. Well-circumscribed infarction in the left frontal operculum, insula and subinsular brain as seen previously. No evidence of infarct extension. Mild regional swelling with left-to-right shift of 1-2 mm. No infarct hematoma. Electronically Signed   By: Nelson Chimes M.D.   On: 07/20/2019 21:53   MR ANGIO HEAD WO CONTRAST  Result Date: 07/22/2019 CLINICAL DATA:  Stroke follow-up. Proximal left M2 occlusion status post revascularization. EXAM: MRI HEAD WITHOUT CONTRAST MRA HEAD WITHOUT CONTRAST TECHNIQUE: Multiplanar, multiecho pulse sequences of the brain and surrounding structures were obtained without intravenous contrast. Angiographic images of the head were obtained using MRA technique without contrast. COMPARISON:  Head CT 07/20/2019 and CTA 07/19/2019 FINDINGS: MRI HEAD FINDINGS The study is mildly motion degraded. Brain: A large acute infarct is again noted in the left MCA superior division involving the lateral and anterior aspects of the frontal lobe and insula with cytotoxic edema, minimal petechial hemorrhage, and unchanged trace rightward midline shift. Scattered punctate acute infarcts are  also present in the left parietal and superior occipital lobes. Susceptibility artifact in the left sylvian fissure and FLAIR signal within multiple sulci of the posterior left greater than right cerebral hemispheres is compatible with previously demonstrated small volume subarachnoid hemorrhage. Several scattered chronic microhemorrhages are noted peripherally in both cerebral hemispheres. There is no extra-axial fluid collection. Vascular: Major intracranial vascular flow voids are preserved. Skull and upper cervical spine: Unremarkable bone marrow signal. Sinuses/Orbits: Unremarkable orbits. Moderate left mastoid effusion. Clear paranasal sinuses. Other: None. MRA HEAD FINDINGS The visualized distal vertebral arteries are widely patent to the basilar and codominant. Patent AICAs and SCA is are seen bilaterally. The basilar artery is widely patent. There are large posterior communicating arteries bilaterally with hypoplastic right and absent left P1 segments. Both PCAs are patent without evidence of significant proximal stenosis. The internal carotid arteries are widely patent from skull base to carotid termini with ectasias of both cavernous segments. ACAs and MCAs are patent without evidence of significant A1 or M1 stenosis. The proximal left M2 superior division remains patent following revascularization, however there is a severe stenosis near/slightly distal to the site of previous occlusion. There is also asymmetric attenuation of more distal branch vessels in the left MCA superior division. No aneurysm is identified. IMPRESSION: 1. 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 2. Punctate acute left parietal and occipital infarcts. 3. Residual small volume subarachnoid hemorrhage. 4. Severe left MCA superior division branch vessel stenosis near the  site of previous occlusion. Electronically Signed   By: Logan Bores M.D.   On:  07/22/2019 18:25   MR BRAIN WO CONTRAST  Result Date: 07/22/2019 CLINICAL DATA:  Stroke follow-up. Proximal left M2 occlusion status post revascularization. EXAM: MRI HEAD WITHOUT CONTRAST MRA HEAD WITHOUT CONTRAST TECHNIQUE: Multiplanar, multiecho pulse sequences of the brain and surrounding structures were obtained without intravenous contrast. Angiographic images of the head were obtained using MRA technique without contrast. COMPARISON:  Head CT 07/20/2019 and CTA 07/19/2019 FINDINGS: MRI HEAD FINDINGS The study is mildly motion degraded. Brain: A large acute infarct is again noted in the left MCA superior division involving the lateral and anterior aspects of the frontal lobe and insula with cytotoxic edema, minimal petechial hemorrhage, and unchanged trace rightward midline shift. Scattered punctate acute infarcts are also present in the left parietal and superior occipital lobes. Susceptibility artifact in the left sylvian fissure and FLAIR signal within multiple sulci of the posterior left greater than right cerebral hemispheres is compatible with previously demonstrated small volume subarachnoid hemorrhage. Several scattered chronic microhemorrhages are noted peripherally in both cerebral hemispheres. There is no extra-axial fluid collection. Vascular: Major intracranial vascular flow voids are preserved. Skull and upper cervical spine: Unremarkable bone marrow signal. Sinuses/Orbits: Unremarkable orbits. Moderate left mastoid effusion. Clear paranasal sinuses. Other: None. MRA HEAD FINDINGS The visualized distal vertebral arteries are widely patent to the basilar and codominant. Patent AICAs and SCA is are seen bilaterally. The basilar artery is widely patent. There are large posterior communicating arteries bilaterally with hypoplastic right and absent left P1 segments. Both PCAs are patent without evidence of significant proximal stenosis. The internal carotid arteries are widely patent from skull  base to carotid termini with ectasias of both cavernous segments. ACAs and MCAs are patent without evidence of significant A1 or M1 stenosis. The proximal left M2 superior division remains patent following revascularization, however there is a severe stenosis near/slightly distal to the site of previous occlusion. There is also asymmetric attenuation of more distal branch vessels in the left MCA superior division. No aneurysm is identified. IMPRESSION: 1. 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 2. Punctate acute left parietal and occipital infarcts. 3. Residual small volume subarachnoid hemorrhage. 4. 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 Electronically Signed   By: Logan Bores M.D.   On: 07/22/2019 18:25   DG Chest Port 1 View  Result Date: 07/21/2019 CLINICAL DATA:  Respiratory failure. Stroke. EXAM: PORTABLE CHEST 1 VIEW COMPARISON:  One-view chest x-ray 07/19/2018 FINDINGS: Endotracheal tube is been pulled back, now terminating at the clavicles, well above the carina. Lung volumes remain low. Pulmonary vascular congestion is unchanged. IMPRESSION: 1. Repositioning of endotracheal tube now in satisfactory position. 2. Stable low lung volumes and moderate pulmonary vascular congestion. Electronically Signed   By: San Morelle M.D.   On: 07/21/2019 08:36   DG Swallowing Func-Speech Pathology  Result Date: 07/24/2019 Objective Swallowing Evaluation: Type of Study: MBS-Modified Barium Swallow Study  Patient Details Name: Chloye Spicuzza MRN: [id number] Date of Birth: 01/27/1946 Today's Date: 07/24/2019 Time: SLP Start Time (ACUTE ONLY): 0810 -SLP Stop Time (ACUTE ONLY): 0830 SLP Time Calculation (min) (ACUTE ONLY): 20 min Past Medical History: Past Medical History: Diagnosis Date  Hypertension   Thyroid disease  Past Surgical History: Past Surgical History:  Procedure Laterality Date  IR CT HEAD LTD  07/20/2019  IR PERCUTANEOUS ART THROMBECTOMY/INFUSION  INTRACRANIAL INC DIAG ANGIO  07/20/2019  RADIOLOGY WITH ANESTHESIA N/A 07/20/2019  Procedure: IR WITH ANESTHESIA;  Surgeon: Luanne Bras, MD;  Location: [facility];  Service: Radiology;  Laterality: N/A; HPI: 74 yo F who presented with right sided weakness found to have acute left M2/ MCA occlusion s/p tPA and emergent thrombectomy which was complicated by moderate left sylvian SAH.  On 2/25 patient with Afib/ Aflutter with RVR and post conversion pauses. VDRF and was extubated am of 2/27.   No data recorded Assessment / Plan / Recommendation CHL IP CLINICAL IMPRESSIONS 07/24/2019 Clinical Impression Pt demonstrates a primary oral dysphagia with slow volitional initaition of swallow activities.  Paticular difficulty initiating and coordinating mastication, which was slow, but complete with mild right buccal residue.  Pt has mild right labial weakness with anterior spillage with large quantitiy rapid self feeding. She is able to sip from a straw, and ultimately this was most effective, with assist, to control bolus size and rate. When self feeding pt will often take multiple sips with complete oral cohesion of the bolus before triggered posterior transit and swallow response. There is therefore typically piecemeal transit of a very large bolus. Pharyngeal phase generally is WNL except for some mild oral/base of tongue transit that accumulates in the valleculae. Pt only experienced one instance of sensed aspiration when allowed to very impulsively take independent cup sips, throwing head back and resulting in an instance of premature spillage. Cough response immediate, hard and effective. Will initiate a dys 1 (puree) diet with thin liquids with good potential for upgraded solids with therapy. Suspect automaticity will improve with intake.  SLP Visit Diagnosis Dysphagia, oropharyngeal phase (R13.12) Attention and  concentration deficit following -- Frontal lobe and executive function deficit following -- Impact on safety and function --   CHL IP TREATMENT RECOMMENDATION 07/24/2019 Treatment Recommendations Therapy as outlined in treatment plan below   Prognosis 07/24/2019 Prognosis for Safe Diet Advancement Good Barriers to Reach Goals Language deficits Barriers/Prognosis Comment -- CHL IP DIET RECOMMENDATION 07/24/2019 SLP Diet Recommendations Dysphagia 1 (Puree) solids;Thin liquid Liquid Administration via Straw Medication Administration Crushed with puree Compensations Slow rate;Small sips/bites Postural Changes Seated upright at 90 degrees   CHL IP OTHER RECOMMENDATIONS 07/24/2019 Recommended Consults -- Oral Care Recommendations Oral care BID Other Recommendations --   CHL IP FOLLOW UP RECOMMENDATIONS 07/24/2019 Follow up Recommendations Inpatient Rehab   CHL IP FREQUENCY AND DURATION 07/24/2019 Speech Therapy Frequency (ACUTE ONLY) min 2x/week Treatment Duration 2 weeks      CHL IP ORAL PHASE 07/24/2019 Oral Phase Impaired Oral - Pudding Teaspoon -- Oral - Pudding Cup -- Oral - Honey Teaspoon -- Oral - Honey Cup -- Oral - Nectar Teaspoon Delayed oral transit;Piecemeal swallowing;Lingual/palatal residue Oral - Nectar Cup Delayed oral transit;Piecemeal swallowing;Lingual/palatal residue;Right anterior bolus loss Oral - Nectar Straw -- Oral - Thin Teaspoon -- Oral - Thin Cup Delayed oral transit;Piecemeal swallowing;Lingual/palatal residue Oral - Thin Straw Delayed oral transit;Piecemeal swallowing;Lingual/palatal residue Oral - Puree Delayed oral transit;Piecemeal swallowing;Lingual/palatal residue Oral - Mech Soft Delayed oral transit;Piecemeal swallowing;Lingual/palatal residue;Right pocketing in lateral sulci;Impaired mastication Oral - Regular -- Oral - Multi-Consistency -- Oral - Pill -- Oral Phase - Comment --  CHL IP PHARYNGEAL PHASE 07/24/2019 Pharyngeal Phase Impaired Pharyngeal- Pudding Teaspoon -- Pharyngeal -- Pharyngeal-  Pudding Cup -- Pharyngeal -- Pharyngeal- Honey Teaspoon -- Pharyngeal -- Pharyngeal- Honey Cup -- Pharyngeal -- Pharyngeal- Nectar Teaspoon WFL Pharyngeal -- Pharyngeal- Nectar Cup WFL Pharyngeal -- Pharyngeal- Nectar Straw -- Pharyngeal -- Pharyngeal-  Thin Teaspoon -- Pharyngeal -- Pharyngeal- Thin Cup Penetration/Aspiration before swallow;Pharyngeal residue - valleculae Pharyngeal Material enters airway, passes BELOW cords then ejected out;Material does not enter airway Pharyngeal- Thin Straw WFL Pharyngeal -- Pharyngeal- Puree Pharyngeal residue - valleculae;Reduced tongue base retraction Pharyngeal -- Pharyngeal- Mechanical Soft -- Pharyngeal -- Pharyngeal- Regular Pharyngeal residue - valleculae;Reduced tongue base retraction Pharyngeal -- Pharyngeal- Multi-consistency -- Pharyngeal -- Pharyngeal- Pill -- Pharyngeal -- Pharyngeal Comment --  No flowsheet data found. Herbie Baltimore, MA CCC-SLP Acute Rehabilitation Services Pager [phone] Office [phone] Lynann Beaver 07/24/2019, 9:03 AM              ECHOCARDIOGRAM COMPLETE  Result Date: 07/20/2019    ECHOCARDIOGRAM REPORT   Patient Name:   Jamie Mitchell Date of Exam: 07/20/2019 Medical Rec #:  [id number]               Height:       62.0 in Accession #:    [id number]              Weight:       151.7 lb Date of Birth:  1946-04-29              BSA:          1.700 m Patient Age:    74 years                BP:           124/68 mmHg Patient Gender: F                       HR:           79 bpm. Exam Location:  Inpatient Procedure: 2D Echo, Cardiac Doppler and Color Doppler STAT ECHO Indications:    CVA  History:        Patient has no prior history of Echocardiogram examinations. PAD                 and Stroke, Arrythmias:Atrial Fibrillation and Tachy-Brady; Risk                 Factors:Hypertension and Dyslipidemia.  Sonographer:    Dustin Flock Referring Phys: [place]  1. Left ventricular ejection fraction, by  estimation, is 60 to 65%. The left ventricle has normal function. The left ventricle has no regional wall motion abnormalities. Left ventricular diastolic parameters were normal.  2. Right ventricular systolic function is normal. The right ventricular size is normal. Tricuspid regurgitation signal is inadequate for assessing PA pressure.  3. The mitral valve is normal in structure and function. Mild mitral valve regurgitation. No evidence of mitral stenosis.  4. The aortic valve is normal in structure and function. Aortic valve regurgitation is not visualized. No aortic stenosis is present.  5. The inferior vena cava is normal in size with greater than 50% respiratory variability, suggesting right atrial pressure of 3 mmHg. FINDINGS  Left Ventricle: Left ventricular ejection fraction, by estimation, is 60 to 65%. The left ventricle has normal function. The left ventricle has no regional wall motion abnormalities. The left ventricular internal cavity size was normal in size. There is  no left ventricular hypertrophy. Left ventricular diastolic parameters were normal. Normal left ventricular filling pressure. Right Ventricle: The right ventricular size is normal. No increase in right ventricular wall thickness. Right ventricular systolic function is normal. Tricuspid regurgitation signal is inadequate for assessing PA pressure. Left Atrium: Left atrial size was  normal in size. Right Atrium: Right atrial size was normal in size. Pericardium: There is no evidence of pericardial effusion. Mitral Valve: The mitral valve is normal in structure and function. Normal mobility of the mitral valve leaflets. Mild mitral valve regurgitation. No evidence of mitral valve stenosis. Tricuspid Valve: The tricuspid valve is normal in structure. Tricuspid valve regurgitation is trivial. No evidence of tricuspid stenosis. Aortic Valve: The aortic valve is normal in structure and function. Aortic valve regurgitation is not visualized. No  aortic stenosis is present. Pulmonic Valve: The pulmonic valve was normal in structure. Pulmonic valve regurgitation is not visualized. No evidence of pulmonic stenosis. Aorta: The aortic root is normal in size and structure. Venous: IVC assessment for right atrial pressure unable to be performed due to mechanical ventilation. The inferior vena cava is normal in size with greater than 50% respiratory variability, suggesting right atrial pressure of 3 mmHg. IAS/Shunts: No atrial level shunt detected by color flow Doppler.  LEFT VENTRICLE PLAX 2D LVIDd:         4.50 cm  Diastology LVIDs:         2.80 cm  LV e' lateral:   11.30 cm/s LV PW:         1.10 cm  LV E/e' lateral: 9.5 LV IVS:        1.20 cm  LV e' medial:    9.68 cm/s LVOT diam:     2.00 cm  LV E/e' medial:  11.1 LV SV:         61 LV SV Index:   36 LVOT Area:     3.14 cm  RIGHT VENTRICLE RV Basal diam:  2.90 cm RV S prime:     13.80 cm/s TAPSE (M-mode): 3.3 cm LEFT ATRIUM             Index       RIGHT ATRIUM           Index LA diam:        4.50 cm 2.65 cm/m  RA Area:     11.90 cm LA Vol (A2C):   45.3 ml 26.65 ml/m RA Volume:   26.60 ml  15.65 ml/m LA Vol (A4C):   40.5 ml 23.83 ml/m LA Biplane Vol: 45.7 ml 26.89 ml/m  AORTIC VALVE LVOT Vmax:   84.90 cm/s LVOT Vmean:  52.600 cm/s LVOT VTI:    0.195 m  AORTA Ao Root diam: 2.60 cm MITRAL VALVE MV Area (PHT): 3.37 cm     SHUNTS MV Decel Time: 225 msec     Systemic VTI:  0.20 m MV E velocity: 107.00 cm/s  Systemic Diam: 2.00 cm MV A velocity: 66.80 cm/s MV E/A ratio:  1.60 Fransico Him MD Electronically signed by Fransico Him MD Signature Date/Time: 07/20/2019/12:37:18 PM    Final     Labs:  CBC: Recent Labs    07/21/19 0405 07/22/19 0643 07/23/19 0645 07/24/19 0529  WBC 9.4 10.1 8.0 6.1  HGB 12.2 11.8* 13.3 12.1  HCT 39.0 37.3 41.4 37.4  PLT 214 147* 194 192    COAGS: Recent Labs    07/19/19 2300  INR 1.0  APTT 30    BMP: Recent Labs    07/21/19 0405 07/22/19 0643 07/23/19 0645  07/24/19 0529  NA 145 142 140 140  K 4.3 3.6 3.8 3.6  CL 118* 112* 103 101  CO2 19* 23 27 29   GLUCOSE 125* 168* 133* 135*  BUN 12 13 15  24*  CALCIUM 8.1*  8.2* 8.5* 8.4*  CREATININE 1.43* 1.01* 0.75 0.86  GFRNONAA 37* 56* >60 >60  GFRAA 43* >60 >60 >60    LIVER FUNCTION TESTS: Recent Labs    07/19/19 2300  BILITOT 0.9  AST 22  ALT 15  ALKPHOS 106  PROT 7.8  ALBUMIN 4.0    Assessment and Plan:  Acute CVA s/p cerebral arteriogram with emergent mechanical thrombectomy of left MCA M2 occlusion achieving a TICI 2C revascularization 07/20/2019 by Dr. Karenann Cai. Patient's condition stable- nonverbal but able to nod yes/no to questions, follows simple commands, moving all 4s. Plan to follow-up with Dr. Karenann Cai in clinic 4 weeks after discharge (order placed to facilitate this). Further plans per neurology/CCM/cardiology- appreciate and agree with management. Please call NIR with questions/concerns.   Interpretor [id number] was used throughout today's interaction.   Electronically Signed: Earley Abide, PA-C 07/24/2019, 11:05 AM   I spent a total of 25 Minutes at the the patient's bedside AND on the patient's hospital floor or unit, greater than 50% of which was counseling/coordinating care for left MCA M2 occlusion s/p revascularization.

## 2019-07-24 NOTE — Progress Notes (Signed)
STROKE TEAM PROGRESS NOTE   INTERVAL HISTORY Son at bedside. Giving her 1-step commands in Spanish patient states she is getting better and has no complaints.  Vital signs are stable. CT scan of the head today shows improvement in the subarachnoid hemorrhage in the left sylvian fissure with only a small punctate hemorrhage in the posterior margin of the infarct Vitals:   07/23/19 2008 07/23/19 2344 07/24/19 0409 07/24/19 0900  BP: 117/66 95/66 (!) 101/59 112/79  Pulse: 63 (!) 59 (!) 57 61  Resp: 19 18 18 18   Temp: 98.2 F (36.8 C) 98.1 F (36.7 C) 97.9 F (36.6 C) 99.6 F (37.6 C)  TempSrc: Oral Oral Oral Oral  SpO2: 98% 97% 98% 98%  Weight:      Height:       CBC:  Recent Labs  Lab 07/19/19 2300 07/19/19 2305 07/23/19 0645 07/24/19 0529  WBC 6.9   < > 8.0 6.1  NEUTROABS 2.7  --   --   --   HGB 14.8   < > 13.3 12.1  HCT 45.4   < > 41.4 37.4  MCV 92.5   < > 91.0 91.4  PLT 256   < > 194 192   < > = values in this interval not displayed.   Basic Metabolic Panel:  Recent Labs  Lab 07/22/19 0643 07/22/19 1643 07/23/19 0645 07/24/19 0529  NA   < >  --  140 140  K   < >  --  3.8 3.6  CL   < >  --  103 101  CO2   < >  --  27 29  GLUCOSE   < >  --  133* 135*  BUN   < >  --  15 24*  CREATININE   < >  --  0.75 0.86  CALCIUM   < >  --  8.5* 8.4*  MG  --  2.2 2.0  --   PHOS  --  3.5 3.6  --    < > = values in this interval not displayed.    IMAGING past 24 hours CT HEAD WO CONTRAST  Result Date: 07/24/2019 CLINICAL DATA:  Stroke, follow-up. Revascularization of left M2 division. EXAM: CT HEAD WITHOUT CONTRAST TECHNIQUE: Contiguous axial images were obtained from the base of the skull through the vertex without intravenous contrast. COMPARISON:  CT head without contrast 07/20/2019. MR head without contrast 07/22/2019 FINDINGS: Brain: Left frontal operculum nonhemorrhagic infarct is stable. Punctate infarcts of the left parietal lobe are not appreciated by CT. Trace blood in  the left sylvian fissure is resolving. Mass effect with 3 mm midline shift is noted. Ventricles are otherwise normal. No new infarcts are present. The brainstem and cerebellum are normal. Scattered parenchymal calcifications are again seen. Vascular: No hyperdense vessel or unexpected calcification. Skull: No significant extracranial soft tissue lesion is present. Calvarium is intact. No focal lytic or blastic lesions are present. Sinuses/Orbits: Chronic left middle ear and mastoid effusion is present. Paranasal sinuses and right mastoid air cells are clear. The globes and orbits are within normal limits. IMPRESSION: 1. Stable left frontal operculum nonhemorrhagic infarct. 2. Resolving subarachnoid blood in the left Sylvian fissure. 3. No new infarcts. 4. Chronic left middle ear and mastoid effusion. Electronically Signed   By: San Morelle M.D.   On: 07/24/2019 06:27   DG Swallowing Func-Speech Pathology  Result Date: 07/24/2019 Objective Swallowing Evaluation: Type of Study: MBS-Modified Barium Swallow Study  Patient Details Name: Jamie  Verdell Mitchell MRN: [id number] Date of Birth: 1946/05/12 Today's Date: 07/24/2019 Time: SLP Start Time (ACUTE ONLY): 0810 -SLP Stop Time (ACUTE ONLY): 0830 SLP Time Calculation (min) (ACUTE ONLY): 20 min Past Medical History: Past Medical History: Diagnosis Date . Hypertension  . Thyroid disease  Past Surgical History: Past Surgical History: Procedure Laterality Date . IR CT HEAD LTD  07/20/2019 . IR PERCUTANEOUS ART THROMBECTOMY/INFUSION INTRACRANIAL INC DIAG ANGIO  07/20/2019 . RADIOLOGY WITH ANESTHESIA N/A 07/20/2019  Procedure: IR WITH ANESTHESIA;  Surgeon: Luanne Bras, MD;  Location: [facility];  Service: Radiology;  Laterality: N/A; HPI: 74 yo F who presented with right sided weakness found to have acute left M2/ MCA occlusion s/p tPA and emergent thrombectomy which was complicated by moderate left sylvian SAH.  On 2/25 patient with Afib/ Aflutter with RVR and post  conversion pauses. VDRF and was extubated am of 2/27.   No data recorded Assessment / Plan / Recommendation CHL IP CLINICAL IMPRESSIONS 07/24/2019 Clinical Impression Pt demonstrates a primary oral dysphagia with slow volitional initaition of swallow activities.  Paticular difficulty initiating and coordinating mastication, which was slow, but complete with mild right buccal residue.  Pt has mild right labial weakness with anterior spillage with large quantitiy rapid self feeding. She is able to sip from a straw, and ultimately this was most effective, with assist, to control bolus size and rate. When self feeding pt will often take multiple sips with complete oral cohesion of the bolus before triggered posterior transit and swallow response. There is therefore typically piecemeal transit of a very large bolus. Pharyngeal phase generally is WNL except for some mild oral/base of tongue transit that accumulates in the valleculae. Pt only experienced one instance of sensed aspiration when allowed to very impulsively take independent cup sips, throwing head back and resulting in an instance of premature spillage. Cough response immediate, hard and effective. Will initiate a dys 1 (puree) diet with thin liquids with good potential for upgraded solids with therapy. Suspect automaticity will improve with intake.  SLP Visit Diagnosis Dysphagia, oropharyngeal phase (R13.12) Attention and concentration deficit following -- Frontal lobe and executive function deficit following -- Impact on safety and function --   CHL IP TREATMENT RECOMMENDATION 07/24/2019 Treatment Recommendations Therapy as outlined in treatment plan below   Prognosis 07/24/2019 Prognosis for Safe Diet Advancement Good Barriers to Reach Goals Language deficits Barriers/Prognosis Comment -- CHL IP DIET RECOMMENDATION 07/24/2019 SLP Diet Recommendations Dysphagia 1 (Puree) solids;Thin liquid Liquid Administration via Straw Medication Administration Crushed with puree  Compensations Slow rate;Small sips/bites Postural Changes Seated upright at 90 degrees   CHL IP OTHER RECOMMENDATIONS 07/24/2019 Recommended Consults -- Oral Care Recommendations Oral care BID Other Recommendations --   CHL IP FOLLOW UP RECOMMENDATIONS 07/24/2019 Follow up Recommendations Inpatient Rehab   CHL IP FREQUENCY AND DURATION 07/24/2019 Speech Therapy Frequency (ACUTE ONLY) min 2x/week Treatment Duration 2 weeks      CHL IP ORAL PHASE 07/24/2019 Oral Phase Impaired Oral - Pudding Teaspoon -- Oral - Pudding Cup -- Oral - Honey Teaspoon -- Oral - Honey Cup -- Oral - Nectar Teaspoon Delayed oral transit;Piecemeal swallowing;Lingual/palatal residue Oral - Nectar Cup Delayed oral transit;Piecemeal swallowing;Lingual/palatal residue;Right anterior bolus loss Oral - Nectar Straw -- Oral - Thin Teaspoon -- Oral - Thin Cup Delayed oral transit;Piecemeal swallowing;Lingual/palatal residue Oral - Thin Straw Delayed oral transit;Piecemeal swallowing;Lingual/palatal residue Oral - Puree Delayed oral transit;Piecemeal swallowing;Lingual/palatal residue Oral - Mech Soft Delayed oral transit;Piecemeal swallowing;Lingual/palatal residue;Right pocketing in lateral sulci;Impaired mastication Oral -  Regular -- Oral - Multi-Consistency -- Oral - Pill -- Oral Phase - Comment --  CHL IP PHARYNGEAL PHASE 07/24/2019 Pharyngeal Phase Impaired Pharyngeal- Pudding Teaspoon -- Pharyngeal -- Pharyngeal- Pudding Cup -- Pharyngeal -- Pharyngeal- Honey Teaspoon -- Pharyngeal -- Pharyngeal- Honey Cup -- Pharyngeal -- Pharyngeal- Nectar Teaspoon WFL Pharyngeal -- Pharyngeal- Nectar Cup WFL Pharyngeal -- Pharyngeal- Nectar Straw -- Pharyngeal -- Pharyngeal- Thin Teaspoon -- Pharyngeal -- Pharyngeal- Thin Cup Penetration/Aspiration before swallow;Pharyngeal residue - valleculae Pharyngeal Material enters airway, passes BELOW cords then ejected out;Material does not enter airway Pharyngeal- Thin Straw WFL Pharyngeal -- Pharyngeal- Puree Pharyngeal  residue - valleculae;Reduced tongue base retraction Pharyngeal -- Pharyngeal- Mechanical Soft -- Pharyngeal -- Pharyngeal- Regular Pharyngeal residue - valleculae;Reduced tongue base retraction Pharyngeal -- Pharyngeal- Multi-consistency -- Pharyngeal -- Pharyngeal- Pill -- Pharyngeal -- Pharyngeal Comment --  No flowsheet data found. Herbie [place], MA CCC-SLP Acute Rehabilitation Services Pager [phone] Office [phone] Lynann Beaver 07/24/2019, 9:03 AM                PHYSICAL EXAM    General -pleasant middle-aged Hispanic lady not in distress Ophthalmologic - fundi not visualized due to noncooperation.  Cardiovascular - irregularly irregular heart rate and rhythm wheeze intermittent RVR.  Neurological Exam :  -Awake alert, eyes open, follows command bilaterally with visual cues and some simple voice commands, does speak a few words but appears to have nonfluent speech.  Left gaze preference, but able to cross midline with intermittent right gaze. Blinking to visual threat on the left but not on the right, PERRL.  Mild right lower facial weakness.  Tongue protrusion not cooperative. On pain stimulation, RUE 3+/5 with drift and RLE 3+/5. LUE and LLE spontaneous movement against gravity. DTR 1+ and toes equiv. Sensation, coordination and gait not tested.   ASSESSMENT/PLAN Jamie Mitchell is a 74 y.o. female with history of HTN, HLD and thyroid disease presenting with R hemiparesis, Left gaze preference, R facial weakness, R field cut and global aphasia. AF noted by EMS. Received tPA 07/19/2019 at 2317. Found to have  L M2 occlusion and taken to IR.   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 Old high L frontal infarct. ASPECTS 9.    CTA head & neck L M2  LVO. Diffuse tortuosity head and neck. Fetal PCAs w/ small VB system.   Cerebral angio TICI2c revascularization LM2 superior division branch occlusion. Moderate L sylvian SAH   CT head post IR anterolateral L frontal lobe infarct in operculum, insula and subinsular region. Small to moderate L sylvian fissures into basal cisterns SAH.   Repeat CT head L sylvian fissure SAH less dense. L frontal operculum, insula and subinsular infarct well seen. Mild 1-38mm midline shift.  MRI /MRA 2/27 - Large acute left MCA superior division infarct with minimal petechial hemorrhage and unchanged minimal midline shift. Punctate acute left parietal and occipital infarcts. Residual small volume subarachnoid hemorrhage. 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  CT head 3/1 stable. Resolving SAH. No new infarcts  2D Echo EF 60-65%. No source of embolus   LDL 66  HgbA1c 6.0  UDS +benzos  SCDs for VTE prophylaxis  aspirin 81 mg daily prior to admission, now on No  antithrombotic d/t focal SAH.  Repeat CT Head stable add Asa today. Plan to start eliquis in 7-10 days.     Therapy recommendations:  CIR    Disposition:  pending   Paroxysmal Atrial Fibrillation/Atrial Flutter w/ RVR and post conversion pauses  TSH 6.781->7.052, free T4 normal 0.80   CHA2DS2-VASc Score = at least 5, ?2 oral anticoagulation recommended  Age in Years:  11-74   +1    Sex:  Female   Female   +1    Hypertension History:  yes   +1     Diabetes Mellitus:  0  Congestive Heart Failure History:  0  Vascular Disease History:  0     Stroke/TIA/Thromboembolism History:  yes   +2 . Cardiology on board, appreciate help . Now on Amiodarone 200 mg BID per tube  . Plan anticoagulation in 7-10 days w/ resolution SAH   Acute Respiratory Failure  Intubated for IR, left intubated post IR for airway protection  Off Sedation   Now extubated  CCM signed off  Hypotension Hx of  hypertension  Home meds:  HCTZ 25 . Treated with Neo drip in ICU  Current SBP goal < 160 given SAH   BP stab;e . Long-term BP goal normotensive  Hyperlipidemia  Home meds:  lipitor 40, resumed in hospital  LDL 66, goal < 70  Continue statin at discharge  Acute renal failure, resolved  Urinary retention,  > 1L with in and out   On Foley catheter now   AKI likely postobstructive.   Creatinine 0.86  Close BMP monitoring  Prediabetes  HgbA1c 6.0, goal < 7.0  CBGs, SSI  PCP follow up  Other Stroke Risk Factors  Advanced age  Overweight, Body mass index is 28.63 kg/m., recommend weight loss, diet and exercise as appropriate   Other Active Problems  Hypothyroid on synthroid - TSH 6.781-> 7.052, free T4 normal 0.80    Vomited coffee ground emesis. PPI bid added. Hgb 12.1 - resolved  Hospital day # 4  I have personally obtained history,examined this patient, reviewed notes, independently viewed imaging studies, participated in medical decision making and plan of care.ROS completed by me personally and pertinent positives fully documented  I have made any additions or clarifications directly to the above note.  CT head shows improvement in the subarachnoid hemorrhage hence we will start aspirin today and switch to anticoagulation in 7 to 10 days from the stroke.  Long discussion with the patient and her son who interpreted for her and answered questions.  Greater than 50% time during this 25-minute visit was spent on counseling and coordination of care and answering questions  Antony Contras, [place] Pager: [phone] 07/24/2019 4:08 PM   To contact Stroke Continuity provider, please refer to [email or web address] After hours, contact General Neurology

## 2019-07-24 NOTE — Progress Notes (Signed)
Patient back in room from testing.

## 2019-07-24 NOTE — Progress Notes (Signed)
Nutrition Follow-up  **RD working remotely**  DOCUMENTATION CODES:   Not applicable  INTERVENTION:  Change TF to nocturnal feeding: Osmolite 1.2 cal @ 49ml/hr  73ml Pro-stat BID  This will provide 776 kcals, 56 grams of protein, 342ml free water. This will meet 51% estimated calorie needs, 70% estimated protein needs.   RD will follow for PO intake and make adjustments as needed.    Ensure Enlive po BID, each supplement provides 350 kcal and 20 grams of protein   NUTRITION DIAGNOSIS:   Inadequate oral intake related to dysphagia as evidenced by meal completion < 50%.  GOAL:   Patient will meet greater than or equal to 90% of their needs  Progressing.   MONITOR:   PO intake, Supplement acceptance, Diet advancement, TF tolerance, Weight trends, Labs  REASON FOR ASSESSMENT:   Ventilator, Consult Enteral/tube feeding initiation and management  ASSESSMENT:   Pt with PMH of  HTN admitted with acute L MCA s/p tPA and IR for thrombectomy complicated by [facility].  [id number] - Cortrak (gastric) 2/27 - extubated 3/1 - MBS, DYS1/thins  Pt noted to be aphasic.   No PO intake documented. Discussed pt with RN who reports pt has not yet eaten a meal, breakfast is at bedside.  Current tube feeding regimen: Osmolite 1.2 cal @ 20ml/hr, 63ml Pro-stat BID  RD will switch pt to nocturnal feeding to promote PO intake.   Medications reviewed and include: SSI  Labs reviewed. CBGs 128-156  Diet Order:   Diet Order            DIET - DYS 1 Room service appropriate? Yes; Fluid consistency: Thin  Diet effective now              EDUCATION NEEDS:   No education needs have been identified at this time  Skin:  Skin Assessment: Skin Integrity Issues: Skin Integrity Issues:: Other (Comment) Other: non-pressure wound, back  Last BM:  unknown  Height:   Ht Readings from Last 1 Encounters:  07/19/19 5\' 2"  (1.575 m)    Weight:   Wt Readings from Last 1 Encounters:  07/23/19 71  kg    BMI:  Body mass index is 28.63 kg/m.  Estimated Nutritional Needs:   Kcal:  1500-1700  Protein:  80-95 grams  Fluid:  >1.5L/d   Larkin Ina, MS, RD, LDN RD pager number and weekend/on-call pager number located in [place].

## 2019-07-24 NOTE — Progress Notes (Signed)
OT Treatment Note    07/24/19 1438  OT Visit Information  Last OT Received On 07/24/19  Assistance Needed +1  History of Present Illness 74 yo F who presented with right sided weakness found to have acute left M2/ MCA occlusion s/p tPA and emergent thrombectomy which was complicated by moderate left sylvian SAH.  On 2/25 patient with Afib/ Aflutter with RVR and post conversion pauses. VDRF and was extubated am of 2/27.    Precautions  Precautions Fall  Pain Assessment  Pain Assessment Faces  Faces Pain Scale 2  Pain Location headache  Pain Descriptors / Indicators Discomfort  Pain Intervention(s) Limited activity within patient's tolerance  Cognition  Arousal/Alertness Awake/alert  Behavior During Therapy Flat affect  Overall Cognitive Status Impaired/Different from baseline  Area of Impairment Attention;Following commands;Safety/judgement;Awareness;Problem solving  Current Attention Level Sustained  Following Commands Follows one step commands inconsistently;Follows one step commands with increased time  Safety/Judgement Decreased awareness of safety;Decreased awareness of deficits  Awareness Emergent  Problem Solving Slow processing;Difficulty sequencing  ADL  General ADL Comments Pt issued lidded cup. Does better holding handled cup adn using straw. Continues to have spillage but pt inititaing bringing cloth to mouth  Exercises  Exercises Other exercises  Other Exercises  Other Exercises issued yellow theraputty - grip and pinch strengthening  Other Exercises squeeze ball exercises  OT - End of Session  Activity Tolerance Patient tolerated treatment well  Patient left in chair;with call bell/phone within reach;with chair alarm set;with family/visitor present  Nurse Communication Mobility status  OT Assessment/Plan  OT Plan Discharge plan remains appropriate  OT Visit Diagnosis Other abnormalities of gait and mobility (R26.89);Muscle weakness (generalized) (M62.81);Other  symptoms and signs involving cognitive function;Pain  Pain - part of body  (headache)  OT Frequency (ACUTE ONLY) Min 2X/week  Recommendations for Other Services Rehab consult  Follow Up Recommendations CIR;Supervision/Assistance - 24 hour  OT Equipment 3 in 1 bedside commode  AM-PAC OT "6 Clicks" Daily Activity Outcome Measure (Version 2)  Help from another person eating meals? 3  Help from another person taking care of personal grooming? 3  Help from another person toileting, which includes using toliet, bedpan, or urinal? 3  Help from another person bathing (including washing, rinsing, drying)? 2  Help from another person to put on and taking off regular upper body clothing? 3  Help from another person to put on and taking off regular lower body clothing? 2  6 Click Score 16  OT Goal Progression  Progress towards OT goals Progressing toward goals  Acute Rehab OT Goals  Patient Stated Goal Son is to take his mother home  OT Goal Formulation With patient/family  Time For Goal Achievement 08/07/19  Potential to Achieve Goals Good  OT Time Calculation  OT Start Time (ACUTE ONLY) 1405  OT Stop Time (ACUTE ONLY) 1422  OT Time Calculation (min) 17 min  OT General Charges  $OT Visit 1 Visit  OT Treatments  $Therapeutic Activity 8-22 mins  Maurie Boettcher, OT/L   Acute OT Clinical Specialist [place] Pager [phone] Office [phone]

## 2019-07-25 ENCOUNTER — Encounter (HOSPITAL_COMMUNITY): Payer: Self-pay | Admitting: Physical Medicine & Rehabilitation

## 2019-07-25 ENCOUNTER — Inpatient Hospital Stay (HOSPITAL_COMMUNITY)
Admission: RE | Admit: 2019-07-25 | Discharge: 2019-08-03 | DRG: 057 | Disposition: A | Discharge status: Home Health | Payer: Medicare Other | Source: Intra-hospital | Attending: Physical Medicine & Rehabilitation | Admitting: Physical Medicine & Rehabilitation

## 2019-07-25 ENCOUNTER — Other Ambulatory Visit: Payer: Self-pay

## 2019-07-25 DIAGNOSIS — I69391 Dysphagia following cerebral infarction: Secondary | ICD-10-CM | POA: Diagnosis not present

## 2019-07-25 DIAGNOSIS — E785 Hyperlipidemia, unspecified: Secondary | ICD-10-CM | POA: Diagnosis present

## 2019-07-25 DIAGNOSIS — N19 Unspecified kidney failure: Secondary | ICD-10-CM

## 2019-07-25 DIAGNOSIS — E039 Hypothyroidism, unspecified: Secondary | ICD-10-CM | POA: Diagnosis present

## 2019-07-25 DIAGNOSIS — I69320 Aphasia following cerebral infarction: Secondary | ICD-10-CM

## 2019-07-25 DIAGNOSIS — I69351 Hemiplegia and hemiparesis following cerebral infarction affecting right dominant side: Secondary | ICD-10-CM | POA: Diagnosis not present

## 2019-07-25 DIAGNOSIS — Z79890 Hormone replacement therapy (postmenopausal): Secondary | ICD-10-CM | POA: Diagnosis not present

## 2019-07-25 DIAGNOSIS — R1311 Dysphagia, oral phase: Secondary | ICD-10-CM | POA: Diagnosis present

## 2019-07-25 DIAGNOSIS — I10 Essential (primary) hypertension: Secondary | ICD-10-CM | POA: Diagnosis present

## 2019-07-25 DIAGNOSIS — R7303 Prediabetes: Secondary | ICD-10-CM

## 2019-07-25 DIAGNOSIS — I63512 Cerebral infarction due to unspecified occlusion or stenosis of left middle cerebral artery: Secondary | ICD-10-CM | POA: Diagnosis not present

## 2019-07-25 DIAGNOSIS — I639 Cerebral infarction, unspecified: Secondary | ICD-10-CM | POA: Diagnosis not present

## 2019-07-25 DIAGNOSIS — R1310 Dysphagia, unspecified: Secondary | ICD-10-CM

## 2019-07-25 DIAGNOSIS — I4891 Unspecified atrial fibrillation: Secondary | ICD-10-CM | POA: Diagnosis present

## 2019-07-25 DIAGNOSIS — I4892 Unspecified atrial flutter: Secondary | ICD-10-CM | POA: Diagnosis present

## 2019-07-25 DIAGNOSIS — I483 Typical atrial flutter: Secondary | ICD-10-CM | POA: Diagnosis not present

## 2019-07-25 HISTORY — DX: Unspecified kidney failure: N19

## 2019-07-25 LAB — CBC
HCT: 40.6 % (ref 36.0–46.0)
Hemoglobin: 13.4 g/dL (ref 12.0–15.0)
MCH: 29.3 pg (ref 26.0–34.0)
MCHC: 33 g/dL (ref 30.0–36.0)
MCV: 88.8 fL (ref 80.0–100.0)
Platelets: 230 10*3/uL (ref 150–400)
RBC: 4.57 MIL/uL (ref 3.87–5.11)
RDW: 13.2 % (ref 11.5–15.5)
WBC: 6.8 10*3/uL (ref 4.0–10.5)
nRBC: 0 % (ref 0.0–0.2)

## 2019-07-25 LAB — BASIC METABOLIC PANEL
Anion gap: 11 (ref 5–15)
BUN: 23 mg/dL (ref 8–23)
CO2: 24 mmol/L (ref 22–32)
Calcium: 8.6 mg/dL — ABNORMAL LOW (ref 8.9–10.3)
Chloride: 105 mmol/L (ref 98–111)
Creatinine, Ser: 0.8 mg/dL (ref 0.44–1.00)
GFR calc Af Amer: >60 mL/min (ref >60)
GFR calc non Af Amer: >60 mL/min (ref >60)
Glucose, Bld: 122 mg/dL — ABNORMAL HIGH (ref 70–99)
Potassium: 3.9 mmol/L (ref 3.5–5.1)
Sodium: 140 mmol/L (ref 135–145)

## 2019-07-25 LAB — GLUCOSE, CAPILLARY
Glucose-Capillary: 140 mg/dL — ABNORMAL HIGH (ref 70–99)
Glucose-Capillary: 121 mg/dL — ABNORMAL HIGH (ref 70–99)
Glucose-Capillary: 142 mg/dL — ABNORMAL HIGH (ref 70–99)
Glucose-Capillary: 112 mg/dL — ABNORMAL HIGH (ref 70–99)
Glucose-Capillary: 136 mg/dL — ABNORMAL HIGH (ref 70–99)

## 2019-07-25 MED ORDER — SODIUM CHLORIDE 0.9% FLUSH: 10.0000 mL | INTRAVENOUS | Status: DC | PRN

## 2019-07-25 MED ORDER — ASPIRIN 81 MG PO TBEC: 81.0000 mg | DELAYED_RELEASE_TABLET | Freq: Every day | ORAL | Status: DC

## 2019-07-25 MED ORDER — ONDANSETRON HCL 4 MG PO TABS: 4.0000 mg | ORAL_TABLET | Freq: Four times a day (QID) | ORAL | Status: DC | PRN

## 2019-07-25 MED ORDER — ONDANSETRON HCL 4 MG/2ML IJ SOLN: 4.0000 mg | Freq: Four times a day (QID) | INTRAMUSCULAR | Status: DC | PRN

## 2019-07-25 MED ORDER — CHLORHEXIDINE GLUCONATE 0.12 % MT SOLN: 15.0000 mL | Freq: Two times a day (BID) | OROMUCOSAL | 0 refills | Status: DC

## 2019-07-25 MED ORDER — ACETAMINOPHEN 650 MG RE SUPP: 650.0000 mg | RECTAL | Status: DC | PRN

## 2019-07-25 MED ORDER — ATORVASTATIN CALCIUM 40 MG PO TABS: 40.0000 mg | ORAL_TABLET | Freq: Every day | ORAL | Status: DC

## 2019-07-25 MED ORDER — AMIODARONE HCL 200 MG PO TABS
200.0000 mg | ORAL_TABLET | Freq: Two times a day (BID) | ORAL | Status: DC
  Administered 2019-07-25: 200 mg
  Filled 2019-07-25: qty 1

## 2019-07-25 MED ORDER — ENSURE ENLIVE PO LIQD: 237.0000 mL | Freq: Two times a day (BID) | ORAL | 12 refills | Status: DC

## 2019-07-25 MED ORDER — INSULIN ASPART 100 UNIT/ML ~~LOC~~ SOLN
0.0000 [IU] | Freq: Three times a day (TID) | SUBCUTANEOUS | Status: DC
  Administered 2019-07-25 – 2019-07-27 (×4): 1 [IU] via SUBCUTANEOUS
  Administered 2019-07-28: 2 [IU] via SUBCUTANEOUS
  Administered 2019-07-29 – 2019-07-30 (×3): 1 [IU] via SUBCUTANEOUS
  Administered 2019-07-31: 2 [IU] via SUBCUTANEOUS
  Administered 2019-08-01 – 2019-08-03 (×2): 1 [IU] via SUBCUTANEOUS

## 2019-07-25 MED ORDER — CHLORHEXIDINE GLUCONATE CLOTH 2 % EX PADS: 6.0000 | MEDICATED_PAD | Freq: Every day | CUTANEOUS | Status: DC

## 2019-07-25 MED ORDER — LEVOTHYROXINE SODIUM 100 MCG/5ML IV SOLN
37.5000 ug | Freq: Every day | INTRAVENOUS | Status: DC
  Filled 2019-07-25: qty 5

## 2019-07-25 MED ORDER — INSULIN ASPART 100 UNIT/ML ~~LOC~~ SOLN: 0.0000 [IU] | SUBCUTANEOUS | Status: DC

## 2019-07-25 MED ORDER — ACETAMINOPHEN 160 MG/5ML PO SOLN: 650.0000 mg | ORAL | Status: DC | PRN

## 2019-07-25 MED ORDER — INSULIN ASPART 100 UNIT/ML ~~LOC~~ SOLN: 0.0000 [IU] | SUBCUTANEOUS | 11 refills | Status: DC

## 2019-07-25 MED ORDER — SENNOSIDES-DOCUSATE SODIUM 8.6-50 MG PO TABS: 1.0000 | ORAL_TABLET | Freq: Every evening | ORAL | Status: DC | PRN

## 2019-07-25 MED ORDER — ORAL CARE MOUTH RINSE: 15.0000 mL | Freq: Two times a day (BID) | OROMUCOSAL | 0 refills | Status: DC

## 2019-07-25 MED ORDER — ONDANSETRON HCL 4 MG/2ML IJ SOLN
INTRAMUSCULAR | Status: AC
  Administered 2019-07-25: 4 mg via INTRAVENOUS
  Filled 2019-07-25: qty 2

## 2019-07-25 MED ORDER — ASPIRIN EC 81 MG PO TBEC
81.0000 mg | DELAYED_RELEASE_TABLET | Freq: Every day | ORAL | Status: DC
  Administered 2019-07-26 – 2019-08-03 (×9): 81 mg via ORAL
  Filled 2019-07-25 (×9): qty 1

## 2019-07-25 MED ORDER — AMIODARONE HCL 200 MG PO TABS: 200.0000 mg | ORAL_TABLET | Freq: Two times a day (BID) | ORAL | Status: DC

## 2019-07-25 MED ORDER — ACETAMINOPHEN 325 MG PO TABS
650.0000 mg | ORAL_TABLET | ORAL | Status: DC | PRN
  Administered 2019-07-26 – 2019-08-03 (×7): 650 mg via ORAL
  Filled 2019-07-25 (×7): qty 2

## 2019-07-25 NOTE — TOC Transition Note (Signed)
Transition of Care [facility]) - CM/SW Discharge Note   Patient Details  Name: Jamie Mitchell MRN: [id number] Date of Birth: Jul 27, 1945  [facility] CM/SW Contact:  Pollie Friar, RN Phone Number: 07/25/2019, 1:00 PM   Clinical Narrative:    Pt is discharging to CIR today. CM signing off.    Final next level of care: IP Rehab Facility Barriers to Discharge: No Barriers Identified   Patient Goals and CMS Choice        Discharge Placement                       Discharge Plan and Services                                     Social Determinants of Health (SDOH) Interventions     Readmission Risk Interventions No flowsheet data found.

## 2019-07-25 NOTE — Plan of Care (Signed)
1 

## 2019-07-25 NOTE — Progress Notes (Signed)
Pt arrived to unit, pt is alert, speaks spanish with exp aphasia, son in room. Oriented to rehab.

## 2019-07-25 NOTE — Discharge Summary (Addendum)
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 [facility]) s/p tPA and mechanical thrombectomy, d/t AF Active Problems:   Hypothyroidism   Hypertension   Hyperlipidemia   Acute respiratory failure ([place])   Tachycardia-bradycardia syndrome ([place])   Atrial fibrillation with RVR ([place])   Renal failure   Prediabetes   Medications to be continued on Rehab Allergies as of 07/25/2019   No Known Allergies      Medication List     STOP taking these medications    amLODipine 5 MG tablet Commonly known as: NORVASC   pentoxifylline 400 MG CR tablet Commonly known as: TRENTAL       TAKE these medications    amiodarone 200 MG tablet Commonly known as: PACERONE Take 1 tablet (200 mg total) by mouth 2 (two) times daily.   aspirin 81 MG EC tablet Take 1 tablet (81 mg total) by mouth daily. Start taking on: July 26, 2019 What changed:  medication strength how much to take when to take this   atorvastatin 40 MG tablet Commonly known as: LIPITOR Take 1 tablet (40 mg total) by mouth daily. Start taking on: July 26, 2019 What changed: Another medication with the same name was removed. Continue taking this medication, and follow the directions you see here.   chlorhexidine 0.12 % solution Commonly known as: PERIDEX 15 mLs by Mouth Rinse route 2 (two) times daily.   Chlorhexidine Gluconate Cloth 2 % Pads Apply 6 each topically daily.   feeding supplement  (ENSURE ENLIVE) Liqd Take 237 mLs by mouth 2 (two) times daily between meals.   insulin aspart 100 UNIT/ML injection Commonly known as: novoLOG Inject 0-9 Units into the skin every 4 (four) hours.   levothyroxine 100 MCG tablet Commonly known as: SYNTHROID Take 50-75 mcg by mouth See admin instructions. 35mg one day, 766m the next day, then repeat.   mouth rinse Liqd solution 15 mLs by Mouth Rinse route 2 times daily at 12 noon and 4 pm.   sodium chloride flush 0.9 % Soln Commonly known as: NS 10-40 mLs by Intracatheter route as needed (flush).        LABORATORY STUDIES CBC    Component Value Date/Time   WBC 6.8 07/25/2019 0806   RBC 4.57 07/25/2019 0806   HGB 13.4 07/25/2019 0806   HCT 40.6 07/25/2019 0806   PLT 230 07/25/2019 0806   MCV 88.8 07/25/2019 0806   MCH 29.3 07/25/2019 0806   MCHC 33.0 07/25/2019 0806   RDW 13.2 07/25/2019 0806   LYMPHSABS 3.4 07/19/2019 2300   MONOABS 0.6 07/19/2019 2300   EOSABS 0.2 07/19/2019 2300   BASOSABS 0.1 07/19/2019 2300   CMP    Component Value Date/Time   NA 140 07/25/2019 0806   NA 142 02/26/2017 1035   K 3.9 07/25/2019 0806   CL 105 07/25/2019 0806   CO2 24 07/25/2019 0806   GLUCOSE 122 (H) 07/25/2019 0806   BUN 23 07/25/2019 0806   BUN 15 02/26/2017 1035  CREATININE 0.80 07/25/2019 0806   CREATININE 0.80 07/24/2015 0947   CALCIUM 8.6 (L) 07/25/2019 0806   PROT 7.8 07/19/2019 2300   PROT 6.7 02/26/2017 1035   ALBUMIN 4.0 07/19/2019 2300   ALBUMIN 4.2 02/26/2017 1035   AST 22 07/19/2019 2300   ALT 15 07/19/2019 2300   ALKPHOS 106 07/19/2019 2300   BILITOT 0.9 07/19/2019 2300   BILITOT 0.5 02/26/2017 1035   GFRNONAA >60 07/25/2019 0806   GFRNONAA 77 07/24/2015 0947   GFRAA >60 07/25/2019 0806   GFRAA 88 07/24/2015 0947   COAGS Lab Results  Component Value Date   INR 1.0 07/19/2019   Lipid Panel    Component Value Date/Time   CHOL 113 07/20/2019 0356   CHOL 137 02/26/2017 1035   TRIG 57 07/20/2019  0356   HDL 36 (L) 07/20/2019 0356   HDL 35 (L) 02/26/2017 1035   CHOLHDL 3.1 07/20/2019 0356   VLDL 11 07/20/2019 0356   LDLCALC 66 07/20/2019 0356   LDLCALC 49 02/26/2017 1035   HgbA1C  Lab Results  Component Value Date   HGBA1C 6.1 (H) 07/20/2019   HGBA1C 6.0 (H) 07/20/2019   Urinalysis    Component Value Date/Time   COLORURINE STRAW (A) 07/21/2019 0058   APPEARANCEUR CLEAR 07/21/2019 0058   LABSPEC 1.014 07/21/2019 0058   PHURINE 6.0 07/21/2019 0058   GLUCOSEU NEGATIVE 07/21/2019 0058   HGBUR SMALL (A) 07/21/2019 0058   BILIRUBINUR NEGATIVE 07/21/2019 0058   KETONESUR NEGATIVE 07/21/2019 0058   PROTEINUR NEGATIVE 07/21/2019 0058   NITRITE NEGATIVE 07/21/2019 0058   LEUKOCYTESUR NEGATIVE 07/21/2019 0058   Urine Drug Screen     Component Value Date/Time   LABOPIA NONE DETECTED 07/21/2019 0058   COCAINSCRNUR NONE DETECTED 07/21/2019 0058   LABBENZ POSITIVE (A) 07/21/2019 0058   AMPHETMU NONE DETECTED 07/21/2019 0058   THCU NONE DETECTED 07/21/2019 0058   LABBARB NONE DETECTED 07/21/2019 0058    Alcohol Level    Component Value Date/Time   ETH <10 07/19/2019 2300    SIGNIFICANT DIAGNOSTIC STUDIES CT Code Stroke CTA Head W/WO contrast  Result Date: 07/19/2019 CLINICAL DATA:  Initial evaluation for acute right-sided weakness. EXAM: CT ANGIOGRAPHY HEAD AND NECK TECHNIQUE: Multidetector CT imaging of the head and neck was performed using the standard protocol during bolus administration of intravenous contrast. Multiplanar CT image reconstructions and MIPs were obtained to evaluate the vascular anatomy. Carotid stenosis measurements (when applicable) are obtained utilizing NASCET criteria, using the distal internal carotid diameter as the denominator. CONTRAST:  12m OMNIPAQUE IOHEXOL 350 MG/ML SOLN COMPARISON:  Prior head CT from earlier same day. FINDINGS: CTA NECK FINDINGS Aortic arch: Visualized aortic arch normal in caliber with normal branch pattern. Bovine branching  pattern with common origin of the right brachiocephalic and left common carotid artery noted. No hemodynamically significant stenosis seen about the origin of the great vessels. Subclavian arteries widely patent. Right carotid system: Right common and internal carotid arteries widely patent to the skull base without stenosis, dissection or occlusion. Proximal right ICA medialized into the retropharyngeal space. Focal tortuosity the distal cervical right ICA noted. Left carotid system: Left common and internal carotid arteries widely patent to the skull base without stenosis, dissection or occlusion. Proximal left ICA medialized into the retropharyngeal space. Evaluation the distal cervical left ICA limited by motion. Vertebral arteries: Both vertebral arteries arise from the subclavian arteries. Vertebral arteries mildly tortuous but widely patent within the neck without stenosis, dissection or occlusion. Skeleton: No acute osseous abnormality.  No discrete lytic or blastic osseous lesions. Prominent degenerative changes noted at the C1-2 articulation, greater on the right. Other neck: No other acute soft tissue abnormality within the neck. No mass lesion or adenopathy. Upper chest: Mild scattered atelectatic changes noted within the visualized lungs. Visualized upper chest demonstrates no acute finding. Review of the MIP images confirms the above findings CTA HEAD FINDINGS Anterior circulation: Examination mildly degraded by motion artifact. Petrous, cavernous, and supraclinoid ICAs widely patent without flow-limiting stenosis. ICA termini well perfused. A1 segments patent bilaterally. Normal anterior communicating artery complex. Anterior cerebral arteries widely patent to their distal aspects without stenosis. Right M1 widely patent. Normal right MCA bifurcation. Distal right MCA branches well perfused. Left M1 widely patent. Normal left MCA bifurcation. Occlusive thrombus seen within a proximal left M2 branch,  superior division (series 7, image 94), acute in nature. Attenuated opacification of left MCA branches distally, either due to subocclusive thrombus and/or moderate collateralization. Remainder of the left MCA branches remain patent. Posterior circulation: Vertebral arteries grossly patent to the vertebrobasilar junction without appreciable stenosis, although evaluation limited by motion. Neither PICA well visualized. Basilar diminutive but patent to its distal aspect without appreciable stenosis. Superior cerebral arteries patent bilaterally. Predominant fetal type origin of both PCAs, supplied via widely patent and robust posterior communicating arteries. Both PCAs well perfused to their distal aspects. Venous sinuses: Grossly patent allowing for timing the contrast bolus. Anatomic variants: Fetal type origin of the PCAs with overall diminutive vertebrobasilar system. No appreciable aneurysm. Review of the MIP images confirms the above findings IMPRESSION: 1. Positive CTA for emergent large vessel occlusion, with occlusive thrombus within a proximal left M2 branch, superior division. Attenuated flow distally either due to subocclusive thrombus and/or collateralization. 2. Diffuse tortuosity about the major arterial vasculature of the head and neck, suggesting chronic underlying hypertension. No other hemodynamically significant or correctable stenosis. 3. Fetal type origin of the PCAs with overall diminutive vertebrobasilar system. Critical Value/emergent results were called by telephone at the time of interpretation on 07/19/2019 at 11:26 pm to provider ERIC [facility] , who verbally acknowledged these results. Electronically Signed   By: Jeannine Boga M.D.   On: 07/19/2019 23:53   DG Chest 1 View  Result Date: 07/20/2019 CLINICAL DATA:  Intubation EXAM: CHEST  1 VIEW COMPARISON:  None. FINDINGS: Endotracheal tube tip is positioned within the proximal right mainstem bronchus. Heart size is mildly enlarged.  No focal airspace consolidation. No pleural effusion or pneumothorax. IMPRESSION: 1. Endotracheal tube tip within the proximal right mainstem bronchus. Recommend retraction approximately 3.0 cm. 2. Mild cardiomegaly. These results will be called to the ordering clinician or representative by the Radiologist Assistant, and communication documented in the PACS or zVision Dashboard. Electronically Signed   By: Davina Poke D.O.   On: 07/20/2019 10:36   CT HEAD WO CONTRAST  Result Date: 07/24/2019 CLINICAL DATA:  Stroke, follow-up. Revascularization of left M2 division. EXAM: CT HEAD WITHOUT CONTRAST TECHNIQUE: Contiguous axial images were obtained from the base of the skull through the vertex without intravenous contrast. COMPARISON:  CT head without contrast 07/20/2019. MR head without contrast 07/22/2019 FINDINGS: Brain: Left frontal operculum nonhemorrhagic infarct is stable. Punctate infarcts of the left parietal lobe are not appreciated by CT. Trace blood in the left sylvian fissure is resolving. Mass effect with 3 mm midline shift is noted. Ventricles are otherwise normal. No new infarcts are present. The brainstem and cerebellum are normal. Scattered parenchymal calcifications are again seen. Vascular: No hyperdense vessel  or unexpected calcification. Skull: No significant extracranial soft tissue lesion is present. Calvarium is intact. No focal lytic or blastic lesions are present. Sinuses/Orbits: Chronic left middle ear and mastoid effusion is present. Paranasal sinuses and right mastoid air cells are clear. The globes and orbits are within normal limits. IMPRESSION: 1. Stable left frontal operculum nonhemorrhagic infarct. 2. Resolving subarachnoid blood in the left Sylvian fissure. 3. No new infarcts. 4. Chronic left middle ear and mastoid effusion. Electronically Signed   By: San Morelle M.D.   On: 07/24/2019 06:27   CT HEAD WO CONTRAST  Result Date: 07/20/2019 CLINICAL DATA:  Follow-up  stroke EXAM: CT HEAD WITHOUT CONTRAST TECHNIQUE: Contiguous axial images were obtained from the base of the skull through the vertex without intravenous contrast. COMPARISON:  Head CT earlier same day. FINDINGS: Brain: Acute infarction of the left frontal operculum, anterior insular and subinsular brain as shown previously. Low-density and swelling but no evidence of further extension of the infarction. Small amount of subarachnoid hemorrhage at the base of the brain and within the sylvian fissure on the left is becoming slightly less dense. No evidence of additional or worsening hemorrhage. No significant mass effect. One or 2 mm of left-to-right midline shift at the most. No other area of acute or subacute infarction. 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 No hydrocephalus. No extra-axial collection. No intraparenchymal hemorrhage in the region of infarction. Vascular: No new primary vascular finding. Skull: Negative Sinuses/Orbits: Clear/normal Other: None IMPRESSION: Subarachnoid hemorrhage at the base of the brain and sylvian fissure on the left is becoming less dense. No evidence of additional bleeding. Well-circumscribed infarction in the left frontal operculum, insula and subinsular brain as seen previously. No evidence of infarct extension. Mild regional swelling with left-to-right shift of 1-2 mm. No infarct hematoma. Electronically Signed   By: Nelson Chimes M.D.   On: 07/20/2019 21:53   CT HEAD WO CONTRAST  Result Date: 07/20/2019 CLINICAL DATA:  Stroke, follow-up. EXAM: CT HEAD WITHOUT CONTRAST TECHNIQUE: Contiguous axial images were obtained from the base of the skull through the vertex without intravenous contrast. COMPARISON:  Postprocedure head CT 07/20/2019, noncontrast head CT and CT angiogram head/neck 07/19/2019 FINDINGS: Brain: Mildly motion degraded examination. There is residual circulating contrast. There has been continued  further demarcation of an acute ischemic infarct centered within the anterolateral left frontal lobe with involvement of the left frontal operculum as well as left insula and subinsular region. There may be subtle petechial hemorrhage in the infarction territory. There is no significant mass effect at this time. No midline shift. Small to moderate volume acute subarachnoid hemorrhage within the left sylvian fissure and extending to the basal cisterns does not appear significantly changed as compared postprocedural head CT 07/19/2019. No evidence of hydrocephalus. Vascular: Circulating contrast material limits evaluation for abnormal vascular hyperdensity. Skull: Normal. Negative for fracture or focal lesion. Sinuses/Orbits: Visualized orbits demonstrate no acute abnormality. Mild right maxillary sinus mucosal thickening. Redemonstrated left middle ear/mastoid effusion. IMPRESSION: Continued further demarcation of an acute ischemic infarct centered within the anterolateral left frontal lobe with involvement of the left frontal operculum as well as left insula and subinsular region. There may be mild petechial hemorrhage in the infarction territory. No significant mass effect at this time. Small to moderate volume acute subarachnoid hemorrhage within the left sylvian fissure and extending to the basal cisterns, not significantly changed from postprocedural head CT 07/19/2019. No evidence of hydrocephalus. Electronically Signed   By: Marylyn Ishihara  Golden DO   On: 07/20/2019 09:10   CT Code Stroke CTA Neck W/WO contrast  Result Date: 07/19/2019 CLINICAL DATA:  Initial evaluation for acute right-sided weakness. EXAM: CT ANGIOGRAPHY HEAD AND NECK TECHNIQUE: Multidetector CT imaging of the head and neck was performed using the standard protocol during bolus administration of intravenous contrast. Multiplanar CT image reconstructions and MIPs were obtained to evaluate the vascular anatomy. Carotid stenosis measurements (when  applicable) are obtained utilizing NASCET criteria, using the distal internal carotid diameter as the denominator. CONTRAST:  48m OMNIPAQUE IOHEXOL 350 MG/ML SOLN COMPARISON:  Prior head CT from earlier same day. FINDINGS: CTA NECK FINDINGS Aortic arch: Visualized aortic arch normal in caliber with normal branch pattern. Bovine branching pattern with common origin of the right brachiocephalic and left common carotid artery noted. No hemodynamically significant stenosis seen about the origin of the great vessels. Subclavian arteries widely patent. Right carotid system: Right common and internal carotid arteries widely patent to the skull base without stenosis, dissection or occlusion. Proximal right ICA medialized into the retropharyngeal space. Focal tortuosity the distal cervical right ICA noted. Left carotid system: Left common and internal carotid arteries widely patent to the skull base without stenosis, dissection or occlusion. Proximal left ICA medialized into the retropharyngeal space. Evaluation the distal cervical left ICA limited by motion. Vertebral arteries: Both vertebral arteries arise from the subclavian arteries. Vertebral arteries mildly tortuous but widely patent within the neck without stenosis, dissection or occlusion. Skeleton: No acute osseous abnormality. No discrete lytic or blastic osseous lesions. Prominent degenerative changes noted at the C1-2 articulation, greater on the right. Other neck: No other acute soft tissue abnormality within the neck. No mass lesion or adenopathy. Upper chest: Mild scattered atelectatic changes noted within the visualized lungs. Visualized upper chest demonstrates no acute finding. Review of the MIP images confirms the above findings CTA HEAD FINDINGS Anterior circulation: Examination mildly degraded by motion artifact. Petrous, cavernous, and supraclinoid ICAs widely patent without flow-limiting stenosis. ICA termini well perfused. A1 segments patent  bilaterally. Normal anterior communicating artery complex. Anterior cerebral arteries widely patent to their distal aspects without stenosis. Right M1 widely patent. Normal right MCA bifurcation. Distal right MCA branches well perfused. Left M1 widely patent. Normal left MCA bifurcation. Occlusive thrombus seen within a proximal left M2 branch, superior division (series 7, image 94), acute in nature. Attenuated opacification of left MCA branches distally, either due to subocclusive thrombus and/or moderate collateralization. Remainder of the left MCA branches remain patent. Posterior circulation: Vertebral arteries grossly patent to the vertebrobasilar junction without appreciable stenosis, although evaluation limited by motion. Neither PICA well visualized. Basilar diminutive but patent to its distal aspect without appreciable stenosis. Superior cerebral arteries patent bilaterally. Predominant fetal type origin of both PCAs, supplied via widely patent and robust posterior communicating arteries. Both PCAs well perfused to their distal aspects. Venous sinuses: Grossly patent allowing for timing the contrast bolus. Anatomic variants: Fetal type origin of the PCAs with overall diminutive vertebrobasilar system. No appreciable aneurysm. Review of the MIP images confirms the above findings IMPRESSION: 1. Positive CTA for emergent large vessel occlusion, with occlusive thrombus within a proximal left M2 branch, superior division. Attenuated flow distally either due to subocclusive thrombus and/or collateralization. 2. Diffuse tortuosity about the major arterial vasculature of the head and neck, suggesting chronic underlying hypertension. No other hemodynamically significant or correctable stenosis. 3. Fetal type origin of the PCAs with overall diminutive vertebrobasilar system. Critical Value/emergent results were called by telephone at the  time of interpretation on 07/19/2019 at 11:26 pm to provider ERIC [facility] , who  verbally acknowledged these results. Electronically Signed   By: Jeannine Boga M.D.   On: 07/19/2019 23:53   MR ANGIO HEAD WO CONTRAST  Result Date: 07/22/2019 CLINICAL DATA:  Stroke follow-up. Proximal left M2 occlusion status post revascularization. EXAM: MRI HEAD WITHOUT CONTRAST MRA HEAD WITHOUT CONTRAST TECHNIQUE: Multiplanar, multiecho pulse sequences of the brain and surrounding structures were obtained without intravenous contrast. Angiographic images of the head were obtained using MRA technique without contrast. COMPARISON:  Head CT 07/20/2019 and CTA 07/19/2019 FINDINGS: MRI HEAD FINDINGS The study is mildly motion degraded. Brain: A large acute infarct is again noted in the left MCA superior division involving the lateral and anterior aspects of the frontal lobe and insula with cytotoxic edema, minimal petechial hemorrhage, and unchanged trace rightward midline shift. Scattered punctate acute infarcts are also present in the left parietal and superior occipital lobes. Susceptibility artifact in the left sylvian fissure and FLAIR signal within multiple sulci of the posterior left greater than right cerebral hemispheres is compatible with previously demonstrated small volume subarachnoid hemorrhage. Several scattered chronic microhemorrhages are noted peripherally in both cerebral hemispheres. There is no extra-axial fluid collection. Vascular: Major intracranial vascular flow voids are preserved. Skull and upper cervical spine: Unremarkable bone marrow signal. Sinuses/Orbits: Unremarkable orbits. Moderate left mastoid effusion. Clear paranasal sinuses. Other: None. MRA HEAD FINDINGS The visualized distal vertebral arteries are widely patent to the basilar and codominant. Patent AICAs and SCA is are seen bilaterally. The basilar artery is widely patent. There are large posterior communicating arteries bilaterally with hypoplastic right and absent left P1 segments. Both PCAs are patent without  evidence of significant proximal stenosis. The internal carotid arteries are widely patent from skull base to carotid termini with ectasias of both cavernous segments. ACAs and MCAs are patent without evidence of significant A1 or M1 stenosis. The proximal left M2 superior division remains patent following revascularization, however there is a severe stenosis near/slightly distal to the site of previous occlusion. There is also asymmetric attenuation of more distal branch vessels in the left MCA superior division. No aneurysm is identified. IMPRESSION: 1. 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 2. Punctate acute left parietal and occipital infarcts. 3. Residual small volume subarachnoid hemorrhage. 4. 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 Electronically Signed   By: Logan Bores M.D.   On: 07/22/2019 18:25   MR BRAIN WO CONTRAST  Result Date: 07/22/2019 CLINICAL DATA:  Stroke follow-up. Proximal left M2 occlusion status post revascularization. EXAM: MRI HEAD WITHOUT CONTRAST MRA HEAD WITHOUT CONTRAST TECHNIQUE: Multiplanar, multiecho pulse sequences of the brain and surrounding structures were obtained without intravenous contrast. Angiographic images of the head were obtained using MRA technique without contrast. COMPARISON:  Head CT 07/20/2019 and CTA 07/19/2019 FINDINGS: MRI HEAD FINDINGS The study is mildly motion degraded. Brain: A large acute infarct is again noted in the left MCA superior division involving the lateral and anterior aspects of the frontal lobe and insula with cytotoxic edema, minimal petechial hemorrhage, and unchanged trace rightward midline shift. Scattered punctate acute infarcts are also present in the left parietal and superior occipital lobes. Susceptibility artifact in the left sylvian fissure and FLAIR signal within multiple sulci of the posterior  left greater than right cerebral hemispheres is compatible with previously demonstrated small volume subarachnoid hemorrhage. Several scattered chronic microhemorrhages  are noted peripherally in both cerebral hemispheres. There is no extra-axial fluid collection. Vascular: Major intracranial vascular flow voids are preserved. Skull and upper cervical spine: Unremarkable bone marrow signal. Sinuses/Orbits: Unremarkable orbits. Moderate left mastoid effusion. Clear paranasal sinuses. Other: None. MRA HEAD FINDINGS The visualized distal vertebral arteries are widely patent to the basilar and codominant. Patent AICAs and SCA is are seen bilaterally. The basilar artery is widely patent. There are large posterior communicating arteries bilaterally with hypoplastic right and absent left P1 segments. Both PCAs are patent without evidence of significant proximal stenosis. The internal carotid arteries are widely patent from skull base to carotid termini with ectasias of both cavernous segments. ACAs and MCAs are patent without evidence of significant A1 or M1 stenosis. The proximal left M2 superior division remains patent following revascularization, however there is a severe stenosis near/slightly distal to the site of previous occlusion. There is also asymmetric attenuation of more distal branch vessels in the left MCA superior division. No aneurysm is identified. IMPRESSION: 1. 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 2. Punctate acute left parietal and occipital infarcts. 3. Residual small volume subarachnoid hemorrhage. 4. 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 Electronically Signed   By: Logan Bores M.D.   On: 07/22/2019 18:25   IR CT Head Ltd  Result Date: 07/20/2019 INDICATION: 74 year old female with past medical history significant for hypothyroidism presenting as a code stroke  with right-sided hemiplegia and new onset atrial fibrillation. Her last known well was 21:50 on 07/19/2019. Head CT showed loss of gray-white differentiation in the left frontal for colon extending to the anterior aspect of the insula with no hemorrhage. CT angiogram showed a left M2/MCA/superior division branch occlusion. NIHSS 25. EXAM: Diagnostic cerebral angiogram Mechanical thrombectomy Flat panel head CT COMPARISON:  CT/CT angiogram of the head and neck July 19, 2019 MEDICATIONS: 5 mg of problem you have intra arterial to the left ICA. ANESTHESIA/SEDATION: General anesthesia CONTRAST:  75 mL FLUOROSCOPY TIME:  Fluoroscopy Time: 55 minutes 18 seconds (1,796 mGy). COMPLICATIONS: Moderate left sylvian subarachnoid hemorrhage TECHNIQUE: Informed written consent was obtained from the patient's son after a thorough discussion of the procedural risks, benefits and alternatives. All questions were addressed. Maximal Sterile Barrier Technique was utilized including caps, mask, sterile gowns, sterile gloves, sterile drape, hand hygiene and skin antiseptic. A timeout was performed prior to the initiation of the procedure. Using a micropuncture kit and modified Seldinger technique, access was gained to the right common femoral artery and an 8 French sheath was placed in the right common femoral artery. Under fluoroscopy, an 8 [place] Walrus balloon guide catheter was navigated over a 6 range Berenstein 2 catheter and a 0.035 inch Terumo Glidewire into the aortic arch. Under fluoroscopy, the catheter was advanced into the cervical right internal carotid artery. The wire and 6 French catheter were removed and angiograms of the head were obtained in frontal and lateral views. FINDINGS: There is a left M2/MCA superior division branch occlusion. Prominent tortuosity of the cervical left ICA and left carotid siphon. PROCEDURE: A large bore catheter aspiration catheter was navigated through the balloon guide catheter and over  a phenom 21 microcatheter and a synchro2 support microguidewire into the cavernous segment of the left ICA. The microcatheter was then advanced into the left M2/MCA superior division branch, distal to the point of occlusion. Frontal and lateral angiograms were obtained with microcatheter contrast injection.  A 4 x 40 mm solitaire stent retriever was subsequently deployed spanning the distal left M1-mid M2. The device was allowed to intercalated with the clot for 4 minutes. The microcatheter was removed. The guiding catheter balloon was inflated and constant aspiration was performed, the aspiration catheter was navigated near the occlusion and connected to a penumbra aspiration pump. The stent retriever was then retracted along with the aspiration catheter. Frontal and lateral angiograms were obtained showing persistent left M2/MCA occlusion. In a similar fashion and using the same platform, a second stent retriever pass was performed with the solitaire device. Frontal and lateral angiograms were obtained showing persistent left M2/MCA occlusion with the clot migrating proximally. A catalyst 6 aspiration catheter was navigated through the balloon guide catheter and over a phenom 21 microcatheter and posterior 0.014 inch Aristotle microguidewire into the cavernous segment of the left ICA. The microcatheter was then advanced into the left M2/MCA superior division branch, distal to the point of occlusion. Frontal and lateral angiograms were obtained with microcatheter contrast injection. A 4 x 41 mm trevo stent retriever was subsequently deployed spanning the distal left M1-mid M2. The device was allowed to intercalated with the clot for 4 minutes. The microcatheter was removed. The guiding catheter balloon was inflated and constant aspiration was performed, the aspiration catheter was navigated near the occlusion and connected to a penumbra aspiration pump. The stent retriever was then retracted along with the  aspiration catheter. Frontal and lateral angiograms were obtained showing distal contrast penetration into the left M2/MCA superior division branch with severe vasospasm. Intra arterial infusion of 5 mg of verapamil was performed over 10 minutes. Follow-up angiogram showed reocclusion of the left M2/MCA superior division branch. A 3 max aspiration catheter was navigated through the balloon guide catheter and over the Aristotle microguidewire into the left and 2/MCA superior division branch at the level of occlusion. Continuous is aspiration was performed for 4 minutes. The guiding catheter balloon was inflated and the aspiration catheter was then removed. Follow-up left ICA angiograms with frontal and lateral views showed recanalization of the left M2/MCA superior division branch with slow flow in the distal angular branch (TICI 2C). A flat panel CT of the head was then obtained. Moderate left sylvian subarachnoid hemorrhage was identified. The catheter was then removed. A right common femoral artery angiogram was obtained via sheath side port. A 6 French Perclose ProGlide was utilized for right, femoral access closure. Immediate hemostasis was achieved. Patient was transferred to ICU for continued monitoring. Family and neurology team communicated immediately after the end of the procedure. IMPRESSION: 1. Left M2/MCA superior division branch occlusion. 2. Successful mechanical thrombectomy/aspiration performed with TICI 2C final recanalization. A total of 3 stent retriever passes and 1 aspiration pass performed. 3. Moderate size left sylvian subarachnoid hemorrhage seen on postprocedure flat panel CT. PLAN: Continued monitoring in ICU level of care. Repeat head CT within 4 hours to evaluate for subarachnoid hemorrhage stability. Electronically Signed   By: Pedro Earls M.D.   On: 07/20/2019 09:27   DG Chest Port 1 View  Result Date: 07/21/2019 CLINICAL DATA:  Respiratory failure. Stroke. EXAM:  PORTABLE CHEST 1 VIEW COMPARISON:  One-view chest x-ray 07/19/2018 FINDINGS: Endotracheal tube is been pulled back, now terminating at the clavicles, well above the carina. Lung volumes remain low. Pulmonary vascular congestion is unchanged. IMPRESSION: 1. Repositioning of endotracheal tube now in satisfactory position. 2. Stable low lung volumes and moderate pulmonary vascular congestion. Electronically Signed   By: Harrell Gave  Mattern M.D.   On: 07/21/2019 08:36   DG Swallowing Func-Speech Pathology  Result Date: 07/24/2019 Objective Swallowing Evaluation: Type of Study: MBS-Modified Barium Swallow Study  Patient Details Name: Brenlynn Fake MRN: [id number] Date of Birth: 06/25/1945 Today's Date: 07/24/2019 Time: SLP Start Time (ACUTE ONLY): 0810 -SLP Stop Time (ACUTE ONLY): 0830 SLP Time Calculation (min) (ACUTE ONLY): 20 min Past Medical History: Past Medical History: Diagnosis Date  Hypertension   Thyroid disease  Past Surgical History: Past Surgical History: Procedure Laterality Date  IR CT HEAD LTD  07/20/2019  IR PERCUTANEOUS ART THROMBECTOMY/INFUSION INTRACRANIAL INC DIAG ANGIO  07/20/2019  RADIOLOGY WITH ANESTHESIA N/A 07/20/2019  Procedure: IR WITH ANESTHESIA;  Surgeon: Luanne Bras, MD;  Location: [place];  Service: Radiology;  Laterality: N/A; HPI: 74 yo F who presented with right sided weakness found to have acute left M2/ MCA occlusion s/p tPA and emergent thrombectomy which was complicated by moderate left sylvian SAH.  On 2/25 patient with Afib/ Aflutter with RVR and post conversion pauses. VDRF and was extubated am of 2/27.   No data recorded Assessment / Plan / Recommendation CHL IP CLINICAL IMPRESSIONS 07/24/2019 Clinical Impression Pt demonstrates a primary oral dysphagia with slow volitional initaition of swallow activities.  Paticular difficulty initiating and coordinating mastication, which was slow, but complete with mild right buccal residue.  Pt has mild right labial weakness with  anterior spillage with large quantitiy rapid self feeding. She is able to sip from a straw, and ultimately this was most effective, with assist, to control bolus size and rate. When self feeding pt will often take multiple sips with complete oral cohesion of the bolus before triggered posterior transit and swallow response. There is therefore typically piecemeal transit of a very large bolus. Pharyngeal phase generally is WNL except for some mild oral/base of tongue transit that accumulates in the valleculae. Pt only experienced one instance of sensed aspiration when allowed to very impulsively take independent cup sips, throwing head back and resulting in an instance of premature spillage. Cough response immediate, hard and effective. Will initiate a dys 1 (puree) diet with thin liquids with good potential for upgraded solids with therapy. Suspect automaticity will improve with intake.  SLP Visit Diagnosis Dysphagia, oropharyngeal phase (R13.12) Attention and concentration deficit following -- Frontal lobe and executive function deficit following -- Impact on safety and function --   CHL IP TREATMENT RECOMMENDATION 07/24/2019 Treatment Recommendations Therapy as outlined in treatment plan below   Prognosis 07/24/2019 Prognosis for Safe Diet Advancement Good Barriers to Reach Goals Language deficits Barriers/Prognosis Comment -- CHL IP DIET RECOMMENDATION 07/24/2019 SLP Diet Recommendations Dysphagia 1 (Puree) solids;Thin liquid Liquid Administration via Straw Medication Administration Crushed with puree Compensations Slow rate;Small sips/bites Postural Changes Seated upright at 90 degrees   CHL IP OTHER RECOMMENDATIONS 07/24/2019 Recommended Consults -- Oral Care Recommendations Oral care BID Other Recommendations --   CHL IP FOLLOW UP RECOMMENDATIONS 07/24/2019 Follow up Recommendations Inpatient Rehab   CHL IP FREQUENCY AND DURATION 07/24/2019 Speech Therapy Frequency (ACUTE ONLY) min 2x/week Treatment Duration 2 weeks       CHL IP ORAL PHASE 07/24/2019 Oral Phase Impaired Oral - Pudding Teaspoon -- Oral - Pudding Cup -- Oral - Honey Teaspoon -- Oral - Honey Cup -- Oral - Nectar Teaspoon Delayed oral transit;Piecemeal swallowing;Lingual/palatal residue Oral - Nectar Cup Delayed oral transit;Piecemeal swallowing;Lingual/palatal residue;Right anterior bolus loss Oral - Nectar Straw -- Oral - Thin Teaspoon -- Oral - Thin Cup Delayed oral transit;Piecemeal swallowing;Lingual/palatal residue  Oral - Thin Straw Delayed oral transit;Piecemeal swallowing;Lingual/palatal residue Oral - Puree Delayed oral transit;Piecemeal swallowing;Lingual/palatal residue Oral - Mech Soft Delayed oral transit;Piecemeal swallowing;Lingual/palatal residue;Right pocketing in lateral sulci;Impaired mastication Oral - Regular -- Oral - Multi-Consistency -- Oral - Pill -- Oral Phase - Comment --  CHL IP PHARYNGEAL PHASE 07/24/2019 Pharyngeal Phase Impaired Pharyngeal- Pudding Teaspoon -- Pharyngeal -- Pharyngeal- Pudding Cup -- Pharyngeal -- Pharyngeal- Honey Teaspoon -- Pharyngeal -- Pharyngeal- Honey Cup -- Pharyngeal -- Pharyngeal- Nectar Teaspoon WFL Pharyngeal -- Pharyngeal- Nectar Cup WFL Pharyngeal -- Pharyngeal- Nectar Straw -- Pharyngeal -- Pharyngeal- Thin Teaspoon -- Pharyngeal -- Pharyngeal- Thin Cup Penetration/Aspiration before swallow;Pharyngeal residue - valleculae Pharyngeal Material enters airway, passes BELOW cords then ejected out;Material does not enter airway Pharyngeal- Thin Straw WFL Pharyngeal -- Pharyngeal- Puree Pharyngeal residue - valleculae;Reduced tongue base retraction Pharyngeal -- Pharyngeal- Mechanical Soft -- Pharyngeal -- Pharyngeal- Regular Pharyngeal residue - valleculae;Reduced tongue base retraction Pharyngeal -- Pharyngeal- Multi-consistency -- Pharyngeal -- Pharyngeal- Pill -- Pharyngeal -- Pharyngeal Comment --  No flowsheet data found. Herbie [place], MA CCC-SLP Acute Rehabilitation Services Pager [phone] Office  [phone] Lynann Beaver 07/24/2019, 9:03 AM              ECHOCARDIOGRAM COMPLETE  Result Date: 07/20/2019    ECHOCARDIOGRAM REPORT   Patient Name:   ABEERA FLANNERY Date of Exam: 07/20/2019 Medical Rec #:  [id number]               Height:       62.0 in Accession #:    [id number]              Weight:       151.7 lb Date of Birth:  July 06, 1945              BSA:          1.700 m Patient Age:    41 years                BP:           124/68 mmHg Patient Gender: F                       HR:           79 bpm. Exam Location:  Inpatient Procedure: 2D Echo, Cardiac Doppler and Color Doppler STAT ECHO Indications:    CVA  History:        Patient has no prior history of Echocardiogram examinations. PAD                 and Stroke, Arrythmias:Atrial Fibrillation and Tachy-Brady; Risk                 Factors:Hypertension and Dyslipidemia.  Sonographer:    Dustin Flock Referring Phys: [place]  1. Left ventricular ejection fraction, by estimation, is 60 to 65%. The left ventricle has normal function. The left ventricle has no regional wall motion abnormalities. Left ventricular diastolic parameters were normal.  2. Right ventricular systolic function is normal. The right ventricular size is normal. Tricuspid regurgitation signal is inadequate for assessing PA pressure.  3. The mitral valve is normal in structure and function. Mild mitral valve regurgitation. No evidence of mitral stenosis.  4. The aortic valve is normal in structure and function. Aortic valve regurgitation is not visualized. No aortic stenosis is present.  5. The inferior vena cava is normal in size with greater than 50% respiratory variability,  suggesting right atrial pressure of 3 mmHg. FINDINGS  Left Ventricle: Left ventricular ejection fraction, by estimation, is 60 to 65%. The left ventricle has normal function. The left ventricle has no regional wall motion abnormalities. The left ventricular internal cavity size  was normal in size. There is  no left ventricular hypertrophy. Left ventricular diastolic parameters were normal. Normal left ventricular filling pressure. Right Ventricle: The right ventricular size is normal. No increase in right ventricular wall thickness. Right ventricular systolic function is normal. Tricuspid regurgitation signal is inadequate for assessing PA pressure. Left Atrium: Left atrial size was normal in size. Right Atrium: Right atrial size was normal in size. Pericardium: There is no evidence of pericardial effusion. Mitral Valve: The mitral valve is normal in structure and function. Normal mobility of the mitral valve leaflets. Mild mitral valve regurgitation. No evidence of mitral valve stenosis. Tricuspid Valve: The tricuspid valve is normal in structure. Tricuspid valve regurgitation is trivial. No evidence of tricuspid stenosis. Aortic Valve: The aortic valve is normal in structure and function. Aortic valve regurgitation is not visualized. No aortic stenosis is present. Pulmonic Valve: The pulmonic valve was normal in structure. Pulmonic valve regurgitation is not visualized. No evidence of pulmonic stenosis. Aorta: The aortic root is normal in size and structure. Venous: IVC assessment for right atrial pressure unable to be performed due to mechanical ventilation. The inferior vena cava is normal in size with greater than 50% respiratory variability, suggesting right atrial pressure of 3 mmHg. IAS/Shunts: No atrial level shunt detected by color flow Doppler.  LEFT VENTRICLE PLAX 2D LVIDd:         4.50 cm  Diastology LVIDs:         2.80 cm  LV e' lateral:   11.30 cm/s LV PW:         1.10 cm  LV E/e' lateral: 9.5 LV IVS:        1.20 cm  LV e' medial:    9.68 cm/s LVOT diam:     2.00 cm  LV E/e' medial:  11.1 LV SV:         61 LV SV Index:   36 LVOT Area:     3.14 cm  RIGHT VENTRICLE RV Basal diam:  2.90 cm RV S prime:     13.80 cm/s TAPSE (M-mode): 3.3 cm LEFT ATRIUM             Index        RIGHT ATRIUM           Index LA diam:        4.50 cm 2.65 cm/m  RA Area:     11.90 cm LA Vol (A2C):   45.3 ml 26.65 ml/m RA Volume:   26.60 ml  15.65 ml/m LA Vol (A4C):   40.5 ml 23.83 ml/m LA Biplane Vol: 45.7 ml 26.89 ml/m  AORTIC VALVE LVOT Vmax:   84.90 cm/s LVOT Vmean:  52.600 cm/s LVOT VTI:    0.195 m  AORTA Ao Root diam: 2.60 cm MITRAL VALVE MV Area (PHT): 3.37 cm     SHUNTS MV Decel Time: 225 msec     Systemic VTI:  0.20 m MV E velocity: 107.00 cm/s  Systemic Diam: 2.00 cm MV A velocity: 66.80 cm/s MV E/A ratio:  1.60 Fransico Him MD Electronically signed by Fransico Him MD Signature Date/Time: 07/20/2019/12:37:18 PM    Final    IR PERCUTANEOUS ART THROMBECTOMY/INFUSION INTRACRANIAL INC DIAG ANGIO  Result Date: 07/20/2019 INDICATION: 74 year old  female with past medical history significant for hypothyroidism presenting as a code stroke with right-sided hemiplegia and new onset atrial fibrillation. Her last known well was 21:50 on 07/19/2019. Head CT showed loss of gray-white differentiation in the left frontal for colon extending to the anterior aspect of the insula with no hemorrhage. CT angiogram showed a left M2/MCA/superior division branch occlusion. NIHSS 25. EXAM: Diagnostic cerebral angiogram Mechanical thrombectomy Flat panel head CT COMPARISON:  CT/CT angiogram of the head and neck July 19, 2019 MEDICATIONS: 5 mg of problem you have intra arterial to the left ICA. ANESTHESIA/SEDATION: General anesthesia CONTRAST:  75 mL FLUOROSCOPY TIME:  Fluoroscopy Time: 55 minutes 18 seconds (1,796 mGy). COMPLICATIONS: Moderate left sylvian subarachnoid hemorrhage TECHNIQUE: Informed written consent was obtained from the patient's son after a thorough discussion of the procedural risks, benefits and alternatives. All questions were addressed. Maximal Sterile Barrier Technique was utilized including caps, mask, sterile gowns, sterile gloves, sterile drape, hand hygiene and skin antiseptic. A timeout  was performed prior to the initiation of the procedure. Using a micropuncture kit and modified Seldinger technique, access was gained to the right common femoral artery and an 8 French sheath was placed in the right common femoral artery. Under fluoroscopy, an 8 [place] Walrus balloon guide catheter was navigated over a 6 range Berenstein 2 catheter and a 0.035 inch Terumo Glidewire into the aortic arch. Under fluoroscopy, the catheter was advanced into the cervical right internal carotid artery. The wire and 6 French catheter were removed and angiograms of the head were obtained in frontal and lateral views. FINDINGS: There is a left M2/MCA superior division branch occlusion. Prominent tortuosity of the cervical left ICA and left carotid siphon. PROCEDURE: A large bore catheter aspiration catheter was navigated through the balloon guide catheter and over a phenom 21 microcatheter and a synchro2 support microguidewire into the cavernous segment of the left ICA. The microcatheter was then advanced into the left M2/MCA superior division branch, distal to the point of occlusion. Frontal and lateral angiograms were obtained with microcatheter contrast injection. A 4 x 40 mm solitaire stent retriever was subsequently deployed spanning the distal left M1-mid M2. The device was allowed to intercalated with the clot for 4 minutes. The microcatheter was removed. The guiding catheter balloon was inflated and constant aspiration was performed, the aspiration catheter was navigated near the occlusion and connected to a penumbra aspiration pump. The stent retriever was then retracted along with the aspiration catheter. Frontal and lateral angiograms were obtained showing persistent left M2/MCA occlusion. In a similar fashion and using the same platform, a second stent retriever pass was performed with the solitaire device. Frontal and lateral angiograms were obtained showing persistent left M2/MCA occlusion with the clot  migrating proximally. A catalyst 6 aspiration catheter was navigated through the balloon guide catheter and over a phenom 21 microcatheter and posterior 0.014 inch Aristotle microguidewire into the cavernous segment of the left ICA. The microcatheter was then advanced into the left M2/MCA superior division branch, distal to the point of occlusion. Frontal and lateral angiograms were obtained with microcatheter contrast injection. A 4 x 41 mm trevo stent retriever was subsequently deployed spanning the distal left M1-mid M2. The device was allowed to intercalated with the clot for 4 minutes. The microcatheter was removed. The guiding catheter balloon was inflated and constant aspiration was performed, the aspiration catheter was navigated near the occlusion and connected to a penumbra aspiration pump. The stent retriever was then retracted along with the aspiration  catheter. Frontal and lateral angiograms were obtained showing distal contrast penetration into the left M2/MCA superior division branch with severe vasospasm. Intra arterial infusion of 5 mg of verapamil was performed over 10 minutes. Follow-up angiogram showed reocclusion of the left M2/MCA superior division branch. A 3 max aspiration catheter was navigated through the balloon guide catheter and over the Aristotle microguidewire into the left and 2/MCA superior division branch at the level of occlusion. Continuous is aspiration was performed for 4 minutes. The guiding catheter balloon was inflated and the aspiration catheter was then removed. Follow-up left ICA angiograms with frontal and lateral views showed recanalization of the left M2/MCA superior division branch with slow flow in the distal angular branch (TICI 2C). A flat panel CT of the head was then obtained. Moderate left sylvian subarachnoid hemorrhage was identified. The catheter was then removed. A right common femoral artery angiogram was obtained via sheath side port. A 6 French Perclose  ProGlide was utilized for right, femoral access closure. Immediate hemostasis was achieved. Patient was transferred to ICU for continued monitoring. Family and neurology team communicated immediately after the end of the procedure. IMPRESSION: 1. Left M2/MCA superior division branch occlusion. 2. Successful mechanical thrombectomy/aspiration performed with TICI 2C final recanalization. A total of 3 stent retriever passes and 1 aspiration pass performed. 3. Moderate size left sylvian subarachnoid hemorrhage seen on postprocedure flat panel CT. PLAN: Continued monitoring in ICU level of care. Repeat head CT within 4 hours to evaluate for subarachnoid hemorrhage stability. Electronically Signed   By: Pedro Earls M.D.   On: 07/20/2019 09:27   CT HEAD CODE STROKE WO CONTRAST  Result Date: 07/19/2019 CLINICAL DATA:  Code stroke. Initial evaluation for right-sided facial droop with right-sided weakness. EXAM: CT HEAD WITHOUT CONTRAST TECHNIQUE: Contiguous axial images were obtained from the base of the skull through the vertex without intravenous contrast. COMPARISON:  None. FINDINGS: Brain: Mild age-related cerebral atrophy. Small focus of encephalomalacia involving the cortical/subcortical high left frontal lobe consistent with a small chronic left MCA territory infarct (series 3, image 25). Few scattered parenchymal calcifications noted at the right frontal and left parietal region. There is subtle loss of gray-white matter differentiation involving the region of the left frontal operculum, suspicious for early/developing acute left MCA territory infarct. Left insula relatively well maintained at this time as are the deep gray nuclei. No intracranial hemorrhage or mass effect. No other acute large vessel territory infarct. No mass lesion or midline shift. No hydrocephalus. No extra-axial fluid collection. Vascular: Asymmetric hyperdensity seen M2 branch (series 6, image 45), suspicious for LV of.  Of distal left M1 and/or proximal Skull: Scalp soft tissues and calvarium within normal limits. Sinuses/Orbits: Left gaze noted. Globes and orbital soft tissues otherwise unremarkable. Mild mucosal thickening noted within the right sphenoid sinus. Chronic appearing left mastoid and middle ear effusion noted. Visualized nasopharynx within normal limits. Other: None. ASPECTS [facility] Stroke Program Early CT Score) - Ganglionic level infarction (caudate, lentiform nuclei, internal capsule, insula, M1-M3 cortex): 7 - Supraganglionic infarction (M4-M6 cortex): 2 Total score (0-10 with 10 being normal): 9 IMPRESSION: 1. Subtle loss of gray-white matter differentiation involving the left frontal operculum, concerning for acute left MCA territory infarct. Hyperdensity at the level of a distal left M1/proximal M2 branch concerning for LVO. Further assessment with dedicated CTA recommended. No intracranial hemorrhage. 2. ASPECTS is 9. 3. Small chronic high left frontal infarct. Critical Value/emergent results were called by telephone at the time of interpretation on 07/19/2019  at 11:10 pm to provider ERIC [facility] , who verbally acknowledged these results. Electronically Signed   By: Jeannine Boga M.D.   On: 07/19/2019 23:35       HISTORY OF PRESENT ILLNESS Aasiyah [name] is an 74 y.o. female presenting to the ED via EMS as a Code Stroke. LKW was 2150 on 07/19/2019. At 2215 family found patient to be completely flaccid on her right side, thrashing her left side and unable to talk. She was also looking to the left and had facial weakness. BP en route was 138/100, HR 78 with a-fib on the monitor. Of note, she has no documented history of a-fib in Epic. CBG 126 and temp 98. On arrival to the ED, the above deficits continued to be present. STAT CT head showed no hemorrhage. 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 Informed consent for tPA was obtained from her son. CTA was performed during tPA infusion, revealing a left M2 occlusion. She is on ASA and atorvastatin at home. She was taken to IR for mechanical thrombectomy.   HOSPITAL COURSE Ms. Ashtan [name] is a 74 y.o. female with history of HTN, HLD and thyroid disease presenting with R hemiparesis, Left gaze preference, R facial weakness, R field cut and global aphasia. AF noted by EMS. Received tPA 07/19/2019 at 2317. Found to have  L M2 occlusion and taken to IR.    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 Old high L frontal infarct. ASPECTS 9.   CTA head & neck L M2 LVO. Diffuse tortuosity head and neck. Fetal PCAs w/ small VB system.  Cerebral angio TICI2c revascularization LM2 superior division branch occlusion. Moderate L sylvian SAH  CT head post IR anterolateral L frontal lobe infarct in operculum, insula and subinsular region. Small to moderate L sylvian fissures into basal cisterns SAH.  Repeat CT head L sylvian fissure SAH less dense. L frontal operculum, insula and subinsular infarct well seen. Mild 1-67m midline shift. MRI /MRA 2/27 - Large acute left MCA superior division infarct with minimal petechial hemorrhage and unchanged minimal midline shift. Punctate acute left parietal and occipital infarcts. Residual small volume subarachnoid hemorrhage. 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 CT head 3/1 stable. Resolving SAH. No new infarcts 2D Echo EF 60-65%. No source of embolus  LDL 66 HgbA1c 6.0 UDS +benzos SCDs for VTE prophylaxis aspirin 81 mg daily prior to admission, now on No antithrombotic d/t focal SAH.  Repeat CT Head stable. Aspirin  added 3/1. Start eliquis 7-10 days post stroke.    Therapy recommendations:  CIR   Disposition:  CIR   Paroxysmal Atrial Fibrillation/Atrial Flutter w/ RVR and post conversion pauses TSH 6.781->7.052, free T4 normal 0.80  CHA2DS2-VASc Score = at least 5, ?2 oral anticoagulation recommended             Age in Years:  668-74   +1                    Sex:  Female   Female   +1  Hypertension History:  yes   +1                        Diabetes Mellitus:  0    Congestive Heart Failure History:  0             Vascular Disease History:  0                           Stroke/TIA/Thromboembolism History:  yes   +2 Cardiology on board, appreciate help On Amiodarone 200 mg BID per tube  Plan anticoagulation w/ Eliquis in 7-10 days post stroke    Acute Respiratory Failure Intubated for IR, left intubated post IR for airway protection Off Sedation  Extubated without difficulties  Hypotension Hx of hypertension Home meds:  HCTZ 25 Treated with Neo drip in ICU Current SBP goal < 160 given SAH  BP stable Long-term BP goal normotensive   Hyperlipidemia Home meds:  lipitor 40, resumed in hospital LDL 66, goal < 70 Continue statin at discharge   Acute renal failure, resolved Urinary retention,  > 1L with in and out  AKI likely postobstructive.  Creatinine 0.80 Foley catheter out Close BMP monitoring   Prediabetes HgbA1c 6.0, goal < 7.0 CBGs, SSI PCP follow up   Other Stroke Risk Factors Advanced age Overweight, Body mass index is 28.63 kg/m., recommend weight loss, diet and exercise as appropriate    Other Active Problems Hypothyroid on synthroid - TSH 6.781-> 7.052, free T4 normal 0.80   Vomited coffee ground emesis. PPI bid added. Hgb 13.4 - resolved  DISCHARGE EXAM Blood pressure 108/82, pulse 75, temperature 98.6 F (37 C), temperature source Oral, resp. rate 18, height 5' 2"  (1.575 m), weight 71 kg, SpO2 95 %. General -pleasant middle-aged Hispanic lady not  in distress Ophthalmologic - fundi not visualized due to noncooperation.   Cardiovascular - irregularly irregular heart rate and rhythm wheeze intermittent RVR.   Neurological Exam :  -Awake alert, eyes open, follows command bilaterally with visual cues and some simple voice commands, does speak a few words but appears to have nonfluent speech.  Left gaze preference, but able to cross midline with intermittent right gaze. Blinking to visual threat on the left but not on the right, PERRL.  Mild right lower facial weakness.  Tongue protrusion not cooperative. On pain stimulation, RUE 3+/5 with drift and RLE 3+/5. LUE and LLE spontaneous movement against gravity. DTR 1+ and toes equiv. Sensation, coordination and gait not tested.  Discharge Diet  Dysphagia 1 thin liquids  DISCHARGE PLAN Disposition:  Transfer to [facility] for ongoing PT, OT and ST aspirin 81 mg daily for secondary stroke prevention now. Add Eliquis 7-10 days post stroke when [facility] resolved.  Recommend ongoing stroke risk factor control by Primary Care Physician at time of discharge from inpatient rehabilitation. Follow-up PCP Alfonse Spruce, FNP in 2 weeks following discharge from rehab. Follow-up in [facility] in 4 weeks following discharge from rehab, office to schedule an appointment.  Follow-up Katyucia de Sindy Messing MD (Interventional Neuroradiologist) in 4 weeks following discharge from rehab Follow-up Oswaldo Milian, MD (cardiology)  35 minutes were spent preparing discharge.  Burnetta Sabin, MSN, APRN, ANVP-BC, AGPCNP-BC Advanced Practice Stroke Nurse [place] for Schedule & Pager information 07/25/2019 12:31 PM  I have personally obtained history,examined this patient, reviewed notes, independently viewed imaging studies, participated in  medical decision making and plan of care.ROS completed by me personally and pertinent  positives fully documented  I have made any additions or clarifications directly to the above note. Agree with note above.    Antony Contras, MD Medical Director [facility] Pager: [phone] 07/25/2019 1:01 PM

## 2019-07-25 NOTE — Progress Notes (Signed)
Jamie Ribas, MD  Physician  Physical Medicine and Rehabilitation  Consult Note  Signed  Date of Service:  07/24/2019 10:45 AM      Related encounter: ED to Hosp-Admission (Current) from 07/19/2019 in [facility]      Signed      Expand AllCollapse All   Show:Clear all [x] Manual[x] Template[] Copied  Added by: [x] Angiulli, Lavon Paganini, PA-C[x] Ranell Patrick, Clide Deutscher, MD  [] Hover for details          Physical Medicine and Rehabilitation Consult Reason for Consult: Right side weakness Referring Physician: Dr. Leonie Man     HPI: Jamie Mitchell is a 74 y.o. limited English speaking right-handed female with history of hypertension as well as hypothyroidism.  Per chart review patient lives alone in [place] was here visiting family in the area for the last 6 months with her son.  Mobile home 7 steps to entry.  Presented 07/19/2019 with right-sided weakness.  Cranial CT scan showed subtle loss of gray-white matter differentiation involving the left frontal operculum concerning for acute left MCA territory infarction.  Small chronic high left frontal infarction.  Patient did receive TPA.  CT angiogram of head and neck positive for emergent large vessel occlusion with occlusive thrombus within proximal left M2 branch superior division.  Patient underwent revascularization per interventional radiology remain intubated for a short time extubated 07/23/2019.  Echocardiogram with ejection fraction 65% without emboli.  Cardiology follow-up for bouts of atrial fibrillation and flutter and currently maintained on amiodarone.  Dysphagia #1 thin liquid diet.  Nasogastric tube have been in place for nutritional support.  Therapy evaluations completed with recommendations of physical medicine rehab consult.     Review of Systems  Unable to perform ROS: Language        Past Medical History:  Diagnosis Date  . Hypertension    . Thyroid disease           Past Surgical History:    Procedure Laterality Date  . IR CT HEAD LTD   07/20/2019  . IR PERCUTANEOUS ART THROMBECTOMY/INFUSION INTRACRANIAL INC DIAG ANGIO   07/20/2019  . RADIOLOGY WITH ANESTHESIA N/A 07/20/2019    Procedure: IR WITH ANESTHESIA;  Surgeon: Luanne Bras, MD;  Location: [facility];  Service: Radiology;  Laterality: N/A;    Family History  Family history unknown: Yes    Social History:  reports that she has never smoked. She has never used smokeless tobacco. She reports that she does not drink alcohol or use drugs.    Allergies: No Known Allergies          Medications Prior to Admission  Medication Sig Dispense Refill  . amLODipine (NORVASC) 5 MG tablet Take 5 mg by mouth daily.      . ASPIRIN PO Take 150 mg by mouth at bedtime.      [name] atorvastatin (LIPITOR) 20 MG tablet Take 20 mg by mouth daily.      [name] levothyroxine (SYNTHROID) 100 MCG tablet Take 50-75 mcg by mouth See admin instructions. 30mcg one day, 38mcg the next day, then repeat.      . pentoxifylline (TRENTAL) 400 MG CR tablet Take 400 mg by mouth daily.          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 Pt needed cues and assist to be aware of right UE.  Question if  pt has inattention vs neglect of right hemibody.  Transfers Overall transfer level: Needs assistance Equipment used: Rolling walker (2 wheeled) Transfers: Sit to/from Stand Sit to Stand: Min assist, Mod assist General transfer comment: Cues for hand placement and assist to power up. Needed assist to place right hand on RW and to keep it in place.  Ambulation/Gait Ambulation/Gait assistance: Min assist, +2 safety/equipment, Mod assist Gait Distance (Feet): 45 Feet Assistive device: Rolling walker (2 wheeled) Gait Pattern/deviations: Step-through pattern, Decreased stride length, Drifts right/left, Ataxic General Gait Details: Pt was able to walk to door and back needing assit to keep right hand on RW, to steer RW and for step sequencing.  Overall fairly steady with some incoordination of right LE as well.   Gait velocity interpretation: <1.31 ft/sec, indicative of household ambulator   ADL:   Cognition: Cognition Overall Cognitive Status: Impaired/Different from baseline Orientation Level: Oriented to person Cognition Arousal/Alertness: Awake/alert Behavior During Therapy: Flat affect Overall Cognitive Status: Impaired/Different from baseline Area of Impairment: Following commands, Safety/judgement, Problem solving Following Commands: Follows one step commands inconsistently, Follows one step commands with increased time Safety/Judgement: Decreased awareness of safety, Decreased awareness of deficits Problem Solving: Slow processing, Decreased initiation, Difficulty sequencing, Requires verbal cues, Requires tactile cues General Comments: Pt with expressive aphasia and questionable receptive aphasia as well therefore difficult to determine cognition due to this and language barrier.    Blood pressure 112/79, pulse 61, temperature 99.6 F (37.6 C), temperature source Oral, resp. rate 18, height 5\' 2"  (1.575 m), weight 71 kg, SpO2 98 %.     Physical Exam  General: No apparent  distress, lethargic.  HEENT: Head is normocephalic, atraumatic, PERRLA, EOMI, sclera anicteric, oral mucosa pink and moist, dentition intact, ext ear canals clear  Neck: Supple without JVD or lymphadenopathy Heart: Reg rate and rhythm. No murmurs rubs or gallops Chest: CTA bilaterally without wheezes, rales, or rhonchi; no distress Abdomen: Soft, non-tender, non-distended, bowel sounds positive. Extremities: No clubbing, cyanosis, or edema. Pulses are 2+ Skin: Clean and intact without signs of breakdown Neurological:  Alert no acute distress. Oriented x1 (self only). Left gaze preference. R facial droop. Globally aphasic. Follows demonstrated commands.  Exam limited by language barrier . 4/5 diffusely.  Psych: Pt's affect is appropriate. Pt is cooperative, lethargic but easily arousable.      Lab Results Last 24 Hours       Results for orders placed or performed during the hospital encounter of 07/19/19 (from the past 24 hour(s))  Glucose, capillary     Status: Abnormal    Collection Time: 07/23/19 11:46 AM  Result Value Ref Range    Glucose-Capillary 123 (H) 70 - 99 mg/dL    Comment 1 Notify RN      Comment 2 Document in Chart    Glucose, capillary     Status: Abnormal    Collection Time: 07/23/19  4:13 PM  Result Value Ref Range    Glucose-Capillary 136 (H) 70 -  99 mg/dL  Glucose, capillary     Status: Abnormal    Collection Time: 07/23/19  8:06 PM  Result Value Ref Range    Glucose-Capillary 133 (H) 70 - 99 mg/dL    Comment 1 Notify RN      Comment 2 Document in Chart    Glucose, capillary     Status: Abnormal    Collection Time: 07/23/19 11:46 PM  Result Value Ref Range    Glucose-Capillary 131 (H) 70 - 99 mg/dL    Comment 1 Notify RN      Comment 2 Document in Chart    Glucose, capillary     Status: Abnormal    Collection Time: 07/24/19  4:08 AM  Result Value Ref Range    Glucose-Capillary 128 (H) 70 - 99 mg/dL    Comment 1 Notify RN      Comment 2 Document in Chart      CBC     Status: None    Collection Time: 07/24/19  5:29 AM  Result Value Ref Range    WBC 6.1 4.0 - 10.5 K/uL    RBC 4.09 3.87 - 5.11 MIL/uL    Hemoglobin 12.1 12.0 - 15.0 g/dL    HCT 37.4 36.0 - 46.0 %    MCV 91.4 80.0 - 100.0 fL    MCH 29.6 26.0 - 34.0 pg    MCHC 32.4 30.0 - 36.0 g/dL    RDW 13.5 11.5 - 15.5 %    Platelets 192 150 - 400 K/uL    nRBC 0.0 0.0 - 0.2 %  Basic metabolic panel     Status: Abnormal    Collection Time: 07/24/19  5:29 AM  Result Value Ref Range    Sodium 140 135 - 145 mmol/L    Potassium 3.6 3.5 - 5.1 mmol/L    Chloride 101 98 - 111 mmol/L    CO2 29 22 - 32 mmol/L    Glucose, Bld 135 (H) 70 - 99 mg/dL    BUN 24 (H) 8 - 23 mg/dL    Creatinine, Ser 0.86 0.44 - 1.00 mg/dL    Calcium 8.4 (L) 8.9 - 10.3 mg/dL    GFR calc non Af Amer >60 >60 mL/min    GFR calc Af Amer >60 >60 mL/min    Anion gap 10 5 - 15  Glucose, capillary     Status: Abnormal    Collection Time: 07/24/19  9:01 AM  Result Value Ref Range    Glucose-Capillary 156 (H) 70 - 99 mg/dL       Imaging Results (Last 48 hours)  CT HEAD WO CONTRAST   Result Date: 07/24/2019 CLINICAL DATA:  Stroke, follow-up. Revascularization of left M2 division. EXAM: CT HEAD WITHOUT CONTRAST TECHNIQUE: Contiguous axial images were obtained from the base of the skull through the vertex without intravenous contrast. COMPARISON:  CT head without contrast 07/20/2019. MR head without contrast 07/22/2019 FINDINGS: Brain: Left frontal operculum nonhemorrhagic infarct is stable. Punctate infarcts of the left parietal lobe are not appreciated by CT. Trace blood in the left sylvian fissure is resolving. Mass effect with 3 mm midline shift is noted. Ventricles are otherwise normal. No new infarcts are present. The brainstem and cerebellum are normal. Scattered parenchymal calcifications are again seen. Vascular: No hyperdense vessel or unexpected calcification. Skull: No significant extracranial soft tissue lesion is present.  Calvarium is intact. No focal lytic or blastic lesions are present. Sinuses/Orbits: Chronic left middle ear and mastoid effusion is present. Paranasal  sinuses and right mastoid air cells are clear. The globes and orbits are within normal limits. IMPRESSION: 1. Stable left frontal operculum nonhemorrhagic infarct. 2. Resolving subarachnoid blood in the left Sylvian fissure. 3. No new infarcts. 4. Chronic left middle ear and mastoid effusion. Electronically Signed   By: San Morelle M.D.   On: 07/24/2019 06:27    MR ANGIO HEAD WO CONTRAST   Result Date: 07/22/2019 CLINICAL DATA:  Stroke follow-up. Proximal left M2 occlusion status post revascularization. EXAM: MRI HEAD WITHOUT CONTRAST MRA HEAD WITHOUT CONTRAST TECHNIQUE: Multiplanar, multiecho pulse sequences of the brain and surrounding structures were obtained without intravenous contrast. Angiographic images of the head were obtained using MRA technique without contrast. COMPARISON:  Head CT 07/20/2019 and CTA 07/19/2019 FINDINGS: MRI HEAD FINDINGS The study is mildly motion degraded. Brain: A large acute infarct is again noted in the left MCA superior division involving the lateral and anterior aspects of the frontal lobe and insula with cytotoxic edema, minimal petechial hemorrhage, and unchanged trace rightward midline shift. Scattered punctate acute infarcts are also present in the left parietal and superior occipital lobes. Susceptibility artifact in the left sylvian fissure and FLAIR signal within multiple sulci of the posterior left greater than right cerebral hemispheres is compatible with previously demonstrated small volume subarachnoid hemorrhage. Several scattered chronic microhemorrhages are noted peripherally in both cerebral hemispheres. There is no extra-axial fluid collection. Vascular: Major intracranial vascular flow voids are preserved. Skull and upper cervical spine: Unremarkable bone marrow signal. Sinuses/Orbits: Unremarkable  orbits. Moderate left mastoid effusion. Clear paranasal sinuses. Other: None. MRA HEAD FINDINGS The visualized distal vertebral arteries are widely patent to the basilar and codominant. Patent AICAs and SCA is are seen bilaterally. The basilar artery is widely patent. There are large posterior communicating arteries bilaterally with hypoplastic right and absent left P1 segments. Both PCAs are patent without evidence of significant proximal stenosis. The internal carotid arteries are widely patent from skull base to carotid termini with ectasias of both cavernous segments. ACAs and MCAs are patent without evidence of significant A1 or M1 stenosis. The proximal left M2 superior division remains patent following revascularization, however there is a severe stenosis near/slightly distal to the site of previous occlusion. There is also asymmetric attenuation of more distal branch vessels in the left MCA superior division. No aneurysm is identified. IMPRESSION: 1. 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 2. Punctate acute left parietal and occipital infarcts. 3. Residual small volume subarachnoid hemorrhage. 4. 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 Electronically Signed   By: Logan Bores M.D.   On: 07/22/2019 18:25    MR BRAIN WO CONTRAST   Result Date: 07/22/2019 CLINICAL DATA:  Stroke follow-up. Proximal left M2 occlusion status post revascularization. EXAM: MRI HEAD WITHOUT CONTRAST MRA HEAD WITHOUT CONTRAST TECHNIQUE: Multiplanar, multiecho pulse sequences of the brain and surrounding structures were obtained without intravenous contrast. Angiographic images of the head were obtained using MRA technique without contrast. COMPARISON:  Head CT 07/20/2019 and CTA 07/19/2019 FINDINGS: MRI HEAD FINDINGS The study is mildly motion degraded. Brain: A large acute infarct is again noted in  the left MCA superior division involving the lateral and anterior aspects of the frontal lobe and insula with cytotoxic edema, minimal petechial hemorrhage, and unchanged trace rightward midline shift. Scattered punctate acute infarcts are also present in the left parietal and superior occipital lobes. Susceptibility artifact in the left  sylvian fissure and FLAIR signal within multiple sulci of the posterior left greater than right cerebral hemispheres is compatible with previously demonstrated small volume subarachnoid hemorrhage. Several scattered chronic microhemorrhages are noted peripherally in both cerebral hemispheres. There is no extra-axial fluid collection. Vascular: Major intracranial vascular flow voids are preserved. Skull and upper cervical spine: Unremarkable bone marrow signal. Sinuses/Orbits: Unremarkable orbits. Moderate left mastoid effusion. Clear paranasal sinuses. Other: None. MRA HEAD FINDINGS The visualized distal vertebral arteries are widely patent to the basilar and codominant. Patent AICAs and SCA is are seen bilaterally. The basilar artery is widely patent. There are large posterior communicating arteries bilaterally with hypoplastic right and absent left P1 segments. Both PCAs are patent without evidence of significant proximal stenosis. The internal carotid arteries are widely patent from skull base to carotid termini with ectasias of both cavernous segments. ACAs and MCAs are patent without evidence of significant A1 or M1 stenosis. The proximal left M2 superior division remains patent following revascularization, however there is a severe stenosis near/slightly distal to the site of previous occlusion. There is also asymmetric attenuation of more distal branch vessels in the left MCA superior division. No aneurysm is identified. IMPRESSION: 1. 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 2. Punctate acute left  parietal and occipital infarcts. 3. Residual small volume subarachnoid hemorrhage. 4. 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 Electronically Signed   By: Logan Bores M.D.   On: 07/22/2019 18:25    DG Swallowing Func-Speech Pathology   Result Date: 07/24/2019 Objective Swallowing Evaluation: Type of Study: MBS-Modified Barium Swallow Study  Patient Details Name: Jamie Mitchell MRN: [id number] Date of Birth: 1946-05-02 Today's Date: 07/24/2019 Time: SLP Start Time (ACUTE ONLY): 0810 -SLP Stop Time (ACUTE ONLY): 0830 SLP Time Calculation (min) (ACUTE ONLY): 20 min Past Medical History: Past Medical History: Diagnosis Date . Hypertension  . Thyroid disease  Past Surgical History: Past Surgical History: Procedure Laterality Date . IR CT HEAD LTD  07/20/2019 . IR PERCUTANEOUS ART THROMBECTOMY/INFUSION INTRACRANIAL INC DIAG ANGIO  07/20/2019 . RADIOLOGY WITH ANESTHESIA N/A 07/20/2019  Procedure: IR WITH ANESTHESIA;  Surgeon: Luanne Bras, MD;  Location: [facility];  Service: Radiology;  Laterality: N/A; HPI: 74 yo F who presented with right sided weakness found to have acute left M2/ MCA occlusion s/p tPA and emergent thrombectomy which was complicated by moderate left sylvian SAH.  On 2/25 patient with Afib/ Aflutter with RVR and post conversion pauses. VDRF and was extubated am of 2/27.   No data recorded Assessment / Plan / Recommendation CHL IP CLINICAL IMPRESSIONS 07/24/2019 Clinical Impression Pt demonstrates a primary oral dysphagia with slow volitional initaition of swallow activities.  Paticular difficulty initiating and coordinating mastication, which was slow, but complete with mild right buccal residue.  Pt has mild right labial weakness with anterior spillage with large quantitiy rapid self feeding. She is able to sip from a straw, and ultimately this was most effective, with assist, to control bolus size and rate. When self feeding pt will often take  multiple sips with complete oral cohesion of the bolus before triggered posterior transit and swallow response. There is therefore typically piecemeal transit of a very large bolus. Pharyngeal phase generally is WNL except for some mild oral/base of tongue transit that accumulates in the valleculae. Pt only experienced one instance of sensed aspiration when allowed to very impulsively take independent cup sips, throwing head  back and resulting in an instance of premature spillage. Cough response immediate, hard and effective. Will initiate a dys 1 (puree) diet with thin liquids with good potential for upgraded solids with therapy. Suspect automaticity will improve with intake.  SLP Visit Diagnosis Dysphagia, oropharyngeal phase (R13.12) Attention and concentration deficit following -- Frontal lobe and executive function deficit following -- Impact on safety and function --   CHL IP TREATMENT RECOMMENDATION 07/24/2019 Treatment Recommendations Therapy as outlined in treatment plan below   Prognosis 07/24/2019 Prognosis for Safe Diet Advancement Good Barriers to Reach Goals Language deficits Barriers/Prognosis Comment -- CHL IP DIET RECOMMENDATION 07/24/2019 SLP Diet Recommendations Dysphagia 1 (Puree) solids;Thin liquid Liquid Administration via Straw Medication Administration Crushed with puree Compensations Slow rate;Small sips/bites Postural Changes Seated upright at 90 degrees   CHL IP OTHER RECOMMENDATIONS 07/24/2019 Recommended Consults -- Oral Care Recommendations Oral care BID Other Recommendations --   CHL IP FOLLOW UP RECOMMENDATIONS 07/24/2019 Follow up Recommendations Inpatient Rehab   CHL IP FREQUENCY AND DURATION 07/24/2019 Speech Therapy Frequency (ACUTE ONLY) min 2x/week Treatment Duration 2 weeks      CHL IP ORAL PHASE 07/24/2019 Oral Phase Impaired Oral - Pudding Teaspoon -- Oral - Pudding Cup -- Oral - Honey Teaspoon -- Oral - Honey Cup -- Oral - Nectar Teaspoon Delayed oral transit;Piecemeal  swallowing;Lingual/palatal residue Oral - Nectar Cup Delayed oral transit;Piecemeal swallowing;Lingual/palatal residue;Right anterior bolus loss Oral - Nectar Straw -- Oral - Thin Teaspoon -- Oral - Thin Cup Delayed oral transit;Piecemeal swallowing;Lingual/palatal residue Oral - Thin Straw Delayed oral transit;Piecemeal swallowing;Lingual/palatal residue Oral - Puree Delayed oral transit;Piecemeal swallowing;Lingual/palatal residue Oral - Mech Soft Delayed oral transit;Piecemeal swallowing;Lingual/palatal residue;Right pocketing in lateral sulci;Impaired mastication Oral - Regular -- Oral - Multi-Consistency -- Oral - Pill -- Oral Phase - Comment --  CHL IP PHARYNGEAL PHASE 07/24/2019 Pharyngeal Phase Impaired Pharyngeal- Pudding Teaspoon -- Pharyngeal -- Pharyngeal- Pudding Cup -- Pharyngeal -- Pharyngeal- Honey Teaspoon -- Pharyngeal -- Pharyngeal- Honey Cup -- Pharyngeal -- Pharyngeal- Nectar Teaspoon WFL Pharyngeal -- Pharyngeal- Nectar Cup WFL Pharyngeal -- Pharyngeal- Nectar Straw -- Pharyngeal -- Pharyngeal- Thin Teaspoon -- Pharyngeal -- Pharyngeal- Thin Cup Penetration/Aspiration before swallow;Pharyngeal residue - valleculae Pharyngeal Material enters airway, passes BELOW cords then ejected out;Material does not enter airway Pharyngeal- Thin Straw WFL Pharyngeal -- Pharyngeal- Puree Pharyngeal residue - valleculae;Reduced tongue base retraction Pharyngeal -- Pharyngeal- Mechanical Soft -- Pharyngeal -- Pharyngeal- Regular Pharyngeal residue - valleculae;Reduced tongue base retraction Pharyngeal -- Pharyngeal- Multi-consistency -- Pharyngeal -- Pharyngeal- Pill -- Pharyngeal -- Pharyngeal Comment --  No flowsheet data found. Herbie Baltimore, MA CCC-SLP Acute Rehabilitation Services Pager [phone] Office [phone] Lynann Beaver 07/24/2019, 9:03 AM                    Assessment/Plan: Diagnosis: Impaired mobility and ADLs following L MCA infarct 1. Does the need for close, 24 hr/day medical  supervision in concert with the patient's rehab needs make it unreasonable for this patient to be served in a less intensive setting? Yes 2. Co-Morbidities requiring supervision/potential complications: L frontal operculum MCA infarct, HTN, HLD, thyroid disease 3. Due to bladder management, bowel management, safety, skin/wound care, disease management, medication administration, pain management and patient education, does the patient require 24 hr/day rehab nursing? Yes 4. Does the patient require coordinated care of a physician, rehab nurse, therapy disciplines of PT,OT, SLP to address physical and functional deficits in the context of the above medical diagnosis(es)? Yes Addressing deficits in the following areas: balance,  endurance, locomotion, strength, transferring, bowel/bladder control, bathing, dressing, feeding, grooming, toileting, cognition, speech, language, swallowing and psychosocial support 5. Can the patient actively participate in an intensive therapy program of at least 3 hrs of therapy per day at least 5 days per week? Yes 6. The potential for patient to make measurable gains while on inpatient rehab is excellent 7. Anticipated functional outcomes upon discharge from inpatient rehab are min assist  with PT, min assist with OT, min assist with SLP. 8. Estimated rehab length of stay to reach the above functional goals is: 16-20 days 9. Anticipated discharge destination: Home 10. Overall Rehab/Functional Prognosis: excellent   RECOMMENDATIONS: This patient's condition is appropriate for continued rehabilitative care in the following setting: CIR Patient has agreed to participate in recommended program. Yes Note that insurance prior authorization may be required for reimbursement for recommended care.   Comment: Jamie Mitchell would be an excellent CIR candidate. Her son at bedside is very supportive and his family will be caring for her. His wife does not work and will be with her  24/7.    Lavon Paganini Angiulli, PA-C 07/24/2019    I have personally performed a face to face diagnostic evaluation, including, but not limited to relevant history and physical exam findings, of this patient and developed relevant assessment and plan.  Additionally, I have reviewed and concur with the physician assistant's documentation above.   Leeroy Cha, MD        Revision History                     Routing History

## 2019-07-25 NOTE — Progress Notes (Signed)
Cristina Gong, RN  Rehab Admission Coordinator  Physical Medicine and Rehabilitation  PMR Pre-admission  Signed  Date of Service:  07/24/2019  4:29 PM      Related encounter: ED to Hosp-Admission (Current) from 07/19/2019 in [facility]      Signed        Show:Clear all [x] Manual[x] Template[x] Copied  Added by: [x] Cristina Gong, RN  [] Hover for details PMR Admission Coordinator Pre-Admission Assessment   Patient: Jamie Mitchell is an 74 y.o., female MRN: [id number] DOB: 02/25/1946 Height: 5\' 2"  (157.5 cm) Weight: 71 kg                                                                                                                                                  Insurance Information HMO:     PPO:      PCP:      IPA:      80/20:      OTHER:  PRIMARY: Medicare a and b      Policy#: [id number]      Subscriber: pt Benefits:  Phone #: passport one online     Name: 3/1 Eff. Date: a 08/24/2015 and b 11/22/2016     Deduct: $1484      Out of Pocket Max: none      Life Max: none CIR: 100%      SNF: 20 full days Outpatient: 80%     Co-Pay: 20% Home Health: 100%      Co-Pay: none DME: 80%     Co-Pay: 20% Providers: pt choice   Patient is a permanent resident  SECONDARY:  Medicaid of [place] [id number] t   3/2 per passport one not currently active   Medicaid Application Date:       Case Manager:  Disability Application Date:       Case Worker:    The "Data Collection Information Summary" for patients in Inpatient Rehabilitation Facilities with attached "Privacy Act [place] Records" was provided and verbally reviewed with: Patient and family   Emergency Contact Information         Contact Information     Name Relation Home Work Mobile    [place] Son [phone]           Current Medical History  Patient Admitting Diagnosis: CVA  History of Present Illness:  Jamie Mitchell is a 74 y.o. limited English speaking right-handed  female with history of hypertension as well as hypothyroidism.  Per chart review patient lives alone in [place] was here visiting family in the area for the last 6 months with her son.  Mobile home 7 steps to entry.  Presented 07/19/2019 with right-sided weakness.  Cranial CT scan showed subtle loss of gray-white matter differentiation involving the left frontal operculum concerning for acute left MCA territory infarction.  Small chronic high left  frontal infarction.  Patient did receive TPA.  CT angiogram of head and neck positive for emergent large vessel occlusion with occlusive thrombus within proximal left M2 branch superior division.  Patient underwent revascularization per interventional radiology remain intubated for a short time extubated 07/23/2019.  Echocardiogram with ejection fraction 65% without emboli.  Cardiology follow-up for bouts of atrial fibrillation and flutter and currently maintained on amiodarone.  Dysphagia #1 thin liquid diet.  Nasogastric tube have been in place for nutritional support and to be discontinued today per Dr. Leonie Man  Therapy evaluations completed with recommendations of physical medicine rehab consult.   Complete NIHSS TOTAL: 7 Glasgow Coma Scale Score: 15   Past Medical History      Past Medical History:  Diagnosis Date  . Hypertension    . Thyroid disease        Family History  Family history is unknown by patient.   Prior Rehab/Hospitalizations:  Has the patient had prior rehab or hospitalizations prior to admission? Yes   Has the patient had major surgery during 100 days prior to admission? No   Current Medications    Current Facility-Administered Medications:  .  [COMPLETED] alteplase (ACTIVASE) 1 mg/mL infusion 61.9 mg, 0.9 mg/kg, Intravenous, Once, Stopped at 07/20/19 2142 **FOLLOWED BY** 0.9 %  sodium chloride infusion, 50 mL, Intravenous, Once, Kerney Elbe, MD .  0.9 %  sodium chloride infusion, 250 mL, Intravenous, Continuous, Oletta Darter Virgina Evener, MD .  acetaminophen (TYLENOL) tablet 650 mg, 650 mg, Oral, Q4H PRN, 650 mg at 07/23/19 2122 **OR** acetaminophen (TYLENOL) 160 MG/5ML solution 650 mg, 650 mg, Per Tube, Q4H PRN, 650 mg at 07/23/19 0954 **OR** acetaminophen (TYLENOL) suppository 650 mg, 650 mg, Rectal, Q4H PRN, Kerney Elbe, MD .  amiodarone (PACERONE) tablet 200 mg, 200 mg, Per Tube, BID, Shirley Friar, PA-C, 200 mg at 07/25/19 0854 .  aspirin EC tablet 81 mg, 81 mg, Oral, Daily, Biby, Sharon L, NP, 81 mg at 07/25/19 0854 .  atorvastatin (LIPITOR) tablet 40 mg, 40 mg, Per Tube, Daily, Rosalin Hawking, MD, 40 mg at 07/25/19 0854 .  atropine 1 MG/10ML injection 1 mg, 1 mg, Intravenous, Once, Anders Simmonds, MD .  chlorhexidine (PERIDEX) 0.12 % solution 15 mL, 15 mL, Mouth Rinse, BID, Rosalin Hawking, MD, 15 mL at 07/25/19 0855 .  Chlorhexidine Gluconate Cloth 2 % PADS 6 each, 6 each, Topical, Daily, Rosalin Hawking, MD, 6 each at 07/24/19 1041 .  feeding supplement (ENSURE ENLIVE) (ENSURE ENLIVE) liquid 237 mL, 237 mL, Oral, BID BM, Garvin Fila, MD, 237 mL at 07/24/19 1526 .  feeding supplement (OSMOLITE 1.2 CAL) liquid 1,000 mL, 1,000 mL, Per Tube, Q24H, Garvin Fila, MD, Last Rate: 40 mL/hr at 07/24/19 1734, 1,000 mL at 07/24/19 1734 .  insulin aspart (novoLOG) injection 0-9 Units, 0-9 Units, Subcutaneous, Q4H, Jacalyn Lefevre, MD, 1 Units at 07/25/19 0854 .  levothyroxine (SYNTHROID, LEVOTHROID) injection 37.5 mcg, 37.5 mcg, Intravenous, Daily, Jennelle Human B, NP, 37.5 mcg at 07/25/19 0906 .  MEDLINE mouth rinse, 15 mL, Mouth Rinse, q12n4p, Rosalin Hawking, MD, 15 mL at 07/24/19 1735 .  pantoprazole (PROTONIX) injection 40 mg, 40 mg, Intravenous, Q24H, Simpson, Paula B, NP, 40 mg at 07/24/19 2216 .  senna-docusate (Senokot-S) tablet 1 tablet, 1 tablet, Oral, QHS PRN, Kerney Elbe, MD .  sodium chloride flush (NS) 0.9 % injection 10-40 mL, 10-40 mL, Intracatheter, PRN, Rosalin Hawking, MD   Patients Current Diet:     Diet  Order  DIET - DYS 1 Room service appropriate? 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  Independent   Indoor Mobility: Did the patient need assistance with walking from room to room (with or without device)? Independent   Stairs: Did the patient need assistance with internal or external stairs (with or without device)? Independent   Functional Cognition: Did the patient need help planning regular tasks such as shopping or remembering to take medications? Independent   Home Assistive Devices / Equipment Home Equipment: None   Prior Device Use: Indicate devices/aids used by the patient prior to current illness, exacerbation or injury? None of the above   Current Functional Level Cognition   Overall Cognitive Status: Impaired/Different from baseline Current Attention Level: Sustained Orientation Level: Oriented to person, Other (comment)(Pt speaks Spanish and has expressive aphagia) Following Commands: Follows one step commands with increased time, Follows one step commands inconsistently Safety/Judgement: Decreased awareness of safety, Decreased awareness of deficits General Comments: Pt with expressive aphasia and questionable receptive aphasia superimposed on non-English Speaking (Spanish).  Pt will follow a basic command, but has difficulty attending to her right side without multimodal cues, cues and assist for safety with RW  use, eating (which she was doing with son when we entered the room).     Extremity Assessment (includes Sensation/Coordination)   Upper Extremity Assessment: RUE deficits/detail RUE Deficits / Details: AROM WFL; grossly 3+/5 throught. Decreased in hand manipulation skills and hand wekaness; [place] brush teeth using R hand RUE Coordination: decreased fine motor, decreased gross motor  Lower Extremity Assessment: Defer to PT evaluation     ADLs   Overall ADL's : Needs assistance/impaired Eating/Feeding: Minimal assistance Eating/Feeding Details (indicate cue type and reason): spillage R side of mouth Grooming: Minimal assistance Upper Body Bathing: Minimal assistance, Sitting Lower Body Bathing: Moderate assistance, Sit to/from stand Upper Body Dressing : Moderate assistance, Sitting Lower Body Dressing: Moderate assistance, Sit to/from stand Toilet Transfer: Minimal assistance, Ambulation Toileting- Clothing Manipulation and Hygiene: Minimal assistance, Sit to/from stand Functional mobility during ADLs: Minimal assistance, Cueing for safety General ADL Comments: Pt issued lidded cup. Does better holding handled cup adn using straw. Continues to have spillage but pt inititaing bringing cloth to mouth     Mobility   Overal bed mobility: Needs Assistance Bed Mobility: Supine to Sit Supine to sit: Min assist General bed mobility comments: min assist to support trunk during transitions for balance.  Cues to scoot out to the edge of the bed once seated and not to go immediatly to standing.      Transfers   Overall transfer level: Needs assistance Equipment used: Rolling walker (2 wheeled) Transfers: Sit to/from Stand Sit to Stand: Min assist General transfer comment: Min assist to help power up, stabilize both hands on RW.  Hand over hand assist for R hand use on RW and for transitions sit/stand.  Ambulation / Gait / Stairs / Wheelchair Mobility   Ambulation/Gait Ambulation/Gait  assistance: Min assist, +2 safety/equipment Gait Distance (Feet): 85 Feet Assistive device: Rolling walker (2 wheeled) Gait Pattern/deviations: Step-through pattern, Decreased step length - right General Gait Details: Does not attend to obstacles on her right, difficult to get to look right and turn right in the hallway, so we practiced looking and turning right.  Fatigues quickly, so we did not make it far down the hallway.   Gait velocity: too fast to be safe, cues for slowing speed.  Gait velocity interpretation: <1.31 ft/sec, indicative of household ambulator     Posture / Balance Dynamic Sitting Balance Sitting balance - Comments: close supervision EOB Balance Overall balance assessment: Needs assistance Sitting-balance support: Feet supported, Bilateral upper extremity supported Sitting balance-Leahy Scale: Fair Sitting balance - Comments: close supervision EOB Standing balance support: Bilateral upper extremity supported Standing balance-Leahy Scale: Poor Standing balance comment: needs external assist in standing for balance      Special needs/care consideration BiPAP/CPAP CPM Continuous Drip IV Dialysis        Life Vest Oxygen Special Bed Trach Size Wound Vac  Skin non pressure wound to back Bowel mgmt:incontinent Bladder mgmt:external catheter Diabetic mgmt Hgb A1c 6.0 Behavioral consideration  Chemo/radiation  43 inch 10 FR left nare cortrak placed 2/26. TO be discontinued today  Designated visitor is Audiological scientist to be scheduled with therapy    Previous Home Environment  Living Arrangements: (lives with spouse and goes every few months between [place] a)  Lives With: Spouse Available Help at Discharge: (spouse, son and his wife and their 4 children in [place]. [place]) Type of Home: Mobile home Home Layout: One level Home Access: Stairs to enter Entrance Stairs-Rails: Right, Left, Can reach both Entrance Stairs-Number of Steps: 7 Bathroom Shower/Tub:  Chiropodist: Standard Bathroom Accessibility: Yes How Accessible: Accessible via walker [place]: No   Discharge Living Setting Plans for Discharge Living Setting: Lives with (comment)(stays with son and his family every few months and they aslo) Type of Home at Discharge: Mobile home Discharge Home Layout: One level Discharge Home Access: Stairs to enter Entrance Stairs-Rails: Right, Left, Can reach both Entrance Stairs-Number of Steps: 7 Discharge Bathroom Shower/Tub: Tub/shower unit Discharge Bathroom Toilet: Standard Discharge Bathroom Accessibility: Yes How Accessible: Accessible via walker Does the patient have any problems obtaining your medications?: No   Social/Family/Support Systems Patient Roles: Spouse, Parent Contact Information: son, [place]. He speaks fluent ENglish Anticipated Caregiver: son, son's wife and pt's spouse Anticipated Caregiver's Contact Information: [phone] Ability/Limitations of Caregiver: son works, but his wife and pt's husband do not Caregiver Availability: 24/7 Discharge Plan Discussed with Primary Caregiver: Yes Is Caregiver In Agreement with Plan?: Yes Does Caregiver/Family have Issues with Lodging/Transportation while Pt is in Rehab?: No     Goals/Additional Needs Patient/Family Goal for Rehab: Mod I to supervision with PT, OT and SLP Expected length of stay: [facility]OS 16 to 20 days Cultural Considerations: [place] speaker; does not speak any Solicitor Service Needs: Catholic Additional Information: son plans to be here every day during visiting hours Pt/Family Agrees to Admission and willing to participate: Yes Program Orientation Provided & Reviewed with Pt/Caregiver Including Roles  & Responsibilities: Yes   Decrease burden of Care through IP rehab admission:    Possible need for SNF placement upon discharge:   Patient Condition: This patient's condition remains as documented in the consult dated  07/24/2019, in which the Rehabilitation Physician determined  and documented that the patient's condition is appropriate for intensive rehabilitative care in an inpatient rehabilitation facility. Will admit to inpatient rehab today.   Preadmission Screen Completed By:  Cleatrice Burke, RN, 07/25/2019 11:51 AM ______________________________________________________________________   Discussed status with Dr. Dagoberto Ligas on 07/25/2019 at  1200 and received approval for admission today.   Admission Coordinator:  Cleatrice Burke, time 1200 Date 07/25/2019         Cosigned by: Courtney Heys, MD at 07/25/2019 12:07 PM  Revision History

## 2019-07-25 NOTE — H&P (Signed)
Physical Medicine and Rehabilitation Admission H&P    No chief complaint on file. : HPI: Alishea Hesch is a 74 year old limited English speaking right-handed female with history of hypertension as well as hypothyroidism.  Per chart review patient lives alone in [place] was here visiting family in the area for the last 6 months.  Son in the area with excellent support.  Presented 07/19/2019 with right-sided weakness.  Cranial CT/MRI/MRA scan showed subtle loss of gray-white matter differentiation involving the left frontal operculum concerning for acute left MCA territory infarction.  Small chronic high left frontal infarction.  Patient did receive TPA.  CT angiogram of head and neck positive for emergent large vessel occlusion with occlusive thrombus within proximal left M2 branch superior division.  Patient underwent revascularization per interventional radiology Dr Katherina Right Rodrigues,[place] and remained intubated for short time through 07/23/2019.  Echocardiogram with ejection fraction of 65% without emboli.  Cardiology follow-up for bouts of atrial fibrillation and flutter maintained on amiodarone 200 mg twice daily x1 month and decreasing to daily.  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  Patient currently is maintained on low-dose aspirin 81 mg daily plan to start Eliquis in 7 to 10 days with resolution of SAH.  Dysphagia #1 thin liquid diet.  Nasogastric tube had initially been in place for nutritional support.  Prediabetes with hemoglobin A1c 6.1.  Presently maintained on sliding scale insulin.  Therapy evaluations completed and patient was admitted for a comprehensive rehab program.  Review of Systems  Constitutional: Negative for chills and fever.  HENT: Negative for hearing loss.   Eyes: Negative for blurred vision and double vision.  Respiratory: Negative for cough and shortness of  breath.   Cardiovascular: Positive for palpitations and leg swelling. Negative for chest pain.  Gastrointestinal: Positive for constipation. Negative for heartburn, nausea and vomiting.  Genitourinary: Negative for dysuria and flank pain.  Musculoskeletal: Positive for joint pain and myalgias.  Skin: Negative for rash.  Neurological: Positive for speech change and weakness.  All other systems reviewed and are negative.  Past Medical History:  Diagnosis Date  . Hypertension   . Thyroid disease    Past Surgical History:  Procedure Laterality Date  . IR CT HEAD LTD  07/20/2019  . IR PERCUTANEOUS ART THROMBECTOMY/INFUSION INTRACRANIAL INC DIAG ANGIO  07/20/2019  . RADIOLOGY WITH ANESTHESIA N/A 07/20/2019   Procedure: IR WITH ANESTHESIA;  Surgeon: Luanne Bras, MD;  Location: [facility];  Service: Radiology;  Laterality: N/A;   Family History  Family history unknown: Yes   Social History:  reports that she has never smoked. She has never used smokeless tobacco. She reports that she does not drink alcohol or use drugs. Allergies: No Known Allergies Medications Prior to Admission  Medication Sig Dispense Refill  . amiodarone (PACERONE) 200 MG tablet Take 1 tablet (200 mg total) by mouth 2 (two) times daily.    Derrill Memo ON 07/26/2019] aspirin EC 81 MG EC tablet Take 1 tablet (81 mg total) by mouth daily.    Derrill Memo ON 07/26/2019] atorvastatin (LIPITOR) 40 MG tablet Take 1 tablet (40 mg total) by mouth daily.    . chlorhexidine (PERIDEX) 0.12 % solution 15 mLs by Mouth Rinse route 2 (two) times daily. 120 mL 0  . Chlorhexidine Gluconate Cloth 2 % PADS Apply 6 each topically daily.    . feeding supplement, ENSURE ENLIVE, (ENSURE ENLIVE) LIQD Take  237 mLs by mouth 2 (two) times daily between meals. 237 mL 12  . insulin aspart (NOVOLOG) 100 UNIT/ML injection Inject 0-9 Units into the skin every 4 (four) hours. 10 mL 11  . levothyroxine (SYNTHROID) 100 MCG tablet Take 50-75 mcg by mouth See admin  instructions. 63mcg one day, 53mcg the next day, then repeat.    . mouth rinse LIQD solution 15 mLs by Mouth Rinse route 2 times daily at 12 noon and 4 pm.  0  . sodium chloride flush (NS) 0.9 % SOLN 10-40 mLs by Intracatheter route as needed (flush).      Drug Regimen Review Drug regimen was reviewed and remains appropriate with no significant issues identified  Home:     Functional History:    Functional Status:  Mobility:          ADL:    Cognition: Cognition Orientation Level: Oriented to person    Physical Exam: Blood pressure 100/73, pulse 65, temperature 98.5 F (36.9 C), temperature source Oral, resp. rate 16, height 5\' 2"  (1.575 m), weight 70.5 kg, SpO2 93 %. Physical Exam  Nursing note and vitals reviewed. Constitutional: She appears well-developed and well-nourished.  Walked to bed from bathroom with CNA CGA with no AD; son at bedside; appropriate, no words but vocalizes some. NAD wearing her bifocals  HENT:  Head: Normocephalic and atraumatic.  Nose: Nose normal.  Mouth/Throat: Oropharynx is clear and moist.  Unable/couldn't stick tongue out- either didn't understand or wouldn't- giggled when asked 3x; R facial droop noted Facial sensation decreased on R side  Eyes: Pupils are equal, round, and reactive to light. Conjunctivae and EOM are normal.  No nystagmus B/L  Neck: No tracheal deviation present.  Cardiovascular:  RRR, no M/R/G  Respiratory: No stridor.  CTA B/L- no W/R/R  GI:  Abdomen soft, NT, ND- (+) BS; LBM last night  Musculoskeletal:     Cervical back: Normal range of motion and neck supple.     Comments: Pt very apraxic- couldn't participate in MS exam well attempted with son as interpretor but kept going the other way from direction asked.   LUE ~5/5 in deltoid, biceps, triceps, grip and finger abd RUE- attempted all muscles, but only tested triceps 4/5, grip 4-/5 otherwise could not attempt with me LLE- 5/5 in HF, KE, KF, DF, and  PF RLE- ~ 4/5 in HF and DF/PF   Neurological:  Patient is alert with son at bedside to assist with translation.  She has a left gaze preference and facial droop.  Appears globally aphasic.  Oriented x1 to self only.  She does follow some simple demonstrated commands. 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   Skin: Skin is warm and dry.  Unable to check backside, but arms, legs and feet have no skin issues. Son reports no skin breakdown that's been reported  Psychiatric:  Smiling intermittently    Results for orders placed or performed during the hospital encounter of 07/25/19 (from the past 48 hour(s))  Glucose, capillary     Status: Abnormal   Collection Time: 07/25/19  4:16 PM  Result Value Ref Range   Glucose-Capillary 112 (H) 70 - 99 mg/dL    Comment: Glucose reference range applies only to samples taken  after fasting for at least 8 hours.   CT HEAD WO CONTRAST  Result Date: 07/24/2019 CLINICAL DATA:  Stroke, follow-up. Revascularization of left M2 division. EXAM: CT HEAD WITHOUT CONTRAST TECHNIQUE: Contiguous axial images were obtained from the base of the skull through the vertex without intravenous contrast. COMPARISON:  CT head without contrast 07/20/2019. MR head without contrast 07/22/2019 FINDINGS: Brain: Left frontal operculum nonhemorrhagic infarct is stable. Punctate infarcts of the left parietal lobe are not appreciated by CT. Trace blood in the left sylvian fissure is resolving. Mass effect with 3 mm midline shift is noted. Ventricles are otherwise normal. No new infarcts are present. The brainstem and cerebellum are normal. Scattered parenchymal calcifications are again seen. Vascular: No hyperdense vessel or  unexpected calcification. Skull: No significant extracranial soft tissue lesion is present. Calvarium is intact. No focal lytic or blastic lesions are present. Sinuses/Orbits: Chronic left middle ear and mastoid effusion is present. Paranasal sinuses and right mastoid air cells are clear. The globes and orbits are within normal limits. IMPRESSION: 1. Stable left frontal operculum nonhemorrhagic infarct. 2. Resolving subarachnoid blood in the left Sylvian fissure. 3. No new infarcts. 4. Chronic left middle ear and mastoid effusion. Electronically Signed   By: San Morelle M.D.   On: 07/24/2019 06:27   DG Swallowing Func-Speech Pathology  Result Date: 07/24/2019 Objective Swallowing Evaluation: Type of Study: MBS-Modified Barium Swallow Study  Patient Details Name: Symiah Socarras MRN: [id number] Date of Birth: [birth year] Today's Date: 07/24/2019 Time: SLP Start Time (ACUTE ONLY): 0810 -SLP Stop Time (ACUTE ONLY): 0830 SLP Time Calculation (min) (ACUTE ONLY): 20 min Past Medical History: Past Medical History: Diagnosis Date . Hypertension  . Thyroid disease  Past Surgical History: Past Surgical History: Procedure Laterality Date . IR CT HEAD LTD  07/20/2019 . IR PERCUTANEOUS ART THROMBECTOMY/INFUSION INTRACRANIAL INC DIAG ANGIO  07/20/2019 . RADIOLOGY WITH ANESTHESIA N/A 07/20/2019  Procedure: IR WITH ANESTHESIA;  Surgeon: Luanne Bras, MD;  Location: [facility];  Service: Radiology;  Laterality: N/A; HPI: 74 yo F who presented with right sided weakness found to have acute left M2/ MCA occlusion s/p tPA and emergent thrombectomy which was complicated by moderate left sylvian SAH.  On 2/25 patient with Afib/ Aflutter with RVR and post conversion pauses. VDRF and was extubated am of 2/27.   No data recorded Assessment / Plan / Recommendation CHL IP CLINICAL IMPRESSIONS 07/24/2019 Clinical Impression Pt demonstrates a primary oral dysphagia with slow volitional initaition of swallow activities.  Paticular  difficulty initiating and coordinating mastication, which was slow, but complete with mild right buccal residue.  Pt has mild right labial weakness with anterior spillage with large quantitiy rapid self feeding. She is able to sip from a straw, and ultimately this was most effective, with assist, to control bolus size and rate. When self feeding pt will often take multiple sips with complete oral cohesion of the bolus before triggered posterior transit and swallow response. There is therefore typically piecemeal transit of a very large bolus. Pharyngeal phase generally is WNL except for some mild oral/base of tongue transit that accumulates in the valleculae. Pt only experienced one instance of sensed aspiration when allowed to very impulsively take independent cup sips, throwing head back and resulting in an instance of premature spillage. Cough response immediate, hard and effective. Will initiate a dys 1 (puree) diet with thin liquids with good potential for upgraded solids with therapy. Suspect automaticity will improve with intake.  SLP Visit Diagnosis Dysphagia, oropharyngeal phase (R13.12)  Attention and concentration deficit following -- Frontal lobe and executive function deficit following -- Impact on safety and function --   CHL IP TREATMENT RECOMMENDATION 07/24/2019 Treatment Recommendations Therapy as outlined in treatment plan below   Prognosis 07/24/2019 Prognosis for Safe Diet Advancement Good Barriers to Reach Goals Language deficits Barriers/Prognosis Comment -- CHL IP DIET RECOMMENDATION 07/24/2019 SLP Diet Recommendations Dysphagia 1 (Puree) solids;Thin liquid Liquid Administration via Straw Medication Administration Crushed with puree Compensations Slow rate;Small sips/bites Postural Changes Seated upright at 90 degrees   CHL IP OTHER RECOMMENDATIONS 07/24/2019 Recommended Consults -- Oral Care Recommendations Oral care BID Other Recommendations --   CHL IP FOLLOW UP RECOMMENDATIONS 07/24/2019 Follow up  Recommendations Inpatient Rehab   CHL IP FREQUENCY AND DURATION 07/24/2019 Speech Therapy Frequency (ACUTE ONLY) min 2x/week Treatment Duration 2 weeks      CHL IP ORAL PHASE 07/24/2019 Oral Phase Impaired Oral - Pudding Teaspoon -- Oral - Pudding Cup -- Oral - Honey Teaspoon -- Oral - Honey Cup -- Oral - Nectar Teaspoon Delayed oral transit;Piecemeal swallowing;Lingual/palatal residue Oral - Nectar Cup Delayed oral transit;Piecemeal swallowing;Lingual/palatal residue;Right anterior bolus loss Oral - Nectar Straw -- Oral - Thin Teaspoon -- Oral - Thin Cup Delayed oral transit;Piecemeal swallowing;Lingual/palatal residue Oral - Thin Straw Delayed oral transit;Piecemeal swallowing;Lingual/palatal residue Oral - Puree Delayed oral transit;Piecemeal swallowing;Lingual/palatal residue Oral - Mech Soft Delayed oral transit;Piecemeal swallowing;Lingual/palatal residue;Right pocketing in lateral sulci;Impaired mastication Oral - Regular -- Oral - Multi-Consistency -- Oral - Pill -- Oral Phase - Comment --  CHL IP PHARYNGEAL PHASE 07/24/2019 Pharyngeal Phase Impaired Pharyngeal- Pudding Teaspoon -- Pharyngeal -- Pharyngeal- Pudding Cup -- Pharyngeal -- Pharyngeal- Honey Teaspoon -- Pharyngeal -- Pharyngeal- Honey Cup -- Pharyngeal -- Pharyngeal- Nectar Teaspoon WFL Pharyngeal -- Pharyngeal- Nectar Cup WFL Pharyngeal -- Pharyngeal- Nectar Straw -- Pharyngeal -- Pharyngeal- Thin Teaspoon -- Pharyngeal -- Pharyngeal- Thin Cup Penetration/Aspiration before swallow;Pharyngeal residue - valleculae Pharyngeal Material enters airway, passes BELOW cords then ejected out;Material does not enter airway Pharyngeal- Thin Straw WFL Pharyngeal -- Pharyngeal- Puree Pharyngeal residue - valleculae;Reduced tongue base retraction Pharyngeal -- Pharyngeal- Mechanical Soft -- Pharyngeal -- Pharyngeal- Regular Pharyngeal residue - valleculae;Reduced tongue base retraction Pharyngeal -- Pharyngeal- Multi-consistency -- Pharyngeal -- Pharyngeal- Pill --  Pharyngeal -- Pharyngeal Comment --  No flowsheet data found. Herbie Baltimore, MA CCC-SLP Acute Rehabilitation Services Pager [phone] Office [phone] Lynann Beaver 07/24/2019, 9:03 AM                  Medical Problem List and Plan: 1.  Right-sided weakness with aphasia/dysphagia secondary to left MCA infarct due to left M2 occlusion status post TPA and IR revascularization with focal SAH, infarct embolic secondary newly diagnosed atrial fibrillation  -suggest Amantadine for aphasia. Vs Bromocriptine for apraxia.   -patient may  shower  -ELOS/Goals: ~ 2weeks; mod I except for speech 2.  Antithrombotics: -DVT/anticoagulation: SCDs- until OK'd by Neurology.   -antiplatelet therapy: Aspirin 81 mg daily 3. Pain Management: Tylenol as needed 4. Mood: Advised emotional support  -antipsychotic agents: N/A 5. Neuropsych: This patient is not capable of making decisions on her own behalf due to aphasia. 6. Skin/Wound Care: Routine skin checks 7. Fluids/Electrolytes/Nutrition: Routine in and outs with follow-up chemistries 8.  Atrial fibrillation and flutter.  Amiodarone 200 mg twice daily x1 month then decrease to daily.PLAN TO START ELIQUIS 7-10 DAYS WITH RESOLUTION OF SAH. 9.  Permissive hypertension.  Monitor with increased mobility.  Patient on HCTZ 25 mg daily prior to admission 10.  Dysphagia.  Dysphagia #1 thin liquid diet.  Nasogastric tube currently in place for nutritional support -removed Cortrak this afternoon  11.  Hypothyroidism.  TSH level 6.781- 7.052.  Free T4 normal 0.80.  Synthroid 37.5 mg daily -suggest increase in dose since this was home dose.  12.  Hyperlipidemia.  Lipitor 13.  Prediabetes.  Hemoglobin A1c 6.1.  SSI   Courtney Heys, MD 07/25/2019

## 2019-07-25 NOTE — Progress Notes (Addendum)
Cortrak removed. Patient tolerated well. Patient sitting up in bed comfortably. Son is at bedside. Call bell is within reach. Will continue to monitor.  Janett Billow, RN

## 2019-07-26 ENCOUNTER — Inpatient Hospital Stay (HOSPITAL_COMMUNITY): Payer: Medicare Other | Admitting: Occupational Therapy

## 2019-07-26 ENCOUNTER — Inpatient Hospital Stay (HOSPITAL_COMMUNITY): Payer: Medicare Other | Admitting: Speech Pathology

## 2019-07-26 ENCOUNTER — Inpatient Hospital Stay (HOSPITAL_COMMUNITY): Payer: Medicare Other | Admitting: Physical Therapy

## 2019-07-26 LAB — CBC WITH DIFFERENTIAL/PLATELET
Abs Immature Granulocytes: 0.13 10*3/uL — ABNORMAL HIGH (ref 0.00–0.07)
Basophils Absolute: 0.1 10*3/uL (ref 0.0–0.1)
Basophils Relative: 1 %
Eosinophils Absolute: 0.3 10*3/uL (ref 0.0–0.5)
Eosinophils Relative: 4 %
HCT: 44.6 % (ref 36.0–46.0)
Hemoglobin: 14.8 g/dL (ref 12.0–15.0)
Immature Granulocytes: 2 %
Lymphocytes Relative: 32 %
Lymphs Abs: 2.3 10*3/uL (ref 0.7–4.0)
MCH: 29.5 pg (ref 26.0–34.0)
MCHC: 33.2 g/dL (ref 30.0–36.0)
MCV: 88.8 fL (ref 80.0–100.0)
Monocytes Absolute: 0.5 10*3/uL (ref 0.1–1.0)
Monocytes Relative: 6 %
Neutro Abs: 4 10*3/uL (ref 1.7–7.7)
Neutrophils Relative %: 55 %
Platelets: 226 10*3/uL (ref 150–400)
RBC: 5.02 MIL/uL (ref 3.87–5.11)
RDW: 13.4 % (ref 11.5–15.5)
WBC: 7.3 10*3/uL (ref 4.0–10.5)
nRBC: 0 % (ref 0.0–0.2)

## 2019-07-26 LAB — GLUCOSE, CAPILLARY
Glucose-Capillary: 105 mg/dL — ABNORMAL HIGH (ref 70–99)
Glucose-Capillary: 81 mg/dL (ref 70–99)
Glucose-Capillary: 136 mg/dL — ABNORMAL HIGH (ref 70–99)
Glucose-Capillary: 134 mg/dL — ABNORMAL HIGH (ref 70–99)

## 2019-07-26 LAB — COMPREHENSIVE METABOLIC PANEL
ALT: 38 U/L (ref 0–44)
AST: 35 U/L (ref 15–41)
Albumin: 3.3 g/dL — ABNORMAL LOW (ref 3.5–5.0)
Alkaline Phosphatase: 85 U/L (ref 38–126)
Anion gap: 14 (ref 5–15)
BUN: 22 mg/dL (ref 8–23)
CO2: 21 mmol/L — ABNORMAL LOW (ref 22–32)
Calcium: 8.7 mg/dL — ABNORMAL LOW (ref 8.9–10.3)
Chloride: 103 mmol/L (ref 98–111)
Creatinine, Ser: 0.9 mg/dL (ref 0.44–1.00)
GFR calc Af Amer: >60 mL/min (ref >60)
GFR calc non Af Amer: >60 mL/min (ref >60)
Glucose, Bld: 163 mg/dL — ABNORMAL HIGH (ref 70–99)
Potassium: 4 mmol/L (ref 3.5–5.1)
Sodium: 138 mmol/L (ref 135–145)
Total Bilirubin: 0.8 mg/dL (ref 0.3–1.2)
Total Protein: 7 g/dL (ref 6.5–8.1)

## 2019-07-26 MED ORDER — ATORVASTATIN CALCIUM 40 MG PO TABS
40.0000 mg | ORAL_TABLET | Freq: Every day | ORAL | Status: DC
  Administered 2019-07-26 – 2019-08-03 (×9): 40 mg via ORAL
  Filled 2019-07-26 (×9): qty 1

## 2019-07-26 MED ORDER — ENSURE ENLIVE PO LIQD
237.0000 mL | Freq: Three times a day (TID) | ORAL | Status: DC
  Administered 2019-07-26 – 2019-08-02 (×18): 237 mL via ORAL

## 2019-07-26 MED ORDER — AMIODARONE HCL 200 MG PO TABS
200.0000 mg | ORAL_TABLET | Freq: Two times a day (BID) | ORAL | Status: DC
  Administered 2019-07-26 – 2019-07-28 (×5): 200 mg via ORAL
  Filled 2019-07-26 (×6): qty 1

## 2019-07-26 MED ORDER — LIVING WELL WITH DIABETES BOOK - IN SPANISH
Freq: Once | Status: DC
  Filled 2019-07-26: qty 1

## 2019-07-26 MED ORDER — LIDOCAINE 5 % EX PTCH
1.0000 | MEDICATED_PATCH | CUTANEOUS | Status: DC
  Administered 2019-07-26 – 2019-08-02 (×8): 1 via TRANSDERMAL
  Filled 2019-07-26 (×7): qty 1

## 2019-07-26 MED ORDER — LEVOTHYROXINE SODIUM 75 MCG PO TABS
37.5000 ug | ORAL_TABLET | Freq: Every day | ORAL | Status: DC
  Administered 2019-07-26 – 2019-08-03 (×9): 37.5 ug via ORAL
  Filled 2019-07-26 (×9): qty 1

## 2019-07-26 NOTE — Evaluation (Signed)
Occupational Therapy Assessment and Plan  Patient Details  Name: Jamie Mitchell MRN: [id number] Date of Birth: 01-01-1946  OT Diagnosis: cognitive deficits, hemiplegia affecting dominant side, muscle weakness (generalized) and coordination disorder Rehab Potential: Rehab Potential (ACUTE ONLY): Good ELOS: 5-7 days   Today's Date: 07/26/2019 OT Individual Time: 1335-1445 OT Individual Time Calculation (min): 70 min     Problem List:  Patient Active Problem List   Diagnosis Date Noted  . Renal failure 07/25/2019  . Prediabetes 07/25/2019  . 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 Dysphagia 07/25/2019  . 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 Acute respiratory failure ([place])   . Tachycardia-bradycardia syndrome ([place])   . Atrial fibrillation with RVR ([place])   . Hypertension 07/23/2015  . Hyperlipidemia 07/23/2015  . 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 Hypothyroidism 01/25/2014    Past Medical History:  Past Medical History:  Diagnosis Date  . Hypertension   . Thyroid disease    Past Surgical History:  Past Surgical History:  Procedure Laterality Date  . IR CT HEAD LTD  07/20/2019  . IR PERCUTANEOUS ART THROMBECTOMY/INFUSION INTRACRANIAL INC DIAG ANGIO  07/20/2019  . RADIOLOGY WITH ANESTHESIA N/A 07/20/2019   Procedure: IR WITH ANESTHESIA;  Surgeon: Luanne Bras, MD;  Location: [facility];  Service: Radiology;  Laterality: N/A;    Assessment & Plan Clinical Impression: Patient is a 74 y.o. year old female with history of hypertension as well as hypothyroidism.  Per chart review patient lives alone in [place] was here visiting family in the area for the last 6 months.  Son in the area with excellent support.  Presented 07/19/2019 with right-sided weakness.  Cranial CT/MRI/MRA scan showed subtle  loss of gray-white matter differentiation involving the left frontal operculum concerning for acute left MCA territory infarction.  Small chronic high left frontal infarction.  Patient did receive TPA.  CT angiogram of head and neck positive for emergent large vessel occlusion with occlusive thrombus within proximal left M2 branch superior division.  Patient underwent revascularization per interventional radiology Dr Jamie Mitchell,[place] and remained intubated for short time through 07/23/2019.  Echocardiogram with ejection fraction of 65% without emboli.  Cardiology follow-up for bouts of atrial fibrillation and flutter maintained on amiodarone 200 mg twice daily x1 month and decreasing to daily.  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  Patient currently is maintained on low-dose aspirin 81 mg daily plan to start Eliquis in 7 to 10 days with resolution of SAH.  Dysphagia #1 thin liquid diet.  Nasogastric tube had initially been in place for nutritional support.  Prediabetes with hemoglobin A1c 6.1.  Presently maintained on sliding scale insulin.  Therapy evaluations completed and patient was admitted for a comprehensive rehab program. .  Patient transferred to CIR on 07/25/2019 .    Patient currently requires min with basic self-care skills and IADL secondary to muscle weakness, decreased cardiorespiratoy endurance, decreased coordination and decreased motor planning, inattention, decreased awareness, decreased problem solving and decreased safety awareness and decreased standing balance, decreased postural control, hemiplegia and decreased balance strategies.  Prior to hospitalization, patient could complete ADLs and IADLs with independent .  Patient will benefit from skilled intervention to decrease level of assist with basic self-care skills prior to discharge home  with care partner.  Anticipate patient will  require 24 hour supervision and follow up outpatient.  OT - End of Session Activity Tolerance: Decreased this session Endurance Deficit: Yes Endurance Deficit Description: multiple rest breaks with self care secondary to pt moving very quickly OT Assessment Rehab Potential (ACUTE ONLY): Good OT Barriers to Discharge: Other (comments) OT Barriers to Discharge Comments: none known at this time OT Patient demonstrates impairments in the following area(s): Cognition;Balance;Endurance;Pain;Safety OT Basic ADL's Functional Problem(s): Grooming;Bathing;Dressing;Toileting OT Advanced ADL's Functional Problem(s): Simple Meal Preparation;Laundry OT Transfers Functional Problem(s): Toilet;Tub/Shower OT Additional Impairment(s): None OT Plan OT Intensity: Minimum of 1-2 x/day, 45 to 90 minutes OT Frequency: 5 out of 7 days OT Duration/Estimated Length of Stay: 5-7 days OT Treatment/Interventions: Balance/vestibular training;Self Care/advanced ADL retraining;UE/LE Coordination activities;Functional mobility training;Visual/perceptual remediation/compensation;Cognitive remediation/compensation;Community reintegration;Neuromuscular re-education;Wheelchair propulsion/positioning;Discharge planning;Pain management;Therapeutic Activities;Disease mangement/prevention;Patient/family education;Therapeutic Exercise;Psychosocial support;UE/LE Strength taining/ROM;DME/adaptive equipment instruction OT Self Feeding Anticipated Outcome(s): supervision OT Basic Self-Care Anticipated Outcome(s): supervision OT Toileting Anticipated Outcome(s): supervision OT Bathroom Transfers Anticipated Outcome(s): supervision OT Recommendation Recommendations for Other Services: (none at this time) Follow Up Recommendations: 24 hour supervision/assistance;Outpatient OT Equipment Recommended: To be determined   Skilled Therapeutic Intervention Upon entering the room, pt supine in bed with interpreter and son , [name], present. Pt  agreeable to OT intervention with c/o pain in L shoulder and RN notified. OT educated pt and caregiver on OT purpose, POC, and goals with them verbalizing understanding. Pt ambulating without use of AD and min guard for balance. Pt bathing self with increased time and min multimodal cuing/gestures. Pt returning to sit on EOB to don clothing tasks with min guard for balance with LB clothing management. Pt standing at sink for grooming tasks with min guard for balance but able to open all containers without assistance. Pt needing to be cued to answer "yes" or "no" to questions with interpreter and pt quickly becoming frustrated when she was unable to answer how she wanted. Pt fatigues after shower and requesting to return to bed at end of session. Call bell and all needed items within reach upon exiting the room.   OT Evaluation Precautions/Restrictions  Precautions Precautions: Fall Pain Pain Assessment Pain Scale: PAINAD Pain Score: 2  Pain Type: Acute pain Pain Location: Shoulder Pain Orientation: Left Pain Descriptors / Indicators: Discomfort Pain Onset: On-going Patients Stated Pain Goal: 2 Pain Intervention(s): Repositioned Home Living/Prior Functioning Home Living Available Help at Discharge: Family, Available 24 hours/day Type of Home: Mobile home Home Access: Stairs to enter Technical brewer of Steps: 7 Entrance Stairs-Rails: Right, Left, Can reach both Home Layout: One level Bathroom Shower/Tub: Optometrist: Yes  Lives With: Spouse Prior Function Level of Independence: Independent with basic ADLs  Able to Take Stairs?: Yes Comments: 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, 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;  just came from [place] 1 week prior to CVA Vision Baseline Vision/History: Wears glasses Wears Glasses: At all times Patient Visual Report: No change from baseline Cognition Overall Cognitive Status: Impaired/Different from  baseline Arousal/Alertness: Awake/alert Orientation Level: Person Year: (no answer) Month: (no answer) Day of Week: Incorrect Memory: (language difficulty) Immediate Memory Recall: (unable to answer secondary to global aphasia) Memory Recall Sock: (unable to answer secondary to global aphasia) Attention: Sustained Sustained Attention: Appears intact Awareness: Impaired Awareness Impairment: Emergent impairment Problem Solving: Impaired Problem Solving Impairment: Functional basic Executive Function: Reasoning;Decision Making;Initiating Reasoning: Impaired Reasoning Impairment: Functional basic Decision Making: Impaired Decision Making Impairment: Functional basic Initiating: Impaired Initiating Impairment: Functional basic  Behaviors: Impulsive Safety/Judgment: Impaired Sensation Sensation Light Touch: Appears Intact Hot/Cold: Appears Intact Proprioception: Appears Intact Stereognosis: Not tested Coordination Gross Motor Movements are Fluid and Coordinated: No Fine Motor Movements are Fluid and Coordinated: No Coordination and Movement Description: mild dysmetria in the RUE, but able ot complete self care tasks with increased time. Motor  Motor Motor: Hemiplegia Motor - Skilled Clinical Observations: mild R sided hemiplegia, UE>LE Mobility  Bed Mobility Bed Mobility: Rolling Right;Rolling Left;Supine to Sit;Sit to Sidelying Right Rolling Right: Supervision/verbal cueing Rolling Left: Supervision/Verbal cueing Supine to Sit: Supervision/Verbal cueing Sit to Sidelying Right: Supervision/Verbal cueing Transfers Sit to Stand: Minimal Assistance - Patient > 75%  Trunk/Postural Assessment  Cervical Assessment Cervical Assessment: Within Functional Limits Thoracic Assessment Thoracic Assessment: Within Functional Limits Lumbar Assessment Lumbar Assessment: Within Functional Limits Postural Control Postural Control: Within Functional Limits  Balance Balance Balance  Assessed: Yes Standardized Balance Assessment Standardized Balance Assessment: Timed Up and Go Test Timed Up and Go Test TUG: Normal TUG Normal TUG (seconds): 18.6(20, 16, 20) Static Sitting Balance Static Sitting - Balance Support: No upper extremity supported Static Sitting - Level of Assistance: 6: Modified independent (Device/Increase time) Dynamic Sitting Balance Dynamic Sitting - Balance Support: No upper extremity supported Dynamic Sitting - Level of Assistance: 5: Stand by assistance Sitting balance - Comments: close supervision EOB Static Standing Balance Static Standing - Balance Support: No upper extremity supported Static Standing - Level of Assistance: 5: Stand by assistance Dynamic Standing Balance Dynamic Standing - Balance Support: No upper extremity supported;During functional activity Dynamic Standing - Level of Assistance: 4: Min assist Extremity/Trunk Assessment RUE Assessment RUE Assessment: Within Functional Limits General Strength Comments: functional but uncoordinated LUE Assessment LUE Assessment: Within Functional Limits     Refer to Care Plan for Long Term Goals  Recommendations for other services: None    Discharge Criteria: Patient will be discharged from OT if patient refuses treatment 3 consecutive times without medical reason, if treatment goals not met, if there is a change in medical status, if patient makes no progress towards goals or if patient is discharged from hospital.  The above assessment, treatment plan, treatment alternatives and goals were discussed and mutually agreed upon: by patient and by family  Gypsy Decant 07/26/2019, 2:44 PM

## 2019-07-26 NOTE — Evaluation (Signed)
Physical Therapy Assessment and Plan  Patient Details  Name: Jamie Mitchell MRN: [id number] Date of Birth: 1945/06/05  PT Diagnosis: Coordination disorder, Difficulty walking, Hemiplegia dominant and Muscle weakness Rehab Potential: Excellent ELOS: 5-7 days   Today's Date: 07/26/2019 PT Individual Time: 1035-1130 PT Individual Time Calculation (min): 55 min    Problem List:  Patient Active Problem List   Diagnosis Date Noted  . Renal failure 07/25/2019  . Prediabetes 07/25/2019  . 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 Dysphagia 07/25/2019  . 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 Acute respiratory failure ([place])   . Tachycardia-bradycardia syndrome ([place])   . Atrial fibrillation with RVR ([place])   . Hypertension 07/23/2015  . Hyperlipidemia 07/23/2015  . 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 Hypothyroidism 01/25/2014    Past Medical History:  Past Medical History:  Diagnosis Date  . Hypertension   . Thyroid disease    Past Surgical History:  Past Surgical History:  Procedure Laterality Date  . IR CT HEAD LTD  07/20/2019  . IR PERCUTANEOUS ART THROMBECTOMY/INFUSION INTRACRANIAL INC DIAG ANGIO  07/20/2019  . RADIOLOGY WITH ANESTHESIA N/A 07/20/2019   Procedure: IR WITH ANESTHESIA;  Surgeon: Luanne Bras, MD;  Location: [facility];  Service: Radiology;  Laterality: N/A;    Assessment & Plan Clinical Impression: Patient is a 74 y.o.limited English speaking right-handed femalewith history of hypertension as well as hypothyroidism. Per chart review patient lives alone in [place] was here visiting family in the area for the last 6 months with her son. Mobile home 7 steps to entry. Presented 07/19/2019 with right-sided weakness. Cranial CT scan showed subtle loss of gray-white matter  differentiation involving the left frontal operculum concerning for acute left MCA territory infarction. Small chronic high left frontal infarction. Patient did receive TPA. CT angiogram of head and neck positive for emergent large vessel occlusion with occlusive thrombus within proximal left M2 branch superior division. Patient underwent revascularization per interventional radiology remain intubated for a short time extubated 07/23/2019. Echocardiogram with ejection fraction 65% without emboli. Cardiology follow-up for bouts of atrial fibrillation and flutter and currently maintained on amiodarone.   Patient transferred to CIR on 07/25/2019 .   Patient currently requires min with mobility secondary to muscle weakness, decreased cardiorespiratoy endurance, decreased coordination and decreased standing balance, hemiplegia and decreased balance strategies.  Prior to hospitalization, patient was independent  with mobility and lived with family   in a Mobile home home.  Home access is  Stairs to enter.  Patient will benefit from skilled PT intervention to maximize safe functional mobility, minimize fall risk and decrease caregiver burden for planned discharge home with intermittent assist.  Anticipate patient will benefit from follow up [facility] at discharge.  PT - End of Session Activity Tolerance: Tolerates 30+ min activity with multiple rests Endurance Deficit: Yes PT Assessment Rehab Potential (ACUTE/IP ONLY): Excellent PT Barriers to Discharge: Medical stability;Home environment access/layout PT Barriers to Discharge Comments: expressive deficits PT Patient demonstrates impairments in the following area(s): Balance;Behavior;Endurance;Motor;Safety PT Transfers Functional Problem(s): Bed Mobility;Bed to Chair;Car;Furniture;Floor PT Locomotion Functional Problem(s): Ambulation;Wheelchair Mobility;Stairs PT Plan PT Intensity: Minimum of 1-2 x/day ,45 to 90 minutes PT Frequency: 5 out of 7 days PT  Duration Estimated Length of Stay: 5-7 days PT Treatment/Interventions: Ambulation/gait training;Discharge planning;Cognitive remediation/compensation;DME/adaptive equipment instruction;Functional mobility training;Psychosocial support;Splinting/orthotics;Therapeutic Activities;UE/LE Strength taining/ROM;Visual/perceptual remediation/compensation;Balance/vestibular  training;Community reintegration;Disease management/prevention;Neuromuscular re-education;Patient/family education;Skin care/wound management;Stair training;Therapeutic Exercise;UE/LE Coordination activities;Wheelchair propulsion/positioning;Functional electrical stimulation;Pain management PT Transfers Anticipated Outcome(s): Mod i without AD PT Locomotion Anticipated Outcome(s): Ambulatory at mod I with LRAD PT Recommendation Recommendations for Other Services: Therapeutic Recreation consult Therapeutic Recreation Interventions: Stress management;Outing/community reintergration Follow Up Recommendations: Home health PT Patient destination: Home Equipment Recommended: To be determined  Skilled Therapeutic Intervention Pt received supine in bed and agreeable to PT. Supine>sit transfer with supervision assist and min cues for decreased speed of movement. PT instructed patient in PT Evaluation and initiated treatment intervention; see below for results. PT educated patient in [place], rehab potential, rehab goals, and discharge recommendations.   PT instructed pt in TUG 18.6 sec avgerate of 3 trial (>15 sec indicates fall rist.  5xSTS without UE support: 20 sec. (>15 indicates increased fall risk. )  Car transfer completed with min assist and min cues for safety UE placement to reduce fall risk. Pt performed gait training over unlevel surface of ramp/mulch x 65f with min assist to prevent lateral LOB.  WC mobility x 1510fwith min assist to maintain straight path and cues for symmetry of UE use R and L.   Pt returned to room and performed  stand transfer to bed with Min assist and no AD. Sit>supine completed with supervision assist and left supine in bed with call bell in reach and all needs met.     PT Evaluation Precautions/Restrictions   fall General   Vital Signs  Pain Pain Assessment Pain Scale: Faces Pain Score: (premedicated therapy) Faces Pain Scale: Hurts a little bit Pain Type: Acute pain Pain Location: Shoulder Pain Orientation: Left Pain Descriptors / Indicators: Discomfort Pain Intervention(s): MD notified (Comment);Other (Comment)(MD in room and aware of pain) Home Living/Prior Functioning Home Living Available Help at Discharge: Family;Available 24 hours/day Type of Home: Mobile home Home Access: Stairs to enter Entrance Stairs-Number of Steps: 7 Entrance Stairs-Rails: Right;Left;Can reach both Home Layout: One level Bathroom Shower/Tub: TuOptometristYes Prior Function Comments: 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, 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;  [place] to CV[place]  Vision/Perception     WFL with all tasks performed  Cognition Overall Cognitive Status: Impaired/Different from baseline Arousal/Alertness: Awake/alert Orientation Level: Oriented to person(difficult to fully assess due to global aphasia) Attention: Sustained Sustained Attention: Appears intact Memory: (UTA due to language) Awareness: Impaired Awareness Impairment: Emergent impairment Problem Solving: Impaired Problem Solving Impairment: Functional basic Executive Function: Reasoning;Decision Making;Initiating Reasoning: Impaired Reasoning Impairment: Functional basic Decision Making: Impaired Decision Making Impairment: Functional basic Initiating: Impaired Initiating Impairment: Functional basic Behaviors: Impulsive Safety/Judgment: Impaired  Sensation Sensation Light Touch: Appears Intact Proprioception: Appears Intact Coordination Gross Motor Movements are Fluid and  Coordinated: No Fine Motor Movements are Fluid and Coordinated: No Coordination and Movement Description: mild dysmetria in the RUE, but able ot complete self care tasks with increased time. Motor  Motor Motor: Hemiplegia Motor - Skilled Clinical Observations: mild R sided hemiplegia, UE>LE  Mobility Bed Mobility Bed Mobility: Rolling Right;Rolling Left;Supine to Sit;Sit to Sidelying Right Rolling Right: Supervision/verbal cueing Rolling Left: Supervision/Verbal cueing Supine to Sit: Supervision/Verbal cueing Sit to Sidelying Right: Supervision/Verbal cueing Transfers Transfers: Sit to Stand;Stand Pivot Transfers Sit to Stand: Minimal Assistance - Patient > 75% Stand Pivot Transfers: Minimal Assistance - Patient > 75% Transfer (Assistive device): None Locomotion  Gait Ambulation: Yes Gait Assistance: Minimal Assistance - Patient > 75% Gait Distance (Feet): 130 Feet Assistive device: None Gait Gait:  Yes Gait Pattern: Narrow base of support;Poor foot clearance - right Stairs / Additional Locomotion Stairs: Yes Stairs Assistance: Minimal Assistance - Patient > 75% Stair Management Technique: One rail Right Number of Stairs: 12 Height of Stairs: 6 Wheelchair Mobility Wheelchair Mobility: Yes Wheelchair Assistance: Minimal assistance - Patient >75% Wheelchair Propulsion: Both upper extremities Wheelchair Parts Management: Needs assistance Distance: 150  Trunk/Postural Assessment  Cervical Assessment Cervical Assessment: Within Functional Limits Thoracic Assessment Thoracic Assessment: Within Functional Limits Lumbar Assessment Lumbar Assessment: Within Functional Limits Postural Control Postural Control: Within Functional Limits  Balance Balance Balance Assessed: Yes Standardized Balance Assessment Standardized Balance Assessment: Timed Up and Go Test Timed Up and Go Test TUG: Normal TUG Normal TUG (seconds): 18.6(20, 16, 20) Static Sitting Balance Static Sitting  - Balance Support: No upper extremity supported Static Sitting - Level of Assistance: 6: Modified independent (Device/Increase time) Dynamic Sitting Balance Dynamic Sitting - Balance Support: No upper extremity supported Dynamic Sitting - Level of Assistance: 5: Stand by assistance Static Standing Balance Static Standing - Balance Support: No upper extremity supported Static Standing - Level of Assistance: 5: Stand by assistance Dynamic Standing Balance Dynamic Standing - Balance Support: No upper extremity supported;During functional activity Dynamic Standing - Level of Assistance: 4: Min assist Extremity Assessment      RLE Assessment RLE Assessment: Exceptions to [facility] General Strength Comments: grossly 4+/5 proximal to distal. difficult to formally assessed from receptive aphasia. noted foot drag intermittently LLE Assessment LLE Assessment: Within Functional Limits    Refer to Care Plan for Long Term Goals  Recommendations for other services: Therapeutic Recreation  Stress management and Outing/community reintegration  Discharge Criteria: Patient will be discharged from PT if patient refuses treatment 3 consecutive times without medical reason, if treatment goals not met, if there is a change in medical status, if patient makes no progress towards goals or if patient is discharged from hospital.  The above assessment, treatment plan, treatment alternatives and goals were discussed and mutually agreed upon: by patient  Lorie Phenix 07/26/2019, 11:58 AM

## 2019-07-26 NOTE — Evaluation (Signed)
Speech Language Pathology Assessment and Plan  Patient Details  Name: Bryony Kaman MRN: [id number] Date of Birth: 1946-05-24  SLP Diagnosis: Aphasia;Apraxia;Cognitive Impairments;Speech and Language deficits;Dysphagia  Rehab Potential: Good ELOS: 7 days    Today's Date: 07/26/2019 SLP Individual Time: 0801-0900 SLP Individual Time Calculation (min): 59 min   Problem List:  Patient Active Problem List   Diagnosis Date Noted  . Renal failure 07/25/2019  . Prediabetes 07/25/2019  . 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 Dysphagia 07/25/2019  . 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 Acute respiratory failure ([place])   . Tachycardia-bradycardia syndrome ([place])   . Atrial fibrillation with RVR ([place])   . Hypertension 07/23/2015  . Hyperlipidemia 07/23/2015  . 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 Hypothyroidism 01/25/2014   Past Medical History:  Past Medical History:  Diagnosis Date  . Hypertension   . Thyroid disease    Past Surgical History:  Past Surgical History:  Procedure Laterality Date  . IR CT HEAD LTD  07/20/2019  . IR PERCUTANEOUS ART THROMBECTOMY/INFUSION INTRACRANIAL INC DIAG ANGIO  07/20/2019  . RADIOLOGY WITH ANESTHESIA N/A 07/20/2019   Procedure: IR WITH ANESTHESIA;  Surgeon: Luanne Bras, MD;  Location: [facility];  Service: Radiology;  Laterality: N/A;    Assessment / Plan / Recommendation Clinical Impression   HPI: Concepcion Gillott is a 74 year old limited English speaking right-handed female with history of hypertension as well as hypothyroidism.  Per chart review patient lives alone in [place] was here visiting family in the area for the last 6 months.  Son in the area with excellent support.  Presented 07/19/2019 with right-sided weakness.  Cranial CT/MRI/MRA scan  showed subtle loss of gray-white matter differentiation involving the left frontal operculum concerning for acute left MCA territory infarction.  Small chronic high left frontal infarction.  Patient did receive TPA.  CT angiogram of head and neck positive for emergent large vessel occlusion with occlusive thrombus within proximal left M2 branch superior division.  Patient underwent revascularization per interventional radiology Dr Katherina Right Rodrigues,[place] and remained intubated for short time through 07/23/2019.  Echocardiogram with ejection fraction of 65% without emboli.  Cardiology follow-up for bouts of atrial fibrillation and flutter maintained on amiodarone 200 mg twice daily x1 month and decreasing to daily.  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  Patient currently is maintained on low-dose aspirin 81 mg daily plan to start Eliquis in 7 to 10 days with resolution of SAH.  Dysphagia #1 thin liquid diet.  Nasogastric tube had initially been in place for nutritional support.  Prediabetes with hemoglobin A1c 6.1.  Presently maintained on sliding scale insulin.  Therapy evaluations completed and patient was admitted for a comprehensive rehab program 07/25/19. CIR SLP evaluations were completed 07/26/19 with results as follows:  Bedside swallow evaluation: Pt presents with moderate oral dysphagia characterized by impaired mastication, prolonged oral transit, and mild lingual and buccal residue of solid textures. She also displayed oral holding of thins liquids. No overt s/sx aspiration were observed throughout intake of solids or liquids. Dys 2 (minced) solids were consumed with significantly longer oral transit in comparison to Dys 1 (puree). Recommend continue current Dys 1 (puree) diet, thin liquids, medicines crushed in purees, full supervision to ensure use of  slow rate, small bites, and use of liquid wash for  oral clearance.   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 With visual cues, pt able to follow 1-step basic commands with 100% accuracy, however accuracy reduced to <25% when visual aids withheld (and therefore language demand increased). She was unable to verbalize during in automatic speech tasks (ex: name, counting, etc.). Pt can imitate vowel sounds "ah" and an approximation of "ee" but unable to repeat word level verbalizations. She appeared intermittently aware of verbal errors. Pt able to identify objects by pointing when SLP provided object name with 3/5 accuracy, however accuracy decreased to 50% during responsive naming tasks. Pt noted to be impulsive and with deficits in basic functional problem solving during functional tasks, however due to severity of communication and swallowing impairments in combination with very short anticipated length of stay, priority for ST treatment will focus on dysphagia and speech/language and cognitive interventions likely deferred to next venue of care. Recommend skilled ST while inpatient to address above listed deficits in order to maximize functional independence and communication at discharge.    Skilled Therapeutic Interventions          Bedside swallow evaluation and cognitive-linguistic evaluations were completed and results were reviewed with pt. In-person Spanish interpretor was also present to assist with translation during evaluations.    SLP Assessment  Patient will need skilled Speech Lanaguage Pathology Services during CIR admission    Recommendations  SLP Diet Recommendations: Thin;Dysphagia 1 (Puree) Liquid Administration via: Cup;Straw Medication Administration: Crushed with puree Supervision: Patient able to self feed;Full supervision/cueing for compensatory strategies Compensations: Slow rate;Small  sips/bites Postural Changes and/or Swallow Maneuvers: Seated upright 90 degrees;Upright 30-60 min after meal Oral Care Recommendations: Oral care BID Patient destination: Home Follow up Recommendations: Home Health SLP;24 hour supervision/assistance Equipment Recommended: None recommended by SLP    SLP Frequency 3 to 5 out of 7 days   SLP Duration  SLP Intensity  SLP Treatment/Interventions 7 days  Minumum of 1-2 x/day, 30 to 90 minutes  Cognitive remediation/compensation;Cueing hierarchy;Internal/external aids;Functional tasks;Speech/Language facilitation;Dysphagia/aspiration precaution training;Patient/family education;Therapeutic Activities    Pain Pain Assessment Pain Scale: Faces Faces Pain Scale: Hurts a little bit Pain Type: Acute pain Pain Location: Shoulder Pain Orientation: Left Pain Descriptors / Indicators: Discomfort Pain Intervention(s): MD notified (Comment);Other (Comment)(MD in room and aware of pain)      SLP Evaluation Cognition Overall Cognitive Status: Impaired/Different from baseline Arousal/Alertness: Awake/alert Orientation Level: Oriented to person(difficult to fully assess due to global aphasia) Attention: Sustained Sustained Attention: Appears intact Memory: (UTA due to language) Awareness: Impaired Awareness Impairment: Emergent impairment Problem Solving: Impaired Problem Solving Impairment: Functional basic Executive Function: Reasoning;Decision Making;Initiating Reasoning: Impaired Reasoning Impairment: Functional basic Decision Making: Impaired Decision Making Impairment: Functional basic Initiating: Impaired Initiating Impairment: Functional basic Behaviors: Impulsive Safety/Judgment: Impaired  Comprehension Auditory Comprehension Overall Auditory Comprehension: Impaired Yes/No Questions: Impaired Basic Biographical Questions: 51-75% accurate Basic Immediate Environment Questions: 25-49% accurate Commands: Impaired Two Step  Basic Commands: 0-24% accurate Conversation: Simple Interfering Components: Processing speed EffectiveTechniques: Extra processing time;Repetition;Visual/Gestural cues Visual Recognition/Discrimination Discrimination: Not tested Reading Comprehension Reading Status: Not tested Expression Expression Primary Mode of Expression: Verbal Verbal Expression Overall Verbal Expression: Impaired Initiation: Impaired Automatic Speech: (none) Level of Generative/Spontaneous Verbalization: Word Repetition: Impaired Level of Impairment: Word level Naming: Impairment Responsive: 0-25% accurate Confrontation: Impaired Convergent: Not tested Divergent: Not tested Verbal Errors: Other (comment)(intermittently aware of errors)  Pragmatics: No impairment Non-Verbal Means of Communication: Gestures Written Expression Written Expression: Not tested Oral Motor Oral Motor/Sensory Function Overall Oral Motor/Sensory Function: Mild impairment Facial ROM: Reduced right;Suspected CN VII (facial) dysfunction Facial Symmetry: Within Functional Limits Facial Strength: Suspected CN VII (facial) dysfunction Facial Sensation: Reduced right Lingual ROM: Within Functional Limits Lingual Symmetry: Within Functional Limits Lingual Strength: Reduced Mandible: Within Functional Limits Motor Speech Overall Motor Speech: Impaired Respiration: Within functional limits Phonation: Normal Resonance: Within functional limits Articulation: Within functional limitis Intelligibility: Intelligible Motor Planning: Impaired Level of Impairment: Word Motor Speech Errors: Inconsistent(intermittently aware)  Bedside Swallowing Assessment General Date of Onset: 07/23/19 Previous Swallow Assessment: MBSS 3/1 Diet Prior to this Study: Thin liquids;Dysphagia 1 (puree) Temperature Spikes Noted: N/A Respiratory Status: Room air History of Recent Intubation: Yes Length of Intubations (days): 3 days Date extubated:  07/23/19 Behavior/Cognition: Alert;Cooperative;Pleasant mood;Requires cueing Oral Cavity - Dentition: Adequate natural dentition Self-Feeding Abilities: Able to feed self Vision: Functional for self-feeding Patient Positioning: Upright in bed Baseline Vocal Quality: Normal Volitional Cough: Cognitively unable to elicit Volitional Swallow: Unable to elicit  Oral Care Assessment Does patient have any of the following "high(er) risk" factors?: None of the above Does patient have any of the following "at risk" factors?: Other - dysphagia Patient is AT RISK: Order set for Adult Oral Care Protocol initiated -  "At Risk Patients" option selected (see row information) Patient is LOW RISK: Follow universal precautions (see row information) Ice Chips Ice chips: Not tested Thin Liquid Thin Liquid: Impaired Presentation: Cup;Straw Oral Phase Impairments: Reduced labial seal Oral Phase Functional Implications: Right anterior spillage Nectar Thick Nectar Thick Liquid: Not tested Honey Thick Honey Thick Liquid: Not tested Puree Puree: Impaired Presentation: Spoon;Self Fed Oral Phase Impairments: Impaired mastication;Reduced lingual movement/coordination Oral Phase Functional Implications: Prolonged oral transit Solid Solid: Impaired Presentation: Spoon;Self Fed Oral Phase Impairments: Reduced lingual movement/coordination;Impaired mastication BSE Assessment Risk for Aspiration Impact on safety and function: Mild aspiration risk Other Related Risk Factors: Cognitive impairment  Short Term Goals: Week 1: SLP Short Term Goal 1 (Week 1): STG=LTG due to ELOS  Refer to Care Plan for Long Term Goals  Recommendations for other services: None   Discharge Criteria: Patient will be discharged from SLP if patient refuses treatment 3 consecutive times without medical reason, if treatment goals not met, if there is a change in medical status, if patient makes no progress towards goals or if patient  is discharged from hospital.  The above assessment, treatment plan, treatment alternatives and goals were discussed and mutually agreed upon: by patient  Arbutus Leas 07/26/2019, 12:29 PM

## 2019-07-26 NOTE — Progress Notes (Signed)
Son [place] received education on Stroke, counting carbs, dash diet, and Living Well with Diabetes all in spanish per his request.

## 2019-07-26 NOTE — Patient Care Conference (Signed)
Inpatient RehabilitationTeam Conference and Plan of Care Update Date: 07/26/2019   Time: 10:20 AM    Patient Name: Jamie Mitchell      Medical Record Number: [id number]  Date of Birth: 08-Apr-1946 Sex: Female         Room/Bed: 4W19C/4W19C-01 Payor Info: Payor: MEDICARE / Plan: MEDICARE PART A AND B / Product Type: *No Product type* /    Admit Date/Time:  07/25/2019  3:02 PM  Primary Diagnosis:  Left middle cerebral artery stroke [facility])  Patient Active Problem List   Diagnosis Date Noted  . Renal failure 07/25/2019  . Prediabetes 07/25/2019  . 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 Dysphagia 07/25/2019  . 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 Acute respiratory failure ([place])   . Tachycardia-bradycardia syndrome ([place])   . Atrial fibrillation with RVR ([place])   . Hypertension 07/23/2015  . Hyperlipidemia 07/23/2015  . 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 Hypothyroidism 01/25/2014    Expected Discharge Date: Expected Discharge Date: (TBD)  Team Members Present: Physician leading conference: Dr. Leeroy Cha Care Coodinator Present: Nestor Lewandowsky, RN, BSN, CRRN;Genie Nour Scalise, RN, MSN Nurse Present: Isla Pence, RN PT Present: Barrie Folk, PT OT Present: Darleen Crocker, OT SLP Present: Jettie Booze, CF-SLP PPS Coordinator present : Gunnar Fusi, SLP     Current Status/Progress Goal Weekly Team Focus  Bowel/Bladder   Patient is continent of bowel and bladder  maintain cotinence of bowel and bladder  q 2 hours andf PRN toileting   Swallow/Nutrition/ Hydration   eval pending         ADL's   eval pending  eval pending  eval pending   Mobility   Eval pending  Eval pending      Communication   eval pending         Safety/Cognition/ Behavioral Observations  eval pending          Pain   Patient denies pain  maintain pain free  q shift and PRN pain assessment   Skin   Patient has no major current skin issues aside from ecchymosis in arms and scar in left lower back  maintain good skin condition  q shift and PRN skin assessment    Rehab Goals Patient on target to meet rehab goals: Yes *See Care Plan and progress notes for long and short-term goals.     Barriers to Discharge  Current Status/Progress Possible Resolutions Date Resolved   Nursing  Incontinence  has only been in [place] for approx 2 weeks prior to stroke. Son plans on having her home with him            PT  Medical stability;Home environment access/layout  expressive deficits              OT Other (comments)  none known at this time             [place] home environment;Incontinence Mobile home with steps to entry            Discharge Planning/Teaching Needs:  Home to son's home with help from spouse and daughter in law  TBD   Team Discussion: Global aphasia, using interpreter, monitoring BP, add lidocaine patch for shoulder.  RN inc on acute, impulsive, getting  OOB, no English, son is here, tylenol for pain, abrasions on back, Can DC midline per MD, up with CGA with nursing.  OT eval pending.  PT eval pending.  SLP expressive aphasia, use visual aids, comprehension deficits, impulsive, sustained attention, on D1 thins.   Revisions to Treatment Plan: N/A     Medical Summary Current Status: Jamie Mitchell continues to have severe global aphasia expressive >receptive, she is medically stable Weekly Focus/Goal: Continue intensive therapies, control of shoulder pain, nursing care, medical care, aphasia treatment and strategies  Barriers to Discharge: Medical stability   Possible Resolutions to Barriers: Caregiver eduation, continued medical management, intensive therapies and nursing care   Continued Need for Acute Rehabilitation Level of Care: The patient requires daily  medical management by a physician with specialized training in physical medicine and rehabilitation for the following reasons: Direction of a multidisciplinary physical rehabilitation program to maximize functional independence : Yes Medical management of patient stability for increased activity during participation in an intensive rehabilitation regime.: Yes Analysis of laboratory values and/or radiology reports with any subsequent need for medication adjustment and/or medical intervention. : Yes   I attest that I was present, lead the team conference, and concur with the assessment and plan of the team.   Retta Diones 07/27/2019, 9:54 AM   Team conference was held via web/ teleconference due to [place] - 19

## 2019-07-26 NOTE — Progress Notes (Signed)
[facility] PHYSICAL MEDICINE & REHABILITATION PROGRESS NOTE   Subjective/Complaints: Jamie Mitchell reports that she has some left sided shoulder pain-will add lidocaine patch. Her son has some questions regarding CBG testing.  Midline can be removed.   ROS: Limited due to aphasia.  Objective:   No results found. Recent Labs    07/25/19 0806 07/26/19 0831  WBC 6.8 7.3  HGB 13.4 14.8  HCT 40.6 44.6  PLT 230 226   Recent Labs    07/25/19 0806 07/26/19 0831  NA 140 138  K 3.9 4.0  CL 105 103  CO2 24 21*  GLUCOSE 122* 163*  BUN 23 22  CREATININE 0.80 0.90  CALCIUM 8.6* 8.7*    Intake/Output Summary (Last 24 hours) at 07/26/2019 1024 Last data filed at 07/26/2019 0820 Gross per 24 hour  Intake 399 ml  Output --  Net 399 ml     Physical Exam: Vital Signs Blood pressure 112/71, pulse 90, temperature 97.7 F (36.5 C), temperature source Oral, resp. rate 16, height 5\' 2"  (1.575 m), weight 70.5 kg, SpO2 91 %.  Nursing note and vitals reviewed. Constitutional: She appears well-developed and well-nourished.  Walked to bed from bathroom with CNA CGA with no AD; son at bedside; appropriate, no words but vocalizes some. NAD wearing her bifocals  HENT:  Head: Normocephalic and atraumatic.  Nose: Nose normal.  Mouth/Throat: Oropharynx is clear and moist.  Unable/couldn't stick tongue out- either didn't understand or wouldn't- giggled when asked 3x; R facial droop noted Facial sensation decreased on R side  Eyes: Pupils are equal, round, and reactive to light. Conjunctivae and EOM are normal.  No nystagmus B/L  Neck: No tracheal deviation present.  Cardiovascular:  RRR, no M/R/G  Respiratory: No stridor.  CTA B/L- no W/R/R  GI:  Abdomen soft, NT, ND- (+) BS; LBM last night  Musculoskeletal:     Cervical back: Normal range of motion and neck supple.     Comments: Pt very apraxic- couldn't participate in MS exam well attempted with son as interpretor but kept going the  other way from direction asked.   LUE ~5/5 in deltoid, biceps, triceps, grip and finger abd RUE- attempted all muscles, but only tested triceps 4/5, grip 4-/5 otherwise could not attempt with me LLE- 5/5 in HF, KE, KF, DF, and PF RLE- ~ 4/5 in HF and DF/PF   Neurological:  Patient is alert, sustains attention well. She has a left gaze preference and facial droop.  Appears globally aphasic.  Oriented x1 to self only.  She does follow some simple demonstrated commands. 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   Skin: Skin is warm and dry.  Unable to check backside, but arms, legs and feet have no skin issues. Son reports no skin breakdown that's been reported  Psychiatric:  Smiling intermittently    Assessment/Plan: 1. Functional deficits secondary to L MCA infarct which require 3+ hours per day of interdisciplinary therapy in a comprehensive inpatient rehab setting.  Physiatrist is providing close team supervision and 24 hour management of active medical problems listed below.  Physiatrist and rehab team continue to assess barriers to discharge/monitor patient progress toward functional and medical goals  Care Tool:  Bathing  Bathing activity did not  occur: Safety/medical concerns           Bathing assist       Upper Body Dressing/Undressing Upper body dressing   What is the patient wearing?: Hospital gown only    Upper body assist Assist Level: Minimal Assistance - Patient > 75%    Lower Body Dressing/Undressing Lower body dressing      What is the patient wearing?: Hospital gown only     Lower body assist Assist for lower body dressing: Supervision/Verbal cueing     Toileting Toileting    Toileting  assist Assist for toileting: Minimal Assistance - Patient > 75%     Transfers Chair/bed transfer  Transfers assist           Locomotion Ambulation   Ambulation assist              Walk 10 feet activity   Assist           Walk 50 feet activity   Assist           Walk 150 feet activity   Assist           Walk 10 feet on uneven surface  activity   Assist           Wheelchair     Assist               Wheelchair 50 feet with 2 turns activity    Assist            Wheelchair 150 feet activity     Assist          Blood pressure 112/71, pulse 90, temperature 97.7 F (36.5 C), temperature source Oral, resp. rate 16, height 5\' 2"  (1.575 m), weight 70.5 kg, SpO2 91 %.   Medical Problem List and Plan: 1.  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 Vs Bromocriptine for apraxia.              -patient may  shower             -ELOS/Goals: ~ 2weeks; mod I except for speech  -team conference today.  2.  Antithrombotics: -DVT/anticoagulation: SCDs- until OK'd by Neurology.              -antiplatelet therapy: Aspirin 81 mg daily 3. Pain Management: Tylenol as needed. Started Lidocaine patch for left shoulder.  4. Mood: Advised emotional support             -antipsychotic agents: N/A 5. Neuropsych: This patient is not capable of making decisions on her own behalf due to aphasia. 6. Skin/Wound Care: Routine skin checks. May DC midline.  7. Fluids/Electrolytes/Nutrition: Routine in and outs with follow-up chemistries 8.  Atrial fibrillation and flutter.  Amiodarone 200 mg twice daily x1 month then decrease to daily.PLAN TO START ELIQUIS 7-10 DAYS WITH RESOLUTION OF SAH. 9.  Permissive hypertension.  Monitor  with increased mobility.  Patient on HCTZ 25 mg daily prior to admission 10.  Dysphagia.  Dysphagia #1 thin liquid diet.  Nasogastric tube currently in place for nutritional support -removed Cortrak this afternoon  11.  Hypothyroidism.  TSH level 6.781- 7.052.  Free T4 normal 0.80.  Synthroid 37.5 mg daily -suggest increase in dose since this was home  dose.  12.  Hyperlipidemia.  Lipitor 13.  Prediabetes.  Hemoglobin A1c 6.1.  SSI. Will discuss further with son who has questions given first time on SSI.   LOS: 1 days A FACE TO FACE EVALUATION WAS PERFORMED  Jamie Mitchell P Amity Roes 07/26/2019, 10:24 AM

## 2019-07-26 NOTE — Progress Notes (Signed)
Inpatient Rehabilitation  Patient information reviewed and entered into eRehab system by Roark Rufo M. Aruna Nestler, M.A., CCC/SLP, PPS Coordinator.  Information including medical coding, functional ability and quality indicators will be reviewed and updated through discharge.    

## 2019-07-26 NOTE — Progress Notes (Signed)
Initial Nutrition Assessment  **RD working remotely**  DOCUMENTATION CODES:   Not applicable  INTERVENTION:  Ensure Enlive po TID, each supplement provides 350 kcal and 20 grams of protein   NUTRITION DIAGNOSIS:   Increased nutrient needs related to other (see comment)(therapies) as evidenced by estimated needs.  GOAL:   Patient will meet greater than or equal to 90% of their needs   MONITOR:   PO intake, Supplement acceptance, Weight trends, Diet advancement, Labs  REASON FOR ASSESSMENT:   Consult Enteral/tube feeding initiation and management  ASSESSMENT:   Pt with a PMH significant for HTN and hypothyroidism presented 07/19/2019 with acute left MCA infarct with SAH. Pt underwent revascularization per IR and remained intubated through 07/23/2019. Pt admitted to CIR on 07/25/19.  2/26 Cortrak (gastric) 3/1 MBS, DYS1/Thins  RD unable to reach pt via phone.   RD consulted for initiation of tube feeding. Pt previously being fed via Cortrak. After MBS and diet order placed, pt transitioned to nocturnal feedings to help meet calorie/protein needs until adequacy of PO intake could be assessed.   Discussed pt with CIR RN who reports pt's Cortrak was pulled prior to pt being admitted to CIR. RN also states that pt was noted to have vomited yesterday, but has had no issues today.   PO intake: 60-75% x 2 recorded meals  Medications reviewed and include: SSI Labs reviewed. CBGs 105-142   NUTRITION - FOCUSED PHYSICAL EXAM:  RD unable to perform at this time.    Diet Order:   Diet Order            DIET - DYS 1 Room service appropriate? Yes; Fluid consistency: Thin  Diet effective now              EDUCATION NEEDS:   No education needs have been identified at this time  Skin:  Skin Assessment: Reviewed RN Assessment  Last BM:  07/25/19  Height:   Ht Readings from Last 1 Encounters:  07/25/19 5\' 2"  (1.575 m)    Weight:   Wt Readings from Last 1 Encounters:   07/25/19 70.5 kg    BMI:  Body mass index is 28.43 kg/m.  Estimated Nutritional Needs:   Kcal:  1600-1800  Protein:  85-100 grams  Fluid:  >1.6L/d   Larkin Ina, MS, RD, LDN RD pager number and weekend/on-call pager number located in [place].

## 2019-07-26 NOTE — Progress Notes (Signed)
Patient is ambulatory 1 assist hand held when it comes to toileting. This morning bed alarm went on, writer found resident got up in bed to use the bathroom without calling assistance. Writer reinforce teaching about the use of call button and bed alarm is in middle setting. Nursing to continue monitoring patient behavior.

## 2019-07-27 ENCOUNTER — Inpatient Hospital Stay (HOSPITAL_COMMUNITY): Payer: Medicare Other | Admitting: Physical Therapy

## 2019-07-27 ENCOUNTER — Inpatient Hospital Stay (HOSPITAL_COMMUNITY): Payer: Medicare Other | Admitting: Speech Pathology

## 2019-07-27 LAB — GLUCOSE, CAPILLARY
Glucose-Capillary: 109 mg/dL — ABNORMAL HIGH (ref 70–99)
Glucose-Capillary: 106 mg/dL — ABNORMAL HIGH (ref 70–99)
Glucose-Capillary: 84 mg/dL (ref 70–99)
Glucose-Capillary: 142 mg/dL — ABNORMAL HIGH (ref 70–99)

## 2019-07-27 MED ORDER — AMANTADINE HCL 100 MG PO CAPS
100.0000 mg | ORAL_CAPSULE | Freq: Two times a day (BID) | ORAL | Status: DC
  Administered 2019-07-27 – 2019-07-28 (×4): 100 mg via ORAL
  Filled 2019-07-27 (×3): qty 1

## 2019-07-27 MED ORDER — DICLOFENAC SODIUM 1 % EX GEL
2.0000 g | Freq: Four times a day (QID) | CUTANEOUS | Status: DC | PRN
  Filled 2019-07-27: qty 100

## 2019-07-27 MED ORDER — NON FORMULARY: 3.0000 mg | Freq: Every evening | Status: DC | PRN

## 2019-07-27 MED ORDER — MELATONIN 3 MG PO TABS
3.0000 mg | ORAL_TABLET | Freq: Every evening | ORAL | Status: DC | PRN
  Filled 2019-07-27: qty 1

## 2019-07-27 NOTE — Progress Notes (Signed)
[facility] PHYSICAL MEDICINE & REHABILITATION PROGRESS NOTE   Subjective/Complaints: [name] reports that she has some left sided shoulder pain. She has received the lidocaine patch Her son has received education on stroke, counting carbs, Sash diet, Living well from [place]  ROS: Limited due to aphasia.  Objective:   No results found. Recent Labs    07/25/19 0806 07/26/19 0831  WBC 6.8 7.3  HGB 13.4 14.8  HCT 40.6 44.6  PLT 230 226   Recent Labs    07/25/19 0806 07/26/19 0831  NA 140 138  K 3.9 4.0  CL 105 103  CO2 24 21*  GLUCOSE 122* 163*  BUN 23 22  CREATININE 0.80 0.90  CALCIUM 8.6* 8.7*    Intake/Output Summary (Last 24 hours) at 07/27/2019 0910 Last data filed at 07/26/2019 1805 Gross per 24 hour  Intake 462 ml  Output --  Net 462 ml     Physical Exam: Vital Signs Blood pressure 114/73, pulse 61, temperature (!) 97.3 F (36.3 C), temperature source Oral, resp. rate 18, height 5\' 2"  (1.575 m), weight 70.5 kg, SpO2 93 %.  Nursing note and vitals reviewed. Constitutional: She appears well-developed and well-nourished.  Walked to bed from bathroom with CNA CGA with no AD; son at bedside; appropriate, no words but vocalizes some. NAD wearing her bifocals  HENT:  Head: Normocephalic and atraumatic.  Nose: Nose normal.  Mouth/Throat: Oropharynx is clear and moist.  Unable/couldn't stick tongue out- either didn't understand or wouldn't- giggled when asked 3x; R facial droop noted Facial sensation decreased on R side  Eyes: Pupils are equal, round, and reactive to light. Conjunctivae and EOM are normal.  No nystagmus B/L  Neck: No tracheal deviation present.  Cardiovascular:  RRR, no M/R/G  Respiratory: No stridor.  CTA B/L- no W/R/R  GI:  Abdomen soft, NT, ND- (+) BS; LBM last night  Musculoskeletal:     Cervical back: Normal range of motion and neck supple.     Comments: Pt very apraxic- couldn't participate in MS exam well attempted with  son as interpretor but kept going the other way from direction asked.   LUE ~5/5 in deltoid, biceps, triceps, grip and finger abd RUE- attempted all muscles, but only tested triceps 4/5, grip 4-/5 otherwise could not attempt with me LLE- 5/5 in HF, KE, KF, DF, and PF RLE- ~ 4/5 in HF and DF/PF   Neurological:  Patient is alert, sustains attention well. She has a left gaze preference and facial droop.  Appears globally aphasic.  Oriented x1 to self only.  She does follow some simple demonstrated commands. Ability to communicate continues to improve.  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   Skin: Skin is warm and dry.  Unable to check backside, but arms, legs and feet have no skin issues. Son reports no skin breakdown that's been reported  Psychiatric:  Smiling intermittently    Assessment/Plan: 1. Functional deficits secondary to L MCA infarct which require 3+ hours per day of interdisciplinary therapy in a comprehensive inpatient rehab setting.  Physiatrist is providing close team supervision and 24 hour management of active medical problems listed below.  Physiatrist and rehab team continue to assess barriers to discharge/monitor patient progress toward  functional and medical goals  Care Tool:  Bathing  Bathing activity did not occur: Safety/medical concerns Body parts bathed by patient: Right arm, Left arm, Chest, Abdomen, Front perineal area, Buttocks, Right upper leg, Left upper leg, Right lower leg, Left lower leg, Face         Bathing assist Assist Level: Contact Guard/Touching assist     Upper Body Dressing/Undressing Upper body dressing   What is the patient wearing?: Pull over shirt    Upper body assist  Assist Level: Supervision/Verbal cueing    Lower Body Dressing/Undressing Lower body dressing      What is the patient wearing?: Underwear/pull up, Pants     Lower body assist Assist for lower body dressing: Contact Guard/Touching assist     Toileting Toileting    Toileting assist Assist for toileting: Contact Guard/Touching assist     Transfers Chair/bed transfer  Transfers assist     Chair/bed transfer assist level: Contact Guard/Touching assist     Locomotion Ambulation   Ambulation assist      Assist level: Minimal Assistance - Patient > 75% Assistive device: No Device Max distance: 130   Walk 10 feet activity   Assist     Assist level: Minimal Assistance - Patient > 75% Assistive device: No Device   Walk 50 feet activity   Assist    Assist level: Minimal Assistance - Patient > 75% Assistive device: No Device    Walk 150 feet activity   Assist Walk 150 feet activity did not occur: Safety/medical concerns         Walk 10 feet on uneven surface  activity   Assist     Assist level: Minimal Assistance - Patient > 75% Assistive device: Hand held assist   Wheelchair     Assist Will patient use wheelchair at discharge?: No Type of Wheelchair: Manual    Wheelchair assist level: Minimal Assistance - Patient > 75% Max wheelchair distance: 150    Wheelchair 50 feet with 2 turns activity    Assist        Assist Level: Minimal Assistance - Patient > 75%   Wheelchair 150 feet activity     Assist      Assist Level: Minimal Assistance - Patient > 75%   Blood pressure 114/73, pulse 61, temperature (!) 97.3 F (36.3 C), temperature source Oral, resp. rate 18, height 5\' 2"  (1.575 m), weight 70.5 kg, SpO2 93 %.   Medical Problem List and Plan: 1.  Right-sided weakness with aphasia/dysphagia secondary to left MCA infarct due to left M2 occlusion status post TPA and IR revascularization with focal SAH, infarct embolic  secondary newly diagnosed atrial fibrillation             -suggest Amantadine for aphasia. Vs Bromocriptine for apraxia.              -patient may  shower             -ELOS/Goals: ~ 2weeks; mod I except for speech  -3/4 started Amantadine 100mg  BID.  2.  Antithrombotics: -DVT/anticoagulation: SCDs- until OK'd by Neurology.              -antiplatelet therapy: Aspirin 81 mg daily 3. Pain Management: Tylenol as needed. Started Lidocaine patch for left shoulder.   3/4: Ordered diclofenac gel for left shoulder pain.  4. Mood: Advised emotional support             -antipsychotic agents: N/A 5. Neuropsych: This patient is not capable  of making decisions on her own behalf due to aphasia. 6. Skin/Wound Care: Routine skin checks. May DC midline.  7. Fluids/Electrolytes/Nutrition: Routine in and outs with follow-up chemistries 8.  Atrial fibrillation and flutter.  Amiodarone 200 mg twice daily x1 month then decrease to daily.PLAN TO START ELIQUIS 7-10 DAYS WITH RESOLUTION OF SAH. 9.  Permissive hypertension.  Monitor with increased mobility.  Patient on HCTZ 25 mg daily prior to admission. 10.  Dysphagia.  Dysphagia #1 thin liquid diet.  Nasogastric tube currently in place for nutritional support -removed Cortrak this afternoon  11.  Hypothyroidism.  TSH level 6.781- 7.052.  Free T4 normal 0.80.  Synthroid 37.5 mg daily -suggest increase in dose since this was home dose.  12.  Hyperlipidemia.  Lipitor 13.  Prediabetes.  Hemoglobin A1c 6.1.  SSI. Will discuss further with son who has questions given first time on SSI.   3/4: Nursing has provided son with education regarding stroke, carb counting, and DASH diet.  14. Insomnia: 3/4: Ordered Melatonin 3mg  PRN HS.  LOS: 2 days A FACE TO FACE EVALUATION WAS PERFORMED  Jamie Mitchell P Jamie Mitchell 07/27/2019, 9:10 AM

## 2019-07-27 NOTE — Progress Notes (Signed)
Physical Therapy Session Note  Patient Details  Name: Jamie Mitchell MRN: [id number] Date of Birth: February 25, 1946  Today's Date: 07/27/2019 PT Individual Time: 1540-0867 PT Individual Time Calculation (min): 59 min   Short Term Goals: Week 1:  PT Short Term Goal 1 (Week 1): STG=LTG due to ELOS  Skilled Therapeutic Interventions/Progress Updates:   Pt received sitting in WC and agreeable to PT  Gait training over level surface through rehab unit with no AD and supervision assist  2 x 185f +150. Pt also instructed in dynamic gait training to step over 1 inch obstacles on floor 2 x 4. Pt also navigateed 1 inch obstacles x 4 And to weave through cones x 3 for bouts.    Dynamic balance training to complete fine motor task of connnect 4 to complete pattern using the RUE while standing on wedge. 2 x 153m on red wedge and 2 x 71m66mblue wedge. CGA progressing to supervision assist to maintain balance as well as cues to prevent using LUE for support.   Pt performed 6 min walk test, total [id number] supervision assist throughout, and 1 rest break at approximately 3 minutes. Mild decrease in weight shifting noted when fatigued, causing decreased step length on the L.   nustep reciprocal movement training 4 min + 2 min with prolonged therapeutic rest break and min-mod cues for continuation of task and improved LE ROM to maintain symmetry RvL.    Pt returned to room and performed ambulatory transfer to bed with supervision assist. Sit>supine completed with supervision assist and min cues for positioning in bed to improve safety, and left supine in bed with call bell in reach and all needs met.          Therapy Documentation Precautions:  Precautions Precautions: Fall Restrictions Weight Bearing Restrictions: No Vital Signs: Therapy Vitals Temp: 98.1 F (36.7 C) Pulse Rate: 87 BP: 102/84 Oxygen Therapy SpO2: 95 % Pain: Pain Assessment Pain Scale: 0-10 Pain Score: 0-No  pain   Therapy/Group: Individual Therapy  AustLorie Phenix/2021, 2:06 PM

## 2019-07-27 NOTE — Progress Notes (Signed)
Speech Language Pathology Daily Session Note  Patient Details  Name: Jamie Mitchell MRN: [id number] Date of Birth: 07/14/1945  Today's Date: 07/27/2019 SLP Individual Time: 1001-1100 SLP Individual Time Calculation (min): 59 min  Short Term Goals: Week 1: SLP Short Term Goal 1 (Week 1): STG=LTG due to ELOS  Skilled Therapeutic Interventions: Pt was seen for skilled ST targeting dysphagia and speech goals. Spanish interpretor was present throughout session. Pt demonstrated fully efficient mastication and oral clearance of puree applesauce. During upgraded trials of Dys 2 (minced/ground) solids, pt required Mod A verbal cues for use of liquid wash to clear Min oral residue and reduce impulsivity during intake. However, with use of liquid wash total oral clearance was achieved. Recommend continue current diet, but ST will continue to provide opportunities for advancement.  During speech tasks, pt demonstrated ability to imitate "m" and "ah" sounds. She then alternated between the 2 with Max A multimodal cues - she was very receptive to verbal cues for articulatory placement (open mouth, close mouth with lips together). Pt vocalized during counting automatic speech tasks, however unable to produce any intelligible verbalizations, likely due to motor planning, apraxia of speech. She is using head nod yes and no to communicate preferences, basic wants and needs with Mod A verbal cues. Pt also matched common objects from field of 2 to words with 70% accuracy in initial attempt, increasing to 80% accuracy when activity repeated. Although writing to dictation and copy were largely unsuccessful, pt could trace her name. Pt left laying in bed with alarm set and needs within reach. Continue per current plan of care.       Pain Pain Assessment Pain Scale: 0-10 Pain Score: 0-No pain  Therapy/Group: Individual Therapy  Arbutus Leas 07/27/2019, 12:26 PM

## 2019-07-27 NOTE — Progress Notes (Signed)
Inpatient Diabetes Program Recommendations  AACE/ADA: New Consensus Statement on Inpatient Glycemic Control (2015)  Target Ranges:  Prepandial:   less than 140 mg/dL      Peak postprandial:   less than 180 mg/dL (1-2 hours)      Critically ill patients:  140 - 180 mg/dL   Lab Results  Component Value Date   GLUCAP 106 (H) 07/27/2019   HGBA1C 6.1 (H) 07/20/2019   HGBA1C 6.0 (H) 07/20/2019     Noted consult.   Spoke with Minong, patient's sone regarding prediabetes diagnosis.  Reviewed patient's current A1c of 6.1%. Explained what a A1c is and what it measures. Also reviewed goal A1c with patient, importance of good glucose control @ home, and blood sugar goals. Reviewed patho of DM, current inpatient goals, vascular changes, and commorbidities. Patient will need a meter at discharge. Blood glucose meter kit (includes strips and lancets) (#[id number]). Encouraged to check once per day and reviewed when to call MD.  Discussed carb counting, selecting foods based on nutritional value and the plate method. Reviewed foods that were higher in carbohydrates and to focus on portion control. Patient does drink juice, so reviewed alternatives.  As able, encouraged activity and overall impact in individuals with diabetes.  Patient's son plans to make follow up with PCP in next few weeks, has no further questions at this time.   Thanks, Bronson Curb, MSN, RNC-OB Diabetes Coordinator [phone] (8a-5p)

## 2019-07-27 NOTE — Care Management (Signed)
Inpatient Rehabilitation Center Individual Statement of Services  Patient Name:  Jamie Mitchell  Date:  07/27/2019  Welcome to the [facility].  Our goal is to provide you with an individualized program based on your diagnosis and situation, designed to meet your specific needs.  With this comprehensive rehabilitation program, you will be expected to participate in at least 3 hours of rehabilitation therapies Monday-Friday, with modified therapy programming on the weekends.  Your rehabilitation program will include the following services:  Physical Therapy (PT), Occupational Therapy (OT), Speech Therapy (ST), 24 hour per day rehabilitation nursing, Therapeutic Recreaction (TR), Neuropsychology, Case Management (Social Worker), Rehabilitation Medicine, Nutrition Services and Pharmacy Services  Weekly team conferences will be held on Wednesdays to discuss your progress.  Your Social Industrial/product designer will talk with you frequently to get your input and to update you on team discussions.  Team conferences with you and your family in attendance may also be held.  Expected length of stay: 7 days Overall anticipated outcome: Supervision goals  Depending on your progress and recovery, your program may change. Your Social Industrial/product designer will coordinate services and will keep you informed of any changes. Your Social Worker's/Care Manager's name and contact numbers are listed  below.  The following services may also be recommended but are not provided by the [facility]:    [place] will be made to provide these services after discharge if needed.  Arrangements include referral to agencies that provide these services.  Your insurance has been verified to be:  Medicare Your primary doctor is:  Fredia Beets, FNP.  Pertinent information will be shared with your doctor  and your insurance company.  Social Worker:  Loralee Pacas, [place] or ([phone] Care Manager: Dorien Chihuahua, RN [phone] or ([phone]  Information discussed with and copy given to patient by: Margarito Liner, 07/27/2019, 9:48 AM

## 2019-07-28 ENCOUNTER — Inpatient Hospital Stay (HOSPITAL_COMMUNITY): Payer: Medicare Other | Admitting: Speech Pathology

## 2019-07-28 ENCOUNTER — Inpatient Hospital Stay (HOSPITAL_COMMUNITY): Payer: Medicare Other | Admitting: Physical Therapy

## 2019-07-28 ENCOUNTER — Inpatient Hospital Stay (HOSPITAL_COMMUNITY): Payer: Medicare Other | Admitting: Occupational Therapy

## 2019-07-28 LAB — BASIC METABOLIC PANEL
Anion gap: 11 (ref 5–15)
BUN: 23 mg/dL (ref 8–23)
CO2: 21 mmol/L — ABNORMAL LOW (ref 22–32)
Calcium: 8.5 mg/dL — ABNORMAL LOW (ref 8.9–10.3)
Chloride: 105 mmol/L (ref 98–111)
Creatinine, Ser: 1.05 mg/dL — ABNORMAL HIGH (ref 0.44–1.00)
GFR calc Af Amer: >60 mL/min (ref >60)
GFR calc non Af Amer: 53 mL/min — ABNORMAL LOW (ref >60)
Glucose, Bld: 166 mg/dL — ABNORMAL HIGH (ref 70–99)
Potassium: 3.9 mmol/L (ref 3.5–5.1)
Sodium: 137 mmol/L (ref 135–145)

## 2019-07-28 LAB — GLUCOSE, CAPILLARY
Glucose-Capillary: 99 mg/dL (ref 70–99)
Glucose-Capillary: 107 mg/dL — ABNORMAL HIGH (ref 70–99)
Glucose-Capillary: 104 mg/dL — ABNORMAL HIGH (ref 70–99)
Glucose-Capillary: 187 mg/dL — ABNORMAL HIGH (ref 70–99)

## 2019-07-28 MED ORDER — AMIODARONE HCL 200 MG PO TABS
400.0000 mg | ORAL_TABLET | Freq: Two times a day (BID) | ORAL | Status: DC
  Administered 2019-07-28 – 2019-08-03 (×12): 400 mg via ORAL
  Filled 2019-07-28 (×12): qty 2

## 2019-07-28 NOTE — Progress Notes (Signed)
The patient went back to atrial flutter this am, ventricular rates in 120' while she is exercising, currently 105 at rest. The patient is asymptomatic. I will increase her dose of amiodarone to 400 mg po BID, her BP is soft, I won't be able to add beta-blockers. K on 3/3  4.0.   We will follow her, ECG is ordered for the morning. I will also order BMP and Mg for the morning labs.  Ena Dawley, MD 07/28/2019

## 2019-07-28 NOTE — Progress Notes (Signed)
Speech Language Pathology Daily Session Note  Patient Details  Name: Jamie Mitchell MRN: [id number] Date of Birth: April 11, 1946  Today's Date: 07/28/2019 SLP Individual Time: 1003-1059 SLP Individual Time Calculation (min): 56 min  Short Term Goals: Week 1: SLP Short Term Goal 1 (Week 1): STG=LTG due to ELOS  Skilled Therapeutic Interventions: Pt was seen for skilled ST targeting dysphagia and communication goals. Spanish interpretor as well as pt's son present throughout session. [name] asked appropriate questions regarding ways to assist with communication and regarding long term prognosis for speech. Pt consumed Dys 2 (minced/ground) solid snack with Min A cues for use of liquid wash for oral clearance - efficient mastication and oral clearance was achieved. [name] was also trained to provide supervision with meals. Recommend upgrade to Dys 2 solid diet, continue thin liquids.  Pt imitated phonemes "ah", "m" and "oh" today with Max A multimodal cues. She also shaped phonemes into 1 CV combination - "ma". During tasks targeting automatic speech, also demonstrated ability to hum along to the happy birthday song with Max A modeling. Provided Mod A verbal/visual cues, pt matched common objects (from field of 2) to words with 70% accuracy. Pt left laying in bed with alarm set and needs within reach, son present.      Pain Pain Assessment Pain Scale: 0-10 Pain Score: 0-No pain  Therapy/Group: Individual Therapy  Arbutus Leas 07/28/2019, 12:33 PM

## 2019-07-28 NOTE — Anesthesia Postprocedure Evaluation (Signed)
Anesthesia Post Note  Patient: Jamie Mitchell  Procedure(s) Performed: IR WITH ANESTHESIA (N/A )     Patient location during evaluation: SICU Anesthesia Type: General Level of consciousness: sedated Pain management: pain level controlled Vital Signs Assessment: post-procedure vital signs reviewed and stable Respiratory status: patient remains intubated per anesthesia plan Cardiovascular status: stable Postop Assessment: no apparent nausea or vomiting Anesthetic complications: no    Last Vitals:  Vitals:   07/25/19 0752 07/25/19 1203  BP: 115/81 108/82  Pulse: (!) 57 75  Resp: 18 18  Temp: 36.9 C 37 C  SpO2: 90% 95%    Last Pain:  Vitals:   07/25/19 1203  TempSrc: Oral  PainSc:                  Jamie Mitchell

## 2019-07-28 NOTE — Progress Notes (Addendum)
Patient reported to have dizzy episodes with PT and today after going to the BR. Vitals on dynamap showed HR in 130's. On evaluation -heart rate IRIR and at 98 bpm for one minute. Patient sitting in a chair--calm and NAD.  --no reports of chest pain, palpitations or SOB per son (interperter). EKG ordered. Amantadine added yesterday--discontinued. Recommended giving patient evening dose amiodarone.  Dr. Adam Phenix updated on events.

## 2019-07-28 NOTE — Progress Notes (Signed)
Occupational Therapy Session Note  Patient Details  Name: Jamie Mitchell MRN: [id number] Date of Birth: 1946-01-24  Today's Date: 07/28/2019 OT Individual Time: [phone] OT Individual Time Calculation (min): 57 min   Short Term Goals: Week 1:  OT Short Term Goal 1 (Week 1): STGs=LTGs secondary to short estimated LOS  Skilled Therapeutic Interventions/Progress Updates:     Pt greeted in the recliner, breakfast tray just arrived. OT cut up her food to promote small bites per swallowing instructions, pt did well with taking small sips of juice and milk. Throughout meal, pt used her Rt hand at dominant level to manage utensils without needing built up adaptations. Afterwards pt was agreeable to shower. She ambulated without device and min guard assist, vcs for slowing pace. She bathed while seated on TTB. Pt initiated washing hair first. Min demonstrational cues to add soap to wash cloth before washing other body areas, but then she completed subsequent bathing tasks with min tactile cuing for thoroughness. When she was finished, CGA for donning gripper socks while sitting edge of tub bench. Pt then continued with dressing tasks sit<stand from standard toilet. OT set up her clothing items on the Lt bar with pt self sequencing with min visual cuing. CGA for dynamic sitting and standing balance. Pt initiated putting her soiled clothing back into the dresser drawer. Pt once again ambulating with min guard assist to do so. Min visual cuing for sequencing during oral care when standing at the sink. Setup for hair brushing with presentation of personal hair brush. Pt returned to the recliner and interpretor attempted to communicate with pt, however pt responding minimally to questions regarding prior interests and hobbies. She returned to bed with steady assist. Left her with all needs within reach and bed alarm set.    Interpretor present during session   Therapy Documentation Precautions:   Precautions Precautions: Fall Restrictions Weight Bearing Restrictions: No Pain: When asked about pain, pt tapped Lt shoulder, shook her head and then waved her hands. Offered to ask RN about pain medicine to address however pt shook her head and waved her hands again   ADL:        Therapy/Group: Individual Therapy  Javeion Cannedy A Taray Normoyle 07/28/2019, 12:18 PM

## 2019-07-28 NOTE — Progress Notes (Signed)
[place] PHYSICAL MEDICINE & REHABILITATION PROGRESS NOTE   Subjective/Complaints: [name] has no complaints this morning. Denies pain, constipation. Sleeping well at night.  Appreciate diabetes coordinator counseling of patient and her son.   ROS: Limited due to aphasia.  Objective:   No results found. Recent Labs    07/26/19 0831  WBC 7.3  HGB 14.8  HCT 44.6  PLT 226   Recent Labs    07/26/19 0831  NA 138  K 4.0  CL 103  CO2 21*  GLUCOSE 163*  BUN 22  CREATININE 0.90  CALCIUM 8.7*    Intake/Output Summary (Last 24 hours) at 07/28/2019 0948 Last data filed at 07/28/2019 0846 Gross per 24 hour  Intake 720 ml  Output --  Net 720 ml     Physical Exam: Vital Signs Blood pressure (P) 117/62, pulse (P) 81, temperature (!) 97.5 F (36.4 C), resp. rate 16, height 5' 2"  (1.575 m), weight 70.5 kg, SpO2 (P) 97 %.  Nursing note and vitals reviewed. Constitutional: She appears well-developed and well-nourished.  Walked to bed from bathroom with CNA CGA with no AD; son at bedside; appropriate, no words but vocalizes some. NAD wearing her bifocals  HENT:  Head: Normocephalic and atraumatic.  Nose: Nose normal.  Mouth/Throat: Oropharynx is clear and moist.  Unable/couldn't stick tongue out- either didn't understand or wouldn't- giggled when asked 3x R facial droop noted Facial sensation decreased on R side  Eyes: Pupils are equal, round, and reactive to light. Conjunctivae and EOM are normal.  No nystagmus B/L  Neck: No tracheal deviation present.  Cardiovascular:  RRR, no M/R/G  Respiratory: No stridor.  CTA B/L- no W/R/R  GI:  Abdomen soft, NT, ND- (+) BS; LBM last night  Musculoskeletal:     Cervical back: Normal range of motion and neck supple.     Comments: Much improved ability to tolerate exam and comprehension of conversation.  LUE ~5/5 in deltoid, biceps, triceps, grip and finger abd RUE- attempted all muscles, but only tested triceps 4/5, grip 4-/5  otherwise could not attempt with me LLE- 5/5 in HF, KE, KF, DF, and PF RLE- ~ 4/5 in HF and DF/PF   Neurological:  Patient is alert, sustains attention well. She has a left gaze preference and facial droop.  Appears globally aphasic.  Oriented x1 to self only.  She does follow some simple demonstrated commands. Ability to communicate continues to improve.  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   Skin: Skin is warm and dry.  Unable to check backside, but arms, legs and feet have no skin issues. Son reports no skin breakdown that's been reported  Psychiatric:  Smiling intermittently    Assessment/Plan: 1. Functional deficits secondary to L MCA infarct which require 3+ hours per day of interdisciplinary therapy in a comprehensive inpatient rehab setting.  Physiatrist is providing close team supervision and 24 hour management of active medical problems listed below.  Physiatrist and rehab team continue to assess barriers to discharge/monitor patient progress toward functional and medical goals  Care Tool:  Bathing  Bathing activity did not occur: Safety/medical concerns Body parts bathed by patient: Right arm, Left arm, Chest, Abdomen, Front perineal area, Buttocks, Right upper leg, Left upper leg, Right  lower leg, Left lower leg, Face         Bathing assist Assist Level: Supervision/Verbal cueing     Upper Body Dressing/Undressing Upper body dressing   What is the patient wearing?: Pull over shirt, Bra    Upper body assist Assist Level: Supervision/Verbal cueing    Lower Body Dressing/Undressing Lower body dressing      What is the patient wearing?: Underwear/pull up, Pants     Lower body assist Assist for  lower body dressing: Contact Guard/Touching assist     Toileting Toileting Toileting Activity did not occur (Clothing management and hygiene only): Refused  Toileting assist Assist for toileting: Contact Guard/Touching assist     Transfers Chair/bed transfer  Transfers assist     Chair/bed transfer assist level: Supervision/Verbal cueing     Locomotion Ambulation   Ambulation assist      Assist level: Supervision/Verbal cueing Assistive device: No Device Max distance: 330   Walk 10 feet activity   Assist     Assist level: Supervision/Verbal cueing Assistive device: No Device   Walk 50 feet activity   Assist    Assist level: Supervision/Verbal cueing Assistive device: No Device    Walk 150 feet activity   Assist Walk 150 feet activity did not occur: Safety/medical concerns  Assist level: Supervision/Verbal cueing Assistive device: No Device    Walk 10 feet on uneven surface  activity   Assist     Assist level: Minimal Assistance - Patient > 75% Assistive device: Hand held assist   Wheelchair     Assist Will patient use wheelchair at discharge?: No Type of Wheelchair: Manual    Wheelchair assist level: Minimal Assistance - Patient > 75% Max wheelchair distance: 150    Wheelchair 50 feet with 2 turns activity    Assist        Assist Level: Minimal Assistance - Patient > 75%   Wheelchair 150 feet activity     Assist      Assist Level: Minimal Assistance - Patient > 75%   Blood pressure (P) 117/62, pulse (P) 81, temperature (!) 97.5 F (36.4 C), resp. rate 16, height 5' 2"  (1.575 m), weight 70.5 kg, SpO2 (P) 97 %.   Medical Problem List and Plan: 1.  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 Vs  Bromocriptine for apraxia.              -patient may  shower             -ELOS/Goals: ~ 2weeks; mod I except for speech  -3/4 started Amantadine 153m BID.  2.  Antithrombotics: -DVT/anticoagulation: SCDs- until OK'd by Neurology.              -antiplatelet therapy: Aspirin 81 mg daily 3. Pain Management: Tylenol as needed. Started Lidocaine patch for left shoulder.   3/4: Ordered diclofenac gel for left shoulder pain.  4. Mood: Advised emotional support             -antipsychotic agents: N/A 5. Neuropsych: This patient is not capable of making decisions on her own behalf due to aphasia. 6. Skin/Wound Care: Routine skin checks. May DC midline.  7. Fluids/Electrolytes/Nutrition: Routine in and outs with follow-up chemistries 8.  Atrial fibrillation and flutter.  Amiodarone 200 mg  twice daily x1 month then decrease to daily.PLAN TO START ELIQUIS 7-10 DAYS WITH RESOLUTION OF SAH. 9.  Permissive hypertension.  Monitor with increased mobility.  Patient on HCTZ 25 mg daily prior to admission.  3/5: currently very well controlled. Does not require the HCTZ.  10.  Dysphagia.  Dysphagia #1 thin liquid diet.  Nasogastric tube currently in place for nutritional support -removed Cortrak this week and tolerating well.  11.  Hypothyroidism.  TSH level 6.781- 7.052.  Free T4 normal 0.80.  Synthroid 37.5 mg daily -suggest increase in dose since this was home dose.  12.  Hyperlipidemia.  Lipitor 13.  Prediabetes.  Hemoglobin A1c 6.1.  SSI. Will discuss further with son who has questions given first time on SSI.   3/4: Nursing has provided son with education regarding stroke, carb counting, and DASH diet.   3/5: Appreciate diabetes coordinator meeting with patient and her son and providing education. Patient will need glucometer kit upon discharge.  14. Insomnia: 3/4: Ordered Melatonin 40m PRN HS.  LOS: 3 days A FACE TO FACE EVALUATION WAS PERFORMED  KClide DeutscherRaulkar 07/28/2019, 9:48 AM

## 2019-07-28 NOTE — Progress Notes (Signed)
Physical Therapy Session Note  Patient Details  Name: Jamie Mitchell MRN: [id number] Date of Birth: [birth year]  Today's Date: 07/28/2019 PT Individual Time: 1300-1400 PT Individual Time Calculation (min): 60 min   Short Term Goals: Week 1:  PT Short Term Goal 1 (Week 1): STG=LTG due to ELOS  Skilled Therapeutic Interventions/Progress Updates:   Pt received sitting in WC and agreeable to PT  Gait training through hall without AD and supervision assist overvall 3 x 173f. Noted to have 2 near LOB, but able to correct without addition assist from PT.   Dynamic balance training to perform reciprocal foot taps on 6 inch step 2 x 20 BLE and toe tap on 2 alternating targets 2 x 12 BLE. CGA from PT to maintain balance and improve weight  Shifting while in stance on the RLE. Ball bounce off target 2 x 1.5 min with supervision assist and min cues for improved force of through to improve safety and success with catch off rebound.   agility ladder 1 foot in each space x 8. Side stepping R and L x 3 Bil. CGA from PT with multimodal cues for increased step length and improved weight shifting.   Dynavision 2 min, 5 ring s x 4 bouts, 16, 19, 20 and 33. Pt noted to have no difficulty pressing target with L hand, but required mod progressing to min assist to press correct target with the R hand. Increased time to locate targets in the R visual field, requiring cues for improved visual scanning initially.   Pt returned to room and performed Ambulatory transfer to bed with supervision assist. Sit>supine completed without assist, and left supine in bed with call bell in reach and all needs met.        Therapy Documentation Precautions:  Precautions Precautions: Fall Restrictions Weight Bearing Restrictions: No   Pain: Pain Assessment Pain Scale: 0-10 Pain Score: 0-No pain    Therapy/Group: Individual Therapy  ALorie Phenix3/09/2019, 2:05 PM

## 2019-07-28 NOTE — Care Management (Signed)
Patient Details  Name: Jamie Mitchell MRN: [id number] Date of Birth: Nov 04, 1945  Today's Date: 07/28/2019  Problem List:  Patient Active Problem List   Diagnosis Date Noted  . Renal failure 07/25/2019  . Prediabetes 07/25/2019  . Left middle cerebral artery stroke ([place]) 07/25/2019  . 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 Dysphagia 07/25/2019  . 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 Acute respiratory failure ([place])   . Tachycardia-bradycardia syndrome ([place])   . Atrial fibrillation with RVR ([place])   . Hypertension 07/23/2015  . Hyperlipidemia 07/23/2015  . 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 Hypothyroidism 01/25/2014   Past Medical History:  Past Medical History:  Diagnosis Date  . Hypertension   . Thyroid disease    Past Surgical History:  Past Surgical History:  Procedure Laterality Date  . IR CT HEAD LTD  07/20/2019  . IR PERCUTANEOUS ART THROMBECTOMY/INFUSION INTRACRANIAL INC DIAG ANGIO  07/20/2019  . RADIOLOGY WITH ANESTHESIA N/A 07/20/2019   Procedure: IR WITH ANESTHESIA;  Surgeon: Luanne Bras, MD;  Location: [facility];  Service: Radiology;  Laterality: N/A;   Social History:  reports that she has never smoked. She has never used smokeless tobacco. She reports that she does not drink alcohol or use drugs.  Family / Support Systems Marital Status: Married Patient Roles: Spouse, Parent Spouse/Significant Other: Husband Children: Son Other Supports: Daughter in Arts development officer: son, son's wife and pt's spouse Ability/Limitations of Caregiver: son works, but his wife and pt's husband do not Careers adviser: 24/7  Social History Preferred language: Spanish Religion: Catholic Cultural Background: Hispanic   Abuse/Neglect Abuse/Neglect Assessment Can Be Completed: Yes Physical Abuse: Denies Verbal Abuse:  Denies Sexual Abuse: Denies Exploitation of patient/patient's resources: Denies Self-Neglect: Denies  Emotional Status Pt's affect, behavior and adjustment status: Normal mood, behavior  Patient / Family Perceptions, Expectations & Goals Pt/Family understanding of illness & functional limitations: Son appears to have a fair understanding of current health status and functional limitations Premorbid pt/family roles/activities: Independent prior to admission Anticipated changes in roles/activities/participation: Will require supervision and mod-max assist with cognition and comprehension Pt/family expectations/goals: Son reports he and his wife and the patient's spouse will provide any care needed at discharge however the son works. They would like for the patient to be as close to her pre-admission level as possible.  Occupational psychologist available at discharge: Son and daughter in law will provide transportation  Discharge Planning Living Arrangements: Spouse/significant other, Children Support Systems: Spouse/significant other, Children, Other relatives Type of Residence: Private residence Insurance Resources: Medicare(Medicaid is inactive; seeking assistance to re-activate) Financial Screen Referred: Yes Money Management: Spouse Does the patient have any problems obtaining your medications?: No Home Management: Daughter in law to manage the home Patient/Family Preliminary Plans: Discharge to son's home with spouse and daughter in law to assist when son is at work Sw Barriers to Discharge: Inaccessible home environment, Incontinence Sw Barriers to Discharge Comments: Mobile home with steps to entry Social Work Anticipated Follow Up Needs: HH/OP Expected length of stay: 7 days  Clinical Impression The patient is alert and pleasant, limited english. Spoke with the son to obtain information for assessment. He reports the family is "in shock", noting his mother was fine  one minute and the next she is like she is. They had no idea that she was at risk for having a stroke. Reports the patient and  her spouse come and go to [place] and were fairly independent before this. The son reports a good support group and the patient's spouse and the daughter in law are prepared to provide care for the patient at discharge. Requested assistance to follow up on an in-active status of the patient's medicaid and was referred to a Development worker, community.  Dorien Chihuahua B/k 07/28/2019, 4:30 AM

## 2019-07-28 NOTE — IPOC Note (Signed)
Overall Plan of Care [facility]) Patient Details Name: Jamie Mitchell MRN: [id number] DOB: September 09, 1945  Admitting Diagnosis: Left middle cerebral artery stroke [facility])  Hospital Problems: Principal Problem:   Left middle cerebral artery stroke [facility]) Active Problems:   Hypothyroidism   Hypertension   Atrial fibrillation with RVR ([place])   Prediabetes   Aphasia as late effect of cerebrovascular accident (CVA)   Dysphagia     Functional Problem List: Nursing Pain, Bladder  PT Balance, Behavior, Endurance, Motor, Safety  OT Cognition, Balance, Endurance, Pain, Safety  SLP Cognition, Linguistic, Motor, Nutrition, Safety  TR         Basic ADL's: OT Grooming, Bathing, Dressing, Toileting     Advanced  ADL's: OT Simple Meal Preparation, Laundry     Transfers: PT Bed Mobility, Bed to Chair, Car, [employer], Floor  OT Toilet, Metallurgist: PT Ambulation, Emergency planning/management officer, Stairs     Additional Impairments: OT None  SLP Swallowing, Communication, Social Cognition comprehension, expression Problem Solving, Awareness  TR      Anticipated Outcomes Item Anticipated Outcome  Self Feeding supervision  Swallowing  Min A   Basic self-care  supervision  Toileting  supervision   Bathroom Transfers supervision  Bowel/Bladder  to be continent x 2, LBM 03/02  Transfers  Mod i without AD  Locomotion  Ambulatory at mod I with LRAD  Communication  Max A  Cognition  Due to severity of aphasia and dysphagia and very short length of stay anticipated, cognitive goals will likely not be addressed as priority in ST  Pain  less than 3  Safety/Judgment      Therapy Plan: PT Intensity: Minimum of 1-2 x/day ,45 to 90 minutes PT Frequency: 5 out of 7 days PT Duration Estimated Length of Stay: 5-7 days OT Intensity: Minimum of 1-2 x/day, 45 to 90 minutes OT Frequency: 5 out of 7 days OT Duration/Estimated Length of Stay: 5-7 days SLP Intensity: Minumum of 1-2  x/day, 30 to 90 minutes SLP Frequency: 3 to 5 out of 7 days SLP Duration/Estimated Length of Stay: 7 days   Due to the current state of emergency, patients may not be receiving their 3-hours of Medicare-mandated therapy.   Team Interventions: Nursing Interventions Patient/Family Education, Disease Management/Prevention, Discharge Planning, Bladder Management  PT interventions Ambulation/gait training, Discharge planning, Cognitive remediation/compensation, DME/adaptive equipment instruction, Functional mobility training, Psychosocial support, Splinting/orthotics, Therapeutic Activities, UE/LE Strength taining/ROM, Visual/perceptual remediation/compensation, Training and development officer, Community reintegration, Disease management/prevention, Neuromuscular re-education, Patient/family education, Skin care/wound management, Stair training, Therapeutic Exercise, UE/LE Coordination activities, Wheelchair propulsion/positioning, Functional electrical stimulation, Pain management  OT Interventions Balance/vestibular training, Self Care/advanced ADL retraining, UE/LE Coordination activities, Functional mobility training, Visual/perceptual remediation/compensation, Cognitive remediation/compensation, Community reintegration, Neuromuscular re-education, Wheelchair propulsion/positioning, Discharge planning, Pain management, Therapeutic Activities, Disease mangement/prevention, Patient/family education, Therapeutic Exercise, Psychosocial support, UE/LE Strength taining/ROM, DME/adaptive equipment instruction  SLP Interventions Cognitive remediation/compensation, Cueing hierarchy, Internal/external aids, Functional tasks, Speech/Language facilitation, Dysphagia/aspiration precaution training, Patient/family education, Therapeutic Activities  TR Interventions    SW/CM Interventions Discharge Planning, Patient/Family Education, Disease Management/Prevention   Barriers to Discharge MD  Medical stability  Nursing  Incontinence has only been in [place] for approx 2 weeks prior to stroke. Son plans on having her home with him  PT Medical stability, Home environment access/layout expressive deficits  OT Other (comments) none known at this time  SLP      [place] home environment, Incontinence Mobile home with steps to entry   Team Discharge Planning: Destination:  PT-Home ,OT-   , SLP-Home Projected Follow-up: PT-Home health PT, OT-  24 hour supervision/assistance, Outpatient OT, SLP-Home Health SLP, 24 hour supervision/assistance Projected Equipment Needs: PT-To be determined, OT- To be determined, SLP-None recommended by SLP Equipment Details: PT- , OT-  Patient/family involved in discharge planning: PT- Patient, Family member/caregiver,  OT-Patient, Family member/caregiver, SLP-Patient  MD ELOS: 2 weeks Medical Rehab Prognosis:  Excellent Assessment: Jamie Mitchell has global aphasia and impaired mobility and continue to show improvements and motivation for therapy. Her son is very involved in her care and has received diabetic teaching. We have started Amantadine for her aphasia. She has left shoulder pain which we are treating with lidocaine patch and diclofenac gel. Labs have been ordered and reviewed. Her BP and HR continue to be well controlled. Her Cortrak was removed and she is tolerating this well.    See Team Conference Notes for weekly updates to the plan of care

## 2019-07-29 ENCOUNTER — Inpatient Hospital Stay (HOSPITAL_COMMUNITY): Payer: Medicare Other | Admitting: Physical Therapy

## 2019-07-29 ENCOUNTER — Inpatient Hospital Stay (HOSPITAL_COMMUNITY): Payer: Medicare Other | Admitting: Occupational Therapy

## 2019-07-29 ENCOUNTER — Inpatient Hospital Stay (HOSPITAL_COMMUNITY): Payer: Medicare Other | Admitting: Speech Pathology

## 2019-07-29 DIAGNOSIS — I69320 Aphasia following cerebral infarction: Secondary | ICD-10-CM

## 2019-07-29 DIAGNOSIS — I4891 Unspecified atrial fibrillation: Secondary | ICD-10-CM

## 2019-07-29 DIAGNOSIS — I483 Typical atrial flutter: Secondary | ICD-10-CM

## 2019-07-29 LAB — GLUCOSE, CAPILLARY
Glucose-Capillary: 113 mg/dL — ABNORMAL HIGH (ref 70–99)
Glucose-Capillary: 121 mg/dL — ABNORMAL HIGH (ref 70–99)
Glucose-Capillary: 122 mg/dL — ABNORMAL HIGH (ref 70–99)
Glucose-Capillary: 102 mg/dL — ABNORMAL HIGH (ref 70–99)

## 2019-07-29 LAB — MAGNESIUM: Magnesium: 2.1 mg/dL (ref 1.7–2.4)

## 2019-07-29 NOTE — Progress Notes (Signed)
Speech Language Pathology Daily Session Note  Patient Details  Name: Jamie Mitchell MRN: [id number] Date of Birth: 09/01/1945  Today's Date: 07/29/2019 SLP Individual Time: [id number] SLP Individual Time Calculation (min): 28 min  Short Term Goals: Week 1: SLP Short Term Goal 1 (Week 1): STG=LTG due to ELOS  Skilled Therapeutic Interventions:  Pt was seen for skilled ST targeting communication.  [name] and interpreter were present for the duration of today's therapy session.  [name] was very helpful within session and had begun compiling a list of words for pt to practice.  Pt was able to read from list to produce "mama," "pepe," "Ana," "no," and "papa" with mod-max assist multimodal cues to recognize and correct verbal errors.  Pt was able to produce /m/ and /a/ in isolation, in series of 5, when alternating between the two, and in a discrete word "mama."  She had difficulty producing novel vowel sounds in isolation but when paired with /m/ pt was able to produce "mi" as a discrete unit as well as paired with "ma" to make the word "mami."  SLP provided skilled education regarding apraxia and the concept of errorless learning with pt's son and encouraged him to continue compiling a list of words to target with pt that begin with sounds that pt can successfully produce.  All questions were answered to pt's son's satisfaction at this time.  Pt was left in recliner with son at bedside and chair alarm set.  Continue per current plan of care.    Pain Pain Assessment Pain Scale: 0-10 Pain Score: 0-No pain  Therapy/Group: Individual Therapy  Eldred Lievanos, Selinda Orion 07/29/2019, 12:42 PM

## 2019-07-29 NOTE — Progress Notes (Signed)
Occupational Therapy Session Note  Patient Details  Name: Jamie Mitchell MRN: [id number] Date of Birth: 18-Dec-1945  Today's Date: 07/29/2019 OT Individual Time: [phone] OT Individual Time Calculation (min): 72 min   Short Term Goals: Week 1:  OT Short Term Goal 1 (Week 1): STGs=LTGs secondary to short estimated LOS  Skilled Therapeutic Interventions/Progress Updates:    Pt greeted in bed, asleep, easily woken and agreeable to eat her breakfast. Min A for supine<sit and min guard for stand pivot<recliner. Pt ate her meal with min vcs for small bites and tactile cues to alternate liquids with solids for improved oral clearance. Pt then completed toileting (with continent bladder void), bathing (sitting on TTB in shower), dressing (sit<stand from standard toilet), and oral care + handwashing (standing at the sink). All functional transfers completed with min guard assist without device. She requires min visual and tactile cuing for sequencing during stated tasks, supervision for dynamic sitting on TTB and min guard when reaching to floor to retrieve sneakers and don them when sitting on toilet. She needed Min A to tie shoelaces. During dressing, OT placed her clothes on a bar beside the toilet to promote choice and self sequencing. After oral care and grooming task completion, pt engaged in IADL activity of bedmaking. Pt once again needing min visual and tactile cuing for sequencing, Min A also to problem solve the fitted sheet. Higher level balance challenges while reaching outside of base of support and standing without UE support to meet task demands. She sat to doff/don pillowcases to conserve energy. At end of session pt remained in the recliner with all needs within reach and chair alarm set.   Interpretor present during session   Therapy Documentation Precautions:  Precautions Precautions: Fall Restrictions Weight Bearing Restrictions: No Vital Signs: Therapy Vitals Temp: 98 F  (36.7 C) Temp Source: Oral Pulse Rate: 62 Resp: 20 BP: 102/69 Patient Position (if appropriate): Lying Oxygen Therapy SpO2: 94 % O2 Device: Room Air Pain: Pt symptomatic of pain at start of session, grimacing, holding Lt shoulder, and gesturing to shoulder when asked if she had pain. RN in to provide pain medicine during tx Pain Assessment Pain Scale: 0-10 Pain Score: 0-No pain ADL:       Therapy/Group: Individual Therapy  [name] [name] 07/29/2019, 4:10 PM

## 2019-07-29 NOTE — Progress Notes (Signed)
[place] PHYSICAL MEDICINE & REHABILITATION PROGRESS NOTE   Subjective/Complaints: Mrs. Koral has no complaints this morning. Denies pain, constipation. Sleeping well at night.  Son is at bedside and is concerned about aflutter. Discussed cardiology recommendations.   ROS: Limited due to aphasia.  Objective:   No results found. No results for input(s): WBC, HGB, HCT, PLT in the last 72 hours. Recent Labs    07/28/19 1939  NA 137  K 3.9  CL 105  CO2 21*  GLUCOSE 166*  BUN 23  CREATININE 1.05*  CALCIUM 8.5*    Intake/Output Summary (Last 24 hours) at 07/29/2019 1543 Last data filed at 07/29/2019 1300 Gross per 24 hour  Intake 640 ml  Output --  Net 640 ml     Physical Exam: Vital Signs Blood pressure 102/69, pulse 62, temperature 98 F (36.7 C), temperature source Oral, resp. rate 20, height 5' 2"  (1.575 m), weight 70.5 kg, SpO2 94 %.  Nursing note and vitals reviewed. Constitutional: She appears well-developed and well-nourished. Appears comfortable. Walked to bed from bathroom with CNA CGA with no AD; son at bedside; appropriate, no words but vocalizes some. NAD wearing her bifocals  HENT:  Head: Normocephalic and atraumatic.  Nose: Nose normal.  Mouth/Throat: Oropharynx is clear and moist.  Unable/couldn't stick tongue out- either didn't understand or wouldn't- giggled when asked 3x R facial droop noted Facial sensation decreased on R side  Eyes: Pupils are equal, round, and reactive to light. Conjunctivae and EOM are normal.  No nystagmus B/L  Neck: No tracheal deviation present.  Cardiovascular:  RRR, no M/R/G  Respiratory: No stridor.  CTA B/L- no W/R/R  GI:  Abdomen soft, NT, ND- (+) BS; LBM last night  Musculoskeletal:     Cervical back: Normal range of motion and neck supple.     Comments: Much improved ability to tolerate exam and comprehension of conversation.  LUE ~5/5 in deltoid, biceps, triceps, grip and finger abd RUE- attempted all muscles,  but only tested triceps 4/5, grip 4-/5 otherwise could not attempt with me LLE- 5/5 in HF, KE, KF, DF, and PF RLE- ~ 4/5 in HF and DF/PF   Neurological:  Patient is alert, sustains attention well. She has a left gaze preference and facial droop.  Appears globally aphasic.  Oriented x1 to self only.  She does follow some simple demonstrated commands. Ability to communicate continues to improve.  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   Skin: Skin is warm and dry.  Unable to check backside, but arms, legs and feet have no skin issues. Son reports no skin breakdown that's been reported  Psychiatric:  Smiling intermittently    Assessment/Plan: 1. Functional deficits secondary to L MCA infarct which require 3+ hours per day of interdisciplinary therapy in a comprehensive inpatient rehab setting.  Physiatrist is providing close team supervision and 24 hour management of active medical problems listed below.  Physiatrist and rehab team continue to assess barriers to discharge/monitor patient progress toward functional and medical goals  Care Tool:  Bathing  Bathing activity did not occur: Safety/medical concerns Body parts bathed by patient: Right arm, Left arm, Chest, Abdomen, Front perineal area, Buttocks, Right upper leg, Left upper leg, Right lower leg, Left lower leg,  Face         Bathing assist Assist Level: Supervision/Verbal cueing     Upper Body Dressing/Undressing Upper body dressing   What is the patient wearing?: Pull over shirt    Upper body assist Assist Level: Set up assist    Lower Body Dressing/Undressing Lower body dressing      What is the patient wearing?: Underwear/pull up, Pants     Lower body  assist Assist for lower body dressing: Contact Guard/Touching assist     Toileting Toileting Toileting Activity did not occur (Clothing management and hygiene only): Refused  Toileting assist Assist for toileting: Contact Guard/Touching assist     Transfers Chair/bed transfer  Transfers assist     Chair/bed transfer assist level: Supervision/Verbal cueing     Locomotion Ambulation   Ambulation assist      Assist level: Supervision/Verbal cueing Assistive device: No Device Max distance: 330   Walk 10 feet activity   Assist     Assist level: Supervision/Verbal cueing Assistive device: No Device   Walk 50 feet activity   Assist    Assist level: Supervision/Verbal cueing Assistive device: No Device    Walk 150 feet activity   Assist Walk 150 feet activity did not occur: Safety/medical concerns  Assist level: Supervision/Verbal cueing Assistive device: No Device    Walk 10 feet on uneven surface  activity   Assist     Assist level: Minimal Assistance - Patient > 75% Assistive device: Hand held assist   Wheelchair     Assist Will patient use wheelchair at discharge?: No Type of Wheelchair: Manual    Wheelchair assist level: Minimal Assistance - Patient > 75% Max wheelchair distance: 150    Wheelchair 50 feet with 2 turns activity    Assist        Assist Level: Minimal Assistance - Patient > 75%   Wheelchair 150 feet activity     Assist      Assist Level: Minimal Assistance - Patient > 75%   Blood pressure 102/69, pulse 62, temperature 98 F (36.7 C), temperature source Oral, resp. rate 20, height 5' 2"  (1.575 m), weight 70.5 kg, SpO2 94 %.   Medical Problem List and Plan: 1.  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 Vs Bromocriptine for apraxia.              -patient may  shower             -ELOS/Goals: ~ 2weeks; mod I except for speech  -3/4 started Amantadine 18m BID. D/ced 3/6 due to aflutter.  2.  Antithrombotics: -DVT/anticoagulation: SCDs- until OK'd by Neurology.              -antiplatelet therapy: Aspirin 81 mg daily 3. Pain Management: Tylenol as needed. Started Lidocaine patch for left shoulder.   3/4: Ordered diclofenac gel for left shoulder pain.   3/6: pain is well controlled 4. Mood: Advised emotional support             -antipsychotic agents: N/A 5. Neuropsych: This patient is not capable of making decisions on her own behalf due to aphasia. 6. Skin/Wound Care: Routine skin checks. May DC midline.  7. Fluids/Electrolytes/Nutrition: Routine in and outs with follow-up chemistries 8.  Atrial fibrillation and  flutter.  Amiodarone 200 mg twice daily x1 month then decrease to daily.PLAN TO START ELIQUIS 7-10 DAYS WITH RESOLUTION OF SAH.  3/6: tachycardic yesterday. EKG obtained and showed Aflutter. Cardiology consulted and recommended increasing amiodarone to 474m BID and cards will follow. Rate controlled this morning; appreciate cards follow-up.  9.  Permissive hypertension.  Monitor with increased mobility.  Patient on HCTZ 25 mg daily prior to admission.  3/5, 3/6: currently very well controlled. Does not require the HCTZ.  10.  Dysphagia.  Dysphagia #1 thin liquid diet.  Nasogastric tube currently in place for nutritional support -removed Cortrak this week and tolerating well.  11.  Hypothyroidism.  TSH level 6.781- 7.052.  Free T4 normal 0.80.  Synthroid 37.5 mg daily -suggest increase in dose since this was home dose.  12.  Hyperlipidemia.  Lipitor 13.  Prediabetes.  Hemoglobin A1c 6.1.  SSI. Will discuss further with son who has questions given first time on SSI.   3/4: Nursing has provided son with education regarding stroke, carb counting, and DASH diet.    3/5: Appreciate diabetes coordinator meeting with patient and her son and providing education. Patient will need glucometer kit upon discharge.  14. Insomnia: 3/4: Ordered Melatonin 3101mPRN HS.  LOS: 4 days A FACE TO FACE EVALUATION WAS PERFORMED  KrClide Deutscheraulkar 07/29/2019, 3:43 PM

## 2019-07-29 NOTE — Progress Notes (Signed)
Physical Therapy Session Note  Patient Details  Name: Jamie Mitchell MRN: [id number] Date of Birth: Apr 07, 1946  Today's Date: 07/29/2019 PT Individual Time: 1400-1515 PT Individual Time Calculation (min): 75 min   Short Term Goals: Week 1:  PT Short Term Goal 1 (Week 1): STG=LTG due to ELOS  Skilled Therapeutic Interventions/Progress Updates:   Pt received supine in bed and agreeable to PT. Supine>sit transfer without assist or cues. Pt says yes, when asked if she had any toileting needs. Able to void bladder, complete pericare and clothing management with distant supervision assist from PT.   Gait training throughout hall 235f, 1570f 1809fith supervision assist for safefty. Pt noted to have improved step symmetry today, but still maintains wide BOS; No LOB noted today with all gait training.   Nustep 4 min x 2, levl 3>4. Cues for proper pursed lip breathing and symmetry RvL intermittently throughout.  dynamic balance training on airx pad: lateral reach with bean bag  x 4 Bil and x 8 bil.  Standing on blue wedge while completing cognitive task of Peg board puzzle. Min cues for visaual scanning to match picture with low difficulty. Max cues and decreased selection pool to complete horizontal portion of cross, unable to complete vertical portion due to visual processing deficits. Supervision assist to maintain balance on all unstable surfaces with intermittent UE support.   Modified [place] balance program: forward/reverse gait. Side stepping, figuure 8, sit<>stand x 10, semitandem standing, stair negotiation x 8 with BUE support. Supervision assist and visual cues for technique throughout from PT to maximize neuromotor re-education   Pt returned to room and performed Ambulatory transfer to bed with supervision assist. Sit>supine completed without assist, and left supine in bed with call bell in reach and all needs met.        Therapy Documentation Precautions:   Precautions Precautions: Fall Restrictions Weight Bearing Restrictions: No   Pain: Pain Assessment Pain Scale: 0-10 Pain Score: 0-No pain    Therapy/Group: Individual Therapy  AusLorie Phenix6/2021, 3:23 PM

## 2019-07-29 NOTE — Progress Notes (Signed)
Sleeping comfortably, fully flat in bed. ECG confirms atrial flutter. Ventricular rate is well controlled. Asymptomatic. Decrease amiodarone dose at discharge, to 200 mg daily (currently anticipated for Wed-Thu of next week). Note plan to start Eliquis with resolution of SAH, in about another 4-5 days (which would coincide with anticipated DC). Need to see how she would gain access to this medication (she is visiting from [place]).

## 2019-07-30 LAB — GLUCOSE, CAPILLARY
Glucose-Capillary: 120 mg/dL — ABNORMAL HIGH (ref 70–99)
Glucose-Capillary: 117 mg/dL — ABNORMAL HIGH (ref 70–99)
Glucose-Capillary: 130 mg/dL — ABNORMAL HIGH (ref 70–99)
Glucose-Capillary: 105 mg/dL — ABNORMAL HIGH (ref 70–99)

## 2019-07-30 MED ORDER — APIXABAN 5 MG PO TABS
5.0000 mg | ORAL_TABLET | Freq: Two times a day (BID) | ORAL | Status: DC
  Administered 2019-07-30 – 2019-08-03 (×9): 5 mg via ORAL
  Filled 2019-07-30 (×9): qty 1

## 2019-07-30 NOTE — Discharge Instructions (Addendum)
Inpatient Rehab Discharge Instructions  [place] Discharge date and time: No discharge date for patient encounter.   Activities/Precautions/ Functional Status: Activity: activity as tolerated Diet:  Wound Care: none needed Functional status:  ___ No restrictions     ___ Walk up steps independently ___ 24/7 supervision/assistance   ___ Walk up steps with assistance ___ Intermittent supervision/assistance  ___ Bathe/dress independently ___ Walk with walker     _x__ Bathe/dress with assistance ___ Walk Independently    ___ Shower independently ___ Walk with assistance    ___ Shower with assistance ___ No alcohol     ___ Return to work/school ________  COMMUNITY REFERRALS UPON DISCHARGE:  Home Health: PT, OT,  ST  Agency:[facility] Phone:[phone]  Medical Equipment/Items Ordered:TTB  Agency/Supplier:[facility]  Special Instructions: No driving smoking or alcohol STROKE/TIA DISCHARGE INSTRUCTIONS SMOKING Cigarette smoking nearly doubles your risk of having a stroke & is the single most alterable risk factor  If you smoke or have smoked in the last 12 months, you are advised to quit smoking for your health.  Most of the excess cardiovascular risk related to smoking disappears within a year of stopping.  Ask you doctor about anti-smoking medications  [place] Quit Line: 1-800-QUIT NOW  Free Smoking Cessation Classes [phone]  CHOLESTEROL Know your levels; limit fat & cholesterol in your diet  Lipid Panel     Component Value Date/Time   CHOL 113 07/20/2019 0356   CHOL 137 02/26/2017 1035   TRIG 57 07/20/2019 0356   HDL 36 (L) 07/20/2019 0356   HDL 35 (L) 02/26/2017 1035   CHOLHDL 3.1 07/20/2019 0356   VLDL 11 07/20/2019 0356   LDLCALC 66 07/20/2019 0356   LDLCALC 49 02/26/2017 1035      Many patients benefit from treatment even if their cholesterol is at goal.  Goal: Total Cholesterol (CHOL) less than 160  Goal:  Triglycerides (TRIG)  less than 150  Goal:  HDL greater than 40  Goal:  LDL (LDLCALC) less than 100   BLOOD PRESSURE American Stroke Association blood pressure target is less that 120/80 mm/Hg  Your discharge blood pressure is:  BP: 112/71  Monitor your blood pressure  Limit your salt and alcohol intake  Many individuals will require more than one medication for high blood pressure  DIABETES (A1c is a blood sugar average for last 3 months) Goal HGBA1c is under 7% (HBGA1c is blood sugar average for last 3 months)  Diabetes:    Lab Results  Component Value Date   HGBA1C 6.1 (H) 07/20/2019   HGBA1C 6.0 (H) 07/20/2019     Your HGBA1c can be lowered with medications, healthy diet, and exercise.  Check your blood sugar as directed by your physician  Call your physician if you experience unexplained or low blood sugars.  PHYSICAL ACTIVITY/REHABILITATION Goal is 30 minutes at least 4 days per week  Activity: Increase activity slowly, Therapies: Physical Therapy: Home Health Return to work:   Activity decreases your risk of heart attack and stroke and makes your heart stronger.  It helps control your weight and blood pressure; helps you relax and can improve your mood.  Participate in a regular exercise program.  Talk with your doctor about the best form of exercise for you (dancing, walking, swimming, cycling).  DIET/WEIGHT Goal is to maintain a healthy weight  Your discharge diet is:  Diet Order            DIET - DYS 1 Room service appropriate?  Yes; Fluid consistency: Thin  Diet effective now              liquids Your height is:  Height: 5\' 2"  (157.5 cm) Your current weight is: Weight: 70.5 kg Your Body Mass Index (BMI) is:  BMI (Calculated): 28.42  Following the type of diet specifically designed for you will help prevent another stroke.  Your goal weight range is:    Your goal Body Mass Index (BMI) is 19-24.  Healthy food habits can help reduce 3 risk factors for stroke:  High  cholesterol, hypertension, and excess weight.  RESOURCES Stroke/Support Group:  Call [phone]   STROKE EDUCATION PROVIDED/REVIEWED AND GIVEN TO PATIENT Stroke warning signs and symptoms How to activate emergency medical system (call 911). Medications prescribed at discharge. Need for follow-up after discharge. Personal risk factors for stroke. Pneumonia vaccine given:  Flu vaccine given:  My questions have been answered, the writing is legible, and I understand these instructions.  I will adhere to these goals & educational materials that have been provided to me after my discharge from the hospital.      My questions have been answered and I understand these instructions. I will adhere to these goals and the provided educational materials after my discharge from the hospital.  Patient/Caregiver Signature _______________________________ Date __________  Clinician Signature _______________________________________ Date __________  Please bring this form and your medication list with you to all your follow-up doctor's appointments.     Apixaban oral tablets Qu es este medicamento? El APIXABAN es un anticoagulante (diluyente de la [place]). Se utiliza para reducir la posibilidad de derrame cerebral en los pacientes con una afeccin mdica llamada fibrilacin auricular. Se utiliza tambin para tratar o prevenir cogulos sanguneos en los pulmones o las venas. Este medicamento puede ser utilizado para otros usos; si tiene alguna pregunta consulte con su proveedor de atencin mdica o con su farmacutico. MARCAS COMUNES: Eliquis  Qu le debo informar a mi profesional de la salud antes de tomar este medicamento? Necesitan saber si usted presenta alguno de los siguientes problemas o situaciones: sndrome de anticuerpo antifosfolpido trastornos de sangrado hemorragia cerebral sangre en las heces (heces negras o de aspecto alquitranado) o si hay sangre en su vmito antecedentes de  cogulos sanguneos antecedentes de hemorragia estomacal enfermedad renal enfermedad heptica vlvula cardiaca mecnica una reaccin alrgica o inusual al apixabn, a otros medicamentos, alimentos, colorantes o conservantes si est embarazada o buscando quedar embarazada si est amamantando a un beb  Cmo debo utilizar este medicamento? Tome este medicamento por va oral con un vaso de agua. Siga las instrucciones de la etiqueta del [place]. Puede tomarlo con o sin alimentos. Si el Transport planner, tmelo con alimentos. Tome su medicamento a intervalos regulares. No lo tome con una frecuencia mayor a la indicada. No deje de tomarlo, excepto si as lo indica su mdico. Dejar de tomar este medicamento podra aumentar su riesgo de tener un cogulo sanguneo. Asegrese de volver a surtir su receta antes de quedarse sin medicamento. Hable con su pediatra para informarse acerca del uso de este medicamento en nios. Puede requerir atencin especial. Sobredosis: Pngase en contacto inmediatamente con un centro toxicolgico o una sala de urgencia si usted cree que haya tomado demasiado medicamento. ATENCIN: [employer] es solo para usted. No comparta este medicamento con nadie.  Qu sucede si me olvido de una dosis? Si se olvida una dosis, tmela lo antes posible. Si es casi la hora de la prxima dosis, tome  slo esa dosis. No tome dosis adicionales o dobles.  Qu puede interactuar con este medicamento? Esta medicina puede interactuar con los siguientes medicamentos: aspirina o medicamentos tipo aspirina ciertos medicamentos para infecciones micticas tales como quetoconazol e itraconazol ciertos medicamentos para convulsiones tales como carbamazepina y fenitona ciertos medicamentos que tratan o previenen cogulos sanguneos, como warfarina, enoxaparina y dalteparina claritromicina los [place], medicamentos para el dolor o inflamacin, como ibuprofeno o naproxeno rifampicina  ritonavir hierba de [place] Puede ser que esta lista no menciona todas las posibles interacciones. Informe a su profesional de [employer]	[place] de [employer] productos a base de hierbas, medicamentos de [name] o suplementos nutritivos que est tomando. Si usted fuma, consume bebidas alcohlicas o si utiliza drogas ilegales, indqueselo tambin a su profesional de [employer]	[place]. Algunas sustancias pueden interactuar con su medicamento.  A qu debo estar atento al usar Coca[employer]a? Visite a su profesional de la salud para que revise regularmente su evolucin. Usted podra necesitar realizarse [employer] de sangre mientras est usando [place]. Se supervisar su estado de salud atentamente mientras reciba este medicamento. Es importante no faltar a ninguna cita. Evite los deportes y actividades que podran causar una lesin mientras est usando este medicamento. Las lesiones o cadas graves pueden causar un sangrado oculto. Tenga cuidado al usar herramientas filosas o cuchillos. Considere usar Forensic scientist. Tenga especial cuidado al [employer] o usar hilo dental. Informe cualquier lesin, hematoma, o puntos rojos en la piel a su profesional de [employer]	[place]. Si va a someterse a una operacin o a otro procedimiento, informe a su profesional de la salud que est tomando [employer]a. Use un brazalete o una cadena de identificacin mdica. Lleve con usted una tarjeta que describa su enfermedad, detalles de su medicamento y horario de dosis.  Qu efectos secundarios puedo tener al [employer] este medicamento? Efectos secundarios que debe informar a su mdico o a Barrister's clerk de la salud tan pronto como sea posible: Chief of Staff, como erupcin cutnea, comezn/picazn o urticaria, e hinchazn de la cara, los labios o la lengua signos y sntomas de Teacher, music, tales como heces con sangre o de color negro y aspecto alquitranado; [place] de color rojo o marrn oscuro; escupir sangre o  material marrn que tiene el aspecto de granos de caf molido; Tree surgeon rojas en la piel; sangrado o moretones inusuales en los ojos, las encas o la nariz signos y sntomas de un cogulo sanguneo, tales como dolor en el pecho; falta de aire; dolor, hinchazn o calor en la pierna signos y sntomas de un accidente cerebrovascular, tales como cambios en la visin; confusin; dificultad para hablar o entender; dolores de cabeza severos; entumecimiento o debilidad repentina de la cara, el brazo o la pierna; problemas al caminar; Chief of Staff; prdida de la coordinacin Puede ser que esta lista no menciona todos los posibles efectos secundarios. Comunquese a su mdico por asesoramiento mdico [employer] Usted puede informar los efectos secundarios a la FDA por telfono al 1-800-FDA-1088.  Dnde debo guardar mi medicina? Mantngala fuera del alcance de los nios. [place] a [employer], entre 20 y 39 grados C (29 y 6 grados F). Deseche todo el medicamento que no haya utilizado, despus de la fecha de vencimiento. ATENCIN: Este folleto es un resumen. Puede ser que no cubra toda la posible informacin. Si usted tiene preguntas acerca de esta medicina, consulte con su mdico, su farmacutico o su profesional de Technical sales engineer.

## 2019-07-30 NOTE — Progress Notes (Signed)
Progress Note  Patient Name: Jamie Mitchell Date of Encounter: 07/30/2019  Primary Cardiologist: Donato Heinz, MD   Subjective   Back in NSR. No palpitations, no other symptoms with rhythm change.  Inpatient Medications    Scheduled Meds: . amiodarone  400 mg Oral BID  . aspirin EC  81 mg Oral Daily  . atorvastatin  40 mg Oral Daily  . feeding supplement (ENSURE ENLIVE)  237 mL Oral TID BM  . insulin aspart  0-9 Units Subcutaneous TID WC & HS  . levothyroxine  37.5 mcg Oral Q0600  . lidocaine  1 patch Transdermal Q24H  . living well with diabetes book- in spanish   Does not apply Once   Continuous Infusions:  PRN Meds: acetaminophen **OR** acetaminophen (TYLENOL) oral liquid 160 mg/5 mL **OR** acetaminophen, diclofenac Sodium, Melatonin, ondansetron **OR** ondansetron (ZOFRAN) IV, senna-docusate   Vital Signs    Vitals:   07/29/19 0501 07/29/19 1529 07/29/19 1921 07/30/19 0348  BP: (!) 132/93 102/69 120/89 117/76  Pulse: 74 62 (!) 58 62  Resp: 18 20    Temp: (!) 97.5 F (36.4 C) 98 F (36.7 C) 98.4 F (36.9 C) 98.3 F (36.8 C)  TempSrc: Oral Oral Oral Oral  SpO2: 94% 94% 94% 95%  Weight:      Height:        Intake/Output Summary (Last 24 hours) at 07/30/2019 1118 Last data filed at 07/30/2019 0829 Gross per 24 hour  Intake 760 ml  Output --  Net 760 ml   Last 3 Weights 07/25/2019 07/23/2019 07/22/2019  Weight (lbs) 155 lb 6.8 oz 156 lb 8.4 oz 157 lb 10.1 oz  Weight (kg) 70.5 kg 71 kg 71.5 kg      Telemetry    n/a - Personally Reviewed  ECG    NSR 60 bpm, PRWP - Personally Reviewed  Physical Exam  Smiling, looks comfortable lying flat GEN: No acute distress.   Neck: No JVD Cardiac: RRR, no murmurs, rubs, or gallops.  Respiratory: Clear to auscultation bilaterally. GI: Soft, nontender, non-distended  MS: No edema; No deformity. Neuro:  dense expressive aphasia; appears to comprehend (Spanish speaking only) Psych: Normal affect    Labs    High Sensitivity Troponin:  No results for input(s): TROPONINIHS in the last 720 hours.    Chemistry Recent Labs  Lab 07/25/19 0806 07/26/19 0831 07/28/19 1939  NA 140 138 137  K 3.9 4.0 3.9  CL 105 103 105  CO2 24 21* 21*  GLUCOSE 122* 163* 166*  BUN 23 22 23   CREATININE 0.80 0.90 1.05*  CALCIUM 8.6* 8.7* 8.5*  PROT  --  7.0  --   ALBUMIN  --  3.3*  --   AST  --  35  --   ALT  --  38  --   ALKPHOS  --  85  --   BILITOT  --  0.8  --   GFRNONAA >60 >60 53*  GFRAA >60 >60 >60  ANIONGAP 11 14 11      Hematology Recent Labs  Lab 07/24/19 0529 07/25/19 0806 07/26/19 0831  WBC 6.1 6.8 7.3  RBC 4.09 4.57 5.02  HGB 12.1 13.4 14.8  HCT 37.4 40.6 44.6  MCV 91.4 88.8 88.8  MCH 29.6 29.3 29.5  MCHC 32.4 33.0 33.2  RDW 13.5 13.2 13.4  PLT 192 230 226    BNPNo results for input(s): BNP, PROBNP in the last 168 hours.   DDimer No results for input(s):  DDIMER in the last 168 hours.   Radiology    No results found.  Cardiac Studies  ECHO 06/30/2019  1. Left ventricular ejection fraction, by estimation, is 60 to 65%. The  left ventricle has normal function. The left ventricle has no regional  wall motion abnormalities. Left ventricular diastolic parameters were  normal.  2. Right ventricular systolic function is normal. The right ventricular  size is normal. Tricuspid regurgitation signal is inadequate for assessing  PA pressure.  3. The mitral valve is normal in structure and function. Mild mitral  valve regurgitation. No evidence of mitral stenosis.  4. The aortic valve is normal in structure and function. Aortic valve  regurgitation is not visualized. No aortic stenosis is present.  5. The inferior vena cava is normal in size with greater than 50%  respiratory variability, suggesting right atrial pressure of 3 mmHg.   Patient Profile     74 y.o. female with embolic stroke (newly diagnosed atrial flutter), s/p L MCA thrombectomy complicated by  moderate SAH.  Assessment & Plan    Recurrent asymptomatic atrial flutter with RVR, rate controlled after an increase in amiodarone dose, then converted to NSR. No structural heart disease identified. Start Eliquis 5 mg BID and reduce amiodarone to 200 mg daily at DC. She plans to stay in the [place]. She has been seen at the [facility] and Wellness clinic and should be able to get Eliquis there or apply for pharmaceutical company assistance. She had Medicaid, but reportedly lost it due to the stimulus checks in her bank account. Should get a 30-day free Eliquis card at DC.     For questions or updates, please contact [place] Please consult [email or web address] for contact info under        Signed, Sanda Klein, MD  07/30/2019, 11:18 AM

## 2019-07-30 NOTE — Progress Notes (Signed)
[place] PHYSICAL MEDICINE & REHABILITATION PROGRESS NOTE   Subjective/Complaints: [name] has no complaints this morning. Denies pain, constipation. Sleeping well at night.  Back in NSR; appreciate cardiology input. She had a nice visit with son yesterday.   ROS: Limited due to aphasia.  Objective:   No results found. No results for input(s): WBC, HGB, HCT, PLT in the last 72 hours. Recent Labs    07/28/19 1939  NA 137  K 3.9  CL 105  CO2 21*  GLUCOSE 166*  BUN 23  CREATININE 1.05*  CALCIUM 8.5*    Intake/Output Summary (Last 24 hours) at 07/30/2019 1237 Last data filed at 07/30/2019 1136 Gross per 24 hour  Intake 1770 ml  Output --  Net 1770 ml     Physical Exam: Vital Signs Blood pressure 117/76, pulse 62, temperature 98.3 F (36.8 C), temperature source Oral, resp. rate 20, height 5' 2"  (1.575 m), weight 70.5 kg, SpO2 95 %.  Nursing note and vitals reviewed. Constitutional: She appears well-developed and well-nourished. Appears comfortable. Walked to bed from bathroom with CNA CGA with no AD; son at bedside; appropriate, no words but vocalizes some. NAD wearing her bifocals  HENT:  Head: Normocephalic and atraumatic.  Nose: Nose normal.  Mouth/Throat: Oropharynx is clear and moist.  Unable/couldn't stick tongue out- either didn't understand or wouldn't- giggled when asked 3x R facial droop noted Facial sensation decreased on R side  Eyes: Pupils are equal, round, and reactive to light. Conjunctivae and EOM are normal.  No nystagmus B/L  Neck: No tracheal deviation present.  Cardiovascular:  RRR, no M/R/G  Respiratory: No stridor.  CTA B/L- no W/R/R  GI:  Abdomen soft, NT, ND- (+) BS; LBM last night  Musculoskeletal:     Cervical back: Normal range of motion and neck supple.     Comments: Much improved ability to tolerate exam and comprehension of conversation, still with difficulty following commands.  LUE ~5/5 in deltoid, biceps, triceps, grip and  finger abd RUE- attempted all muscles, but only tested triceps 4/5, grip 4-/5 otherwise could not attempt with me LLE- 5/5 in HF, KE, KF, DF, and PF RLE- ~ 4/5 in HF and DF/PF   Neurological:  Patient is alert, sustains attention well. She has a left gaze preference and facial droop.  Appears globally aphasic.  Oriented x1 to self only.  She does follow some simple demonstrated commands. Ability to communicate continues to improve.  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   Skin: Skin is warm and dry.  Unable to check backside, but arms, legs and feet have no skin issues. Son reports no skin breakdown that's been reported  Psychiatric:  Smiling intermittently    Assessment/Plan: 1. Functional deficits secondary to L MCA infarct which require 3+ hours per day of interdisciplinary therapy in a comprehensive inpatient rehab setting.  Physiatrist is providing close team supervision and 24 hour management of active medical problems listed below.  Physiatrist and rehab team continue to assess barriers to discharge/monitor patient progress toward functional and medical goals  Care Tool:  Bathing  Bathing activity did not occur: Safety/medical concerns Body parts bathed by patient: Right arm, Left arm, Chest, Abdomen, Front perineal area, Buttocks, Right upper leg, Left upper  leg, Right lower leg, Left lower leg, Face         Bathing assist Assist Level: Supervision/Verbal cueing     Upper Body Dressing/Undressing Upper body dressing   What is the patient wearing?: Pull over shirt    Upper body assist Assist Level: Set up assist    Lower Body Dressing/Undressing Lower body dressing      What is the patient wearing?:  Underwear/pull up, Pants     Lower body assist Assist for lower body dressing: Contact Guard/Touching assist     Toileting Toileting Toileting Activity did not occur (Clothing management and hygiene only): Refused  Toileting assist Assist for toileting: Contact Guard/Touching assist     Transfers Chair/bed transfer  Transfers assist     Chair/bed transfer assist level: Supervision/Verbal cueing     Locomotion Ambulation   Ambulation assist      Assist level: Supervision/Verbal cueing Assistive device: No Device Max distance: 330   Walk 10 feet activity   Assist     Assist level: Supervision/Verbal cueing Assistive device: No Device   Walk 50 feet activity   Assist    Assist level: Supervision/Verbal cueing Assistive device: No Device    Walk 150 feet activity   Assist Walk 150 feet activity did not occur: Safety/medical concerns  Assist level: Supervision/Verbal cueing Assistive device: No Device    Walk 10 feet on uneven surface  activity   Assist     Assist level: Minimal Assistance - Patient > 75% Assistive device: Hand held assist   Wheelchair     Assist Will patient use wheelchair at discharge?: No Type of Wheelchair: Manual    Wheelchair assist level: Minimal Assistance - Patient > 75% Max wheelchair distance: 150    Wheelchair 50 feet with 2 turns activity    Assist        Assist Level: Minimal Assistance - Patient > 75%   Wheelchair 150 feet activity     Assist      Assist Level: Minimal Assistance - Patient > 75%   Blood pressure 117/76, pulse 62, temperature 98.3 F (36.8 C), temperature source Oral, resp. rate 20, height 5' 2"  (1.575 m), weight 70.5 kg, SpO2 95 %.   Medical Problem List and Plan: 1.  Right-sided weakness with aphasia/dysphagia secondary to left MCA infarct due to left M2 occlusion status post TPA and IR revascularization with focal SAH, infarct embolic secondary newly diagnosed  atrial fibrillation             -suggest Amantadine for aphasia. Vs Bromocriptine for apraxia.              -patient may  shower             -ELOS/Goals: ~ 2weeks; mod I except for speech  -3/4 started Amantadine 169m BID. D/ced 3/6 due to aflutter.  2.  Antithrombotics: -DVT/anticoagulation: SCDs- until OK'd by Neurology.              -antiplatelet therapy: Aspirin 81 mg daily 3. Pain Management: Tylenol as needed. Started Lidocaine patch for left shoulder.   3/4: Ordered diclofenac gel for left shoulder pain.   3/6: pain is well controlled 4. Mood: Advised emotional support             -antipsychotic agents: N/A 5. Neuropsych: This patient is not capable of making decisions on her own behalf due to aphasia. 6. Skin/Wound Care: Routine skin checks. May DC midline.  7. Fluids/Electrolytes/Nutrition: Routine in and outs with  follow-up chemistries 8.  Atrial fibrillation and flutter.  Amiodarone 200 mg twice daily x1 month then decrease to daily.PLAN TO START ELIQUIS 7-10 DAYS WITH RESOLUTION OF SAH.  3/6: tachycardic yesterday. EKG obtained and showed Aflutter. Cardiology consulted and recommended increasing amiodarone to 429m BID and cards will follow. Rate controlled this morning; appreciate cards follow-up.  3/7: back to NSR, appreciate cardiology input.  Reduce amiodarone to 2074mdaily at DC. Eliquis 87m54mID restarted.  9.  Permissive hypertension.  Monitor with increased mobility.  Patient on HCTZ 25 mg daily prior to admission.  3/5, 3/6, 3/7: currently very well controlled. Does not require the HCTZ.  10.  Dysphagia.  Dysphagia #1 thin liquid diet.  Nasogastric tube currently in place for nutritional support -removed Cortrak this week and tolerating well.  11.  Hypothyroidism.  TSH level 6.781- 7.052.  Free T4 normal 0.80.  Synthroid 37.5 mg daily -suggest increase in dose since this was home dose.  12.  Hyperlipidemia.  Lipitor 13.  Prediabetes.  Hemoglobin A1c 6.1.  SSI. Will  discuss further with son who has questions given first time on SSI   3/4: Nursing has provided son with education regarding stroke, carb counting, and DASH diet.   3/5: Appreciate diabetes coordinator meeting with patient and her son and providing education. Patient will need glucometer kit upon discharge.   3/7: well controlled.  14. Insomnia: 3/4: Ordered Melatonin 3mg26mN HS.  3/7: sleeping well.  LOS: 5 days A FACE TO FACE EVALUATION WAS PERFORMED  KrutIzora Ribas/2021, 12:37 PM

## 2019-07-31 ENCOUNTER — Inpatient Hospital Stay (HOSPITAL_COMMUNITY): Payer: Medicare Other | Admitting: Occupational Therapy

## 2019-07-31 ENCOUNTER — Inpatient Hospital Stay (HOSPITAL_COMMUNITY): Payer: Medicare Other

## 2019-07-31 ENCOUNTER — Inpatient Hospital Stay (HOSPITAL_COMMUNITY): Payer: Medicare Other | Admitting: Physical Therapy

## 2019-07-31 ENCOUNTER — Inpatient Hospital Stay (HOSPITAL_COMMUNITY): Payer: Medicare Other | Admitting: Speech Pathology

## 2019-07-31 LAB — GLUCOSE, CAPILLARY
Glucose-Capillary: 116 mg/dL — ABNORMAL HIGH (ref 70–99)
Glucose-Capillary: 102 mg/dL — ABNORMAL HIGH (ref 70–99)
Glucose-Capillary: 153 mg/dL — ABNORMAL HIGH (ref 70–99)
Glucose-Capillary: 90 mg/dL (ref 70–99)

## 2019-07-31 IMAGING — CT CT HEAD W/O CM
4 series · 16 of 47 positions shown, 18 images · non-contrast
Comparison: [DATE]

CLINICAL DATA: Stroke, follow-up

EXAM:
CT HEAD WITHOUT CONTRAST
TECHNIQUE: Contiguous axial images were obtained from the base of the skull
through the vertex without intravenous contrast.

[Series 3: head wo · axial · 0.39mm/px · z∈[-119,-9]mm · 7 of 30 slices shown, 9 images]
[im 4/30  brain]
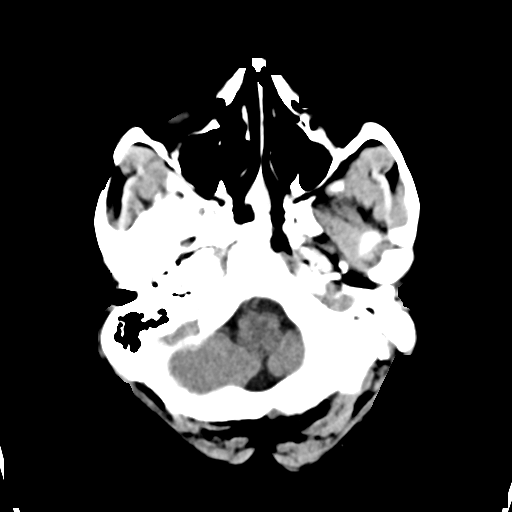
[im 4/30  bone]
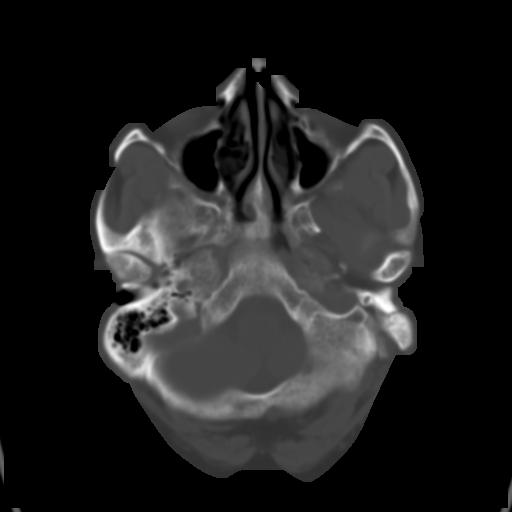
[im 8/30  brain]
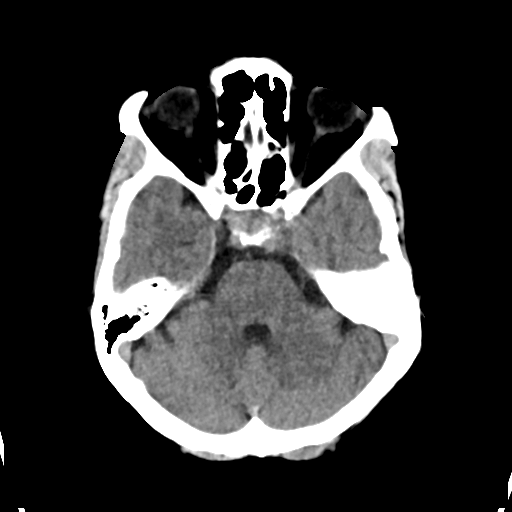
[im 11/30  brain]
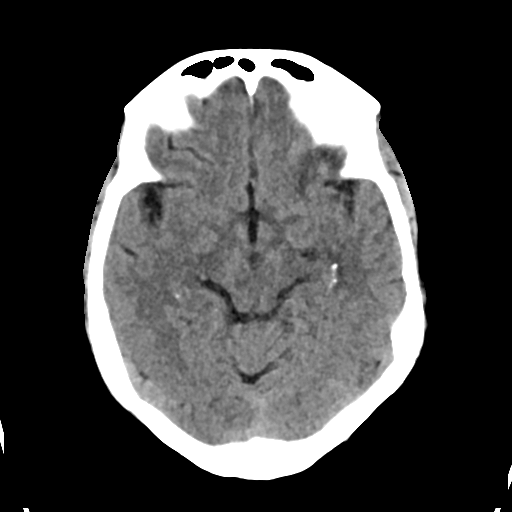
[im 15/30  brain]
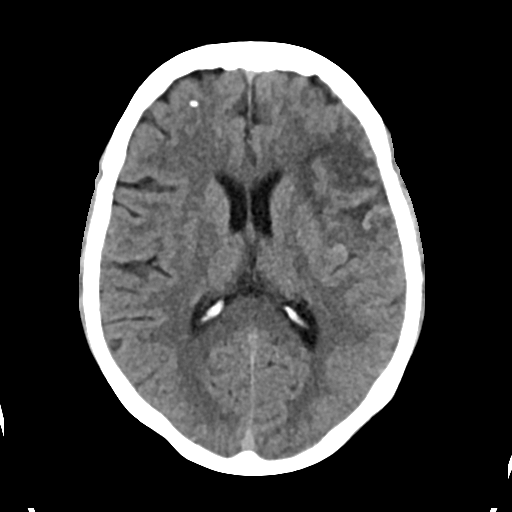
[im 19/30  brain]
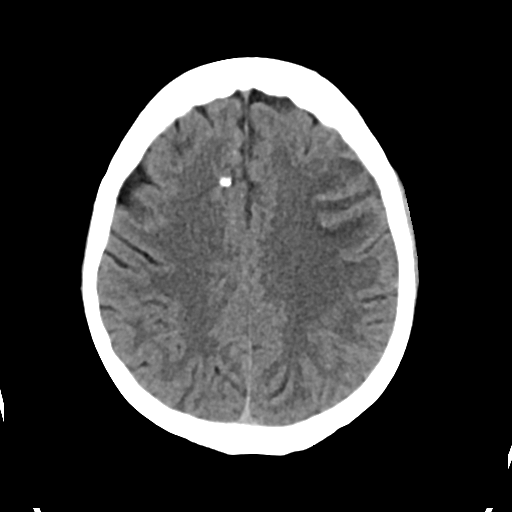
[im 19/30  bone]
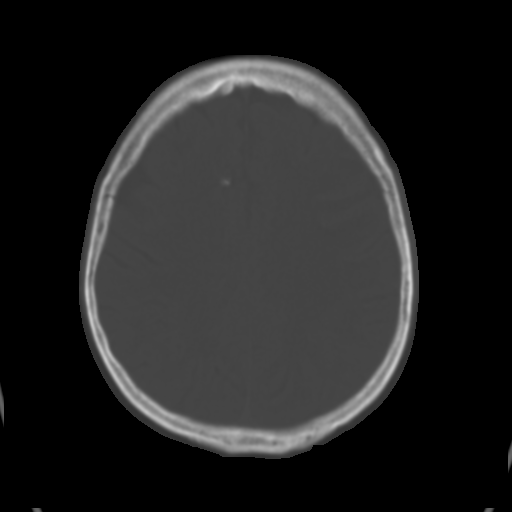
[im 22/30  brain]
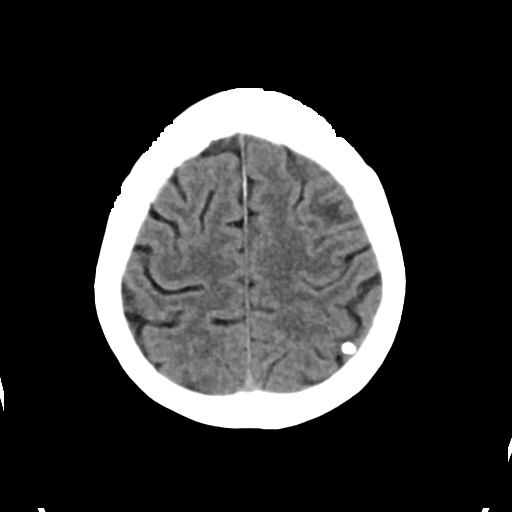
[im 26/30  brain]
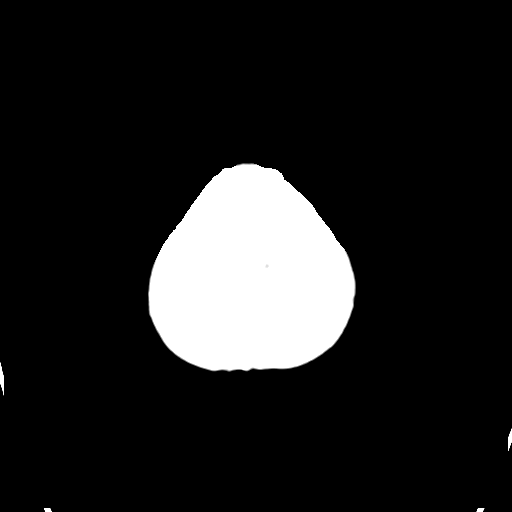

[Series 4: head bone · axial · 0.39mm/px · z∈[-120,-92]mm · 3 of 74 slices shown]
[im 8/74  bone]
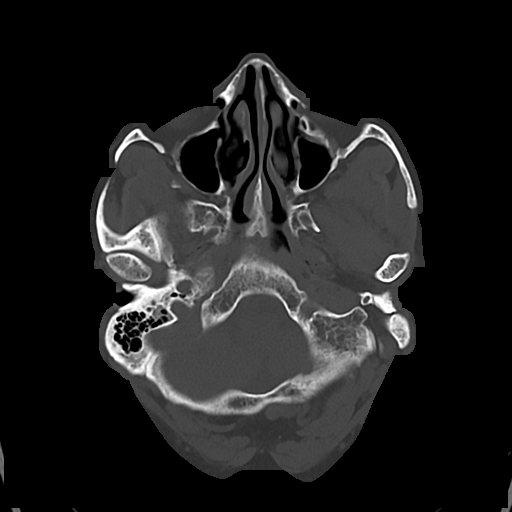
[im 15/74  bone]
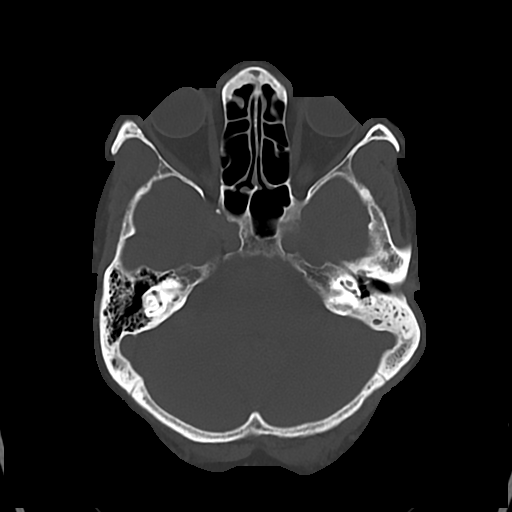
[im 22/74  bone]
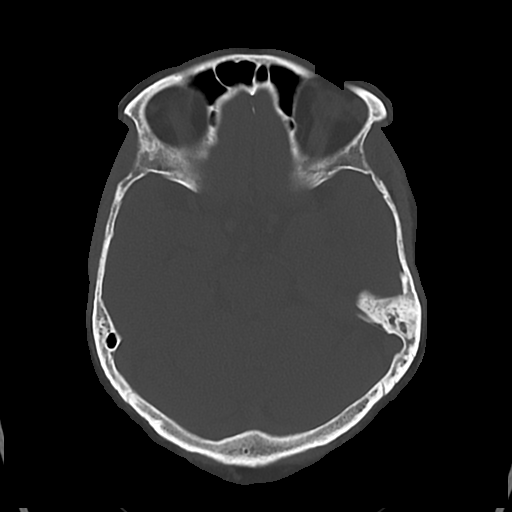

[Series 5: cor soft · coronal · 0.29mm/px · 3 of 60 slices shown]
[im 20/60  brain]
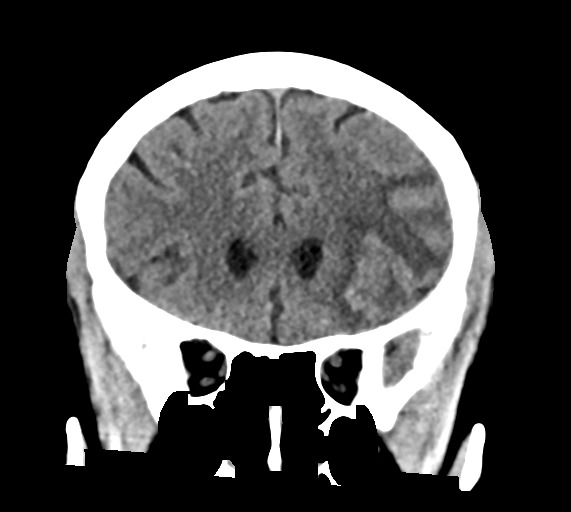
[im 27/60  brain]
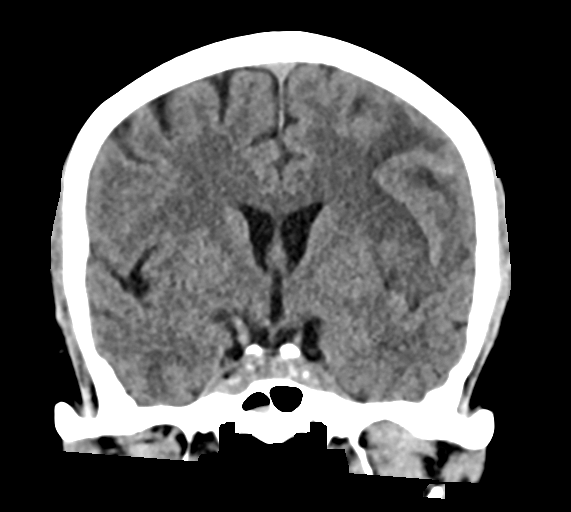
[im 33/60  brain]
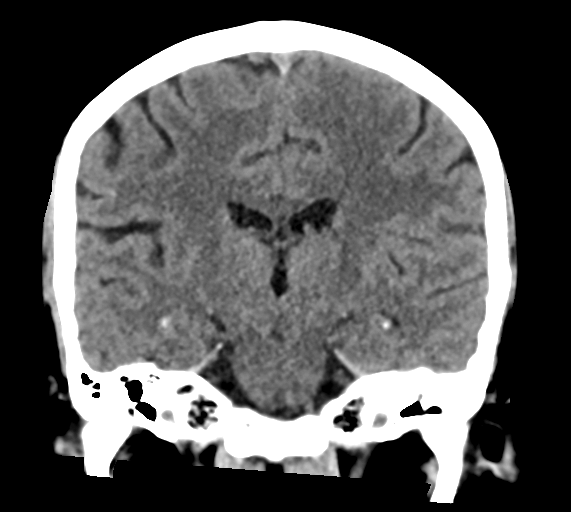

[Series 6: sag soft · sagittal · 0.29mm/px · 3 of 55 slices shown]
[im 19/55  brain]
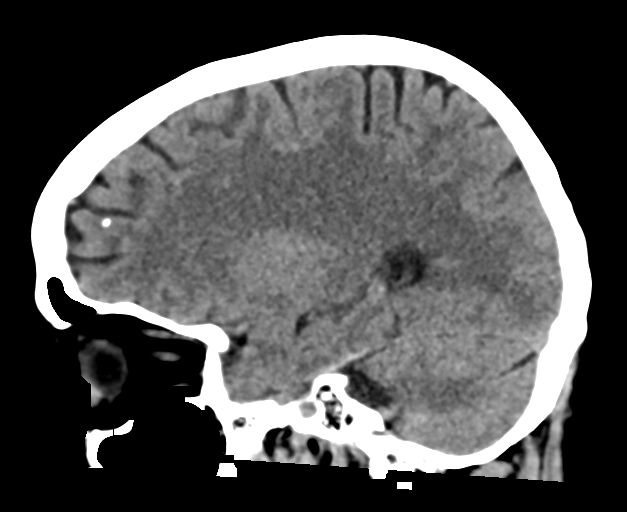
[im 28/55  brain]
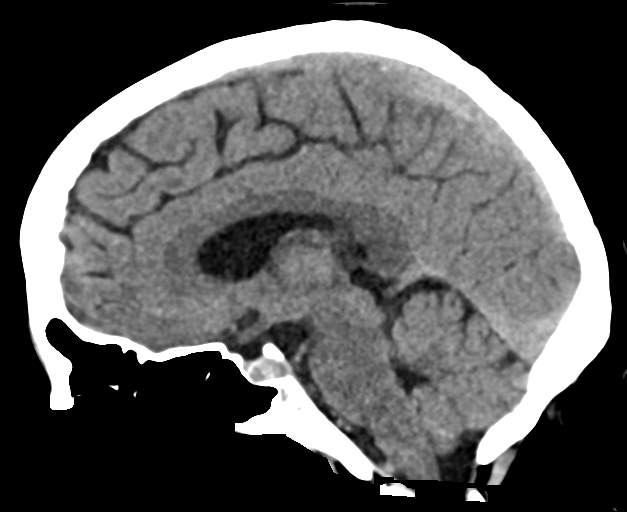
[im 37/55  brain]
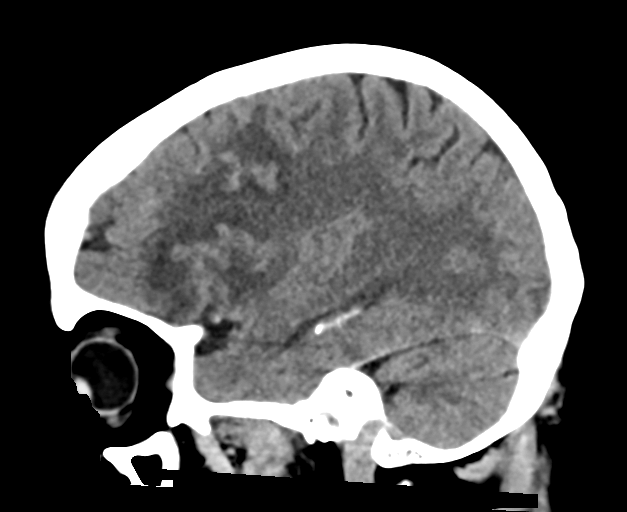

[16 of 47 positions shown; findings below may reference images not displayed]

FINDINGS: Brain: Left frontal infarction is again identified involving the
middle and inferior frontal gyri. There is increased gyriform
density likely reflecting petechial hemorrhage. Associated edema has
decreased. Expected evolution of hemorrhage on the prior studies.

There is no loss gray-white differentiation.  No hydrocephalus.

Vascular: No new finding.

Skull: No new finding.

Sinuses/Orbits: Mild mucosal thickening.  No acute orbital finding.

Other: Persistent left mastoid and middle ear opacification.
IMPRESSION: Evolving left frontal infarction with decreased edema. There is
probable petechial hemorrhage without discrete hematoma. No
significant mass effect.

## 2019-07-31 NOTE — Progress Notes (Signed)
Progress Note  Patient Name: Jamie Mitchell Date of Encounter: 07/31/2019  Primary Cardiologist: Donato Heinz, MD   Subjective   The patient remains in [place] - on ECG this am, no telemetry.  Inpatient Medications    Scheduled Meds: . amiodarone  400 mg Oral BID  . apixaban  5 mg Oral BID  . aspirin EC  81 mg Oral Daily  . atorvastatin  40 mg Oral Daily  . feeding supplement (ENSURE ENLIVE)  237 mL Oral TID BM  . insulin aspart  0-9 Units Subcutaneous TID WC & HS  . levothyroxine  37.5 mcg Oral Q0600  . lidocaine  1 patch Transdermal Q24H  . living well with diabetes book- in spanish   Does not apply Once   Continuous Infusions:  PRN Meds: acetaminophen **OR** acetaminophen (TYLENOL) oral liquid 160 mg/5 mL **OR** acetaminophen, diclofenac Sodium, Melatonin, ondansetron **OR** ondansetron (ZOFRAN) IV, senna-docusate   Vital Signs    Vitals:   07/30/19 0348 07/30/19 1309 07/30/19 1936 07/31/19 0402  BP: 117/76 108/75 120/74 116/66  Pulse: 62 (!) 57 65 (!) 57  Resp:  17 16 16   Temp: 98.3 F (36.8 C) 98.2 F (36.8 C) 97.9 F (36.6 C) (!) 97.5 F (36.4 C)  TempSrc: Oral  Oral Oral  SpO2: 95% 98% 96% 99%  Weight:      Height:        Intake/Output Summary (Last 24 hours) at 07/31/2019 0925 Last data filed at 07/31/2019 0817 Gross per 24 hour  Intake 1757 ml  Output --  Net 1757 ml   Last 3 Weights 07/25/2019 07/23/2019 07/22/2019  Weight (lbs) 155 lb 6.8 oz 156 lb 8.4 oz 157 lb 10.1 oz  Weight (kg) 70.5 kg 71 kg 71.5 kg      Telemetry    n/a - Personally Reviewed  ECG    NSR 60 bpm, PRWP - Personally Reviewed  Physical Exam  Smiling, looks comfortable lying flat GEN: No acute distress.   Neck: No JVD Cardiac: RRR, no murmurs, rubs, or gallops.  Respiratory: Clear to auscultation bilaterally. GI: Soft, nontender, non-distended  MS: No edema; No deformity. Neuro:  dense expressive aphasia; appears to comprehend (Spanish speaking  only) Psych: Normal affect   Labs    High Sensitivity Troponin:  No results for input(s): TROPONINIHS in the last 720 hours.    Chemistry Recent Labs  Lab 07/25/19 0806 07/26/19 0831 07/28/19 1939  NA 140 138 137  K 3.9 4.0 3.9  CL 105 103 105  CO2 24 21* 21*  GLUCOSE 122* 163* 166*  BUN 23 22 23   CREATININE 0.80 0.90 1.05*  CALCIUM 8.6* 8.7* 8.5*  PROT  --  7.0  --   ALBUMIN  --  3.3*  --   AST  --  35  --   ALT  --  38  --   ALKPHOS  --  85  --   BILITOT  --  0.8  --   GFRNONAA >60 >60 53*  GFRAA >60 >60 >60  ANIONGAP 11 14 11      Hematology Recent Labs  Lab 07/25/19 0806 07/26/19 0831  WBC 6.8 7.3  RBC 4.57 5.02  HGB 13.4 14.8  HCT 40.6 44.6  MCV 88.8 88.8  MCH 29.3 29.5  MCHC 33.0 33.2  RDW 13.2 13.4  PLT 230 226    BNPNo results for input(s): BNP, PROBNP in the last 168 hours.   DDimer No results for input(s): DDIMER in  the last 168 hours.   Radiology    No results found.  Cardiac Studies  ECHO 06/30/2019  1. Left ventricular ejection fraction, by estimation, is 60 to 65%. The  left ventricle has normal function. The left ventricle has no regional  wall motion abnormalities. Left ventricular diastolic parameters were  normal.  2. Right ventricular systolic function is normal. The right ventricular  size is normal. Tricuspid regurgitation signal is inadequate for assessing  PA pressure.  3. The mitral valve is normal in structure and function. Mild mitral  valve regurgitation. No evidence of mitral stenosis.  4. The aortic valve is normal in structure and function. Aortic valve  regurgitation is not visualized. No aortic stenosis is present.  5. The inferior vena cava is normal in size with greater than 50%  respiratory variability, suggesting right atrial pressure of 3 mmHg.   Patient Profile     74 y.o. female with embolic stroke (newly diagnosed atrial flutter), s/p L MCA thrombectomy complicated by moderate SAH.  Assessment &  Plan    Recurrent asymptomatic atrial flutter with RVR, rate controlled after an increase in amiodarone dose, then converted to NSR. No structural heart disease identified. Start Eliquis 5 mg BID and reduce amiodarone to 200 mg daily at DC. She plans to stay in the [place]. She has been seen at the [facility] and Wellness clinic and should be able to get Eliquis there or apply for pharmaceutical company assistance. She had Medicaid, but reportedly lost it due to the stimulus checks in her bank account. Should get a 30-day free Eliquis card at DC.    For questions or updates, please contact [place] Please consult [email or web address] for contact info under     Signed, Ena Dawley, MD  07/31/2019, 9:25 AM

## 2019-07-31 NOTE — Progress Notes (Signed)
Speech Language Pathology Daily Session Note  Patient Details  Name: Jamie Mitchell MRN: [id number] Date of Birth: [birth year]  Today's Date: 07/31/2019 SLP Individual Time: 1000-1058 SLP Individual Time Calculation (min): 58 min  Short Term Goals: Week 1: SLP Short Term Goal 1 (Week 1): STG=LTG due to ELOS  Skilled Therapeutic Interventions: Pt was seen for skilled ST targeting dysphagia and speech goals. Spanish interpretor and pt's son present and appropriate engaged throughout session. SLP facilitated session with skilled observation of Dys 2 (minced) and Dys 3 (mech soft) texture snacks with thin liquids. Efficient mastication and oral clearance of both solid textures observed when provided with Min A verbal cues for use of liquid wash to clear residue. However, quantity of snacks was very small, as pt reported lack of appetite and declined more in-depth trials of advanced textures. Recommend larger additional trials of Dys 3 prior to further diet advancement - continue current diet for now.  Pt's verbal output of functional words and phrases increased today in comparison to previous encounters with pt - she is able to verbalize at the word and phrase levels when provided with a written visual of the words as Mod-Max A phonemic cues as well as intermittent cues to for segmenting multisyllabic words. At this time, in order to verbalize at the word and phrase level, written aids cannot be faded out. Pt also participated in automatic speech tasks such as days of the week and counting 1-10 with 80% accuracy and Max A multimodal cues (including written aids). Pt left laying in bed with alarm set and needs within reach, son still present. Son is eager to assist pt ambulate in the room - reinforced that he must be formally trained by OT/PT prior to assisting pt with transfers without staff present. Continue per current plan of care.       Pain Pain Assessment Pain Scale: 0-10 Pain Score: 0-No  pain  Therapy/Group: Individual Therapy  Arbutus Leas 07/31/2019, 12:15 PM

## 2019-07-31 NOTE — Progress Notes (Signed)
[place] PHYSICAL MEDICINE & REHABILITATION PROGRESS NOTE   Subjective/Complaints:  Interpreter in room  ROS: Limited due to aphasia.  Objective:   No results found. No results for input(s): WBC, HGB, HCT, PLT in the last 72 hours. Recent Labs    07/28/19 1939  NA 137  K 3.9  CL 105  CO2 21*  GLUCOSE 166*  BUN 23  CREATININE 1.05*  CALCIUM 8.5*    Intake/Output Summary (Last 24 hours) at 07/31/2019 1047 Last data filed at 07/31/2019 0817 Gross per 24 hour  Intake 1757 ml  Output --  Net 1757 ml     Physical Exam: Vital Signs Blood pressure 116/66, pulse (!) 57, temperature (!) 97.5 F (36.4 C), temperature source Oral, resp. rate 16, height 5\' 2"  (1.575 m), weight 70.5 kg, SpO2 99 %.  Nursing note and vitals reviewed. Constitutional: She appears well-developed and well-nourished. Appears comfortable.  General: No acute distress Mood and affect are appropriate Heart: Regular rate and rhythm no rubs murmurs or extra sounds Lungs: Clear to auscultation, breathing unlabored, no rales or wheezes Abdomen: Positive bowel sounds, soft nontender to palpation, nondistended Extremities: No clubbing, cyanosis, or edema Skin: No evidence of breakdown, no evidence of rash Neurologic: Cranial nerves II through XII intact, motor strength is 5/5 on left 4/5 on right  deltoid, bicep, tricep, grip, hip flexor, knee extensors, ankle dorsiflexor and plantar flexor Sensory exam normal sensation to light touch and proprioception in bilateral upper and lower extremities Cerebellar exam normal finger to nose to finger as well as heel to shin in bilateral upper and lower extremities Musculoskeletal: Full range of motion in all 4 extremities. No joint swelling    Assessment/Plan: 1. Functional deficits secondary to L MCA infarct which require 3+ hours per day of interdisciplinary therapy in a comprehensive inpatient rehab setting.  Physiatrist is providing close team supervision and 24  hour management of active medical problems listed below.  Physiatrist and rehab team continue to assess barriers to discharge/monitor patient progress toward functional and medical goals  Care Tool:  Bathing  Bathing activity did not occur: Safety/medical concerns Body parts bathed by patient: Right arm, Left arm, Chest, Abdomen, Front perineal area, Buttocks, Right upper leg, Left upper leg, Right lower leg, Left lower leg, Face         Bathing assist Assist Level: Supervision/Verbal cueing     Upper Body Dressing/Undressing Upper body dressing   What is the patient wearing?: Pull over shirt    Upper body assist Assist Level: Supervision/Verbal cueing(standing)    Lower Body Dressing/Undressing Lower body dressing      What is the patient wearing?: Underwear/pull up, Pants     Lower body assist Assist for lower body dressing: Supervision/Verbal cueing     Toileting Toileting Toileting Activity did not occur (Clothing management and hygiene only): Refused  Toileting assist Assist for toileting: Contact Guard/Touching assist     Transfers Chair/bed transfer  Transfers assist     Chair/bed transfer assist level: Supervision/Verbal cueing     Locomotion Ambulation   Ambulation assist      Assist level: Supervision/Verbal cueing Assistive device: No Device Max distance: 330   Walk 10 feet activity   Assist     Assist level: Supervision/Verbal cueing Assistive device: No Device   Walk 50 feet activity   Assist    Assist level: Supervision/Verbal cueing Assistive device: No Device    Walk 150 feet activity   Assist Walk 150 feet activity did not  occur: Safety/medical concerns  Assist level: Supervision/Verbal cueing Assistive device: No Device    Walk 10 feet on uneven surface  activity   Assist     Assist level: Minimal Assistance - Patient > 75% Assistive device: Hand held assist   Wheelchair     Assist Will patient use  wheelchair at discharge?: No Type of Wheelchair: Manual    Wheelchair assist level: Minimal Assistance - Patient > 75% Max wheelchair distance: 150    Wheelchair 50 feet with 2 turns activity    Assist        Assist Level: Minimal Assistance - Patient > 75%   Wheelchair 150 feet activity     Assist      Assist Level: Minimal Assistance - Patient > 75%   Blood pressure 116/66, pulse (!) 57, temperature (!) 97.5 F (36.4 C), temperature source Oral, resp. rate 16, height 5\' 2"  (1.575 m), weight 70.5 kg, SpO2 99 %.   Medical Problem List and Plan: 1.  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 D/ced 3/6 due to aflutter.  2.  Antithrombotics: -DVT/anticoagulation: SCDs- until OK'd by Neurology.              -antiplatelet therapy: Aspirin 81 mg daily 3. Pain Management: Tylenol as needed. Started Lidocaine patch for left shoulder.   3/4: Ordered diclofenac gel for left shoulder pain.   3/6: pain is well controlled 4. Mood: Advised emotional support             -antipsychotic agents: N/A 5. Neuropsych: This patient is not capable of making decisions on her own behalf due to aphasia. 6. Skin/Wound Care: Routine skin checks. May DC midline.  7. Fluids/Electrolytes/Nutrition: Routine in and outs with follow-up chemistries 8.  Atrial fibrillation and flutter.  Amiodarone 200 mg twice daily x1 month then decrease to daily.  3/7: back to NSR, appreciate cardiology input.  Reduce amiodarone to 200mg  daily at DC. Eliquis 5mg  BID restarted.  9.  Permissive hypertension.  Monitor with increased mobility.  Patient on HCTZ 25 mg daily prior to  admission. Vitals:   07/30/19 1936 07/31/19 0402  BP: 120/74 116/66  Pulse: 65 (!) 57  Resp: 16 16  Temp: 97.9 F (36.6 C) (!) 97.5 F (36.4 C)  SpO2: 96% 99%  Controlled 3/8 10.  Dysphagia.  Dysphagia #1 thin liquid diet.  Nasogastric tube currently in place for nutritional support -removed Cortrak this week and tolerating well.  11.  Hypothyroidism.  TSH level 6.781- 7.052.  Free T4 normal 0.80.  Synthroid 37.5 mg daily -suggest increase in dose since this was home dose.  12.  Hyperlipidemia.  Lipitor 13.  Prediabetes.  Hemoglobin A1c 6.1.  SSI. Will discuss further with son who has questions given first time on SSI     LOS: 6 days A FACE TO FACE EVALUATION WAS PERFORMED  Charlett Blake 07/31/2019, 10:47 AM

## 2019-07-31 NOTE — Progress Notes (Signed)
Occupational Therapy Session Note  Patient Details  Name: Jamie Mitchell MRN: [id number] Date of Birth: 1945/10/08  Today's Date: 07/31/2019 OT Individual Time: [phone] OT Individual Time Calculation (min): 55 min   Short Term Goals: Week 1:  OT Short Term Goal 1 (Week 1): STGs=LTGs secondary to short estimated LOS  Skilled Therapeutic Interventions/Progress Updates:    Pt greeted EOB with no c/o pain, finishing up her breakfast. Pt with no pocketing when assessed after meal. She was agreeable to shower. Pt ambulated in room to gather her clothing, stooping to 3rd dresser drawer with supervision assist. She proceeded into the bathroom without device and supervision and placed her clothes on the bar beside toilet. Pt doffed clothing sit<stand from TTB and then showered with OT managing on/off and temperature controls. Min vcs for rinsing all of the soap from her body when she was done. Pt did well with adding soap to wash cloth before bathing, scanning to Rt to locate the bottles on her shelf and dispense appropriately. Dressing completed sit<stand from toilet afterwards with vcs to sit vs stand to don LB garments. She was able to stoop to floor to retrieve socks and shoes and then don them with supervision for dynamic sitting balance. Afterwards pt stood at the sink to brush her hair and complete oral care with setup. Transitioned to IADL participation with pt ambulating to the therapy apartment without device and close supervision assist. She vacuumed the carpet once OT plugged in device, Min A for safe mgt of the chord. Continued working on higher level dynamic balance when she swept the kitchen floor, bending outside of base of support with supervision assist and no LOBs when using broom. She then ambulated back to the room, needing max multimodal cues for pathfinding. After taking a short rest break sitting in her recliner, pt made up her bed with setup of clean blankets and then returned  to bed to rest. Pt left with call bell, bed alarm set, in care of RN to receive her morning medication.    Therapy Documentation Precautions:  Precautions Precautions: Fall Restrictions Weight Bearing Restrictions: No Pain: pt denied pain during tx Pain Assessment Pain Scale: 0-10 Pain Score: 0-No pain ADL:        Therapy/Group: Individual Therapy  Davionna Blacksher A Kadience Macchi 07/31/2019, 12:51 PM

## 2019-07-31 NOTE — Progress Notes (Signed)
Physical Therapy Session Note  Patient Details  Name: Jamie Mitchell MRN: [id number] Date of Birth: 08/03/1945  Today's Date: 07/31/2019 PT Individual Time: 1303-1415 PT Individual Time Calculation (min): 72 min   Short Term Goals: Week 1:  PT Short Term Goal 1 (Week 1): STG=LTG due to ELOS  Skilled Therapeutic Interventions/Progress Updates: Pt presented in recliner with son present agreeable to therapy. Pt denies pain. Pt donned shoes with set up and performed ambulatory transfer to bathroom with supervision. Performed toilet transfers with supervision. Pt ambulated to rehab gym and participated in obstacle course weaving through cones, stepping onto 4in step and stepping over thresholds. Pt was able to safely negotiate over thresholds as well as onto step however required max cues for stepping/weaving around cones vs stepping over them. Pt was able to follow PTA around x 3 cones then would walk next to PTA and begin stepping over cones once again. Pt then ambulated with PTA in hallway as PTA would throw object in front of pt (Airex pad) and have pt attempt to walk around. Pt stepped around vs attempting to walk over pad approx 50% of time. Pt then ambulated to day room and participated in bouts of corn hold while standing on Airex with pt maintaining balance with moderate reaching tasks however requiring mod verbal cues to reach for bags. Pt was also able to ambulate and pick up bags off floor with close S. Session then transitioned to family ed with son and ambulation in community environment. Pt transported to [facility] entrance and pt ambulated with CGA fading to close S around patio. Pt able to safely ambulate however required max cues to sit and take rests. Pt returned back to unit and participated in NuStep L3 x 5 min and L2 x 2 min for global conditioning. Pt ambulated back to room and returned to recliner. Pt left with son present and needs met.      Therapy Documentation Precautions:   Precautions Precautions: Fall Restrictions Weight Bearing Restrictions: No   Therapy/Group: Individual Therapy  Rosita DeChalus  Rosita DeChalus, PTA  07/31/2019, 4:25 PM  

## 2019-08-01 ENCOUNTER — Inpatient Hospital Stay (HOSPITAL_COMMUNITY): Payer: Medicare Other | Admitting: Physical Therapy

## 2019-08-01 ENCOUNTER — Inpatient Hospital Stay (HOSPITAL_COMMUNITY): Payer: Medicare Other | Admitting: Speech Pathology

## 2019-08-01 ENCOUNTER — Inpatient Hospital Stay (HOSPITAL_COMMUNITY): Payer: Medicare Other | Admitting: Occupational Therapy

## 2019-08-01 LAB — GLUCOSE, CAPILLARY
Glucose-Capillary: 117 mg/dL — ABNORMAL HIGH (ref 70–99)
Glucose-Capillary: 114 mg/dL — ABNORMAL HIGH (ref 70–99)
Glucose-Capillary: 133 mg/dL — ABNORMAL HIGH (ref 70–99)
Glucose-Capillary: 131 mg/dL — ABNORMAL HIGH (ref 70–99)

## 2019-08-01 NOTE — Progress Notes (Signed)
[place] PHYSICAL MEDICINE & REHABILITATION PROGRESS NOTE   Subjective/Complaints: Interpreter in room  Reviewed CT result, resolving petechial hemorrhage   ROS: Limited due to aphasia.  Objective:   CT HEAD WO CONTRAST  Result Date: 07/31/2019 CLINICAL DATA:  Stroke, follow-up EXAM: CT HEAD WITHOUT CONTRAST TECHNIQUE: Contiguous axial images were obtained from the base of the skull through the vertex without intravenous contrast. COMPARISON:  07/24/2019 FINDINGS: Brain: Left frontal infarction is again identified involving the middle and inferior frontal gyri. There is increased gyriform density likely reflecting petechial hemorrhage. Associated edema has decreased. Expected evolution of hemorrhage on the prior studies. There is no loss gray-white differentiation.  No hydrocephalus. Vascular: No new finding. Skull: No new finding. Sinuses/Orbits: Mild mucosal thickening.  No acute orbital finding. Other: Persistent left mastoid and middle ear opacification. IMPRESSION: Evolving left frontal infarction with decreased edema. There is probable petechial hemorrhage without discrete hematoma. No significant mass effect. Electronically Signed   By: Macy Mis M.D.   On: 07/31/2019 15:31   No results for input(s): WBC, HGB, HCT, PLT in the last 72 hours. No results for input(s): NA, K, CL, CO2, GLUCOSE, BUN, CREATININE, CALCIUM in the last 72 hours.  Intake/Output Summary (Last 24 hours) at 08/01/2019 0844 Last data filed at 07/31/2019 1800 Gross per 24 hour  Intake 360 ml  Output --  Net 360 ml     Physical Exam: Vital Signs Blood pressure (!) 128/97, pulse 87, temperature (!) 97.4 F (36.3 C), temperature source Oral, resp. rate 18, height 5\' 2"  (1.575 m), weight 70.5 kg, SpO2 95 %.  Nursing note and vitals reviewed. Constitutional: She appears well-developed and well-nourished. Appears comfortable.   General: No acute distress Mood and affect are appropriate Heart: Regular rate  and rhythm no rubs murmurs or extra sounds Lungs: Clear to auscultation, breathing unlabored, no rales or wheezes Abdomen: Positive bowel sounds, soft nontender to palpation, nondistended Extremities: No clubbing, cyanosis, or edema Skin: No evidence of breakdown, no evidence of rash Neurologic: Cranial nerves II through XII intact, motor strength is 5/5 in Left 4/5 Right deltoid, bicep, tricep, grip, hip flexor, knee extensors, ankle dorsiflexor and plantar flexor Apahsic uses head nods and hand gestures for Y/N response with interpreter  Musculoskeletal: Full range of motion in all 4 extremities. No joint swelling    Assessment/Plan: 1. Functional deficits secondary to L MCA infarct which require 3+ hours per day of interdisciplinary therapy in a comprehensive inpatient rehab setting.  Physiatrist is providing close team supervision and 24 hour management of active medical problems listed below.  Physiatrist and rehab team continue to assess barriers to discharge/monitor patient progress toward functional and medical goals  Care Tool:  Bathing  Bathing activity did not occur: Safety/medical concerns Body parts bathed by patient: Right arm, Left arm, Chest, Abdomen, Front perineal area, Buttocks, Right upper leg, Left upper leg, Right lower leg, Left lower leg, Face         Bathing assist Assist Level: Supervision/Verbal cueing     Upper Body Dressing/Undressing Upper body dressing   What is the patient wearing?: Pull over shirt    Upper body assist Assist Level: Supervision/Verbal cueing(standing)    Lower Body Dressing/Undressing Lower body dressing      What is the patient wearing?: Underwear/pull up, Pants     Lower body assist Assist for lower body dressing: Supervision/Verbal cueing     Toileting Toileting Toileting Activity did not occur (Clothing management and hygiene only): Refused  Toileting  assist Assist for toileting: Contact Guard/Touching assist      Transfers Chair/bed transfer  Transfers assist     Chair/bed transfer assist level: Supervision/Verbal cueing     Locomotion Ambulation   Ambulation assist      Assist level: Supervision/Verbal cueing Assistive device: No Device Max distance: 381ft   Walk 10 feet activity   Assist     Assist level: Supervision/Verbal cueing Assistive device: No Device   Walk 50 feet activity   Assist    Assist level: Supervision/Verbal cueing Assistive device: No Device    Walk 150 feet activity   Assist Walk 150 feet activity did not occur: Safety/medical concerns  Assist level: Supervision/Verbal cueing Assistive device: No Device    Walk 10 feet on uneven surface  activity   Assist     Assist level: Supervision/Verbal cueing Assistive device: Other (comment)(no AD)   Wheelchair     Assist Will patient use wheelchair at discharge?: No Type of Wheelchair: Manual    Wheelchair assist level: Minimal Assistance - Patient > 75% Max wheelchair distance: 150    Wheelchair 50 feet with 2 turns activity    Assist        Assist Level: Minimal Assistance - Patient > 75%   Wheelchair 150 feet activity     Assist      Assist Level: Minimal Assistance - Patient > 75%   Blood pressure (!) 128/97, pulse 87, temperature (!) 97.4 F (36.3 C), temperature source Oral, resp. rate 18, height 5\' 2"  (1.575 m), weight 70.5 kg, SpO2 95 %.   Medical Problem List and Plan: 1.  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  Antithrombotics: -DVT/anticoagulation: SCDs- until OK'd by Neurology.               -antiplatelet therapy: Aspirin 81 mg daily 3. Pain Management: Tylenol as needed. Started Lidocaine patch for left shoulder.   3/4: Ordered diclofenac gel for left shoulder pain.   3/6: pain is well controlled 4. Mood: Advised emotional support             -antipsychotic agents: N/A 5. Neuropsych: This patient is not capable of making decisions on her own behalf due to aphasia. 6. Skin/Wound Care: Routine skin checks. May DC midline.  7. Fluids/Electrolytes/Nutrition: Routine in and outs with follow-up chemistries 8.  Atrial fibrillation and flutter.  Amiodarone 200 mg twice daily x1 month then decrease to daily.  Rate controlled   Reduce amiodarone to 200mg  daily at DC. Eliquis 5mg  BID restarted.  9.  Permissive hypertension.  Monitor with increased mobility.  Patient on HCTZ 25 mg daily prior to admission. Vitals:   07/31/19 2108 08/01/19 0349  BP: 116/81 (!) 128/97  Pulse: 60 87  Resp: 18 18  Temp: 98.2 F (36.8 C) (!) 97.4 F (36.3 C)  SpO2: 93% 95%  Controlled 3/9 10.  Dysphagia.  Dysphagia #2 thin liquid diet.  ? Upgrade to D3 prior to D/C 11.  Hypothyroidism.  TSH level 6.781- 7.052.  Free T4 normal 0.80.  Synthroid 37.5 mg daily -  suggest increase in dose since this was home dose.  12.  Hyperlipidemia.  Lipitor 13.  Prediabetes.  Hemoglobin A1c 6.1.  SSI. Will discuss further with son who has questions given first time on SSI     LOS: 7 days A FACE TO [place] E Kimmie Berggren 08/01/2019, 8:44 AM

## 2019-08-01 NOTE — Discharge Summary (Signed)
Physician Discharge Summary  Patient ID: Jamie Mitchell MRN: [id number] DOB/AGE: May 09, 1946 74 y.o.  Admit date: 07/25/2019 Discharge date: 08/03/2019  Discharge Diagnoses:  Principal Problem:   Left middle cerebral artery stroke [facility]) Active Problems:   Hypothyroidism   Hypertension   Atrial fibrillation with RVR (HCC)   Prediabetes   Aphasia as late effect of cerebrovascular accident (CVA)   Dysphagia Hypothyroidism Hyperlipidemia  Discharged Condition: Stable  Significant Diagnostic Studies: CT Code Stroke CTA Head W/WO contrast  Result Date: 07/19/2019 CLINICAL DATA:  Initial evaluation for acute right-sided weakness. EXAM: CT ANGIOGRAPHY HEAD AND NECK TECHNIQUE: Multidetector CT imaging of the head and neck was performed using the standard protocol during bolus administration of intravenous contrast. Multiplanar CT image reconstructions and MIPs were obtained to evaluate the vascular anatomy. Carotid stenosis measurements (when applicable) are obtained utilizing NASCET criteria, using the distal internal carotid diameter as the denominator. CONTRAST:  79m OMNIPAQUE IOHEXOL 350 MG/ML SOLN COMPARISON:  Prior head CT from earlier same day. FINDINGS: CTA NECK FINDINGS Aortic arch: Visualized aortic arch normal in caliber with normal branch pattern. Bovine branching pattern with common origin of the right brachiocephalic and left common carotid artery noted. No hemodynamically significant stenosis seen about the origin of the great vessels. Subclavian arteries widely patent. Right carotid system: Right common and internal carotid arteries widely patent to the skull base without stenosis, dissection or occlusion. Proximal right ICA medialized into the retropharyngeal space. Focal tortuosity the distal cervical right ICA noted. Left carotid system: Left common and internal carotid arteries widely patent to the skull base without stenosis, dissection or occlusion. Proximal left ICA  medialized into the retropharyngeal space. Evaluation the distal cervical left ICA limited by motion. Vertebral arteries: Both vertebral arteries arise from the subclavian arteries. Vertebral arteries mildly tortuous but widely patent within the neck without stenosis, dissection or occlusion. Skeleton: No acute osseous abnormality. No discrete lytic or blastic osseous lesions. Prominent degenerative changes noted at the C1-2 articulation, greater on the right. Other neck: No other acute soft tissue abnormality within the neck. No mass lesion or adenopathy. Upper chest: Mild scattered atelectatic changes noted within the visualized lungs. Visualized upper chest demonstrates no acute finding. Review of the MIP images confirms the above findings CTA HEAD FINDINGS Anterior circulation: Examination mildly degraded by motion artifact. Petrous, cavernous, and supraclinoid ICAs widely patent without flow-limiting stenosis. ICA termini well perfused. A1 segments patent bilaterally. Normal anterior communicating artery complex. Anterior cerebral arteries widely patent to their distal aspects without stenosis. Right M1 widely patent. Normal right MCA bifurcation. Distal right MCA branches well perfused. Left M1 widely patent. Normal left MCA bifurcation. Occlusive thrombus seen within a proximal left M2 branch, superior division (series 7, image 94), acute in nature. Attenuated opacification of left MCA branches distally, either due to subocclusive thrombus and/or moderate collateralization. Remainder of the left MCA branches remain patent. Posterior circulation: Vertebral arteries grossly patent to the vertebrobasilar junction without appreciable stenosis, although evaluation limited by motion. Neither PICA well visualized. Basilar diminutive but patent to its distal aspect without appreciable stenosis. Superior cerebral arteries patent bilaterally. Predominant fetal type origin of both PCAs, supplied via widely patent and  robust posterior communicating arteries. Both PCAs well perfused to their distal aspects. Venous sinuses: Grossly patent allowing for timing the contrast bolus. Anatomic variants: Fetal type origin of the PCAs with overall diminutive vertebrobasilar system. No appreciable aneurysm. Review of the MIP images confirms the above findings IMPRESSION: 1. Positive CTA for emergent large  vessel occlusion, with occlusive thrombus within a proximal left M2 branch, superior division. Attenuated flow distally either due to subocclusive thrombus and/or collateralization. 2. Diffuse tortuosity about the major arterial vasculature of the head and neck, suggesting chronic underlying hypertension. No other hemodynamically significant or correctable stenosis. 3. Fetal type origin of the PCAs with overall diminutive vertebrobasilar system. Critical Value/emergent results were called by telephone at the time of interpretation on 07/19/2019 at 11:26 pm to provider ERIC [facility] , who verbally acknowledged these results. Electronically Signed   By: Jeannine Boga M.D.   On: 07/19/2019 23:53   DG Chest 1 View  Result Date: 07/20/2019 CLINICAL DATA:  Intubation EXAM: CHEST  1 VIEW COMPARISON:  None. FINDINGS: Endotracheal tube tip is positioned within the proximal right mainstem bronchus. Heart size is mildly enlarged. No focal airspace consolidation. No pleural effusion or pneumothorax. IMPRESSION: 1. Endotracheal tube tip within the proximal right mainstem bronchus. Recommend retraction approximately 3.0 cm. 2. Mild cardiomegaly. These results will be called to the ordering clinician or representative by the Radiologist Assistant, and communication documented in the PACS or zVision Dashboard. Electronically Signed   By: Davina Poke D.O.   On: 07/20/2019 10:36   CT HEAD WO CONTRAST  Result Date: 07/31/2019 CLINICAL DATA:  Stroke, follow-up EXAM: CT HEAD WITHOUT CONTRAST TECHNIQUE: Contiguous axial images were obtained from  the base of the skull through the vertex without intravenous contrast. COMPARISON:  07/24/2019 FINDINGS: Brain: Left frontal infarction is again identified involving the middle and inferior frontal gyri. There is increased gyriform density likely reflecting petechial hemorrhage. Associated edema has decreased. Expected evolution of hemorrhage on the prior studies. There is no loss gray-white differentiation.  No hydrocephalus. Vascular: No new finding. Skull: No new finding. Sinuses/Orbits: Mild mucosal thickening.  No acute orbital finding. Other: Persistent left mastoid and middle ear opacification. IMPRESSION: Evolving left frontal infarction with decreased edema. There is probable petechial hemorrhage without discrete hematoma. No significant mass effect. Electronically Signed   By: Macy Mis M.D.   On: 07/31/2019 15:31   CT HEAD WO CONTRAST  Result Date: 07/24/2019 CLINICAL DATA:  Stroke, follow-up. Revascularization of left M2 division. EXAM: CT HEAD WITHOUT CONTRAST TECHNIQUE: Contiguous axial images were obtained from the base of the skull through the vertex without intravenous contrast. COMPARISON:  CT head without contrast 07/20/2019. MR head without contrast 07/22/2019 FINDINGS: Brain: Left frontal operculum nonhemorrhagic infarct is stable. Punctate infarcts of the left parietal lobe are not appreciated by CT. Trace blood in the left sylvian fissure is resolving. Mass effect with 3 mm midline shift is noted. Ventricles are otherwise normal. No new infarcts are present. The brainstem and cerebellum are normal. Scattered parenchymal calcifications are again seen. Vascular: No hyperdense vessel or unexpected calcification. Skull: No significant extracranial soft tissue lesion is present. Calvarium is intact. No focal lytic or blastic lesions are present. Sinuses/Orbits: Chronic left middle ear and mastoid effusion is present. Paranasal sinuses and right mastoid air cells are clear. The globes and  orbits are within normal limits. IMPRESSION: 1. Stable left frontal operculum nonhemorrhagic infarct. 2. Resolving subarachnoid blood in the left Sylvian fissure. 3. No new infarcts. 4. Chronic left middle ear and mastoid effusion. Electronically Signed   By: San Morelle M.D.   On: 07/24/2019 06:27   CT HEAD WO CONTRAST  Result Date: 07/20/2019 CLINICAL DATA:  Follow-up stroke EXAM: CT HEAD WITHOUT CONTRAST TECHNIQUE: Contiguous axial images were obtained from the base of the skull through the vertex without  intravenous contrast. COMPARISON:  Head CT earlier same day. FINDINGS: Brain: Acute infarction of the left frontal operculum, anterior insular and subinsular brain as shown previously. Low-density and swelling but no evidence of further extension of the infarction. Small amount of subarachnoid hemorrhage at the base of the brain and within the sylvian fissure on the left is becoming slightly less dense. No evidence of additional or worsening hemorrhage. No significant mass effect. One or 2 mm of left-to-right midline shift at the most. No other area of acute or subacute infarction. 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 No hydrocephalus. No extra-axial collection. No intraparenchymal hemorrhage in the region of infarction. Vascular: No new primary vascular finding. Skull: Negative Sinuses/Orbits: Clear/normal Other: None IMPRESSION: Subarachnoid hemorrhage at the base of the brain and sylvian fissure on the left is becoming less dense. No evidence of additional bleeding. Well-circumscribed infarction in the left frontal operculum, insula and subinsular brain as seen previously. No evidence of infarct extension. Mild regional swelling with left-to-right shift of 1-2 mm. No infarct hematoma. Electronically Signed   By: Nelson Chimes M.D.   On: 07/20/2019 21:53   CT HEAD WO CONTRAST  Result Date: 07/20/2019 CLINICAL DATA:  Stroke,  follow-up. EXAM: CT HEAD WITHOUT CONTRAST TECHNIQUE: Contiguous axial images were obtained from the base of the skull through the vertex without intravenous contrast. COMPARISON:  Postprocedure head CT 07/20/2019, noncontrast head CT and CT angiogram head/neck 07/19/2019 FINDINGS: Brain: Mildly motion degraded examination. There is residual circulating contrast. There has been continued further demarcation of an acute ischemic infarct centered within the anterolateral left frontal lobe with involvement of the left frontal operculum as well as left insula and subinsular region. There may be subtle petechial hemorrhage in the infarction territory. There is no significant mass effect at this time. No midline shift. Small to moderate volume acute subarachnoid hemorrhage within the left sylvian fissure and extending to the basal cisterns does not appear significantly changed as compared postprocedural head CT 07/19/2019. No evidence of hydrocephalus. Vascular: Circulating contrast material limits evaluation for abnormal vascular hyperdensity. Skull: Normal. Negative for fracture or focal lesion. Sinuses/Orbits: Visualized orbits demonstrate no acute abnormality. Mild right maxillary sinus mucosal thickening. Redemonstrated left middle ear/mastoid effusion. IMPRESSION: Continued further demarcation of an acute ischemic infarct centered within the anterolateral left frontal lobe with involvement of the left frontal operculum as well as left insula and subinsular region. There may be mild petechial hemorrhage in the infarction territory. No significant mass effect at this time. Small to moderate volume acute subarachnoid hemorrhage within the left sylvian fissure and extending to the basal cisterns, not significantly changed from postprocedural head CT 07/19/2019. No evidence of hydrocephalus. Electronically Signed   By: Kellie Simmering DO   On: 07/20/2019 09:10   CT Code Stroke CTA Neck W/WO contrast  Result Date:  07/19/2019 CLINICAL DATA:  Initial evaluation for acute right-sided weakness. EXAM: CT ANGIOGRAPHY HEAD AND NECK TECHNIQUE: Multidetector CT imaging of the head and neck was performed using the standard protocol during bolus administration of intravenous contrast. Multiplanar CT image reconstructions and MIPs were obtained to evaluate the vascular anatomy. Carotid stenosis measurements (when applicable) are obtained utilizing NASCET criteria, using the distal internal carotid diameter as the denominator. CONTRAST:  80m OMNIPAQUE IOHEXOL 350 MG/ML SOLN COMPARISON:  Prior head CT from earlier same day. FINDINGS: CTA NECK FINDINGS Aortic arch: Visualized aortic arch normal in caliber with normal branch pattern. Bovine branching pattern with common origin of the  right brachiocephalic and left common carotid artery noted. No hemodynamically significant stenosis seen about the origin of the great vessels. Subclavian arteries widely patent. Right carotid system: Right common and internal carotid arteries widely patent to the skull base without stenosis, dissection or occlusion. Proximal right ICA medialized into the retropharyngeal space. Focal tortuosity the distal cervical right ICA noted. Left carotid system: Left common and internal carotid arteries widely patent to the skull base without stenosis, dissection or occlusion. Proximal left ICA medialized into the retropharyngeal space. Evaluation the distal cervical left ICA limited by motion. Vertebral arteries: Both vertebral arteries arise from the subclavian arteries. Vertebral arteries mildly tortuous but widely patent within the neck without stenosis, dissection or occlusion. Skeleton: No acute osseous abnormality. No discrete lytic or blastic osseous lesions. Prominent degenerative changes noted at the C1-2 articulation, greater on the right. Other neck: No other acute soft tissue abnormality within the neck. No mass lesion or adenopathy. Upper chest: Mild  scattered atelectatic changes noted within the visualized lungs. Visualized upper chest demonstrates no acute finding. Review of the MIP images confirms the above findings CTA HEAD FINDINGS Anterior circulation: Examination mildly degraded by motion artifact. Petrous, cavernous, and supraclinoid ICAs widely patent without flow-limiting stenosis. ICA termini well perfused. A1 segments patent bilaterally. Normal anterior communicating artery complex. Anterior cerebral arteries widely patent to their distal aspects without stenosis. Right M1 widely patent. Normal right MCA bifurcation. Distal right MCA branches well perfused. Left M1 widely patent. Normal left MCA bifurcation. Occlusive thrombus seen within a proximal left M2 branch, superior division (series 7, image 94), acute in nature. Attenuated opacification of left MCA branches distally, either due to subocclusive thrombus and/or moderate collateralization. Remainder of the left MCA branches remain patent. Posterior circulation: Vertebral arteries grossly patent to the vertebrobasilar junction without appreciable stenosis, although evaluation limited by motion. Neither PICA well visualized. Basilar diminutive but patent to its distal aspect without appreciable stenosis. Superior cerebral arteries patent bilaterally. Predominant fetal type origin of both PCAs, supplied via widely patent and robust posterior communicating arteries. Both PCAs well perfused to their distal aspects. Venous sinuses: Grossly patent allowing for timing the contrast bolus. Anatomic variants: Fetal type origin of the PCAs with overall diminutive vertebrobasilar system. No appreciable aneurysm. Review of the MIP images confirms the above findings IMPRESSION: 1. Positive CTA for emergent large vessel occlusion, with occlusive thrombus within a proximal left M2 branch, superior division. Attenuated flow distally either due to subocclusive thrombus and/or collateralization. 2. Diffuse  tortuosity about the major arterial vasculature of the head and neck, suggesting chronic underlying hypertension. No other hemodynamically significant or correctable stenosis. 3. Fetal type origin of the PCAs with overall diminutive vertebrobasilar system. Critical Value/emergent results were called by telephone at the time of interpretation on 07/19/2019 at 11:26 pm to provider ERIC [facility] , who verbally acknowledged these results. Electronically Signed   By: Jeannine Boga M.D.   On: 07/19/2019 23:53   MR ANGIO HEAD WO CONTRAST  Result Date: 07/22/2019 CLINICAL DATA:  Stroke follow-up. Proximal left M2 occlusion status post revascularization. EXAM: MRI HEAD WITHOUT CONTRAST MRA HEAD WITHOUT CONTRAST TECHNIQUE: Multiplanar, multiecho pulse sequences of the brain and surrounding structures were obtained without intravenous contrast. Angiographic images of the head were obtained using MRA technique without contrast. COMPARISON:  Head CT 07/20/2019 and CTA 07/19/2019 FINDINGS: MRI HEAD FINDINGS The study is mildly motion degraded. Brain: A large acute infarct is again noted in the left MCA superior division involving the lateral and anterior  aspects of the frontal lobe and insula with cytotoxic edema, minimal petechial hemorrhage, and unchanged trace rightward midline shift. Scattered punctate acute infarcts are also present in the left parietal and superior occipital lobes. Susceptibility artifact in the left sylvian fissure and FLAIR signal within multiple sulci of the posterior left greater than right cerebral hemispheres is compatible with previously demonstrated small volume subarachnoid hemorrhage. Several scattered chronic microhemorrhages are noted peripherally in both cerebral hemispheres. There is no extra-axial fluid collection. Vascular: Major intracranial vascular flow voids are preserved. Skull and upper cervical spine: Unremarkable bone marrow signal. Sinuses/Orbits: Unremarkable orbits.  Moderate left mastoid effusion. Clear paranasal sinuses. Other: None. MRA HEAD FINDINGS The visualized distal vertebral arteries are widely patent to the basilar and codominant. Patent AICAs and SCA is are seen bilaterally. The basilar artery is widely patent. There are large posterior communicating arteries bilaterally with hypoplastic right and absent left P1 segments. Both PCAs are patent without evidence of significant proximal stenosis. The internal carotid arteries are widely patent from skull base to carotid termini with ectasias of both cavernous segments. ACAs and MCAs are patent without evidence of significant A1 or M1 stenosis. The proximal left M2 superior division remains patent following revascularization, however there is a severe stenosis near/slightly distal to the site of previous occlusion. There is also asymmetric attenuation of more distal branch vessels in the left MCA superior division. No aneurysm is identified. IMPRESSION: 1. 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 2. Punctate acute left parietal and occipital infarcts. 3. Residual small volume subarachnoid hemorrhage. 4. 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 Electronically Signed   By: Logan Bores M.D.   On: 07/22/2019 18:25   MR BRAIN WO CONTRAST  Result Date: 07/22/2019 CLINICAL DATA:  Stroke follow-up. Proximal left M2 occlusion status post revascularization. EXAM: MRI HEAD WITHOUT CONTRAST MRA HEAD WITHOUT CONTRAST TECHNIQUE: Multiplanar, multiecho pulse sequences of the brain and surrounding structures were obtained without intravenous contrast. Angiographic images of the head were obtained using MRA technique without contrast. COMPARISON:  Head CT 07/20/2019 and CTA 07/19/2019 FINDINGS: MRI HEAD FINDINGS The study is mildly motion degraded. Brain: A large acute infarct is again noted in the left  MCA superior division involving the lateral and anterior aspects of the frontal lobe and insula with cytotoxic edema, minimal petechial hemorrhage, and unchanged trace rightward midline shift. Scattered punctate acute infarcts are also present in the left parietal and superior occipital lobes. Susceptibility artifact in the left sylvian fissure and FLAIR signal within multiple sulci of the posterior left greater than right cerebral hemispheres is compatible with previously demonstrated small volume subarachnoid hemorrhage. Several scattered chronic microhemorrhages are noted peripherally in both cerebral hemispheres. There is no extra-axial fluid collection. Vascular: Major intracranial vascular flow voids are preserved. Skull and upper cervical spine: Unremarkable bone marrow signal. Sinuses/Orbits: Unremarkable orbits. Moderate left mastoid effusion. Clear paranasal sinuses. Other: None. MRA HEAD FINDINGS The visualized distal vertebral arteries are widely patent to the basilar and codominant. Patent AICAs and SCA is are seen bilaterally. The basilar artery is widely patent. There are large posterior communicating arteries bilaterally with hypoplastic right and absent left P1 segments. Both PCAs are patent without evidence of significant proximal stenosis. The internal carotid arteries are widely patent from skull base to carotid termini with ectasias of both cavernous segments. ACAs and MCAs are patent without evidence of significant A1 or M1 stenosis. The  proximal left M2 superior division remains patent following revascularization, however there is a severe stenosis near/slightly distal to the site of previous occlusion. There is also asymmetric attenuation of more distal branch vessels in the left MCA superior division. No aneurysm is identified. IMPRESSION: 1. 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 2. Punctate acute left parietal  and occipital infarcts. 3. Residual small volume subarachnoid hemorrhage. 4. 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 Electronically Signed   By: Logan Bores M.D.   On: 07/22/2019 18:25   IR CT Head Ltd  Result Date: 07/20/2019 INDICATION: 74 year old female with past medical history significant for hypothyroidism presenting as a code stroke with right-sided hemiplegia and new onset atrial fibrillation. Her last known well was 21:50 on 07/19/2019. Head CT showed loss of gray-white differentiation in the left frontal for colon extending to the anterior aspect of the insula with no hemorrhage. CT angiogram showed a left M2/MCA/superior division branch occlusion. NIHSS 25. EXAM: Diagnostic cerebral angiogram Mechanical thrombectomy Flat panel head CT COMPARISON:  CT/CT angiogram of the head and neck July 19, 2019 MEDICATIONS: 5 mg of problem you have intra arterial to the left ICA. ANESTHESIA/SEDATION: General anesthesia CONTRAST:  75 mL FLUOROSCOPY TIME:  Fluoroscopy Time: 55 minutes 18 seconds (1,796 mGy). COMPLICATIONS: Moderate left sylvian subarachnoid hemorrhage TECHNIQUE: Informed written consent was obtained from the patient's son after a thorough discussion of the procedural risks, benefits and alternatives. All questions were addressed. Maximal Sterile Barrier Technique was utilized including caps, mask, sterile gowns, sterile gloves, sterile drape, hand hygiene and skin antiseptic. A timeout was performed prior to the initiation of the procedure. Using a micropuncture kit and modified Seldinger technique, access was gained to the right common femoral artery and an 8 French sheath was placed in the right common femoral artery. Under fluoroscopy, an 8 [place] Walrus balloon guide catheter was navigated over a 6 range Berenstein 2 catheter and a 0.035 inch Terumo Glidewire into the aortic arch. Under fluoroscopy, the catheter was advanced into the  cervical right internal carotid artery. The wire and 6 French catheter were removed and angiograms of the head were obtained in frontal and lateral views. FINDINGS: There is a left M2/MCA superior division branch occlusion. Prominent tortuosity of the cervical left ICA and left carotid siphon. PROCEDURE: A large bore catheter aspiration catheter was navigated through the balloon guide catheter and over a phenom 21 microcatheter and a synchro2 support microguidewire into the cavernous segment of the left ICA. The microcatheter was then advanced into the left M2/MCA superior division branch, distal to the point of occlusion. Frontal and lateral angiograms were obtained with microcatheter contrast injection. A 4 x 40 mm solitaire stent retriever was subsequently deployed spanning the distal left M1-mid M2. The device was allowed to intercalated with the clot for 4 minutes. The microcatheter was removed. The guiding catheter balloon was inflated and constant aspiration was performed, the aspiration catheter was navigated near the occlusion and connected to a penumbra aspiration pump. The stent retriever was then retracted along with the aspiration catheter. Frontal and lateral angiograms were obtained showing persistent left M2/MCA occlusion. In a similar fashion and using the same platform, a second stent retriever pass was performed with the solitaire device. Frontal and lateral angiograms were obtained showing persistent left M2/MCA occlusion with the clot migrating proximally. A catalyst 6 aspiration catheter was navigated through the balloon guide catheter and  over a phenom 21 microcatheter and posterior 0.014 inch Aristotle microguidewire into the cavernous segment of the left ICA. The microcatheter was then advanced into the left M2/MCA superior division branch, distal to the point of occlusion. Frontal and lateral angiograms were obtained with microcatheter contrast injection. A 4 x 41 mm trevo stent retriever  was subsequently deployed spanning the distal left M1-mid M2. The device was allowed to intercalated with the clot for 4 minutes. The microcatheter was removed. The guiding catheter balloon was inflated and constant aspiration was performed, the aspiration catheter was navigated near the occlusion and connected to a penumbra aspiration pump. The stent retriever was then retracted along with the aspiration catheter. Frontal and lateral angiograms were obtained showing distal contrast penetration into the left M2/MCA superior division branch with severe vasospasm. Intra arterial infusion of 5 mg of verapamil was performed over 10 minutes. Follow-up angiogram showed reocclusion of the left M2/MCA superior division branch. A 3 max aspiration catheter was navigated through the balloon guide catheter and over the Aristotle microguidewire into the left and 2/MCA superior division branch at the level of occlusion. Continuous is aspiration was performed for 4 minutes. The guiding catheter balloon was inflated and the aspiration catheter was then removed. Follow-up left ICA angiograms with frontal and lateral views showed recanalization of the left M2/MCA superior division branch with slow flow in the distal angular branch (TICI 2C). A flat panel CT of the head was then obtained. Moderate left sylvian subarachnoid hemorrhage was identified. The catheter was then removed. A right common femoral artery angiogram was obtained via sheath side port. A 6 French Perclose ProGlide was utilized for right, femoral access closure. Immediate hemostasis was achieved. Patient was transferred to ICU for continued monitoring. Family and neurology team communicated immediately after the end of the procedure. IMPRESSION: 1. Left M2/MCA superior division branch occlusion. 2. Successful mechanical thrombectomy/aspiration performed with TICI 2C final recanalization. A total of 3 stent retriever passes and 1 aspiration pass performed. 3. Moderate  size left sylvian subarachnoid hemorrhage seen on postprocedure flat panel CT. PLAN: Continued monitoring in ICU level of care. Repeat head CT within 4 hours to evaluate for subarachnoid hemorrhage stability. Electronically Signed   By: Pedro Earls M.D.   On: 07/20/2019 09:27   DG Chest Port 1 View  Result Date: 07/21/2019 CLINICAL DATA:  Respiratory failure. Stroke. EXAM: PORTABLE CHEST 1 VIEW COMPARISON:  One-view chest x-ray 07/19/2018 FINDINGS: Endotracheal tube is been pulled back, now terminating at the clavicles, well above the carina. Lung volumes remain low. Pulmonary vascular congestion is unchanged. IMPRESSION: 1. Repositioning of endotracheal tube now in satisfactory position. 2. Stable low lung volumes and moderate pulmonary vascular congestion. Electronically Signed   By: San Morelle M.D.   On: 07/21/2019 08:36   DG Swallowing Func-Speech Pathology  Result Date: 07/24/2019 Objective Swallowing Evaluation: Type of Study: MBS-Modified Barium Swallow Study  Patient Details Name: Jamie Mitchell MRN: [id number] Date of Birth: [birth year] Today's Date: 07/24/2019 Time: SLP Start Time (ACUTE ONLY): 0810 -SLP Stop Time (ACUTE ONLY): 0830 SLP Time Calculation (min) (ACUTE ONLY): 20 min Past Medical History: Past Medical History: Diagnosis Date . Hypertension  . Thyroid disease  Past Surgical History: Past Surgical History: Procedure Laterality Date . IR CT HEAD LTD  07/20/2019 . IR PERCUTANEOUS ART THROMBECTOMY/INFUSION INTRACRANIAL INC DIAG ANGIO  07/20/2019 . RADIOLOGY WITH ANESTHESIA N/A 07/20/2019  Procedure: IR WITH ANESTHESIA;  Surgeon: Luanne Bras, MD;  Location: [facility];  Service: Radiology;  Laterality: N/A; HPI: 74 yo F who presented with right sided weakness found to have acute left M2/ MCA occlusion s/p tPA and emergent thrombectomy which was complicated by moderate left sylvian SAH.  On 2/25 patient with Afib/ Aflutter with RVR and post conversion pauses.  VDRF and was extubated am of 2/27.   No data recorded Assessment / Plan / Recommendation CHL IP CLINICAL IMPRESSIONS 07/24/2019 Clinical Impression Pt demonstrates a primary oral dysphagia with slow volitional initaition of swallow activities.  Paticular difficulty initiating and coordinating mastication, which was slow, but complete with mild right buccal residue.  Pt has mild right labial weakness with anterior spillage with large quantitiy rapid self feeding. She is able to sip from a straw, and ultimately this was most effective, with assist, to control bolus size and rate. When self feeding pt will often take multiple sips with complete oral cohesion of the bolus before triggered posterior transit and swallow response. There is therefore typically piecemeal transit of a very large bolus. Pharyngeal phase generally is WNL except for some mild oral/base of tongue transit that accumulates in the valleculae. Pt only experienced one instance of sensed aspiration when allowed to very impulsively take independent cup sips, throwing head back and resulting in an instance of premature spillage. Cough response immediate, hard and effective. Will initiate a dys 1 (puree) diet with thin liquids with good potential for upgraded solids with therapy. Suspect automaticity will improve with intake.  SLP Visit Diagnosis Dysphagia, oropharyngeal phase (R13.12) Attention and concentration deficit following -- Frontal lobe and executive function deficit following -- Impact on safety and function --   CHL IP TREATMENT RECOMMENDATION 07/24/2019 Treatment Recommendations Therapy as outlined in treatment plan below   Prognosis 07/24/2019 Prognosis for Safe Diet Advancement Good Barriers to Reach Goals Language deficits Barriers/Prognosis Comment -- CHL IP DIET RECOMMENDATION 07/24/2019 SLP Diet Recommendations Dysphagia 1 (Puree) solids;Thin liquid Liquid Administration via Straw Medication Administration Crushed with puree Compensations Slow  rate;Small sips/bites Postural Changes Seated upright at 90 degrees   CHL IP OTHER RECOMMENDATIONS 07/24/2019 Recommended Consults -- Oral Care Recommendations Oral care BID Other Recommendations --   CHL IP FOLLOW UP RECOMMENDATIONS 07/24/2019 Follow up Recommendations Inpatient Rehab   CHL IP FREQUENCY AND DURATION 07/24/2019 Speech Therapy Frequency (ACUTE ONLY) min 2x/week Treatment Duration 2 weeks      CHL IP ORAL PHASE 07/24/2019 Oral Phase Impaired Oral - Pudding Teaspoon -- Oral - Pudding Cup -- Oral - Honey Teaspoon -- Oral - Honey Cup -- Oral - Nectar Teaspoon Delayed oral transit;Piecemeal swallowing;Lingual/palatal residue Oral - Nectar Cup Delayed oral transit;Piecemeal swallowing;Lingual/palatal residue;Right anterior bolus loss Oral - Nectar Straw -- Oral - Thin Teaspoon -- Oral - Thin Cup Delayed oral transit;Piecemeal swallowing;Lingual/palatal residue Oral - Thin Straw Delayed oral transit;Piecemeal swallowing;Lingual/palatal residue Oral - Puree Delayed oral transit;Piecemeal swallowing;Lingual/palatal residue Oral - Mech Soft Delayed oral transit;Piecemeal swallowing;Lingual/palatal residue;Right pocketing in lateral sulci;Impaired mastication Oral - Regular -- Oral - Multi-Consistency -- Oral - Pill -- Oral Phase - Comment --  CHL IP PHARYNGEAL PHASE 07/24/2019 Pharyngeal Phase Impaired Pharyngeal- Pudding Teaspoon -- Pharyngeal -- Pharyngeal- Pudding Cup -- Pharyngeal -- Pharyngeal- Honey Teaspoon -- Pharyngeal -- Pharyngeal- Honey Cup -- Pharyngeal -- Pharyngeal- Nectar Teaspoon WFL Pharyngeal -- Pharyngeal- Nectar Cup WFL Pharyngeal -- Pharyngeal- Nectar Straw -- Pharyngeal -- Pharyngeal- Thin Teaspoon -- Pharyngeal -- Pharyngeal- Thin Cup Penetration/Aspiration before swallow;Pharyngeal residue - valleculae Pharyngeal Material enters airway, passes BELOW cords then ejected out;Material does not enter airway Pharyngeal- Thin  Straw WFL Pharyngeal -- Pharyngeal- Puree Pharyngeal residue -  valleculae;Reduced tongue base retraction Pharyngeal -- Pharyngeal- Mechanical Soft -- Pharyngeal -- Pharyngeal- Regular Pharyngeal residue - valleculae;Reduced tongue base retraction Pharyngeal -- Pharyngeal- Multi-consistency -- Pharyngeal -- Pharyngeal- Pill -- Pharyngeal -- Pharyngeal Comment --  No flowsheet data found. Herbie Baltimore, MA CCC-SLP Acute Rehabilitation Services Pager [phone] Office [phone] Lynann Beaver 07/24/2019, 9:03 AM              ECHOCARDIOGRAM COMPLETE  Result Date: 07/20/2019    ECHOCARDIOGRAM REPORT   Patient Name:   ANJALEE COPE Date of Exam: 07/20/2019 Medical Rec #:  [id number]               Height:       62.0 in Accession #:    [id number]              Weight:       151.7 lb Date of Birth:  11-25-1945              BSA:          1.700 m Patient Age:    58 years                BP:           124/68 mmHg Patient Gender: F                       HR:           79 bpm. Exam Location:  Inpatient Procedure: 2D Echo, Cardiac Doppler and Color Doppler STAT ECHO Indications:    CVA  History:        Patient has no prior history of Echocardiogram examinations. PAD                 and Stroke, Arrythmias:Atrial Fibrillation and Tachy-Brady; Risk                 Factors:Hypertension and Dyslipidemia.  Sonographer:    Dustin Flock Referring Phys: [place]  1. Left ventricular ejection fraction, by estimation, is 60 to 65%. The left ventricle has normal function. The left ventricle has no regional wall motion abnormalities. Left ventricular diastolic parameters were normal.  2. Right ventricular systolic function is normal. The right ventricular size is normal. Tricuspid regurgitation signal is inadequate for assessing PA pressure.  3. The mitral valve is normal in structure and function. Mild mitral valve regurgitation. No evidence of mitral stenosis.  4. The aortic valve is normal in structure and function. Aortic valve regurgitation is not  visualized. No aortic stenosis is present.  5. The inferior vena cava is normal in size with greater than 50% respiratory variability, suggesting right atrial pressure of 3 mmHg. FINDINGS  Left Ventricle: Left ventricular ejection fraction, by estimation, is 60 to 65%. The left ventricle has normal function. The left ventricle has no regional wall motion abnormalities. The left ventricular internal cavity size was normal in size. There is  no left ventricular hypertrophy. Left ventricular diastolic parameters were normal. Normal left ventricular filling pressure. Right Ventricle: The right ventricular size is normal. No increase in right ventricular wall thickness. Right ventricular systolic function is normal. Tricuspid regurgitation signal is inadequate for assessing PA pressure. Left Atrium: Left atrial size was normal in size. Right Atrium: Right atrial size was normal in size. Pericardium: There is no evidence of pericardial effusion. Mitral Valve: The mitral valve is normal in structure  and function. Normal mobility of the mitral valve leaflets. Mild mitral valve regurgitation. No evidence of mitral valve stenosis. Tricuspid Valve: The tricuspid valve is normal in structure. Tricuspid valve regurgitation is trivial. No evidence of tricuspid stenosis. Aortic Valve: The aortic valve is normal in structure and function. Aortic valve regurgitation is not visualized. No aortic stenosis is present. Pulmonic Valve: The pulmonic valve was normal in structure. Pulmonic valve regurgitation is not visualized. No evidence of pulmonic stenosis. Aorta: The aortic root is normal in size and structure. Venous: IVC assessment for right atrial pressure unable to be performed due to mechanical ventilation. The inferior vena cava is normal in size with greater than 50% respiratory variability, suggesting right atrial pressure of 3 mmHg. IAS/Shunts: No atrial level shunt detected by color flow Doppler.  LEFT VENTRICLE PLAX 2D  LVIDd:         4.50 cm  Diastology LVIDs:         2.80 cm  LV e' lateral:   11.30 cm/s LV PW:         1.10 cm  LV E/e' lateral: 9.5 LV IVS:        1.20 cm  LV e' medial:    9.68 cm/s LVOT diam:     2.00 cm  LV E/e' medial:  11.1 LV SV:         61 LV SV Index:   36 LVOT Area:     3.14 cm  RIGHT VENTRICLE RV Basal diam:  2.90 cm RV S prime:     13.80 cm/s TAPSE (M-mode): 3.3 cm LEFT ATRIUM             Index       RIGHT ATRIUM           Index LA diam:        4.50 cm 2.65 cm/m  RA Area:     11.90 cm LA Vol (A2C):   45.3 ml 26.65 ml/m RA Volume:   26.60 ml  15.65 ml/m LA Vol (A4C):   40.5 ml 23.83 ml/m LA Biplane Vol: 45.7 ml 26.89 ml/m  AORTIC VALVE LVOT Vmax:   84.90 cm/s LVOT Vmean:  52.600 cm/s LVOT VTI:    0.195 m  AORTA Ao Root diam: 2.60 cm MITRAL VALVE MV Area (PHT): 3.37 cm     SHUNTS MV Decel Time: 225 msec     Systemic VTI:  0.20 m MV E velocity: 107.00 cm/s  Systemic Diam: 2.00 cm MV A velocity: 66.80 cm/s MV E/A ratio:  1.60 Fransico Him MD Electronically signed by Fransico Him MD Signature Date/Time: 07/20/2019/12:37:18 PM    Final    IR PERCUTANEOUS ART THROMBECTOMY/INFUSION INTRACRANIAL INC DIAG ANGIO  Result Date: 07/20/2019 INDICATION: 73 year old female with past medical history significant for hypothyroidism presenting as a code stroke with right-sided hemiplegia and new onset atrial fibrillation. Her last known well was 21:50 on 07/19/2019. Head CT showed loss of gray-white differentiation in the left frontal for colon extending to the anterior aspect of the insula with no hemorrhage. CT angiogram showed a left M2/MCA/superior division branch occlusion. NIHSS 25. EXAM: Diagnostic cerebral angiogram Mechanical thrombectomy Flat panel head CT COMPARISON:  CT/CT angiogram of the head and neck July 19, 2019 MEDICATIONS: 5 mg of problem you have intra arterial to the left ICA. ANESTHESIA/SEDATION: General anesthesia CONTRAST:  75 mL FLUOROSCOPY TIME:  Fluoroscopy Time: 55 minutes 18 seconds  (1,796 mGy). COMPLICATIONS: Moderate left sylvian subarachnoid hemorrhage TECHNIQUE: Informed written consent was obtained  from the patient's son after a thorough discussion of the procedural risks, benefits and alternatives. All questions were addressed. Maximal Sterile Barrier Technique was utilized including caps, mask, sterile gowns, sterile gloves, sterile drape, hand hygiene and skin antiseptic. A timeout was performed prior to the initiation of the procedure. Using a micropuncture kit and modified Seldinger technique, access was gained to the right common femoral artery and an 8 French sheath was placed in the right common femoral artery. Under fluoroscopy, an 8 [place] Walrus balloon guide catheter was navigated over a 6 range Berenstein 2 catheter and a 0.035 inch Terumo Glidewire into the aortic arch. Under fluoroscopy, the catheter was advanced into the cervical right internal carotid artery. The wire and 6 French catheter were removed and angiograms of the head were obtained in frontal and lateral views. FINDINGS: There is a left M2/MCA superior division branch occlusion. Prominent tortuosity of the cervical left ICA and left carotid siphon. PROCEDURE: A large bore catheter aspiration catheter was navigated through the balloon guide catheter and over a phenom 21 microcatheter and a synchro2 support microguidewire into the cavernous segment of the left ICA. The microcatheter was then advanced into the left M2/MCA superior division branch, distal to the point of occlusion. Frontal and lateral angiograms were obtained with microcatheter contrast injection. A 4 x 40 mm solitaire stent retriever was subsequently deployed spanning the distal left M1-mid M2. The device was allowed to intercalated with the clot for 4 minutes. The microcatheter was removed. The guiding catheter balloon was inflated and constant aspiration was performed, the aspiration catheter was navigated near the occlusion and connected to a  penumbra aspiration pump. The stent retriever was then retracted along with the aspiration catheter. Frontal and lateral angiograms were obtained showing persistent left M2/MCA occlusion. In a similar fashion and using the same platform, a second stent retriever pass was performed with the solitaire device. Frontal and lateral angiograms were obtained showing persistent left M2/MCA occlusion with the clot migrating proximally. A catalyst 6 aspiration catheter was navigated through the balloon guide catheter and over a phenom 21 microcatheter and posterior 0.014 inch Aristotle microguidewire into the cavernous segment of the left ICA. The microcatheter was then advanced into the left M2/MCA superior division branch, distal to the point of occlusion. Frontal and lateral angiograms were obtained with microcatheter contrast injection. A 4 x 41 mm trevo stent retriever was subsequently deployed spanning the distal left M1-mid M2. The device was allowed to intercalated with the clot for 4 minutes. The microcatheter was removed. The guiding catheter balloon was inflated and constant aspiration was performed, the aspiration catheter was navigated near the occlusion and connected to a penumbra aspiration pump. The stent retriever was then retracted along with the aspiration catheter. Frontal and lateral angiograms were obtained showing distal contrast penetration into the left M2/MCA superior division branch with severe vasospasm. Intra arterial infusion of 5 mg of verapamil was performed over 10 minutes. Follow-up angiogram showed reocclusion of the left M2/MCA superior division branch. A 3 max aspiration catheter was navigated through the balloon guide catheter and over the Aristotle microguidewire into the left and 2/MCA superior division branch at the level of occlusion. Continuous is aspiration was performed for 4 minutes. The guiding catheter balloon was inflated and the aspiration catheter was then removed. Follow-up  left ICA angiograms with frontal and lateral views showed recanalization of the left M2/MCA superior division branch with slow flow in the distal angular branch (TICI 2C). A flat panel CT of  the head was then obtained. Moderate left sylvian subarachnoid hemorrhage was identified. The catheter was then removed. A right common femoral artery angiogram was obtained via sheath side port. A 6 French Perclose ProGlide was utilized for right, femoral access closure. Immediate hemostasis was achieved. Patient was transferred to ICU for continued monitoring. Family and neurology team communicated immediately after the end of the procedure. IMPRESSION: 1. Left M2/MCA superior division branch occlusion. 2. Successful mechanical thrombectomy/aspiration performed with TICI 2C final recanalization. A total of 3 stent retriever passes and 1 aspiration pass performed. 3. Moderate size left sylvian subarachnoid hemorrhage seen on postprocedure flat panel CT. PLAN: Continued monitoring in ICU level of care. Repeat head CT within 4 hours to evaluate for subarachnoid hemorrhage stability. Electronically Signed   By: Pedro Earls M.D.   On: 07/20/2019 09:27   CT HEAD CODE STROKE WO CONTRAST  Result Date: 07/19/2019 CLINICAL DATA:  Code stroke. Initial evaluation for right-sided facial droop with right-sided weakness. EXAM: CT HEAD WITHOUT CONTRAST TECHNIQUE: Contiguous axial images were obtained from the base of the skull through the vertex without intravenous contrast. COMPARISON:  None. FINDINGS: Brain: Mild age-related cerebral atrophy. Small focus of encephalomalacia involving the cortical/subcortical high left frontal lobe consistent with a small chronic left MCA territory infarct (series 3, image 25). Few scattered parenchymal calcifications noted at the right frontal and left parietal region. There is subtle loss of gray-white matter differentiation involving the region of the left frontal operculum,  suspicious for early/developing acute left MCA territory infarct. Left insula relatively well maintained at this time as are the deep gray nuclei. No intracranial hemorrhage or mass effect. No other acute large vessel territory infarct. No mass lesion or midline shift. No hydrocephalus. No extra-axial fluid collection. Vascular: Asymmetric hyperdensity seen M2 branch (series 6, image 45), suspicious for LV of. Of distal left M1 and/or proximal Skull: Scalp soft tissues and calvarium within normal limits. Sinuses/Orbits: Left gaze noted. Globes and orbital soft tissues otherwise unremarkable. Mild mucosal thickening noted within the right sphenoid sinus. Chronic appearing left mastoid and middle ear effusion noted. Visualized nasopharynx within normal limits. Other: None. ASPECTS [facility] Stroke Program Early CT Score) - Ganglionic level infarction (caudate, lentiform nuclei, internal capsule, insula, M1-M3 cortex): 7 - Supraganglionic infarction (M4-M6 cortex): 2 Total score (0-10 with 10 being normal): 9 IMPRESSION: 1. Subtle loss of gray-white matter differentiation involving the left frontal operculum, concerning for acute left MCA territory infarct. Hyperdensity at the level of a distal left M1/proximal M2 branch concerning for LVO. Further assessment with dedicated CTA recommended. No intracranial hemorrhage. 2. ASPECTS is 9. 3. Small chronic high left frontal infarct. Critical Value/emergent results were called by telephone at the time of interpretation on 07/19/2019 at 11:10 pm to provider ERIC [facility] , who verbally acknowledged these results. Electronically Signed   By: Jeannine Boga M.D.   On: 07/19/2019 23:35    Labs:  Basic Metabolic Panel: Recent Labs  Lab 07/28/19 1939 07/29/19 0546  NA 137  --   K 3.9  --   CL 105  --   CO2 21*  --   GLUCOSE 166*  --   BUN 23  --   CREATININE 1.05*  --   CALCIUM 8.5*  --   MG  --  2.1    CBC: No results for input(s): WBC, NEUTROABS, HGB,  HCT, MCV, PLT in the last 168 hours.  CBG: Recent Labs  Lab 08/01/19 2141 08/02/19 0609 08/02/19 1221 08/02/19  1617 08/02/19 2105  GLUCAP 131* 114* 87 94 104*   Family history.  Unknown  Brief HPI:   Jamie Mitchell is a 75 y.o. right-handed limited English speaking female with history of hypertension as well as hypothyroidism.  Per chart review lives alone in [place] was here visiting family in the area for the last 6 months.  Son in the area with excellent support.  Presented 07/19/2019 with right-sided weakness.  Cranial CT/MRI/MRA showed subtle loss of gray-white matter differentiation involving the left frontal operculum concerning for acute left MCA territory infarction.  Small chronic high left frontal infarction.  Patient did receive TPA.  CT angiogram of head and neck positive for emergent large vessel occlusion with occlusive thrombus within proximal left M2 branch superior division.  Patient underwent revascularization per interventional radiology and remained intubated for a short time through 07/23/2019.  Echocardiogram with ejection fraction of 65% without emboli.  Cardiology follow-up for bouts of atrial fibrillation and flutter maintained on amiodarone with taper as directed.  Latest MRI showed large acute left MCA infarction punctate acute left parietal and occipital infarct with residual small volume SAH.  Maintained on aspirin for CVA prophylaxis plan was to begin Eliquis in 7 to 10 days with resolution of SAH.  Dysphagia #1 thin liquid diet.  Nasogastric tube initially in place for nutritional support.  Prediabetes with hemoglobin A1c of 6.1.  Patient was admitted for a comprehensive rehab program   Hospital Course: Jamie Mitchell was admitted to rehab 07/25/2019 for inpatient therapies to consist of PT, ST and OT at least three hours five days a week. Past admission physiatrist, therapy team and rehab RN have worked together to provide customized collaborative  inpatient rehab.  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  Initially on aspirin therapy Eliquis has since been initiated after initial 7 to 10 days and patient would follow-up with neurology services.  Pain management with the use of a lidocaine patch for left shoulder.  Atrial flutter fibrillation amiodarone 200 mg daily at discharge.  She would follow-up with cardiology services.  Blood pressure controlled no orthostasis noted.  Lipitor for hyperlipidemia.  She was using melatonin as needed for sleep.  Hypothyroidism with Synthroid as advised with noted TSH level of 7.052 and recommendations follow-up outpatient thyroid panel..  Prediabetes hemoglobin A1c 6.1 sliding scale insulin diabetic diet with diabetic teaching.  She can follow-up outpatient with her primary care provider.  Her diet had been advanced to a mechanical soft thin liquid.   Blood pressures were monitored on TID basis and controlled  Diabetes has been monitored with ac/hs CBG checks and SSI was use prn for tighter BS control.   She is continent of bowel and bladder.  Jamie Mitchell has made gains during rehab stay and is attending therapies  She will continue to receive follow up therapies   after discharge  Rehab course: During patient's stay in rehab weekly team conferences were held to monitor patient's progress, set goals and discuss barriers to discharge. At admission, patient required minimal assist ambulate 85 feet rolling walker.  Minimal assist general transfers.  Minimal assist overall for ADLs  Physical exam.  Blood pressure 100/73 pulse 65 temperature 98.5 respiration 16 oxygen saturations 93% room air Constitutional well-developed well-nourished HEENT Head.  Normocephalic and atraumatic Neck.  Supple nontender no JVD without thyromegaly Eyes.  Pupils round and reactive to light  no  discharge without nystagmus Cardiac regular rate rhythm without any extra sounds or murmur heard Respiratory effort normal no respiratory distress without wheeze or rails Abdomen.  Soft nontender positive bowel sounds without rebound Musculoskeletal/neurological. Left upper extremity 5 out of 5 deltoid bicep tricep grip and finger abduction Right upper extremity attempted all muscles but only tested triceps 4 out of 5 grip 4- out of 5 Left lower extremity 5 out of 5 hip flexors knee extension knee flexion dorsi plantarflexion Right lower extremity 4 out of 5 hip flexors dorsiflexion plantarflexion Patient alert son at bedside to help translate she had a mild left gaze preference facial droop appeared globally aphasic oriented x1 to self only.  Jamie Mitchell  has had improvement in activity tolerance, balance, postural control as well as ability to compensate for deficits. Jamie Mitchell has had improvement in functional use RUE/LUE  and RLE/LLE as well as improvement in awareness.  Patient participated in ambulation on uneven surfaces and community environments.  Ambulates on patio 250 feet supervision verbal cues for decreasing to safe speed.  She was able to safely negotiate all obstacles and safely ambulated spaces with multiple distractions.  She did require cues for pacing.  Up-and-down stairs with supervision.  Gather belongings for activities day living and homemaking.  Patient required minimal assist verbal cues for use of liquid wash and slow rate.  When posed with a question to make a choice between water and juice patient verbalized [place].  Provided moderate aphonemic, semantic and written cues.  She also began to demonstrate mild verbal perseveration.  It was discussed at length with her son ongoing need for her supervision.       Disposition: Discharge to home    Diet: Mechanical soft diabetic diet  Special Instructions: No driving smoking or alcohol  Follow-up outpatient thyroid  panel  Medications at discharge. 1.  Tylenol as needed 2 amiodarone 200 mg BID 3.  Eliquis 5 mg p.o. twice daily 4.  Aspirin 81 mg p.o. daily 5.  Lipitor 80 mg daily 6.  Voltaren gel 2 g 4 times daily as needed to affected area 7.  Synthroid 0.5-one tab (50-157mg total) 50 mcg one day alternate with 75 mcg daily 8.  Lidoderm patch change as directed  Discharge Instructions    Ambulatory referral to Neurology   Complete by: As directed    An appointment is requested in approximately 4 weeks left MCA infarction   Ambulatory referral to Physical Medicine Rehab   Complete by: As directed    Moderate complexity follow up 1-2 weeks left MCA infarction      Follow-up Information    Kirsteins, ALuanna Salk MD Follow up.   Specialty: Physical Medicine and Rehabilitation Why: Office to call for appointment Contact information: [phone]       dPedro Earls MD Follow up.   Specialties: Radiology, Interventional Radiology Why: Call for appointment Contact information: [phone]        TEvans Lance MD Follow up.   Specialty: Cardiology Why: Call for appointment Contact information: [id number] [phone]        SDonato Heinz MD Follow up on 08/16/2019.   Specialty: Cardiology Why: Please [facility] [id number]          Signed: DQuillian Quince  J Ayliana Casciano 08/03/2019, 5:16 AM

## 2019-08-01 NOTE — Progress Notes (Signed)
Progress Note  Patient Name: Jamie Mitchell Date of Encounter: 08/01/2019  Primary Cardiologist: Donato Heinz, MD   Subjective   The patient is back in atrial flutter with variable block and rate controlled..  Inpatient Medications    Scheduled Meds: . amiodarone  400 mg Oral BID  . apixaban  5 mg Oral BID  . aspirin EC  81 mg Oral Daily  . atorvastatin  40 mg Oral Daily  . feeding supplement (ENSURE ENLIVE)  237 mL Oral TID BM  . insulin aspart  0-9 Units Subcutaneous TID WC & HS  . levothyroxine  37.5 mcg Oral Q0600  . lidocaine  1 patch Transdermal Q24H  . living well with diabetes book- in spanish   Does not apply Once   Continuous Infusions:  PRN Meds: acetaminophen **OR** acetaminophen (TYLENOL) oral liquid 160 mg/5 mL **OR** acetaminophen, diclofenac Sodium, Melatonin, ondansetron **OR** ondansetron (ZOFRAN) IV, senna-docusate   Vital Signs    Vitals:   07/31/19 0402 07/31/19 1428 07/31/19 2108 08/01/19 0349  BP: 116/66 104/69 116/81 (!) 128/97  Pulse: (!) 57 68 60 87  Resp: 16 (!) 24 18 18   Temp: (!) 97.5 F (36.4 C) 97.9 F (36.6 C) 98.2 F (36.8 C) (!) 97.4 F (36.3 C)  TempSrc: Oral Oral Oral Oral  SpO2: 99% 96% 93% 95%  Weight:      Height:        Intake/Output Summary (Last 24 hours) at 08/01/2019 0845 Last data filed at 07/31/2019 1800 Gross per 24 hour  Intake 360 ml  Output --  Net 360 ml   Last 3 Weights 07/25/2019 07/23/2019 07/22/2019  Weight (lbs) 155 lb 6.8 oz 156 lb 8.4 oz 157 lb 10.1 oz  Weight (kg) 70.5 kg 71 kg 71.5 kg      Telemetry    n/a - Personally Reviewed  ECG    NSR 60 bpm, PRWP - Personally Reviewed  Physical Exam  Smiling, looks comfortable lying flat GEN: No acute distress.   Neck: No JVD Cardiac: RRR, no murmurs, rubs, or gallops.  Respiratory: Clear to auscultation bilaterally. GI: Soft, nontender, non-distended  MS: No edema; No deformity. Neuro:  dense expressive aphasia; appears to  comprehend (Spanish speaking only) Psych: Normal affect   Labs    High Sensitivity Troponin:  No results for input(s): TROPONINIHS in the last 720 hours.    Chemistry Recent Labs  Lab 07/26/19 0831 07/28/19 1939  NA 138 137  K 4.0 3.9  CL 103 105  CO2 21* 21*  GLUCOSE 163* 166*  BUN 22 23  CREATININE 0.90 1.05*  CALCIUM 8.7* 8.5*  PROT 7.0  --   ALBUMIN 3.3*  --   AST 35  --   ALT 38  --   ALKPHOS 85  --   BILITOT 0.8  --   GFRNONAA >60 53*  GFRAA >60 >60  ANIONGAP 14 11     Hematology Recent Labs  Lab 07/26/19 0831  WBC 7.3  RBC 5.02  HGB 14.8  HCT 44.6  MCV 88.8  MCH 29.5  MCHC 33.2  RDW 13.4  PLT 226    BNPNo results for input(s): BNP, PROBNP in the last 168 hours.   DDimer No results for input(s): DDIMER in the last 168 hours.   Radiology    CT HEAD WO CONTRAST  Result Date: 07/31/2019 CLINICAL DATA:  Stroke, follow-up EXAM: CT HEAD WITHOUT CONTRAST TECHNIQUE: Contiguous axial images were obtained from the base of the  skull through the vertex without intravenous contrast. COMPARISON:  07/24/2019 FINDINGS: Brain: Left frontal infarction is again identified involving the middle and inferior frontal gyri. There is increased gyriform density likely reflecting petechial hemorrhage. Associated edema has decreased. Expected evolution of hemorrhage on the prior studies. There is no loss gray-white differentiation.  No hydrocephalus. Vascular: No new finding. Skull: No new finding. Sinuses/Orbits: Mild mucosal thickening.  No acute orbital finding. Other: Persistent left mastoid and middle ear opacification. IMPRESSION: Evolving left frontal infarction with decreased edema. There is probable petechial hemorrhage without discrete hematoma. No significant mass effect. Electronically Signed   By: Macy Mis M.D.   On: 07/31/2019 15:31    Cardiac Studies  ECHO 06/30/2019  1. Left ventricular ejection fraction, by estimation, is 60 to 65%. The  left ventricle has  normal function. The left ventricle has no regional  wall motion abnormalities. Left ventricular diastolic parameters were  normal.  2. Right ventricular systolic function is normal. The right ventricular  size is normal. Tricuspid regurgitation signal is inadequate for assessing  PA pressure.  3. The mitral valve is normal in structure and function. Mild mitral  valve regurgitation. No evidence of mitral stenosis.  4. The aortic valve is normal in structure and function. Aortic valve  regurgitation is not visualized. No aortic stenosis is present.  5. The inferior vena cava is normal in size with greater than 50%  respiratory variability, suggesting right atrial pressure of 3 mmHg.   Patient Profile     74 y.o. female with embolic stroke (newly diagnosed atrial flutter), s/p L MCA thrombectomy complicated by moderate SAH.  Assessment & Plan    Recurrent asymptomatic atrial flutter with RVR, rate controlled after an increase in amiodarone dose, then converted to NSR, now back in atrial flutter. I would continue the same high dose of amiodarone and daily ECGs. We will arrange for a follow up in our clinic and if she is in atrial flutter 3-4 weeks post discharge, we can arrange a cardioversion. No structural heart disease identified. Start Eliquis 5 mg BID and reduce amiodarone to 200 mg daily at DC. She plans to stay in the [place]. She has been seen at the [facility] and should be able to get Eliquis there or apply for pharmaceutical company assistance. She had Medicaid, but reportedly lost it due to the stimulus checks in her bank account. Should get a 30-day free Eliquis card at DC.    For questions or updates, please contact [place] Please consult [email or web address] for contact info under     Signed, Ena Dawley, MD  08/01/2019, 8:45 AM

## 2019-08-01 NOTE — Progress Notes (Signed)
Occupational Therapy Session Note  Patient Details  Name: Jamie Mitchell MRN: [id number] Date of Birth: [birth year]  Today's Date: 08/01/2019 OT Individual Time: 0803-0900 OT Individual Time Calculation (min): 57 min    Short Term Goals: Week 1:  OT Short Term Goal 1 (Week 1): STGs=LTGs secondary to short estimated LOS  Skilled Therapeutic Interventions/Progress Updates:    Upon entering the room, pt seated in recliner chair and having just started breakfast. Pt needing close supervision for meal with min cuing for pocketing but she did demonstrate taking small bites and sips without cuing to do so. Once finished, pt donning B shoes with supervision and ambulating to bathroom for toileting needs with supervision overall without use of AD. Pt able to manage clothing and perform hygiene with supervision for safety. Pt standing at sink for hand hygiene, to comb hair, and brush teeth with supervision overall. While brushing teeth, pt rinses mouth and has quite a bit of food in mouth from breakfast but she worked on oral care until it was all out. Pt ambulating to stand at dresser and followed commands to clean up and remove dead flowers from flower arrangement. Pt with no LOB during this activity as well. Interpreter present throughout. Pt returning to sit in recliner chair with chair alarm activated and call bell within reach.   Therapy Documentation Precautions:  Precautions Precautions: Fall Restrictions Weight Bearing Restrictions: No   Pain: Pain Assessment Pain Scale: 0-10 Pain Score: 0-No pain   Therapy/Group: Individual Therapy  Gypsy Decant 08/01/2019, 9:58 AM

## 2019-08-01 NOTE — Consult Note (Signed)
   [facility] CM Inpatient Consult   08/01/2019  Jamie Mitchell 09-17-1945 [id number]  Thank you for the  referral request from the Diabetes Coordinator of this patient with Medicare in the [place] with [place].  The patient is currently in the inpatient rehabilitation center at [facility].   Chart reviewed for referral request and patient's Hemoglobin A1C is 6.1.  Reached out to [facility] care manager Neoma Laming, regarding post hospital needs and goals.  Patient's son will likely be the best one to teach if she is being recommended for blood sugar checks.  Plan: Will follow for progress and reach out to son and interpreter as needed for disposition and support.  1615:  Spoke with patient's son, Jamie Mitchell in the patient's room by his cell phone, HIPAA verified.  [place] [facility] and explained referral request.  Son states they are to have home health to come out when she goes home.  Explained that the home health nurse could do hands on with blood glucose follow up if needed.  [name] verbalized understanding.    Primary Care Provider is: Fredia Beets, [place] at [facility].  This provider office is listed for the transition of care follow up.  Will continue to follow.  [facility] Care Management does not interfere with or replace any services needed and/or set up by the care management staff.  For questions, please contact:  Natividad Brood, RN BSN [place] [facility] Liaison  [phone] business mobile phone Toll free office [phone]  Fax number: [phone] [email or web address]

## 2019-08-01 NOTE — Progress Notes (Signed)
Physical Therapy Session Note  Patient Details  Name: Jamie Mitchell MRN: [id number] Date of Birth: May 24, 1946  Today's Date: 08/01/2019 PT Individual Time: 0630-1601 PT Individual Time Calculation (min): 70 min   Short Term Goals: Week 1:  PT Short Term Goal 1 (Week 1): STG=LTG due to ELOS  Skilled Therapeutic Interventions/Progress Updates: Pt presented in recliner agreeable to therapy. Pt denies pain during session. Pt transported to main entrance and participated in ambulation on uneven surfaces and community environments. Pt ambulated on patio bouts of 100 TO >250FT with supervision and verbal cues for decreasing to safe speed. Pt was able to safely negotiate all obstacles and safely ambulate in spaces with multiple distractions. Pt did require cues for pacing as well as to take breaks as pt stopped when PTA was able to audibly hear pt breathing. Pt returned to unit via w/c and performed ascending/descending x 1 rail x 12 with supervision. Pt also participated in ball against re-bounder standing on level tile and compliant surface. Pt participated in [place] B exercises with pt demonstrating fair technique after cues. Pt did require frequent rest breaks due to fatigue. Pt ambulated back to room no AD with supervision and transferred back to recliner. Pt left in recliner at end of session with seat alarm on, call bell within reach and needs met.      Therapy Documentation Precautions:  Precautions Precautions: Fall Restrictions Weight Bearing Restrictions: No General:   Vital Signs: Therapy Vitals Temp: 98.3 F (36.8 C) Pulse Rate: 69 Resp: 19 BP: 103/69 Patient Position (if appropriate): Lying Oxygen Therapy SpO2: 96 % O2 Device: Room Air Pain:   Mobility:   Locomotion :    Trunk/Postural Assessment :    Balance:   Exercises:   Other Treatments:      Therapy/Group: Individual Therapy  Mukesh Kornegay 08/01/2019, 4:39 PM

## 2019-08-01 NOTE — Progress Notes (Signed)
Speech Language Pathology Daily Session Note  Patient Details  Name: Jamie Mitchell MRN: [id number] Date of Birth: Aug 14, 1945  Today's Date: 08/01/2019 SLP Individual Time: 1001-1058 SLP Individual Time Calculation (min): 57 min  Short Term Goals: Week 1: SLP Short Term Goal 1 (Week 1): STG=LTG due to ELOS  Skilled Therapeutic Interventions: Pt was seen for skilled ST targeting dysphagia and speech goals. SLP facilitated session with upgraded Dys 3 (mech soft) solid snack and thin liquid. Pt required Min A verbal cues for use of liquid wash and slow rate, but did achieve full oral clearance with use of strategies. She is becoming more independent with use of liquid wash in comparison to previous visits, although will still require full supervision during meals. Recommend upgrade to Dys 3 (mech soft) solids diet, continue thin liquids.  Pt continues to demonstrate quick progress toward verbal communication goals. When posed with a question to make a choice between water and juice, pt verbalized "agua". Provided Mod A phonemic and written cues, pt verbalized object names with 80% accuracy. During attempts to extend to object names to phrase level utterances with a carrier phase, pt required Max A phonemic, semantic, and written cues. She also began to demonstrate mild verbal perseverations, however could be corrected with emphasis on written aid and verbal model. Pt returned to room and left sitting in recliner with son present and needs within reach. Shared information regarding verbal output and diet changes with son, who was pleased. Continue per current plan of care.          Pain Pain Assessment Pain Scale: 0-10 Pain Score: 0-No pain  Therapy/Group: Individual Therapy  Arbutus Leas 08/01/2019, 10:57 AM

## 2019-08-01 NOTE — Care Management (Signed)
Patient ID: Jamie Mitchell, female   DOB: 08/03/1945, 73 y.o.   MRN: [id number]  Therapy team notes patient is ready for discharge home 08/03/19. Son has been in for several sessions and received education. Will need to touch base with SLP for final education for d/c. [facility] services set up and DME ordered. Notified son of current functional status and d/c date. He stated an understanding of the information reviewed and is in agreement with the discharge date and follow up [facility] services arranged.Silvestre Mesi PA made aware of discharge. Margarito Liner

## 2019-08-01 NOTE — Plan of Care (Signed)
  Problem: Consults Goal: RH STROKE PATIENT EDUCATION Description: See Patient Education module for education specifics  Outcome: Progressing Goal: Diabetes Guidelines if Diabetic/Glucose > 140 Description: If diabetic or lab glucose is > 140 mg/dl - Initiate Diabetes/Hyperglycemia Guidelines & Document Interventions  Outcome: Progressing   Problem: RH BOWEL ELIMINATION Goal: RH STG MANAGE BOWEL WITH ASSISTANCE Description: STG Manage Bowel with Mod I Assistance. Outcome: Progressing Goal: RH STG MANAGE BOWEL W/MEDICATION W/ASSISTANCE Description: STG Manage Bowel with Medication with [place]. Outcome: Progressing   Problem: RH BLADDER ELIMINATION Goal: RH STG MANAGE BLADDER WITH ASSISTANCE Description: STG Manage Bladder With min Assistance Outcome: Progressing   Problem: RH SKIN INTEGRITY Goal: RH STG SKIN FREE OF INFECTION/BREAKDOWN Description: Skin will be free of infection/breakdown with min assistance Outcome: Progressing Goal: RH STG MAINTAIN SKIN INTEGRITY WITH ASSISTANCE Description: STG Maintain Skin Integrity With min Assistance. Outcome: Progressing   Problem: RH SAFETY Goal: RH STG ADHERE TO SAFETY PRECAUTIONS W/ASSISTANCE/DEVICE Description: STG Adhere to Safety Precautions With min Assistance/Device. Outcome: Progressing   Problem: RH COGNITION-NURSING Goal: RH STG USES MEMORY AIDS/STRATEGIES W/ASSIST TO PROBLEM SOLVE Description: STG Uses Memory Aids/Strategies With min Assistance to Problem Solve. Outcome: Progressing   Problem: RH PAIN MANAGEMENT Goal: RH STG PAIN MANAGED AT OR BELOW PT'S PAIN GOAL Description: Pain will be reported at less than 2 out of 10 with min assitance Outcome: Progressing   Problem: RH KNOWLEDGE DEFICIT Goal: RH STG INCREASE KNOWLEDGE OF DIABETES Description: Pt will verbalize proper food and exercise to decrease A1c  Outcome: Progressing Goal: RH STG INCREASE KNOWLEDGE OF HYPERTENSION Description: Pt will be able to  identify appropriate blood pressure according to AHA  Outcome: Progressing Goal: RH STG INCREASE KNOWLEDGE OF DYSPHAGIA/FLUID INTAKE Description: Pt will be able to identify dysphagia diet or appropriate diet at dischareg  Outcome: Progressing Goal: RH STG INCREASE KNOWLEGDE OF HYPERLIPIDEMIA Outcome: Progressing Goal: RH STG INCREASE KNOWLEDGE OF STROKE PROPHYLAXIS Description: Pt will be able to identify reason for stroke prophylaxis and  medication use Outcome: Progressing

## 2019-08-01 NOTE — Progress Notes (Signed)
Nutrition Follow-up  **RD working remotely**  DOCUMENTATION CODES:   Not applicable  INTERVENTION:  Continue Ensure Enlive po TID, each supplement provides 350 kcal and 20 grams of protein    NUTRITION DIAGNOSIS:   Increased nutrient needs related to other (see comment)(therapies) as evidenced by estimated needs.  Ongoing.  GOAL:   Patient will meet greater than or equal to 90% of their needs  Met.   MONITOR:   PO intake, Supplement acceptance, Weight trends, Diet advancement, Labs  REASON FOR ASSESSMENT:   Consult Enteral/tube feeding initiation and management  ASSESSMENT:   Pt with a PMH significant for HTN and hypothyroidism presented 07/19/2019 with acute left MCA infarct with SAH. Pt underwent revascularization per IR and remained intubated through 07/23/2019. Pt admitted to CIR on 07/25/19.  2/26 Cortrak placed (gastric) 3/1 MBS, DYS1/Thins 3/2 Cortrak removed 3/5 DYS2/Thins diet ordered 3/9 DYS3/Thins diet ordered  RD unable to reach pt via phone.  PO Intake: 70-100% x last 8 recorded meals (93% average intake)  Medications reviewed and include: Ensure Enlive TID, SSI  Labs reviewed. CBGs 90-153   Diet Order:   Diet Order            DIET DYS 3 Room service appropriate? Yes; Fluid consistency: Thin  Diet effective now              EDUCATION NEEDS:   No education needs have been identified at this time  Skin:  Skin Assessment: Reviewed RN Assessment  Last BM:  07/31/19  Height:   Ht Readings from Last 1 Encounters:  07/25/19 5' 2" (1.575 m)    Weight:   Wt Readings from Last 1 Encounters:  07/25/19 70.5 kg   BMI:  Body mass index is 28.43 kg/m.  Estimated Nutritional Needs:   Kcal:  1600-1800  Protein:  85-100 grams  Fluid:  >1.6L/d   Amanda Averett, MS, RD, LDN RD pager number and weekend/on-call pager number located in Amion.  

## 2019-08-02 ENCOUNTER — Inpatient Hospital Stay (HOSPITAL_COMMUNITY): Payer: Medicare Other | Admitting: Occupational Therapy

## 2019-08-02 ENCOUNTER — Inpatient Hospital Stay (HOSPITAL_COMMUNITY): Payer: Medicare Other | Admitting: Physical Therapy

## 2019-08-02 ENCOUNTER — Inpatient Hospital Stay (HOSPITAL_COMMUNITY): Payer: Medicare Other | Admitting: Speech Pathology

## 2019-08-02 LAB — GLUCOSE, CAPILLARY
Glucose-Capillary: 114 mg/dL — ABNORMAL HIGH (ref 70–99)
Glucose-Capillary: 87 mg/dL (ref 70–99)
Glucose-Capillary: 94 mg/dL (ref 70–99)
Glucose-Capillary: 104 mg/dL — ABNORMAL HIGH (ref 70–99)

## 2019-08-02 MED ORDER — AMIODARONE HCL 200 MG PO TABS: 200.0000 mg | ORAL_TABLET | Freq: Two times a day (BID) | ORAL | 0 refills | Status: DC

## 2019-08-02 MED ORDER — LEVOTHYROXINE SODIUM 100 MCG PO TABS: 50.0000 ug | ORAL_TABLET | ORAL | 0 refills | Status: DC

## 2019-08-02 MED ORDER — DICLOFENAC SODIUM 1 % EX GEL: 2.0000 g | Freq: Four times a day (QID) | CUTANEOUS | 0 refills | Status: DC | PRN

## 2019-08-02 MED ORDER — APIXABAN 5 MG PO TABS: 5.0000 mg | ORAL_TABLET | Freq: Two times a day (BID) | ORAL | 1 refills | Status: DC

## 2019-08-02 MED ORDER — LIDOCAINE 5 % EX PTCH: 1.0000 | MEDICATED_PATCH | CUTANEOUS | 0 refills | Status: DC

## 2019-08-02 MED ORDER — ACETAMINOPHEN 325 MG PO TABS: 650.0000 mg | ORAL_TABLET | ORAL | Status: DC | PRN

## 2019-08-02 MED ORDER — ATORVASTATIN CALCIUM 40 MG PO TABS: 40.0000 mg | ORAL_TABLET | Freq: Every day | ORAL | 0 refills | Status: DC

## 2019-08-02 NOTE — Progress Notes (Signed)
Physical Therapy Session Note  Patient Details  Name: Jamie Mitchell MRN: [id number] Date of Birth: 13-Sep-1945  Today's Date: 08/02/2019 PT Individual Time: 1035-1130 PT Individual Time Calculation (min): 55 min   Short Term Goals: Week 1:  PT Short Term Goal 1 (Week 1): STG=LTG due to ELOS  Skilled Therapeutic Interventions/Progress Updates: Pt presented in recliner with interpreter present throughout session agreeable to therapy. Pt ambulated to ortho gym no AD mod I. Pt participated in car transfer, ramp, and ambulation on mulch all performed mod I. Pt ambulated to rehab gym and participated in ascending/descending stairs x 12 with B rail and distant supervision. Pt also ambulated to ADL apt and participated in bed mobility and furniture transfers mod I (requiring increased time). Participated in therex including squats with shoudlder flexion with 1Kg weighted ball and PNF diagonals D1/D2 with 1Kg weighted balls. Pt ambulated to Day room and participated in NuStep L3 x 2 min L2 x 6 min for general conditionoing. Pt ambulated back to room at end of session and returned to recliner. Pt left with chair alarm on, call bell within reach and needs met.      Therapy Documentation Precautions:  Precautions Precautions: Fall Restrictions Weight Bearing Restrictions: No General:   Vital Signs:  Pain: Pain Assessment Pain Scale: 0-10 Pain Score: 0-No pain    Therapy/Group: Individual Therapy  Kuper Rennels  Rosabel Sermeno, PTA  08/02/2019, 2:39 PM

## 2019-08-02 NOTE — Progress Notes (Signed)
The patient is back in SR, continue current management, decrease amiodarone to 200 mg PO BID at the discharge.  We will arrange for an outpatient follow up. We will sign off, please call us with any questions.   Ena Dawley, MD 08/02/2019

## 2019-08-02 NOTE — Progress Notes (Signed)
Occupational Therapy Discharge Summary  Patient Details  Name: Jamie Mitchell MRN: [id number] Date of Birth: [birth year]  Today's Date: 08/02/2019 OT Individual Time: 1300-1410 OT Individual Time Calculation (min): 70 min    Patient has met 11 of 11 long term goals due to improved activity tolerance, improved balance, ability to compensate for deficits, improved awareness and improved coordination.  Patient to discharge at overall Supervision level.  Patient's care partner is independent to provide the necessary cognitive assistance at discharge.    Reasons goals not met: all goals met  Recommendation:  Patient will benefit from ongoing skilled OT services in home health setting to continue to advance functional skills in the area of BADL and iADL.  Equipment: TTB  Reasons for discharge: treatment goals met  Patient/family agrees with progress made and goals achieved: Yes   OT Intervention: Upon entering the room, pt seated in recliner chair with interpreter and son , [name], present for continued family education. Pt donning B shoes with supervision and ambulating to bathroom with supervision for toileting needs. Pt ambulating with son to ADL apartment and demonstrated transfer onto TTB for tub shower combination at home. Son agreed that this was much safer and OT continued education on safety within bathroom at discharge. Pt returning back to room in same manner. Pt obtaining clothing items from dresser with supervision and taking clothing items into bathroom to don after shower. OT educated caregiver that pt would need supervision when getting into and out of shower for safety. Pt bathing at shower level while seated on TTB with supervision overall. Pt transferred to commode to don clothing items with min gesture cues for safety awareness. Pt exiting the bathroom and returning to recliner chair to dress. OT continued education and answered questions for caregiver regarding HHOT  recommendation and expectations. OT also provided caregiver with exercise handout to be utilized with yellow resistive theraputty for coordination and strengthening. All needs within reach. No further questions at this time.  OT Discharge Precautions/Restrictions  Precautions Precautions: Fall Restrictions Weight Bearing Restrictions: No Vital Signs Therapy Vitals Temp: 98.2 F (36.8 C) Temp Source: Oral Pulse Rate: 63 Resp: 20 BP: 110/74 Patient Position (if appropriate): Lying Oxygen Therapy SpO2: 96 % O2 Device: Room Air Pain Pain Assessment Pain Scale: 0-10 Pain Score: 0-No pain Vision Baseline Vision/History: Wears glasses Wears Glasses: At all times Patient Visual Report: No change from baseline Cognition Overall Cognitive Status: Impaired/Different from baseline Arousal/Alertness: Awake/alert Orientation Level: Oriented X4 Attention: Sustained Sustained Attention: Appears intact Memory: (difficult to assess secondary to aphasia) Awareness: Impaired Awareness Impairment: Emergent impairment Problem Solving: Impaired Problem Solving Impairment: Functional basic Executive Function: Reasoning;Decision Making;Initiating Reasoning: Impaired Reasoning Impairment: Functional basic Decision Making: Impaired Decision Making Impairment: Functional basic Initiating: Impaired Initiating Impairment: Functional basic Behaviors: Impulsive Safety/Judgment: Impaired Sensation Sensation Light Touch: Appears Intact Hot/Cold: Appears Intact Proprioception: Appears Intact Stereognosis: Appears Intact Coordination Gross Motor Movements are Fluid and Coordinated: Yes Fine Motor Movements are Fluid and Coordinated: Yes Mobility  Bed Mobility Bed Mobility: Rolling Right;Rolling Left;Supine to Sit;Sit to Sidelying Right Rolling Right: Independent Rolling Left: Independent Supine to Sit: Independent Sit to Sidelying Right: Independent Transfers Sit to Stand: Independent  with assistive device Stand to Sit: Independent with assistive device  Trunk/Postural Assessment  Cervical Assessment Cervical Assessment: Within Functional Limits Thoracic Assessment Thoracic Assessment: Within Functional Limits Lumbar Assessment Lumbar Assessment: Within Functional Limits Postural Control Postural Control: Within Functional Limits  Balance Balance Balance Assessed: Yes Static Sitting Balance Static Sitting -  Balance Support: No upper extremity supported Static Sitting - Level of Assistance: 6: Modified independent (Device/Increase time) Dynamic Sitting Balance Dynamic Sitting - Balance Support: No upper extremity supported Dynamic Sitting - Level of Assistance: 6: Modified independent (Device/Increase time) Dynamic Sitting - Balance Activities: Reaching for weighted objects;Reaching across midline;Trunk control activities Static Standing Balance Static Standing - Balance Support: No upper extremity supported Static Standing - Level of Assistance: 6: Modified independent (Device/Increase time) Dynamic Standing Balance Dynamic Standing - Balance Support: No upper extremity supported Dynamic Standing - Level of Assistance: 6: Modified independent (Device/Increase time) Extremity/Trunk Assessment RUE Assessment RUE Assessment: Within Functional Limits LUE Assessment LUE Assessment: Within Functional Limits   Gypsy Decant 08/02/2019, 4:11 PM

## 2019-08-02 NOTE — Progress Notes (Signed)
Jamie Mitchell PHYSICAL MEDICINE & REHABILITATION PROGRESS NOTE   Subjective/Complaints: No issues  ROS: Limited due to aphasia.  Objective:   CT HEAD WO CONTRAST  Result Date: 07/31/2019 CLINICAL DATA:  Stroke, follow-up EXAM: CT HEAD WITHOUT CONTRAST TECHNIQUE: Contiguous axial images were obtained from the base of the skull through the vertex without intravenous contrast. COMPARISON:  07/24/2019 FINDINGS: Brain: Left frontal infarction is again identified involving the middle and inferior frontal gyri. There is increased gyriform density likely reflecting petechial hemorrhage. Associated edema has decreased. Expected evolution of hemorrhage on the prior studies. There is no loss gray-white differentiation.  No hydrocephalus. Vascular: No new finding. Skull: No new finding. Sinuses/Orbits: Mild mucosal thickening.  No acute orbital finding. Other: Persistent left mastoid and middle ear opacification. IMPRESSION: Evolving left frontal infarction with decreased edema. There is probable petechial hemorrhage without discrete hematoma. No significant mass effect. Electronically Signed   By: Macy Mis M.D.   On: 07/31/2019 15:31   No results for input(s): WBC, HGB, HCT, PLT in the last 72 hours. No results for input(s): NA, K, CL, CO2, GLUCOSE, BUN, CREATININE, CALCIUM in the last 72 hours.  Intake/Output Summary (Last 24 hours) at 08/02/2019 1121 Last data filed at 08/02/2019 0900 Gross per 24 hour  Intake 590 ml  Output --  Net 590 ml     Physical Exam: Vital Signs Blood pressure 107/79, pulse (!) 58, temperature 97.6 F (36.4 C), temperature source Oral, resp. rate 16, height 5' 2"  (1.575 m), weight 70.5 kg, SpO2 97 %.  Nursing note and vitals reviewed. Constitutional: She appears well-developed and well-nourished. Appears comfortable.    General: No acute distress Mood and affect are appropriate Heart: Regular rate and rhythm no rubs murmurs or extra sounds Lungs: Clear to  auscultation, breathing unlabored, no rales or wheezes Abdomen: Positive bowel sounds, soft nontender to palpation, nondistended Extremities: No clubbing, cyanosis, or edema Skin: No evidence of breakdown, no evidence of rash  Neurologic: Cranial nerves II through XII intact, motor strength is 5/5 in Left 4/5 Right deltoid, bicep, tricep, grip, hip flexor, knee extensors, ankle dorsiflexor and plantar flexor Apahsic uses head nods and hand gestures for Y/N response with interpreter  Musculoskeletal: Full range of motion in all 4 extremities. No joint swelling    Assessment/Plan: 1. Functional deficits secondary to L MCA infarct which require 3+ hours per day of interdisciplinary therapy in a comprehensive inpatient rehab setting.  Physiatrist is providing close team supervision and 24 hour management of active medical problems listed below.  Physiatrist and rehab team continue to assess barriers to discharge/monitor patient progress toward functional and medical goals  Care Tool:  Bathing  Bathing activity did not occur: Safety/medical concerns Body parts bathed by patient: Right arm, Left arm, Chest, Abdomen, Front perineal area, Buttocks, Right upper leg, Left upper leg, Right lower leg, Left lower leg, Face         Bathing assist Assist Level: Supervision/Verbal cueing     Upper Body Dressing/Undressing Upper body dressing   What is the patient wearing?: Pull over shirt    Upper body assist Assist Level: Supervision/Verbal cueing(standing)    Lower Body Dressing/Undressing Lower body dressing      What is the patient wearing?: Underwear/pull up, Pants     Lower body assist Assist for lower body dressing: Supervision/Verbal cueing     Toileting Toileting Toileting Activity did not occur (Clothing management and hygiene only): Refused  Toileting assist Assist for toileting: Supervision/Verbal cueing  Transfers Chair/bed transfer  Transfers assist      Chair/bed transfer assist level: Independent with assistive device     Locomotion Ambulation   Ambulation assist      Assist level: Supervision/Verbal cueing Assistive device: No Device Max distance: 343f   Walk 10 feet activity   Assist     Assist level: Supervision/Verbal cueing Assistive device: No Device   Walk 50 feet activity   Assist    Assist level: Supervision/Verbal cueing Assistive device: No Device    Walk 150 feet activity   Assist Walk 150 feet activity did not occur: Safety/medical concerns  Assist level: Supervision/Verbal cueing Assistive device: No Device    Walk 10 feet on uneven surface  activity   Assist     Assist level: Supervision/Verbal cueing Assistive device: Other (comment)(no AD)   Wheelchair     Assist Will patient use wheelchair at discharge?: No Type of Wheelchair: Manual    Wheelchair assist level: Minimal Assistance - Patient > 75% Max wheelchair distance: 150    Wheelchair 50 feet with 2 turns activity    Assist        Assist Level: Minimal Assistance - Patient > 75%   Wheelchair 150 feet activity     Assist      Assist Level: Minimal Assistance - Patient > 75%   Blood pressure 107/79, pulse (!) 58, temperature 97.6 F (36.4 C), temperature source Oral, resp. rate 16, height 5' 2"  (1.575 m), weight 70.5 kg, SpO2 97 %.   Medical Problem List and Plan: 1.  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 Formulized individual treatment plan based on medical history, underlying problem and comorbidities. Plan D/C in am    2.  Antithrombotics: -DVT/anticoagulation: SCDs- until OK'd  by Neurology.              -antiplatelet therapy: Aspirin 81 mg daily 3. Pain Management: Tylenol as needed. Started Lidocaine patch for left shoulder.   3/4: Ordered diclofenac gel for left shoulder pain.   3/6: pain is well controlled 4. Mood: Advised emotional support             -antipsychotic agents: N/A 5. Neuropsych: This patient is not capable of making decisions on her own behalf due to aphasia. 6. Skin/Wound Care: Routine skin checks. May DC midline.  7. Fluids/Electrolytes/Nutrition: Routine in and outs with follow-up chemistries 8.  Atrial fibrillation and flutter.  Amiodarone 200 mg twice daily x1 month then decrease to daily.  Rate controlled   Reduce amiodarone to 2038mdaily at DC. Eliquis 50m67mID restarted.  9.  Permissive hypertension.  Monitor with increased mobility.  Patient on HCTZ 25 mg daily prior to admission. Vitals:   08/01/19 2008 08/02/19 0450  BP: 93/63 107/79  Pulse: 85 (!) 58  Resp: 16 16  Temp: 98.3 F (36.8 C) 97.6 F (36.4 C)  SpO2: 96% 97%  Controlled 3/10 10.  Dysphagia.  Dysphagia #2 thin liquid diet.  ? Upgrade to D3 prior to D/C 11.  Hypothyroidism.  TSH level 6.781- 7.052.  Free T4 normal 0.80.  Synthroid 37.5 mg daily -suggest increase in dose since this was home dose.  12.  Hyperlipidemia.  Lipitor 13.  Prediabetes.  Hemoglobin A1c 6.1.  SSI. Will discuss further with son who has questions given first time on SSI     LOS: 8 days A FACE TO [place] E Corgan Mormile 08/02/2019, 11:21 AM

## 2019-08-02 NOTE — Patient Care Conference (Signed)
Inpatient RehabilitationTeam Conference and Plan of Care Update Date: 08/02/2019   Time: 10:10 AM   Patient Name: Jamie Mitchell      Medical Record Number: [id number]  Date of Birth: Aug 21, 1945 Sex: Female         Room/Bed: 4W19C/4W19C-01 Payor Info: Payor: MEDICARE / Plan: MEDICARE PART A AND B / Product Type: *No Product type* /    Admit Date/Time:  07/25/2019  3:02 PM  Primary Diagnosis:  Left middle cerebral artery stroke [facility])  Patient Active Problem List   Diagnosis Date Noted  . Renal failure 07/25/2019  . Prediabetes 07/25/2019  . Left middle cerebral artery stroke ([place]) 07/25/2019  . 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 Dysphagia 07/25/2019  . 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 Acute respiratory failure ([place])   . Tachycardia-bradycardia syndrome ([place])   . Atrial fibrillation with RVR ([place])   . Hypertension 07/23/2015  . Hyperlipidemia 07/23/2015  . 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 Hypothyroidism 01/25/2014    Expected Discharge Date: Expected Discharge Date: 08/03/19  Team Members Present: Physician leading conference: Dr. Alysia Penna Care Coodinator Present: Nestor Lewandowsky, RN, BSN, CRRN;Genie Estiben Mizuno, RN, MSN Nurse Present: Judee Clara, LPN PT Present: Barrie Folk, PT OT Present: Darleen Crocker, OT SLP Present: Jettie Booze, CF-SLP PPS Coordinator present : Gunnar Fusi, SLP     Current Status/Progress Goal Weekly Team Focus  Bowel/Bladder   Patient is continent of bowel and bladder  maintain cotinence of bowel and bladder  Assist to bathroom as needed, continued continence   Swallow/Nutrition/ Hydration   Dys 3, thin, Min A clearance right buccal pocketing  Min A  tolearnce current diet, carryover swallow strategies   ADL's   Supervision assist for BADL tasks completed at shower level, Supervision for  ambulatory bathroom transfers without device  Supervision overall  D/c planning, NMR, higher level balance   Mobility   supervision transfers, gait in controlled and outside environements without AD  Mod I gait supervision assist stairs. No AD  family ed, endurance, higher level balance d/c planning   Communication   Mod A comprehension, Mod-Max A verbalizing word and phrase level  Mod-Max  verbalizing, identifying objects, following basic directions, finish education   Safety/Cognition/ Behavioral Observations            Pain   Patient denying pain most often, has occasional headaches, PRN tylenol, lido patch to L shoulder  Keep tolerable pain level  Monitor pain and utilize PRN medications   Skin   No major skin issues, abrasion to back, scattered bruising.  No further skin breakdown  Monitor skin, promote activity to prevent further skin breakdown.    Rehab Goals Patient on target to meet rehab goals: Yes *See Care Plan and progress notes for long and short-term goals.     Barriers to Discharge  Current Status/Progress Possible Resolutions Date Resolved   [place] home environment;Incontinence Mobile home with steps to entry  Discharge Planning/Teaching Needs:  Home to son's home with help from spouse and daughter in law  Transfers, toileting, medications, DM management etc.   Team Discussion: Aphasia, has spanish interpreter, BP controlled/on the low side.  RN cont B.B, BM 3/8, talks well with son.  OT goals S, is at S level with no device.  PT on target, distant S/mod I amb, mod I transfers, went outside, distant S for stairs.  SLP exp/rec aphasia, does better with son with communication, goal level mod/max A.  Swallowing D3thins, pocketing R side, needs cues to clear.   Revisions to Treatment Plan: N/A     Medical Summary Current Status: Global aphasia but mobility  is good, BP controlled Weekly Focus/Goal: D/C planning  Barriers to Discharge: Medical stability   Possible Resolutions to Barriers: caregiver training, plan d/c in am no equipment   Continued Need for Acute Rehabilitation Level of Care: The patient requires daily medical management by a physician with specialized training in physical medicine and rehabilitation for the following reasons: Direction of a multidisciplinary physical rehabilitation program to maximize functional independence : Yes Medical management of patient stability for increased activity during participation in an intensive rehabilitation regime.: Yes Analysis of laboratory values and/or radiology reports with any subsequent need for medication adjustment and/or medical intervention. : Yes   I attest that I was present, lead the team conference, and concur with the assessment and plan of the team.   Retta Diones 08/03/2019, 11:04 AM   Team conference was held via web/ teleconference due to [place] - 19

## 2019-08-02 NOTE — Progress Notes (Signed)
Speech Language Pathology Discharge Summary  Patient Details  Name: Jamie Mitchell MRN: [id number] Date of Birth: August 23, 1945  Today's Date: 08/02/2019 SLP Individual Time: 0803-0900 SLP Individual Time Calculation (min): 57 min   Skilled Therapeutic Interventions:  Pt was seen for skilled ST targeting dysphagia and speech goals. Pt required Min A verbal and visual cues for use of liquid wash and lingual sweep for clearance of right buccal pocketing during consumption of Dys 3 (mech soft) breakfast items. No overt s/sx aspiration observed throughout intake of liquids or solids. Recommend continue current diet. During speech tasks targeting expressive, receptive communication, pt verbalized object names with 80% accuracy when provided with Max A phonemic and Mod A written cues. Need for written cues decreased today in comparison to yesterday. Education with pt's son has been ongoing, as he has been present during nearly every ST session. However, ST called son via phone today to reinforce strategies for communication and swallowing, and to review current Dys 3 diet textures. Also made son aware ST would leave handouts in pt's room that reiterate strategies for communication and diet recommendations that have already been discussed. [name] did not have any further questions at this time. Pt left sitting in recliner with seat alarm engaged and needs met, call bell at side. Continue per current plan of care.       Patient has met 6 of 6 long term goals.  Patient to discharge at overall Min;Mod;Max level.  Reasons goals not met: n/a   Clinical Impression/Discharge Summary:   Pt made excellent, swift progress toward dysphagia and communication goals; she met 6 out of 6 long term goals this admission. 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 Due to very short length of stay her cognitive deficits were not directly targeted in ST this admission (as communication and dysphagia goals took priority for tx). Pt is consuming upgraded Dysphagia 3 (mechanical soft) diet with thin liquids with Min A for use of swallow strategies, specifically slow rate and use of liquid wash to assist with oral clearance of solids. Pt has demonstrated significantly improved expressive and receptive language skills; on day of evaluation she could only imitate vowel sounds, and she is now able to verbalize at the word and short phrase levels with Mod-Max A written and phonemic cues. Comprehension of auditory information and accuracy in yes/no answers has also improved. However, given aphasia, apraxia, oral dysphagia deficits, and cognitive impairments with functional impact still present, recommend pt continue to receive skilled ST services upon discharge in addition to 24/7 supervision. Pt and family education is complete at this time.   Care Partner:  Caregiver Able to Provide Assistance: Yes  Type of Caregiver Assistance: Cognitive  Recommendation:  Outpatient SLP;24 hour supervision/assistance  Rationale for SLP Follow Up: Maximize functional communication;Maximize swallowing safety;Maximize cognitive function and independence;Reduce caregiver burden   Equipment: none   Reasons for discharge: Discharged from hospital   Patient/Family Agrees with Progress Made and Goals Achieved: Yes    Jamie Mitchell 08/02/2019, 7:15 AM

## 2019-08-02 NOTE — Progress Notes (Signed)
Physical Therapy Discharge Summary  Patient Details  Name: Jamie Mitchell MRN: [id number] Date of Birth: 1946-04-22  Today's Date: 08/02/2019    Patient has met 10 of 10 long term goals due to improved activity tolerance, improved balance, improved postural control, improved attention, improved awareness and improved coordination.  Patient to discharge at an ambulatory level Modified Independent. Pt demonstrated overall good functional mobility however at times requires verbal cues for direction (turns, stopping due to cognition). Son has been present during several sessions and demonstrates appropriate understanding of pt's level of function. Pt also participated in fall recovery with good demonstration. Patient's care partner is independent to provide the necessary physical and cognitive assistance at discharge.  Reasons goals not met: N/A all goals met  Recommendation:  Patient will benefit from ongoing skilled PT services in home health setting to continue to advance safe functional mobility, address ongoing impairments in balance, awareness, safety, gait, and minimize fall risk.  Equipment: No equipment provided  Reasons for discharge: treatment goals met  Patient/family agrees with progress made and goals achieved: Yes  PT Discharge Precautions/Restrictions Precautions Precautions: Fall Restrictions Weight Bearing Restrictions: No Vital Signs   Vitals:  08/02/19 0450  107/79  (!) 58  16  97.6 F (36.4 C)  97%    Pain Pain Assessment Pain Scale: 0-10 Pain Score: 0-No pain  Cognition Overall Cognitive Status: Impaired/Different from baseline Arousal/Alertness: Awake/alert Sensation Sensation Light Touch: Appears Intact Motor  Motor Motor: Within Functional Limits  Mobility Bed Mobility Bed Mobility: Rolling Right;Rolling Left;Supine to Sit;Sit to Sidelying Right Rolling Right: Independent Rolling Left: Independent Supine to Sit:  Independent Sit to Sidelying Right: Independent Transfers Transfers: Stand to Sit Sit to Stand: Independent with assistive device Stand to Sit: Independent with assistive device Locomotion  Gait Ambulation: Yes Gait Assistance: Independent Gait Distance (Feet): 200 Feet Assistive device: None Gait Gait: Yes Gait Pattern: Within Functional Limits Stairs / Additional Locomotion Stairs: Yes Stairs Assistance: Supervision/Verbal cueing Stair Management Technique: One rail Left(alternating L/R rails ascending & descending) Number of Stairs: 12 Height of Stairs: 6 Ramp: Independent Curb: Independent Wheelchair Mobility Wheelchair Mobility: No  Trunk/Postural Assessment  Cervical Assessment Cervical Assessment: Within Functional Limits Thoracic Assessment Thoracic Assessment: Within Functional Limits Lumbar Assessment Lumbar Assessment: Within Functional Limits Postural Control Postural Control: Within Functional Limits  Balance Balance Balance Assessed: Yes Standardized Balance Assessment Standardized Balance Assessment: Timed Up and Go Test Timed Up and Go Test TUG: Normal TUG Normal TUG (seconds): 12.3(14, 11, 12) Static Sitting Balance Static Sitting - Balance Support: No upper extremity supported Static Sitting - Level of Assistance: 6: Modified independent (Device/Increase time) Dynamic Sitting Balance Dynamic Sitting - Balance Support: No upper extremity supported Dynamic Sitting - Level of Assistance: 6: Modified independent (Device/Increase time) Dynamic Sitting - Balance Activities: Reaching for weighted objects;Reaching across midline;Trunk control activities Static Standing Balance Static Standing - Balance Support: No upper extremity supported Static Standing - Level of Assistance: 6: Modified independent (Device/Increase time) Dynamic Standing Balance Dynamic Standing - Balance Support: No upper extremity supported Dynamic Standing - Level of Assistance:  6: Modified independent (Device/Increase time) Dynamic Standing - Balance Activities: [employer];Reaching for weighted objects Extremity Assessment      RLE Assessment RLE Assessment: Within Functional Limits LLE Assessment LLE Assessment: Within Functional Limits    Rosita DeChalus 08/02/2019, 11:24 AM

## 2019-08-03 LAB — GLUCOSE, CAPILLARY: Glucose-Capillary: 144 mg/dL — ABNORMAL HIGH (ref 70–99)

## 2019-08-03 NOTE — Progress Notes (Signed)
Patient discharged home with family, all belongings sent with patient. No complications noted at this time. Audie Clear, LPN

## 2019-08-03 NOTE — Consult Note (Signed)
   [facility] CM Inpatient Consult   08/03/2019  Jamie Mitchell 28-Jun-1945 [id number]   Received a call from patient's son, [name], [phone], HIPAA verified, he states he is at the pharmacy and doesn't have the patient's insurance card and her medications will be $1000.00.  Encouraged him to contact the inpatient Rehab physician/PA or [place] to see if the information could be faxed to the pharmacy.  Will make a referral for post hospital follow up with a [employer] manager to follow up with transition. Will also reach out to inpatient Rehab PA for assistance.  Natividad Brood, RN BSN [facility] Liaison  [phone] business mobile phone Toll free office [phone]  Fax number: [phone] [name]

## 2019-08-03 NOTE — Progress Notes (Addendum)
[facility] PHYSICAL MEDICINE & REHABILITATION PROGRESS NOTE   Subjective/Complaints:  Pt is smiling  ROS: Limited due to aphasia.  Objective:   No results found. No results for input(s): WBC, HGB, HCT, PLT in the last 72 hours. No results for input(s): NA, K, CL, CO2, GLUCOSE, BUN, CREATININE, CALCIUM in the last 72 hours.  Intake/Output Summary (Last 24 hours) at 08/03/2019 0833 Last data filed at 08/03/2019 0750 Gross per 24 hour  Intake 1000 ml  Output --  Net 1000 ml     Physical Exam: Vital Signs Blood pressure 109/67, pulse (!) 54, temperature 98.6 F (37 C), temperature source Oral, resp. rate 16, height 5\' 2"  (1.575 m), weight 70.5 kg, SpO2 95 %.  Nursing note and vitals reviewed. Constitutional: She appears well-developed and well-nourished. Appears comfortable.    General: No acute distress Mood and affect are appropriate Heart: Regular rate and rhythm no rubs murmurs or extra sounds Lungs: Clear to auscultation, breathing unlabored, no rales or wheezes Abdomen: Positive bowel sounds, soft nontender to palpation, nondistended Extremities: No clubbing, cyanosis, or edema Skin: No evidence of breakdown, no evidence of rash  Neurologic: Cranial nerves II through XII intact, motor strength is 5/5 in Left 4/5 Right deltoid, bicep, tricep, grip, hip flexor, knee extensors, ankle dorsiflexor and plantar flexor Apahsic uses head nods and hand gestures for Y/N response with interpreter  Musculoskeletal: Full range of motion in all 4 extremities. No joint swelling    Assessment/Plan: 1. Functional deficits secondary to L MCA infarct  Stable for D/C today F/u PCP in 3-4 weeks F/u Neuro  No PM&R follow up needed   See D/C summary See D/C instructions Care Tool:  Bathing  Bathing activity did not occur: Safety/medical concerns Body parts bathed by patient: Right arm, Left arm, Chest, Abdomen, Front perineal area, Buttocks, Right upper leg, Left upper leg, Right  lower leg, Left lower leg, Face         Bathing assist Assist Level: Supervision/Verbal cueing     Upper Body Dressing/Undressing Upper body dressing   What is the patient wearing?: Pull over shirt    Upper body assist Assist Level: Supervision/Verbal cueing    Lower Body Dressing/Undressing Lower body dressing      What is the patient wearing?: Underwear/pull up, Pants     Lower body assist Assist for lower body dressing: Supervision/Verbal cueing     Toileting Toileting Toileting Activity did not occur (Clothing management and hygiene only): Refused  Toileting assist Assist for toileting: Supervision/Verbal cueing     Transfers Chair/bed transfer  Transfers assist     Chair/bed transfer assist level: Independent with assistive device     Locomotion Ambulation   Ambulation assist      Assist level: Independent Assistive device: No Device Max distance: 282ft   Walk 10 feet activity   Assist     Assist level: Independent Assistive device: No Device   Walk 50 feet activity   Assist    Assist level: Independent Assistive device: No Device    Walk 150 feet activity   Assist Walk 150 feet activity did not occur: Safety/medical concerns  Assist level: Independent Assistive device: No Device    Walk 10 feet on uneven surface  activity   Assist     Assist level: Supervision/Verbal cueing Assistive device: Other (comment)(no AD)   Wheelchair     Assist Will patient use wheelchair at discharge?: No Type of Wheelchair: Manual    Wheelchair assist level: Minimal Assistance -  Patient > 75% Max wheelchair distance: 150    Wheelchair 50 feet with 2 turns activity    Assist        Assist Level: Minimal Assistance - Patient > 75%   Wheelchair 150 feet activity     Assist      Assist Level: Minimal Assistance - Patient > 75%   Blood pressure 109/67, pulse (!) 54, temperature 98.6 F (37 C), temperature source  Oral, resp. rate 16, height 5\' 2"  (1.575 m), weight 70.5 kg, SpO2 95 %.   Medical Problem List and Plan: 1.  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  Antithrombotics: -DVT/anticoagulation: SCDs- until OK'd by Neurology.              -antiplatelet therapy: Aspirin 81 mg daily 3. Pain Management: Tylenol as needed. Started Lidocaine patch for left shoulder.   3/4: Ordered diclofenac gel for left shoulder pain.   3/6: pain is well controlled 4. Mood: Advised emotional support             -antipsychotic agents: N/A 5. Neuropsych: This patient is not capable of making decisions on her own behalf due to aphasia. 6. Skin/Wound Care: Routine skin checks. May DC midline.  7. Fluids/Electrolytes/Nutrition: Routine in and outs with follow-up chemistries 8.  Atrial fibrillation and flutter.  Amiodarone 200 mg twice daily x1 month then decrease to daily.  Rate controlled   Reduce amiodarone to 200mg  daily at DC. Eliquis 5mg  BID restarted.  9.  Permissive hypertension.  Monitor with increased mobility.  Patient on HCTZ 25 mg daily prior to admission. Vitals:   08/02/19 2046 08/03/19 0454  BP: 104/60 109/67  Pulse: 60 (!) 54  Resp: 16 16  Temp: (!) 97.3 F (36.3 C) 98.6 F (37 C)  SpO2: 96% 95%  Controlled 3/11 10.  Dysphagia.  Dysphagia #2 thin liquid diet.  ? Upgrade to D3 prior to D/C 11.  Hypothyroidism.  TSH level 6.781- 7.052.  Free T4 normal 0.80.  Synthroid 37.5 mg daily -suggest increase in dose since this was home dose.  12.  Hyperlipidemia.  Lipitor 13.  Prediabetes.  Hemoglobin A1c 6.1.  SSI. Will discuss further with son who has questions given first time on SSI     LOS: 9 days A FACE TO Porter E Davonta Stroot 08/03/2019, 8:33 AM

## 2019-08-03 NOTE — Care Management (Signed)
   The overall goal for the admission was met for:   Discharge location: Home with spouse and son/daughter in law  Length of Stay: 9 days with discharge 08/03/19  Discharge activity level: Patient to discharge at overall Supervision level.  Home/community participation: Clinical research associate provided included: MD, RD, PT, OT, SLP, RN, CM, TR, Pharmacy, Neuropsych and SW  Financial Services: Medicare  Follow-up services arranged: Home Health: PT, OT, SLP with [facility], DME: TTB from [employer] and Patient/Family has no preference for HH/DME agencies  Comments (or additional information): [facility] [phone]  Patient/Family verbalized understanding of follow-up arrangements: Yes   Education with pt's son has been ongoing, as he has been present during nearly every therapy session Individual responsible for coordination of the follow-up plan: Son Loretto Valdez:[phone]  Confirmed correct DME delivered: Margarito Liner 08/03/2019    Margarito Liner

## 2019-08-04 ENCOUNTER — Telehealth: Payer: Self-pay | Admitting: Registered Nurse

## 2019-08-04 MED ORDER — AMIODARONE HCL 200 MG PO TABS: 200.0000 mg | ORAL_TABLET | Freq: Two times a day (BID) | ORAL | 0 refills | Status: DC

## 2019-08-04 NOTE — Telephone Encounter (Signed)
Transitional Care call  Transitions Questions Answered by Son: [name]   Patient name: Jamie Mitchell  DOB: 03-Jul-1945 1. Are you/is patient experiencing any problems since coming home? No. a. Are there any questions regarding any aspect of care? No 2. Are there any questions regarding medications administration/dosing? Yes, [name] states he doesn't have the amiodarone, also stated he called and spoke with Dorien Chihuahua RN. This provider placed a call to Dorien Chihuahua RN regarding the above. Amiodarone e-scribed to [facility].  a. Are meds being taken as prescribed? All except above Amiodarone, Amiodarone e-scribed today. b. "Patient should review meds with caller to confirm" Medication List Reviewed. 3. Have there been any falls? No. 4. Has Home Health been to the house and/or have they contacted you? No, [facility]. a. If not, have you tried to contact them? No. Can we help you contact them? Yes,  This provider will call [facility] and call [name]. He verbalizes understanding.   5. Are bowels and bladder emptying properly? Yes.  a. Are there any unexpected incontinence issues? No b. If applicable, is patient following bowel/bladder programs? No 6. Any fevers, problems with breathing, unexpected pain? No 7. Are there any skin problems or new areas of breakdown? No 8. Has the patient/family member arranged specialty MD follow up (ie cardiology/neurology/renal/surgical/etc.)?  [name] was instructed to call [facility], Dr. Rosario Jacks, Dr. Lovena Le and Dr. Gardiner Rhyme, he verbalizes understanding.  a. Can we help arrange? No.  9. Does the patient need any other services or support that we can help arrange? No 10. Are caregivers following through as expected in assisting the patient? Yes 11. Has the patient quit smoking, drinking alcohol, or using drugs as recommended? (                        )  Appointment date/time 08/11/2019  arrival  time 12:30 for 12:45 appointment with Dr. Letta Pate. At  [facility]

## 2019-08-04 NOTE — Telephone Encounter (Signed)
Mr. Jamie Mitchell, reports he hasn't heard from [facility], call placed. The physical Therapist seen Jamie Mitchell, she will be transition to Outpatient Therapy. We will f/u on the above on Monday 08/07/2019. Placed a call to Mr. Jamie Mitchell regarding the above, he verbalizes understanding.

## 2019-08-07 ENCOUNTER — Telehealth: Payer: Self-pay | Admitting: Registered Nurse

## 2019-08-07 NOTE — Telephone Encounter (Signed)
Placed a call to Dorien Chihuahua RN: Regarding outpatient Therapy for Ms. Venegas.  Neoma Laming reports she is in the process of setting up Outpatient Therapy for Ms. Venegas at [facility], and will call the son [name] when everything is finalized.

## 2019-08-08 ENCOUNTER — Telehealth: Payer: Self-pay

## 2019-08-08 NOTE — Telephone Encounter (Signed)
Mickel Baas from [facility] agency called stating patient does not qualify for [facility] therapies and would need order for Outpt therapies. Zella Ball placed a called to Nestor Lewandowsky, RN to place the order. Spoke to Nestor Lewandowsky, RN today and she informed me that order has been placed.

## 2019-08-09 ENCOUNTER — Encounter: Payer: Self-pay | Admitting: *Deleted

## 2019-08-09 ENCOUNTER — Other Ambulatory Visit: Payer: Self-pay | Admitting: *Deleted

## 2019-08-09 NOTE — Patient Outreach (Signed)
[facility]) Care Management  08/09/2019  Jamie Mitchell September 20, 1945 [id number]   Subjective:  Telephone call to patient's son / designated party release [facility]) , home / mobile number, spoke with son, stated patient's name, date of birth, and address. .  Discussed [place] Management Medicare [facility] Liaison referral  follow up, son voiced understanding, and is in agreement to follow up on patient's behalf.  Son states patient is doing very well, and is currently at son's home being cared for by wife, while he is at work.  States patient  is able to manage some self care and has assistance as needed.  States stroke affected patient's ability to talk, is improving everyday, and saying more words.  States patient lives with he, and his wife, patient only speaks Spanish, he, and his wife interpret for patient as needed.   He states written materials for patient can be sent to his home in [place] or [place].    Son states patient has the following scheduled hospital follow up appointments: on 08/11/2019 with Physical Medicine and Rehabilitation MD, on 08/14/2019 with [facility],  On 08/16/2019 with cardiology, and on 09/04/2019 with Interventional Radiology,on 09/04/2019 with primary MD.   Discussed importance of hospital follow up with primary MD, son voices understanding, and states he will follow up as appropriate.   Son states he is aware of signs/ symptoms to report, how to reach provider if needed after hours, when to go to ED, and / or call 911.  Patient voices understanding of medical diagnosis and treatment plan. Son states patient has a history of hypertension, was taken off blood pressure medications while she was in the hospital and son is planning to discuss with MD during follow up visit, or will call MD prior next appointment with update as needed.   Son advised per chart review, patients blood pressure was 100/73, according to the discharge  summary, and son states he will follow up as needed.  Discussed Advanced Directives, advised of [place] Management Social Worker  Advanced Directives document completion benefit, son voices understanding, and in agreement to a referral to Education officer, museum will to ask Education officer, museum to send [employer], and follow up on document completion.  Son states patient does not have any Air traffic controller, transition of care, transportation, community resource, or pharmacy needs at this time.  Son in agreement to receiving the following services on patient's behalf: care coordination for hospital follow up, hypertension disease management, and disease monitoring.   States he is very appreciative of the follow up, is in agreement to receive [place] Management information, and services on patient's behalf.     Objective: Per KPN (Knowledge Performance Now, point of care tool) and chart review, patient hospitalized 07/25/2019 - 08/03/2019 for Left middle cerebral artery stroke rehab.  Patient hospitalized 07/19/2019 - [id number] for Embolic stroke involving left middle cerebral artery, status post tPA, mechanical thrombectomy, due to Atrial fibrillation.   Patient also has a history of Hypothyroidism, Hypertension, Prediabetes,  Aphasia as late effect of cerebrovascular accident,  and Dysphagia.    Assessment: Received [facility] Liaison referral on 08/03/2019.  Referral source: Natividad Brood.   Referral reason: This was a hospital referral from the nutritionist/patient on SSI family needed teaching and support -Has home health; [facility] is TOC.  Please assign to [place] Coordinator for complex care and disease management follow up calls, patient is aphasic  and speaks/understands Spanish, her son is the contact person and speaks fluent [place], his name is [name], (Son) [phone] and assess for further needs. Patient was in the rehab center. Screening follow up  completed, will refer to Education officer, museum for Advanced Directives follow up, and RNCM  will continue to follow for care management needs.      Plan: RNCM will send patient welcome packet, with consent, [facility] pamphlet, and magnet. RNCM will call patient for telephone outreach attempt, within 21 business days, telephone assessment.    Topaz Raglin H. Annia Friendly, BSN, [place] Management [facility] Telephonic CM Phone: [phone] Fax: [phone]

## 2019-08-11 ENCOUNTER — Encounter: Payer: Medicare Other | Attending: Physical Medicine & Rehabilitation | Admitting: Physical Medicine & Rehabilitation

## 2019-08-11 ENCOUNTER — Other Ambulatory Visit: Payer: Self-pay

## 2019-08-11 ENCOUNTER — Encounter: Payer: Self-pay | Admitting: Physical Medicine & Rehabilitation

## 2019-08-11 VITALS — BP 115/76 | HR 54 | Temp 97.3°F | Ht 61.0 in | Wt 151.0 lb

## 2019-08-11 DIAGNOSIS — I63412 Cerebral infarction due to embolism of left middle cerebral artery: Secondary | ICD-10-CM | POA: Diagnosis not present

## 2019-08-11 DIAGNOSIS — I69320 Aphasia following cerebral infarction: Secondary | ICD-10-CM

## 2019-08-11 DIAGNOSIS — I63512 Cerebral infarction due to unspecified occlusion or stenosis of left middle cerebral artery: Secondary | ICD-10-CM | POA: Diagnosis not present

## 2019-08-11 NOTE — Progress Notes (Signed)
Subjective:    Patient ID: Jamie Mitchell, female    DOB: 14-Apr-1946, 74 y.o.   MRN: [id number] 74 y.o. right-handed limited English speaking female with history of hypertension as well as hypothyroidism.  Per chart review lives alone in [place] was here visiting family in the area for the last 6 months.  Son in the area with excellent support.  Presented 07/19/2019 with right-sided weakness.  Cranial CT/MRI/MRA showed subtle loss of gray-white matter differentiation involving the left frontal operculum concerning for acute left MCA territory infarction.  Small chronic high left frontal infarction.  Patient did receive TPA.  CT angiogram of head and neck positive for emergent large vessel occlusion with occlusive thrombus within proximal left M2 branch superior division.  Patient underwent revascularization per interventional radiology and remained intubated for a short time through 07/23/2019.  Echocardiogram with ejection fraction of 65% without emboli.  Cardiology follow-up for bouts of atrial fibrillation and flutter maintained on amiodarone with taper as directed.  Latest MRI showed large acute left MCA infarction punctate acute left parietal and occipital infarct with residual small volume SAH.  Maintained on aspirin for CVA prophylaxis plan was to begin Eliquis in 7 to 10 days with resolution of SAH.  Dysphagia #1 thin liquid diet.  Nasogastric tube initially in place for nutritional support.  Prediabetes with hemoglobin A1c of 6.1.  Patient was admitted for a comprehensive rehab program     Admit date: 07/25/2019 Discharge date: 08/03/2019 Transitional care call completed on 08/04/2019 HPI Patient has been discharged from rehab 8 days ago.  Home health therapies came to evaluate the patient but established that she was not homebound.  This office was contacted and on 3/15 and 3/16 referrals were made for outpatient therapy The patient brought her medications in with her today.  She is  reportedly taking all of her medicines including the Eliquis. She had a prescription for Lipitor 80 mg and her prescription is for 40 mg therefore her son cuts the tablets in half. The patient lives with her son who is with her today.  A Spanish language interpreter is with her today as well. The patient is aphasic and has difficulty communicating.  She has difficulty following directions.  Her son does answer for her. The patient has not fallen at home. She is able to bathe and dress herself independently. She walks without an assistive device. She has an appointment with a primary care physician on April 12 Dr. Margarita Rana There is an IR appointment with Dr. Estanislado Pandy on April 12 as well The patient has an appointment for cardiology March 24 Dr. Nechama Guard The patient has been advised to follow-up with neurology but has not had a call from the office yet Pain Inventory Average Pain 0 Pain Right Now 0 My pain is no pain  In the last 24 hours, has pain interfered with the following? General activity 0 Relation with others 0 Enjoyment of life 0 What TIME of day is your pain at its worst? no pain Sleep (in general) Fair  Pain is worse with: no pain Pain improves with: no pain Relief from Meds: no pain  Mobility walk without assistance how many minutes can you walk? 5 ability to climb steps?  yes do you drive?  no  Function not employed: date last employed .  Neuro/Psych No problems in this area  Prior Studies transitional care  Physicians involved in your care transitional care   Family History  Family history unknown: Yes  Social History   Socioeconomic History  . Marital status: Married    Spouse name: Not on file  . Number of children: Not on file  . Years of education: Not on file  . Highest education level: Not on file  Occupational History  . Not on file  Tobacco Use  . Smoking status: Never Smoker  . Smokeless tobacco: Never Used  Substance and Sexual  Activity  . Alcohol use: No  . Drug use: No  . Sexual activity: Not Currently  Other Topics Concern  . Not on file  Social History Narrative  . Not on file   Social Determinants of Health   Financial Resource Strain:   . Difficulty of Paying Living Expenses:   Food Insecurity:   . Worried About Charity fundraiser in the Last Year:   . Arboriculturist in the Last Year:   Transportation Needs:   . Film/video editor (Medical):   [name] Lack of Transportation (Non-Medical):   Physical Activity:   . Days of Exercise per Week:   . Minutes of Exercise per Session:   Stress:   . Feeling of Stress :   Social Connections:   . Frequency of Communication with Friends and Family:   . Frequency of Social Gatherings with Friends and Family:   . Attends Religious Services:   . Active Member of Clubs or Organizations:   . Attends Archivist Meetings:   [name] Marital Status:    Past Surgical History:  Procedure Laterality Date  . IR CT HEAD LTD  07/20/2019  . IR PERCUTANEOUS ART THROMBECTOMY/INFUSION INTRACRANIAL INC DIAG ANGIO  07/20/2019  . RADIOLOGY WITH ANESTHESIA N/A 07/20/2019   Procedure: IR WITH ANESTHESIA;  Surgeon: Luanne Bras, MD;  Location: [facility];  Service: Radiology;  Laterality: N/A;   Past Medical History:  Diagnosis Date  . Hypertension   . Thyroid disease    BP 115/76   Pulse (!) 54   Temp (!) 97.3 F (36.3 C)   Ht 5\' 1"  (1.549 m)   Wt 151 lb (68.5 kg)   SpO2 97%   BMI 28.53 kg/m   Opioid Risk Score:   Fall Risk Score:  `1  Depression screen PHQ 2/9  Depression screen [facility] 2/9 08/09/2019 02/26/2017 08/24/2016 02/20/2016 02/20/2016 08/21/2015 07/31/2015  Decreased Interest (No Data) 0 0 0 0 0 0  Down, Depressed, Hopeless (No Data) 0 0 0 0 0 0  PHQ - 2 Score - 0 0 0 0 0 0  Altered sleeping - 0 0 0 - - -  Tired, decreased energy - 0 0 0 - - -  Change in appetite - 0 0 0 - - -  Feeling bad or failure about yourself  - 0 0 0 - - -  Trouble concentrating -  0 0 0 - - -  Moving slowly or fidgety/restless - 0 0 0 - - -  Suicidal thoughts - 0 0 0 - - -  PHQ-9 Score - 0 0 0 - - -     Review of Systems  Constitutional: Negative.   HENT: Negative.   Eyes: Negative.   Respiratory: Negative.   Cardiovascular: Negative.   Gastrointestinal: Negative.   Endocrine: Negative.   Genitourinary: Negative.   Musculoskeletal: Negative.   Skin: Negative.   Allergic/Immunologic: Negative.   Neurological: Negative.   Hematological: Negative.   Psychiatric/Behavioral: Negative.   All other systems reviewed and are negative.      Objective:  Physical Exam Vitals and nursing note reviewed.  Constitutional:      Appearance: Normal appearance.  HENT:     Head: Normocephalic and atraumatic.  Eyes:     Extraocular Movements: Extraocular movements intact.     Conjunctiva/sclera: Conjunctivae normal.     Pupils: Pupils are equal, round, and reactive to light.  Cardiovascular:     Rate and Rhythm: Normal rate. Rhythm irregular.     Pulses: Normal pulses.     Heart sounds: Normal heart sounds. No murmur.  Pulmonary:     Effort: Pulmonary effort is normal. No respiratory distress.     Breath sounds: Normal breath sounds. No stridor. No wheezing or rhonchi.  Abdominal:     General: Abdomen is flat. Bowel sounds are normal. There is no distension.     Palpations: Abdomen is soft. There is no mass.  Musculoskeletal:     Cervical back: Normal range of motion.     Comments: No pain with range of motion left shoulder no pain with range of motion right shoulder  Skin:    General: Skin is warm and dry.  Neurological:     Mental Status: She is alert.     Comments: The patient has 5/5 strength bilateral deltoid bicep tricep grip hip flexor knee extensor ankle dorsiflexor Gait is without evidence of toe drag or knee instability she is able to toe walk and heel walk.  She has mild difficulty with tandem gait Negative Romberg Comprehension she has difficulty  following simple commands such as touch her right ear or touch her left ear she is unable to She is able to close and open her eyes to command.   Psychiatric:        Mood and Affect: Mood normal.        Behavior: Behavior normal.           Assessment & Plan:  #1.  Left MCA distribution infarct with history of atrial fibrillation. The patient has had no signs of new neurologic deficits She has had no complications such as falls She remains independent with her self-care and mobility. I think she will need primarily outpatient speech therapy. Do not anticipate need for ongoing physical medicine rehab.  The patient has a follow-up appointment with cardiology for medication check she remains on amiodarone 200 mg twice daily and Eliquis.  The patient has a follow-up appointment with IR, history of revascularization left MCA  Patient has a follow-up with primary care who will assume prescriptions, I reviewed her medications and she should have enough medication until that appointment  We discussed need for neurology follow-up I will send a message to Dr. Leonie Man and have the office call the patient's son

## 2019-08-11 NOTE — Patient Instructions (Signed)
Prevencin del accidente cerebrovascular Stroke Prevention Algunas afecciones mdicas y elecciones en el estilo de vida pueden conducir a un mayor riesgo de Best boy un accidente cerebrovascular. Realizando cambios, por ejemplo, en la alimentacin y en el estilo de vida, se puede ayudar a prevenir un accidente cerebrovascular. Qu cambios en la alimentacin se pueden realizar?   Consuma alimentos saludables. ? Elija alimentos que sean ricos en fibra. Estos incluyen los siguientes:  Frutas frescas.  Verduras frescas.  Cereales integrales. ? Coma 5 o ms porciones de frutas y Primary school teacher. Trate de que la mitad del plato de cada comida sea de frutas y verduras. ? Elija alimentos con protenas magras. Estos incluyen los siguientes:  Cortes de carne con poca grasa (magros).  Pollo sin piel.  Pescado.  Tofu.  Frijoles.  Frutos secos. ? Consuma productos lcteos descremados. ? Evite alimentos que:  Tengan un alto contenido de sal (sodio).  Tengan grasas saturadas.  Tengan grasas trans.  Tengan colesterol.  Sean procesados.  Sean precocinados.  Siga las recomendaciones nutricionales que le d el mdico. Estos tratamientos pueden incluir: ? Reducir la cantidad de caloras que ingiere Armed forces operational officer. ? Limitar la cantidad de sal que ingiere cada da a 1,523miligramos (mg). ? Usar solo grasas saludables para Audiological scientist. Estos incluyen los siguientes:  Aceite de [place].  Aceite de canola.  Aceite de girasol. ? Contar cuntos carbohidratos ingiere Armed forces operational officer. Qu cambios en el estilo de vida se pueden realizar?  Intente mantener un peso saludable. Consulte a su mdico para saber cul debera ser su peso adecuado.  Haga por lo menos 94minutos de [place] fsica moderada 5 das por semana. Esto puede incluir lo siguiente: ? Caminar rpidamente. ? Andar en bicicleta. ? Natacin.  No consuma ningn producto que contenga nicotina o tabaco. Esto incluye cigarrillos y  cigarrillos electrnicos. Si necesita ayuda para dejar de fumar, consulte al mdico. Evite estar en lugares donde hay humo de tabaco.  Limite cunto alcohol bebe a no ms de 50medida por da si es mujer y no est [place], y 68medidas por da si es hombre. Una medida equivale a 12oz de Chartered certified accountant, 5oz de vino o 1oz de bebidas alcohlicas de alta graduacin.  No consuma drogas.  Evite tomar pldoras anticonceptivas. Hable con su mdico acerca de los riesgos de tomar pldoras anticonceptivas si: ? Es mayor de 35aos. ? Fuma. ? Tiene migraas. ? Alguna vez ha tenido un cogulo de [place]. Qu otros cambios se pueden realizar?  Controle su colesterol. ? Es importante que siga una dieta saludable. ? Si el colesterol no puede controlarse con la dieta, es posible que tambin deba tomar medicamentos. Tome los [employer] como le indic el mdico.  Controle su diabetes. ? Es importante que siga una dieta saludable y haga ejercicio con regularidad. ? Si su nivel de azcar en la sangre no puede controlarse mediante dieta y ejercicio, es posible que deba tomar medicamentos. Tome los medicamentos como le indic el mdico.  Controle la presin arterial alta (hipertensin). ? Intente mantener su presin arterial por debajo de 130/80. Esto puede ayudarle a disminuir su riesgo de accidente cerebrovascular. ? Es importante que siga una dieta saludable y haga ejercicio con regularidad. ? Si su presin arterial no puede controlarse mediante dieta y ejercicio, es posible que deba tomar medicamentos. Tome los medicamentos como le indic el mdico. ? Pregntele al mdico si debera controlar su presin arterial en su casa. ? Hgase controlar la presin arterial todos los aos. Hgalo aunque su presin  arterial sea normal.  Consulte a su mdico sobre la necesidad de una evaluacin para determinar si tiene un trastorno del sueo. Algunos signos pueden ser: ? Technical brewer. ? Mucho cansancio.  Use los  medicamentos de venta libre y los recetados solamente como se lo haya indicado el mdico. Estos pueden incluir aspirina o medicamentos que licuan la sangre (antiagregantes plaquetarios o anticoagulantes).  Asegrese de tener controlada cualquier otra afeccin mdica que tenga. Dnde buscar ms informacin  American Stroke Association (Asociacin Americana de Accidente Cerebrovascular): www.strokeassociation.org  National Stroke Association Arts development officer de Accidente Cerebrovascular): www.stroke.org Solicite ayuda inmediatamente si:  Tiene sntomas de un accidente cerebrovascular. "BE FAST" es una manera fcil de recordar las principales seales de advertencia: ? B - Balance (equilibrio). Los signos son mareos, dificultad repentina para caminar o prdida del equilibrio. ? E - Eyes (ojos). Los signos son dificultad para ver o un cambio repentino en la visin. ? F - Face (rostro). Los signos son debilidad repentina o prdida de sensacin en el rostro, o el rostro o el prpado que se caen hacia un lado. ? A - Arms (brazos). Los signos son debilidad o prdida de la sensibilidad en un brazo. Esto sucede de repente y generalmente en un lado del cuerpo. ? S - Speech (habla). Los signos son dificultad para hablar, hablar arrastrando las palabras o dificultad para comprender lo que la gente dice. ? T - Time (tiempo). Es tiempo de llamar al servicio de Multimedia programmer. Anote la hora a la que [employer] sntomas.  Presenta otros signos de accidente cerebrovascular, como los siguientes: ? Dolor de [place] repentino y muy intenso sin causa aparente. ? Malestar estomacal (nuseas). ? Vmitos. ? Movimientos espasmdicos que no puede controlar (convulsiones). Estos sntomas pueden representar un problema grave que constituye Engineer, maintenance (IT). No espere a ver si los sntomas desaparecen. Solicite atencin mdica de inmediato. Comunquese con el servicio de emergencias de su localidad (911 en los [place]). No conduzca por sus propios medios [employer]. Resumen  Un accidente cerebrovascular se puede prevenir con una alimentacin saludable, haciendo ejercicio, no fumando, bebiendo menos alcohol y recibiendo tratamiento para otros problemas de salud, como la diabetes, la presin arterial alta o el colesterol alto.  No consuma ningn producto que contenga nicotina o tabaco, como cigarrillos y Psychologist, sport and exercise.  Obtenga ayuda de inmediato si tiene signos o sntomas de un accidente cerebrovascular. Esta informacin no tiene Marine scientist el consejo del mdico. Asegrese de hacerle al mdico cualquier pregunta que tenga. Document Revised: 08/04/2018 Document Reviewed: 08/04/2018 Elsevier Patient Education  [employer].

## 2019-08-14 ENCOUNTER — Encounter: Payer: Self-pay | Admitting: Occupational Therapy

## 2019-08-14 ENCOUNTER — Ambulatory Visit: Payer: Medicare Other | Attending: Family Medicine | Admitting: Occupational Therapy

## 2019-08-14 ENCOUNTER — Encounter: Payer: Self-pay | Admitting: Rehabilitation

## 2019-08-14 ENCOUNTER — Other Ambulatory Visit: Payer: Self-pay

## 2019-08-14 ENCOUNTER — Ambulatory Visit: Payer: Medicare Other

## 2019-08-14 ENCOUNTER — Ambulatory Visit: Payer: Medicare Other | Admitting: Rehabilitation

## 2019-08-14 DIAGNOSIS — R1312 Dysphagia, oropharyngeal phase: Secondary | ICD-10-CM | POA: Diagnosis not present

## 2019-08-14 DIAGNOSIS — R2681 Unsteadiness on feet: Secondary | ICD-10-CM

## 2019-08-14 DIAGNOSIS — R4701 Aphasia: Secondary | ICD-10-CM

## 2019-08-14 DIAGNOSIS — R482 Apraxia: Secondary | ICD-10-CM | POA: Insufficient documentation

## 2019-08-14 DIAGNOSIS — M6281 Muscle weakness (generalized): Secondary | ICD-10-CM

## 2019-08-14 DIAGNOSIS — I69351 Hemiplegia and hemiparesis following cerebral infarction affecting right dominant side: Secondary | ICD-10-CM

## 2019-08-14 DIAGNOSIS — I69318 Other symptoms and signs involving cognitive functions following cerebral infarction: Secondary | ICD-10-CM | POA: Insufficient documentation

## 2019-08-14 DIAGNOSIS — R41841 Cognitive communication deficit: Secondary | ICD-10-CM

## 2019-08-14 DIAGNOSIS — R2689 Other abnormalities of gait and mobility: Secondary | ICD-10-CM | POA: Diagnosis not present

## 2019-08-14 NOTE — Patient Instructions (Signed)
   Aphasia  - it is a disorder of language when people have trouble understanding and speaking language

## 2019-08-14 NOTE — Therapy (Signed)
[facility] [address] [place], [place], [address] Phone: [phone]   Fax:  [phone]  Occupational Therapy Evaluation  Patient Details  Name: Jamie Mitchell MRN: [id number] Date of Birth: 1945/10/31 Referring Provider (OT): Dr. Letta Pate   Encounter Date: 08/14/2019  OT End of Session - 08/14/19 1310    Visit Number  1    Number of Visits  9    Date for OT Re-Evaluation  09/11/19    Authorization Type  medicare - will need PN every 10th visit    Authorization Time Period  90 days - 11/06/2019    Authorization - Visit Number  1    Authorization - Number of Visits  10    OT Start Time  0933    OT Stop Time  1015    OT Time Calculation (min)  42 min    Activity Tolerance  Patient tolerated treatment well       Past Medical History:  Diagnosis Date  . Hypertension   . Thyroid disease     Past Surgical History:  Procedure Laterality Date  . IR CT HEAD LTD  07/20/2019  . IR PERCUTANEOUS ART THROMBECTOMY/INFUSION INTRACRANIAL INC DIAG ANGIO  07/20/2019  . RADIOLOGY WITH ANESTHESIA N/A 07/20/2019   Procedure: IR WITH ANESTHESIA;  Surgeon: Luanne Bras, MD;  Location: [facility];  Service: Radiology;  Laterality: N/A;    There were no vitals filed for this visit.  Subjective Assessment - 08/14/19 0941    Subjective   Pt is aphasic and is unable to share info at this time.    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  No/denies        [facility] OT Assessment - 08/14/19 0001      Assessment   Medical Diagnosis  L MCA CVA    Referring Provider (OT)  Dr. Letta Pate    Onset Date/Surgical Date  07/19/19    Hand Dominance  Right    Prior Therapy  inpt rehab for PT, OT and ST      Precautions   Precautions   Fall   gait deviations pt has PT eval today     Restrictions   Weight Bearing Restrictions  No      Balance Screen   Has the patient fallen in the past 6 months  No   has PT eval today     Home  Environment   Family/patient expects to be discharged to:  Private residence    Living Arrangements  Children   son, dtr in law and 4 grandchildren   Available Help at Discharge  Available 24 hours/day    Type of [place]  One level    Bathroom Shower/Tub  Tub/Shower unit    Field seismologist    Additional Comments  transfer tub bench,       Prior Function   Level of Independence  Independent    Leisure  cooking, likes to clean      ADL   Eating/Feeding  Minimal assistance   for cuttting food   Grooming  Modified independent    Upper Body Bathing  Minimal assistance   for back only  Lower Body Bathing  Supervision/safety    Upper Body Dressing  Independent    Lower Body Dressing  Independent    Toilet Transfer  Independent    [place]    Tub/Shower Transfer  Supervision/safety      IADL   Shopping  Needs to be accompanied on any shopping trip   pt did not drive before   Light Housekeeping  Performs light daily tasks such as dishwashing, bed making    Meal Prep  Able to complete simple cold meal and snack prep   cold drink and snack only   Community Mobility  Relies on family or friends for transportation   pt did not drive before   Medication Management  Is not capable of dispensing or managing own medication    Financial Management  Requires assistance      Mobility   Mobility Status  Independent    Mobility Status Comments  pt has gait deviations and has PT eval today      Written Expression   Dominant Hand  Right    Handwriting  --   pt able to print name but is aphasic     Vision - History   Baseline Vision  Wears glasses all the time    Additional Comments   Difficult to assess due to language barrier - family reports that they have not noticed any visual defcits and pt shakes head no when asked      Activity Tolerance   Activity Tolerance  Tolerates 30 min activity with multiple rests      Cognition   Overall Cognitive Status  Impaired/Different from baseline    Mini Mental State Exam   No obvious cognitive deficits.  Will be difficullt to separate language from cognition from apraxia.  Dtr in law also reports pt was "always busy before always moving 100 miles per hour" which may cause pt to appear impulsive. Dtr in law denies any impulsivity at home or unsafe behaviors and does not feel like pt is exhibiting memory deficits. Pt able to attend in busy clinical envrioment with no redirection. Will continue to monitor functionally - see ST eval for further assessment.     Cognition Comments  dtr in law reports slow processing but denies any impusivity or unsafe behaviors. Denies any memory problems.  Will need to assess via functional activity      Posture/Postural Control   Posture/Postural Control  Postural limitations      Sensation   Light Touch  Appears Intact    Hot/Cold  Appears Intact    Proprioception  Appears Intact      Coordination   Gross Motor Movements are Fluid and Coordinated  Yes    Other  suspect pt may be apraxic had signficant difficulty with finger to nose with both hands      Praxis   Praxis  --   suspect pt may be apraxic will montior via functional act.     Tone   Assessment Location  Right Upper Extremity      ROM / Strength   AROM / PROM / Strength  AROM;Strength      AROM   Overall AROM   Within functional limits for tasks performed    Overall AROM Comments  BUE's      Strength   Overall Strength  Deficits    Overall Strength Comments  WFL's for BUE"s except for  R grip strength      Hand Function   Right Hand Gross Grasp  Impaired    Right Hand Grip (lbs)  25    Left Hand Gross Grasp  Functional     Left Hand Grip (lbs)  30      RUE Tone   RUE Tone  Within Functional Limits                           OT Long Term Goals - 08/14/19 1257      OT LONG TERM GOAL #1   Title  Pt and family will be mod I with HEP for grip strength and home activities program for balance, increased independence and activity tolerance - 09/11/2019    Status  New      OT LONG TERM GOAL #2   Title  Pt will demonstrate improved grip strength in R hand by at least 4 pounds to assist with home mgmt tasks    Status  New      OT LONG TERM GOAL #3   Title  Pt will require no more than supervision to cook simple familiar hot meal    Status  New      OT LONG TERM GOAL #4   Title  Pt will demonstrate ability to complete at least 40 minutes of functional familiar activity with no more than one rest break    Status  New            Plan - 08/14/19 1303    Clinical Impression Statement  Pt is a 74 year old Spanish speaking female 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 Pt was living along in [place] and was in the [place] visiting her son when she had her stroke. Pt was d/c home to son's home on 08/03/19 after course of inpt rehab for PT, OT and ST. Pt is globally aphasic but is attempting to speak and is able to follow some gestures within a functional context.  Pt present today with the following deficits that impact independence:  R hand weakness, apraxia, balance deficits, abnormal posture, cognitive deficts.  Pt will benefit from skilled OT to address these deficits and maximize independent functioning.    OT Occupational Profile and History  Detailed Assessment- Review of Records and additional review of physical, cognitive, psychosocial history related to current functional performance    Occupational performance deficits (Please refer to evaluation for details):  ADL's;IADL's;Leisure;Social Participation;Rest and Sleep    Body Structure / Function /  Physical Skills  ADL;Balance;Endurance;IADL;Mobility;Strength;UE functional use    Rehab Potential  Good    Clinical Decision Making  Several treatment options, min-mod task modification necessary    Comorbidities Affecting Occupational Performance:  May have comorbidities impacting occupational performance    Modification or Assistance to Complete Evaluation   Min-Moderate modification of tasks or assist with assess necessary to complete eval    OT Frequency  2x / week    OT Duration  4 weeks    OT Treatment/Interventions  Self-care/ADL training;Aquatic Therapy;Therapeutic exercise;Neuromuscular education;Energy conservation;Functional Mobility Training;Patient/family education;Cognitive remediation/compensation;Therapeutic activities;Balance training    Plan  review goals and POC, initiate HEP for grip strength as able with language barrier (aphasia), address balance, observe simple cold meal prep    Consulted and Agree with Plan of Care  Patient;Family member/caregiver    Family Member Consulted  dtr in law [place]  Patient will benefit from skilled therapeutic intervention in order to improve the following deficits and impairments:   Body Structure / Function / Physical Skills: ADL, Balance, Endurance, IADL, Mobility, Strength, UE functional use       Visit Diagnosis: Muscle weakness (generalized) - Plan: Ot plan of care cert/re-cert  Unsteadiness on feet - Plan: Ot plan of care cert/re-cert  Apraxia - Plan: Ot plan of care cert/re-cert  Other symptoms and signs involving cognitive functions following cerebral infarction - Plan: Ot plan of care cert/re-cert    Problem List Patient Active Problem List   Diagnosis Date Noted  . Renal failure 07/25/2019  . Prediabetes 07/25/2019  . Left middle cerebral artery stroke ([place]) 07/25/2019  . 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 Dysphagia 07/25/2019  . 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 Acute respiratory failure ([place])   . Tachycardia-bradycardia syndrome ([place])   . Atrial fibrillation with RVR ([place])   . Hypertension 07/23/2015  . Hyperlipidemia 07/23/2015  . 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 Hypothyroidism 01/25/2014    Quay Burow, OTR/L 08/14/2019, 1:15 PM  [facility] [address] [place], [place], [address] Phone: [phone]   Fax:  [phone]  Name: Jamie Mitchell MRN: [id number] Date of Birth: 1946-04-18

## 2019-08-14 NOTE — Progress Notes (Signed)
Cardiology Office Note:    Date:  08/16/2019   ID:  Jamie, Mitchell 1945-09-27, MRN [id number]  PCP:  Alfonse Spruce, FNP  Cardiologist:  Donato Heinz, MD  Electrophysiologist:  None   Referring MD: Orlando Penner*   Chief Complaint  Patient presents with  . Atrial Fibrillation    History of Present Illness:    Jamie Mitchell is a 74 y.o. female with a hx of hypertension, HLD, and thyroid disease  who presents for hospital follow-up of atrial fibrillation, tachybradycardia syndrome.  Patient moved from [place] to [place] last month.  She was admitted on 07/19/2019 to [facility] with acute CVA.  TPA was administered, and CTA head showed M2 occlusion.  Thrombectomy and revascularization were complicated by subarachnoid hemorrhage.  She was admitted to the neuro ICU.  Course was complicated by intermittent atrial fibrillation/atrial flutter with RVR with postconversion pauses up to 8 seconds.  EP was consulted, and she was started on amiodarone.  She was discharged on Eliquis 5 mg twice daily and amiodarone 200 mg twice daily.  TTE during admission showed EF 60 to 65%, normal RV function, mild MR.  Since discharge from the hospital, she reports that she is improving.  Continues to have mild weakness and difficulty speaking.  She denies any palpitations, lightheadedness, chest pain, dyspnea, or syncope.  Has been taking Eliquis, denies any bleeding issues.   Past Medical History:  Diagnosis Date  . Hypertension   . Thyroid disease     Past Surgical History:  Procedure Laterality Date  . IR CT HEAD LTD  07/20/2019  . IR PERCUTANEOUS ART THROMBECTOMY/INFUSION INTRACRANIAL INC DIAG ANGIO  07/20/2019  . RADIOLOGY WITH ANESTHESIA N/A 07/20/2019   Procedure: IR WITH ANESTHESIA;  Surgeon: Luanne Bras, MD;  Location: [facility];  Service: Radiology;  Laterality: N/A;    Current Medications: Current Meds  Medication Sig  . acetaminophen (TYLENOL)  325 MG tablet Take 2 tablets (650 mg total) by mouth every 4 (four) hours as needed for mild pain (or temp > 37.5 C (99.5 F)).  [name] apixaban (ELIQUIS) 5 MG TABS tablet Take 1 tablet (5 mg total) by mouth 2 (two) times daily.  [name] aspirin EC 81 MG EC tablet Take 1 tablet (81 mg total) by mouth daily.  [name] atorvastatin (LIPITOR) 40 MG tablet Take 1 tablet (40 mg total) by mouth daily.  . diclofenac Sodium (VOLTAREN) 1 % GEL Apply 2 g topically 4 (four) times daily as needed (pain).  [name] levothyroxine (SYNTHROID) 100 MCG tablet Take 0.5-1 tablets (50-100 mcg total) by mouth See admin instructions. 60mcg one day, 57mcg the next day, then repeat.     Allergies:   Patient has no known allergies.   Social History   Socioeconomic History  . Marital status: Married    Spouse name: Not on file  . Number of children: Not on file  . Years of education: Not on file  . Highest education level: Not on file  Occupational History  . Not on file  Tobacco Use  . Smoking status: Never Smoker  . Smokeless tobacco: Never Used  Substance and Sexual Activity  . Alcohol use: No  . Drug use: No  . Sexual activity: Not Currently  Other Topics Concern  . Not on file  Social History Narrative  . Not on file   Social Determinants of Health   Financial Resource Strain:   . Difficulty of Paying Living Expenses:   Food  Insecurity:   . Worried About Charity fundraiser in the Last Year:   . Arboriculturist in the Last Year:   Transportation Needs:   . Film/video editor (Medical):   [name] Lack of Transportation (Non-Medical):   Physical Activity:   . Days of Exercise per Week:   . Minutes of Exercise per Session:   Stress:   . Feeling of Stress :   Social Connections:   . Frequency of Communication with Friends and Family:   . Frequency of Social Gatherings with Friends and Family:   . Attends Religious Services:   . Active Member of Clubs or Organizations:   . Attends Archivist Meetings:   [name]  Marital Status:      Family History: The patient's Family history is unknown by patient.  ROS:   Please see the history of present illness.     All other systems reviewed and are negative.  EKGs/Labs/Other Studies Reviewed:    The following studies were reviewed today:   EKG:  EKG is ordered today.  The ekg ordered today demonstrates sinus bradycardia with sinus arrhythmia, rate 46, no ST/T abnormalities  TTE 07/20/19: 1. Left ventricular ejection fraction, by estimation, is 60 to 65%. The  left ventricle has normal function. The left ventricle has no regional  wall motion abnormalities. Left ventricular diastolic parameters were  normal.  2. Right ventricular systolic function is normal. The right ventricular  size is normal. Tricuspid regurgitation signal is inadequate for assessing  PA pressure.  3. The mitral valve is normal in structure and function. Mild mitral  valve regurgitation. No evidence of mitral stenosis.  4. The aortic valve is normal in structure and function. Aortic valve  regurgitation is not visualized. No aortic stenosis is present.  5. The inferior vena cava is normal in size with greater than 50%  respiratory variability, suggesting right atrial pressure of 3 mmHg.   Recent Labs: 07/21/2019: TSH 7.052 07/26/2019: ALT 38; Hemoglobin 14.8; Platelets 226 07/28/2019: BUN 23; Creatinine, Ser 1.05; Potassium 3.9; Sodium 137 07/29/2019: Magnesium 2.1  Recent Lipid Panel    Component Value Date/Time   CHOL 113 07/20/2019 0356   CHOL 137 02/26/2017 1035   TRIG 57 07/20/2019 0356   HDL 36 (L) 07/20/2019 0356   HDL 35 (L) 02/26/2017 1035   CHOLHDL 3.1 07/20/2019 0356   VLDL 11 07/20/2019 0356   LDLCALC 66 07/20/2019 0356   LDLCALC 49 02/26/2017 1035   LDLDIRECT 78 02/20/2016 1221    Physical Exam:    VS:  BP 122/72   Pulse (!) 46   Ht 5\' 2"  (1.575 m)   Wt 152 lb 12.8 oz (69.3 kg)   BMI 27.95 kg/m     Wt Readings from Last 3 Encounters:  08/16/19  152 lb 12.8 oz (69.3 kg)  08/11/19 151 lb (68.5 kg)  07/25/19 155 lb 6.8 oz (70.5 kg)     [id number] nourished, well developed in no acute distress HEENT: Normal NECK: No JVD CARDIAC: Bradycardic, irregular no murmurs, rubs, gallops RESPIRATORY:  Clear to auscultation without rales, wheezing or rhonchi  ABDOMEN: Soft, non-tender, non-distended MUSCULOSKELETAL:  No edema; No deformity  SKIN: Warm and dry NEUROLOGIC:  Alert and oriented x 3 PSYCHIATRIC:  Normal affect   ASSESSMENT:    1. Atrial fibrillation, unspecified type ([place])   2. Elevated TSH   3. Medication management   4. Sinus pause    PLAN:    In order of  problems listed above:  Atrial fibrillation/atrial flutter: Intermittent episodes of AF/AFL with RVR following acute CVA that was complicated by [facility].  Had postconversion pauses up to 8 seconds during CVA admission.  Currently on Eliquis 5 mg twice daily and amiodarone 200 mg twice daily.  TTE shows normal LVEF. -Continue Eliquis 5 mg twice daily -Currently in sinus bradycardia to 40s.  Will decrease amiodarone to 200 mg daily -Check thyroid studies -Will check Zio patch x2 weeks to monitor for further atrial fibrillation/flutter and pauses.  If tachybrady issues persist with recovery from CVA, will need referral to EP for pacemaker evaluation  RTC in 3 months  Medication Adjustments/Labs and Tests Ordered: Current medicines are reviewed at length with the patient today.  Concerns regarding medicines are outlined above.  Orders Placed This Encounter  Procedures  . TSH  . T4, free  . LONG TERM MONITOR (3-14 DAYS)  . EKG 12-Lead   Meds ordered this encounter  Medications  . amiodarone (PACERONE) 200 MG tablet    Sig: Take 1 tablet (200 mg total) by mouth daily.    Dispense:  60 tablet    Refill:  0    Patient Instructions  Medication Instructions:  DECREASE amiodarone to 200 mg ONCE daily  *If you need a refill on your cardiac medications before your next  appointment, please call your pharmacy*   Lab Work: Today (TSH, freeT4)  If you have labs (blood work) drawn today and your tests are completely normal, you will receive your results only by: [name] MyChart Message (if you have MyChart) OR . A paper copy in the mail If you have any lab test that is abnormal or we need to change your treatment, we will call you to review the results.   Testing/Procedures:  [facility]- Long Term Monitor Instructions   Your physician has requested you wear your ZIO patch monitor 14 days.   This is a single patch monitor.  [employer] supplies one patch monitor per enrollment.  Additional stickers are not available.   Please do not apply patch if you will be having a Nuclear Stress Test, Echocardiogram, Cardiac CT, MRI, or Chest Xray during the time frame you would be wearing the monitor. The patch cannot be worn during these tests.  You cannot remove and re-apply the ZIO XT patch monitor.   Your ZIO patch monitor will be sent USPS Priority mail from [facility] directly to your home address. The monitor may also be mailed to a PO BOX if home delivery is not available.   It may take 3-5 days to receive your monitor after you have been enrolled.   Once you have received you monitor, please review enclosed instructions.  Your monitor has already been registered assigning a specific monitor serial # to you.   Applying the monitor   Shave hair from upper left chest.   Hold abrader disc by orange tab.  Rub abrader in 40 strokes over left upper chest as indicated in your monitor instructions.   Clean area with 4 enclosed alcohol pads .  Use all pads to assure are is cleaned thoroughly.  Let dry.   Apply patch as indicated in monitor instructions.  Patch will be place under collarbone on left side of chest with arrow pointing upward.   Rub patch adhesive wings for 2 minutes.Remove white label marked "1".  Remove white label marked "2".  Rub patch adhesive wings  for 2 additional minutes.   While looking in a mirror,  press and release button in center of patch.  A small green light will flash 3-4 times .  This will be your only indicator the monitor has been turned on.     Do not shower for the first 24 hours.  You may shower after the first 24 hours.   Press button if you feel a symptom. You will hear a small click.  Record Date, Time and Symptom in the Patient Log Book.   When you are ready to remove patch, follow instructions on last 2 pages of Patient Log Book.  Stick patch monitor onto last page of Patient Log Book.   Place Patient Log Book in [place] box.  Use locking tab on box and tape box closed securely.  The [employer] has [employer] on it.  Please place in mailbox as soon as possible.  Your physician should have your test results approximately 7 days after the monitor has been mailed back to [facility].   Call [place] at [phone] if you have questions regarding your ZIO XT patch monitor.  Call them immediately if you see an orange light blinking on your monitor.   If your monitor falls off in less than 4 days contact our Monitor department at [phone].  If your monitor becomes loose or falls off after 4 days call [employer] at [phone] for suggestions on securing your monitor.    Follow-Up: At [facility], you and your health needs are our priority.  As part of our continuing mission to provide you with exceptional heart care, we have created designated Provider Care Teams.  These Care Teams include your primary Cardiologist (physician) and Advanced Practice Providers (APPs -  Physician Assistants and Nurse Practitioners) who all work together to provide you with the care you need, when you need it.  We recommend signing up for the patient portal called "MyChart".  Sign up information is provided on this After Visit Summary.  MyChart is used to connect with patients for Virtual Visits  (Telemedicine).  Patients are able to view lab/test results, encounter notes, upcoming appointments, etc.  Non-urgent messages can be sent to your provider as well.   To learn more about what you can do with MyChart, go to [email or web address].    Your next appointment:   3 month(s)  The format for your next appointment:   In Person  Provider:   Oswaldo Milian, MD        Signed, Donato Heinz, MD  08/16/2019 12:08 PM    [place]

## 2019-08-14 NOTE — Therapy (Signed)
[facility] [address] [place], [place], [address] Phone: [phone]   Fax:  [phone]  Physical Therapy Evaluation  Patient Details  Name: Jamie Mitchell MRN: [id number] Date of Birth: Jan 10, 1946 Referring Provider (PT): Alysia Penna, MD   Encounter Date: 08/14/2019  PT End of Session - 08/14/19 1424    Visit Number  1    Number of Visits  17    Date for PT Re-Evaluation  10/13/19    Authorization Type  Medicare-10th visit progress note    PT Start Time  1105    PT Stop Time  1145    PT Time Calculation (min)  40 min    Activity Tolerance  Patient tolerated treatment well    Behavior During Therapy  [facility] for tasks assessed/performed       Past Medical History:  Diagnosis Date  . Hypertension   . Thyroid disease     Past Surgical History:  Procedure Laterality Date  . IR CT HEAD LTD  07/20/2019  . IR PERCUTANEOUS ART THROMBECTOMY/INFUSION INTRACRANIAL INC DIAG ANGIO  07/20/2019  . RADIOLOGY WITH ANESTHESIA N/A 07/20/2019   Procedure: IR WITH ANESTHESIA;  Surgeon: Luanne Bras, MD;  Location: [facility];  Service: Radiology;  Laterality: N/A;    There were no vitals filed for this visit.   Subjective Assessment - 08/14/19 1112    Subjective  Per interpretor and daughter in law: She is really struggling with speech the most.  OT notes mild weakness in RUE.  Daughter in law reports previous surgery on left leg that causes her to have a little limp that is not new.  She also reports she sees pt somewhat more off balance.    Patient is accompained by:  Family member;Interpreter    Limitations  Walking;House hold activities    Patient Stated Goals  Per interpretor:  improve speech and walking    Currently in Pain?  No/denies         [facility] PT Assessment - 08/14/19 1117      Assessment   Medical Diagnosis  L MCA CVA    Referring Provider (PT)  Alysia Penna, MD    Onset Date/Surgical  Date  07/19/19    Hand Dominance  Right    Prior Therapy  inpt rehab for PT, OT and ST      Precautions   Precautions  Fall      Balance Screen   Has the patient fallen in the past 6 months  Yes    How many times?  3   in [place] (timeline unsure)   Has the patient had a decrease in activity level because of a fear of falling?   Yes    Is the patient reluctant to leave their home because of a fear of falling?   Yes      [place]  Private residence    Living Arrangements  Children    Available Help at Discharge  Family;Available 24 hours/day    Type of Home  Mobile home    Home Access  Stairs to enter    Entrance Stairs-Number of Steps  7    Entrance Stairs-Rails  Right;Left;Cannot reach both    Home Layout  One level    Home Equipment  Tub bench      Prior Function   Level of Independence  Independent    Leisure  cooking, likes to clean, likes to garden, likes to crochet  Cognition   Overall Cognitive Status  Difficult to assess    Area of Impairment  --   difficult to assess due to language/aphasia/apraxia     Sensation   Light Touch  Appears Intact    Hot/Cold  Appears Intact    Proprioception  Appears Intact      Coordination   Gross Motor Movements are Fluid and Coordinated  Yes    Fine Motor Movements are Fluid and Coordinated  Yes    Coordination and Movement Description  WFL in LEs    Heel Shin Test  [facility]       Posture/Postural Control   Posture/Postural Control  Postural limitations    Postural Limitations  Forward head;Rounded Shoulders      ROM / Strength   AROM / PROM / Strength  Strength      AROM   Overall AROM   Within functional limits for tasks performed    Overall AROM Comments  BLEs      Strength   Overall Strength  Deficits    Overall Strength Comments  overall 4/5 to 5/5, except R hip flex 3+/5, suspect hip abd weakness due to compensations during gait.        Transfers   Transfers  Sit to Stand;Stand to  Sit    Sit to Stand  6: Modified independent (Device/Increase time)    Five time sit to stand comments   15.56 secs without UE support     Stand to Sit  6: Modified independent (Device/Increase time)      Ambulation/Gait   Ambulation/Gait  Yes    Ambulation/Gait Assistance  4: Min guard;5: Supervision    Ambulation/Gait Assistance Details  min/guard to guide from a directional standpoint.      Ambulation Distance (Feet)  400 Feet    Assistive device  None    Gait Pattern  Step-through pattern;Decreased stride length;Decreased arm swing - right;Decreased arm swing - left;Narrow base of support;Trunk flexed    Ambulation Surface  Level;Indoor    Gait velocity  3.11 ft/sec without AD    Stairs  Yes    Stairs Assistance  5: Supervision    Stair Management Technique  Two rails;Alternating pattern;Forwards    Number of Stairs  8    Height of Stairs  6      Functional Gait  Assessment   Gait assessed   Yes    Gait Level Surface  Walks 20 ft in less than 7 sec but greater than 5.5 sec, uses assistive device, slower speed, mild gait deviations, or deviates 6-10 in outside of the 12 in walkway width.    Change in Gait Speed  Able to change speed, demonstrates mild gait deviations, deviates 6-10 in outside of the 12 in walkway width, or no gait deviations, unable to achieve a major change in velocity, or uses a change in velocity, or uses an assistive device.    Gait with Horizontal Head Turns  Performs head turns smoothly with slight change in gait velocity (eg, minor disruption to smooth gait path), deviates 6-10 in outside 12 in walkway width, or uses an assistive device.    Gait with Vertical Head Turns  Performs task with slight change in gait velocity (eg, minor disruption to smooth gait path), deviates 6 - 10 in outside 12 in walkway width or uses assistive device    Gait and Pivot Turn  Pivot turns safely in greater than 3 sec and stops with no loss of balance, or  pivot turns safely within 3  sec and stops with mild imbalance, requires small steps to catch balance.    Step Over Obstacle  Is able to step over one shoe box (4.5 in total height) but must slow down and adjust steps to clear box safely. May require verbal cueing.    Gait with Narrow Base of Support  Ambulates less than 4 steps heel to toe or cannot perform without assistance.    Gait with Eyes Closed  Walks 20 ft, slow speed, abnormal gait pattern, evidence for imbalance, deviates 10-15 in outside 12 in walkway width. Requires more than 9 sec to ambulate 20 ft.    Ambulating Backwards  Walks 20 ft, slow speed, abnormal gait pattern, evidence for imbalance, deviates 10-15 in outside 12 in walkway width.    Steps  Alternating feet, must use rail.    Total Score  15    FGA comment:  < 19 = high risk fall                Objective measurements completed on examination: See above findings.              PT Education - 08/14/19 1424    Education Details  Education to pt/daughter in Sports coach via interpreter about evaluation results, POC, goals    Person(s) Educated  Patient;Child(ren)    Methods  Explanation    Comprehension  Verbalized understanding;Verbal cues required       PT Short Term Goals - 08/14/19 1433      PT SHORT TERM GOAL #1   Title  Pt will be independent with initial HEP in order to indiciate improved functional mobility and decreased fall risk.  (Target Date: 09/13/19)    Time  4    Period  Weeks    Status  New    Target Date  09/13/19      PT SHORT TERM GOAL #2   Title  Pt will improve FGA to >/=19/30 in order to indicate decreased fall risk.    Time  4    Period  Weeks    Status  New      PT SHORT TERM GOAL #3   Title  Pt will improve 5TSS to </=13 secs without UE support in order to indicate improved functional strength.    Time  4    Period  Weeks    Status  New      PT SHORT TERM GOAL #4   Title  Pt will improve gait speed to >/=3.71 ft/sec with no overt LOB/sway in order  to indicate more normal gait speed.    Time  4    Period  Weeks    Status  New      PT SHORT TERM GOAL #5   Title  Pt will ambulate up/down stairs with single rail in alternating pattern at mod I level in order to indicate safe home entry/exit.    Time  4    Period  Weeks    Status  New        PT Long Term Goals - 08/14/19 1437      PT LONG TERM GOAL #1   Title  Pt will be independent with final HEP (with cues from family as needed) in order to indicate improved functional mobility and decreased fall risk.  (Target Date: 10/13/19)    Time  8    Period  Weeks    Status  New    Target Date  10/13/19      PT LONG TERM GOAL #2   Title  Pt will improve FGA to >/=23/30 in order to indicate decreased fall risk.    Time  8    Period  Weeks    Status  New      PT LONG TERM GOAL #3   Title  Pt will ambulate >1000' over varying outdoor surfaces at mod I (S from a cognitive standpoint) level while scanning environment in order to indicate safe community mobility.    Time  8    Period  Weeks    Status  New      PT LONG TERM GOAL #4   Title  Pt will perform simulated gardening tasks at mod I level in order to return to leisure activities.    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  Note history of HTN, afib, HLD and DJD of knee.  Upon PT evaluation, note mild R sided weakness (more so in hip), gait speed of 3.11 ft/sec without AD which is below normal gait speed, 5TSS time of 15.56 secs without UE support indicative of decrease functional strength and fall risk, and FGA score of 15/30 indicative of high fall risk.  Scoring FGA was difficult due to cognitive/lingual deficits, however do believe she is at least a moderate fall risk.    Personal Factors and  Comorbidities  Comorbidity 3+;Age    Comorbidities  see above    Examination-Activity Limitations  Carry;Dressing;Stand;Lift;Stairs;Squat;Physicist, medical Activity;Laundry;Shop;Yard Work    Merchant navy officer  Evolving/Moderate complexity    Clinical Decision Making  Moderate    Rehab Potential  Excellent    PT Frequency  2x / week    PT Duration  8 weeks    PT Treatment/Interventions  ADLs/Self Care Home Management;Aquatic Therapy;Gait training;Stair training;Functional mobility training;Therapeutic activities;Therapeutic exercise;Balance training;Neuromuscular re-education;Patient/family education;Passive range of motion;Vestibular    PT Next Visit Plan  Initiate HEP for high level balance and R LE strength (esp hip), outdoor gait, wider BOS with gait, turns    Consulted and Agree with Plan of Care  Patient;Family member/caregiver    Family Member Consulted  daughter in law via interpreter       Patient will benefit from skilled therapeutic intervention in order to improve the following deficits and impairments:  Abnormal gait, Decreased activity tolerance, Decreased balance, Decreased cognition, Decreased endurance, Decreased mobility, Decreased strength, Impaired perceived functional ability, Improper body mechanics, Postural dysfunction  Visit Diagnosis: Unsteadiness on feet  Muscle weakness (generalized)  Hemiplegia and hemiparesis following cerebral infarction affecting right dominant side (HCC)  Other abnormalities of gait and mobility     Problem List Patient Active Problem List   Diagnosis Date Noted  . Renal failure 07/25/2019  . Prediabetes 07/25/2019  . Left middle cerebral artery stroke ([place]) 07/25/2019  . 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 Dysphagia 07/25/2019  . 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 Acute respiratory failure ([place])   . Tachycardia-bradycardia syndrome ([place])   . Atrial fibrillation with  RVR ([place])   . Hypertension 07/23/2015  . Hyperlipidemia 07/23/2015  . 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 Hypothyroidism 01/25/2014    Cameron Sprang, PT, MPT [facility] [address] [place], [place], [address] Phone: [phone]   Fax:  [phone] 08/14/19, 2:50 PM  Name: Jamie Mitchell MRN: [id number] Date of Birth: August 27, 1945

## 2019-08-15 NOTE — Therapy (Signed)
[facility] [address] [place], [place], [address] Phone: [phone]   Fax:  [phone]  Speech Language Pathology Evaluation  Patient Details  Name: Jamie Mitchell MRN: [id number] Date of Birth: Jun 18, 1945 Referring Provider (SLP): Alysia Penna, MD   Encounter Date: 08/14/2019  End of Session - 08/14/19 2348    Visit Number  1    Number of Visits  17    Date for SLP Re-Evaluation  10/13/19    SLP Start Time  47    SLP Stop Time   1103    SLP Time Calculation (min)  43 min    Activity Tolerance  Patient tolerated treatment well       Past Medical History:  Diagnosis Date  . Hypertension   . Thyroid disease     Past Surgical History:  Procedure Laterality Date  . IR CT HEAD LTD  07/20/2019  . IR PERCUTANEOUS ART THROMBECTOMY/INFUSION INTRACRANIAL INC DIAG ANGIO  07/20/2019  . RADIOLOGY WITH ANESTHESIA N/A 07/20/2019   Procedure: IR WITH ANESTHESIA;  Surgeon: Luanne Bras, MD;  Location: [facility];  Service: Radiology;  Laterality: N/A;    There were no vitals filed for this visit.      SLP Evaluation [facility] - 08/14/19 1413      SLP Visit Information   SLP Received On  08/14/19    Referring Provider (SLP)  Alysia Penna, MD    Onset Date  07-19-19    Medical Diagnosis  CVA      Subjective   Patient/Family Stated Goal  Improve talking      General Information   HPI  74 yo limited English speaking F who presented with right sided weakness on 07-19-19- found to have acute left MCA territory infarct and small chronic lt frontal infarcts. Necessary to perform emergent thrombectomy.  Pt extubated 2/28, CIR 07-26-19 to 08-03-19; d/c on dys III/thin with slow rate and alternate bite/sip.         Balance Screen   Has the patient fallen in the past 6 months  No      Prior Functional Status   Cognitive/Linguistic Baseline  Within functional limits    Type of Home  Mobile home     Lives  With  Spouse    Available Support  Family    Vocation  Retired      Associate Professor   Overall Cognitive Status  Difficult to assess   Pt appeared with WNL attention and emergent awareness   Difficult to assess due to  Non-English speaking;Impaired communication      Auditory Comprehension   Overall Auditory Comprehension  Impaired    Conversation  Simple    Other Conversation Comments  She followed simple verbal commands and demonstrated understanding of simple questions ("What is your name?", "What is your address?", "Who came wiht you today?", "Who is in the picture?").    Overall Auditory Comprehension Comments  Pt auditory comprehension unable to be fully assessed today due to time constraints.      Verbal Expression   Overall Verbal Expression  Impaired    Initiation  Impaired    Automatic Speech  Name;Month of year    Level of Generative/Spontaneous Verbalization  Phrase;Sentence    Repetition  Impaired    Level of Impairment  Word level    Effective Techniques  Written cues    Other Verbal Expression Comments  Picture description was very basic ("I see ______", "A  ____") and very  similar sentence structure throughout. Interpreter stated pt's speech was halting and "not fluent". 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      Motor Speech   Overall Motor Speech  Impaired    Respiration  Within functional limits    Phonation  Normal    Resonance  Within functional limits    Articulation  --    Level of Impairment  Word    Intelligibility  Intelligible    Motor Planning  Impaired    Level of Impairment  Word    Motor Speech Errors  Inconsistent;Unaware   "Tufina"/Blimy, "kesquive"/esquivel     Standardized Assessments   Standardized Assessments   Western Aphasia Battery revised   portions                     SLP Education - 08/14/19 2347    Education Details  general eval results, what is aphasia, ST frequency and duration    Person(s) Educated  Patient    Methods   Explanation;Handout    Comprehension  Verbalized understanding;Verbal cues required;Need further instruction       SLP Short Term Goals - 08/14/19 2353      SLP SHORT TERM GOAL #1   Title  pt will produce sentences describing pictures with MLU = 5.5 (at least 25 utterances) in 2 sessions    Time  4    Period  Weeks    Status  New      SLP SHORT TERM GOAL #2   Title  pt will communicate basic wants and needs functionally (reported by pt and/or family) with family with min questioning cues over 3 sessions    Time  4    Period  Weeks    Status  New      SLP SHORT TERM GOAL #3   Title  pt and/or family will indicate 3 overt s/sx aspiration PNA with modified independence    Time  4    Period  Weeks    Status  New       SLP Long Term Goals - 08/14/19 2357      SLP LONG TERM GOAL #1   Title  pt will participate in simple conversation with MLU=7.0    Time  8    Period  Weeks   or 17 sessions, for all LTGs   Status  New      SLP LONG TERM GOAL #2   Title  pt will practice speech and language tasks at home at least 4 days/week at least 6 weeks    Time  [place] - 08/14/19 2349    Clinical Impression Statement  Pt is limited [place] speaker (therefore eval tasks were completed via live interpreter) who presents with receptive and expressive aphasia, 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 Eval to be completed fully in next one-two sessions and results provided qualitatively and not quantitiatively as WAB-R is not normed on native [place] speakers. Further goals to be added after completion. Dtr in law states pt has worked on Lobbyist tasks with son at home. Pt also has dys III/thin currently and reports no difficulties with this at home. Pt ?able cognitive linquistic skills - testing to be completed as needed. Difficult to asses currently due to time constraints. Further testing to be completed if necessary. Pt would benefit from skilled ST  targeting receptive and expressive language and verbal skills.  Speech Therapy Frequency  2x / week    Duration  --   8 weeks or 17 total visits   Treatment/Interventions  Aspiration precaution training;Pharyngeal strengthening exercises;Diet toleration management by SLP;Trials of upgraded texture/liquids;Language facilitation;Cueing hierarchy;SLP instruction and feedback;Internal/external aids;Compensatory strategies;Patient/family education;Cognitive reorganization;Functional tasks;Environmental controls;Multimodal communcation approach    Potential to Achieve Goals  Fair    Potential Considerations  Severity of impairments;Cooperation/participation level    Consulted and Agree with Plan of Care  Patient       Patient will benefit from skilled therapeutic intervention in order to improve the following deficits and impairments:   Aphasia  Verbal apraxia  Cognitive communication deficit    Problem List Patient Active Problem List   Diagnosis Date Noted  . Renal failure 07/25/2019  . Prediabetes 07/25/2019  . Left middle cerebral artery stroke ([place]) 07/25/2019  . 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 Dysphagia 07/25/2019  . 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 Acute respiratory failure ([place])   . Tachycardia-bradycardia syndrome ([place])   . Atrial fibrillation with RVR ([place])   . Hypertension 07/23/2015  . Hyperlipidemia 07/23/2015  . 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 Hypothyroidism 01/25/2014    [facility] ,[place], [place]  08/15/2019, 12:03 AM  [facility] [address] [place], [place], [address] Phone: [phone]   Fax:  [phone]  Name: Tyreka Szeliga MRN: [id number] Date of Birth: 04/05/1946

## 2019-08-16 ENCOUNTER — Other Ambulatory Visit: Payer: Self-pay

## 2019-08-16 ENCOUNTER — Ambulatory Visit (INDEPENDENT_AMBULATORY_CARE_PROVIDER_SITE_OTHER): Payer: Medicare Other | Admitting: Cardiology

## 2019-08-16 ENCOUNTER — Telehealth: Payer: Self-pay | Admitting: Radiology

## 2019-08-16 ENCOUNTER — Encounter: Payer: Self-pay | Admitting: Cardiology

## 2019-08-16 VITALS — BP 122/72 | HR 46 | Ht 62.0 in | Wt 152.8 lb

## 2019-08-16 DIAGNOSIS — I4891 Unspecified atrial fibrillation: Secondary | ICD-10-CM | POA: Diagnosis not present

## 2019-08-16 DIAGNOSIS — Z79899 Other long term (current) drug therapy: Secondary | ICD-10-CM | POA: Diagnosis not present

## 2019-08-16 DIAGNOSIS — Z23 Encounter for immunization: Secondary | ICD-10-CM | POA: Diagnosis not present

## 2019-08-16 DIAGNOSIS — I455 Other specified heart block: Secondary | ICD-10-CM

## 2019-08-16 DIAGNOSIS — R7989 Other specified abnormal findings of blood chemistry: Secondary | ICD-10-CM | POA: Diagnosis not present

## 2019-08-16 LAB — T4, FREE: Free T4: 1.53 ng/dL (ref 0.82–1.77)

## 2019-08-16 LAB — TSH: TSH: 11 u[IU]/mL — ABNORMAL HIGH (ref 0.450–4.500)

## 2019-08-16 MED ORDER — AMIODARONE HCL 200 MG PO TABS: 200.0000 mg | ORAL_TABLET | Freq: Every day | ORAL | 0 refills | Status: DC

## 2019-08-16 NOTE — Telephone Encounter (Signed)
Enrolled patient for a 14 day Zio monitor to be mailed to patients home. Spanish instructions were requested.

## 2019-08-16 NOTE — Patient Instructions (Signed)
Medication Instructions:  DECREASE amiodarone to 200 mg ONCE daily  *If you need a refill on your cardiac medications before your next appointment, please call your pharmacy*   Lab Work: Today (TSH, freeT4)  If you have labs (blood work) drawn today and your tests are completely normal, you will receive your results only by: [name] MyChart Message (if you have MyChart) OR . A paper copy in the mail If you have any lab test that is abnormal or we need to change your treatment, we will call you to review the results.   Testing/Procedures:  [facility]- Long Term Monitor Instructions   Your physician has requested you wear your ZIO patch monitor 14 days.   This is a single patch monitor.  [employer] supplies one patch monitor per enrollment.  Additional stickers are not available.   Please do not apply patch if you will be having a Nuclear Stress Test, Echocardiogram, Cardiac CT, MRI, or Chest Xray during the time frame you would be wearing the monitor. The patch cannot be worn during these tests.  You cannot remove and re-apply the ZIO XT patch monitor.   Your ZIO patch monitor will be sent USPS Priority mail from [facility] directly to your home address. The monitor may also be mailed to a PO BOX if home delivery is not available.   It may take 3-5 days to receive your monitor after you have been enrolled.   Once you have received you monitor, please review enclosed instructions.  Your monitor has already been registered assigning a specific monitor serial # to you.   Applying the monitor   Shave hair from upper left chest.   Hold abrader disc by orange tab.  Rub abrader in 40 strokes over left upper chest as indicated in your monitor instructions.   Clean area with 4 enclosed alcohol pads .  Use all pads to assure are is cleaned thoroughly.  Let dry.   Apply patch as indicated in monitor instructions.  Patch will be place under collarbone on left side of chest with arrow pointing  upward.   Rub patch adhesive wings for 2 minutes.Remove white label marked "1".  Remove white label marked "2".  Rub patch adhesive wings for 2 additional minutes.   While looking in a mirror, press and release button in center of patch.  A small green light will flash 3-4 times .  This will be your only indicator the monitor has been turned on.     Do not shower for the first 24 hours.  You may shower after the first 24 hours.   Press button if you feel a symptom. You will hear a small click.  Record Date, Time and Symptom in the Patient Log Book.   When you are ready to remove patch, follow instructions on last 2 pages of Patient Log Book.  Stick patch monitor onto last page of Patient Log Book.   Place Patient Log Book in Pillow box.  Use locking tab on box and tape box closed securely.  The [employer] and [employer] has [employer] on it.  Please place in mailbox as soon as possible.  Your physician should have your test results approximately 7 days after the monitor has been mailed back to [facility].   Call [place] at [phone] if you have questions regarding your ZIO XT patch monitor.  Call them immediately if you see an orange light blinking on your monitor.   If your monitor  falls off in less than 4 days contact our Monitor department at [phone].  If your monitor becomes loose or falls off after 4 days call [employer] at [phone] for suggestions on securing your monitor.    Follow-Up: At [facility], you and your health needs are our priority.  As part of our continuing mission to provide you with exceptional heart care, we have created designated Provider Care Teams.  These Care Teams include your primary Cardiologist (physician) and Advanced Practice Providers (APPs -  Physician Assistants and Nurse Practitioners) who all work together to provide you with the care you need, when you need it.  We recommend signing up for the patient portal called  "MyChart".  Sign up information is provided on this After Visit Summary.  MyChart is used to connect with patients for Virtual Visits (Telemedicine).  Patients are able to view lab/test results, encounter notes, upcoming appointments, etc.  Non-urgent messages can be sent to your provider as well.   To learn more about what you can do with MyChart, go to [email or web address].    Your next appointment:   3 month(s)  The format for your next appointment:   In Person  Provider:   Oswaldo Milian, MD

## 2019-08-18 ENCOUNTER — Other Ambulatory Visit: Payer: Self-pay

## 2019-08-18 ENCOUNTER — Encounter: Payer: Self-pay | Admitting: Occupational Therapy

## 2019-08-18 ENCOUNTER — Ambulatory Visit: Payer: Medicare Other | Admitting: Occupational Therapy

## 2019-08-18 ENCOUNTER — Ambulatory Visit: Payer: Medicare Other

## 2019-08-18 VITALS — BP 102/63

## 2019-08-18 DIAGNOSIS — M6281 Muscle weakness (generalized): Secondary | ICD-10-CM | POA: Diagnosis not present

## 2019-08-18 DIAGNOSIS — R2689 Other abnormalities of gait and mobility: Secondary | ICD-10-CM

## 2019-08-18 DIAGNOSIS — R2681 Unsteadiness on feet: Secondary | ICD-10-CM | POA: Diagnosis not present

## 2019-08-18 DIAGNOSIS — R1312 Dysphagia, oropharyngeal phase: Secondary | ICD-10-CM

## 2019-08-18 DIAGNOSIS — R41841 Cognitive communication deficit: Secondary | ICD-10-CM

## 2019-08-18 DIAGNOSIS — R4701 Aphasia: Secondary | ICD-10-CM | POA: Diagnosis not present

## 2019-08-18 DIAGNOSIS — R482 Apraxia: Secondary | ICD-10-CM

## 2019-08-18 DIAGNOSIS — I69318 Other symptoms and signs involving cognitive functions following cerebral infarction: Secondary | ICD-10-CM | POA: Diagnosis not present

## 2019-08-18 DIAGNOSIS — I69351 Hemiplegia and hemiparesis following cerebral infarction affecting right dominant side: Secondary | ICD-10-CM

## 2019-08-18 NOTE — Patient Instructions (Signed)
SLP instructed pt DIL to work with pt on simple divergent naming tasks - 5-7 colors, drinks, furniture, body parts, fruits, veggies.

## 2019-08-18 NOTE — Patient Instructions (Signed)
1. Grip Strengthening (Resistive Putty)   Squeeze putty using thumb and all fingers. Repeat _20___ times. Do __2__ sessions per day.   2. Roll putty into tube on table and pinch between each finger and thumb x 10 reps each. (can do ring and small finger together)     Copyright  VHI. All rights reserved.   

## 2019-08-18 NOTE — Therapy (Signed)
[facility] [address] [place], [place], [address] Phone: [phone]   Fax:  [phone]  Speech Language Pathology Treatment  Patient Details  Name: Jamie Mitchell MRN: [id number] Date of Birth: March 08, 1946 Referring Provider (SLP): Alysia Penna, MD   Encounter Date: 08/18/2019  End of Session - 08/18/19 1717    Visit Number  2    Number of Visits  17    Date for SLP Re-Evaluation  11/17/19    SLP Start Time  1151    SLP Stop Time   1231    SLP Time Calculation (min)  40 min    Activity Tolerance  Patient tolerated treatment well       Past Medical History:  Diagnosis Date  . Hypertension   . Thyroid disease     Past Surgical History:  Procedure Laterality Date  . IR CT HEAD LTD  07/20/2019  . IR PERCUTANEOUS ART THROMBECTOMY/INFUSION INTRACRANIAL INC DIAG ANGIO  07/20/2019  . RADIOLOGY WITH ANESTHESIA N/A 07/20/2019   Procedure: IR WITH ANESTHESIA;  Surgeon: Luanne Bras, MD;  Location: [facility];  Service: Radiology;  Laterality: N/A;    There were no vitals filed for this visit.         ADULT SLP TREATMENT - 08/18/19 1349      General Information   Behavior/Cognition  Alert;Cooperative;Pleasant mood;Distractible;Requires cueing;Decreased sustained attention      Treatment Provided   Treatment provided  Cognitive-Linquistic      Cognitive-Linquistic Treatment   Treatment focused on  Aphasia    Skilled Treatment  SLP completed more of the Western Aphasia Battery (WAB-R) today. Pt mostly made phonemic paraphasic errors or apraxic errors - difficult to say for this non-native spanish speaker. She also demonstrated perseveration on naming tasks. Measurement of pt's auditory comprehension was complicated by pt's reduced sustained attention/selective attention - sLP had to abandon the auditory comprehension portion (figure, object, finger, body part ID) because of decr'd attention.       Assessment / Recommendations / Plan   Plan  Continue with current plan of care;Goals updated      Progression Toward Goals   Progression toward goals  Progressing toward goals       SLP Education - 08/18/19 1717    Education Details  home tasks- divergent naming    Person(s) Educated  Patient    Methods  Explanation;Demonstration    Comprehension  Verbalized understanding;Need further instruction       SLP Short Term Goals - 08/18/19 1720      SLP SHORT TERM GOAL #1   Title  pt will produce sentences describing pictures with MLU = 5.5 (at least 25 utterances) in 2 sessions    Time  4    Period  Weeks    Status  On-going      SLP SHORT TERM GOAL #2   Title  pt will communicate basic wants and needs functionally (reported by pt and/or family) with family with min questioning cues over 3 sessions    Time  4    Period  Weeks    Status  On-going      SLP SHORT TERM GOAL #3   Title  pt and/or family will indicate 3 overt s/sx aspiration PNA with modified independence    Time  4    Period  Weeks    Status  On-going      SLP SHORT TERM GOAL #4   Title  pt will follow commands  with superlatives or comparatives 80% with occasional min-mod A in 2 sessions    Time  4    Period  Weeks    Status  New       SLP Long Term Goals - 08/18/19 1721      SLP LONG TERM GOAL #1   Title  pt will participate in simple conversation with MLU=7.0    Time  8    Period  Weeks   or 17 sessions, for all LTGs   Status  On-going      SLP LONG TERM GOAL #2   Title  pt will practice speech and language tasks at home at least 4 days/week at least 6 weeks    Time  8    Period  Weeks    Status  On-going      SLP LONG TERM GOAL #3   Title  pt will demo understanding of 10 minutes simple-mod complex conversation with modified indpendence    Time  8    Period  Weeks    Status  New      SLP LONG TERM GOAL #4   Title  pt will demonstrate attention appropriate for efficacious completion of  therapy tasks for 40 minutes in 8 sessions    Time  8    Period  Weeks    Status  New       Plan - 08/18/19 1718    Clinical Impression Statement  Pt is limited [place] speaker (therefore eval tasks were completed via live interpreter) who presents with receptive and expressive aphasia, 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 Aud comp section had to be abandoned today due to decr'd sustained/selective attention (internal distraction). Eval to be fully completed in next session and results provided qualitatively and not quantitiatively as WAB-R is not normed on native [place] speakers. Further goals to be added after completion. Pt also has dys III/thin currently and reports no difficulties with this at home. Pt ?able cognitive linquistic skills - testing to be completed as needed. Difficult to asses currently due to time constraints. Further testing to be completed if necessary. Pt would benefit from skilled ST targeting receptive and expressive language and verbal skills.    Speech Therapy Frequency  2x / week    Duration  --   8 weeks or 17 total visits   Treatment/Interventions  Aspiration precaution training;Pharyngeal strengthening exercises;Diet toleration management by SLP;Trials of upgraded texture/liquids;Language facilitation;Cueing hierarchy;SLP instruction and feedback;Internal/external aids;Compensatory strategies;Patient/family education;Cognitive reorganization;Functional tasks;Environmental controls;Multimodal communcation approach    Potential to Achieve Goals  Fair    Potential Considerations  Severity of impairments;Cooperation/participation level    Consulted and Agree with Plan of Care  Patient       Patient will benefit from skilled therapeutic intervention in order to improve the following deficits and impairments:   Aphasia  Verbal apraxia  Cognitive communication deficit  Dysphagia, oropharyngeal phase    Problem List Patient Active Problem List   Diagnosis  Date Noted  . Renal failure 07/25/2019  . Prediabetes 07/25/2019  . 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 Dysphagia 07/25/2019  . 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 Acute respiratory failure ([facility])   . 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 Atrial fibrillation with RVR ([facility])   . Hypertension 07/23/2015  . Hyperlipidemia 07/23/2015  . DJD (degenerative joint disease) of  knee 01/25/2014  . Hypothyroidism 01/25/2014    [facility] ,[place], [place]  08/18/2019, 5:24 PM  [facility] [address] [place], [place], [address] Phone: [phone]   Fax:  [phone]   Name: Jamie Mitchell MRN: [id number] Date of Birth: 04-22-1946

## 2019-08-18 NOTE — Therapy (Signed)
[facility] [address] [place], [place], [address] Phone: [phone]   Fax:  [phone]  Occupational Therapy Treatment  Patient Details  Name: Jamie Mitchell MRN: [id number] Date of Birth: 10-29-1945 Referring Provider (OT): Dr. Letta Pate   Encounter Date: 08/18/2019  OT End of Session - 08/18/19 1145    Visit Number  2    Number of Visits  9    Date for OT Re-Evaluation  09/11/19    Authorization Type  medicare - will need PN every 10th visit    Authorization Time Period  90 days - 11/06/2019    Authorization - Visit Number  2    Authorization - Number of Visits  10    OT Start Time  1108    OT Stop Time  1146    OT Time Calculation (min)  38 min    Activity Tolerance  Patient tolerated treatment well       Past Medical History:  Diagnosis Date  . Hypertension   . Thyroid disease     Past Surgical History:  Procedure Laterality Date  . IR CT HEAD LTD  07/20/2019  . IR PERCUTANEOUS ART THROMBECTOMY/INFUSION INTRACRANIAL INC DIAG ANGIO  07/20/2019  . RADIOLOGY WITH ANESTHESIA N/A 07/20/2019   Procedure: IR WITH ANESTHESIA;  Surgeon: Luanne Bras, MD;  Location: [facility];  Service: Radiology;  Laterality: N/A;    Vitals:   08/18/19 1138  BP: 102/63    Subjective Assessment - 08/18/19 1113    Subjective   Pt is aphasic and is unable to share info at this time.    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  No/denies                   Treatment: Standing to fold towels and place graded clothespins on antennae with RUE, x 8 mins min v.c for RUE use. Pt appears reluctant to use RUE and tried to open putty with LUE.        OT Treatment/Education - 08/18/19 1415    Education Details   Reveiwed goals via interpreter, pt/ dtr verbalize understanding, red putty exercises for grip and pinch, min v.c and demonstration, discussed importance of using RUE functionally for light tasks such as folding laundry, washing dishes or making tortillas,. Pt was cautioned against lifting heavy or breakable items    Person(s) Educated  Patient;Child(ren);Other (comment)   interpreter   Methods  Explanation;Demonstration;Verbal cues;Handout    Comprehension  Verbalized understanding;Returned demonstration;Verbal cues required          OT Long Term Goals - 08/14/19 1257      OT LONG TERM GOAL #1   Title  Pt and family will be mod I with HEP for grip strength and home activities program for balance, increased independence and activity tolerance - 09/11/2019    Status  New      OT LONG TERM GOAL #2   Title  Pt will demonstrate improved grip strength in R hand by at least 4 pounds to assist with home mgmt tasks    Status  New      OT LONG TERM GOAL #3   Title  Pt will require no more than supervision to cook  simple familiar hot meal    Status  New      OT LONG TERM GOAL #4   Title  Pt will demonstrate ability to complete at least 40 minutes of functional familiar activity with no more than one rest break    Status  New            Plan - 08/18/19 1146    Clinical Impression Statement  Pt is progressing towards goals however she requires v.c to use RUE functionally.    OT Occupational Profile and History  Detailed Assessment- Review of Records and additional review of physical, cognitive, psychosocial history related to current functional performance    Occupational performance deficits (Please refer to evaluation for details):  ADL's;IADL's;Leisure;Social Participation;Rest and Sleep    Body Structure / Function / Physical Skills  ADL;Balance;Endurance;IADL;Mobility;Strength;UE functional use    Rehab Potential  Good    Clinical Decision Making  Several treatment options, min-mod  task modification necessary    Comorbidities Affecting Occupational Performance:  May have comorbidities impacting occupational performance    Modification or Assistance to Complete Evaluation   Min-Moderate modification of tasks or assist with assess necessary to complete eval    OT Frequency  2x / week    OT Duration  4 weeks    OT Treatment/Interventions  Self-care/ADL training;Aquatic Therapy;Therapeutic exercise;Neuromuscular education;Energy conservation;Functional Mobility Training;Patient/family education;Cognitive remediation/compensation;Therapeutic activities;Balance training    Plan  review goals and POC, initiate HEP for grip strength as able with language barrier (aphasia), address balance, observe simple cold meal prep    Consulted and Agree with Plan of Care  Patient;Family member/caregiver    Family Member Consulted  dtr in Copy       Patient will benefit from skilled therapeutic intervention in order to improve the following deficits and impairments:   Body Structure / Function / Physical Skills: ADL, Balance, Endurance, IADL, Mobility, Strength, UE functional use       Visit Diagnosis: Muscle weakness (generalized)  Hemiplegia and hemiparesis following cerebral infarction affecting right dominant side (HCC)  Other abnormalities of gait and mobility    Problem List Patient Active Problem List   Diagnosis Date Noted  . Renal failure 07/25/2019  . Prediabetes 07/25/2019  . 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 Dysphagia 07/25/2019  . 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 Acute respiratory failure ([facility])   . 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 Atrial fibrillation with RVR ([facility])   . Hypertension 07/23/2015  . Hyperlipidemia 07/23/2015  . DJD (degenerative joint disease) of knee  01/25/2014  . Hypothyroidism 01/25/2014    Cortavious Nix 08/18/2019, 2:17 PM  [facility] [address] [place], [place], [address] Phone: [phone]   Fax:  [phone]  Name: Miah Breton MRN: [id number] Date of Birth: Aug 02, 1945

## 2019-08-20 ENCOUNTER — Other Ambulatory Visit (INDEPENDENT_AMBULATORY_CARE_PROVIDER_SITE_OTHER): Payer: Medicare Other

## 2019-08-20 DIAGNOSIS — I4891 Unspecified atrial fibrillation: Secondary | ICD-10-CM

## 2019-08-21 ENCOUNTER — Encounter: Payer: Self-pay | Admitting: Rehabilitation

## 2019-08-21 ENCOUNTER — Ambulatory Visit: Payer: Medicare Other | Attending: Family Medicine | Admitting: Physician Assistant

## 2019-08-21 ENCOUNTER — Ambulatory Visit: Payer: Medicare Other

## 2019-08-21 ENCOUNTER — Other Ambulatory Visit: Payer: Self-pay

## 2019-08-21 ENCOUNTER — Telehealth: Payer: Self-pay | Admitting: Family Medicine

## 2019-08-21 ENCOUNTER — Ambulatory Visit: Payer: Medicare Other | Admitting: Rehabilitation

## 2019-08-21 DIAGNOSIS — E038 Other specified hypothyroidism: Secondary | ICD-10-CM

## 2019-08-21 DIAGNOSIS — I63412 Cerebral infarction due to embolism of left middle cerebral artery: Secondary | ICD-10-CM

## 2019-08-21 DIAGNOSIS — R2681 Unsteadiness on feet: Secondary | ICD-10-CM | POA: Diagnosis not present

## 2019-08-21 DIAGNOSIS — M6281 Muscle weakness (generalized): Secondary | ICD-10-CM | POA: Diagnosis not present

## 2019-08-21 DIAGNOSIS — R4701 Aphasia: Secondary | ICD-10-CM

## 2019-08-21 DIAGNOSIS — R2689 Other abnormalities of gait and mobility: Secondary | ICD-10-CM

## 2019-08-21 DIAGNOSIS — R1312 Dysphagia, oropharyngeal phase: Secondary | ICD-10-CM

## 2019-08-21 DIAGNOSIS — R482 Apraxia: Secondary | ICD-10-CM

## 2019-08-21 DIAGNOSIS — I69318 Other symptoms and signs involving cognitive functions following cerebral infarction: Secondary | ICD-10-CM | POA: Diagnosis not present

## 2019-08-21 DIAGNOSIS — I69351 Hemiplegia and hemiparesis following cerebral infarction affecting right dominant side: Secondary | ICD-10-CM

## 2019-08-21 DIAGNOSIS — R41841 Cognitive communication deficit: Secondary | ICD-10-CM

## 2019-08-21 NOTE — Telephone Encounter (Signed)
Called pt / stated her mother in low needs to see her PCP as per cardiologist to f /u thyroid levels and medication. Appt given today .

## 2019-08-21 NOTE — Patient Instructions (Signed)
   Home tasks provided.

## 2019-08-21 NOTE — Patient Instructions (Signed)
Access Code: [email or web address] URL: [email or web address] Date: 08/21/2019 Prepared by: Cameron Sprang  Exercises Walking March - 1 x daily - 7 x weekly - 1 sets - 4 reps Tandem Walking with Counter Support - 1 x daily - 7 x weekly - 1 sets - 4 reps Standing Hip Abduction with Counter Support - 1 x daily - 7 x weekly - 1 sets - 10 reps Romberg Stance Eyes Closed on Foam Pad - 1 x daily - 7 x weekly - 10 reps - 3 sets Romberg Stance on Foam Pad with Head Rotation - 1 x daily - 7 x weekly - 10 reps - 3 sets Romberg Stance with Head Nods on Foam Pad - 1 x daily - 7 x weekly - 10 reps - 3 sets Sit to Stand without Arm Support - 1 x daily - 7 x weekly - 1 sets - 10 reps

## 2019-08-21 NOTE — Patient Instructions (Signed)
Hipotiroidismo Hypothyroidism  El hipotiroidismo ocurre cuando la glndula tiroidea no produce la cantidad suficiente de ciertas hormonas (es hipoactiva). La glndula tiroidea es una pequea glndula ubicada en la parte delantera inferior del cuello, justo delante de la trquea. Esta glndula produce hormonas que ayudan a controlar la forma en la que el cuerpo usa los alimentos para obtener energa (metabolismo) as como tambin la funcin cardaca y la funcin cerebral. Estas hormonas tambin juegan un papel para mantener los huesos fuertes. Cuando la tiroides es hipoactiva, produce muy poca cantidad de las hormonas tiroxina (T4) y triyodotironina (T3). Cules son las causas? Esta afeccin puede ser causada por lo siguiente:  Enfermedad de Hashimoto. Se trata de una enfermedad por la cual el sistema del cuerpo encargado de combatir las enfermedades (sistema inmunitario) ataca la glndula tiroidea. Esta es la causa ms frecuente.  Infecciones virales.  Embarazo.  Ciertos medicamentos.  Defectos congnitos.  Radioterapias anteriores en la cabeza o el cuello para el cncer.  Tratamiento previo con yodo radioactivo.  Exposicin a la radiacin en el ambiente, en el pasado.  Extirpacin quirrgica previa de una parte o de toda la tiroides.  Problemas con una glndula ubicada en el centro del cerebro (hipfisis).  Falta de una cantidad suficiente de yodo en la dieta. Qu incrementa el riesgo? Es ms probable que usted sufra esta afeccin si:  Es mujer.  Tiene antecedentes familiares de afecciones tiroideas.  Usa un medicamento denominado litio.  Toma medicamentos que afectan el sistema inmunitario (inmunosupresores). Cules son los signos o los sntomas? Los sntomas de esta afeccin incluyen los siguientes:  Sensacin de falta de energa (letargo).  Incapacidad para tolerar el fro.  Aumento de peso que no puede explicarse por un cambio en la dieta o en los hbitos de  ejercicio fsico.  Falta de apetito.  Piel seca.  Pelo grueso.  Irregularidades menstruales.  Ralentizacin de los procesos de pensamiento.  Estreimiento.  Tristeza o depresin. Cmo se diagnostica? Esta afeccin se puede diagnosticar en funcin de lo siguiente:  Los sntomas, sus antecedentes mdicos y un examen fsico.  Anlisis de sangre. Tambin puede someterse a estudios por imgenes como una ecografa o una resonancia magntica (RM). Cmo se trata? Esta afeccin se trata con medicamentos que reemplazan las hormonas tiroideas que el cuerpo no produce. Despus de comenzar el tratamiento, pueden pasar varias semanas hasta la desaparicin de los sntomas. Siga estas indicaciones en su casa:  Tome los medicamentos de venta libre y los recetados solamente como se lo haya indicado el mdico.  Si empieza a tomar medicamentos nuevos, infrmele al mdico.  Concurra a todas las visitas de control como se lo haya indicado el mdico. Esto es importante. ? A medida que la afeccin mejora, es posible que haya que modificar las dosis de los medicamentos de hormona tiroidea. ? Tendr que hacerse anlisis de sangre peridicamente, de modo que el mdico pueda controlar la afeccin. Comunquese con un mdico si:  Los sntomas no mejoran con el tratamiento.  Est tomando medicamentos de reemplazo de la tiroides y: ? Suda mucho. ? Tiene temblores. ? Se siente ansioso. ? Baja de peso rpidamente. ? No puede tolerar el calor. ? Tiene cambios emocionales. ? Tiene diarrea. ? Se siente dbil. Solicite ayuda inmediatamente si tiene:  Dolor en el pecho.  Latidos cardacos irregulares.  Latidos cardacos rpidos.  Dificultad para respirar. Resumen  El hipotiroidismo ocurre cuando la glndula tiroidea no produce la cantidad suficiente de ciertas hormonas (es hipoactiva).  Cuando la tiroides   es hipoactiva, produce muy poca cantidad de las hormonas tiroxina (T4) y triyodotironina  (T3).  La causa ms frecuente es la enfermedad de Hashimoto, una enfermedad por la cual el sistema del cuerpo encargado de combatir las enfermedades (sistema inmunitario) ataca la glndula tiroidea. La afeccin tambin puede ser consecuencia de infecciones virales, medicamentos, el embarazo o luego de un tratamiento de radioterapia en la cabeza o el cuello.  Los sntomas pueden incluir aumento de peso, piel seca, estreimiento, sensacin de no tener energa e incapacidad para tolerar el fro.  Esta afeccin se trata con medicamentos que reemplazan las hormonas tiroideas que el cuerpo no produce. Esta informacin no tiene como fin reemplazar el consejo del mdico. Asegrese de hacerle al mdico cualquier pregunta que tenga. Document Revised: 08/21/2017 Document Reviewed: 06/09/2017 Elsevier Patient Education  2020 [employer]  

## 2019-08-21 NOTE — Telephone Encounter (Signed)
Patient daughter in law called saying that the patient went to the ED and was informed that her thyroid was uncontrolled and that she needed to contact her PCP regarding her thyroid. Patient has not been seen since 2018 and has an upcoming appointment on 09/04/19 to establish care. Patient daughter in law is requesting to speak with a nurse regarding patient thyroid. Please f/u

## 2019-08-21 NOTE — Therapy (Signed)
[facility] [address] [place], [place], [address] Phone: [phone]   Fax:  [phone]  Speech Language Pathology Treatment  Patient Details  Name: Jamie Mitchell MRN: [id number] Date of Birth: [birth year] Referring Provider (SLP): Alysia Penna, MD   Encounter Date: 08/21/2019  End of Session - 08/21/19 1239    Visit Number  3    Number of Visits  17    Date for SLP Re-Evaluation  11/17/19    SLP Start Time  1103    SLP Stop Time   1145    SLP Time Calculation (min)  42 min    Activity Tolerance  Patient tolerated treatment well       Past Medical History:  Diagnosis Date  . Hypertension   . Thyroid disease     Past Surgical History:  Procedure Laterality Date  . IR CT HEAD LTD  07/20/2019  . IR PERCUTANEOUS ART THROMBECTOMY/INFUSION INTRACRANIAL INC DIAG ANGIO  07/20/2019  . RADIOLOGY WITH ANESTHESIA N/A 07/20/2019   Procedure: IR WITH ANESTHESIA;  Surgeon: Luanne Bras, MD;  Location: [facility];  Service: Radiology;  Laterality: N/A;    There were no vitals filed for this visit.         ADULT SLP TREATMENT - 08/21/19 1225      General Information   Behavior/Cognition  Alert;Cooperative;Pleasant mood;Distractible;Decreased sustained attention;Requires cueing;Doesn't follow directions      Treatment Provided   Treatment provided  Cognitive-Linquistic      Cognitive-Linquistic Treatment   Treatment focused on  Aphasia;Cognition    Skilled Treatment  Pt chose word from f:3 matching a pic with 65% success. SLP occasionally req'd to cue pt to look at all 3 word choices before making her choice. SLP attempted MOCA 7.3 today but pt could not complete due to decr'd sustained attention. Pt missed one "A" and had 5 false positives with letter ID in an auditory string. Pt drew two extremely small clock faces - size of a quarter and proceeded to attempt to put numbers in - she abandoned the  task twice until SLP drew large clock face - pt then put in 12,1,2,3, and attempts at 4,5, and 6 in the space of where 12 to 4 normally are. Pt called the animal names on two separate occasions when asked to repeat the 5 randcom words.       Assessment / Recommendations / Plan   Plan  Continue with current plan of care      Progression Toward Goals   Progression toward goals  --   pt with language and severe cog-comm deficits      SLP Education - 08/21/19 1239    Education Details  home tasks - how to cue pt    Person(s) Educated  Patient;Caregiver(s)    Methods  Explanation;Demonstration    Comprehension  Verbalized understanding;Need further instruction       SLP Short Term Goals - 08/21/19 1240      SLP SHORT TERM GOAL #1   Title  pt will produce sentences describing pictures with MLU = 5.5 (at least 25 utterances) in 2 sessions    Time  3    Period  Weeks    Status  On-going      SLP SHORT TERM GOAL #2   Title  pt will communicate basic wants and needs functionally (reported by pt and/or family) with family with min questioning cues over 3 sessions    Time  3  Period  Weeks    Status  On-going      SLP SHORT TERM GOAL #3   Title  pt and/or family will indicate 3 overt s/sx aspiration PNA with modified independence    Time  3    Period  Weeks    Status  On-going      SLP SHORT TERM GOAL #4   Title  pt will follow commands with superlatives or comparatives 80% with occasional min-mod A in 2 sessions    Time  3    Period  Weeks    Status  On-going       SLP Long Term Goals - 08/21/19 1240      SLP [place] #1   Title  pt will participate in simple conversation with MLU=7.0    Time  7    Period  Weeks   or 17 sessions, for all LTGs   Status  On-going      SLP LONG TERM GOAL #2   Title  pt will practice speech and language tasks at home at least 4 days/week at least 6 weeks    Time  7    Period  Weeks    Status  On-going      SLP LONG TERM GOAL #3    Title  pt will demo understanding of 10 minutes simple-mod complex conversation with modified indpendence    Time  7    Period  Weeks    Status  On-going      SLP LONG TERM GOAL #4   Title  pt will demonstrate attention appropriate for efficacious completion of therapy tasks for 40 minutes in 8 sessions    Time  8    Period  Weeks    Status  On-going       Plan - 08/21/19 1240    Clinical Impression Statement  Pt is limited [place] speaker (therefore eval tasks were completed via live interpreter) who presents with receptive and expressive aphasia, 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 Aud comp section had to be abandoned today due to decr'd sustained/selective attention (internal distraction). Eval to be fully completed in next session and results provided qualitatively and not quantitiatively as WAB-R is not normed on native [place] speakers. Further goals to be added after completion. Pt also has dys III/thin currently and reports no difficulties with this at home. Pt ?able cognitive linquistic skills - testing to be completed as needed. Difficult to asses currently due to time constraints. Further testing to be completed if necessary. Pt would benefit from skilled ST targeting receptive and expressive language and verbal skills.    Speech Therapy Frequency  2x / week    Duration  --   8 weeks or 17 total visits   Treatment/Interventions  Aspiration precaution training;Pharyngeal strengthening exercises;Diet toleration management by SLP;Trials of upgraded texture/liquids;Language facilitation;Cueing hierarchy;SLP instruction and feedback;Internal/external aids;Compensatory strategies;Patient/family education;Cognitive reorganization;Functional tasks;Environmental controls;Multimodal communcation approach    Potential to Achieve Goals  Fair    Potential Considerations  Severity of impairments;Cooperation/participation level    Consulted and Agree with Plan of Care  Patient        Patient will benefit from skilled therapeutic intervention in order to improve the following deficits and impairments:   Cognitive communication deficit  Aphasia  Verbal apraxia  Dysphagia, oropharyngeal phase    Problem List Patient Active Problem List   Diagnosis Date Noted  . Renal failure 07/25/2019  . Prediabetes 07/25/2019  . Left middle cerebral artery  stroke ([facility]) 07/25/2019  . 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 Dysphagia 07/25/2019  . 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 Acute respiratory failure ([facility])   . 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 Atrial fibrillation with RVR ([facility])   . Hypertension 07/23/2015  . Hyperlipidemia 07/23/2015  . 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 Hypothyroidism 01/25/2014    [facility] ,[place], [place]  08/21/2019, 12:41 PM  [name] [address] [place], [place], [address] Phone: [phone]   Fax:  [phone]   Name: Jamie Mitchell MRN: [id number] Date of Birth: 1946-03-15

## 2019-08-21 NOTE — Progress Notes (Addendum)
Acute Office Visit  Subjective:    Patient ID: Jamie Mitchell, female    DOB: 1946-04-19, 74 y.o.   MRN: [id number]  Chief Complaint  Patient presents with  . Medication Refill    Thyroid   Virtual Visit via Telephone Note  I connected with Jamie Mitchell on 08/21/19 at  3:30 PM EDT by telephone and verified that I am speaking with the correct person using two identifiers.  Location: Patient: home Provider: [facility]   I discussed the limitations, risks, security and privacy concerns of performing an evaluation and management service by telephone and the availability of in person appointments. I also discussed with the patient that there may be a patient responsible charge related to this service. The patient expressed understanding and agreed to proceed.   History of Present Illness:   Due to language barrier, an interpreter was present during the history-taking and subsequent discussion  with this patient.  I spoke with daughter-in-law Jamie Mitchell, she is a designated party release, states that she is worried that her mother needs to have her Synthroid increased.  Reports that she was called from the cardiology office and told to follow-up with primary care provider because the thyroid levels that they checked in the office were still abnormal. Patient was seen by cardiology on August 16, 2019 due to recent history of acute CVA on July 19, 2019.  Note from cardiology: Jamie Mitchell is a 74 y.o. female with a hx of hypertension,HLD,and thyroid disease who presents for hospital follow-up of atrial fibrillation, tachybradycardia syndrome.  Patient moved from [place] to [place] last month.  She was admitted on 07/19/2019 to [facility] with acute CVA.  TPA was administered, and CTA head showed M2 occlusion.  Thrombectomy and revascularization were complicated by subarachnoid hemorrhage.  She was admitted to the neuro ICU.  Course was complicated by  intermittent atrial fibrillation/atrial flutter with RVR with postconversion pauses up to 8 seconds.  EP was consulted, and she was started on amiodarone.  She was discharged on Eliquis 5 mg twice daily and amiodarone 200 mg twice daily.  TTE during admission showed EF 60 to 65%, normal RV function, mild MR.  Since discharge from the hospital, she reports that she is improving.  Continues to have mild weakness and difficulty speaking.  She denies any palpitations, lightheadedness, chest pain, dyspnea, or syncope.  Has been taking Eliquis, denies any bleeding issues.  Patient [facility], was previously treated for hypothyroid, medication was restarted after her acute CVA.  Her TSH labs are:  Results for Jamie Mitchell, Jamie Mitchell (MRN [id number]) as of 08/21/2019 16:33  07/20/2019 03:56 TSH: 6.781 (H)  07/21/2019 04:05 TSH: 7.052 (H) T4,Free(Direct): 0.80  08/16/2019 11:42 TSH: 11.000 (H) T4,Free(Direct): 1.53   Daughter-in-law does confirm that patient is taking level thyroxine, 50 mg every other day, 75 mg every other day, alternating days.  does not need refills on the medication at this time.  Observations/Objective: Hospital course from acute CVA in February 2021, lab results, note from cardiology visit August 16, 2019 reviewed.  No physical exam was completed due to virtual visit     Past Medical History:  Diagnosis Date  . Hypertension   . Thyroid disease     Past Surgical History:  Procedure Laterality Date  . IR CT HEAD LTD  07/20/2019  . IR PERCUTANEOUS ART THROMBECTOMY/INFUSION INTRACRANIAL INC DIAG ANGIO  07/20/2019  . RADIOLOGY WITH ANESTHESIA N/A 07/20/2019  Procedure: IR WITH ANESTHESIA;  Surgeon: Jamie Bras, MD;  Location: [facility];  Service: Radiology;  Laterality: N/A;    Family History  Family history unknown: Yes    Social History   Socioeconomic History  . Marital status: Married    Spouse name: Not on file  .  Number of children: Not on file  . Years of education: Not on file  . Highest education level: Not on file  Occupational History  . Not on file  Tobacco Use  . Smoking status: Never Smoker  . Smokeless tobacco: Never Used  Substance and Sexual Activity  . Alcohol use: No  . Drug use: No  . Sexual activity: Not Currently  Other Topics Concern  . Not on file  Social History Narrative  . Not on file   Social Determinants of Health   Financial Resource Strain:   . Difficulty of Paying Living Expenses:   Food Insecurity:   . Worried About Charity fundraiser in the Last Year:   . Arboriculturist in the Last Year:   Transportation Needs:   . Film/video editor (Medical):   [name] Lack of Transportation (Non-Medical):   Physical Activity:   . Days of Exercise per Week:   . Minutes of Exercise per Session:   Stress:   . Feeling of Stress :   Social Connections:   . Frequency of Communication with Friends and Family:   . Frequency of Social Gatherings with Friends and Family:   . Attends Religious Services:   . Active Member of Clubs or Organizations:   . Attends Archivist Meetings:   [name] Marital Status:   Intimate Partner Violence:   . Fear of Current or Ex-Partner:   . Emotionally Abused:   [name] Physically Abused:   . Sexually Abused:     Outpatient Medications Prior to Visit  Medication Sig Dispense Refill  . acetaminophen (TYLENOL) 325 MG tablet Take 2 tablets (650 mg total) by mouth every 4 (four) hours as needed for mild pain (or temp > 37.5 C (99.5 F)).    [name] amiodarone (PACERONE) 200 MG tablet Take 1 tablet (200 mg total) by mouth daily. 60 tablet 0  . apixaban (ELIQUIS) 5 MG TABS tablet Take 1 tablet (5 mg total) by mouth 2 (two) times daily. 60 tablet 1  . aspirin EC 81 MG EC tablet Take 1 tablet (81 mg total) by mouth daily.    [name] atorvastatin (LIPITOR) 40 MG tablet Take 1 tablet (40 mg total) by mouth daily. 30 tablet 0  . diclofenac Sodium (VOLTAREN) 1 %  GEL Apply 2 g topically 4 (four) times daily as needed (pain). 2 g 0  . levothyroxine (SYNTHROID) 100 MCG tablet Take 0.5-1 tablets (50-100 mcg total) by mouth See admin instructions. 37mcg one day, 78mcg the next day, then repeat. 60 tablet 0   No facility-administered medications prior to visit.    No Known Allergies      Objective:      There were no vitals taken for this visit. Wt Readings from Last 3 Encounters:  08/16/19 152 lb 12.8 oz (69.3 kg)  08/11/19 151 lb (68.5 kg)  07/25/19 155 lb 6.8 oz (70.5 kg)    Health Maintenance Due  Topic Date Due  . TETANUS/TDAP  Never done  . MAMMOGRAM  Never done  . COLONOSCOPY  Never done  . DEXA SCAN  Never done  . INFLUENZA VACCINE  12/24/2018    There  are no preventive care reminders to display for this patient.   Lab Results  Component Value Date   TSH 11.000 (H) 08/16/2019   Lab Results  Component Value Date   WBC 7.3 07/26/2019   HGB 14.8 07/26/2019   HCT 44.6 07/26/2019   MCV 88.8 07/26/2019   PLT 226 07/26/2019   Lab Results  Component Value Date   NA 137 07/28/2019   K 3.9 07/28/2019   CO2 21 (L) 07/28/2019   GLUCOSE 166 (H) 07/28/2019   BUN 23 07/28/2019   CREATININE 1.05 (H) 07/28/2019   BILITOT 0.8 07/26/2019   ALKPHOS 85 07/26/2019   AST 35 07/26/2019   ALT 38 07/26/2019   PROT 7.0 07/26/2019   ALBUMIN 3.3 (L) 07/26/2019   CALCIUM 8.5 (L) 07/28/2019   ANIONGAP 11 07/28/2019   Lab Results  Component Value Date   CHOL 113 07/20/2019   Lab Results  Component Value Date   HDL 36 (L) 07/20/2019   Lab Results  Component Value Date   LDLCALC 66 07/20/2019   Lab Results  Component Value Date   TRIG 57 07/20/2019   Lab Results  Component Value Date   CHOLHDL 3.1 07/20/2019   Lab Results  Component Value Date   HGBA1C 6.1 (H) 07/20/2019   HGBA1C 6.0 (H) 07/20/2019       Assessment & Plan:   Problem List Items Addressed This Visit      Cardiovascular and Mediastinum   Embolic  stroke involving left middle cerebral artery (HCC) s/p tPA and mechanical thrombectomy, d/t AF     Endocrine   Hypothyroidism - Primary (Chronic)      Follow Up Instructions: 1. Other specified hypothyroidism Patient was restarted on Synthroid 4 weeks ago, has upcoming appointment [facility], 2019.  Gave  assurance that labs would be rechecked at that time, and medication changes would be made if appropriate.  Explained to patient that it was appropriate to wait 6 weeks before making medication changes for hypothyroidism.  Encouraged daughter-in-law to sign up for my chart, sent text message and gave explanation on how to sign up.  2. 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 The patient was provided an opportunity to ask questions and all were answered. The patient agreed with the plan and demonstrated an understanding of the instructions.   The patient was advised to call back or seek an in-person evaluation if the symptoms worsen or if the condition fails to improve as anticipated.  I provided 25 minutes of non-face-to-face time during this encounter.   Loraine Grip Mayers, PA-C

## 2019-08-21 NOTE — Progress Notes (Signed)
Patient has not seen a PCP in 3 years. Patient went to the UC due to her cardiologist advising her to have her levels checked. Patients blood work reflected elevated TSH levels and patient is needing a refill on her thyroid medication until she has reestablished care.

## 2019-08-21 NOTE — Therapy (Signed)
[facility] [address] [place], [place], [address] Phone: [phone]   Fax:  [phone]  Physical Therapy Treatment  Patient Details  Name: Jamie Mitchell MRN: [id number] Date of Birth: [birth year] Referring Provider (PT): Alysia Penna, MD   Encounter Date: 08/21/2019  PT End of Session - 08/21/19 1128    Visit Number  2    Number of Visits  17    Date for PT Re-Evaluation  10/13/19    Authorization Type  Medicare-10th visit progress note    PT Start Time  1017    PT Stop Time  1101    PT Time Calculation (min)  44 min    Activity Tolerance  Patient tolerated treatment well    Behavior During Therapy  [facility] for tasks assessed/performed       Past Medical History:  Diagnosis Date  . Hypertension   . Thyroid disease     Past Surgical History:  Procedure Laterality Date  . IR CT HEAD LTD  07/20/2019  . IR PERCUTANEOUS ART THROMBECTOMY/INFUSION INTRACRANIAL INC DIAG ANGIO  07/20/2019  . RADIOLOGY WITH ANESTHESIA N/A 07/20/2019   Procedure: IR WITH ANESTHESIA;  Surgeon: Luanne Bras, MD;  Location: [place];  Service: Radiology;  Laterality: N/A;    There were no vitals filed for this visit.  Subjective Assessment - 08/21/19 1020    Subjective  Per interpretor no changes since last visit, no pain.    Patient is accompained by:  Family member;Interpreter    Limitations  Walking;House hold activities    Currently in Pain?  No/denies                       [facility] Adult PT Treatment/Exercise - 08/21/19 1057      Ambulation/Gait   Ambulation/Gait  Yes    Ambulation/Gait Assistance  5: Supervision    Ambulation/Gait Assistance Details  S (min/guard at times to guide direction wise) during balance challenges.  Cues for improved arm swing.      Ambulation Distance (Feet)  230 Feet   +300' with balance challenges    Assistive device  None    Gait Pattern  Step-through  pattern;Decreased stride length;Decreased arm swing - right;Decreased arm swing - left;Narrow base of support;Trunk flexed    Ambulation Surface  Level;Indoor      High Level Balance   High Level Balance Activities  Backward walking;Direction changes;Turns;Sudden stops    High Level Balance Comments  Performed x 300' with PT providing cues to change directions, backwards gait, quick turns and sudden stops.  Pt did very well with no overt LOB during turns today.  Did note difficulty with backwards walking.        Neuro Re-ed    Neuro Re-ed Details   See HEP for balance exercises in corner and counter top.  Also performed high level balance in // bars on foam beam for improved hip strategy.  feet narrowed maintaining balance x 2 sets of 20 secs>forward stepping to floor with return to beam x 10 reps with intermittent UE support as needed. Progressed to alternating cone taps while on foam x 10 reps with intermittent UE support (pt with marked diffficulty due to cognitive deficits and some apparent apraxia) but with repetition improved.  Ended with standing on small rocker board biased vertically maintaining balance x 20 secs (difficulty following cues due to cognition as she kept wanting to rock board).  Progressed to rocking posterior with hips  posterior and arms forward transitioning to hips forward arms back.  Again this was difficult due to cognition, but did improve somewhat within session.           Access Code: [email or web address] URL: [email or web address] Date: 08/21/2019 Prepared by: Cameron Sprang  Exercises Walking March - 1 x daily - 7 x weekly - 1 sets - 4 reps Tandem Walking with Counter Support - 1 x daily - 7 x weekly - 1 sets - 4 reps Standing Hip Abduction with Counter Support - 1 x daily - 7 x weekly - 1 sets - 10 reps Romberg Stance Eyes Closed on Foam Pad - 1 x daily - 7 x weekly - 10 reps - 3 sets Romberg Stance on Foam Pad with Head Rotation - 1 x daily - 7 x weekly - 10  reps - 3 sets Romberg Stance with Head Nods on Foam Pad - 1 x daily - 7 x weekly - 10 reps - 3 sets Sit to Stand without Arm Support - 1 x daily - 7 x weekly - 1 sets - 10 reps     PT Education - 08/21/19 1128    Education Details  HEP for balance, strength    Person(s) Educated  Patient;Child(ren)   via interpreter   Methods  Explanation;Demonstration;Handout    Comprehension  Verbalized understanding;Returned demonstration;Verbal cues required;Tactile cues required;Need further instruction       PT Short Term Goals - 08/14/19 1433      PT SHORT TERM GOAL #1   Title  Pt will be independent with initial HEP in order to indiciate improved functional mobility and decreased fall risk.  (Target Date: 09/13/19)    Time  4    Period  Weeks    Status  New    Target Date  09/13/19      PT SHORT TERM GOAL #2   Title  Pt will improve FGA to >/=19/30 in order to indicate decreased fall risk.    Time  4    Period  Weeks    Status  New      PT SHORT TERM GOAL #3   Title  Pt will improve 5TSS to </=13 secs without UE support in order to indicate improved functional strength.    Time  4    Period  Weeks    Status  New      PT SHORT TERM GOAL #4   Title  Pt will improve gait speed to >/=3.71 ft/sec with no overt LOB/sway in order to indicate more normal gait speed.    Time  4    Period  Weeks    Status  New      PT SHORT TERM GOAL #5   Title  Pt will ambulate up/down stairs with single rail in alternating pattern at mod I level in order to indicate safe home entry/exit.    Time  4    Period  Weeks    Status  New        PT Long Term Goals - 08/14/19 1437      PT LONG TERM GOAL #1   Title  Pt will be independent with final HEP (with cues from family as needed) in order to indicate improved functional mobility and decreased fall risk.  (Target Date: 10/13/19)    Time  8    Period  Weeks    Status  New    Target Date  10/13/19      PT LONG TERM  GOAL #2   Title  Pt will improve  FGA to >/=23/30 in order to indicate decreased fall risk.    Time  8    Period  Weeks    Status  New      PT LONG TERM GOAL #3   Title  Pt will ambulate >1000' over varying outdoor surfaces at mod I (S from a cognitive standpoint) level while scanning environment in order to indicate safe community mobility.    Time  8    Period  Weeks    Status  New      PT LONG TERM GOAL #4   Title  Pt will perform simulated gardening tasks at mod I level in order to return to leisure activities.    Time  8    Period  Weeks    Status  New            Plan - 08/21/19 1129    Clinical Impression Statement  Skilled session focused on initiating high level balance and strengthening HEP and continued high level balance exercises and gait.  Pt doing very well, difficult to provide high level balance challenges due to cognition/apraxia.    Personal Factors and Comorbidities  Comorbidity 3+;Age    Comorbidities  see above    Examination-Activity Limitations  Carry;Dressing;Stand;Lift;Stairs;Squat;Physicist, medical Activity;Laundry;Shop;Yard Work    [employer]  Evolving/Moderate complexity    Rehab Potential  Excellent    PT Frequency  2x / week    PT Duration  8 weeks    PT Treatment/Interventions  ADLs/Self Care Home Management;Aquatic Therapy;Gait training;Stair training;Functional mobility training;Therapeutic activities;Therapeutic exercise;Balance training;Neuromuscular re-education;Patient/family education;Passive range of motion;Vestibular    PT Next Visit Plan  Continue to add to HEP as needed (update as needed) for high level balance and R LE strength (esp hip), outdoor gait, wider BOS with gait, turns, compliant surfaces.    Consulted and Agree with Plan of Care  Patient;Family member/caregiver    Family Member Consulted  daughter in law via interpreter       Patient will benefit from skilled  therapeutic intervention in order to improve the following deficits and impairments:  Abnormal gait, Decreased activity tolerance, Decreased balance, Decreased cognition, Decreased endurance, Decreased mobility, Decreased strength, Impaired perceived functional ability, Improper body mechanics, Postural dysfunction  Visit Diagnosis: Muscle weakness (generalized)  Hemiplegia and hemiparesis following cerebral infarction affecting right dominant side (HCC)  Other abnormalities of gait and mobility  Unsteadiness on feet     Problem List Patient Active Problem List   Diagnosis Date Noted  . Renal failure 07/25/2019  . Prediabetes 07/25/2019  . 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 Dysphagia 07/25/2019  . 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 Acute respiratory failure ([place])   . Tachycardia-bradycardia syndrome ([place])   . Atrial fibrillation with RVR ([place])   . Hypertension 07/23/2015  . Hyperlipidemia 07/23/2015  . 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 Hypothyroidism 01/25/2014    Cameron Sprang, PT, MPT [facility] [address] [place], [place], [address] Phone: [phone]   Fax:  [phone] 08/21/19, 11:33 AM  Name: Kadijah Trembley MRN: [id number] Date of Birth: April 24, 1946

## 2019-08-28 ENCOUNTER — Other Ambulatory Visit: Payer: Self-pay

## 2019-08-28 ENCOUNTER — Encounter: Payer: Self-pay | Admitting: Occupational Therapy

## 2019-08-28 ENCOUNTER — Ambulatory Visit: Payer: Medicare Other | Admitting: Cardiology

## 2019-08-28 ENCOUNTER — Ambulatory Visit: Payer: Medicare Other | Attending: Family Medicine | Admitting: Occupational Therapy

## 2019-08-28 DIAGNOSIS — R2689 Other abnormalities of gait and mobility: Secondary | ICD-10-CM | POA: Diagnosis not present

## 2019-08-28 DIAGNOSIS — R41841 Cognitive communication deficit: Secondary | ICD-10-CM | POA: Diagnosis not present

## 2019-08-28 DIAGNOSIS — R2681 Unsteadiness on feet: Secondary | ICD-10-CM | POA: Diagnosis not present

## 2019-08-28 DIAGNOSIS — M6281 Muscle weakness (generalized): Secondary | ICD-10-CM | POA: Diagnosis not present

## 2019-08-28 DIAGNOSIS — R482 Apraxia: Secondary | ICD-10-CM

## 2019-08-28 DIAGNOSIS — I69351 Hemiplegia and hemiparesis following cerebral infarction affecting right dominant side: Secondary | ICD-10-CM | POA: Diagnosis not present

## 2019-08-28 DIAGNOSIS — I69318 Other symptoms and signs involving cognitive functions following cerebral infarction: Secondary | ICD-10-CM | POA: Diagnosis not present

## 2019-08-28 DIAGNOSIS — R4701 Aphasia: Secondary | ICD-10-CM | POA: Diagnosis not present

## 2019-08-28 DIAGNOSIS — R1312 Dysphagia, oropharyngeal phase: Secondary | ICD-10-CM | POA: Insufficient documentation

## 2019-08-28 NOTE — Therapy (Signed)
Jamie [place], [place], [address] Phone: [phone]   Fax:  [phone]  Occupational Therapy Treatment  Patient Details  Name: Jamie Mitchell MRN: [id number] Date of Birth: [birth year] Referring Provider (OT): Dr. Letta Pate   Encounter Date: 08/28/2019  OT End of Session - 08/28/19 1401    Visit Number  3    Number of Visits  9    Date for OT Re-Evaluation  09/11/19    Authorization Type  medicare - will need PN every 10th visit    Authorization Time Period  90 days - 11/06/2019    Authorization - Visit Number  3    Authorization - Number of Visits  10    Progress Note Due on Visit  10    OT Start Time  1320    OT Stop Time  1358    OT Time Calculation (min)  38 min    Activity Tolerance  Patient tolerated treatment well    Behavior During Therapy  [facility] for tasks assessed/performed       Past Medical History:  Diagnosis Date  . Hypertension   . Thyroid disease     Past Surgical History:  Procedure Laterality Date  . IR CT HEAD LTD  07/20/2019  . IR PERCUTANEOUS ART THROMBECTOMY/INFUSION INTRACRANIAL INC DIAG ANGIO  07/20/2019  . RADIOLOGY WITH ANESTHESIA N/A 07/20/2019   Procedure: IR WITH ANESTHESIA;  Surgeon: Luanne Bras, MD;  Location: [place];  Service: Radiology;  Laterality: N/A;    There were no vitals filed for this visit.  Subjective Assessment - 08/28/19 1328    Subjective   Patient indicates that she has a little pain - points to her neck    Patient is accompanied by:  Family member    Pertinent History  L MCA CVA 07/19/19    Currently in Pain?  Yes    Pain Score  3     Pain Location  Neck    Pain Orientation  Right    Pain Descriptors / Indicators  Aching    Pain Type  Acute pain         [facility] OT Assessment - 08/28/19 0001      Hand Function   Right Hand Grip (lbs)  31    Left Hand Grip (lbs)  30               OT Treatments/Exercises (OP) -  08/28/19 0001      ADLs   Overall ADLs  Daughter in law here with patient today.  SHe indicates that patient is starting to assist with cooking familiar items, e.g. tortillas at home.  She said she notices more use of right hand in functional activities like putting away doishes, and dressing.      Functional Mobility  Worked on simulated activity to address reaching from floor to overhead.  Patient lwithout any loss of balance, but after 10 minutes of stepping up ont box and down to reach well overhead, patient became slightly short of breath.  Patient with 99% O2 rate and pulse of 50bpm, restored natural breathing within one minute.      ADL Comments  Patient has met LTG grip strength - reports putty exercises are going well.                    OT Long Term Goals - 08/28/19 1336      OT LONG TERM GOAL #2   Title  Pt will demonstrate improved grip strength in R hand by at least 4 pounds to assist with home mgmt tasks    Status  Achieved            Plan - 08/28/19 1503    Clinical Impression Statement  Patient showing excellent functional progress.    OT Frequency  2x / week    OT Duration  4 weeks    OT Treatment/Interventions  Self-care/ADL training;Aquatic Therapy;Therapeutic exercise;Neuromuscular education;Energy conservation;Functional Mobility Training;Patient/family education;Cognitive remediation/compensation;Therapeutic activities;Balance training    Plan  Needs further work on stamina and balance in functional setting  (simple cooking, housekeeping tasks)    OT Home Exercise Plan  putty issued last session    Consulted and Agree with Plan of Care  Patient;Family member/caregiver    Family Member Consulted  dtr in Copy       Patient will benefit from skilled therapeutic intervention in order to improve the following deficits and impairments:           Visit Diagnosis: Muscle weakness (generalized)  Hemiplegia and hemiparesis following cerebral  infarction affecting right dominant side (HCC)  Unsteadiness on feet  Apraxia  Other symptoms and signs involving cognitive functions following cerebral infarction    Problem List Patient Active Problem List   Diagnosis Date Noted  . Renal failure 07/25/2019  . Prediabetes 07/25/2019  . 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 Dysphagia 07/25/2019  . 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 Acute respiratory failure ([place])   . Tachycardia-bradycardia syndrome ([place])   . Atrial fibrillation with RVR ([place])   . Hypertension 07/23/2015  . Hyperlipidemia 07/23/2015  . 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 Hypothyroidism 01/25/2014    Jamie Mitchell, OTR/L 08/28/2019, 3:10 PM  [facility] [place] [address] [place], [place], [address] Phone: [phone]   Fax:  [phone]  Name: Jamie Mitchell MRN: [id number] Date of Birth: 1945-06-13

## 2019-08-29 ENCOUNTER — Ambulatory Visit: Payer: Self-pay | Admitting: *Deleted

## 2019-08-30 ENCOUNTER — Other Ambulatory Visit: Payer: Self-pay

## 2019-08-30 ENCOUNTER — Ambulatory Visit: Payer: Medicare Other

## 2019-08-30 ENCOUNTER — Ambulatory Visit: Payer: Medicare Other | Admitting: Occupational Therapy

## 2019-08-30 ENCOUNTER — Ambulatory Visit: Payer: Medicare Other | Admitting: Nurse Practitioner

## 2019-08-30 DIAGNOSIS — R41841 Cognitive communication deficit: Secondary | ICD-10-CM

## 2019-08-30 DIAGNOSIS — I69318 Other symptoms and signs involving cognitive functions following cerebral infarction: Secondary | ICD-10-CM

## 2019-08-30 DIAGNOSIS — M6281 Muscle weakness (generalized): Secondary | ICD-10-CM | POA: Diagnosis not present

## 2019-08-30 DIAGNOSIS — R1312 Dysphagia, oropharyngeal phase: Secondary | ICD-10-CM

## 2019-08-30 DIAGNOSIS — I69351 Hemiplegia and hemiparesis following cerebral infarction affecting right dominant side: Secondary | ICD-10-CM | POA: Diagnosis not present

## 2019-08-30 DIAGNOSIS — R482 Apraxia: Secondary | ICD-10-CM | POA: Diagnosis not present

## 2019-08-30 DIAGNOSIS — R2689 Other abnormalities of gait and mobility: Secondary | ICD-10-CM

## 2019-08-30 DIAGNOSIS — R2681 Unsteadiness on feet: Secondary | ICD-10-CM | POA: Diagnosis not present

## 2019-08-30 DIAGNOSIS — R4701 Aphasia: Secondary | ICD-10-CM

## 2019-08-30 NOTE — Therapy (Signed)
[facility] [address] [place], [place], [address] Phone: [phone]   Fax:  [phone]  Speech Language Pathology Treatment  Patient Details  Name: Jamie Mitchell MRN: [id number] Date of Birth: 1945/11/07 Referring Provider (SLP): Alysia Penna, MD   Encounter Date: 08/30/2019  End of Session - 08/30/19 1421    Visit Number  4    Number of Visits  17    Date for SLP Re-Evaluation  11/17/19    SLP Start Time  1320    SLP Stop Time   1400    SLP Time Calculation (min)  40 min    Activity Tolerance  Patient tolerated treatment well       Past Medical History:  Diagnosis Date  . Hypertension   . Thyroid disease     Past Surgical History:  Procedure Laterality Date  . IR CT HEAD LTD  07/20/2019  . IR PERCUTANEOUS ART THROMBECTOMY/INFUSION INTRACRANIAL INC DIAG ANGIO  07/20/2019  . RADIOLOGY WITH ANESTHESIA N/A 07/20/2019   Procedure: IR WITH ANESTHESIA;  Surgeon: Luanne Bras, MD;  Location: [place];  Service: Radiology;  Laterality: N/A;    There were no vitals filed for this visit.  Subjective Assessment - 08/30/19 1321    Subjective  Pt family reports pt is not having "hard" foods and that pt does not exhibit overt s/sx aspiration during meals.    Patient is accompained by:  Family member;Interpreter    Currently in Pain?  No/denies            ADULT SLP TREATMENT - 08/30/19 1323      General Information   Behavior/Cognition  Alert;Cooperative;Pleasant mood;Distractible;Decreased sustained attention;Requires cueing;Doesn't follow directions      Treatment Provided   Treatment provided  Cognitive-Linquistic      Cognitive-Linquistic Treatment   Treatment focused on  Cognition;Aphasia    Skilled Treatment  "She doesn't buy anything" (daughter in law, when SLP attempted to engage pt with attention task using coins) Cognition: 17 minutes-SLP worked with pt with sustained/selective  attention with ordering 4-5 step sequences. Pt req'd mod-max A occasionally for reasoning and for attention for this task. Difficult to ascertain pt level of auditory comprehension - SLP attempted to mitigate this complication with use of pictures, to work primarily on attention. Speech tx individual: SLP had pt read common Spanish phrases focusing on slowed rate ("ope nyour mouth wide") - pt req'd mod A usualy for maintaining slower rate in order to improve articulation/syntax. Most of pt errors were syntax-based (i.e., "puedes" for puedo, "ayudar" for ayudarme, "bueno" for buenas). Pt aware of errors approx 50% of the time, c/b pt attempt to retry the phrase/sentence. SLP told pt to cont to use slower rate ("open your mouth wide when you talk") with these sentences. SLP told daughter in law they could add more common Spanish phrases to the list.       Assessment / Recommendations / [place] with current plan of care      Progression Toward Goals   Progression toward goals  Progressing toward goals       SLP Education - 08/30/19 1420    Education Details  use sentences to habitualize slower rate    Person(s) Educated  Patient;Child(ren)    Methods  Explanation;Demonstration;Verbal cues    Comprehension  Verbalized understanding;Returned demonstration;Verbal cues required;Need further instruction       SLP Short Term Goals - 08/30/19 1422  SLP SHORT TERM GOAL #1   Title  pt will produce sentences describing pictures with MLU = 5.5 (at least 25 utterances) in 2 sessions    Time  2    Period  Weeks    Status  On-going      SLP SHORT TERM GOAL #2   Title  pt will communicate basic wants and needs functionally (reported by pt and/or family) with family with min questioning cues over 3 sessions    Time  2    Period  Weeks    Status  On-going      SLP SHORT TERM GOAL #3   Title  pt and/or family will indicate 3 overt s/sx aspiration PNA with modified independence    Time   2    Period  Weeks    Status  On-going      SLP SHORT TERM GOAL #4   Title  pt will follow commands with superlatives or comparatives 80% with occasional min-mod A in 2 sessions    Time  2    Period  Weeks    Status  On-going       SLP Long Term Goals - 08/30/19 1422      SLP LONG TERM GOAL #1   Title  pt will participate in simple conversation with MLU=7.0    Time  6    Period  Weeks   or 17 sessions, for all LTGs   Status  On-going      SLP LONG TERM GOAL #2   Title  pt will practice speech and language tasks at home at least 4 days/week at least 6 weeks    Time  6    Period  Weeks    Status  On-going      SLP LONG TERM GOAL #3   Title  pt will demo understanding of 10 minutes simple-mod complex conversation with modified indpendence    Time  6    Period  Weeks    Status  On-going      SLP LONG TERM GOAL #4   Title  pt will demonstrate attention appropriate for efficacious completion of therapy tasks for 40 minutes in 8 sessions    Time  7    Period  Weeks    Status  On-going       Plan - 08/30/19 1421    Clinical Impression Statement  Pt is limited [place] speaker (therefore therapy tasks were completed via live interpreter) who presents with receptive and expressive aphasia, 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 Pt remains on dys III/thin currently and daughter in law reports no difficulties with this at home. Pt ?able cognitive linquistic skills - testing to be completed as needed. Difficult to asses currently due to time constraints. Further testing to be completed if necessary. Pt would benefit from skilled ST targeting receptive and expressive language and verbal skills.    Speech Therapy Frequency  2x / week    Duration  --   8 weeks or 17 total visits   Treatment/Interventions  Aspiration precaution training;Pharyngeal strengthening exercises;Diet toleration management by SLP;Trials of upgraded texture/liquids;Language facilitation;Cueing hierarchy;SLP  instruction and feedback;Internal/external aids;Compensatory strategies;Patient/family education;Cognitive reorganization;Functional tasks;Environmental controls;Multimodal communcation approach    Potential to Achieve Goals  Fair    Potential Considerations  Severity of impairments;Cooperation/participation level    Consulted and Agree with Plan of Care  Patient       Patient will benefit from skilled therapeutic intervention in order to improve the following  deficits and impairments:   Aphasia  Verbal apraxia  Cognitive communication deficit  Dysphagia, oropharyngeal phase    Problem List Patient Active Problem List   Diagnosis Date Noted  . Renal failure 07/25/2019  . Prediabetes 07/25/2019  . 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 Dysphagia 07/25/2019  . 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 Acute respiratory failure ([place])   . Tachycardia-bradycardia syndrome ([place])   . Atrial fibrillation with RVR ([place])   . Hypertension 07/23/2015  . Hyperlipidemia 07/23/2015  . 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 Hypothyroidism 01/25/2014    [facility] ,[place], [place]  08/30/2019, 2:23 PM  [place] [address] [place], [place], [address] Phone: [phone]   Fax:  [phone]   Name: Jennilyn Eliades MRN: [id number] Date of Birth: Mar 29, 1946

## 2019-08-30 NOTE — Therapy (Signed)
[facility] [address] [place], [place], [address] Phone: [phone]   Fax:  [phone]  Occupational Therapy Treatment  Patient Details  Name: Tanasha Nuber MRN: [id number] Date of Birth: 01-Jun-1945 Referring Provider (OT): Dr. Letta Pate   Encounter Date: 08/30/2019  OT End of Session - 08/30/19 1236    Visit Number  4    Number of Visits  9    Date for OT Re-Evaluation  09/11/19    Authorization Type  medicare - will need PN every 10th visit    Authorization Time Period  90 days - 11/06/2019    Authorization - Visit Number  4    Authorization - Number of Visits  10    Progress Note Due on Visit  10    OT Start Time  1234    OT Stop Time  1314    OT Time Calculation (min)  40 min    Activity Tolerance  Patient tolerated treatment well    Behavior During Therapy  [facility] for tasks assessed/performed       Past Medical History:  Diagnosis Date  . Hypertension   . Thyroid disease     Past Surgical History:  Procedure Laterality Date  . IR CT HEAD LTD  07/20/2019  . IR PERCUTANEOUS ART THROMBECTOMY/INFUSION INTRACRANIAL INC DIAG ANGIO  07/20/2019  . RADIOLOGY WITH ANESTHESIA N/A 07/20/2019   Procedure: IR WITH ANESTHESIA;  Surgeon: Luanne Bras, MD;  Location: [facility];  Service: Radiology;  Laterality: N/A;    There were no vitals filed for this visit.  Subjective Assessment - 08/30/19 1235    Patient is accompanied by:  Family member    Pertinent History  L MCA CVA 07/19/19    Currently in Pain?  No/denies              Treatment: Functional standing x 20 mins to fold laundy, carry laundry basket across gym then copping small peg design in standing with RUE( for functional reach and RUE functional use). Pt required mod v.c via interpreter for copying peg design.  Arm bike x 6 mins level 1 for reciprocal movement and conditioning.             OT Education - 08/30/19 1242     Education Details  upgraded putty to green for increased resistance pt returned demonstration of HEP.    Person(s) Educated  Patient;Child(ren);Other (comment)   interpreter   Methods  Explanation;Demonstration;Verbal cues;Handout    Comprehension  Verbalized understanding;Returned demonstration;Verbal cues required          OT Long Term Goals - 08/30/19 1236      OT LONG TERM GOAL #1   Title  Pt and family will be mod I with HEP for grip strength and home activities program for balance, increased independence and activity tolerance - 09/11/2019    Status  On-going      OT LONG TERM GOAL #2   Title  Pt will demonstrate improved grip strength in R hand by at least 4 pounds to assist with home mgmt tasks    Status  On-going      OT LONG TERM GOAL #3   Title  Pt will require no more than supervision to cook simple familiar hot meal    Status  On-going      OT LONG TERM GOAL #4   Title  Pt will demonstrate ability to complete at least 40 minutes of functional familiar activity with no more  than one rest break    Status  On-going            Plan - 08/30/19 1310    Clinical Impression Statement  Pt is progressing towards goals with improving strength and endurance.    OT Frequency  2x / week    OT Duration  4 weeks    OT Treatment/Interventions  Self-care/ADL training;Aquatic Therapy;Therapeutic exercise;Neuromuscular education;Energy conservation;Functional Mobility Training;Patient/family education;Cognitive remediation/compensation;Therapeutic activities;Balance training    Plan  simple cooking task    OT Home Exercise Plan  putty issued last session    Consulted and Agree with Plan of Care  Patient;Family member/caregiver    Family Member Consulted  dtr in Copy, interpreter       Patient will benefit from skilled therapeutic intervention in order to improve the following deficits and impairments:           Visit Diagnosis: Muscle weakness  (generalized)  Hemiplegia and hemiparesis following cerebral infarction affecting right dominant side ([place])  Other symptoms and signs involving cognitive functions following cerebral infarction  Apraxia  Other abnormalities of gait and mobility    Problem List Patient Active Problem List   Diagnosis Date Noted  . Renal failure 07/25/2019  . Prediabetes 07/25/2019  . 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 Dysphagia 07/25/2019  . 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 Acute respiratory failure ([place])   . Tachycardia-bradycardia syndrome ([place])   . Atrial fibrillation with RVR ([place])   . Hypertension 07/23/2015  . Hyperlipidemia 07/23/2015  . 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 Hypothyroidism 01/25/2014    Shatasia Cutshaw 08/30/2019, 1:12 PM  [facility] [address] [place], [place], [address] Phone: [phone]   Fax:  [phone]  Name: Tamatha Murthy MRN: [id number] Date of Birth: Sep 20, 1945

## 2019-08-30 NOTE — Patient Instructions (Signed)
[employer]  Cmo ests?  Puede ayudarme?  Claro.  Te quiero.  Puede repetirlo!  Hasta luego.  Adonde vas?  Tengo hambre.  Puedo entrar?   Jamie Mitchell es?  Buenas noches.  Mucho gusto.  Jamie Mitchell, [place].

## 2019-08-31 ENCOUNTER — Other Ambulatory Visit: Payer: Self-pay | Admitting: *Deleted

## 2019-08-31 NOTE — Patient Outreach (Signed)
[facility] Care Management  08/31/2019  Jamie Mitchell 1945-10-04 [id number]   Subjective: Telephone call to patient's son / designated party release [facility])  home number, no answer, message states no voicemail set up, and unable to leave a message.    Objective: Per KPN (Knowledge Performance Now, point of care tool) and chart review, patient hospitalized 07/25/2019 - 08/03/2019 for Left middle cerebral artery stroke rehab.  Patient hospitalized 07/19/2019 - [id number] for Embolic stroke involving left middle cerebral artery, status post tPA, mechanical thrombectomy, due to Atrial fibrillation.   Patient also has a history of Hypothyroidism, Hypertension, Prediabetes,  Aphasia as late effect of cerebrovascular accident,  and Dysphagia.    Assessment: Received [facility] Liaison referral on 08/03/2019.  Referral source: Natividad Brood.   Referral reason: This was a hospital referral from the nutritionist/patient on SSI family needed teaching and support -Has home health; [facility] is TOC.  Please assign to [place] Coordinator for complex care and disease management follow up calls, patient is aphasic and speaks/understands Spanish, her son is the contact person and speaks fluent [place], his name is [name], (Son) [phone] and assess for further needs. Patient was in the rehab center. Screening follow up completed, has referred to Education officer, museum for Advanced Directives follow up, telephone assessment pending, and RNCM  will continue to follow for care management needs.      Plan: RNCM will call patient's son/ designated party release for telephone outreach attempt, within 7 business days, telephone assessment.    Keri Veale H. Annia Friendly, BSN, [facility] Management [facility] Telephonic CM Phone: [phone] Fax: [phone]

## 2019-09-01 ENCOUNTER — Other Ambulatory Visit: Payer: Self-pay

## 2019-09-01 ENCOUNTER — Ambulatory Visit: Payer: Medicare Other

## 2019-09-01 DIAGNOSIS — M6281 Muscle weakness (generalized): Secondary | ICD-10-CM | POA: Diagnosis not present

## 2019-09-01 DIAGNOSIS — R482 Apraxia: Secondary | ICD-10-CM | POA: Diagnosis not present

## 2019-09-01 DIAGNOSIS — I69318 Other symptoms and signs involving cognitive functions following cerebral infarction: Secondary | ICD-10-CM | POA: Diagnosis not present

## 2019-09-01 DIAGNOSIS — R2681 Unsteadiness on feet: Secondary | ICD-10-CM | POA: Diagnosis not present

## 2019-09-01 DIAGNOSIS — R2689 Other abnormalities of gait and mobility: Secondary | ICD-10-CM | POA: Diagnosis not present

## 2019-09-01 DIAGNOSIS — R4701 Aphasia: Secondary | ICD-10-CM

## 2019-09-01 DIAGNOSIS — I69351 Hemiplegia and hemiparesis following cerebral infarction affecting right dominant side: Secondary | ICD-10-CM | POA: Diagnosis not present

## 2019-09-01 NOTE — Therapy (Signed)
[facility] [address] [place], [place], [address] Phone: [phone]   Fax:  [phone]  Speech Language Pathology Treatment  Patient Details  Name: Jamie Mitchell MRN: [id number] Date of Birth: Dec 20, 1945 Referring Provider (SLP): Alysia Penna, MD   Encounter Date: 09/01/2019  End of Session - 09/01/19 1644    Visit Number  5    Number of Visits  17    Date for SLP Re-Evaluation  11/17/19    SLP Start Time  1320    SLP Stop Time   1400    SLP Time Calculation (min)  40 min    Activity Tolerance  Patient tolerated treatment well       Past Medical History:  Diagnosis Date  . Hypertension   . Thyroid disease     Past Surgical History:  Procedure Laterality Date  . IR CT HEAD LTD  07/20/2019  . IR PERCUTANEOUS ART THROMBECTOMY/INFUSION INTRACRANIAL INC DIAG ANGIO  07/20/2019  . RADIOLOGY WITH ANESTHESIA N/A 07/20/2019   Procedure: IR WITH ANESTHESIA;  Surgeon: Luanne Bras, MD;  Location: [facility];  Service: Radiology;  Laterality: N/A;    There were no vitals filed for this visit.  Subjective Assessment - 09/01/19 1332    Subjective  Mealtimes still going well.    Patient is accompained by:  Family member;Interpreter   DIL, and interpreter           ADULT SLP TREATMENT - 09/01/19 1333      General Information   Behavior/Cognition  Alert;Cooperative;Pleasant mood;Distractible;Decreased sustained attention;Requires cueing;Doesn't follow directions      Treatment Provided   Treatment provided  Cognitive-Linquistic      Cognitive-Linquistic Treatment   Treatment focused on  Aphasia    Skilled Treatment  SLP engaged pt in pic description task including telling SLP name of the item in the picture. (simple and common foods and clothing) Pt req'd occasional min A for wrod finding, and consistent cues to tell SLP more than the name of the item. Pt speech appears halting but her amount  of language appears to be improving over eval. Daughter in law told SLP she and family have not added more common Spanish phrases as asked in previous session - SLP asked DIL to do that for pt.       Assessment / Recommendations / Plan   Plan  Continue with current plan of care      Progression Toward Goals   Progression toward goals  Progressing toward goals         SLP Short Term Goals - 09/01/19 1643      SLP SHORT TERM GOAL #1   Title  pt will produce sentences describing pictures with MLU = 5.5 (at least 25 utterances) in 2 sessions    Time  2    Period  Weeks    Status  On-going      SLP SHORT TERM GOAL #2   Title  pt will communicate basic wants and needs functionally (reported by pt and/or family) with family with min questioning cues over 3 sessions    Time  2    Period  Weeks    Status  On-going      SLP SHORT TERM GOAL #3   Title  pt and/or family will indicate 3 overt s/sx aspiration PNA with modified independence    Time  2    Period  Weeks    Status  On-going  SLP SHORT TERM GOAL #4   Title  pt will follow commands with superlatives or comparatives 80% with occasional min-mod A in 2 sessions    Time  2    Period  Weeks    Status  On-going       SLP Long Term Goals - 09/01/19 1643      SLP LONG TERM GOAL #1   Title  pt will participate in simple conversation with MLU=7.0    Time  6    Period  Weeks   or 17 sessions, for all LTGs   Status  On-going      SLP LONG TERM GOAL #2   Title  pt will practice speech and language tasks at home at least 4 days/week at least 6 weeks    Time  6    Period  Weeks    Status  On-going      SLP LONG TERM GOAL #3   Title  pt will demo understanding of 10 minutes simple-mod complex conversation with modified indpendence    Time  6    Period  Weeks    Status  On-going      SLP LONG TERM GOAL #4   Title  pt will demonstrate attention appropriate for efficacious completion of therapy tasks for 40 minutes in 8  sessions    Time  7    Period  Weeks    Status  On-going       Plan - 09/01/19 1635    Clinical Impression Statement  Pt is limited [place] speaker (therefore therapy tasks were completed via live interpreter) who presents with receptive and expressive aphasia, 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 Pt remains on dys III/thin currently and daughter in law reports no difficulties with this at home. Pt ?able cognitive linquistic skills - testing to be completed as needed.. Pt would benefit from skilled ST targeting receptive and expressive language and verbal skills.    Speech Therapy Frequency  2x / week    Duration  --   8 weeks or 17 visits   Treatment/Interventions  Aspiration precaution training;Pharyngeal strengthening exercises;Diet toleration management by SLP;Trials of upgraded texture/liquids;Language facilitation;Cueing hierarchy;SLP instruction and feedback;Internal/external aids;Compensatory strategies;Patient/family education;Cognitive reorganization;Functional tasks;Environmental controls;Multimodal communcation approach    Potential to Achieve Goals  Fair    Potential Considerations  Severity of impairments;Cooperation/participation level       Patient will benefit from skilled therapeutic intervention in order to improve the following deficits and impairments:   Aphasia  Verbal apraxia    Problem List Patient Active Problem List   Diagnosis Date Noted  . Renal failure 07/25/2019  . Prediabetes 07/25/2019  . 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 Dysphagia 07/25/2019  . 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 Acute respiratory failure ([place])   . Tachycardia-bradycardia syndrome ([place])   . Atrial fibrillation with RVR ([place])   . Hypertension 07/23/2015  . Hyperlipidemia 07/23/2015  . DJD (degenerative  joint disease) of knee 01/25/2014  . Hypothyroidism 01/25/2014    [facility] ,MS, [place]  09/01/2019, 4:53 PM  [facility] [address] [place], [place], [address] Phone: [phone]   Fax:  [phone]   Name: Jamie Mitchell MRN: [id number] Date of Birth: 03/20/1946

## 2019-09-01 NOTE — Therapy (Deleted)
[facility] [address] [place], [place], [address] Phone: [phone]   Fax:  [phone]  Speech Language Pathology Treatment  Patient Details  Name: Jamie Mitchell MRN: [id number] Date of Birth: 08-14-1945 Referring Provider (SLP): Alysia Penna, MD   Encounter Date: 09/01/2019    Past Medical History:  Diagnosis Date  . Hypertension   . Thyroid disease     Past Surgical History:  Procedure Laterality Date  . IR CT HEAD LTD  07/20/2019  . IR PERCUTANEOUS ART THROMBECTOMY/INFUSION INTRACRANIAL INC DIAG ANGIO  07/20/2019  . RADIOLOGY WITH ANESTHESIA N/A 07/20/2019   Procedure: IR WITH ANESTHESIA;  Surgeon: Luanne Bras, MD;  Location: [facility];  Service: Radiology;  Laterality: N/A;    There were no vitals filed for this visit.  Subjective Assessment - 09/01/19 1332    Subjective  Mealtimes still going well.    Patient is accompained by:  Family member;Interpreter   DIL, and interpreter           ADULT SLP TREATMENT - 09/01/19 1333      General Information   Behavior/Cognition  Alert;Cooperative;Pleasant mood;Distractible;Decreased sustained attention;Requires cueing;Doesn't follow directions      Treatment Provided   Treatment provided  Cognitive-Linquistic      Cognitive-Linquistic Treatment   Treatment focused on  Aphasia    Skilled Treatment  SLP engaged pt in pic description task including telling SLP name of the item in the picture. (simple and common foods and clothing) Pt req'd occasional min A for wrod finding, and consistent cues to tell SLP more than the name of the item. Pt speech appears halting but her amount of language appears to be improving over eval. Daughter in law told SLP she and family have not added more common Spanish phrases as asked in previous session - SLP asked DIL to do that for pt.       Assessment / Recommendations / Plan   Plan  Continue with current  plan of care      Progression Toward Goals   Progression toward goals  Progressing toward goals         SLP Short Term Goals - 09/01/19 1643      SLP SHORT TERM GOAL #1   Title  pt will produce sentences describing pictures with MLU = 5.5 (at least 25 utterances) in 2 sessions    Time  2    Period  Weeks    Status  On-going      SLP SHORT TERM GOAL #2   Title  pt will communicate basic wants and needs functionally (reported by pt and/or family) with family with min questioning cues over 3 sessions    Time  2    Period  Weeks    Status  On-going      SLP SHORT TERM GOAL #3   Title  pt and/or family will indicate 3 overt s/sx aspiration PNA with modified independence    Time  2    Period  Weeks    Status  On-going      SLP SHORT TERM GOAL #4   Title  pt will follow commands with superlatives or comparatives 80% with occasional min-mod A in 2 sessions    Time  2    Period  Weeks    Status  On-going       SLP Long Term Goals - 09/01/19 1643      SLP LONG TERM GOAL #1   Title  pt will participate in simple conversation with MLU=7.0    Time  6    Period  Weeks   or 17 sessions, for all LTGs   Status  On-going      SLP LONG TERM GOAL #2   Title  pt will practice speech and language tasks at home at least 4 days/week at least 6 weeks    Time  6    Period  Weeks    Status  On-going      SLP LONG TERM GOAL #3   Title  pt will demo understanding of 10 minutes simple-mod complex conversation with modified indpendence    Time  6    Period  Weeks    Status  On-going      SLP LONG TERM GOAL #4   Title  pt will demonstrate attention appropriate for efficacious completion of therapy tasks for 40 minutes in 8 sessions    Time  7    Period  Weeks    Status  On-going       Plan - 09/01/19 1635    Clinical Impression Statement  Pt is limited [place] speaker (therefore therapy tasks were completed via live interpreter) who presents with receptive and expressive aphasia,  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 Pt remains on dys III/thin currently and daughter in law reports no difficulties with this at home. Pt ?able cognitive linquistic skills - testing to be completed as needed.. Pt would benefit from skilled ST targeting receptive and expressive language and verbal skills.    Speech Therapy Frequency  2x / week    Duration  --   8 weeks or 17 visits   Treatment/Interventions  Aspiration precaution training;Pharyngeal strengthening exercises;Diet toleration management by SLP;Trials of upgraded texture/liquids;Language facilitation;Cueing hierarchy;SLP instruction and feedback;Internal/external aids;Compensatory strategies;Patient/family education;Cognitive reorganization;Functional tasks;Environmental controls;Multimodal communcation approach    Potential to Achieve Goals  Fair    Potential Considerations  Severity of impairments;Cooperation/participation level       Patient will benefit from skilled therapeutic intervention in order to improve the following deficits and impairments:   Aphasia  Verbal apraxia    Problem List Patient Active Problem List   Diagnosis Date Noted  . Renal failure 07/25/2019  . Prediabetes 07/25/2019  . 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 Dysphagia 07/25/2019  . 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 Acute respiratory failure ([place])   . Tachycardia-bradycardia syndrome ([place])   . Atrial fibrillation with RVR ([place])   . Hypertension 07/23/2015  . Hyperlipidemia 07/23/2015  . 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 Hypothyroidism 01/25/2014    [facility] 09/01/2019, 4:44 PM  [facility] [address] [place], [place], [address] Phone: [phone]   Fax:  [phone]   Name:  Jamie Mitchell MRN: [id number] Date of Birth: [birth year]

## 2019-09-04 ENCOUNTER — Ambulatory Visit (HOSPITAL_COMMUNITY)
Admission: RE | Admit: 2019-09-04 | Discharge: 2019-09-04 | Disposition: A | Discharge status: Home / Self Care | Payer: Medicare Other | Source: Ambulatory Visit | Attending: Student | Admitting: Student

## 2019-09-04 ENCOUNTER — Other Ambulatory Visit: Payer: Self-pay

## 2019-09-04 ENCOUNTER — Other Ambulatory Visit: Payer: Self-pay | Admitting: *Deleted

## 2019-09-04 ENCOUNTER — Other Ambulatory Visit: Payer: Medicare Other

## 2019-09-04 ENCOUNTER — Ambulatory Visit: Payer: Medicare Other | Attending: Family Medicine | Admitting: Family Medicine

## 2019-09-04 VITALS — BP 136/80 | HR 56 | Ht 62.0 in | Wt 151.0 lb

## 2019-09-04 DIAGNOSIS — Z79890 Hormone replacement therapy (postmenopausal): Secondary | ICD-10-CM | POA: Diagnosis not present

## 2019-09-04 DIAGNOSIS — Z7901 Long term (current) use of anticoagulants: Secondary | ICD-10-CM | POA: Diagnosis not present

## 2019-09-04 DIAGNOSIS — Z7982 Long term (current) use of aspirin: Secondary | ICD-10-CM | POA: Insufficient documentation

## 2019-09-04 DIAGNOSIS — Z791 Long term (current) use of non-steroidal anti-inflammatories (NSAID): Secondary | ICD-10-CM | POA: Diagnosis not present

## 2019-09-04 DIAGNOSIS — E038 Other specified hypothyroidism: Secondary | ICD-10-CM

## 2019-09-04 DIAGNOSIS — I10 Essential (primary) hypertension: Secondary | ICD-10-CM | POA: Diagnosis not present

## 2019-09-04 DIAGNOSIS — I63512 Cerebral infarction due to unspecified occlusion or stenosis of left middle cerebral artery: Secondary | ICD-10-CM

## 2019-09-04 DIAGNOSIS — Z79899 Other long term (current) drug therapy: Secondary | ICD-10-CM | POA: Insufficient documentation

## 2019-09-04 DIAGNOSIS — I639 Cerebral infarction, unspecified: Secondary | ICD-10-CM

## 2019-09-04 DIAGNOSIS — R1313 Dysphagia, pharyngeal phase: Secondary | ICD-10-CM | POA: Diagnosis not present

## 2019-09-04 DIAGNOSIS — I4891 Unspecified atrial fibrillation: Secondary | ICD-10-CM

## 2019-09-04 DIAGNOSIS — I69320 Aphasia following cerebral infarction: Secondary | ICD-10-CM

## 2019-09-04 NOTE — Patient Outreach (Signed)
[facility]) Care Management  09/04/2019  Jamie Mitchell [birth year] [id number]   Subjective: Telephone call to patient's designated party release/ son  Jamie Mitchell), spoke with son, stated patient's name, date of birth, and address.   States he remembers speaking with this RNCM in the past, currently busy at work, and will call back at a later time.     Objective:Per KPN (Knowledge Performance Now, point of care tool) and chart review,patient hospitalized 07/25/2019 - 08/03/2019 forLeft middle cerebral artery strokerehab. Patient hospitalized 07/19/2019 - [id number] for Embolic stroke involving left middle cerebral artery, status post tPA,mechanical thrombectomy, due toAtrial fibrillation. Patient also has a history of Hypothyroidism,Hypertension,Prediabetes,Aphasia as late effect of cerebrovascular accident, and Dysphagia.    Assessment: Received [facility] Liaison referral on 08/03/2019. Referral source: Natividad Brood. Referral reason: This was a hospital referral from the nutritionist/patient on SSI family needed teaching and support -Has home health; [facility] is TOC.  Please assign to [place] Coordinator for complex care and disease management follow up calls, patient is aphasic and speaks/understands Spanish, her son is the contact person and speaks fluent [place], his name is Jamie Mitchell, (Son) [phone] and assess for further needs. Patient was in the rehab center.Screening follow up completed, has referred to Education officer, museum for Advanced Directives follow up, telephone assessment pending, andRNCM will continue to follow for care management needs.     Plan: RNCM will send unsuccessful outreach  letter, [facility] pamphlet, will call patient's son/ designated party release for 3rd telephone outreach attempt, within7business days,telephone assessment, if no return call within 10 business days, will  proceed with case closure, after 3rd unsuccessful outreach call.     Jamie Mitchell H. Annia Friendly, BSN, [place] Management [facility] Telephonic CM Phone: [phone] Fax: [phone]

## 2019-09-04 NOTE — Progress Notes (Signed)
Subjective:  Patient ID: Jamie Mitchell, female    DOB: [birth year]  Age: 74 y.o. 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 She received TPA during hospitalization; thrombectomy and revascularization right complicated by subarachnoid hemorrhage.  She also developed intermittent atrial fibrillation with RVR for which she was commenced on amiodarone and Eliquis by EP.  Also found to have hypothyroidism and commenced on Levothyroxine. Echo revealed EF of 60 to 65%, mild MR Since discharge she has been followed up by Dr. Letta Pate of rehab medicine. Also seen by cardiology last month for follow-up visit and plan was to initiate Zio patch; dose of amiodarone decreased to 200mg  at that visit as well.  History is obtained from daughter as mom has expressive aphasia. Undergoing PT/ST/OT and daughter has noticed improvement in physical activity and speech. Her diet is normal and she has had no choking. Patient complains of throat pain when she swallows both liquids and solids. She alternates between 75 and 50mg  of Levothyroxine and she was instructed on this dose at discharge. Past Medical History:  Diagnosis Date  . Hypertension   . Thyroid disease     Past Surgical History:  Procedure Laterality Date  . IR CT HEAD LTD  07/20/2019  . IR PERCUTANEOUS ART THROMBECTOMY/INFUSION INTRACRANIAL INC DIAG ANGIO  07/20/2019  . RADIOLOGY WITH ANESTHESIA N/A 07/20/2019   Procedure: IR WITH ANESTHESIA;  Surgeon: Luanne Bras, MD;  Location: [facility];  Service: Radiology;  Laterality: N/A;    Family History  Family history unknown: Yes    No Known Allergies  Outpatient Medications Prior to Visit  Medication Sig Dispense Refill  .  amiodarone (PACERONE) 200 MG tablet Take 1 tablet (200 mg total) by mouth daily. 60 tablet 0  . apixaban (ELIQUIS) 5 MG TABS tablet Take 1 tablet (5 mg total) by mouth 2 (two) times daily. 60 tablet 1  . aspirin EC 81 MG EC tablet Take 1 tablet (81 mg total) by mouth daily.    [name] atorvastatin (LIPITOR) 40 MG tablet Take 1 tablet (40 mg total) by mouth daily. 30 tablet 0  . levothyroxine (SYNTHROID) 100 MCG tablet Take 0.5-1 tablets (50-100 mcg total) by mouth See admin instructions. 21mcg one day, 81mcg the next day, then repeat. 60 tablet 0  . acetaminophen (TYLENOL) 325 MG tablet Take 2 tablets (650 mg total) by mouth every 4 (four) hours as needed for mild pain (or temp > 37.5 C (99.5 F)). (Patient not taking: Reported on 09/04/2019)    . diclofenac Sodium (VOLTAREN) 1 % GEL Apply 2 g topically 4 (four) times daily as needed (pain). (Patient not taking: Reported on 09/04/2019) 2 g 0   No facility-administered medications prior to visit.     ROS Review of Systems  Constitutional: Negative for activity change, appetite change and fatigue.  HENT: Negative for congestion, sinus pressure and sore throat.   Eyes: Negative for visual disturbance.  Respiratory: Negative for cough, chest tightness, shortness of breath and wheezing.   Cardiovascular: Negative for chest pain and palpitations.  Gastrointestinal: Negative for abdominal distention, abdominal pain and constipation.  Endocrine: Negative for polydipsia.  Genitourinary: Negative for dysuria and frequency.  Musculoskeletal: Negative for arthralgias and back pain.  Skin: Negative for rash.  Neurological: Negative for  tremors, light-headedness and numbness.  Hematological: Does not bruise/bleed easily.  Psychiatric/Behavioral: Negative for agitation and behavioral problems.    Objective:  BP 136/80   Pulse (!) 56   Ht 5\' 2"  (1.575 m)   Wt 151 lb (68.5 kg)   SpO2 95%   BMI 27.62 kg/m   BP/Weight 09/04/2019 08/18/2019 [id number]    Systolic BP [id number]  Diastolic BP 80 63 72  Wt. (Lbs) 151 - 152.8  BMI 27.62 - 27.95      Physical Exam Constitutional:      Appearance: She is well-developed.  Neck:     Vascular: No JVD.  Cardiovascular:     Rate and Rhythm: Normal rate.     Heart sounds: Normal heart sounds. No murmur.  Pulmonary:     Effort: Pulmonary effort is normal.     Breath sounds: Normal breath sounds. No wheezing or rales.  Chest:     Chest wall: No tenderness.  Abdominal:     General: Bowel sounds are normal. There is no distension.     Palpations: Abdomen is soft. There is no mass.     Tenderness: There is no abdominal tenderness.  Musculoskeletal:        General: Normal range of motion.     Right lower leg: No edema.     Left lower leg: No edema.  Neurological:     Mental Status: She is alert and oriented to person, place, and time.     Sensory: No sensory deficit.     Motor: No weakness.     Gait: Gait normal.     Comments: Expressive aphasia  Psychiatric:        Mood and Affect: Mood normal.     CMP Latest Ref Rng & Units 07/28/2019 07/26/2019 07/25/2019  Glucose 70 - 99 mg/dL 166(H) 163(H) 122(H)  BUN 8 - 23 mg/dL 23 22 23   Creatinine 0.44 - 1.00 mg/dL 1.05(H) 0.90 0.80  Sodium 135 - 145 mmol/L 137 138 140  Potassium 3.5 - 5.1 mmol/L 3.9 4.0 3.9  Chloride 98 - 111 mmol/L 105 103 105  CO2 22 - 32 mmol/L 21(L) 21(L) 24  Calcium 8.9 - 10.3 mg/dL 8.5(L) 8.7(L) 8.6(L)  Total Protein 6.5 - 8.1 g/dL - 7.0 -  Total Bilirubin 0.3 - 1.2 mg/dL - 0.8 -  Alkaline Phos 38 - 126 U/L - 85 -  AST 15 - 41 U/L - 35 -  ALT 0 - 44 U/L - 38 -    Lipid Panel     Component Value Date/Time   CHOL 113 07/20/2019 0356   CHOL 137 02/26/2017 1035   TRIG 57 07/20/2019 0356   HDL 36 (L) 07/20/2019 0356   HDL 35 (L) 02/26/2017 1035   CHOLHDL 3.1 07/20/2019 0356   VLDL 11 07/20/2019 0356   LDLCALC 66 07/20/2019 0356   LDLCALC 49 02/26/2017 1035   LDLDIRECT 78 02/20/2016 1221    CBC     Component Value Date/Time   WBC 7.3 07/26/2019 0831   RBC 5.02 07/26/2019 0831   HGB 14.8 07/26/2019 0831   HCT 44.6 07/26/2019 0831   PLT 226 07/26/2019 0831   MCV 88.8 07/26/2019 0831   MCH 29.5 07/26/2019 0831   MCHC 33.2 07/26/2019 0831   RDW 13.4 07/26/2019 0831   LYMPHSABS 2.3 07/26/2019 0831   MONOABS 0.5 07/26/2019 0831   EOSABS 0.3 07/26/2019 0831   BASOSABS 0.1 07/26/2019 0831    Lab Results  Component Value  Date   HGBA1C 6.1 (H) 07/20/2019   HGBA1C 6.0 (H) 07/20/2019    Assessment & Plan:   1. Left middle cerebral artery stroke (HCC) Status post TPA Risk factor modification Continue statin   2. Aphasia as late effect of cerebrovascular accident (CVA) Improving Continue physical therapy  3. Pharyngeal dysphagia Educated on dietary modifications to prevent choking Continue PT  4. Other specified hypothyroidism Uncontrolled from last TSH We will send off thyroid panel and adjust regimen accordingly - T4, free - TSH  5.  A. fib with RVR Continue anticoagulation with Eliquis Continue amiodarone Zio patch as per cardiology   Return in about 3 months (around 12/04/2019) for chronic medical conditions.  Charlott Rakes, MD, FAAFP. [facility] [place], [place]   09/04/2019, 4:15 PM

## 2019-09-04 NOTE — Patient Instructions (Signed)
Plan de alimentacin para la disfagia con alimentos del tamao de un bocado Dysphagia Eating Plan, Bite Size Food Este plan de alimentacin est diseado para personas con dificultades moderadas para tragar que se encuentran en proceso de transicin de comidas hechas pur a comidas picadas. Los alimentos del tamao de un bocado son blandos y estn cortados en trozos pequeos que se pueden tragar de Geographical information systems officer segura. En este plan de alimentacin, es posible que le indiquen que beba lquidos espesos. Junto con su mdico y Human resources officer en alimentacin y nutricin (nutricionista), asegrense de que usted tenga una dieta segura y [place] todos los nutrientes que necesita. Consejos para Presenter, broadcasting. Pautas generales para los alimentos   Usted puede comer alimentos que sean tiernos, blandos y hmedos.  Siempre pruebe la textura del alimento antes de tomar un bocado. Pinche el alimento con un tenedor o una cuchara para asegurarse de que est tierno.  Los alimentos deben ser fciles de cortar y Engineer, manufacturing systems. No consuma grandes trozos de Psychologist, educational.  Ingiera bocados pequeos. Cada bocado debe ser ms pequeo que la ua del dedo pulgar (de 15x60m aproximadamente).  Si usted tena un plan de alimentacin con alimentos hechos pur y picados, puede comer cualquiera de los alimentos incluidos en esa dieta.  Evite los alimentos que sean muy secos, duros, pegajosos, gomosos, speros o crocantes.  Si el mdico se lo indica, espese los lquidos. Siga las instrucciones del mdico respecto a qu productos debe uRisk manager cmo hNature conservation officery con qu consistencia. ? Es posible que el mViacomsugiera el uso de un espesante comercial, cereal de arroz o copos de papas. Pdale a su mdico que le recomiende algn espesante. ? Los lquidos espesados son generalmente de consistencia similar a la de un pudin o pueden ser tan espesos como la miel o lo suficientemente espesos como para ser  ingeridos con cSports administrator Coccin  Para humedecer los alimentos, puede agregar lquidos al momento de lBuilding surveyor hField seismologistpur o moler sus alimentos para lograr la consistencia adecuada. Estos lquidos incluyen salsas, jugos de frutas o vegetales, lMonroe mitad leche y mCataula oMexico  Cuele el lquido sobrante de las comidas antes de ingerirlas.  Recaliente los alimentos lentamente para evitar que se forme una costra dura.  Prepare las comidas con anticipacin. Planificacin de las comidas  Coma diferentes alimentos para obtener todos los nutrientes que necesita.  Algunos alimentos se pueden tolerar mejor que otros. Identifique junto con su nutricionista los alimentos que menos riesgos suponen para usted.  Siga el plan de alimentacin como se lo haya indicado el nutricionista. Qu alimentos estn permitidos? Cereales Panes hmedos sin frutos secos ni semillas. Bizcochos, muffins, panqueques y waffles bien humedecidos con jHebron jGilman mBay Centero mKarns City Cereales cocidos. Relleno para pan hmedo. Arroz humedecido. Cereal fro bien humedecido en trozos pequeos. Pasta bien cocida, tallarines, arroz y pan aliado en trozos pequeos y sPepsiCo Albndigas blandas o spaetzle en trozos pequeos con mEureka Verduras Vegetales blandos y bien cocidos en trozos pequeos. Papa blanda cocida, en forma de pur. Jugo de vegetales espesado. Frutas Frutas cocidas o enlatadas que estn blandas o hmedas y no tengan piel ni semillas. Bananas blandas y frescas. Jugos de frutas espesados. Carne y otros alimentos proteicos Carnes y aves tiernas y jugosas en pequeos trozos. Albndigas o pan de carne jugosos. Pescado sin espinas. Huevos y sustitutos del huevo en trozos pequeos. Tofu. [place] y carnes alternativas en trozos pequeos. Frijoles y gMGM MIRAGEcocidos y  tiernos, y otras legumbres. Lcteos Leche espesada. Queso crema. Yogur. Time Warner. [employer]. Trozos pequeos de queso  blando. Grasas y [employer]. Grasas. Margarina. [place]. Salsas. Cremas untables. Dulces y [employer], blandos y [place]. Pudin. Natillas. Tortas hmedas. Mermelada. Gelatina. Miel. Conservas. Pregntele a su mdico si puede comer postres helados. Condimentos y otros [employer] condimentos y Wing. Todas las salsas con trozos pequeos. [place] preparada con atn, huevo o pollo sin frutas ni vegetales crudos. Guisos hmedos con trozos de carne pequeos y tiernos. Sopas con carne tierna. Qu alimentos no estn permitidos? Cereales Cereales de granos gruesos o secos. Panes secos. Tostadas. Galletas. Panes duros y con corteza, como pan francs o baguettes. Galletas secas, waffles y muffins. Arroz pegajoso. Relleno para pan seco. Granola. Palomitas de maz. Papas fritas. Verduras Todas las verduras crudas. Maz cocido. Vegetales cocidos de consistencia gomosa o duros. Vegetales fibrosos, como el apio. Papas duras, fritas y crocantes. Cscara de papas. Frutas Frutas crudas duras, crocantes, fibrosas, con mucha pulpa y [place], como manzanas, pia, papaya y sanda. Frutas redondas y [place], como las uvas. Lambert Mody deshidratadas y frutas desecadas. Carne y otros alimentos proteicos Trozos grandes de carne. Carnes secas y duras, como tocino, salchichas y perros calientes. Pollo, pavo o pescado con piel y huesos o espinas. [place] de man crocante. Frutos secos. Semillas. Nueces y [place] de semillas. Lcteos Yogur con frutos secos, semillas o grandes trozos. Trozos grandes de [place]. Postres congelados y consistencia de ConocoPhillips no permitidos por su nutricionista. Dulces y postres Tortas secas. Galletas secas o gomosas. Todo postre que tenga frutos secos, semillas, frutas secas, coco, pia o cualquier cosa que sea seca, pegajosa o dura. [place]. Regaliz. Caramelos masticables. Pregntele a su mdico si puede comer postres helados.  Condimentos y otros alimentos Sopas con trozos grandes o duros de carne de vaca, de ave o vegetales. Sopa de maz o almejas. Batidos de frutas con grandes trozos de frutas. Resumen  Los alimentos del tamao de un bocado pueden ser tiles para las personas con problemas moderados para tragar.  En el plan de alimentacin para la disfagia, se pueden ingerir alimentos blandos, hmedos y cortados en trozos ms pequeos que 15x22mm.  Es posible que le indiquen que [employer] lquidos. Siga las indicaciones del mdico con respecto a cmo Nature conservation officer y con qu consistencia. Esta informacin no tiene Marine scientist el consejo del mdico. Asegrese de hacerle al mdico cualquier pregunta que tenga. Document Revised: 11/12/2016 Document Reviewed: 11/12/2016 Elsevier Patient Education  [place].

## 2019-09-04 NOTE — Final Consult Note (Signed)
Referring Provider(s): Ronney Lion, PA  Chief Complaint: The patient is seen in follow up today s/p mechanical thrombectomy  History of present illness: Ms.Jamie Mitchell a 74 y.o.femalewith history of HTN, HLD and thyroid disease who presented with R hemiparesis, Left gaze preference, R facial weakness, R field cut and global aphasia. AF noted by EMS. Received tPA 07/19/2019 at 2317. Found to have L M2 occlusion and taken to our service for mechnical thrombectomy with TICI2C recanalization..   Past Medical History:  Diagnosis Date  . Hypertension   . Thyroid disease    Past surgical history:  IR mechanical thrombectomy. Surgeon: Pedro Earls MD Date: 07/20/2019  Allergies: Patient has no known allergies.  Medications: Prior to Admission medications   Medication Sig Start Date End Date Taking? Authorizing Provider  acetaminophen (TYLENOL) 325 MG tablet Take 2 tablets (650 mg total) by mouth every 4 (four) hours as needed for mild pain (or temp > 37.5 C (99.5 F)). 08/02/19   Angiulli, Lavon Paganini, PA-C  amiodarone (PACERONE) 200 MG tablet Take 1 tablet (200 mg total) by mouth daily. 08/16/19   Donato Heinz, MD  apixaban (ELIQUIS) 5 MG TABS tablet Take 1 tablet (5 mg total) by mouth 2 (two) times daily. 08/02/19   Angiulli, Lavon Paganini, PA-C  aspirin EC 81 MG EC tablet Take 1 tablet (81 mg total) by mouth daily. 07/26/19   Donzetta Starch, NP  atorvastatin (LIPITOR) 40 MG tablet Take 1 tablet (40 mg total) by mouth daily. 08/02/19   Angiulli, Lavon Paganini, PA-C  diclofenac Sodium (VOLTAREN) 1 % GEL Apply 2 g topically 4 (four) times daily as needed (pain). 08/02/19   Angiulli, Lavon Paganini, PA-C  levothyroxine (SYNTHROID) 100 MCG tablet Take 0.5-1 tablets (50-100 mcg total) by mouth See admin instructions. 32mcg one day, 103mcg the next day, then repeat. 08/02/19   Angiulli, Lavon Paganini, PA-C     Family History  Family history unknown: Yes    Social History     Socioeconomic History  . Marital status: Married    Spouse name: Not on file  . Number of children: Not on file  . Years of education: Not on file  . Highest education level: Not on file  Occupational History  . Not on file  Tobacco Use  . Smoking status: Never Smoker  . Smokeless tobacco: Never Used  Substance and Sexual Activity  . Alcohol use: No  . Drug use: No  . Sexual activity: Not Currently  Other Topics Concern  . Not on file  Social History Narrative  . Not on file   Social Determinants of Health   Financial Resource Strain:   . Difficulty of Paying Living Expenses:   Food Insecurity:   . Worried About Charity fundraiser in the Last Year:   . Arboriculturist in the Last Year:   Transportation Needs:   . Film/video editor (Medical):   [name] Lack of Transportation (Non-Medical):   Physical Activity:   . Days of Exercise per Week:   . Minutes of Exercise per Session:   Stress:   . Feeling of Stress :   Social Connections:   . Frequency of Communication with Friends and Family:   . Frequency of Social Gatherings with Friends and Family:   . Attends Religious Services:   . Active Member of Clubs or Organizations:   . Attends Archivist Meetings:   [name] Marital Status:  Vital Signs: SPO2: 96 BP cuff: 124/77 mmHg Pulse: 98 bpm  Physical Exam Constitutional:      Appearance: Normal appearance.  HENT:     Head: Normocephalic and atraumatic.     Mouth/Throat:     Mouth: Mucous membranes are moist.  Eyes:     Pupils: Pupils are equal, round, and reactive to light.  Cardiovascular:     Rate and Rhythm: Normal rate.     Pulses: Normal pulses.     Heart sounds: Normal heart sounds.  Pulmonary:     Effort: Pulmonary effort is normal.     Breath sounds: Normal breath sounds.  Skin:    General: Skin is warm.       Neurological:     Mental Status: She is alert and oriented to person, place, and time.     Cranial Nerves: No dysarthria.      Motor: Weakness present.     Comments: Expressive and receptive aphasia. RUE and RLE weakness 4+/5.     Imaging: Relevant results: CTA head&neck 07/19/2019: IMPRESSION: 1. Positive CTA for emergent large vessel occlusion, with occlusive thrombus within a proximal left M2 branch, superior division. Attenuated flow distally either due to subocclusive thrombus and/or collateralization. 2. Diffuse tortuosity about the major arterial vasculature of the head and neck, suggesting chronic underlying hypertension. No other hemodynamically significant or correctable stenosis.  Labs:  CBC: Recent Labs    07/23/19 0645 07/24/19 0529 07/25/19 0806 07/26/19 0831  WBC 8.0 6.1 6.8 7.3  HGB 13.3 12.1 13.4 14.8  HCT 41.4 37.4 40.6 44.6  PLT 194 192 230 226    COAGS: Recent Labs    07/19/19 2300  INR 1.0  APTT 30    BMP: Recent Labs    07/24/19 0529 07/25/19 0806 07/26/19 0831 07/28/19 1939  NA 140 140 138 137  K 3.6 3.9 4.0 3.9  CL 101 105 103 105  CO2 29 24 21* 21*  GLUCOSE 135* 122* 163* 166*  BUN 24* 23 22 23   CALCIUM 8.4* 8.6* 8.7* 8.5*  CREATININE 0.86 0.80 0.90 1.05*  GFRNONAA >60 >60 >60 53*  GFRAA >60 >60 >60 >60    LIVER FUNCTION TESTS: Recent Labs    07/19/19 2300 07/26/19 0831  BILITOT 0.9 0.8  AST 22 35  ALT 15 38  ALKPHOS 106 85  PROT 7.8 7.0  ALBUMIN 4.0 3.3*    Assessment: 74 year-old female status post mechanical thrombectomy for left M2/MCA occlusion. [name] has made a remarkable recovery. She has a moderate residual mixed aphasia and very mild right sided weakness. She has helped with house chores and attend to bodily functions and grooming on her own. Daughter in law notices mild change in gait related to left side preference. Instructed to continue occupational and speech therapy and continued follow-up with Neurology and Cardiology. No further interventional follow-up necessary at this point.    Signed: Pedro Earls, MD 09/04/2019, 10:00 AM    I spent a total of 40 Minutes  in face to face in clinical consultation, greater than 50% of which was counseling/coordinating care for post stroke intervention.

## 2019-09-05 ENCOUNTER — Ambulatory Visit: Payer: Medicare Other | Admitting: Speech Pathology

## 2019-09-05 ENCOUNTER — Other Ambulatory Visit: Payer: Self-pay | Admitting: Family Medicine

## 2019-09-05 ENCOUNTER — Encounter: Payer: Self-pay | Admitting: Physical Therapy

## 2019-09-05 ENCOUNTER — Ambulatory Visit: Payer: Medicare Other | Admitting: Physical Therapy

## 2019-09-05 DIAGNOSIS — R2689 Other abnormalities of gait and mobility: Secondary | ICD-10-CM | POA: Diagnosis not present

## 2019-09-05 DIAGNOSIS — M6281 Muscle weakness (generalized): Secondary | ICD-10-CM | POA: Diagnosis not present

## 2019-09-05 DIAGNOSIS — R4701 Aphasia: Secondary | ICD-10-CM

## 2019-09-05 DIAGNOSIS — I69318 Other symptoms and signs involving cognitive functions following cerebral infarction: Secondary | ICD-10-CM | POA: Diagnosis not present

## 2019-09-05 DIAGNOSIS — R482 Apraxia: Secondary | ICD-10-CM | POA: Diagnosis not present

## 2019-09-05 DIAGNOSIS — R070 Pain in throat: Secondary | ICD-10-CM

## 2019-09-05 DIAGNOSIS — R2681 Unsteadiness on feet: Secondary | ICD-10-CM

## 2019-09-05 DIAGNOSIS — I69351 Hemiplegia and hemiparesis following cerebral infarction affecting right dominant side: Secondary | ICD-10-CM | POA: Diagnosis not present

## 2019-09-05 LAB — T4, FREE: Free T4: 1.42 ng/dL (ref 0.82–1.77)

## 2019-09-05 LAB — TSH: TSH: 8.96 u[IU]/mL — ABNORMAL HIGH (ref 0.450–4.500)

## 2019-09-05 MED ORDER — LEVOTHYROXINE SODIUM 88 MCG PO TABS: 88.0000 ug | ORAL_TABLET | Freq: Every day | ORAL | 3 refills | Status: DC

## 2019-09-05 NOTE — Therapy (Signed)
[facility] [address] [place], [place], [address] Phone: [phone]   Fax:  [phone]  Speech Language Pathology Treatment  Patient Details  Name: Jamie Mitchell MRN: [id number] Date of Birth: Mar 25, 1946 Referring Provider (SLP): Alysia Penna, MD   Encounter Date: 09/05/2019  End of Session - 09/05/19 1351    Visit Number  6    Number of Visits  17    Date for SLP Re-Evaluation  11/17/19    SLP Start Time  0935    SLP Stop Time   1020    SLP Time Calculation (min)  45 min    Activity Tolerance  Patient tolerated treatment well       Past Medical History:  Diagnosis Date  . Hypertension   . Thyroid disease     Past Surgical History:  Procedure Laterality Date  . IR CT HEAD LTD  07/20/2019  . IR PERCUTANEOUS ART THROMBECTOMY/INFUSION INTRACRANIAL INC DIAG ANGIO  07/20/2019  . RADIOLOGY WITH ANESTHESIA N/A 07/20/2019   Procedure: IR WITH ANESTHESIA;  Surgeon: Luanne Bras, MD;  Location: [facility];  Service: Radiology;  Laterality: N/A;    There were no vitals filed for this visit.  Subjective Assessment - 09/05/19 0942    Subjective  "She's talking about the pain in her throat."    Patient is accompained by:  Family member;Interpreter    Currently in Pain?  Yes    Pain Score  3     Pain Location  Throat    Pain Orientation  Right    Pain Descriptors / Indicators  Aching;Sore;Burning    Pain Type  Acute pain    Pain Onset  1 to 4 weeks ago            ADULT SLP TREATMENT - 09/05/19 1349      General Information   Behavior/Cognition  Alert;Cooperative;Pleasant mood;Distractible;Decreased sustained attention;Requires cueing;Doesn't follow directions      Treatment Provided   Treatment provided  Cognitive-Linquistic      Pain Assessment   Pain Assessment  No/denies pain      Cognitive-Linquistic Treatment   Treatment focused on  Aphasia    Skilled Treatment  Pt saw MD  yesterday and reported throat pain when swallowing, was told to ask SLP about this today. SLP inquired further and pt reporting sensation on the right side of her throat, not just when swallowing but all the time. SLP notes pt was intubated during hospitalization. She has noted this sensation constantly since approximately 1 week post d/c from hospital. SLP advised that given pt complaints that ENT consult would be most appropriate, as direct visualization via laryngoscopy necessary to determine etiology of pt complaints. Explained ST will request referral from pt's PCP. Via interpreter, SLP engaged pt in modified semantic feature analysis tasks focusing on 4 questions for description (category, use, association, physical description). Pt required mod-max A to generate decriptions for simple nouns (category and physical description were most difficult for pt).       Assessment / Recommendations / Plan   Plan  Continue with current plan of care      Progression Toward Goals   Progression toward goals  Progressing toward goals       SLP Education - 09/05/19 1351    Education Details  pt may benefit from ENT referral for throat pain    Person(s) Educated  Patient    Methods  Explanation    Comprehension  Verbalized understanding  SLP Short Term Goals - 09/05/19 1352      SLP SHORT TERM GOAL #1   Title  pt will produce sentences describing pictures with MLU = 5.5 (at least 25 utterances) in 2 sessions    Time  1    Period  Weeks    Status  On-going      SLP SHORT TERM GOAL #2   Title  pt will communicate basic wants and needs functionally (reported by pt and/or family) with family with min questioning cues over 3 sessions    Time  1    Period  Weeks    Status  On-going      SLP SHORT TERM GOAL #3   Title  pt and/or family will indicate 3 overt s/sx aspiration PNA with modified independence    Time  1    Period  Weeks    Status  On-going      SLP SHORT TERM GOAL #4   Title  pt  will follow commands with superlatives or comparatives 80% with occasional min-mod A in 2 sessions    Time  1    Period  Weeks    Status  On-going       SLP Long Term Goals - 09/05/19 1352      SLP LONG TERM GOAL #1   Title  pt will participate in simple conversation with MLU=7.0    Time  5    Period  Weeks   or 17 sessions, for all LTGs   Status  On-going      SLP LONG TERM GOAL #2   Title  pt will practice speech and language tasks at home at least 4 days/week at least 6 weeks    Time  5    Period  Weeks    Status  On-going      SLP LONG TERM GOAL #3   Title  pt will demo understanding of 10 minutes simple-mod complex conversation with modified indpendence    Time  5    Period  Weeks    Status  On-going      SLP LONG TERM GOAL #4   Title  pt will demonstrate attention appropriate for efficacious completion of therapy tasks for 40 minutes in 8 sessions    Time  5    Period  Weeks    Status  On-going       Plan - 09/05/19 1351    Clinical Impression Statement  Pt is limited [place] speaker (therefore therapy tasks were completed via live interpreter) who presents with receptive and expressive aphasia, 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 Pt remains on dys III/thin currently and daughter in law reports no difficulties with this at home. Pt ?able cognitive linquistic skills - testing to be completed as needed.. Pt would benefit from skilled ST targeting receptive and expressive language and verbal skills.    Speech Therapy Frequency  2x / week    Duration  --   8 weeks or 17 visits   Treatment/Interventions  Aspiration precaution training;Pharyngeal strengthening exercises;Diet toleration management by SLP;Trials of upgraded texture/liquids;Language facilitation;Cueing hierarchy;SLP instruction and feedback;Internal/external aids;Compensatory strategies;Patient/family education;Cognitive reorganization;Functional tasks;Environmental controls;Multimodal communcation approach     Potential to Achieve Goals  Fair    Potential Considerations  Severity of impairments;Cooperation/participation level       Patient will benefit from skilled therapeutic intervention in order to improve the following deficits and impairments:   Aphasia  Verbal apraxia    Problem List Patient Active  Problem List   Diagnosis Date Noted  . Renal failure 07/25/2019  . Prediabetes 07/25/2019  . 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 Dysphagia 07/25/2019  . 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 Acute respiratory failure ([facility])   . 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 Atrial fibrillation with RVR ([facility])   . Hypertension 07/23/2015  . Hyperlipidemia 07/23/2015  . 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 Hypothyroidism 01/25/2014   Deneise Lever, [place], [place] 09/05/2019, 1:52 PM  [facility] [address] [place], [place], [address] Phone: [phone]   Fax:  [phone]   Name: Jamie Mitchell MRN: [id number] Date of Birth: July 03, 1945

## 2019-09-05 NOTE — Progress Notes (Signed)
[facility] [address] [place]. [place] [address] [phone]       HOSPITAL FOLLOW UP NOTE  Ms. Jamie Mitchell Date of Birth:  [birth year] Medical Record Number:  [id number]   Reason for Referral:  hospital stroke follow up    SUBJECTIVE:   CHIEF COMPLAINT:  Chief Complaint  Patient presents with  . Hospitalization Follow-up    Rm txt rm, with daughter and interpreter  . Cerebrovascular Accident    doing ok getting pt/ot/st, has throat pain    HPI:   Ms.Jamie Mitchell a 74 y.o.femalewith history of HTN, HLD and thyroid disease presented on 07/19/2019 with R hemiparesis, Left gaze preference, R facial weakness, R field cut and global aphasia. AF noted by EMS.  Evaluated by stroke team and Dr. Erlinda Hong with stroke work-up revealing left MCA infarct due to left M2 occlusion s/p TPA with IR with TICI 2C revascularization with resultant focal SAH, infarct embolic secondary to newly diagnosed atrial fibrillation.  CHA2DS2-VASc score at least 5 therefore recommended initiating Eliquis 7 to 10 days post stroke for secondary stroke prevention.  History of HTN with hypotension during admission and recommended long-term BP goal normotensive range.  LDL 66 and recommended continuation of atorvastatin 40 mg daily.  Pre-DM with A1c 6.0.  No prior history of stroke.  Other active problems during admission resolved acute respiratory failure, resolved acute renal failure and episode of coffee-ground emesis.  Also found to have hypothyroidism and was initiated on levothyroxine.  Residual deficits of speech impairment, dysphagia, left gaze preference and right hemiparesis and discharged to CIR for ongoing therapy needs.  Stroke: L MCA infarct due to left M2 occlusion s/p tPA and IR w/ TICI2c revascularization with focal SAH, infarct embolic secondary to newly diagnosed atrial fibrillation   Code Stroke CT head likely L frontal operculum MCA infarct, Hyperdense  L M1/M2 c/w LVO.Old high L frontal infarct. ASPECTS 9.   CTA head &neck L M2 LVO.Diffuse tortuosity head and neck. Fetal PCAs w/ small VB system.   Cerebral angio TICI2c revascularization LM2 superior division branch occlusion. Moderate L sylvian SAH   CT head post IR anterolateral L frontal lobe infarct in operculum, insula and subinsular region. Small to moderate L sylvian fissures into basal cisterns SAH.   Repeat CT head L sylvian fissure SAH less dense. L frontal operculum, insula and subinsular infarct well seen. Mild 1-56mm midline shift.  MRI /MRA 2/27 -Large acute left MCA superior division infarct with minimalpetechial hemorrhage and unchanged minimal midline shift.Punctate acute left parietal and occipital infarcts.Residual small volumesubarachnoid hemorrhage.Severe left MCA superior division branch vessel stenosis near the site of previous occlusion.  CT head 3/1 stable. Resolving SAH. No new infarcts  2D EchoEF 60-65%. No source of embolus  LDL66 -continue atorvastatin  HgbA1c 6.0  UDS +benzos  SCDs for VTE prophylaxis  aspirin 81 mg dailyprior to admission, now on No antithromboticd/t focal SAH. Repeat CT Head stable. Aspirin added 3/1. Starteliquis 7-10 days post stroke.  Therapy recommendations: CIR   Disposition: CIR  Today, 09/06/2019, Ms. Jamie Mitchell is being seen for hospital follow-up accompanied by her daughter in law and interpreter.  Residual deficits of expressive aphasia and dysphagia on modified soften foods but regular liquids.  She continues to participate in outpatient therapy with ongoing improvement.  She has continued on Eliquis and aspirin 81 mg daily without bleeding or bruising.  She does have follow-up with cardiology who recommended cardiac monitor for further evaluation of atrial fibrillation/flutter and pauses  and if tachybradycardia issues continue further evaluation by EP for possible pacemaker.  Cardiac monitor has not been  completed at this time.  She continues on atorvastatin 40 mg daily without myalgias.  Blood pressure today 130/77.  Monitors at home and has been stable.  No concerns at this time.       ROS:   14 system review of systems performed and negative with exception of speech difficulty  PMH:  Past Medical History:  Diagnosis Date  . Hypertension   . Thyroid disease     PSH:  Past Surgical History:  Procedure Laterality Date  . IR CT HEAD LTD  07/20/2019  . IR PERCUTANEOUS ART THROMBECTOMY/INFUSION INTRACRANIAL INC DIAG ANGIO  07/20/2019  . RADIOLOGY WITH ANESTHESIA N/A 07/20/2019   Procedure: IR WITH ANESTHESIA;  Surgeon: Luanne Bras, MD;  Location: [facility];  Service: Radiology;  Laterality: N/A;    Social History:  Social History   Socioeconomic History  . Marital status: Married    Spouse name: Not on file  . Number of children: Not on file  . Years of education: Not on file  . Highest education level: Not on file  Occupational History  . Not on file  Tobacco Use  . Smoking status: Never Smoker  . Smokeless tobacco: Never Used  Substance and Sexual Activity  . Alcohol use: No  . Drug use: No  . Sexual activity: Not Currently  Other Topics Concern  . Not on file  Social History Narrative  . Not on file   Social Determinants of Health   Financial Resource Strain:   . Difficulty of Paying Living Expenses:   Food Insecurity:   . Worried About Charity fundraiser in the Last Year:   . Arboriculturist in the Last Year:   Transportation Needs:   . Film/video editor (Medical):   [name] Lack of Transportation (Non-Medical):   Physical Activity:   . Days of Exercise per Week:   . Minutes of Exercise per Session:   Stress:   . Feeling of Stress :   Social Connections:   . Frequency of Communication with Friends and Family:   . Frequency of Social Gatherings with Friends and Family:   . Attends Religious Services:   . Active Member of Clubs or Organizations:   .  Attends Archivist Meetings:   [name] Marital Status:   Intimate Partner Violence:   . Fear of Current or Ex-Partner:   . Emotionally Abused:   [name] Physically Abused:   . Sexually Abused:     Family History:  Family History  Family history unknown: Yes    Medications:   Current Outpatient Medications on File Prior to Visit  Medication Sig Dispense Refill  . amiodarone (PACERONE) 200 MG tablet Take 1 tablet (200 mg total) by mouth daily. 60 tablet 0  . apixaban (ELIQUIS) 5 MG TABS tablet Take 1 tablet (5 mg total) by mouth 2 (two) times daily. 60 tablet 1  . atorvastatin (LIPITOR) 40 MG tablet Take 1 tablet (40 mg total) by mouth daily. 30 tablet 0  . diclofenac Sodium (VOLTAREN) 1 % GEL Apply 2 g topically 4 (four) times daily as needed (pain). 2 g 0  . levothyroxine (SYNTHROID) 88 MCG tablet Take 1 tablet (88 mcg total) by mouth daily before breakfast. 30 tablet 3  . acetaminophen (TYLENOL) 325 MG tablet Take 2 tablets (650 mg total) by mouth every 4 (four) hours as needed for mild  pain (or temp > 37.5 C (99.5 F)). (Patient not taking: Reported on 09/06/2019)     No current facility-administered medications on file prior to visit.    Allergies:  No Known Allergies    OBJECTIVE:  Vitals  Vitals:   09/06/19 0833  BP: 130/77  Pulse: (!) 55  Temp: 97.7 F (36.5 C)  Weight: 152 lb 9.6 oz (69.2 kg)  Height: 5\' 2"  (1.575 m)   Body mass index is 27.91 kg/m. No exam data present  Depression screen [facility] 2/9 09/06/2019  Decreased Interest 0  Down, Depressed, Hopeless 0  PHQ - 2 Score 0  Altered sleeping -  Tired, decreased energy -  Change in appetite -  Feeling bad or failure about yourself  -  Trouble concentrating -  Moving slowly or fidgety/restless -  Suicidal thoughts -  PHQ-9 Score -    Physical exam  General: well developed, well nourished,  pleasant elderly female, seated, in no evident distress Head: head normocephalic and atraumatic.   Neck: supple  with no carotid or supraclavicular bruits Cardiovascular: regular rate and rhythm, no murmurs Musculoskeletal: no deformity Skin:  no rash/petichiae Vascular:  Normal pulses all extremities   Neurologic Exam Mental Status: Awake and fully alert. mild expressive aphasia and mild to moderate verbral apraxia. Oriented to place and time. Recent and remote memory intact. Attention span, concentration and fund of knowledge appropriate. Mood and affect appropriate.  Cranial Nerves: Fundoscopic exam reveals sharp disc margins. Pupils equal, briskly reactive to light. Extraocular movements full without nystagmus. Visual fields full to confrontation. Hearing intact. Facial sensation intact.  Mild right lower facial weakness. Motor: Normal bulk and tone. Normal strength in all tested extremity muscles. Sensory.: intact to touch , pinprick , position and vibratory sensation.  Coordination: Rapid alternating movements normal in all extremities. Finger-to-nose and heel-to-shin performed accurately bilaterally. Gait and Station: Arises from chair without difficulty. Stance is normal. Gait demonstrates normal stride length and balance Reflexes: 1+ and symmetric. Toes downgoing.     NIHSS  2 Modified Rankin  2 CHA2DS2-VASc Score = 5  (?2 oral anticoagulation recommended) Age in Years:  21-74 +1   Sex:  female +1 Hypertension History: Yes +1  Diabetes Mellitus:  No 0  Congestive Heart Failure History:  No 0              Vascular Disease History (prior MI, aortic arthrosclerosis, PAD):  No 0                           Stroke/TIA/Thromboembolism History: Yes +2 HAS-BLED 2     ASSESSMENT: Jamie Mitchell is a 74 y.o. year old female presented with right hemiparesis, left gaze preference, right facial weakness, right field cut and global aphasia on 07/19/2019 with stroke work-up revealing left MCA infarct due to left M2 occlusion s/p TPA and IR with TICI 2C revascularization with resultant focal SAH,  infarct embolic secondary to newly diagnosed atrial fibrillation. Vascular risk factors include newly diagnosed A. fib, HTN, HLD, pre-DM.  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  No evidence of residual extremity weakness and only mild right lower facial weakness     PLAN:  1. Left MCA stroke:  -Aphasia/apraxia: Ongoing participation in outpatient speech therapy for ongoing improvement -Continue Eliquis (apixaban) daily  and atorvastatin for secondary stroke prevention. Discontinue aspirin at this time as not indicated  from a stroke standpoint.   -Maintain strict control of hypertension with blood pressure goal below 130/90, diabetes with hemoglobin A1c goal below 6.5% and cholesterol with LDL cholesterol (bad cholesterol) goal below 70 mg/dL.  I also advised the patient to eat a healthy diet with plenty of whole grains, cereals, fruits and vegetables, exercise regularly with at least 30 minutes of continuous activity daily and maintain ideal body weight. 2. Atrial fibrillation: Continuation of Eliquis and ongoing follow-up with cardiology 3. HTN: Stable.  Continue to follow with PCP for monitoring management 4. HLD: Continuation of atorvastatin and ongoing prescribing, monitoring and management by PCP 5. Pre-DMII: Continue to monitor by PCP    Follow up in 3 months or call earlier if needed   I spent 50 minutes of face-to-face and non-face-to-face time with patient, DIL and interpreter.  This included previsit chart review, lab review, study review, order entry, electronic health record documentation, patient education regarding recent stroke, residual deficits, importance of managing stroke risk factors and answered all questions to patient satisfaction     Frann Rider, [facility]  [facility] [address] [place], [place] [address]  Phone [phone] Fax  [phone] Note: This document was prepared with digital dictation and possible smart phrase technology. Any transcriptional errors that result from this process are unintentional.

## 2019-09-06 ENCOUNTER — Other Ambulatory Visit: Payer: Self-pay

## 2019-09-06 ENCOUNTER — Ambulatory Visit (INDEPENDENT_AMBULATORY_CARE_PROVIDER_SITE_OTHER): Payer: Medicare Other | Admitting: Adult Health

## 2019-09-06 ENCOUNTER — Encounter: Payer: Self-pay | Admitting: Adult Health

## 2019-09-06 VITALS — BP 130/77 | HR 55 | Temp 97.7°F | Ht 62.0 in | Wt 152.6 lb

## 2019-09-06 DIAGNOSIS — I4891 Unspecified atrial fibrillation: Secondary | ICD-10-CM

## 2019-09-06 DIAGNOSIS — I63412 Cerebral infarction due to embolism of left middle cerebral artery: Secondary | ICD-10-CM

## 2019-09-06 DIAGNOSIS — I69320 Aphasia following cerebral infarction: Secondary | ICD-10-CM | POA: Diagnosis not present

## 2019-09-06 DIAGNOSIS — R482 Apraxia: Secondary | ICD-10-CM

## 2019-09-06 DIAGNOSIS — I10 Essential (primary) hypertension: Secondary | ICD-10-CM

## 2019-09-06 DIAGNOSIS — E785 Hyperlipidemia, unspecified: Secondary | ICD-10-CM

## 2019-09-06 NOTE — Therapy (Signed)
[facility] [address] [place], [place], [address] Phone: [phone]   Fax:  [phone]  Physical Therapy Treatment  Patient Details  Name: Jamie Mitchell MRN: [id number] Date of Birth: Oct 03, 1945 Referring Provider (PT): Alysia Penna, MD   Encounter Date: 09/05/2019  PT End of Session - 09/05/19 1016    Visit Number  3    Number of Visits  17    Date for PT Re-Evaluation  10/13/19    Authorization Type  Medicare-10th visit progress note    PT Start Time  1016    PT Stop Time  1056    PT Time Calculation (min)  40 min    Activity Tolerance  Patient tolerated treatment well    Behavior During Therapy  [facility] for tasks assessed/performed       Past Medical History:  Diagnosis Date  . Hypertension   . Thyroid disease     Past Surgical History:  Procedure Laterality Date  . IR CT HEAD LTD  07/20/2019  . IR PERCUTANEOUS ART THROMBECTOMY/INFUSION INTRACRANIAL INC DIAG ANGIO  07/20/2019  . RADIOLOGY WITH ANESTHESIA N/A 07/20/2019   Procedure: IR WITH ANESTHESIA;  Surgeon: Luanne Bras, MD;  Location: [facility];  Service: Radiology;  Laterality: N/A;    There were no vitals filed for this visit.  Subjective Assessment - 09/05/19 1016    Subjective  Some throat pain. No new falls. HEP is going well at home per daughter in law and pt.    Patient is accompained by:  Family member;Interpreter    Limitations  Walking;House hold activities    Patient Stated Goals  Per interpretor:  improve speech and walking    Currently in Pain?  Yes    Pain Score  3     Pain Location  Neck    Pain Orientation  Right    Pain Descriptors / Indicators  Aching;Sore    Pain Type  Acute pain    Pain Onset  1 to 4 weeks ago    Pain Frequency  Intermittent    Aggravating Factors   when she swallows something    Pain Relieving Factors  unknown            [facility] Adult PT Treatment/Exercise - 09/05/19 1025      Transfers   Transfers  Sit to Stand;Stand to Sit    Sit to Stand  6: Modified independent (Device/Increase time)    Stand to Sit  6: Modified independent (Device/Increase time)      Ambulation/Gait   Ambulation/Gait  Yes    Ambulation/Gait Assistance  5: Supervision;4: Min guard    Ambulation/Gait Assistance Details  gait around track for 5 laps- working on speed changes, sudden stops/starts and scanning all directions randomly with supervision to min guard assist. no balance issues noted.     Ambulation Distance (Feet)  575 Feet   x1, plus around gym with session   Assistive device  None    Gait Pattern  Step-through pattern;Decreased stride length;Decreased arm swing - right;Decreased arm swing - left;Narrow base of support;Trunk flexed    Ambulation Surface  Level;Indoor      High Level Balance   High Level Balance Activities  Side stepping;Marching forwards;Marching backwards;Tandem walking   tandem gait fwd/bwd   High Level Balance Comments  on red/blue mats next to counter top: 3 laps each with cues on correct form, technique and weight shifting. min guard assist for balance with occasional touch to counter  for balance.           Balance Exercises - 09/05/19 1038      Balance Exercises: Standing   Rockerboard  Anterior/posterior;Lateral;Head turns;EO;EC;30 seconds;10 reps;Limitations    Rockerboard Limitations  both ways on balance board: rocking the board with emphasis on tall posture with EO; then holding the board steady for EC no head movements, progressing to EC head movements left<>right, then up<>down. up to min assist for balance with cues on posture/weight shifting to assist with balance. occasional touch to walls/chair to assist with balance as well.     Other Standing Exercises Comments  seated with feet parallel to each other on airex for sit<>stands for 10 reps with min guard assist, no UE support, progressing to staggered stance with left foot back/right foot forward  on airex for sit<>stands for 10 reps with up to min assist needed for balance and controlled descent. cues fo weight shifting and use of arms to control descent with fatigued.            PT Short Term Goals - 08/14/19 1433      PT SHORT TERM GOAL #1   Title  Pt will be independent with initial HEP in order to indiciate improved functional mobility and decreased fall risk.  (Target Date: 09/13/19)    Time  4    Period  Weeks    Status  New    Target Date  09/13/19      PT SHORT TERM GOAL #2   Title  Pt will improve FGA to >/=19/30 in order to indicate decreased fall risk.    Time  4    Period  Weeks    Status  New      PT SHORT TERM GOAL #3   Title  Pt will improve 5TSS to </=13 secs without UE support in order to indicate improved functional strength.    Time  4    Period  Weeks    Status  New      PT SHORT TERM GOAL #4   Title  Pt will improve gait speed to >/=3.71 ft/sec with no overt LOB/sway in order to indicate more normal gait speed.    Time  4    Period  Weeks    Status  New      PT SHORT TERM GOAL #5   Title  Pt will ambulate up/down stairs with single rail in alternating pattern at mod I level in order to indicate safe home entry/exit.    Time  4    Period  Weeks    Status  New        PT Long Term Goals - 08/14/19 1437      PT LONG TERM GOAL #1   Title  Pt will be independent with final HEP (with cues from family as needed) in order to indicate improved functional mobility and decreased fall risk.  (Target Date: 10/13/19)    Time  8    Period  Weeks    Status  New    Target Date  10/13/19      PT LONG TERM GOAL #2   Title  Pt will improve FGA to >/=23/30 in order to indicate decreased fall risk.    Time  8    Period  Weeks    Status  New      PT LONG TERM GOAL #3   Title  Pt will ambulate >1000' over varying outdoor surfaces at mod I (S  from a cognitive standpoint) level while scanning environment in order to indicate safe community mobility.    Time   8    Period  Weeks    Status  New      PT LONG TERM GOAL #4   Title  Pt will perform simulated gardening tasks at mod I level in order to return to leisure activities.    Time  8    Period  Weeks    Status  New            Plan - 09/05/19 1016    Clinical Impression Statement  Today's skilled session continued to focus on dynamic gait and balance reaction training on compliant surfaces with rest breaks taken due to fatigue. No other issues noted or reported in session. The pt is progressing toward goals and should benefit from continued PT to progress toward unmet goals.    Personal Factors and Comorbidities  Comorbidity 3+;Age    Comorbidities  see above    Examination-Activity Limitations  Carry;Dressing;Stand;Lift;Stairs;Squat;Physicist, medical Activity;Laundry;Shop;Yard Work    [employer]  Evolving/Moderate complexity    Rehab Potential  Excellent    PT Frequency  2x / week    PT Duration  8 weeks    PT Treatment/Interventions  ADLs/Self Care Home Management;Aquatic Therapy;Gait training;Stair training;Functional mobility training;Therapeutic activities;Therapeutic exercise;Balance training;Neuromuscular re-education;Patient/family education;Passive range of motion;Vestibular    PT Next Visit Plan  Continue to add to HEP as needed (update as needed) for high level balance and R LE strength (esp hip), outdoor gait, wider BOS with gait, turns, compliant surfaces.    Consulted and Agree with Plan of Care  Patient;Family member/caregiver    Family Member Consulted  daughter in law via interpreter       Patient will benefit from skilled therapeutic intervention in order to improve the following deficits and impairments:  Abnormal gait, Decreased activity tolerance, Decreased balance, Decreased cognition, Decreased endurance, Decreased mobility, Decreased strength, Impaired perceived functional  ability, Improper body mechanics, Postural dysfunction  Visit Diagnosis: Muscle weakness (generalized)  Other abnormalities of gait and mobility  Unsteadiness on feet     Problem List Patient Active Problem List   Diagnosis Date Noted  . Renal failure 07/25/2019  . Prediabetes 07/25/2019  . 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 Dysphagia 07/25/2019  . 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 Acute respiratory failure ([place])   . Tachycardia-bradycardia syndrome ([place])   . Atrial fibrillation with RVR ([place])   . Hypertension 07/23/2015  . Hyperlipidemia 07/23/2015  . 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 Hypothyroidism 01/25/2014   Willow Ora, PTA, [facility] [address], [place], [place] [address] [phone] 09/06/19, 10:45 AM   Name: Jamie Mitchell MRN: [id number] Date of Birth: 06-02-1945

## 2019-09-06 NOTE — Patient Instructions (Addendum)
Continue to work with out patient speech therapy for ongoing speech and swallowing difficulties   Continue Eliquis (apixaban) daily  and lipitor  for secondary stroke prevention  Discontinue aspirin 81 mg daily at this time as no longer indicated   Continue to follow up with PCP regarding cholesterol and blood pressure management   Continue to monitor blood pressure at home  Maintain strict control of hypertension with blood pressure goal below 130/90, diabetes with hemoglobin A1c goal below 6.5% and cholesterol with LDL cholesterol (bad cholesterol) goal below 70 mg/dL. I also advised the patient to eat a healthy diet with plenty of whole grains, cereals, fruits and vegetables, exercise regularly and maintain ideal body weight.  Followup in the future with me in 3 months or call earlier if needed       Thank you for coming to see [place] at [facility]. I hope we have been able to provide you high quality care today.  You may receive a patient satisfaction survey over the next few weeks. We would appreciate your feedback and comments so that we may continue to improve ourselves and the health of our patients.

## 2019-09-06 NOTE — Progress Notes (Signed)
I agree with the above plan 

## 2019-09-07 ENCOUNTER — Ambulatory Visit: Payer: Medicare Other | Admitting: Occupational Therapy

## 2019-09-07 ENCOUNTER — Encounter: Payer: Self-pay | Admitting: Physical Therapy

## 2019-09-07 ENCOUNTER — Ambulatory Visit: Payer: Medicare Other | Admitting: Physical Therapy

## 2019-09-07 DIAGNOSIS — R2689 Other abnormalities of gait and mobility: Secondary | ICD-10-CM

## 2019-09-07 DIAGNOSIS — R2681 Unsteadiness on feet: Secondary | ICD-10-CM | POA: Diagnosis not present

## 2019-09-07 DIAGNOSIS — M6281 Muscle weakness (generalized): Secondary | ICD-10-CM

## 2019-09-07 DIAGNOSIS — R482 Apraxia: Secondary | ICD-10-CM | POA: Diagnosis not present

## 2019-09-07 DIAGNOSIS — I69318 Other symptoms and signs involving cognitive functions following cerebral infarction: Secondary | ICD-10-CM | POA: Diagnosis not present

## 2019-09-07 DIAGNOSIS — I69351 Hemiplegia and hemiparesis following cerebral infarction affecting right dominant side: Secondary | ICD-10-CM | POA: Diagnosis not present

## 2019-09-07 NOTE — Therapy (Signed)
[facility] [address] [place], [place], [address] Phone: [phone]   Fax:  [phone]  Occupational Therapy Treatment  Patient Details  Name: Jamie Mitchell MRN: [id number] Date of Birth: 04-19-1946 Referring Provider (OT): Dr. Letta Pate   Encounter Date: 09/07/2019  OT End of Session - 09/07/19 1404    Visit Number  5    Number of Visits  9    Date for OT Re-Evaluation  09/15/19   date extended due to missed week   Authorization Type  medicare - will need PN every 10th visit    Authorization Time Period  week 3/4,      90 days - 11/06/2019    Authorization - Visit Number  5    Authorization - Number of Visits  10    Progress Note Due on Visit  10    OT Start Time  1404    OT Stop Time  1444    OT Time Calculation (min)  40 min    Activity Tolerance  Patient tolerated treatment well    Behavior During Therapy  [facility] for tasks assessed/performed       Past Medical History:  Diagnosis Date  . Hypertension   . Thyroid disease     Past Surgical History:  Procedure Laterality Date  . IR CT HEAD LTD  07/20/2019  . IR PERCUTANEOUS ART THROMBECTOMY/INFUSION INTRACRANIAL INC DIAG ANGIO  07/20/2019  . RADIOLOGY WITH ANESTHESIA N/A 07/20/2019   Procedure: IR WITH ANESTHESIA;  Surgeon: Luanne Bras, MD;  Location: [facility];  Service: Radiology;  Laterality: N/A;    There were no vitals filed for this visit.  Subjective Assessment - 09/07/19 1405    Subjective   Pt reports she doing a little more at home    Currently in Pain?  Yes    Pain Score  4     Pain Location  Throat    Pain Descriptors / Indicators  Aching    Pain Onset  1 to 4 weeks ago    Pain Frequency  Intermittent    Aggravating Factors   unknown    Pain Relieving Factors  unknown              Treatment: Simple cooking task to locate items and fry an egg. Pt was instructed via interpreter to locate items to cook and egg. Pt  required min v.c to locate eggs and oil and v.c to gather items prior to turning stove burner on for safety. Pt fried an egg in oil. Pt required v.c for safety as she reached in and touched hot egg. Pt was not injured. Pt turned off the stove without prompting but she required v.c to move the pan offf the hot burner. Pt was able to wash dishes independently. Therapist reviewed the activity with pt/ dtr via interpreter. Pt was instructed to cook something simple at home with her dtr's assistance, however her dtr should allow her to gather the tiems and plan what she needed without assistance if possible.                  OT Long Term Goals - 09/07/19 1437      OT LONG TERM GOAL #1   Title  Pt and family will be mod I with HEP for grip strength and home activities program for balance, increased independence and activity tolerance - 09/11/2019    Status  On-going      OT LONG TERM GOAL #  2   Title  Pt will demonstrate improved grip strength in R hand by at least 4 pounds to assist with home mgmt tasks    Baseline  25 lbs on eval    Status  Achieved   32.6 lbs     OT LONG TERM GOAL #3   Title  Pt will require no more than supervision to cook simple familiar hot meal    Status  On-going   min A in clinic     OT LONG TERM GOAL #4   Title  Pt will demonstrate ability to complete at least 40 minutes of functional familiar activity with no more than one rest break    Status  Achieved   met per pt's dtr           Plan - 09/07/19 1637    Clinical Impression Statement  Pt is progressing towards goals however she reuired min A/ v.c to perfrom simple cooking task today and to locate items.    OT Occupational Profile and History  Detailed Assessment- Review of Records and additional review of physical, cognitive, psychosocial history related to current functional performance    Occupational performance deficits (Please refer to evaluation for details):  ADL's;IADL's;Leisure;Social  Participation;Rest and Sleep    Body Structure / Function / Physical Skills  ADL;Balance;Endurance;IADL;Mobility;Strength;UE functional use    Rehab Potential  Good    Clinical Decision Making  Several treatment options, min-mod task modification necessary    Comorbidities Affecting Occupational Performance:  May have comorbidities impacting occupational performance    Modification or Assistance to Complete Evaluation   Min-Moderate modification of tasks or assist with assess necessary to complete eval    OT Frequency  2x / week    OT Duration  4 weeks    OT Treatment/Interventions  Self-care/ADL training;Aquatic Therapy;Therapeutic exercise;Neuromuscular education;Energy conservation;Functional Mobility Training;Patient/family education;Cognitive remediation/compensation;Therapeutic activities;Balance training    Plan  check on how pt did with cooking at home. Discuss plans to renew v.c d/c after next week    Consulted and Agree with Plan of Care  Patient;Family member/caregiver    Family Member Consulted  dtr in Copy       Patient will benefit from skilled therapeutic intervention in order to improve the following deficits and impairments:   Body Structure / Function / Physical Skills: ADL, Balance, Endurance, IADL, Mobility, Strength, UE functional use       Visit Diagnosis: Muscle weakness (generalized)  Hemiplegia and hemiparesis following cerebral infarction affecting right dominant side (HCC)  Other symptoms and signs involving cognitive functions following cerebral infarction    Problem List Patient Active Problem List   Diagnosis Date Noted  . Renal failure 07/25/2019  . Prediabetes 07/25/2019  . Left middle cerebral artery stroke ([place]) 07/25/2019  . 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 Dysphagia 07/25/2019  . 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  Acute respiratory failure ([place])   . Tachycardia-bradycardia syndrome ([place])   . Atrial fibrillation with RVR ([place])   . Hypertension 07/23/2015  . Hyperlipidemia 07/23/2015  . 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 Hypothyroidism 01/25/2014    Jamie Mitchell 09/07/2019, 4:39 PM  [facility] [address] [place], [place], [address] Phone: [phone]   Fax:  [phone]  Name: Jamie Mitchell MRN: [id number] Date of Birth: [birth year]

## 2019-09-07 NOTE — Therapy (Signed)
[facility] [address] [place], [place], [address] Phone: [phone]   Fax:  [phone]  Physical Therapy Treatment  Patient Details  Name: Jamie Mitchell MRN: [id number] Date of Birth: [birth year] Referring Provider (PT): Alysia Penna, MD   Encounter Date: 09/07/2019  PT End of Session - 09/07/19 1323    Visit Number  4    Number of Visits  17    Date for PT Re-Evaluation  10/13/19    Authorization Type  Medicare-10th visit progress note    PT Start Time  1319    PT Stop Time  1400    PT Time Calculation (min)  41 min    Equipment Utilized During Treatment  Gait belt    Activity Tolerance  Patient tolerated treatment well    Behavior During Therapy  [facility] for tasks assessed/performed       Past Medical History:  Diagnosis Date  . Hypertension   . Thyroid disease     Past Surgical History:  Procedure Laterality Date  . IR CT HEAD LTD  07/20/2019  . IR PERCUTANEOUS ART THROMBECTOMY/INFUSION INTRACRANIAL INC DIAG ANGIO  07/20/2019  . RADIOLOGY WITH ANESTHESIA N/A 07/20/2019   Procedure: IR WITH ANESTHESIA;  Surgeon: Luanne Bras, MD;  Location: [facility];  Service: Radiology;  Laterality: N/A;    There were no vitals filed for this visit.  Subjective Assessment - 09/07/19 1321    Subjective  No new complaints. No falls. Still with some throat pain, "bothersome".    Patient is accompained by:  Family member;Interpreter   daughter in law   Limitations  Walking;House hold activities    Patient Stated Goals  Per interpretor:  improve speech and walking    Currently in Pain?  Yes    Pain Score  5     Pain Location  Throat    Pain Descriptors / Indicators  Sore    Pain Type  Acute pain    Pain Onset  1 to 4 weeks ago    Pain Frequency  Intermittent    Aggravating Factors   with swallowing only    Pain Relieving Factors  unknown            [facility] Adult PT Treatment/Exercise - 09/07/19  1324      Transfers   Transfers  Sit to Stand;Stand to Sit    Sit to Stand  6: Modified independent (Device/Increase time)    Stand to Sit  6: Modified independent (Device/Increase time)      Ambulation/Gait   Ambulation/Gait  Yes    Ambulation/Gait Assistance  5: Supervision;4: Min guard    Ambulation/Gait Assistance Details  gait indoor/outdoors with no AD, pt with fast gait speed initially, slowed as fatigued. Improved with rest break. Pt then needed distance supervision to ambulate around gym with activity.      Ambulation Distance (Feet)  800 Feet   x1, plus around gym with session.   Assistive device  None    Gait Pattern  Step-through pattern;Decreased stride length;Decreased arm swing - right;Decreased arm swing - left;Narrow base of support;Trunk flexed    Ambulation Surface  Level;Unlevel;Indoor;Outdoor;Paved;Grass          Balance Exercises - 09/07/19 1332      Balance Exercises: Standing   Standing Eyes Closed  Wide (BOA);Foam/compliant surface;Other reps (comment);30 secs;Limitations;Head turns    Standing Eyes Closed Limitations  on airex with feet hip width apart, no UE support- EC no head movements for  30 sec's for 3 reps, progressing toward EC head movements left<>right, up<>down for 10 reps each. min guard to min assist for balance.                        SLS with Vectors  Foam/compliant surface;Intermittent upper extremity assist;Other reps (comment);Limitations    SLS with Vectors Limitations  on airex with 2 tall cones in front- alternating fwd foot taps, then alternating cross taps, ending with alternating fwd double taps for 10 reps each. cues needed on base of support, weight shifting for balance.                     Balance Beam  standing across blue foam beam with light UE support: alternating fwd stepping to floor/back onto beam, then alternating bwd stepping to floor/back onto beam for 10 reps each bil LE's with cues for step length/step height to clear beam  surface.  min guard assist for balance.     Tandem Gait  Forward;Retro;Intermittent upper extremity support;Foam/compliant surface;3 reps;Limitations    Tandem Gait Limitations  on blue foam beam: 3 laps each way with cues on form and technique.    Sidestepping  Foam/compliant support;Upper extremity support;3 reps;Limitations    Sidestepping Limitations  on blue foam beam: 3 laps each ways with no UE support to intermittent touch to bars for balance, cues to lift feet/not slide feet along. pt performed 3 laps each direction with min guard to min assist for balance/ex form.           PT Short Term Goals - 08/14/19 1433      PT SHORT TERM GOAL #1   Title  Pt will be independent with initial HEP in order to indiciate improved functional mobility and decreased fall risk.  (Target Date: 09/13/19)    Time  4    Period  Weeks    Status  New    Target Date  09/13/19      PT SHORT TERM GOAL #2   Title  Pt will improve FGA to >/=19/30 in order to indicate decreased fall risk.    Time  4    Period  Weeks    Status  New      PT SHORT TERM GOAL #3   Title  Pt will improve 5TSS to </=13 secs without UE support in order to indicate improved functional strength.    Time  4    Period  Weeks    Status  New      PT SHORT TERM GOAL #4   Title  Pt will improve gait speed to >/=3.71 ft/sec with no overt LOB/sway in order to indicate more normal gait speed.    Time  4    Period  Weeks    Status  New      PT SHORT TERM GOAL #5   Title  Pt will ambulate up/down stairs with single rail in alternating pattern at mod I level in order to indicate safe home entry/exit.    Time  4    Period  Weeks    Status  New        PT Long Term Goals - 08/14/19 1437      PT LONG TERM GOAL #1   Title  Pt will be independent with final HEP (with cues from family as needed) in order to indicate improved functional mobility and decreased fall risk.  (Target Date: 10/13/19)    Time  8    Period  Weeks    Status   New    Target Date  10/13/19      PT LONG TERM GOAL #2   Title  Pt will improve FGA to >/=23/30 in order to indicate decreased fall risk.    Time  8    Period  Weeks    Status  New      PT LONG TERM GOAL #3   Title  Pt will ambulate >1000' over varying outdoor surfaces at mod I (S from a cognitive standpoint) level while scanning environment in order to indicate safe community mobility.    Time  8    Period  Weeks    Status  New      PT LONG TERM GOAL #4   Title  Pt will perform simulated gardening tasks at mod I level in order to return to leisure activities.    Time  8    Period  Weeks    Status  New          Plan - 09/07/19 1323    Clinical Impression Statement  Today's skilled session continued to address gait on various surfaces and balance reacions. Rest breaks taken as needed due to fatigue. The pt is progressing toward goals and should benefit from continued PT to progress toward unmet goals.    Personal Factors and Comorbidities  Comorbidity 3+;Age    Comorbidities  see above    Examination-Activity Limitations  Carry;Dressing;Stand;Lift;Stairs;Squat;Physicist, medical Activity;Laundry;Shop;Yard Work    Merchant navy officer  Evolving/Moderate complexity    Rehab Potential  Excellent    PT Frequency  2x / week    PT Duration  8 weeks    PT Treatment/Interventions  ADLs/Self Care Home Management;Aquatic Therapy;Gait training;Stair training;Functional mobility training;Therapeutic activities;Therapeutic exercise;Balance training;Neuromuscular re-education;Patient/family education;Passive range of motion;Vestibular    PT Next Visit Plan  Continue to add to HEP as needed (update as needed) for high level balance and R LE strength (esp hip), outdoor gait, wider BOS with gait, turns, compliant surfaces.    Consulted and Agree with Plan of Care  Patient;Family member/caregiver    Family Member  Consulted  daughter in law via interpreter       Patient will benefit from skilled therapeutic intervention in order to improve the following deficits and impairments:  Abnormal gait, Decreased activity tolerance, Decreased balance, Decreased cognition, Decreased endurance, Decreased mobility, Decreased strength, Impaired perceived functional ability, Improper body mechanics, Postural dysfunction  Visit Diagnosis: Muscle weakness (generalized)  Other abnormalities of gait and mobility  Unsteadiness on feet     Problem List Patient Active Problem List   Diagnosis Date Noted  . Renal failure 07/25/2019  . Prediabetes 07/25/2019  . 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 Dysphagia 07/25/2019  . 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 Acute respiratory failure ([place])   . Tachycardia-bradycardia syndrome ([place])   . Atrial fibrillation with RVR ([place])   . Hypertension 07/23/2015  . Hyperlipidemia 07/23/2015  . 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 Hypothyroidism 01/25/2014    Willow Ora, PTA, [facility] [address] [place], [place] [address] [phone] 09/07/19, 11:33 PM   Name: Sobia Norling MRN: [id number] Date of Birth: 04/02/1946

## 2019-09-08 ENCOUNTER — Other Ambulatory Visit: Payer: Self-pay | Admitting: *Deleted

## 2019-09-08 ENCOUNTER — Telehealth: Payer: Self-pay

## 2019-09-08 ENCOUNTER — Other Ambulatory Visit: Payer: Self-pay

## 2019-09-08 MED ORDER — LEVOTHYROXINE SODIUM 88 MCG PO TABS: 88.0000 ug | ORAL_TABLET | Freq: Every day | ORAL | 3 refills | Status: DC

## 2019-09-08 NOTE — Patient Outreach (Signed)
[facility]) Care Management  09/08/2019  Jamie Mitchell 1946/05/16 [id number]   Subjective: Telephone call to patient's designated party release/ son  Jamie Mitchell), no answer, message states voicemail not set up, and unable to leave message.      Objective:Per KPN (Knowledge Performance Now, point of care tool) and chart review,patient hospitalized 07/25/2019 - 08/03/2019 forLeft middle cerebral artery strokerehab. Patient hospitalized 07/19/2019 - [id number] for Embolic stroke involving left middle cerebral artery, status post tPA,mechanical thrombectomy, due toAtrial fibrillation. Patient also has a history of Hypothyroidism,Hypertension,Prediabetes,Aphasia as late effect of cerebrovascular accident, and Dysphagia.    Assessment: Received [facility] Liaison referral on 08/03/2019. Referral source: Natividad Brood. Referral reason: This was a hospital referral from the nutritionist/patient on [employer] family needed teaching and support -Has home health; [facility] is TOC.  Please assign to [place] Coordinator for complex care and disease management follow up calls, patient is aphasic and speaks/understands Spanish, her son is the contact person and speaks fluent [place], his name is Jamie Mitchell, (Son) [phone] and assess for further needs. Patient was in the rehab center.Screening follow up completed,hasreferredto Education officer, museum for [employer] follow up, telephone assessment pending,andRNCM will continue to follow for care management needs.     Plan: RNCM has  sent unsuccessful outreach  letter, [facility] pamphlet, will call patient's son/ designated party releasefor 4th telephone outreach attempt, within21 business days,telephone assessment, if no return call within 10 business days, will proceed with case closure, after 4th unsuccessful outreach call.     Bently Wyss H. Annia Friendly, BSN,  [facility] Telephonic CM Phone: [phone] Fax: [phone]

## 2019-09-08 NOTE — Telephone Encounter (Signed)
-----   Message from Charlott Rakes, MD sent at 09/05/2019  9:12 AM EDT ----- Thyroid is abnormal, I have sent a new prescription for 88 mcg of levothyroxine to her pharmacy.

## 2019-09-08 NOTE — Telephone Encounter (Signed)
Patient name and DOB has been verified Patient was informed of lab results. Patient had no questions.  

## 2019-09-12 ENCOUNTER — Ambulatory Visit: Payer: Medicare Other | Admitting: Physical Therapy

## 2019-09-12 ENCOUNTER — Ambulatory Visit: Payer: Medicare Other | Admitting: Occupational Therapy

## 2019-09-12 ENCOUNTER — Other Ambulatory Visit: Payer: Self-pay

## 2019-09-12 ENCOUNTER — Ambulatory Visit: Payer: Medicare Other | Admitting: Speech Pathology

## 2019-09-12 ENCOUNTER — Encounter: Payer: Self-pay | Admitting: Physical Therapy

## 2019-09-12 DIAGNOSIS — I69318 Other symptoms and signs involving cognitive functions following cerebral infarction: Secondary | ICD-10-CM | POA: Diagnosis not present

## 2019-09-12 DIAGNOSIS — R2681 Unsteadiness on feet: Secondary | ICD-10-CM

## 2019-09-12 DIAGNOSIS — R2689 Other abnormalities of gait and mobility: Secondary | ICD-10-CM

## 2019-09-12 DIAGNOSIS — R4701 Aphasia: Secondary | ICD-10-CM

## 2019-09-12 DIAGNOSIS — R482 Apraxia: Secondary | ICD-10-CM

## 2019-09-12 DIAGNOSIS — I69351 Hemiplegia and hemiparesis following cerebral infarction affecting right dominant side: Secondary | ICD-10-CM

## 2019-09-12 DIAGNOSIS — M6281 Muscle weakness (generalized): Secondary | ICD-10-CM

## 2019-09-12 DIAGNOSIS — R41841 Cognitive communication deficit: Secondary | ICD-10-CM

## 2019-09-12 NOTE — Therapy (Signed)
[facility] [address] [place], [place], [address] Phone: [phone]   Fax:  [phone]  Physical Therapy Treatment  Patient Details  Name: Jamie Mitchell MRN: [id number] Date of Birth: 1945-12-02 Referring Provider (PT): Alysia Penna, MD   Encounter Date: 09/12/2019  PT End of Session - 09/12/19 1102    Visit Number  5    Number of Visits  17    Date for PT Re-Evaluation  10/13/19    Authorization Type  Medicare-10th visit progress note    PT Start Time  1102    PT Stop Time  1142    PT Time Calculation (min)  40 min    Equipment Utilized During Treatment  Gait belt    Activity Tolerance  Patient tolerated treatment well    Behavior During Therapy  [facility] for tasks assessed/performed       Past Medical History:  Diagnosis Date  . Hypertension   . Thyroid disease     Past Surgical History:  Procedure Laterality Date  . IR CT HEAD LTD  07/20/2019  . IR PERCUTANEOUS ART THROMBECTOMY/INFUSION INTRACRANIAL INC DIAG ANGIO  07/20/2019  . RADIOLOGY WITH ANESTHESIA N/A 07/20/2019   Procedure: IR WITH ANESTHESIA;  Surgeon: Luanne Bras, MD;  Location: [facility];  Service: Radiology;  Laterality: N/A;    There were no vitals filed for this visit.  Subjective Assessment - 09/12/19 1101    Subjective  No new complaints. No falls or pain to report.    Patient is accompained by:  Family member;Interpreter   daughter in Sports coach, in person interpreter   Limitations  Walking;House hold activities    Patient Stated Goals  Per interpretor:  improve speech and walking    Currently in Pain?  No/denies    Pain Score  0-No pain         [facility] PT Assessment - 09/12/19 1103      Transfers   Transfers  Sit to Stand;Stand to Sit    Sit to Stand  6: Modified independent (Device/Increase time)    Five time sit to stand comments   11.03 without UE support    Stand to Sit  6: Modified independent (Device/Increase  time)      Ambulation/Gait   Ambulation/Gait  Yes    Ambulation/Gait Assistance  5: Supervision    Assistive device  None    Gait Pattern  Step-through pattern;Decreased stride length;Decreased arm swing - right;Decreased arm swing - left;Narrow base of support;Trunk flexed    Ambulation Surface  Level;Indoor    Gait velocity  7.87 sec's= 4.17 ft/sec no AD    Stairs  Yes    Stairs Assistance  6: Modified independent (Device/Increase time)    Stair Management Technique  One rail Right;Alternating pattern;Forwards    Number of Stairs  4    Height of Stairs  6      Functional Gait  Assessment   Gait assessed   Yes    Gait Level Surface  Walks 20 ft in less than 7 sec but greater than 5.5 sec, uses assistive device, slower speed, mild gait deviations, or deviates 6-10 in outside of the 12 in walkway width.   6.0 sec's   Change in Gait Speed  Able to smoothly change walking speed without loss of balance or gait deviation. Deviate no more than 6 in outside of the 12 in walkway width.    Gait with Horizontal Head Turns  Performs head turns smoothly with  slight change in gait velocity (eg, minor disruption to smooth gait path), deviates 6-10 in outside 12 in walkway width, or uses an assistive device.    Gait with Vertical Head Turns  Performs head turns with no change in gait. Deviates no more than 6 in outside 12 in walkway width.    Gait and Pivot Turn  Pivot turns safely within 3 sec and stops quickly with no loss of balance.    Step Over Obstacle  Is able to step over one shoe box (4.5 in total height) without changing gait speed. No evidence of imbalance.    Gait with Narrow Base of Support  Is able to ambulate for 10 steps heel to toe with no staggering.    Gait with Eyes Closed  Walks 20 ft, slow speed, abnormal gait pattern, evidence for imbalance, deviates 10-15 in outside 12 in walkway width. Requires more than 9 sec to ambulate 20 ft.   > 9 seconds, no balance issues noted   Ambulating  Backwards  Walks 20 ft, uses assistive device, slower speed, mild gait deviations, deviates 6-10 in outside 12 in walkway width.    Steps  Alternating feet, must use rail.    Total Score  23    FGA comment:  19-24 = medium fall risk           [facility] Adult PT Treatment/Exercise - 09/12/19 1103      High Level Balance   High Level Balance Activities  Side stepping;Marching forwards;Marching backwards;Tandem walking    High Level Balance Comments  on red/blue mats next to counter top: 3 laps each with cues on correct form, technique and weight shifting. min guard assist for balance with occasional touch to counter for balance.           Balance Exercises - 09/12/19 1126      Balance Exercises: Standing   Standing Eyes Closed  Narrow base of support (BOS);Wide (BOA);Head turns;Foam/compliant surface;Other reps (comment);30 secs;Limitations    Standing Eyes Closed Limitations  on dense blue foam in corner with chair in front for safety: feet apart progressing to together for EC no head movements, then with feet back apart for EC head movements left<>right, then up<>down for 10 reps each. min guard assist for balance with cues on posture.     Balance Beam  standing across red foam beam, no UE support: alternating forward stepping to floor/back onto beam for 10 reps each side, min guard to min assist for balance. cues for step length/height to clear beam surface.      Partial Tandem Stance  Eyes closed;Foam/compliant surface;3 reps;30 secs;Limitations    Partial Tandem Stance Limitations  on dense blue foam in corner with chair in front for safety: staggered stance with EC for 30 seconds x 3 reps each foot forward. min guard to min assist for balance with cues on posture and weight shifting.     Other Standing Exercises Comments  seated with feet across red faom beam: sit<>stands for 10 reps with emphasis for tall posture and controlled descent.           PT Short Term Goals - 09/12/19  1153      PT SHORT TERM GOAL #1   Title  Pt will be independent with initial HEP in order to indiciate improved functional mobility and decreased fall risk.  (Target Date: 09/13/19)    Baseline  09/12/19; met with current program    Status  Achieved    Target Date  09/13/19      PT SHORT TERM GOAL #2   Title  Pt will improve FGA to >/=19/30 in order to indicate decreased fall risk.    Baseline  09/12/19: 23/30 scored today    Status  Achieved      PT SHORT TERM GOAL #3   Title  Pt will improve 5TSS to </=13 secs without UE support in order to indicate improved functional strength.    Baseline  09/12/19: 11.03 without UE support    Status  Achieved      PT SHORT TERM GOAL #4   Title  Pt will improve gait speed to >/=3.71 ft/sec with no overt LOB/sway in order to indicate more normal gait speed.    Baseline  09/12/19: 4.17 ft/sec no AD, no balance issues    Status  Achieved      PT SHORT TERM GOAL #5   Title  Pt will ambulate up/down stairs with single rail in alternating pattern at mod I level in order to indicate safe home entry/exit.    Baseline  09/12/19: met in session today    Status  Achieved        PT Long Term Goals - 09/12/19 1155      PT LONG TERM GOAL #1   Title  Pt will be independent with final HEP (with cues from family as needed) in order to indicate improved functional mobility and decreased fall risk.  (Target Date: 10/13/19)    Time  8    Period  Weeks    Status  On-going      PT LONG TERM GOAL #2   Title  Pt will improve FGA to >/=23/30 in order to indicate decreased fall risk.    Baseline  09/12/19: 23/30 scored today    Status  Achieved      PT LONG TERM GOAL #3   Title  Pt will ambulate >1000' over varying outdoor surfaces at mod I (S from a cognitive standpoint) level while scanning environment in order to indicate safe community mobility.    Time  8    Period  Weeks    Status  On-going      PT LONG TERM GOAL #4   Title  Pt will perform simulated  gardening tasks at mod I level in order to return to leisure activities.    Time  8    Period  Weeks    Status  On-going            Plan - 09/12/19 1103    Clinical Impression Statement  Today's skilled session initially focused on progress toward STGs with all goals met. Pt has also met her LTG for Funtional Gait Assessment with score of 23/30 today. Remainder of session focused on balance reactions on compliant surfaces with no issues noted or reported. The pt is progressing toward goals and should benefit from continued PT to progress toward unmet goals.    Personal Factors and Comorbidities  Comorbidity 3+;Age    Comorbidities  see above    Examination-Activity Limitations  Carry;Dressing;Stand;Lift;Stairs;Squat;Physicist, medical Activity;Laundry;Shop;Yard Work    Merchant navy officer  Evolving/Moderate complexity    Rehab Potential  Excellent    PT Frequency  2x / week    PT Duration  8 weeks    PT Treatment/Interventions  ADLs/Self Care Home Management;Aquatic Therapy;Gait training;Stair training;Functional mobility training;Therapeutic activities;Therapeutic exercise;Balance training;Neuromuscular re-education;Patient/family education;Passive range of motion;Vestibular    PT Next Visit  Plan  Continue to add to HEP as needed (update as needed) for high level balance and R LE strength (esp hip), outdoor gait, wider BOS with gait, turns, compliant surfaces.    Consulted and Agree with Plan of Care  Patient;Family member/caregiver    Family Member Consulted  daughter in law via interpreter       Patient will benefit from skilled therapeutic intervention in order to improve the following deficits and impairments:  Abnormal gait, Decreased activity tolerance, Decreased balance, Decreased cognition, Decreased endurance, Decreased mobility, Decreased strength, Impaired perceived functional ability, Improper  body mechanics, Postural dysfunction  Visit Diagnosis: Muscle weakness (generalized)  Other abnormalities of gait and mobility  Unsteadiness on feet     Problem List Patient Active Problem List   Diagnosis Date Noted  . Renal failure 07/25/2019  . Prediabetes 07/25/2019  . 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 Dysphagia 07/25/2019  . 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 Acute respiratory failure ([place])   . Tachycardia-bradycardia syndrome ([place])   . Atrial fibrillation with RVR ([place])   . Hypertension 07/23/2015  . Hyperlipidemia 07/23/2015  . 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 Hypothyroidism 01/25/2014    Willow Ora, PTA, [facility] [address] [place], [place] [address] [phone] 09/12/19, 11:58 AM   Name: Jamie Mitchell MRN: [id number] Date of Birth: Feb 29, 1946

## 2019-09-12 NOTE — Therapy (Signed)
[facility] [address] [place], [place], [address] Phone: [phone]   Fax:  [phone]  Speech Language Pathology Treatment  Patient Details   Name: Jamie Mitchell MRN: [id number] Date of Birth: Sep 28, 1945 Referring Provider (SLP): Alysia Penna, MD   Encounter Date: 09/12/2019  End of Session - 09/12/19 1144    Visit Number  7    Number of Visits  17    Date for SLP Re-Evaluation  11/17/19    Activity Tolerance  Patient tolerated treatment well       Past Medical History:  Diagnosis Date  . Hypertension   . Thyroid disease     Past Surgical History:  Procedure Laterality Date  . IR CT HEAD LTD  07/20/2019  . IR PERCUTANEOUS ART THROMBECTOMY/INFUSION INTRACRANIAL INC DIAG ANGIO  07/20/2019  . RADIOLOGY WITH ANESTHESIA N/A 07/20/2019   Procedure: IR WITH ANESTHESIA;  Surgeon: Luanne Bras, MD;  Location: [facility];  Service: Radiology;  Laterality: N/A;    There were no vitals filed for this visit.  Subjective Assessment - 09/12/19 1025    Subjective  Pt denies any throat pain today. ENT referral was made by PCP however pt does not feel she needs this now.    Currently in Pain?  No/denies            ADULT SLP TREATMENT - 09/12/19 1026      General Information   Behavior/Cognition  Alert;Cooperative;Pleasant mood;Distractible;Decreased sustained attention;Requires cueing;Doesn't follow directions      Treatment Provided   Treatment provided  Cognitive-Linquistic      Pain Assessment   Pain Assessment  No/denies pain      Cognitive-Linquistic Treatment   Treatment focused on  Aphasia    Skilled Treatment  Patient reports throat pain has improved since last week (via live interpreter, present for session). SLP targeted pt's verbal expression, attempting to increase MLU with picture description task. Pt's responses on average MLU less than or equal to 4, increased to 5-6 with usual  min-mod cues. Anomia/cognitive deficits made it difficult for pt to verbalize similarities/differences between objects. Patient was very interested in pictures of flowers/vegetables, so SLP facilitated simple conversation around this topic, as patient enjoys gardening. Pt able to answer simple questions re: what she has grown in her garden and made appropriate comments to SLP statements about gardening. Targeted auditory comprehension with simple one step commands 100% accuracy. For 2-step or commands with superlatives, comparatives, or L/R accuracy was 60%, improved to 80% with usual min-mod cues and repetition, with interpreter stressing the superlative/comparative. Pt also required cues to reduce impulsivity with this task.      Assessment / Recommendations / Plan   Plan  Continue with current plan of care      Progression Toward Goals   Progression toward goals  Progressing toward goals       SLP Education - 09/12/19 1143    Education Details  caregiver compensations to improve auditory comprehension    Person(s) Educated  Patient;Child(ren)    Methods  Explanation;Demonstration    Comprehension  Verbalized understanding       SLP Short Term Goals - 09/12/19 1144      SLP SHORT TERM GOAL #1   Title  pt will produce sentences describing pictures with MLU = 5.5 (at least 25 utterances) in 2 sessions    Time  1    Period  Weeks    Status  Not Met  SLP SHORT TERM GOAL #2   Title  pt will communicate basic wants and needs functionally (reported by pt and/or family) with family with min questioning cues over 3 sessions    Time  1    Period  Weeks    Status  Achieved      SLP SHORT TERM GOAL #3   Title  pt and/or family will indicate 3 overt s/sx aspiration PNA with modified independence    Time  1    Period  Weeks    Status  On-going      SLP SHORT TERM GOAL #4   Title  pt will follow commands with superlatives or comparatives 80% with occasional min-mod A in 2 sessions     Time  1    Period  Weeks    Status  Not Met       SLP Long Term Goals - 09/12/19 1145      SLP [place] #1   Title  pt will participate in simple conversation with MLU=7.0    Time  4    Period  Weeks   or 17 sessions, for all LTGs   Status  On-going      SLP LONG TERM GOAL #2   Title  pt will practice speech and language tasks at home at least 4 days/week at least 6 weeks    Time  4    Period  Weeks    Status  On-going      SLP LONG TERM GOAL #3   Title  pt will demo understanding of 10 minutes simple-mod complex conversation with modified indpendence    Time  4    Period  Weeks    Status  On-going      SLP LONG TERM GOAL #4   Title  pt will demonstrate attention appropriate for efficacious completion of therapy tasks for 40 minutes in 8 sessions    Time  4    Period  Weeks    Status  On-going       Plan - 09/12/19 1144    Clinical Impression Statement  Pt is limited [place] speaker (therefore therapy tasks were completed via live interpreter) who presents with receptive and expressive aphasia, 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 Pt remains on dys III/thin currently and daughter in law reports no difficulties with this at home. Pt ?able cognitive linquistic skills - testing to be completed as needed.. Pt would benefit from skilled ST targeting receptive and expressive language and verbal skills.    Speech Therapy Frequency  2x / week    Duration  --   8 weeks or 17 visits   Treatment/Interventions  Aspiration precaution training;Pharyngeal strengthening exercises;Diet toleration management by SLP;Trials of upgraded texture/liquids;Language facilitation;Cueing hierarchy;SLP instruction and feedback;Internal/external aids;Compensatory strategies;Patient/family education;Cognitive reorganization;Functional tasks;Environmental controls;Multimodal communcation approach    Potential to Achieve Goals  Fair    Potential Considerations  Severity of  impairments;Cooperation/participation level       Patient will benefit from skilled therapeutic intervention in order to improve the following deficits and impairments:   Aphasia  Verbal apraxia  Cognitive communication deficit    Problem List Patient Active Problem List   Diagnosis Date Noted  . Renal failure 07/25/2019  . Prediabetes 07/25/2019  . 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 Dysphagia 07/25/2019  . 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 Acute respiratory failure ([facility])   . Tachycardia-bradycardia syndrome ([place])   . Atrial fibrillation with RVR ([facility])   . Hypertension 07/23/2015  . Hyperlipidemia 07/23/2015  . 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 Hypothyroidism 01/25/2014   Deneise Lever, [place], [place] Speech-Language Pathologist  Aliene Altes 09/12/2019, 11:46 AM  [facility] [address] [place], [place], [address] Phone: [phone]   Fax:  [phone]   Name: Jamie Mitchell MRN: [id number] Date of Birth: Oct 18, 1945

## 2019-09-12 NOTE — Patient Instructions (Signed)
  Coordination Activities  Perform the following activities for 20 minutes 1 times per day with right hand(s).   Rotate ball in fingertips (clockwise and counter-clockwise).  Toss ball between hands.  Toss ball in air and catch with the same hand.  Flip cards 1 at a time as fast as you can.  Deal cards with your thumb (Hold deck in hand and push card off top with thumb).  Pick up coins, buttons, marbles, dried beans/pasta of different sizes and place in container.  Pick up coins and place in container or coin bank.  Pick up coins and stack.  Pick up coins one at a time until you get 5-10 in your hand, then move coins from palm to fingertips to place in container 1 at a time

## 2019-09-12 NOTE — Therapy (Signed)
[facility] [address] [place], [place], [address] Phone: [phone]   Fax:  [phone]  Occupational Therapy Treatment  Patient Details  Name: Jamie Mitchell MRN: [id number] Date of Birth: [birth year] Referring Provider (OT): Dr. Letta Pate   Encounter Date: 09/12/2019  OT End of Session - 09/12/19 0955    Visit Number  6    Number of Visits  9    Date for OT Re-Evaluation  09/15/19   date extended due to missed week   Authorization Type  medicare - will need PN every 10th visit    Authorization Time Period  week 3/4,      90 days - 11/06/2019    Authorization - Visit Number  6    Authorization - Number of Visits  10    Progress Note Due on Visit  10    OT Start Time  [phone]    OT Stop Time  1015    OT Time Calculation (min)  38 min    Activity Tolerance  Patient tolerated treatment well    Behavior During Therapy  [facility] for tasks assessed/performed       Past Medical History:  Diagnosis Date  . Hypertension   . Thyroid disease     Past Surgical History:  Procedure Laterality Date  . IR CT HEAD LTD  07/20/2019  . IR PERCUTANEOUS ART THROMBECTOMY/INFUSION INTRACRANIAL INC DIAG ANGIO  07/20/2019  . RADIOLOGY WITH ANESTHESIA N/A 07/20/2019   Procedure: IR WITH ANESTHESIA;  Surgeon: Luanne Bras, MD;  Location: [facility];  Service: Radiology;  Laterality: N/A;    There were no vitals filed for this visit.  Subjective Assessment - 09/12/19 0954    Subjective   Pt reports assisting with cooking    Currently in Pain?  No/denies    Pain Onset            Treatment: Pt. Reports assisting with cooking some meat at home . Therapist encouraged pt to cook an egg at home with supervision Gripper set at level 1 to pick up 1 inch blocks for sustained grip, mod v.c to slow down and attend to task. Placing grooved pegs into pegboard with RUE, min difficulty/ v.c for upright posture. Discussed plans for d/c  next visit              OT Education - 09/12/19 0955    Education Details  coordination exercises- see pt instructions , min v.c via interpreter, family to translate the instructions for patient    Person(s) Educated  Patient;Child(ren);Other (comment)   interpreter   Methods  Explanation;Demonstration;Verbal cues;Handout    Comprehension  Verbalized understanding;Returned demonstration;Verbal cues required          OT Long Term Goals - 09/12/19 0941      OT LONG TERM GOAL #1   Title  Pt and family will be mod I with HEP for grip strength and home activities program for balance, increased independence and activity tolerance - 09/11/2019    Status  On-going      OT LONG TERM GOAL #2   Title  Pt will demonstrate improved grip strength in R hand by at least 4 pounds to assist with home mgmt tasks    Baseline  25 lbs on eval    Status  Achieved   32.6 lbs     OT LONG TERM GOAL #3   Title  Pt will require no more than supervision to cook simple familiar hot meal  Status  On-going   min A in clinic, family reports pt performed with supervision     OT LONG TERM GOAL #4   Title  Pt will demonstrate ability to complete at least 40 minutes of functional familiar activity with no more than one rest break    Status  Achieved   met per pt's dtr           Plan - 09/12/19 0944    Clinical Impression Statement  --    OT Occupational Profile and History  Detailed Assessment- Review of Records and additional review of physical, cognitive, psychosocial history related to current functional performance    Occupational performance deficits (Please refer to evaluation for details):  ADL's;IADL's;Leisure;Social Participation;Rest and Sleep    Body Structure / Function / Physical Skills  ADL;Balance;Endurance;IADL;Mobility;Strength;UE functional use    Rehab Potential  Good    Clinical Decision Making  Several treatment options, min-mod task modification necessary     Comorbidities Affecting Occupational Performance:  May have comorbidities impacting occupational performance    Modification or Assistance to Complete Evaluation   Min-Moderate modification of tasks or assist with assess necessary to complete eval    OT Frequency  2x / week    OT Duration  4 weeks    OT Treatment/Interventions  Self-care/ADL training;Aquatic Therapy;Therapeutic exercise;Neuromuscular education;Energy conservation;Functional Mobility Training;Patient/family education;Cognitive remediation/compensation;Therapeutic activities;Balance training    Plan  check on how pt did with cooking at home. Discuss plans to renew v.c d/c after next week    Consulted and Agree with Plan of Care  Patient;Family member/caregiver    Family Member Consulted  dtr in Copy       Patient will benefit from skilled therapeutic intervention in order to improve the following deficits and impairments:   Body Structure / Function / Physical Skills: ADL, Balance, Endurance, IADL, Mobility, Strength, UE functional use       Visit Diagnosis: Muscle weakness (generalized)  Other abnormalities of gait and mobility  Hemiplegia and hemiparesis following cerebral infarction affecting right dominant side (HCC)  Other symptoms and signs involving cognitive functions following cerebral infarction    Problem List Patient Active Problem List   Diagnosis Date Noted  . Renal failure 07/25/2019  . Prediabetes 07/25/2019  . 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 Dysphagia 07/25/2019  . 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 Acute respiratory failure ([place])   . Tachycardia-bradycardia syndrome ([place])   . Atrial fibrillation with RVR ([facility])   . Hypertension 07/23/2015  . Hyperlipidemia 07/23/2015  . DJD (degenerative joint disease) of knee  01/25/2014  . Hypothyroidism 01/25/2014    Taraann Olthoff 09/12/2019, 10:02 AM  [facility] [address] [place], [place], [address] Phone: [phone]   Fax:  [phone]  Name: Jamie Mitchell MRN: [id number] Date of Birth: 1946/04/27

## 2019-09-13 DIAGNOSIS — Z23 Encounter for immunization: Secondary | ICD-10-CM | POA: Diagnosis not present

## 2019-09-14 ENCOUNTER — Other Ambulatory Visit: Payer: Self-pay | Admitting: Cardiology

## 2019-09-14 ENCOUNTER — Ambulatory Visit: Payer: Medicare Other

## 2019-09-14 ENCOUNTER — Encounter: Payer: Self-pay | Admitting: Physical Therapy

## 2019-09-14 ENCOUNTER — Other Ambulatory Visit: Payer: Self-pay

## 2019-09-14 ENCOUNTER — Ambulatory Visit: Payer: Medicare Other | Admitting: Physical Therapy

## 2019-09-14 ENCOUNTER — Ambulatory Visit: Payer: Medicare Other | Admitting: Occupational Therapy

## 2019-09-14 DIAGNOSIS — R482 Apraxia: Secondary | ICD-10-CM | POA: Diagnosis not present

## 2019-09-14 DIAGNOSIS — I69318 Other symptoms and signs involving cognitive functions following cerebral infarction: Secondary | ICD-10-CM | POA: Diagnosis not present

## 2019-09-14 DIAGNOSIS — M6281 Muscle weakness (generalized): Secondary | ICD-10-CM

## 2019-09-14 DIAGNOSIS — R4701 Aphasia: Secondary | ICD-10-CM

## 2019-09-14 DIAGNOSIS — R2689 Other abnormalities of gait and mobility: Secondary | ICD-10-CM

## 2019-09-14 DIAGNOSIS — I69351 Hemiplegia and hemiparesis following cerebral infarction affecting right dominant side: Secondary | ICD-10-CM | POA: Diagnosis not present

## 2019-09-14 DIAGNOSIS — R2681 Unsteadiness on feet: Secondary | ICD-10-CM | POA: Diagnosis not present

## 2019-09-14 DIAGNOSIS — R41841 Cognitive communication deficit: Secondary | ICD-10-CM

## 2019-09-14 MED ORDER — AMIODARONE HCL 200 MG PO TABS: 200.0000 mg | ORAL_TABLET | Freq: Every day | ORAL | 5 refills | Status: DC

## 2019-09-14 NOTE — Telephone Encounter (Signed)
*  STAT* If patient is at the pharmacy, call can be transferred to refill team.   1. Which medications need to be refilled? (please list name of each medication and dose if known)  amiodarone (PACERONE) 200 MG tablet  2. Which pharmacy/location (including street and city if local pharmacy) is medication to be sent to? [facility], [place]  3. Do they need a 30 day or 90 day supply? 30 day supply

## 2019-09-14 NOTE — Therapy (Signed)
[facility] [address] [place], [place], [address] Phone: [phone]   Fax:  [phone]  Speech Language Pathology Treatment  Patient Details  Name: Jamie Mitchell MRN: [id number] Date of Birth: 08/05/1945 Referring Provider (SLP): Alysia Penna, MD   Encounter Date: 09/14/2019  End of Session - 09/14/19 1701    Visit Number  8    Number of Visits  17    Date for SLP Re-Evaluation  11/17/19    SLP Start Time  1021    SLP Stop Time   1101    SLP Time Calculation (min)  40 min    Activity Tolerance  Patient tolerated treatment well       Past Medical History:  Diagnosis Date  . Hypertension   . Thyroid disease     Past Surgical History:  Procedure Laterality Date  . IR CT HEAD LTD  07/20/2019  . IR PERCUTANEOUS ART THROMBECTOMY/INFUSION INTRACRANIAL INC DIAG ANGIO  07/20/2019  . RADIOLOGY WITH ANESTHESIA N/A 07/20/2019   Procedure: IR WITH ANESTHESIA;  Surgeon: Luanne Bras, MD;  Location: [facility];  Service: Radiology;  Laterality: N/A;    There were no vitals filed for this visit.  Subjective Assessment - 09/14/19 1023    Subjective  Pt cont to report no throat pain.    Patient is accompained by:  Family member;Interpreter    Currently in Pain?  No/denies            ADULT SLP TREATMENT - 09/14/19 1024      General Information   Behavior/Cognition  Alert;Cooperative;Pleasant mood;Distractible;Decreased sustained attention;Requires cueing;Doesn't follow directions      Treatment Provided   Treatment provided  Cognitive-Linquistic      Cognitive-Linquistic Treatment   Treatment focused on  Aphasia    Skilled Treatment  SLP had pt make sentences from cards to improve MLU. With mod cues (inclding written) consistently, pt incr'd MLU to 5.75. SLP educated pt HOW to improve MLU but pt did not use SLP strategy (say where the object is or what it is for). Noted semantic and syntactic  aphasic errors. SLP educated pt DIL about phonemic and semantic cues and provided divergent naming for homework.       Assessment / Recommendations / Plan   Plan  Continue with current plan of care      Progression Toward Goals   Progression toward goals  Progressing toward goals       SLP Education - 09/14/19 1700    Education Details  how to cue (semantic and phonemic)    Person(s) Educated  Patient;Child(ren)    Methods  Explanation;Demonstration    Comprehension  Verbalized understanding;Need further instruction       SLP Short Term Goals - 09/12/19 1144      SLP SHORT TERM GOAL #1   Title  pt will produce sentences describing pictures with MLU = 5.5 (at least 25 utterances) in 2 sessions    Time  1    Period  Weeks    Status  Not Met      SLP SHORT TERM GOAL #2   Title  pt will communicate basic wants and needs functionally (reported by pt and/or family) with family with min questioning cues over 3 sessions    Time  1    Period  Weeks    Status  Achieved      SLP SHORT TERM GOAL #3   Title  pt and/or family will indicate 3  overt s/sx aspiration PNA with modified independence    Time  1    Period  Weeks    Status  On-going      SLP SHORT TERM GOAL #4   Title  pt will follow commands with superlatives or comparatives 80% with occasional min-mod A in 2 sessions    Time  1    Period  Weeks    Status  Not Met       SLP Long Term Goals - 09/14/19 1702      SLP LONG TERM GOAL #1   Title  pt will participate in simple conversation with MLU=7.0    Time  4    Period  Weeks   or 17 sessions, for all LTGs   Status  On-going      SLP LONG TERM GOAL #2   Title  pt will practice speech and language tasks at home at least 4 days/week at least 6 weeks    Time  4    Period  Weeks    Status  On-going      SLP LONG TERM GOAL #3   Title  pt will demo understanding of 10 minutes simple complex conversation with modified indpendence    Time  4    Period  Weeks    Status   Revised      SLP LONG TERM GOAL #4   Title  pt will demonstrate attention appropriate for efficacious completion of therapy tasks for 40 minutes in 8 sessions    Baseline  09-14-19    Time  4    Period  Weeks    Status  On-going       Plan - 09/14/19 1701    Clinical Impression Statement  Pt is limited [place] speaker (therefore therapy tasks were completed via live interpreter) who presents with receptive and expressive aphasia, 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 Pt remains on dys III/thin currently and daughter in law reports no difficulties with this at home. Pt ?able cognitive linquistic skills - testing to be completed as needed.. Pt would benefit from skilled ST targeting receptive and expressive language and verbal skills.    Speech Therapy Frequency  2x / week    Duration  --   8 weeks or 17 visits   Treatment/Interventions  Aspiration precaution training;Pharyngeal strengthening exercises;Diet toleration management by SLP;Trials of upgraded texture/liquids;Language facilitation;Cueing hierarchy;SLP instruction and feedback;Internal/external aids;Compensatory strategies;Patient/family education;Cognitive reorganization;Functional tasks;Environmental controls;Multimodal communcation approach    Potential to Achieve Goals  Fair    Potential Considerations  Severity of impairments;Cooperation/participation level       Patient will benefit from skilled therapeutic intervention in order to improve the following deficits and impairments:   Aphasia  Cognitive communication deficit    Problem List Patient Active Problem List   Diagnosis Date Noted  . Renal failure 07/25/2019  . Prediabetes 07/25/2019  . Left middle cerebral artery stroke ([place]) 07/25/2019  . 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 Dysphagia 07/25/2019  . 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 Acute  respiratory failure ([place])   . Tachycardia-bradycardia syndrome ([place])   . Atrial fibrillation with RVR ([place])   . Hypertension 07/23/2015  . Hyperlipidemia 07/23/2015  . 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 Hypothyroidism 01/25/2014    Jamie Mitchell ,Jamie Mitchell, Jamie Mitchell  09/14/2019, 5:03 PM  [facility] [address]  [place], [place], [address] Phone: [phone]   Fax:  [phone]   Name: Izabella Marcantel MRN: [id number] Date of Birth: 09/06/1945

## 2019-09-14 NOTE — Therapy (Signed)
[facility] [address] [place], [place], [address] Phone: [phone]   Fax:  [phone]  Occupational Therapy Treatment  Patient Details  Name: Jamie Mitchell MRN: [id number] Date of Birth: 26-Sep-1945 Referring Provider (OT): Dr. Letta Pate   Encounter Date: 09/14/2019       OCCUPATIONAL THERAPY DISCHARGE SUMMARY    Current functional level related to goals / functional outcomes: Pt demonstrates good overall progress towards goals.   Remaining deficits: Cognitive deficits, decreased RUE functional use   Education / Equipment: Pt/ family were educated regarding HEP. They verbalize understanding.  Plan: Patient agrees to discharge.  Patient goals were met. Patient is being discharged due to meeting the stated rehab goals.  ?????             OT End of Session - 09/14/19 0940    Visit Number  7    Number of Visits  9    Date for OT Re-Evaluation  09/15/19   date extended due to missed week   Authorization Type  medicare - will need PN every 10th visit    Authorization Time Period  week 4/4    Authorization - Visit Number  7    Authorization - Number of Visits  10    Progress Note Due on Visit  10    OT Start Time  [phone]    OT Stop Time  1015    OT Time Calculation (min)  38 min    Activity Tolerance  Patient tolerated treatment well    Behavior During Therapy  [name] for tasks assessed/performed       Past Medical History:  Diagnosis Date  . Hypertension   . Thyroid disease     Past Surgical History:  Procedure Laterality Date  . IR CT HEAD LTD  07/20/2019  . IR PERCUTANEOUS ART THROMBECTOMY/INFUSION INTRACRANIAL INC DIAG ANGIO  07/20/2019  . RADIOLOGY WITH ANESTHESIA N/A 07/20/2019   Procedure: IR WITH ANESTHESIA;  Surgeon: Luanne Bras, MD;  Location: [facility];  Service: Radiology;  Laterality: N/A;    There were no vitals filed for this visit.  Subjective Assessment -  09/14/19 1011    Subjective   Pt reports assisting with cooking at home    Currently in Pain?  No/denies             Treatment: Reviewed progress towards goals. Pt' dtr in law reports she cooked at home with supervision. Review of HEP, issued last visit. Pt demonstrates understanding. Arm bike x 6 mins level 1 for conditioning. Functional reaching to place graded clothespins on vertical antennae with RUE, for sustained pinch with RUE. Gripper set at level 1 to pick up 1 inch blocks with RUE for sustained grip, min v.c                   OT Long Term Goals - 09/14/19 0940      OT LONG TERM GOAL #1   Title  Pt and family will be mod I with HEP for grip strength and home activities program for balance, increased independence and activity tolerance - 09/11/2019    Status  Achieved      OT LONG TERM GOAL #2   Title  Pt will demonstrate improved grip strength in R hand by at least 4 pounds to assist with home mgmt tasks    Baseline  25 lbs on eval    Status  Achieved   32.6 lbs  OT LONG TERM GOAL #3   Title  Pt will require no more than supervision to cook simple familiar hot meal    Status  Achieved   met per dtr's report     OT LONG TERM GOAL #4   Title  Pt will demonstrate ability to complete at least 40 minutes of functional familiar activity with no more than one rest break    Status  Achieved   met per pt's dtr           Plan - 09/14/19 1000    Clinical Impression Statement  Pt demonstrates good overall progress. She agrees with plans for d/c.    OT Occupational Profile and History  Detailed Assessment- Review of Records and additional review of physical, cognitive, psychosocial history related to current functional performance    Occupational performance deficits (Please refer to evaluation for details):  ADL's;IADL's;Leisure;Social Participation;Rest and Sleep    Body Structure / Function / Physical Skills   ADL;Balance;Endurance;IADL;Mobility;Strength;UE functional use    Rehab Potential  Good    Clinical Decision Making  Several treatment options, min-mod task modification necessary    Comorbidities Affecting Occupational Performance:  May have comorbidities impacting occupational performance    Modification or Assistance to Complete Evaluation   Min-Moderate modification of tasks or assist with assess necessary to complete eval    OT Frequency  2x / week    OT Duration  4 weeks    OT Treatment/Interventions  Self-care/ADL training;Aquatic Therapy;Therapeutic exercise;Neuromuscular education;Energy conservation;Functional Mobility Training;Patient/family education;Cognitive remediation/compensation;Therapeutic activities;Balance training    Plan  d/c OT    Consulted and Agree with Plan of Care  Patient;Family member/caregiver    Family Member Consulted  dtr in Copy       Patient will benefit from skilled therapeutic intervention in order to improve the following deficits and impairments:   Body Structure / Function / Physical Skills: ADL, Balance, Endurance, IADL, Mobility, Strength, UE functional use       Visit Diagnosis: Muscle weakness (generalized)  Other abnormalities of gait and mobility  Other symptoms and signs involving cognitive functions following cerebral infarction  Hemiplegia and hemiparesis following cerebral infarction affecting right dominant side [facility])    Problem List Patient Active Problem List   Diagnosis Date Noted  . Renal failure 07/25/2019  . Prediabetes 07/25/2019  . 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 Dysphagia 07/25/2019  . 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 Acute respiratory failure ([place])   . Tachycardia-bradycardia syndrome ([place])   . Atrial fibrillation with RVR ([place])    . Hypertension 07/23/2015  . Hyperlipidemia 07/23/2015  . 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 Hypothyroidism 01/25/2014    Taliyah Watrous 09/14/2019, 10:11 AM Theone Murdoch, OTR/L [phone] Phone: [phone] 10:11 AM 09/14/19 [facility] [address] [place], [place], [address] Phone: [phone]   Fax:  [phone]  Name: Jamie Mitchell MRN: [id number] Date of Birth: [birth year]

## 2019-09-14 NOTE — Telephone Encounter (Signed)
Rx(s) sent to pharmacy electronically.  

## 2019-09-14 NOTE — Addendum Note (Signed)
Addended by: Fidel Levy on: 09/14/2019 03:35 PM   Modules accepted: Orders

## 2019-09-15 ENCOUNTER — Telehealth: Payer: Self-pay | Admitting: Cardiology

## 2019-09-15 DIAGNOSIS — I455 Other specified heart block: Secondary | ICD-10-CM

## 2019-09-15 DIAGNOSIS — I4891 Unspecified atrial fibrillation: Secondary | ICD-10-CM | POA: Diagnosis not present

## 2019-09-15 NOTE — Telephone Encounter (Signed)
Spoke with kelly, at 6:11 am this morning the patient had back to back pauses that equaled 6 secs. Spoke with pt with the help of interrupter [phone], the patient reports she does not have the monitor anymore, it has been mailed back to the company.

## 2019-09-15 NOTE — Telephone Encounter (Signed)
Kelly from [facility] calling with a critical zio patch reading.

## 2019-09-15 NOTE — Telephone Encounter (Signed)
Spoke with haydi at [employer], the pause they called about today actually happened on 08-27-19. They reports the patient has a total of 101 pauses on the monitor. Spoke with pt dtr-n-law, with the help interrupter # [id number],  aware there is no problem today, I will forward this information to dr Gardiner Rhyme and we will call them back once the monitor has been reviewed. She agreed with this plan.

## 2019-09-15 NOTE — Therapy (Signed)
[facility] [address] [place], [place], [address] Phone: [phone]   Fax:  [phone]  Physical Therapy Treatment  Patient Details  Name: Jamie Mitchell MRN: [id number] Date of Birth: [birth year] Referring Provider (PT): Alysia Penna, MD   Encounter Date: 09/14/2019  PT End of Session - 09/14/19 1102    Visit Number  6    Number of Visits  17    Date for PT Re-Evaluation  10/13/19    Authorization Type  Medicare-10th visit progress note    PT Start Time  1103    PT Stop Time  1144    PT Time Calculation (min)  41 min    Equipment Utilized During Treatment  Gait belt    Activity Tolerance  Patient tolerated treatment well    Behavior During Therapy  [facility] for tasks assessed/performed       Past Medical History:  Diagnosis Date  . Hypertension   . Thyroid disease     Past Surgical History:  Procedure Laterality Date  . IR CT HEAD LTD  07/20/2019  . IR PERCUTANEOUS ART THROMBECTOMY/INFUSION INTRACRANIAL INC DIAG ANGIO  07/20/2019  . RADIOLOGY WITH ANESTHESIA N/A 07/20/2019   Procedure: IR WITH ANESTHESIA;  Surgeon: Luanne Bras, MD;  Location: [facility];  Service: Radiology;  Laterality: N/A;    There were no vitals filed for this visit.  Subjective Assessment - 09/14/19 1101    Patient is accompained by:  Family member;Interpreter   in person interpreter; daughter in law   Limitations  Walking;House hold activities    Patient Stated Goals  Per interpretor:  improve speech and walking    Currently in Pain?  No/denies    Pain Score  0-No pain            [facility] Adult PT Treatment/Exercise - 09/14/19 1103      Transfers   Transfers  Sit to Stand;Stand to Sit    Sit to Stand  6: Modified independent (Device/Increase time)    Stand to Sit  6: Modified independent (Device/Increase time)      Ambulation/Gait   Ambulation/Gait  Yes    Ambulation/Gait Assistance  5: Supervision    Ambulation/Gait Assistance Details  gait indoors around track working on speed changes, scanning enviroment and sudden stops with min guard assist to supervision. gait outdoors with cues for arm swing, no balance loss noted.     Ambulation Distance (Feet)  345 Feet   x1, 500 x1   Assistive device  None    Gait Pattern  Step-through pattern;Narrow base of support    Ambulation Surface  Level;Indoor          Balance Exercises - 09/14/19 1113      Balance Exercises: Standing   SLS with Vectors Limitations  6 cones along edges of red/blue mats- alternating fwd foot taps to each with side stepping left<>right for 2 laps each way, no UE support with cues on base of support, stance position and weight shifting.     Rockerboard  Anterior/posterior;EO;EC;30 seconds;10 reps;Limitations;Head turns    Rockerboard Limitations  rocking the board with EO, progressing to EC; then holding the board steady for EC no head movements, progressing to EC head movements left<>right, up<>down. min guard to min assist with no UE support, cues on posture and weigh shifting to assist with balance.     Step Over Hurdles / Cones  6 hurdles of varied heights over red/blue mats- forward reciprocal stepping for 6  laps with min guard assist, no UE support; lateral stepping over for 3 laps each way. no UE support, min guard assist with cues for increased hip/knee flexion and step length needed at times.      Other Standing Exercises  gait for 2-3 laps around track while pt was self tossing ball, had pt describe her favorite dish to cook to interpreter. Leading questions needed to get information with min cues to stay on task with tossing ball, supervision for balance.           PT Short Term Goals - 09/12/19 1153      PT SHORT TERM GOAL #1   Title  Pt will be independent with initial HEP in order to indiciate improved functional mobility and decreased fall risk.  (Target Date: 09/13/19)    Baseline  09/12/19; met with  current program    Status  Achieved    Target Date  09/13/19      PT SHORT TERM GOAL #2   Title  Pt will improve FGA to >/=19/30 in order to indicate decreased fall risk.    Baseline  09/12/19: 23/30 scored today    Status  Achieved      PT SHORT TERM GOAL #3   Title  Pt will improve 5TSS to </=13 secs without UE support in order to indicate improved functional strength.    Baseline  09/12/19: 11.03 without UE support    Status  Achieved      PT SHORT TERM GOAL #4   Title  Pt will improve gait speed to >/=3.71 ft/sec with no overt LOB/sway in order to indicate more normal gait speed.    Baseline  09/12/19: 4.17 ft/sec no AD, no balance issues    Status  Achieved      PT SHORT TERM GOAL #5   Title  Pt will ambulate up/down stairs with single rail in alternating pattern at mod I level in order to indicate safe home entry/exit.    Baseline  09/12/19: met in session today    Status  Achieved        PT Long Term Goals - 09/12/19 1155      PT LONG TERM GOAL #1   Title  Pt will be independent with final HEP (with cues from family as needed) in order to indicate improved functional mobility and decreased fall risk.  (Target Date: 10/13/19)    Time  8    Period  Weeks    Status  On-going      PT LONG TERM GOAL #2   Title  Pt will improve FGA to >/=23/30 in order to indicate decreased fall risk.    Baseline  09/12/19: 23/30 scored today    Status  Achieved      PT LONG TERM GOAL #3   Title  Pt will ambulate >1000' over varying outdoor surfaces at mod I (S from a cognitive standpoint) level while scanning environment in order to indicate safe community mobility.    Time  8    Period  Weeks    Status  On-going      PT LONG TERM GOAL #4   Title  Pt will perform simulated gardening tasks at mod I level in order to return to leisure activities.    Time  8    Period  Weeks    Status  On-going            Plan - 09/14/19 1102    Clinical Impression Statement  Today's skilled  session continued to focus on gait on various surfaces and high level balance on compliant surface. Only fatigue was reported with rest breaks taken. The pt is progressing toward goals and should benefit from continued PT to progress toward unmet goals.    Personal Factors and Comorbidities  Comorbidity 3+;Age    Comorbidities  see above    Examination-Activity Limitations  Carry;Dressing;Stand;Lift;Stairs;Squat;Physicist, medical Activity;Laundry;Shop;Yard Work    [employer]  Evolving/Moderate complexity    Rehab Potential  Excellent    PT Frequency  2x / week    PT Duration  8 weeks    PT Treatment/Interventions  ADLs/Self Care Home Management;Aquatic Therapy;Gait training;Stair training;Functional mobility training;Therapeutic activities;Therapeutic exercise;Balance training;Neuromuscular re-education;Patient/family education;Passive range of motion;Vestibular    PT Next Visit Plan  Continue to add to HEP as needed (update as needed) for high level balance and R LE strength (esp hip), outdoor gait, wider BOS with gait, turns, compliant surfaces.    Consulted and Agree with Plan of Care  Patient;Family member/caregiver    Family Member Consulted  daughter in law via interpreter       Patient will benefit from skilled therapeutic intervention in order to improve the following deficits and impairments:  Abnormal gait, Decreased activity tolerance, Decreased balance, Decreased cognition, Decreased endurance, Decreased mobility, Decreased strength, Impaired perceived functional ability, Improper body mechanics, Postural dysfunction  Visit Diagnosis: Muscle weakness (generalized)  Other abnormalities of gait and mobility  Unsteadiness on feet     Problem List Patient Active Problem List   Diagnosis Date Noted  . Renal failure 07/25/2019  . Prediabetes 07/25/2019  . Left middle cerebral artery  stroke ([place]) 07/25/2019  . 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 Dysphagia 07/25/2019  . 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 Acute respiratory failure ([place])   . Tachycardia-bradycardia syndrome ([place])   . Atrial fibrillation with RVR ([place])   . Hypertension 07/23/2015  . Hyperlipidemia 07/23/2015  . 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 Hypothyroidism 01/25/2014    Willow Ora, PTA, [facility] [address] [place], [place] [address] [phone] 09/15/19, 2:39 PM  Name: Roben Tatsch MRN: [id number] Date of Birth: [birth year]

## 2019-09-17 NOTE — Telephone Encounter (Signed)
Monitor reviewed, having frequent sinus pauses up to 3.3 seconds, which occurred during the daytime.  Recommend referral to EP for pacemaker evaluation.

## 2019-09-18 NOTE — Addendum Note (Signed)
Addended by: Patria Mane A on: 09/18/2019 06:04 PM   Modules accepted: Orders

## 2019-09-18 NOTE — Telephone Encounter (Signed)
Called patient via interpreter (ID [phone] aware of monitor results and recommendations.   EP referral placed.   Patient aware and verbalized understanding.

## 2019-09-20 ENCOUNTER — Ambulatory Visit: Payer: Medicare Other

## 2019-09-20 ENCOUNTER — Encounter: Payer: Self-pay | Admitting: Physical Therapy

## 2019-09-20 ENCOUNTER — Other Ambulatory Visit: Payer: Self-pay

## 2019-09-20 ENCOUNTER — Encounter: Payer: Medicare Other | Admitting: Occupational Therapy

## 2019-09-20 ENCOUNTER — Ambulatory Visit: Payer: Medicare Other | Admitting: Physical Therapy

## 2019-09-20 DIAGNOSIS — R2689 Other abnormalities of gait and mobility: Secondary | ICD-10-CM | POA: Diagnosis not present

## 2019-09-20 DIAGNOSIS — R482 Apraxia: Secondary | ICD-10-CM | POA: Diagnosis not present

## 2019-09-20 DIAGNOSIS — R2681 Unsteadiness on feet: Secondary | ICD-10-CM | POA: Diagnosis not present

## 2019-09-20 DIAGNOSIS — I69318 Other symptoms and signs involving cognitive functions following cerebral infarction: Secondary | ICD-10-CM | POA: Diagnosis not present

## 2019-09-20 DIAGNOSIS — M6281 Muscle weakness (generalized): Secondary | ICD-10-CM | POA: Diagnosis not present

## 2019-09-20 DIAGNOSIS — R4701 Aphasia: Secondary | ICD-10-CM

## 2019-09-20 DIAGNOSIS — I69351 Hemiplegia and hemiparesis following cerebral infarction affecting right dominant side: Secondary | ICD-10-CM | POA: Diagnosis not present

## 2019-09-20 NOTE — Therapy (Signed)
[facility] [address] [place], [place], [address] Phone: [phone]   Fax:  [phone]  Speech Language Pathology Treatment  Patient Details  Name: Jamie Mitchell MRN: [id number] Date of Birth: [birth year] Referring Provider (SLP): Alysia Penna, MD   Encounter Date: 09/20/2019  End of Session - 09/20/19 1042    Visit Number  9    Number of Visits  17    Date for SLP Re-Evaluation  11/17/19    SLP Start Time  0934    SLP Stop Time   8099    SLP Time Calculation (min)  41 min    Activity Tolerance  Patient tolerated treatment well       Past Medical History:  Diagnosis Date  . Hypertension   . Thyroid disease     Past Surgical History:  Procedure Laterality Date  . IR CT HEAD LTD  07/20/2019  . IR PERCUTANEOUS ART THROMBECTOMY/INFUSION INTRACRANIAL INC DIAG ANGIO  07/20/2019  . RADIOLOGY WITH ANESTHESIA N/A 07/20/2019   Procedure: IR WITH ANESTHESIA;  Surgeon: Luanne Bras, MD;  Location: [facility];  Service: Radiology;  Laterality: N/A;    There were no vitals filed for this visit.  Subjective Assessment - 09/20/19 0943    Subjective  Pt reports no coughing/choking/throat clearing with meals.    Patient is accompained by:  Family member;Interpreter    Currently in Pain?  No/denies            ADULT SLP TREATMENT - 09/20/19 0945      General Information   Behavior/Cognition  Alert;Cooperative;Pleasant mood;Distractible;Decreased sustained attention;Requires cueing;Doesn't follow directions      Treatment Provided   Treatment provided  Cognitive-Linquistic      Cognitive-Linquistic Treatment   Treatment focused on  Aphasia    Skilled Treatment  SLP used semantic feature analysis (SFA) with pt today with common objects. Pt req'd mod A usually - many of pt's responses were "I don't know." SLP observed that pt exhibited perseveration and some semantic paraphasias, per interpreter.  Semantic cues appeared to have little affect on pt success and phonemic cues appeared to have more success.       Assessment / Recommendations / Plan   Plan  Continue with current plan of care      Progression Toward Goals   Progression toward goals  Progressing toward goals         SLP Short Term Goals - 09/12/19 1144      SLP SHORT TERM GOAL #1   Title  pt will produce sentences describing pictures with MLU = 5.5 (at least 25 utterances) in 2 sessions    Time  1    Period  Weeks    Status  Not Met      SLP SHORT TERM GOAL #2   Title  pt will communicate basic wants and needs functionally (reported by pt and/or family) with family with min questioning cues over 3 sessions    Time  1    Period  Weeks    Status  Achieved      SLP SHORT TERM GOAL #3   Title  pt and/or family will indicate 3 overt s/sx aspiration PNA with modified independence    Time  1    Period  Weeks    Status  On-going      SLP SHORT TERM GOAL #4   Title  pt will follow commands with superlatives or comparatives 80% with occasional min-mod A in  2 sessions    Time  1    Period  Weeks    Status  Not Met       SLP Long Term Goals - 09/20/19 1044      SLP LONG TERM GOAL #1   Title  pt will participate in simple conversation with MLU=7.0    Time  3    Period  Weeks   or 17 sessions, for all LTGs   Status  On-going      SLP LONG TERM GOAL #2   Title  pt will practice speech and language tasks at home at least 4 days/week at least 6 weeks    Time  3    Period  Weeks    Status  On-going      SLP LONG TERM GOAL #3   Title  pt will demo understanding of 10 minutes simple complex conversation with modified indpendence    Time  3    Period  Weeks    Status  Revised      SLP LONG TERM GOAL #4   Title  pt will demonstrate attention appropriate for efficacious completion of therapy tasks for 40 minutes in 8 sessions    Baseline  09-14-19    Time  3    Period  Weeks    Status  On-going        Plan - 09/20/19 1042    Clinical Impression Statement  Pt is limited [place] speaker (therefore therapy tasks were completed via live interpreter) who presents with receptive and expressive aphasia, 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 Pt reports no overt s/sx aspiration with her current diet - daughter in law does not disagree with pt. Pt currently recommended dys III/thin. Pt would cont to benefit from skilled ST targeting receptive and expressive language and verbal skills.    Speech Therapy Frequency  2x / week    Duration  --   8 weeks or 17 visits   Treatment/Interventions  Aspiration precaution training;Pharyngeal strengthening exercises;Diet toleration management by SLP;Trials of upgraded texture/liquids;Language facilitation;Cueing hierarchy;SLP instruction and feedback;Internal/external aids;Compensatory strategies;Patient/family education;Cognitive reorganization;Functional tasks;Environmental controls;Multimodal communcation approach    Potential to Achieve Goals  Fair    Potential Considerations  Severity of impairments;Cooperation/participation level       Patient will benefit from skilled therapeutic intervention in order to improve the following deficits and impairments:   Aphasia  Verbal apraxia    Problem List Patient Active Problem List   Diagnosis Date Noted  . Renal failure 07/25/2019  . Prediabetes 07/25/2019  . 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 Dysphagia 07/25/2019  . 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 Acute respiratory failure ([place])   . Tachycardia-bradycardia syndrome ([place])   . Atrial fibrillation with RVR ([place])   . Hypertension 07/23/2015  . Hyperlipidemia 07/23/2015  . 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 Hypothyroidism 01/25/2014    [facility] ,[place],  [place]  09/20/2019, 10:47 AM  [facility] [address] [place], [place], [address] Phone: [phone]   Fax:  [phone]   Name: Jamie Mitchell MRN: [id number] Date of Birth: 11-Aug-1945

## 2019-09-21 NOTE — Therapy (Signed)
[facility] [address] [place], [place], [address] Phone: [phone]   Fax:  [phone]  Physical Therapy Treatment  Patient Details  Name: Jamie Mitchell MRN: [id number] Date of Birth: 06-26-1945 Referring Provider (PT): Alysia Penna, MD   Encounter Date: 09/20/2019  PT End of Session - 09/20/19 1020    Visit Number  7    Number of Visits  17    Date for PT Re-Evaluation  10/13/19    Authorization Type  Medicare-10th visit progress note    PT Start Time  1018    PT Stop Time  1057    PT Time Calculation (min)  39 min    Equipment Utilized During Treatment  Gait belt    Activity Tolerance  Patient tolerated treatment well    Behavior During Therapy  [facility] for tasks assessed/performed       Past Medical History:  Diagnosis Date  . Hypertension   . Thyroid disease     Past Surgical History:  Procedure Laterality Date  . IR CT HEAD LTD  07/20/2019  . IR PERCUTANEOUS ART THROMBECTOMY/INFUSION INTRACRANIAL INC DIAG ANGIO  07/20/2019  . RADIOLOGY WITH ANESTHESIA N/A 07/20/2019   Procedure: IR WITH ANESTHESIA;  Surgeon: Luanne Bras, MD;  Location: [facility];  Service: Radiology;  Laterality: N/A;    There were no vitals filed for this visit.  Subjective Assessment - 09/20/19 1020    Subjective  No new complaints. No falls or pain to report.    Patient is accompained by:  Family member;Interpreter    Limitations  Walking;House hold activities    Patient Stated Goals  Per interpretor:  improve speech and walking    Currently in Pain?  No/denies            [facility] Adult PT Treatment/Exercise - 09/20/19 1021      Transfers   Transfers  Sit to Stand;Stand to Sit    Sit to Stand  6: Modified independent (Device/Increase time)    Stand to Sit  6: Modified independent (Device/Increase time)      Ambulation/Gait   Ambulation/Gait  Yes    Ambulation/Gait Assistance  6: Modified independent  (Device/Increase time);5: Supervision    Ambulation/Gait Assistance Details  gait around track working on speed changes, scanning all directions and sudden stops/starts with supervision assist only. no balance issues noted.     Ambulation Distance (Feet)  345 Feet   x1   Assistive device  None    Gait Pattern  Step-through pattern;Narrow base of support    Ambulation Surface  Level;Indoor      Knee/Hip Exercises: Aerobic   Other Aerobic  Scifit level 1.5 with UE/LE's with goal >/= 60 rpm for 8 mintues for strengthening and activity tolerance          Balance Exercises - 09/20/19 1031      Balance Exercises: Standing   Standing Eyes Closed  Narrow base of support (BOS);Wide (BOA);Head turns;Foam/compliant surface;Other reps (comment);30 secs;Limitations    Standing Eyes Closed Limitations  on airex no UE support: feet together for EC no head movements, then feet apart for EC head movements left<>right, then up<>down. min guard assist for balance.     Step Ups  Forward;UE support 2;Limitations    Step Ups Limitations  On BOSU blue side up, alternating LE's with light bil UE support on bars.     Other Standing Exercises  on inverted BOSU: single leg stance in center with contralateral kicks  fwd/side/back. max multimodal cues needed on ex technique. Pt continued to have difficullty performing activity despite verbal/tactile/visual cues.           PT Short Term Goals - 09/12/19 1153      PT SHORT TERM GOAL #1   Title  Pt will be independent with initial HEP in order to indiciate improved functional mobility and decreased fall risk.  (Target Date: 09/13/19)    Baseline  09/12/19; met with current program    Status  Achieved    Target Date  09/13/19      PT SHORT TERM GOAL #2   Title  Pt will improve FGA to >/=19/30 in order to indicate decreased fall risk.    Baseline  09/12/19: 23/30 scored today    Status  Achieved      PT SHORT TERM GOAL #3   Title  Pt will improve 5TSS to </=13  secs without UE support in order to indicate improved functional strength.    Baseline  09/12/19: 11.03 without UE support    Status  Achieved      PT SHORT TERM GOAL #4   Title  Pt will improve gait speed to >/=3.71 ft/sec with no overt LOB/sway in order to indicate more normal gait speed.    Baseline  09/12/19: 4.17 ft/sec no AD, no balance issues    Status  Achieved      PT SHORT TERM GOAL #5   Title  Pt will ambulate up/down stairs with single rail in alternating pattern at mod I level in order to indicate safe home entry/exit.    Baseline  09/12/19: met in session today    Status  Achieved        PT Long Term Goals - 09/12/19 1155      PT LONG TERM GOAL #1   Title  Pt will be independent with final HEP (with cues from family as needed) in order to indicate improved functional mobility and decreased fall risk.  (Target Date: 10/13/19)    Time  8    Period  Weeks    Status  On-going      PT LONG TERM GOAL #2   Title  Pt will improve FGA to >/=23/30 in order to indicate decreased fall risk.    Baseline  09/12/19: 23/30 scored today    Status  Achieved      PT LONG TERM GOAL #3   Title  Pt will ambulate >1000' over varying outdoor surfaces at mod I (S from a cognitive standpoint) level while scanning environment in order to indicate safe community mobility.    Time  8    Period  Weeks    Status  On-going      PT LONG TERM GOAL #4   Title  Pt will perform simulated gardening tasks at mod I level in order to return to leisure activities.    Time  8    Period  Weeks    Status  On-going            Plan - 09/20/19 1020    Clinical Impression Statement  Today's skilled session continued to focus on activity tolerance, strengthening and balance reactions. Pt continues to report fatigue with increased activity, however demo's significant balance gains. Pt may meet LTGs early.    Personal Factors and Comorbidities  Comorbidity 3+;Age    Comorbidities  see above     Examination-Activity Limitations  Carry;Dressing;Stand;Lift;Stairs;Squat;Physicist, medical  Activity;Laundry;Shop;Yard Work    [employer]chant [employer] officer  Evolving/Moderate complexity    Rehab Potential  Excellent    PT Frequency  2x / week    PT Duration  8 weeks    PT Treatment/Interventions  ADLs/Self Care Home Management;Aquatic Therapy;Gait training;Stair training;Functional mobility training;Therapeutic activities;Therapeutic exercise;Balance training;Neuromuscular re-education;Patient/family education;Passive range of motion;Vestibular    PT Next Visit Plan  ? discharge sooner than end of plan of care as pt has progressed significantly, continues to be limited with cognition and fatigue.    Consulted and Agree with Plan of Care  Patient;Family member/caregiver    Family Member Consulted  daughter in law via interpreter       Patient will benefit from skilled therapeutic intervention in order to improve the following deficits and impairments:  Abnormal gait, Decreased activity tolerance, Decreased balance, Decreased cognition, Decreased endurance, Decreased mobility, Decreased strength, Impaired perceived functional ability, Improper body mechanics, Postural dysfunction  Visit Diagnosis: Muscle weakness (generalized)  Other abnormalities of gait and mobility  Unsteadiness on feet     Problem List Patient Active Problem List   Diagnosis Date Noted  . Renal failure 07/25/2019  . Prediabetes 07/25/2019  . 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 Dysphagia 07/25/2019  . 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 Acute respiratory failure ([place])   . Tachycardia-bradycardia syndrome ([place])   . Atrial fibrillation with RVR ([place])   . Hypertension  07/23/2015  . Hyperlipidemia 07/23/2015  . 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 Hypothyroidism 01/25/2014    Willow Ora, PTA, [facility] [address], [place], [place] [address] [phone] 09/21/19, 5:37 PM   Name: Jamie Mitchell MRN: [id number] Date of Birth: 1945-10-21

## 2019-09-22 ENCOUNTER — Encounter: Payer: Medicare Other | Admitting: Occupational Therapy

## 2019-09-22 ENCOUNTER — Other Ambulatory Visit: Payer: Self-pay

## 2019-09-22 ENCOUNTER — Ambulatory Visit: Payer: Medicare Other

## 2019-09-22 ENCOUNTER — Ambulatory Visit: Payer: Medicare Other | Admitting: Physical Therapy

## 2019-09-22 ENCOUNTER — Encounter: Payer: Self-pay | Admitting: Physical Therapy

## 2019-09-22 DIAGNOSIS — M6281 Muscle weakness (generalized): Secondary | ICD-10-CM

## 2019-09-22 DIAGNOSIS — R4701 Aphasia: Secondary | ICD-10-CM

## 2019-09-22 DIAGNOSIS — R2689 Other abnormalities of gait and mobility: Secondary | ICD-10-CM | POA: Diagnosis not present

## 2019-09-22 DIAGNOSIS — R2681 Unsteadiness on feet: Secondary | ICD-10-CM | POA: Diagnosis not present

## 2019-09-22 DIAGNOSIS — R482 Apraxia: Secondary | ICD-10-CM | POA: Diagnosis not present

## 2019-09-22 DIAGNOSIS — I69318 Other symptoms and signs involving cognitive functions following cerebral infarction: Secondary | ICD-10-CM | POA: Diagnosis not present

## 2019-09-22 DIAGNOSIS — I69351 Hemiplegia and hemiparesis following cerebral infarction affecting right dominant side: Secondary | ICD-10-CM | POA: Diagnosis not present

## 2019-09-22 DIAGNOSIS — R41841 Cognitive communication deficit: Secondary | ICD-10-CM

## 2019-09-22 NOTE — Therapy (Signed)
Jamie Mitchell [address] [place], [place], [address] Phone: [phone]   Fax:  [phone]  Speech Language Pathology Treatment  Patient Details  Name: Jamie Mitchell MRN: [id number] Date of Birth: [birth year] Referring Provider (SLP): Alysia Penna, MD   Encounter Date: 09/22/2019  End of Session - 09/22/19 1004    Visit Number  10    Number of Visits  17    Date for SLP Re-Evaluation  11/17/19    SLP Start Time  0850    SLP Stop Time   0930    SLP Time Calculation (min)  40 min    Activity Tolerance  Patient tolerated treatment well       Past Medical History:  Diagnosis Date  . Hypertension   . Thyroid disease     Past Surgical History:  Procedure Laterality Date  . IR CT HEAD LTD  07/20/2019  . IR PERCUTANEOUS ART THROMBECTOMY/INFUSION INTRACRANIAL INC DIAG ANGIO  07/20/2019  . RADIOLOGY WITH ANESTHESIA N/A 07/20/2019   Procedure: IR WITH ANESTHESIA;  Surgeon: Luanne Bras, MD;  Location: [facility];  Service: Radiology;  Laterality: N/A;    There were no vitals filed for this visit.  Subjective Assessment - 09/22/19 0858    Patient is accompained by:  Interpreter;Family member   DIL, Jamie Bouchard ([place])   Currently in Pain?  No/denies            ADULT SLP TREATMENT - 09/22/19 0857      General Information   Behavior/Cognition  Alert;Cooperative;Pleasant mood;Requires cueing      Treatment Provided   Treatment provided  Cognitive-Linquistic      Cognitive-Linquistic Treatment   Treatment focused on  Aphasia    Skilled Treatment  SLP used divergent naming task today to assist with anomia. Pt with perseveration without awareness, mod cues needed for more success. SLP educated pt re: compensations for anomia and noted with SLP descriptions pt was commenting like she was going to perform the examples SLP gave. SLP unsure if translation issue or aauditory comprehension issue. SLP  to cont to monitor.      Assessment / Recommendations / Plan   Plan  Continue with current plan of care      Progression Toward Goals   Progression toward goals  Progressing toward goals       SLP Education - 09/22/19 1004    Education Details  compensations for anomia    Person(s) Educated  Patient;Caregiver(s)    Methods  Explanation;Demonstration;Verbal cues;Handout    Comprehension  Verbalized understanding;Verbal cues required;Need further instruction       SLP Short Term Goals - 09/12/19 1144      SLP SHORT TERM GOAL #1   Title  pt will produce sentences describing pictures with MLU = 5.5 (at least 25 utterances) in 2 sessions    Time  1    Period  Weeks    Status  Not Met      SLP SHORT TERM GOAL #2   Title  pt will communicate basic wants and needs functionally (reported by pt and/or family) with family with min questioning cues over 3 sessions    Time  1    Period  Weeks    Status  Achieved      SLP SHORT TERM GOAL #3   Title  pt and/or family will indicate 3 overt s/sx aspiration PNA with modified independence    Time  1    Period  Weeks    Status  On-going      SLP SHORT TERM GOAL #4   Title  pt will follow commands with superlatives or comparatives 80% with occasional min-mod A in 2 sessions    Time  1    Period  Weeks    Status  Not Met       SLP Long Term Goals - 09/20/19 1044      SLP LONG TERM GOAL #1   Title  pt will participate in simple conversation with MLU=7.0    Time  3    Period  Weeks   or 17 sessions, for all LTGs   Status  On-going      SLP LONG TERM GOAL #2   Title  pt will practice speech and language tasks at home at least 4 days/week at least 6 weeks    Time  3    Period  Weeks    Status  On-going      SLP LONG TERM GOAL #3   Title  pt will demo understanding of 10 minutes simple complex conversation with modified indpendence    Time  3    Period  Weeks    Status  Revised      SLP LONG TERM GOAL #4   Title  pt will  demonstrate attention appropriate for efficacious completion of therapy tasks for 40 minutes in 8 sessions    Baseline  09-14-19    Time  3    Period  Weeks    Status  On-going       Plan - 09/22/19 1005    Clinical Impression Statement  Pt is limited [place] speaker (therefore therapy tasks were completed via live interpreter) who presents with receptive and expressive aphasia, 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 Pt reports no overt s/sx aspiration with her current diet - daughter in law does not disagree with pt. Pt currently recommended dys III/thin. Pt would cont to benefit from skilled ST targeting receptive and expressive language and verbal skills.    Speech Therapy Frequency  2x / week    Duration  --   8 weeks or 17 visits   Treatment/Interventions  Aspiration precaution training;Pharyngeal strengthening exercises;Diet toleration management by SLP;Trials of upgraded texture/liquids;Language facilitation;Cueing hierarchy;SLP instruction and feedback;Internal/external aids;Compensatory strategies;Patient/family education;Cognitive reorganization;Functional tasks;Environmental controls;Multimodal communcation approach    Potential to Achieve Goals  Fair    Potential Considerations  Severity of impairments;Cooperation/participation level       Patient will benefit from skilled therapeutic intervention in order to improve the following deficits and impairments:   Aphasia  Verbal apraxia  Cognitive communication deficit   Speech Therapy Progress Note  Dates of Reporting Period: 08-14-19 to present  Subjective Statement: Pt has been seen for 10 visits targeting aphasia.  Objective Measurements: Pt spontaneous speech has improved in length and complexity.  Goal Update: See above  Plan: Cont to see pt for skilled ST  Reason Skilled Services are Required: Pt has not yet reached max potential.    Problem List Patient Active Problem List   Diagnosis Date Noted  . Renal  failure 07/25/2019  . Prediabetes 07/25/2019  . 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 Dysphagia 07/25/2019  . 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 Acute respiratory failure ([place])   . Tachycardia-bradycardia syndrome ([place])   . Atrial fibrillation  with RVR ([place])   . Hypertension 07/23/2015  . Hyperlipidemia 07/23/2015  . 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 Hypothyroidism 01/25/2014    [facility] ,[place], [place]  09/22/2019, 10:06 AM  [facility] [address] [place], [place], [address] Phone: [phone]   Fax:  [phone]   Name: Venissa Nappi MRN: [id number] Date of Birth: Jul 25, 1945

## 2019-09-22 NOTE — Therapy (Signed)
[facility] [address] [place], [place], [address] Phone: [phone]   Fax:  [phone]  Physical Therapy Treatment  Patient Details  Name: Jamie Mitchell MRN: [id number] Date of Birth: [birth year] Referring Provider (PT): Jamie Penna, MD   Encounter Date: 09/22/2019  PT End of Session - 09/22/19 0933    Visit Number  8    Number of Visits  17    Date for PT Re-Evaluation  10/13/19    Authorization Type  Medicare-10th visit progress note    PT Start Time  0932    PT Stop Time  1011    PT Time Calculation (min)  39 min    Equipment Utilized During Treatment  Gait belt    Activity Tolerance  Patient tolerated treatment well    Behavior During Therapy  Jamie Mitchell for tasks assessed/performed       Past Medical History:  Diagnosis Date  . Hypertension   . Thyroid disease     Past Surgical History:  Procedure Laterality Date  . IR CT HEAD LTD  07/20/2019  . IR PERCUTANEOUS ART THROMBECTOMY/INFUSION INTRACRANIAL INC DIAG ANGIO  07/20/2019  . RADIOLOGY WITH ANESTHESIA N/A 07/20/2019   Procedure: IR WITH ANESTHESIA;  Surgeon: Jamie Bras, MD;  Location: [facility];  Service: Radiology;  Laterality: N/A;    There were no vitals filed for this visit.  Subjective Assessment - 09/22/19 0933    Subjective  No new complaints. No falls or pain to report.    Patient is accompained by:  Family member;Interpreter   [name] [name] [phone] for second half of session   Limitations  Walking;House hold activities    Patient Stated Goals  Per interpretor:  improve speech and walking    Currently in Pain?  No/denies             [facility] Adult PT Treatment/Exercise - 09/22/19 0934      Transfers   Transfers  Sit to Stand;Stand to Sit    Sit to Stand  6: Modified independent (Device/Increase time)    Stand to Sit  6: Modified independent (Device/Increase time)      Ambulation/Gait   Ambulation/Gait  Yes     Ambulation/Gait Assistance  6: Modified independent (Device/Increase time);5: Supervision    Ambulation/Gait Assistance Details  no imbalance noted with gait with pt looking around all directions randomly.     Ambulation Distance (Feet)  500 Feet    Assistive device  None    Gait Pattern  Step-through pattern;Narrow base of support    Ambulation Surface  Level;Unlevel;Indoor;Outdoor;Paved      Knee/Hip Exercises: Aerobic   Other Aerobic  Scifit level 1.5 with UE/LE's with goal >/= 60 rpm for 8 mintues for strengthening and activity tolerance          Balance Exercises - 09/22/19 1008      Balance Exercises: Standing   SLS with Vectors  Foam/compliant surface;Other reps (comment);Limitations    SLS with Vectors Limitations  standing across red beam with 2 tall cones in front- alternating fwd foot taps, progressing to alternating cross foot taps for ~10 reps each, no UE support with cues on stance position, weight shifting. min guard assist for balance.     Rockerboard  Anterior/posterior;Lateral;Head turns;EO;EC;30 seconds;10 reps;Limitations    Rockerboard Limitations  performed both ways on balance board: holding board steady for EC no head movements, progressing to EC head movements left<>right, up<>down with min guard to min assist for balance;  then rocking the board with emphasis on tall posture with EO, progressing to EC, no UE support with cues on posture/weight shifting for balance assistance.           PT Short Term Goals - 09/12/19 1153      PT SHORT TERM GOAL #1   Title  Pt will be independent with initial HEP in order to indiciate improved functional mobility and decreased fall risk.  (Target Date: 09/13/19)    Baseline  09/12/19; met with current program    Status  Achieved    Target Date  09/13/19      PT SHORT TERM GOAL #2   Title  Pt will improve FGA to >/=19/30 in order to indicate decreased fall risk.    Baseline  09/12/19: 23/30 scored today    Status   Achieved      PT SHORT TERM GOAL #3   Title  Pt will improve 5TSS to </=13 secs without UE support in order to indicate improved functional strength.    Baseline  09/12/19: 11.03 without UE support    Status  Achieved      PT SHORT TERM GOAL #4   Title  Pt will improve gait speed to >/=3.71 ft/sec with no overt LOB/sway in order to indicate more normal gait speed.    Baseline  09/12/19: 4.17 ft/sec no AD, no balance issues    Status  Achieved      PT SHORT TERM GOAL #5   Title  Pt will ambulate up/down stairs with single rail in alternating pattern at mod I level in order to indicate safe home entry/exit.    Baseline  09/12/19: met in session today    Status  Achieved        PT Long Term Goals - 09/12/19 1155      PT LONG TERM GOAL #1   Title  Pt will be independent with final HEP (with cues from family as needed) in order to indicate improved functional mobility and decreased fall risk.  (Target Date: 10/13/19)    Time  8    Period  Weeks    Status  On-going      PT LONG TERM GOAL #2   Title  Pt will improve FGA to >/=23/30 in order to indicate decreased fall risk.    Baseline  09/12/19: 23/30 scored today    Status  Achieved      PT LONG TERM GOAL #3   Title  Pt will ambulate >1000' over varying outdoor surfaces at mod I (S from a cognitive standpoint) level while scanning environment in order to indicate safe community mobility.    Time  8    Period  Weeks    Status  On-going      PT LONG TERM GOAL #4   Title  Pt will perform simulated gardening tasks at mod I level in order to return to leisure activities.    Time  8    Period  Weeks    Status  On-going            Plan - 09/22/19 0933    Clinical Impression Statement  Today's skilled session continued to focus on activity tolerance and balance reactions. Pt's balance has greatly improved. Continues to get fatigued with increased activity. Most difficulty is with following cues/instructions for tasks, ? cognition vs  language barriers.    Personal Factors and Comorbidities  Comorbidity 3+;Age    Comorbidities  see above  Examination-Activity Limitations  Carry;Dressing;Stand;Lift;Stairs;Squat;Physicist, medical Activity;Laundry;Shop;Yard Work    Merchant [employer] officer  Evolving/Moderate complexity    Rehab Potential  Excellent    PT Frequency  2x / week    PT Duration  8 weeks    PT Treatment/Interventions  ADLs/Self Care Home Management;Aquatic Therapy;Gait training;Stair training;Functional mobility training;Therapeutic activities;Therapeutic exercise;Balance training;Neuromuscular re-education;Patient/family education;Passive range of motion;Vestibular    PT Next Visit Plan  ? discharge sooner than end of plan of care as pt has progressed significantly, continues to be limited with cognition and fatigue.    Consulted and Agree with Plan of Care  Patient;Family member/caregiver    Family Member Consulted  daughter in law via interpreter       Patient will benefit from skilled therapeutic intervention in order to improve the following deficits and impairments:  Abnormal gait, Decreased activity tolerance, Decreased balance, Decreased cognition, Decreased endurance, Decreased mobility, Decreased strength, Impaired perceived functional ability, Improper body mechanics, Postural dysfunction  Visit Diagnosis: Muscle weakness (generalized)  Other abnormalities of gait and mobility  Unsteadiness on feet     Problem List Patient Active Problem List   Diagnosis Date Noted  . Renal failure 07/25/2019  . Prediabetes 07/25/2019  . 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 Dysphagia 07/25/2019  . 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 Acute respiratory failure ([place])    . Tachycardia-bradycardia syndrome ([place])   . Atrial fibrillation with RVR ([place])   . Hypertension 07/23/2015  . Hyperlipidemia 07/23/2015  . 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 Hypothyroidism 01/25/2014    Willow Ora, PTA, [facility] [address] [place], [place] [phone] 09/22/19, 11:48 AM   Name: Jamie Mitchell MRN: [id number] Date of Birth: 11-12-1945

## 2019-09-22 NOTE — Patient Instructions (Signed)
    Use un sinnimo para la palabra en la que est tratando de pensar.  Puede cambiar su oracin para usar [employer] surveyor.  Puede describir un Sharion Dove persona o un lugar cuyo nombre est intentando recordar.

## 2019-09-25 ENCOUNTER — Encounter: Payer: Self-pay | Admitting: Rehabilitation

## 2019-09-25 ENCOUNTER — Other Ambulatory Visit: Payer: Self-pay

## 2019-09-25 ENCOUNTER — Encounter: Payer: Medicare Other | Admitting: Occupational Therapy

## 2019-09-25 ENCOUNTER — Ambulatory Visit: Payer: Medicare Other | Attending: Family Medicine | Admitting: Rehabilitation

## 2019-09-25 ENCOUNTER — Ambulatory Visit: Payer: Medicare Other

## 2019-09-25 DIAGNOSIS — R41841 Cognitive communication deficit: Secondary | ICD-10-CM | POA: Diagnosis not present

## 2019-09-25 DIAGNOSIS — R2681 Unsteadiness on feet: Secondary | ICD-10-CM | POA: Insufficient documentation

## 2019-09-25 DIAGNOSIS — R4701 Aphasia: Secondary | ICD-10-CM

## 2019-09-25 DIAGNOSIS — R2689 Other abnormalities of gait and mobility: Secondary | ICD-10-CM | POA: Insufficient documentation

## 2019-09-25 DIAGNOSIS — R482 Apraxia: Secondary | ICD-10-CM

## 2019-09-25 DIAGNOSIS — M6281 Muscle weakness (generalized): Secondary | ICD-10-CM | POA: Insufficient documentation

## 2019-09-25 NOTE — Therapy (Signed)
[facility] [address] [place], [place], [address] Phone: [phone]   Fax:  [phone]  Speech Language Pathology Treatment  Patient Details  Name: Jamie Mitchell MRN: [id number] Date of Birth: 1946/05/08 Referring Provider (SLP): Alysia Penna, MD   Encounter Date: 09/25/2019  End of Session - 09/25/19 1247    Visit Number  11    Number of Visits  17    Date for SLP Re-Evaluation  11/17/19    SLP Start Time  1150    SLP Stop Time   1230    SLP Time Calculation (min)  40 min    Activity Tolerance  Patient tolerated treatment well       Past Medical History:  Diagnosis Date  . Hypertension   . Thyroid disease     Past Surgical History:  Procedure Laterality Date  . IR CT HEAD LTD  07/20/2019  . IR PERCUTANEOUS ART THROMBECTOMY/INFUSION INTRACRANIAL INC DIAG ANGIO  07/20/2019  . RADIOLOGY WITH ANESTHESIA N/A 07/20/2019   Procedure: IR WITH ANESTHESIA;  Surgeon: Luanne Bras, MD;  Location: [facility];  Service: Radiology;  Laterality: N/A;    There were no vitals filed for this visit.         ADULT SLP TREATMENT - 09/25/19 1207      General Information   Behavior/Cognition  Alert;Cooperative;Pleasant mood;Requires cueing      Treatment Provided   Treatment provided  Cognitive-Linquistic      Cognitive-Linquistic Treatment   Treatment focused on  Aphasia    Skilled Treatment  no in person interpreter - AMN used. SLP used picture description to target pt's anomia/dysnomia. Pt with perseveration on previous uterances with variable awareness and semantic paraphasias with little/no awareness of errors. Semantic cues assisted pt today. SLp requested in-person interpreter for ST sessions from this time onward.       Assessment / Recommendations / Plan   Plan  Continue with current plan of care      Progression Toward Goals   Progression toward goals  Progressing toward goals          SLP Short Term Goals - 09/12/19 1144      SLP SHORT TERM GOAL #1   Title  pt will produce sentences describing pictures with MLU = 5.5 (at least 25 utterances) in 2 sessions    Time  1    Period  Weeks    Status  Not Met      SLP SHORT TERM GOAL #2   Title  pt will communicate basic wants and needs functionally (reported by pt and/or family) with family with min questioning cues over 3 sessions    Time  1    Period  Weeks    Status  Achieved      SLP SHORT TERM GOAL #3   Title  pt and/or family will indicate 3 overt s/sx aspiration PNA with modified independence    Time  1    Period  Weeks    Status  On-going      SLP SHORT TERM GOAL #4   Title  pt will follow commands with superlatives or comparatives 80% with occasional min-mod A in 2 sessions    Time  1    Period  Weeks    Status  Not Met       SLP Long Term Goals - 09/25/19 1250      SLP LONG TERM GOAL #1   Title  pt will participate  in simple conversation with MLU=7.0    Time  2    Period  Weeks   or 17 sessions, for all LTGs   Status  On-going      SLP LONG TERM GOAL #2   Title  pt will practice speech and language tasks at home at least 4 days/week at least 6 weeks    Time  2    Period  Weeks    Status  On-going      SLP LONG TERM GOAL #3   Title  pt will demo understanding of 10 minutes simple complex conversation with modified indpendence    Time  2    Period  Weeks    Status  Revised      SLP LONG TERM GOAL #4   Title  pt will demonstrate attention appropriate for efficacious completion of therapy tasks for 40 minutes in 8 sessions    Baseline  09-14-19    Time  2    Period  Weeks    Status  On-going       Plan - 09/25/19 1248    Clinical Impression Statement  Pt is limited [place] speaker (therefore therapy tasks were completed via interpreter) who presents with receptive and expressive aphasia, 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 Pt would cont to benefit from skilled ST targeting  receptive and expressive language and verbal skills.    Speech Therapy Frequency  2x / week    Duration  --   8 weeks or 17 visits   Treatment/Interventions  Aspiration precaution training;Pharyngeal strengthening exercises;Diet toleration management by SLP;Trials of upgraded texture/liquids;Language facilitation;Cueing hierarchy;SLP instruction and feedback;Internal/external aids;Compensatory strategies;Patient/family education;Cognitive reorganization;Functional tasks;Environmental controls;Multimodal communcation approach    Potential to Achieve Goals  Fair    Potential Considerations  Severity of impairments;Cooperation/participation level       Patient will benefit from skilled therapeutic intervention in order to improve the following deficits and impairments:   Aphasia  Verbal apraxia  Cognitive communication deficit    Problem List Patient Active Problem List   Diagnosis Date Noted  . Renal failure 07/25/2019  . Prediabetes 07/25/2019  . Left middle cerebral artery stroke ([place] [facility]) 07/25/2019  . 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 Dysphagia 07/25/2019  . 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 Acute respiratory failure ([place])   . Tachycardia-bradycardia syndrome ([place])   . Atrial fibrillation with RVR ([place])   . Hypertension 07/23/2015  . Hyperlipidemia 07/23/2015  . 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 Hypothyroidism 01/25/2014    Burr [email or web address]  09/25/2019, 12:50 PM  [facility] [address] [place], [place], [address] Phone: [phone]   Fax:  [phone]   Name: Jamie Mitchell MRN: [id number] Date of Birth: 02/01/1946

## 2019-09-25 NOTE — Therapy (Signed)
[facility] [address] [place], [place], [address] Phone: [phone]   Fax:  [phone]  Physical Therapy Treatment  Patient Details  Name: Jamie Mitchell MRN: [id number] Date of Birth: 05-02-1946 Referring Provider (PT): Alysia Penna, MD   Encounter Date: 09/25/2019  PT End of Session - 09/25/19 1259    Visit Number  9    Number of Visits  17    Date for PT Re-Evaluation  10/13/19    Authorization Type  Medicare-10th visit progress note    PT Start Time  1102    PT Stop Time  1145    PT Time Calculation (min)  43 min    Equipment Utilized During Treatment  Gait belt    Activity Tolerance  Patient tolerated treatment well    Behavior During Therapy  [facility] for tasks assessed/performed       Past Medical History:  Diagnosis Date  . Hypertension   . Thyroid disease     Past Surgical History:  Procedure Laterality Date  . IR CT HEAD LTD  07/20/2019  . IR PERCUTANEOUS ART THROMBECTOMY/INFUSION INTRACRANIAL INC DIAG ANGIO  07/20/2019  . RADIOLOGY WITH ANESTHESIA N/A 07/20/2019   Procedure: IR WITH ANESTHESIA;  Surgeon: Luanne Bras, MD;  Location: [facility];  Service: Radiology;  Laterality: N/A;    There were no vitals filed for this visit.  Subjective Assessment - 09/25/19 1114    Subjective  No new complaints. No falls or pain to report.    Patient is accompained by:  Family member;Interpreter    Patient Stated Goals  Per interpretor:  improve speech and walking    Currently in Pain?  No/denies         [facility] PT Assessment - 09/25/19 1128      Ambulation/Gait   Ambulation/Gait  Yes    Ambulation/Gait Assistance  6: Modified independent (Device/Increase time);5: Supervision    Ambulation/Gait Assistance Details  Simulated outdoor surfaces as best as possible due to severe weather outdoors.  Had pt ambulated over varying thickness mats with items underneath as well as stepping over barriers  and around obstacles.  No LOB during session but max cues for task due to cognitive deficits.     Ambulation Distance (Feet)  600 Feet    Assistive device  None    Gait Pattern  Step-through pattern;Narrow base of support    Ambulation Surface  Level;Indoor;Unlevel   simulated outdoors     Functional Gait  Assessment   Gait assessed   Yes    Gait Level Surface  Walks 20 ft in less than 5.5 sec, no assistive devices, good speed, no evidence for imbalance, normal gait pattern, deviates no more than 6 in outside of the 12 in walkway width.   5.43   Change in Gait Speed  Able to smoothly change walking speed without loss of balance or gait deviation. Deviate no more than 6 in outside of the 12 in walkway width.    Gait with Horizontal Head Turns  Performs head turns smoothly with no change in gait. Deviates no more than 6 in outside 12 in walkway width    Gait with Vertical Head Turns  Performs task with slight change in gait velocity (eg, minor disruption to smooth gait path), deviates 6 - 10 in outside 12 in walkway width or uses assistive device    Gait and Pivot Turn  Pivot turns safely within 3 sec and stops quickly with no loss of  balance.    Step Over Obstacle  Is able to step over 2 stacked shoe boxes taped together (9 in total height) without changing gait speed. No evidence of imbalance.    Gait with Narrow Base of Support  Is able to ambulate for 10 steps heel to toe with no staggering.    Gait with Eyes Closed  Walks 20 ft, uses assistive device, slower speed, mild gait deviations, deviates 6-10 in outside 12 in walkway width. Ambulates 20 ft in less than 9 sec but greater than 7 sec.    Ambulating Backwards  Walks 20 ft, uses assistive device, slower speed, mild gait deviations, deviates 6-10 in outside 12 in walkway width.    Steps  Alternating feet, no rail.    Total Score  27    FGA comment:  25-28 = low risk fall                            PT Education -  09/25/19 1259    Education Details  wrapping up in the next 1-2 visits    Person(s) Educated  Patient;Child(ren)    Methods  Explanation    Comprehension  Verbalized understanding       PT Short Term Goals - 09/12/19 1153      PT SHORT TERM GOAL #1   Title  Pt will be independent with initial HEP in order to indiciate improved functional mobility and decreased fall risk.  (Target Date: 09/13/19)    Baseline  09/12/19; met with current program    Status  Achieved    Target Date  09/13/19      PT SHORT TERM GOAL #2   Title  Pt will improve FGA to >/=19/30 in order to indicate decreased fall risk.    Baseline  09/12/19: 23/30 scored today    Status  Achieved      PT SHORT TERM GOAL #3   Title  Pt will improve 5TSS to </=13 secs without UE support in order to indicate improved functional strength.    Baseline  09/12/19: 11.03 without UE support    Status  Achieved      PT SHORT TERM GOAL #4   Title  Pt will improve gait speed to >/=3.71 ft/sec with no overt LOB/sway in order to indicate more normal gait speed.    Baseline  09/12/19: 4.17 ft/sec no AD, no balance issues    Status  Achieved      PT SHORT TERM GOAL #5   Title  Pt will ambulate up/down stairs with single rail in alternating pattern at mod I level in order to indicate safe home entry/exit.    Baseline  09/12/19: met in session today    Status  Achieved        PT Long Term Goals - 09/25/19 1115      PT LONG TERM GOAL #1   Title  Pt will be independent with final HEP (with cues from family as needed) in order to indicate improved functional mobility and decreased fall risk.  (Target Date: 10/13/19)    Time  8    Period  Weeks    Status  On-going      PT LONG TERM GOAL #2   Title  Pt will improve FGA to >/=23/30 in order to indicate decreased fall risk.    Baseline  09/12/19: 23/30 scored today to 27/30 on 5/3    Status  Achieved  PT LONG TERM GOAL #3   Title  Pt will ambulate >1000' over varying outdoor surfaces  at mod I (S from a cognitive standpoint) level while scanning environment in order to indicate safe community mobility.    Time  8    Period  Weeks    Status  On-going      PT LONG TERM GOAL #4   Title  Pt will perform simulated gardening tasks at mod I level in order to return to leisure activities.    Time  8    Period  Weeks    Status  Achieved            Plan - 09/25/19 1301    Clinical Impression Statement  Skilled session focused on beginning to assess LTGs as pt has progressed very well from a mobility standpoint.  She has met FGA goal and simulated gardening goal as she has returned to this at home.  Would like 1-2 more sessions to wrap up and go over/update HEP and check outdoor goal formally.  Pt and daughter in law verbalized understanding.    Personal Factors and Comorbidities  Comorbidity 3+;Age    Comorbidities  see above    Examination-Activity Limitations  Carry;Dressing;Stand;Lift;Stairs;Squat;Physicist, medical Activity;Laundry;Shop;Yard Work    [employer]chant [employer] officer  Evolving/Moderate complexity    Rehab Potential  Excellent    PT Frequency  2x / week    PT Duration  8 weeks    PT Treatment/Interventions  ADLs/Self Care Home Management;Aquatic Therapy;Gait training;Stair training;Functional mobility training;Therapeutic activities;Therapeutic exercise;Balance training;Neuromuscular re-education;Patient/family education;Passive range of motion;Vestibular    PT Next Visit Plan  Raquel Sarna I think you can D/C....I just want to go through her HEP and update it as needed and formally check outdoor goals.  You can send to me for DC.    Consulted and Agree with Plan of Care  Patient;Family member/caregiver    Family Member Consulted  daughter in law via interpreter       Patient will benefit from skilled therapeutic intervention in order to improve the following deficits and impairments:  Abnormal  gait, Decreased activity tolerance, Decreased balance, Decreased cognition, Decreased endurance, Decreased mobility, Decreased strength, Impaired perceived functional ability, Improper body mechanics, Postural dysfunction  Visit Diagnosis: Muscle weakness (generalized)  Other abnormalities of gait and mobility  Unsteadiness on feet     Problem List Patient Active Problem List   Diagnosis Date Noted  . Renal failure 07/25/2019  . Prediabetes 07/25/2019  . Left middle cerebral artery stroke ([facility]) 07/25/2019  . 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 Dysphagia 07/25/2019  . 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 Acute respiratory failure ([place])   . Tachycardia-bradycardia syndrome ([place])   . Atrial fibrillation with RVR ([place])   . Hypertension 07/23/2015  . Hyperlipidemia 07/23/2015  . 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 Hypothyroidism 01/25/2014    Cameron Sprang, PT, MPT [name] [address] [place], [place], [address] Phone: [phone]   Fax:  [phone] 09/25/19, 1:04 PM  Name: Kalei Meda MRN: [id number] Date of Birth: 12-07-1945

## 2019-09-26 ENCOUNTER — Other Ambulatory Visit: Payer: Self-pay | Admitting: *Deleted

## 2019-09-26 ENCOUNTER — Encounter: Payer: Self-pay | Admitting: *Deleted

## 2019-09-26 NOTE — Patient Outreach (Addendum)
[facility]) Care Management  [place]  09/26/2019   Jamie Mitchell [birth year] [id number]  Subjective: Telephone call to patient's designated party release/ son  [name]), spoke with son, stated patient's name, date of birth, and address.   States he remembers speaking with this RNCM in the past and is in agreement to follow up on patient's behalf.  Son states patient is doing pretty good, doing more activities of daily living by herself, and talking more.  States patient assists daughter-in-law with household management.  Patient is attending outpatient physical and speech therapy, therapies going well, home exercise program going well.   Son states patient's occupational therapy has been completed and goals met.  States 09/04/2019 primary MD appointment went well and had clarified that no insulin treatment is needed.  Son states he is planning to follow up with MD to verify if patient should be taking aspirin as he was instructed upon hospital discharge, since she is also on a blood thinner.  Patient states he does not have any transportation or pharmacy needs at this time.  States he is very appreciative of the follow up and is in agreement to continue to receive [facility] services on patient's behalf.  Son states he is in agreement to [facility] sending the following educational material per his request:  Aphasia (EMMI handout).    Objective: Per KPN (Knowledge Performance Now, point of care tool) and chart review,patient hospitalized 07/25/2019 - 08/03/2019 forLeft middle cerebral artery strokerehab. Patient hospitalized 07/19/2019 - 12/23/2749 for Embolic stroke involving left middle cerebral artery, status post tPA,mechanical thrombectomy, due toAtrial fibrillation. Patient also has a history of Hypothyroidism,Hypertension,Prediabetes,Aphasia as late effect of cerebrovascular accident, and Dysphagia.    Encounter Medications:  Outpatient  Encounter Medications as of 09/26/2019  Medication Sig Note  . amiodarone (PACERONE) 200 MG tablet Take 1 tablet (200 mg total) by mouth daily.   [name] apixaban (ELIQUIS) 5 MG TABS tablet Take 1 tablet (5 mg total) by mouth 2 (two) times daily.   [name] atorvastatin (LIPITOR) 40 MG tablet Take 1 tablet (40 mg total) by mouth daily. 08/09/2019: Son states given 80 mg tablet and is cutting it in half.  . diclofenac Sodium (VOLTAREN) 1 % GEL Apply 2 g topically 4 (four) times daily as needed (pain).   [name] levothyroxine (SYNTHROID) 88 MCG tablet Take 1 tablet (88 mcg total) by mouth daily before breakfast.   . acetaminophen (TYLENOL) 325 MG tablet Take 2 tablets (650 mg total) by mouth every 4 (four) hours as needed for mild pain (or temp > 37.5 C (99.5 F)). (Patient not taking: Reported on 09/06/2019)    No facility-administered encounter medications on file as of 09/26/2019.    Functional Status:  In your present state of health, do you have any difficulty performing the following activities: 09/26/2019  Hearing? N  Vision? N  Difficulty concentrating or making decisions? N  Walking or climbing stairs? N  Dressing or bathing? N  Doing errands, shopping? N  Preparing Food and eating ? N  Using the Toilet? N  In the past six months, have you accidently leaked urine? N  Do you have problems with loss of bowel control? N  Managing your Medications? N  Managing your Finances? N  Housekeeping or managing your Housekeeping? N  Some recent data might be hidden    Fall/Depression Screening: Fall Risk  09/26/2019 08/11/2019 08/24/2016  Falls in the past year? 1 0 No  Number falls in  past yr: 1 - -  Injury with Fall? 0 - -  Risk for fall due to : History of fall(s) - -  Follow up Falls evaluation completed - -   PHQ 2/9 Scores 09/06/2019 09/04/2019 02/26/2017 08/24/2016 02/20/2016 02/20/2016 08/21/2015  PHQ - 2 Score 0 0 0 0 0 0 0  PHQ- 9 Score - 0 0 0 0 - -  Exception Documentation - - - - - - Other- indicate reason in  comment box    Assessment: Received [facility] Liaison referral on 08/03/2019. Referral source: [facility] Brood. Referral reason: This was a hospital referral from the nutritionist/patient on [employer] family needed teaching and support -Has home health; [facility] is TOC.  Please assign to [place] Coordinator for complex care and disease management follow up calls, patient is aphasic and speaks/understands Spanish, her son is the contact person and speaks fluent [place], his name is [name], (Son) [phone] and assess for further needs. Patient was in the rehab center.Screening follow up completed,hasreferredto Education officer, museum for [employer] follow up, telephone assessment pending,andRNCM will continue to follow for care management needs.    [facility] CM Care Plan Problem One     Most Recent Value  Care Plan Problem One  Knowledge Deficit related to patient's recent  inpatient hospitalized and inpatient rehab for stroke, dates of service  07/19/2019 - 08/03/2019.  Role Documenting the Problem One  Care Management Telephonic [place] for Problem One  Active  THN Long Term Goal   Patient's son will report no hospitalizations for any reason over the next 31 days.  THN Long Term Goal Start Date  08/09/19  Interventions for Problem One Long Term Goal  RNCM reviewed medications and son will follow up to clarify aspirin.   THN CM Short Term Goal #1   Patient's son will report patient has attended all scheduled provider follow up appointments, over the next 30 days.  THN CM Short Term Goal #1 Start Date  08/09/19  [facility] CM Short Term Goal #1 Met Date  09/26/19  Interventions for Short Term Goal #1  RNCM verified patient has attended all hospital follow up appointments and next upcoming appointment with cardiologist.  [facility] CM Short Term Goal #2   Patient's son will report patient has attended all scheduled outpatient therapy appointments,  over the next 30 days.  THN CM Short Term Goal #2 Start Date  08/09/19  Interventions for Short Term Goal #2  RNCM verified with son patient has completed occupational therapy, still attending physical, and speech therapy.        Plan: RNCM will send primary MD barrier/ involvement letter and route assessment.  RNCM will call patient's son/ designated party release for telephone outreach attempt, within 31 business days, assessment follow up. RNCM will send resource letter to patient's son with the following education material per his request: Aphasia (EMMI handout).       Zenita Kister H. Annia Friendly, BSN, [facility] 2 Telephonic CM Phone: [phone] Fax: [phone]

## 2019-09-27 ENCOUNTER — Ambulatory Visit: Payer: Medicare Other

## 2019-09-27 ENCOUNTER — Other Ambulatory Visit: Payer: Self-pay

## 2019-09-27 ENCOUNTER — Encounter: Payer: Medicare Other | Admitting: Occupational Therapy

## 2019-09-27 DIAGNOSIS — R4701 Aphasia: Secondary | ICD-10-CM

## 2019-09-27 DIAGNOSIS — R2681 Unsteadiness on feet: Secondary | ICD-10-CM | POA: Diagnosis not present

## 2019-09-27 DIAGNOSIS — M6281 Muscle weakness (generalized): Secondary | ICD-10-CM | POA: Diagnosis not present

## 2019-09-27 DIAGNOSIS — R482 Apraxia: Secondary | ICD-10-CM | POA: Diagnosis not present

## 2019-09-27 DIAGNOSIS — R41841 Cognitive communication deficit: Secondary | ICD-10-CM | POA: Diagnosis not present

## 2019-09-27 DIAGNOSIS — R2689 Other abnormalities of gait and mobility: Secondary | ICD-10-CM | POA: Diagnosis not present

## 2019-09-27 NOTE — Therapy (Signed)
[facility] [address] [place], [place], [address] Phone: [phone]   Fax:  [phone]  Speech Language Pathology Treatment  Patient Details  Name: Jamie Mitchell MRN: [id number] Date of Birth: 10/27/1945 Referring Provider (SLP): Alysia Penna, MD   Encounter Date: 09/27/2019  End of Session - 09/27/19 1506    Visit Number  12    Number of Visits  17    Date for SLP Re-Evaluation  11/17/19    SLP Start Time  0934    SLP Stop Time   1015    SLP Time Calculation (min)  41 min    Activity Tolerance  Patient tolerated treatment well       Past Medical History:  Diagnosis Date  . Hypertension   . Thyroid disease     Past Surgical History:  Procedure Laterality Date  . IR CT HEAD LTD  07/20/2019  . IR PERCUTANEOUS ART THROMBECTOMY/INFUSION INTRACRANIAL INC DIAG ANGIO  07/20/2019  . RADIOLOGY WITH ANESTHESIA N/A 07/20/2019   Procedure: IR WITH ANESTHESIA;  Surgeon: Luanne Bras, MD;  Location: [facility];  Service: Radiology;  Laterality: N/A;    There were no vitals filed for this visit.  Subjective Assessment - 09/27/19 1502    Subjective  Pt arrives with DIL.    Patient is accompained by:  Interpreter;Family member    Currently in Pain?  No/denies            ADULT SLP TREATMENT - 09/27/19 1503      General Information   Behavior/Cognition  Alert;Cooperative;Pleasant mood;Requires cueing      Treatment Provided   Treatment provided  Cognitive-Linquistic      Cognitive-Linquistic Treatment   Treatment focused on  Aphasia    Skilled Treatment  Education re: perseveration and why some exclamatory speech unaffected by CVA/aphasia. Pt was engaged in divergent naming tasks today with simple categories (fruit and transportation). Pt req'd cues for what is transportation - cont to repeat "my son picks me up at the airport" when asked to name vehicles. Initially req'd usual max cues faded to  occasional min-mod cues for naming items.       Assessment / Recommendations / Plan   Plan  Continue with current plan of care      Progression Toward Goals   Progression toward goals  Progressing toward goals       SLP Education - 09/27/19 1506    Education Details  perseveration, exclamatory phrases    Person(s) Educated  Patient;Child(ren)    Methods  Explanation    Comprehension  Verbalized understanding;Need further instruction       SLP Short Term Goals - 09/12/19 1144      SLP SHORT TERM GOAL #1   Title  pt will produce sentences describing pictures with MLU = 5.5 (at least 25 utterances) in 2 sessions    Time  1    Period  Weeks    Status  Not Met      SLP SHORT TERM GOAL #2   Title  pt will communicate basic wants and needs functionally (reported by pt and/or family) with family with min questioning cues over 3 sessions    Time  1    Period  Weeks    Status  Achieved      SLP SHORT TERM GOAL #3   Title  pt and/or family will indicate 3 overt s/sx aspiration PNA with modified independence    Time  1  Period  Weeks    Status  On-going      SLP SHORT TERM GOAL #4   Title  pt will follow commands with superlatives or comparatives 80% with occasional min-mod A in 2 sessions    Time  1    Period  Weeks    Status  Not Met       SLP Long Term Goals - 09/27/19 1507      SLP LONG TERM GOAL #1   Title  pt will participate in simple conversation with MLU=7.0    Time  2    Period  Weeks   or 17 sessions, for all LTGs   Status  On-going      SLP LONG TERM GOAL #2   Title  pt will practice speech and language tasks at home at least 4 days/week at least 6 weeks    Time  2    Period  Weeks    Status  On-going      SLP LONG TERM GOAL #3   Title  pt will demo understanding of 10 minutes simple complex conversation with modified indpendence    Time  2    Period  Weeks    Status  Revised      SLP LONG TERM GOAL #4   Title  pt will demonstrate attention  appropriate for efficacious completion of therapy tasks for 40 minutes in 8 sessions    Baseline  09-14-19    Time  2    Period  Weeks    Status  On-going       Plan - 09/27/19 1507    Clinical Impression Statement  Pt is limited [place] speaker (therefore therapy tasks were completed via interpreter) who presents with receptive and expressive aphasia, 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 Pt is making slow but steady progress towards goals. Pt would cont to benefit from skilled ST targeting receptive and expressive language and verbal skills.    Speech Therapy Frequency  2x / week    Duration  --   8 weeks or 17 visits   Treatment/Interventions  Aspiration precaution training;Pharyngeal strengthening exercises;Diet toleration management by SLP;Trials of upgraded texture/liquids;Language facilitation;Cueing hierarchy;SLP instruction and feedback;Internal/external aids;Compensatory strategies;Patient/family education;Cognitive reorganization;Functional tasks;Environmental controls;Multimodal communcation approach    Potential to Achieve Goals  Fair    Potential Considerations  Severity of impairments;Cooperation/participation level       Patient will benefit from skilled therapeutic intervention in order to improve the following deficits and impairments:   Aphasia  Verbal apraxia  Cognitive communication deficit    Problem List Patient Active Problem List   Diagnosis Date Noted  . Renal failure 07/25/2019  . Prediabetes 07/25/2019  . Left middle cerebral artery stroke ([place]) 07/25/2019  . 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 Dysphagia 07/25/2019  . 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 Acute respiratory failure ([place])   . Tachycardia-bradycardia syndrome ([place])   . Atrial fibrillation with RVR ([place])   . Hypertension 07/23/2015  . Hyperlipidemia 07/23/2015  . DJD  (degenerative joint disease) of knee 01/25/2014  . Hypothyroidism 01/25/2014    [facility] ,[place], [place]  09/27/2019, 3:08 PM  [facility] [address] [place], [place], [address] Phone: [phone]   Fax:  [phone]   Name: Kimari Lienhard MRN: [id number] Date of Birth: 01/03/1946

## 2019-09-28 ENCOUNTER — Ambulatory Visit: Payer: Medicare Other

## 2019-09-28 DIAGNOSIS — R4701 Aphasia: Secondary | ICD-10-CM | POA: Diagnosis not present

## 2019-09-28 DIAGNOSIS — M6281 Muscle weakness (generalized): Secondary | ICD-10-CM

## 2019-09-28 DIAGNOSIS — R2689 Other abnormalities of gait and mobility: Secondary | ICD-10-CM

## 2019-09-28 DIAGNOSIS — R2681 Unsteadiness on feet: Secondary | ICD-10-CM | POA: Diagnosis not present

## 2019-09-28 DIAGNOSIS — R41841 Cognitive communication deficit: Secondary | ICD-10-CM | POA: Diagnosis not present

## 2019-09-28 DIAGNOSIS — R482 Apraxia: Secondary | ICD-10-CM | POA: Diagnosis not present

## 2019-09-28 NOTE — Patient Instructions (Signed)
Access Code: [email or web address] URL: [email or web address] Date: 09/28/2019 Prepared by: Cherly Anderson  Exercises Walking March - 1 x daily - 7 x weekly - 1 sets - 4 reps Tandem Walking with Counter Support - 1 x daily - 7 x weekly - 1 sets - 4 reps Standing Hip Abduction with Counter Support - 1 x daily - 7 x weekly - 1 sets - 10 reps Romberg Stance Eyes Closed on Foam Pad - 1 x daily - 7 x weekly - 10 reps - 3 sets Romberg Stance on Foam Pad with Head Rotation - 1 x daily - 7 x weekly - 10 reps - 3 sets Romberg Stance with Head Nods on Foam Pad - 1 x daily - 7 x weekly - 10 reps - 3 sets Sit to Stand without Arm Support - 1 x daily - 7 x weekly - 1 sets - 10 reps

## 2019-09-28 NOTE — Therapy (Addendum)
[facility] [address] [place], [place], [address] Phone: [phone]   Fax:  [phone]  Physical Therapy Treatment/Discharge Summary  Patient Details  Name: Jamie Mitchell MRN: [id number] Date of Birth: August 25, 1945 Referring Provider (PT): Alysia Penna, MD         Encounter Date: 09/28/2019  PT End of Session - 09/28/19 0849    Visit Number  10    Number of Visits  17    Date for PT Re-Evaluation  10/13/19    Authorization Type  Medicare-10th visit progress note    PT Start Time  0846    PT Stop Time  0915   finished early with d/c visit   PT Time Calculation (min)  29 min    Equipment Utilized During Treatment  Gait belt    Activity Tolerance  Patient tolerated treatment well    Behavior During Therapy  [facility] for tasks assessed/performed       Past Medical History:  Diagnosis Date  . Hypertension   . Thyroid disease     Past Surgical History:  Procedure Laterality Date  . IR CT HEAD LTD  07/20/2019  . IR PERCUTANEOUS ART THROMBECTOMY/INFUSION INTRACRANIAL INC DIAG ANGIO  07/20/2019  . RADIOLOGY WITH ANESTHESIA N/A 07/20/2019   Procedure: IR WITH ANESTHESIA;  Surgeon: Luanne Bras, MD;  Location: [facility];  Service: Radiology;  Laterality: N/A;    There were no vitals filed for this visit.  Subjective Assessment - 09/28/19 0849    Subjective  Pt has no new complaints.    Patient is accompained by:  Family member;Interpreter    Patient Stated Goals  Per interpretor:  improve speech and walking    Currently in Pain?  No/denies                       [facility] Adult PT Treatment/Exercise - 09/28/19 0850      Ambulation/Gait   Ambulation/Gait  Yes    Ambulation/Gait Assistance  7: Independent    Ambulation Distance (Feet)  1100 Feet    Assistive device  None    Gait Pattern  Step-through pattern    Ambulation Surface  Level;Unlevel;Indoor;Outdoor;Paved;Grass    Gait  Comments  Pt stepped over curb stop x 2 independently      Neuro Re-ed    Neuro Re-ed Details   PT reviewed HEP with patient. Pt then performed reciprocal steps over 4 cones with tapping first for increased SLS time and then over 4 hurdles x 2 laps each. Side stepping behind cones with squatting to tap each cone to mimic gardening x 2 laps. Pt was cued to bend knees and not just from back to protect back. Standing on rockerboard positioned ant/post with alternating tapping cone x 10 CGA. Maintaining rockerboard level with eyes closed x 30 sec with CGA/min assist 1 time as leaned forward excessively.             PT Education - 09/28/19 0921    Education Details  Pt to continue with current HEP. Plan to proceed with discharge today.    Person(s) Educated  Patient;Child(ren)    Methods  Explanation    Comprehension  Verbalized understanding       PT Short Term Goals - 09/12/19 1153      PT SHORT TERM GOAL #1   Title  Pt will be independent with initial HEP in order to indiciate improved functional mobility and decreased fall risk.  (Target  Date: 09/13/19)    Baseline  09/12/19; met with current program    Status  Achieved    Target Date  09/13/19      PT SHORT TERM GOAL #2   Title  Pt will improve FGA to >/=19/30 in order to indicate decreased fall risk.    Baseline  09/12/19: 23/30 scored today    Status  Achieved      PT SHORT TERM GOAL #3   Title  Pt will improve 5TSS to </=13 secs without UE support in order to indicate improved functional strength.    Baseline  09/12/19: 11.03 without UE support    Status  Achieved      PT SHORT TERM GOAL #4   Title  Pt will improve gait speed to >/=3.71 ft/sec with no overt LOB/sway in order to indicate more normal gait speed.    Baseline  09/12/19: 4.17 ft/sec no AD, no balance issues    Status  Achieved      PT SHORT TERM GOAL #5   Title  Pt will ambulate up/down stairs with single rail in alternating pattern at mod I level in order to  indicate safe home entry/exit.    Baseline  09/12/19: met in session today    Status  Achieved        PT Long Term Goals - 09/28/19 0921      PT LONG TERM GOAL #1   Title  Pt will be independent with final HEP (with cues from family as needed) in order to indicate improved functional mobility and decreased fall risk.  (Target Date: 10/13/19)    Baseline  able to perform HEP safely    Time  8    Period  Weeks    Status  Achieved      PT LONG TERM GOAL #2   Title  Pt will improve FGA to >/=23/30 in order to indicate decreased fall risk.    Baseline  09/12/19: 23/30 scored today to 27/30 on 5/3    Status  Achieved      PT LONG TERM GOAL #3   Title  Pt will ambulate >1000' over varying outdoor surfaces at mod I (S from a cognitive standpoint) level while scanning environment in order to indicate safe community mobility.    Baseline  Pt was able to ambulate outside independently. Daughter goes with her for supervision from cognitive standpoint.    Time  8    Period  Weeks    Status  Achieved      PT LONG TERM GOAL #4   Title  Pt will perform simulated gardening tasks at mod I level in order to return to leisure activities.    Time  8    Period  Weeks    Status  Achieved         PHYSICAL THERAPY DISCHARGE SUMMARY  Visits from Start of Care: 10  Current functional level related to goals / functional outcomes: See goals above   Remaining deficits: Pt with high level balance and strength deficits, has HEP to address.  Also had premorbid gait deviations from knee injury.      Education / Equipment: HEP   Plan: Patient agrees to discharge.  Patient goals were met. Patient is being discharged due to meeting the stated rehab goals.  ?????         D/C summary done by:  Cameron Sprang, PT, MPT [facility] [address] [place], [place], [address]  Phone: [phone]   Fax:  [phone] 09/29/19, 8:43 AM       Plan -  09/28/19 0923    Clinical Impression Statement  Pt has met all goals at this time. She is able to ambulate independently without AD on varied surfaces. Daughter goes with her when they go out for supervision on cognitive standpoint. Pt is able to perform her HEP safely. Pt's balance has improved and she is low fall risk. PT discharging as planned.    Personal Factors and Comorbidities  Comorbidity 3+;Age    Comorbidities  see above    Examination-Activity Limitations  Carry;Dressing;Stand;Lift;Stairs;Squat;Physicist, medical Activity;Laundry;Shop;Yard Work    [employer]chant [employer] officer  Evolving/Moderate complexity    Rehab Potential  Excellent    PT Frequency  2x / week    PT Duration  8 weeks    PT Treatment/Interventions  ADLs/Self Care Home Management;Aquatic Therapy;Gait training;Stair training;Functional mobility training;Therapeutic activities;Therapeutic exercise;Balance training;Neuromuscular re-education;Patient/family education;Passive range of motion;Vestibular    PT Next Visit Plan  Discharged today.    Consulted and Agree with Plan of Care  Patient;Family member/caregiver    Family Member Consulted  daughter in law via interpreter       Patient will benefit from skilled therapeutic intervention in order to improve the following deficits and impairments:  Abnormal gait, Decreased activity tolerance, Decreased balance, Decreased cognition, Decreased endurance, Decreased mobility, Decreased strength, Impaired perceived functional ability, Improper body mechanics, Postural dysfunction  Visit Diagnosis: Other abnormalities of gait and mobility  Muscle weakness (generalized)     Problem List Patient Active Problem List   Diagnosis Date Noted  . Renal failure 07/25/2019  . Prediabetes 07/25/2019  . 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 Aphasia as late effect of cerebrovascular accident  (CVA) 07/25/2019  . Dysphagia 07/25/2019  . 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 Acute respiratory failure ([place])   . Tachycardia-bradycardia syndrome ([place])   . Atrial fibrillation with RVR ([place])   . Hypertension 07/23/2015  . Hyperlipidemia 07/23/2015  . 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 Hypothyroidism 01/25/2014    Electa Sniff, PT, DPT, NCS 09/28/2019, 9:26 AM  [facility] [address] [place], [place], [address] Phone: [phone]   Fax:  [phone]  Name: Kaylamarie Swickard MRN: [id number] Date of Birth: April 25, 1946

## 2019-09-29 ENCOUNTER — Ambulatory Visit (INDEPENDENT_AMBULATORY_CARE_PROVIDER_SITE_OTHER): Payer: Medicare Other | Admitting: Internal Medicine

## 2019-09-29 ENCOUNTER — Other Ambulatory Visit: Payer: Self-pay

## 2019-09-29 ENCOUNTER — Encounter: Payer: Self-pay | Admitting: Internal Medicine

## 2019-09-29 VITALS — BP 126/88 | HR 45 | Ht 62.0 in | Wt 150.8 lb

## 2019-09-29 DIAGNOSIS — I495 Sick sinus syndrome: Secondary | ICD-10-CM | POA: Diagnosis not present

## 2019-09-29 NOTE — Progress Notes (Signed)
HPI Ms. Venegas is referred by Dr. Gardiner Rhyme for evaluation of symptomatic bradycardia. She is a pleasant 74 yo woman with a h/o HTN, PAF, on amiodarone, and dyslipidemia. She wore a cardiac monitor which demonstrated sinus brady into the 20's while awake and pauses of just under 4 seconds. She had very brief NS atrial fib. Also of note is that she had a stroke back in February due to atrial fib and underwent thrombectomy. The patient speaks only spanish and an interpreter is required and present today. No Known Allergies   Current Outpatient Medications  Medication Sig Dispense Refill  . amiodarone (PACERONE) 200 MG tablet Take 1 tablet (200 mg total) by mouth daily. 30 tablet 5  . apixaban (ELIQUIS) 5 MG TABS tablet Take 1 tablet (5 mg total) by mouth 2 (two) times daily. 60 tablet 1  . atorvastatin (LIPITOR) 40 MG tablet Take 1 tablet (40 mg total) by mouth daily. 30 tablet 0  . diclofenac Sodium (VOLTAREN) 1 % GEL Apply 2 g topically 4 (four) times daily as needed (pain). 2 g 0  . levothyroxine (SYNTHROID) 88 MCG tablet Take 1 tablet (88 mcg total) by mouth daily before breakfast. 30 tablet 3   No current facility-administered medications for this visit.     Past Medical History:  Diagnosis Date  . Hypertension   . Thyroid disease     ROS:   All systems reviewed and negative except as noted in the HPI.   Past Surgical History:  Procedure Laterality Date  . IR CT HEAD LTD  07/20/2019  . IR PERCUTANEOUS ART THROMBECTOMY/INFUSION INTRACRANIAL INC DIAG ANGIO  07/20/2019  . RADIOLOGY WITH ANESTHESIA N/A 07/20/2019   Procedure: IR WITH ANESTHESIA;  Surgeon: Luanne Bras, MD;  Location: [facility];  Service: Radiology;  Laterality: N/A;     Family History  Family history unknown: Yes     Social History   Socioeconomic History  . Marital status: Married    Spouse name: Not on file  . Number of children: Not on file  . Years of education: Not on file  . Highest  education level: Not on file  Occupational History  . Not on file  Tobacco Use  . Smoking status: Never Smoker  . Smokeless tobacco: Never Used  Substance and Sexual Activity  . Alcohol use: No  . Drug use: No  . Sexual activity: Not Currently  Other Topics Concern  . Not on file  Social History Narrative  . Not on file   Social Determinants of Health   Financial Resource Strain:   . Difficulty of Paying Living Expenses:   Food Insecurity:   . Worried About Charity fundraiser in the Last Year:   . Arboriculturist in the Last Year:   Transportation Needs: No Transportation Needs  . Lack of Transportation (Medical): No  . Lack of Transportation (Non-Medical): No  Physical Activity:   . Days of Exercise per Week:   . Minutes of Exercise per Session:   Stress:   . Feeling of Stress :   Social Connections:   . Frequency of Communication with Friends and Family:   . Frequency of Social Gatherings with Friends and Family:   . Attends Religious Services:   . Active Member of Clubs or Organizations:   . Attends Archivist Meetings:   [name] Marital Status:   Intimate Partner Violence:   . Fear of Current or Ex-Partner:   . Emotionally  Abused:   [name] Physically Abused:   . Sexually Abused:      BP 126/88   Pulse (!) 45   Ht 5\' 2"  (1.575 m)   Wt 150 lb 12.8 oz (68.4 kg)   SpO2 95%   BMI 27.58 kg/m   Physical Exam:  Well appearing NAD HEENT: Unremarkable Neck:  No JVD, no thyromegally Lymphatics:  No adenopathy Back:  No CVA tenderness Lungs:  Clear with no wheezes HEART:  Regular rate rhythm, no murmurs, no rubs, no clicks Abd:  soft, positive bowel sounds, no organomegally, no rebound, no guarding Ext:  2 plus pulses, no edema, no cyanosis, no clubbing Skin:  No rashes no nodules Neuro:  CN II through XII intact, motor grossly intact  EKG - sinus bradycardia   Assess/Plan: 1. Sinus node dysfunction - she is only mildly symptomatic but has severe brady  during the day with sustained HR's in the 20's and pauses of almost 4 seconds. She is on amiodarone but this is controlling her atrial fib.  2. PAF - she will continue amiodarone as she is well controlled. 3. HTN - her bp is well controlled. No change 4. Stroke - she appears to have had a nice recovery and will remain on Eliquis. We will hold for 2 days before her PPM.  Mikle Bosworth.D.

## 2019-09-29 NOTE — Patient Instructions (Signed)
Medication Instructions:  Your physician recommends that you continue on your current medications as directed. Please refer to the Current Medication list given to you today.  Labwork: None ordered.  Testing/Procedures: None ordered.  Follow-Up:  SEE INSTRUCTION LETTER  Any Other Special Instructions Will Be Listed Below (If Applicable).  If you need a refill on your cardiac medications before your next appointment, please call your pharmacy.    Implantacin de Manufacturing engineer Pacemaker Implantation, Adult La implantacin de un marcapasos es un procedimiento para colocar un marcapasos en el trax. Un marcapasos es una pequea computadora que enva seales elctricas al corazn y lo ayuda a latir normalmente. Un marcapasos tambin almacena informacin sobre el ritmo cardaco. Puede necesitar el implante de un marcapasos si:  Sus latidos cardacos son muy lentos (bradicardia).  Se desmaya (sncope).  Le falta el aire (disnea) debido a los problemas cardacos. El marcapasos se conecta al corazn a travs de un cable. A veces se necesita solo un cable. En otros casos, se [employer] cables. [employer] tipos de marcapasos:  IT consultant. Este tipo de [employer] se coloca debajo de la piel o el msculo del trax. El cable pasa por una vena hasta la zona del trax para llegar al interior del corazn.  Marcapasos epicrdico. Este tipo de marcapasos se coloca debajo de la piel o el msculo del trax o el abdomen. El cable atraviesa el trax [employer] exterior del corazn. Informe al mdico acerca de lo siguiente:  Cualquier alergia que tenga.  Todos los [employer]ell [employer], incluidos vitaminas, hierbas, gotas oftlmicas, cremas y medicamentos de venta libre.  Cualquier problema que usted o sus familiares hayan tenido con anestsicos.  Enfermedades de la sangre o los huesos que tenga.  Cirugas a las que se someti.  Cualquier afeccin mdica que  tenga.  Si est embarazada o podra estarlo. Cules son los riesgos? En general, se trata de un procedimiento seguro. Sin embargo, pueden ocurrir complicaciones, por ejemplo:  Infeccin.  Hemorragia.  Falla en el marcapasos o el cable.  Colapso de un pulmn o hemorragia en un pulmn.  Cogulo de sangre en un vaso sanguneo con un cable.  Lesin en el corazn.  Infeccin en el corazn (endocarditis).  Reacciones alrgicas a los medicamentos. Qu ocurre antes del procedimiento? Mantenerse hidratado Siga las indicaciones del mdico acerca de mantenerse hidratado, las cuales pueden incluir lo siguiente:  Hasta 2horas antes del procedimiento, puede beber lquidos transparentes, como agua, jugos frutales transparentes, caf negro y t solo. Restricciones en las comidas y bebidas Siga las indicaciones del mdico respecto de las comidas y bebidas, las cuales pueden incluir lo siguiente:  Ocho horas antes del procedimiento, deje de ingerir comidas o alimentos pesados, por ejemplo, carne, alimentos fritos o alimentos grasos.  Seis horas antes del procedimiento, deje de ingerir comidas o alimentos livianos, como tostadas o cereales.  Seis horas antes del procedimiento, deje de beber [place] o bebidas que [employer].  Dos horas antes del procedimiento, deje de beber lquidos transparentes. Medicamentos  Consulte al mdico sobre: ? Quarry manager o suspender los medicamentos que toma habitualmente. Esto es muy importante si toma medicamentos para la diabetes o anticoagulantes. ? Tomar medicamentos como aspirina e ibuprofeno. Estos medicamentos pueden tener un efecto anticoagulante en la [place]. No tome estos medicamentos antes del procedimiento si su mdico le indica que no lo haga.  Pueden indicarle un antibitico para ayudar a prevenir infecciones. Instrucciones generales  Le harn una evaluacin cardaca. Esto  puede incluir un electrocardiograma (ECG), radiografa de trax y  estudios de diagnstico por imgenes del corazn (ecocardiograma o eco).  Le harn anlisis de [place].  No consuma ningn producto que contenga nicotina o tabaco, como cigarrillos y Psychologist, sport and exercise. Si necesita ayuda para dejar de fumar, consulte al mdico.  Haga que alguien lo lleve a su casa desde el hospital o la clnica.  Si se ir a su casa inmediatamente despus del procedimiento, planifique que alguien se quede con usted durante 24horas.  Pregntele al mdico cmo se Scientist, clinical (histocompatibility and immunogenetics) o se Museum/gallery curator de la Leisure centre manager. Qu ocurre durante el procedimiento?  Para disminuir el riesgo de contraer una infeccin: ? El equipo mdico se lavar o se Transport planner. ? Le lavarn la piel con jabn. ? Pueden rasurarle la zona [place].  Le colocarn un tubo (catter) intravenoso en una de las venas.  Le administrarn uno o ms de los siguientes medicamentos: ? Un medicamento para ayudarlo a relajarse (sedante). ? Un medicamento para adormecer la zona (anestesia local). ? Un medicamento que lo har dormir (anestesia general).  Si estn por colocarle un marcapasos intravenoso: ? Le harn una incisin en la parte superior del trax. ? Se har un bolsillo para el marcapasos. Se puede colocar debajo de la piel o entre las capas del msculo. ? El cable se introducir en un vaso sanguneo que vuelve al corazn. ? Mientras una mquina de diagnstico por imgenes toma radiografas (fluoroscopias), se pasar el cable a travs de la vena hacia el interior del corazn. ? El otro extremo del cable se tunelizar debajo de la piel y se Clinical cytogeneticist al [employer].  Si le colocan un marcapasos epicrdico: ? Le realizarn una incisin cerca de las costillas o del esternn para el cable. ? El cable se conectar a la parte externa del corazn. ? Se realizar otra incisin en el trax o en la parte superior del abdomen para crear un bolsillo para el marcapasos. ? El extremo libre del cable  se tunelizar debajo de la piel y se Clinical cytogeneticist al N[employer].  Se probar el marcapasos intravenoso o epicrdico. Se pueden realizar estudios de diagnstico por imgenes para verificar la posicin del cable.  La incisin se cerrar con puntos (suturas), tiras Raelyn Number o goma para cerrar la piel.  Se colocar un vendaje (apsito) sobre las incisiones. Este procedimiento puede variar segn el mdico y el hospital. Sander Nephew ocurre despus del procedimiento?  Le controlarn la presin arterial, la frecuencia cardaca, la frecuencia respiratoria y Retail buyer de oxgeno en la sangre hasta que desaparezca el efecto de los medicamentos administrados.  Le administrarn antibiticos y analgsicos.  Le realizarn radiografas de trax y electrocardiogramas (ECG).  Usar un tipo de monitor de ECG continuo (monitor Holter) para controlar el ritmo cardaco.  El mdico Immunologist.  No conduzca durante 24horas si le administraron un sedante. Esta informacin no tiene Marine scientist el consejo del mdico. Asegrese de hacerle al mdico cualquier pregunta que tenga. Document Revised: 06/19/2017 Document Reviewed: 10/23/2015 Elsevier Patient Education  2020 [employer].

## 2019-10-02 ENCOUNTER — Encounter: Payer: Medicare Other | Admitting: Occupational Therapy

## 2019-10-02 ENCOUNTER — Other Ambulatory Visit: Payer: Self-pay

## 2019-10-02 ENCOUNTER — Ambulatory Visit: Payer: Medicare Other | Admitting: Rehabilitation

## 2019-10-02 ENCOUNTER — Telehealth: Payer: Self-pay | Admitting: *Deleted

## 2019-10-02 ENCOUNTER — Ambulatory Visit: Payer: Medicare Other

## 2019-10-02 ENCOUNTER — Other Ambulatory Visit: Payer: Self-pay | Admitting: Adult Health

## 2019-10-02 DIAGNOSIS — R482 Apraxia: Secondary | ICD-10-CM

## 2019-10-02 DIAGNOSIS — M6281 Muscle weakness (generalized): Secondary | ICD-10-CM | POA: Diagnosis not present

## 2019-10-02 DIAGNOSIS — R4701 Aphasia: Secondary | ICD-10-CM

## 2019-10-02 DIAGNOSIS — R41841 Cognitive communication deficit: Secondary | ICD-10-CM | POA: Diagnosis not present

## 2019-10-02 DIAGNOSIS — R2681 Unsteadiness on feet: Secondary | ICD-10-CM | POA: Diagnosis not present

## 2019-10-02 DIAGNOSIS — R2689 Other abnormalities of gait and mobility: Secondary | ICD-10-CM | POA: Diagnosis not present

## 2019-10-02 MED ORDER — APIXABAN 5 MG PO TABS: 5.0000 mg | ORAL_TABLET | Freq: Two times a day (BID) | ORAL | 3 refills | Status: DC

## 2019-10-02 MED ORDER — APIXABAN 5 MG PO TABS: 5.0000 mg | ORAL_TABLET | Freq: Two times a day (BID) | ORAL | 0 refills | Status: DC

## 2019-10-02 NOTE — Addendum Note (Signed)
Addended by: Brandon Melnick on: 10/02/2019 04:23 PM   Modules accepted: Orders

## 2019-10-02 NOTE — Therapy (Signed)
[facility] [address] [place], [place], [address] Phone: [phone]   Fax:  [phone]  Speech Language Pathology Treatment  Patient Details  Name: Jamie Mitchell MRN: [id number] Date of Birth: 08-27-1945 Referring Provider (SLP): Alysia Penna, MD   Encounter Date: 10/02/2019  End of Session - 10/02/19 1312    Visit Number  13    Number of Visits  17    Date for SLP Re-Evaluation  11/17/19    SLP Start Time  1148    SLP Stop Time   1230    SLP Time Calculation (min)  42 min    Activity Tolerance  Patient tolerated treatment well       Past Medical History:  Diagnosis Date  . Hypertension   . Thyroid disease     Past Surgical History:  Procedure Laterality Date  . IR CT HEAD LTD  07/20/2019  . IR PERCUTANEOUS ART THROMBECTOMY/INFUSION INTRACRANIAL INC DIAG ANGIO  07/20/2019  . RADIOLOGY WITH ANESTHESIA N/A 07/20/2019   Procedure: IR WITH ANESTHESIA;  Surgeon: Luanne Bras, MD;  Location: [facility];  Service: Radiology;  Laterality: N/A;    There were no vitals filed for this visit.  Subjective Assessment - 10/02/19 [place] arrives with pt.    Patient is accompained by:  Family member   [name]   Currently in Pain?  No/denies    Pain Score  3     Pain Location  Throat            ADULT SLP TREATMENT - 10/02/19 1159      General Information   Behavior/Cognition  Alert;Cooperative;Pleasant mood;Requires cueing      Treatment Provided   Treatment provided  Cognitive-Linquistic      Cognitive-Linquistic Treatment   Treatment focused on  Aphasia    Skilled Treatment  Pt sat down and started telling SLP about her sore throat and frequency of the pain when son stopped her and told her SLP was not the doctor. SLP worked with pt generating simple divergent naming for vegetables and drinks. Pt thought of average 4 items prior to requing cues. Pt and son then tlaking about  potato soup, which pt made and she req'd max cues after 3 items she put into the soup. SLP shared with son that simple naming tasks such like were completed today would be helpful for pt.       Assessment / Recommendations / Plan   Plan  Continue with current plan of care      Progression Toward Goals   Progression toward goals  Progressing toward goals       SLP Education - 10/02/19 1311    Education Details  simple naming taks good for homework    Person(s) Educated  Patient;Child(ren)    Methods  Explanation;Demonstration    Comprehension  Verbalized understanding;Returned demonstration;Need further instruction       SLP Short Term Goals - 09/12/19 1144      SLP SHORT TERM GOAL #1   Title  pt will produce sentences describing pictures with MLU = 5.5 (at least 25 utterances) in 2 sessions    Time  1    Period  Weeks    Status  Not Met      SLP SHORT TERM GOAL #2   Title  pt will communicate basic wants and needs functionally (reported by pt and/or family) with family with min questioning cues over 3 sessions  Time  1    Period  Weeks    Status  Achieved      SLP SHORT TERM GOAL #3   Title  pt and/or family will indicate 3 overt s/sx aspiration PNA with modified independence    Time  1    Period  Weeks    Status  On-going      SLP SHORT TERM GOAL #4   Title  pt will follow commands with superlatives or comparatives 80% with occasional min-mod A in 2 sessions    Time  1    Period  Weeks    Status  Not Met       SLP Long Term Goals - 10/02/19 1313      SLP LONG TERM GOAL #1   Title  pt will participate in simple conversation with MLU=7.0    Time  1    Period  Weeks   or 17 sessions, for all LTGs   Status  On-going      SLP LONG TERM GOAL #2   Title  pt will practice speech and language tasks at home at least 4 days/week at least 6 weeks    Time  1    Period  Weeks    Status  On-going      SLP LONG TERM GOAL #3   Title  pt will demo understanding of 10  minutes simple complex conversation with modified indpendence    Time  1    Period  Weeks    Status  Revised      SLP LONG TERM GOAL #4   Title  pt will demonstrate attention appropriate for efficacious completion of therapy tasks for 40 minutes in 8 sessions    Baseline  09-14-19    Time  1    Period  Weeks    Status  On-going       Plan - 10/02/19 1312    Clinical Impression Statement  Pt is limited [place] speaker (therefore son interpreted today- pt voiced her consent for this) who presents with receptive and expressive aphasia, 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 Pt is making slow but steady progress towards goals. Pt would cont to benefit from skilled ST targeting receptive and expressive language and verbal skills.    Speech Therapy Frequency  2x / week    Duration  --   8 weeks or 17 visits   Treatment/Interventions  Aspiration precaution training;Pharyngeal strengthening exercises;Diet toleration management by SLP;Trials of upgraded texture/liquids;Language facilitation;Cueing hierarchy;SLP instruction and feedback;Internal/external aids;Compensatory strategies;Patient/family education;Cognitive reorganization;Functional tasks;Environmental controls;Multimodal communcation approach    Potential to Achieve Goals  Fair    Potential Considerations  Severity of impairments;Cooperation/participation level       Patient will benefit from skilled therapeutic intervention in order to improve the following deficits and impairments:   Aphasia  Verbal apraxia  Cognitive communication deficit    Problem List Patient Active Problem List   Diagnosis Date Noted  . Renal failure 07/25/2019  . Prediabetes 07/25/2019  . Left middle cerebral artery stroke ([place]) 07/25/2019  . 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 Dysphagia 07/25/2019  . 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 Acute respiratory failure ([place])   . Tachycardia-bradycardia syndrome ([place])   . Atrial fibrillation with RVR ([place])   . Hypertension 07/23/2015  . Hyperlipidemia 07/23/2015  . 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 Hypothyroidism 01/25/2014  [facility] ,[place], [place]  10/02/2019, 1:13 PM  [facility] [address] [place], [place], [address] Phone: [phone]   Fax:  [phone]   Name: Kadey Mihalic MRN: [id number] Date of Birth: [birth year]

## 2019-10-02 NOTE — Telephone Encounter (Signed)
[name](Valdez,[name]) states he was told that Janett Billow, NP would be taking over pt's Rx  For apixaban (ELIQUIS) 5 MG TABS tablet to [facility].  Pt son states pt is down to 1 pill.  Please call

## 2019-10-02 NOTE — Telephone Encounter (Signed)
Mr Jamie Mitchell called and reports that Mrs Jamie Mitchell is out of her eliquis and needs a refill. I have spoken with him and asked that he call [facility] for the management of  The eliquis.  I have given them the number for [facility].

## 2019-10-02 NOTE — Telephone Encounter (Signed)
Spoke to son of pt.  He has calle around trying to get eiliquis 5 mg po bid for pt.  I relayed that this medication per JM.NP will be comeing from [facility], I gave them name and # to call.I relayed that will fill for 30 day and she needs to get from cardiology after this. He verbalized understanding.

## 2019-10-03 ENCOUNTER — Ambulatory Visit: Payer: Medicare Other | Attending: Critical Care Medicine | Admitting: Physician Assistant

## 2019-10-03 DIAGNOSIS — R059 Cough, unspecified: Secondary | ICD-10-CM

## 2019-10-03 DIAGNOSIS — R070 Pain in throat: Secondary | ICD-10-CM

## 2019-10-03 DIAGNOSIS — R05 Cough: Secondary | ICD-10-CM

## 2019-10-03 NOTE — Progress Notes (Signed)
Established Patient Office Visit  Subjective:  Patient ID: Jamie Mitchell, female    DOB: Apr 17, 1946  Age: 74 y.o. MRN: [id number]  CC:  Chief Complaint  Patient presents with  . Sore Throat      Virtual Visit via Telephone Note  I connected with Jamie Mitchell on 10/03/19 at 11:10 AM EDT by telephone and verified that I am speaking with the correct person using two identifiers.  Location: Patient: [facility]   I discussed the limitations, risks, security and privacy concerns of performing an evaluation and management service by telephone and the availability of in person appointments. I also discussed with the patient that there may be a patient responsible charge related to this service. The patient expressed understanding and agreed to proceed.   History of Present Illness: Daughter in law is present and provides history, patient unable to speak due to stroke. Due to language barrier, an interpreter was present during the history-taking and subsequent discussion with this patient.  States that she has been having a sore throat for the past 9 days and cough with clear sputum for the past 4 days.  Denies fever,SOB,change in smell or taste, no sick contacts.  Has not tried anything for relief.  Eating and drinking okay. Can communicate with a few words  Has been out on porch, no history of allergies   Has been vaccinated against covid-19 march 24 and April 21st  Reports that she had an increase in her thyroid medication, daughter-in-law does believe that she had a sore throat prior to the increase.     Observations/Objective: Past medical history, medical problems and medication list reviewed. No physical exam completed   Past Medical History:  Diagnosis Date  . Hypertension   . Thyroid disease     Past Surgical History:  Procedure Laterality Date  . IR CT HEAD LTD  07/20/2019  . IR PERCUTANEOUS ART  THROMBECTOMY/INFUSION INTRACRANIAL INC DIAG ANGIO  07/20/2019  . RADIOLOGY WITH ANESTHESIA N/A 07/20/2019   Procedure: IR WITH ANESTHESIA;  Surgeon: Luanne Bras, MD;  Location: [facility];  Service: Radiology;  Laterality: N/A;    Family History  Family history unknown: Yes    Social History   Socioeconomic History  . Marital status: Married    Spouse name: Not on file  . Number of children: Not on file  . Years of education: Not on file  . Highest education level: Not on file  Occupational History  . Not on file  Tobacco Use  . Smoking status: Never Smoker  . Smokeless tobacco: Never Used  Substance and Sexual Activity  . Alcohol use: No  . Drug use: No  . Sexual activity: Not Currently  Other Topics Concern  . Not on file  Social History Narrative  . Not on file   Social Determinants of Health   Financial Resource Strain:   . Difficulty of Paying Living Expenses:   Food Insecurity:   . Worried About Charity fundraiser in the Last Year:   . Arboriculturist in the Last Year:   Transportation Needs: No Transportation Needs  . Lack of Transportation (Medical): No  . Lack of Transportation (Non-Medical): No  Physical Activity:   . Days of Exercise per Week:   . Minutes of Exercise per Session:   Stress:   . Feeling of Stress :   Social Connections:   . Frequency of Communication with Friends and Family:   .  Frequency of Social Gatherings with Friends and Family:   . Attends Religious Services:   . Active Member of Clubs or Organizations:   . Attends Archivist Meetings:   [name] Marital Status:   Intimate Partner Violence:   . Fear of Current or Ex-Partner:   . Emotionally Abused:   [name] Physically Abused:   . Sexually Abused:     Outpatient Medications Prior to Visit  Medication Sig Dispense Refill  . amiodarone (PACERONE) 200 MG tablet Take 1 tablet (200 mg total) by mouth daily. 30 tablet 5  . apixaban (ELIQUIS) 5 MG TABS tablet Take 1 tablet (5 mg  total) by mouth 2 (two) times daily. 90 tablet 3  . atorvastatin (LIPITOR) 40 MG tablet Take 1 tablet (40 mg total) by mouth daily. 30 tablet 0  . diclofenac Sodium (VOLTAREN) 1 % GEL Apply 2 g topically 4 (four) times daily as needed (pain). 2 g 0  . levothyroxine (SYNTHROID) 88 MCG tablet Take 1 tablet (88 mcg total) by mouth daily before breakfast. 30 tablet 3   No facility-administered medications prior to visit.    No Known Allergies  ROS Review of Systems  Constitutional: Negative for appetite change, chills, fatigue and fever.  HENT: Positive for congestion, sore throat and voice change. Negative for ear pain and sinus pain.   Eyes: Negative.   Respiratory: Positive for cough.   Gastrointestinal: Negative.   Endocrine: Negative.   Genitourinary: Negative.   Musculoskeletal: Negative.   Skin: Negative.   Allergic/Immunologic: Negative.   Neurological: Positive for speech difficulty.  Hematological: Negative.   Psychiatric/Behavioral: Negative.       Objective:     There were no vitals taken for this visit. Wt Readings from Last 3 Encounters:  09/29/19 150 lb 12.8 oz (68.4 kg)  09/06/19 152 lb 9.6 oz (69.2 kg)  09/04/19 151 lb (68.5 kg)     Health Maintenance Due  Topic Date Due  . COVID-19 Vaccine (1) Never done  . TETANUS/TDAP  Never done  . MAMMOGRAM  Never done  . COLONOSCOPY  Never done  . DEXA SCAN  Never done    There are no preventive care reminders to display for this patient.  Lab Results  Component Value Date   TSH 8.960 (H) 09/04/2019   Lab Results  Component Value Date   WBC 7.3 07/26/2019   HGB 14.8 07/26/2019   HCT 44.6 07/26/2019   MCV 88.8 07/26/2019   PLT 226 07/26/2019   Lab Results  Component Value Date   NA 137 07/28/2019   K 3.9 07/28/2019   CO2 21 (L) 07/28/2019   GLUCOSE 166 (H) 07/28/2019   BUN 23 07/28/2019   CREATININE 1.05 (H) 07/28/2019   BILITOT 0.8 07/26/2019   ALKPHOS 85 07/26/2019   AST 35 07/26/2019   ALT  38 07/26/2019   PROT 7.0 07/26/2019   ALBUMIN 3.3 (L) 07/26/2019   CALCIUM 8.5 (L) 07/28/2019   ANIONGAP 11 07/28/2019   Lab Results  Component Value Date   CHOL 113 07/20/2019   Lab Results  Component Value Date   HDL 36 (L) 07/20/2019   Lab Results  Component Value Date   LDLCALC 66 07/20/2019   Lab Results  Component Value Date   TRIG 57 07/20/2019   Lab Results  Component Value Date   CHOLHDL 3.1 07/20/2019   Lab Results  Component Value Date   HGBA1C 6.1 (H) 07/20/2019   HGBA1C 6.0 (H) 07/20/2019  Assessment & Plan:   Problem List Items Addressed This Visit    None    Visit Diagnoses    Throat pain in adult    -  Primary   Cough          Assessment and Plan: 1. Throat pain in adult Patient is fully vaccinated from coronavirus, highly unlikely this is Covid 19, patient is encouraged to report to mobile unit tomorrow for physical exam. Patient does suffer from dysphagia. Encouraged Chloraseptic and Tylenol over-the-counter continue hydration and rest. Daughter-in-law understands and agrees  2. Cough  No after visit summary completed, patient is not on my chart  Follow Up Instructions:    I discussed the assessment and treatment plan with the patient. The patient was provided an opportunity to ask questions and all were answered. The patient agreed with the plan and demonstrated an understanding of the instructions.   The patient was advised to call back or seek an in-person evaluation if the symptoms worsen or if the condition fails to improve as anticipated.  I provided 30 minutes of non-face-to-face time during this encounter.   Natascha Edmonds S Mayers, PA-C   No orders of the defined types were placed in this encounter.   Follow-up: Return in about 1 day (around 10/04/2019) for [facility] mobile medicine unit at [facility].    Loraine Grip Mayers, PA-C

## 2019-10-03 NOTE — Progress Notes (Signed)
Patient verified DOB Patient complains of throat pain beginning 9 days ago. Patient also has a productive clear cough present. Patient states the sore throat presented after her thyroid medication was changed.

## 2019-10-04 ENCOUNTER — Ambulatory Visit: Payer: Medicare Other | Admitting: Physician Assistant

## 2019-10-04 VITALS — BP 135/74 | HR 74 | Temp 96.3°F | Resp 18 | Ht 59.0 in

## 2019-10-04 DIAGNOSIS — J029 Acute pharyngitis, unspecified: Secondary | ICD-10-CM

## 2019-10-04 DIAGNOSIS — I639 Cerebral infarction, unspecified: Secondary | ICD-10-CM

## 2019-10-04 DIAGNOSIS — R059 Cough, unspecified: Secondary | ICD-10-CM

## 2019-10-04 NOTE — Progress Notes (Signed)
Established Patient Office Visit  Subjective:  Patient ID: Jamie Mitchell, female    DOB: [birth year]  Age: 74 y.o. MRN: [id number]  CC:  Chief Complaint  Patient presents with  . Sore Throat    HPI Jamie Mitchell was seen as a telemedicine visit yesterday due to complaint of sore throat and cough with clear sputum.  Son who is a is present and provides history due to patients inability to communicate due to history of stroke.  Was encouraged to try Chloraseptic and Tylenol and follow-up in mobile unit for physical exam.  States that Chloraseptic gave her relief for a short amount of time.  Does state that throat pain is worse at night, has raspy voice later in the evening. Does endorse nonmodified diet.  Due to language barrier, an interpreter was present during the history-taking and subsequent discussion (and for part of the physical exam) with this patient.      Past Medical History:  Diagnosis Date  . Hypertension   . Thyroid disease     Past Surgical History:  Procedure Laterality Date  . IR CT HEAD LTD  07/20/2019  . IR PERCUTANEOUS ART THROMBECTOMY/INFUSION INTRACRANIAL INC DIAG ANGIO  07/20/2019  . RADIOLOGY WITH ANESTHESIA N/A 07/20/2019   Procedure: IR WITH ANESTHESIA;  Surgeon: Luanne Bras, MD;  Location: [facility];  Service: Radiology;  Laterality: N/A;    Family History  Family history unknown: Yes    Social History   Socioeconomic History  . Marital status: Married    Spouse name: Not on file  . Number of children: Not on file  . Years of education: Not on file  . Highest education level: Not on file  Occupational History  . Not on file  Tobacco Use  . Smoking status: Never Smoker  . Smokeless tobacco: Never Used  Substance and Sexual Activity  . Alcohol use: No  . Drug use: No  . Sexual activity: Not Currently  Other Topics Concern  . Not on file  Social History Narrative  . Not on file   Social Determinants of  Health   Financial Resource Strain:   . Difficulty of Paying Living Expenses:   Food Insecurity:   . Worried About Charity fundraiser in the Last Year:   . Arboriculturist in the Last Year:   Transportation Needs: No Transportation Needs  . Lack of Transportation (Medical): No  . Lack of Transportation (Non-Medical): No  Physical Activity:   . Days of Exercise per Week:   . Minutes of Exercise per Session:   Stress:   . Feeling of Stress :   Social Connections:   . Frequency of Communication with Friends and Family:   . Frequency of Social Gatherings with Friends and Family:   . Attends Religious Services:   . Active Member of Clubs or Organizations:   . Attends Archivist Meetings:   [name] Marital Status:   Intimate Partner Violence:   . Fear of Current or Ex-Partner:   . Emotionally Abused:   [name] Physically Abused:   . Sexually Abused:     Outpatient Medications Prior to Visit  Medication Sig Dispense Refill  . amiodarone (PACERONE) 200 MG tablet Take 1 tablet (200 mg total) by mouth daily. 30 tablet 5  . apixaban (ELIQUIS) 5 MG TABS tablet Take 1 tablet (5 mg total) by mouth 2 (two) times daily. 90 tablet 3  . atorvastatin (LIPITOR) 40 MG tablet Take 1 tablet (  40 mg total) by mouth daily. 30 tablet 0  . diclofenac Sodium (VOLTAREN) 1 % GEL Apply 2 g topically 4 (four) times daily as needed (pain). 2 g 0  . levothyroxine (SYNTHROID) 88 MCG tablet Take 1 tablet (88 mcg total) by mouth daily before breakfast. 30 tablet 3   No facility-administered medications prior to visit.    No Known Allergies  ROS Review of Systems  Constitutional: Negative for activity change, fatigue and fever.  HENT: Positive for sore throat and voice change. Negative for congestion.   Eyes: Negative.   Respiratory: Positive for cough.   Cardiovascular: Negative.   Gastrointestinal: Negative.   Endocrine: Negative.   Genitourinary: Negative.   Musculoskeletal: Negative.   Skin:  Negative.   Allergic/Immunologic: Negative.   Neurological: Negative.   Hematological: Negative.   Psychiatric/Behavioral: Negative.       Objective:    Physical Exam  Constitutional: She appears well-developed and well-nourished. No distress.  HENT:  Head: Normocephalic and atraumatic.  Right Ear: External ear normal.  Left Ear: External ear normal.  Nose: Nose normal.  Mouth/Throat: Oropharynx is clear and moist. No oropharyngeal exudate, posterior oropharyngeal edema, posterior oropharyngeal erythema or tonsillar abscesses.    Eyes: Pupils are equal, round, and reactive to light. Conjunctivae and EOM are normal.  Cardiovascular: Normal rate and regular rhythm.  Pulmonary/Chest: Effort normal and breath sounds normal. She has no wheezes. She has no rales.  Abdominal: Soft. Bowel sounds are normal.  Musculoskeletal:        General: Normal range of motion.     Cervical back: Normal range of motion and neck supple.  Lymphadenopathy:    She has no cervical adenopathy.  Neurological: She is alert.  Skin: Skin is warm. She is diaphoretic.  Psychiatric: She has a normal mood and affect. Her behavior is normal. Judgment and thought content normal.  Nursing note and vitals reviewed.   BP 135/74 (BP Location: Left Arm, Patient Position: Standing, Cuff Size: Large)   Pulse 74   Temp (!) 96.3 F (35.7 C) (Oral)   Resp 18   Ht 4\' 11"  (1.499 m)   SpO2 97%   BMI 30.46 kg/m  Wt Readings from Last 3 Encounters:  09/29/19 150 lb 12.8 oz (68.4 kg)  09/06/19 152 lb 9.6 oz (69.2 kg)  09/04/19 151 lb (68.5 kg)     Health Maintenance Due  Topic Date Due  . COVID-19 Vaccine (1) Never done  . TETANUS/TDAP  Never done  . MAMMOGRAM  Never done  . COLONOSCOPY  Never done  . DEXA SCAN  Never done    There are no preventive care reminders to display for this patient.  Lab Results  Component Value Date   TSH 8.960 (H) 09/04/2019   Lab Results  Component Value Date   WBC 7.3  07/26/2019   HGB 14.8 07/26/2019   HCT 44.6 07/26/2019   MCV 88.8 07/26/2019   PLT 226 07/26/2019   Lab Results  Component Value Date   NA 137 07/28/2019   K 3.9 07/28/2019   CO2 21 (L) 07/28/2019   GLUCOSE 166 (H) 07/28/2019   BUN 23 07/28/2019   CREATININE 1.05 (H) 07/28/2019   BILITOT 0.8 07/26/2019   ALKPHOS 85 07/26/2019   AST 35 07/26/2019   ALT 38 07/26/2019   PROT 7.0 07/26/2019   ALBUMIN 3.3 (L) 07/26/2019   CALCIUM 8.5 (L) 07/28/2019   ANIONGAP 11 07/28/2019   Lab Results  Component Value Date  CHOL 113 07/20/2019   Lab Results  Component Value Date   HDL 36 (L) 07/20/2019   Lab Results  Component Value Date   LDLCALC 66 07/20/2019   Lab Results  Component Value Date   TRIG 57 07/20/2019   Lab Results  Component Value Date   CHOLHDL 3.1 07/20/2019   Lab Results  Component Value Date   HGBA1C 6.1 (H) 07/20/2019   HGBA1C 6.0 (H) 07/20/2019      Assessment & Plan:   Problem List Items Addressed This Visit    None     1. Sore throat Patient is fully vaccinated against COVID-19, strep testing was negative, does not have any other URI symptoms.  Patient has had same complaint according to previous medical notes for at least the past 6 weeks.  Patient was previously encouraged to be cautious with consistency of food to avoid aspiration, once again encouraged caution, trial of omeprazole over-the-counter, trial Zyrtec over-the-counter.  Encouraged follow-up with speech therapy for further evaluation.  2. Cough    I have reviewed the patient's medical history (PMH, PSH, Social History, Family History, Medications, and allergies) , and have been updated if relevant. I spent 23 minutes reviewing chart and  face to face time with patient.    No orders of the defined types were placed in this encounter.   Follow-up: Return if symptoms worsen or fail to improve.    Loraine Grip Mayers, PA-C

## 2019-10-04 NOTE — Patient Instructions (Addendum)
I recommend that you try Prilosec over-the-counter, omeprazole is the generic name for the sore throat.  I also encouraged using Claritin or Zyrtec over-the-counter, generic brand is fine.  If the sore throat continues, I would encourage you to follow-up with speech therapy for further evaluation, giving consideration to difficulty swallowing different textures causing irritation in the throat.  I hope that you feel better soon, please let us know if there is anything else we can do for you.  Kennieth Rad, PA-C Physician Assistant [facility] [email or web address]  Sore Throat When you have a sore throat, your throat may feel:  Tender.  Burning.  Irritated.  Scratchy.  Painful when you swallow.  Painful when you talk. Many things can cause a sore throat, such as:  An infection.  Allergies.  Dry air.  Smoke or pollution.  Radiation treatment.  Gastroesophageal reflux disease (GERD).  A tumor. A sore throat can be the first sign of another sickness. It can happen with other problems, like:  Coughing.  Sneezing.  Fever.  Swelling in the neck. Most sore throats go away without treatment. Follow these instructions at home:      Take over-the-counter medicines only as told by your doctor. ? If your child has a sore throat, do not give your child aspirin.  Drink enough fluids to keep your pee (urine) pale yellow.  Rest when you feel you need to.  To help with pain: ? Sip warm liquids, such as broth, herbal tea, or warm water. ? Eat or drink cold or frozen liquids, such as frozen ice pops. ? Gargle with a salt-water mixture 3-4 times a day or as needed. To make a salt-water mixture, add -1 tsp (3-6 g) of salt to 1 cup (237 mL) of warm water. Mix it until you cannot see the salt anymore. ? Suck on hard candy or throat lozenges. ? Put a cool-mist humidifier in your bedroom at night. ? Sit in the bathroom  with the door closed for 5-10 minutes while you run hot water in the shower.  Do not use any products that contain nicotine or tobacco, such as cigarettes, e-cigarettes, and chewing tobacco. If you need help quitting, ask your doctor.  Wash your hands well and often with soap and water. If soap and water are not available, use hand sanitizer. Contact a doctor if:  You have a fever for more than 2-3 days.  You keep having symptoms for more than 2-3 days.  Your throat does not get better in 7 days.  You have a fever and your symptoms suddenly get worse.  Your child who is 3 months to 35 years old has a temperature of 102.72F (39C) or higher. Get help right away if:  You have trouble breathing.  You cannot swallow fluids, soft foods, or your saliva.  You have swelling in your throat or neck that gets worse.  You keep feeling sick to your stomach (nauseous).  You keep throwing up (vomiting). Summary  A sore throat is pain, burning, irritation, or scratchiness in the throat. Many things can cause a sore throat.  Take over-the-counter medicines only as told by your doctor. Do not give your child aspirin.  Drink plenty of fluids, and rest as needed.  Contact a doctor if your symptoms get worse or your sore throat does not get better within 7 days. This information is not intended to replace advice given to you by your health care provider. Make sure you discuss any  questions you have with your health care provider. Document Revised: 10/11/2017 Document Reviewed: 10/11/2017 Elsevier Patient Education  [employer].

## 2019-10-05 ENCOUNTER — Encounter: Payer: Medicare Other | Admitting: Occupational Therapy

## 2019-10-05 ENCOUNTER — Other Ambulatory Visit: Payer: Self-pay

## 2019-10-05 ENCOUNTER — Ambulatory Visit: Payer: Medicare Other

## 2019-10-05 ENCOUNTER — Ambulatory Visit: Payer: Medicare Other | Admitting: Speech Pathology

## 2019-10-05 DIAGNOSIS — M6281 Muscle weakness (generalized): Secondary | ICD-10-CM | POA: Diagnosis not present

## 2019-10-05 DIAGNOSIS — R2681 Unsteadiness on feet: Secondary | ICD-10-CM | POA: Diagnosis not present

## 2019-10-05 DIAGNOSIS — R4701 Aphasia: Secondary | ICD-10-CM

## 2019-10-05 DIAGNOSIS — R482 Apraxia: Secondary | ICD-10-CM | POA: Diagnosis not present

## 2019-10-05 DIAGNOSIS — R41841 Cognitive communication deficit: Secondary | ICD-10-CM

## 2019-10-05 DIAGNOSIS — R2689 Other abnormalities of gait and mobility: Secondary | ICD-10-CM | POA: Diagnosis not present

## 2019-10-05 NOTE — Therapy (Signed)
[facility] [address] [place], [place], [address] Phone: [phone]   Fax:  [phone]  Speech Language Pathology Treatment  Patient Details  Name: Cecil Bixby MRN: [id number] Date of Birth: 1945/10/05 Referring Provider (SLP): Alysia Penna, MD   Encounter Date: 10/05/2019  End of Session - 10/05/19 1212    Visit Number  14    Number of Visits  17    Date for SLP Re-Evaluation  11/17/19    SLP Start Time  1105    SLP Stop Time   1145    SLP Time Calculation (min)  40 min    Activity Tolerance  Patient tolerated treatment well       Past Medical History:  Diagnosis Date  . Hypertension   . Thyroid disease     Past Surgical History:  Procedure Laterality Date  . IR CT HEAD LTD  07/20/2019  . IR PERCUTANEOUS ART THROMBECTOMY/INFUSION INTRACRANIAL INC DIAG ANGIO  07/20/2019  . RADIOLOGY WITH ANESTHESIA N/A 07/20/2019   Procedure: IR WITH ANESTHESIA;  Surgeon: Luanne Bras, MD;  Location: [facility];  Service: Radiology;  Laterality: N/A;    There were no vitals filed for this visit.         ADULT SLP TREATMENT - 10/05/19 1213      General Information   Behavior/Cognition  Alert;Cooperative;Pleasant mood;Requires cueing      Treatment Provided   Treatment provided  Cognitive-Linquistic      Cognitive-Linquistic Treatment   Treatment focused on  Aphasia    Skilled Treatment  Pt saw MD for throat pain; daughter: "they didn't give her anything for it." She has ENT appointment with Dr. Lucia Gaskins on 5/20. SLP told pt/daughter that if ENT evaluation identifies something SLP can assist with, we will address after we can see report. Patient had family visiting over the weekend and replied she enjoyed cooking with them. SLP worked with pt on expanding length of utterance by discussing familiar recipes. Patient appeared nervous at times in response to SLP questions about cooking process. SLP  reassured and explained that this was not a test but to simply practice talking about something familiar. SLP used white board to write ingredients pt named for a salad she likes to make. Pt named 3 ingredients; required usual mod-max A initially to state how to prepare each ingredient. SLP used whiteboard and wrote steps in [place]. As task progressed, pt described steps with less cuing required.       Assessment / Recommendations / Plan   Plan  Continue with current plan of care      Progression Toward Goals   Progression toward goals  Progressing toward goals       SLP Education - 10/05/19 1220    Education Details  opportunities to practice expanding MLU at home (try narrating actions in simple tasks around the house)    Person(s) Educated  Patient;Child(ren)    Methods  Explanation;Verbal cues    Comprehension  Verbalized understanding;Need further instruction;Returned demonstration       SLP Short Term Goals - 10/05/19 1116      SLP SHORT TERM GOAL #1   Title  pt will produce sentences describing pictures with MLU = 5.5 (at least 25 utterances) in 2 sessions    Time  1    Period  Weeks    Status  Not Met      SLP SHORT TERM GOAL #2   Title  pt will communicate basic wants  and needs functionally (reported by pt and/or family) with family with min questioning cues over 3 sessions    Time  1    Period  Weeks    Status  Achieved      SLP SHORT TERM GOAL #3   Title  pt and/or family will indicate 3 overt s/sx aspiration PNA with modified independence    Time  1    Period  Weeks    Status  On-going      SLP SHORT TERM GOAL #4   Title  pt will follow commands with superlatives or comparatives 80% with occasional min-mod A in 2 sessions    Time  1    Period  Weeks    Status  Not Met       SLP Long Term Goals - 10/05/19 1150      SLP [place] #1   Title  pt will participate in simple conversation with MLU=7.0    Time  1    Period  Weeks   or 17 sessions, for all  LTGs   Status  On-going      SLP LONG TERM GOAL #2   Title  pt will practice speech and language tasks at home at least 4 days/week at least 6 weeks    Time  1    Period  Weeks    Status  On-going      SLP LONG TERM GOAL #3   Title  pt will demo understanding of 10 minutes simple complex conversation with modified indpendence    Time  1    Period  Weeks    Status  Revised      SLP LONG TERM GOAL #4   Title  pt will demonstrate attention appropriate for efficacious completion of therapy tasks for 40 minutes in 8 sessions    Baseline  09-14-19    Time  1    Period  Weeks    Status  On-going       Plan - 10/05/19 1212    Clinical Impression Statement  Pt is limited [place] speaker (interpreter present today) who presents with receptive and expressive aphasia, 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 Pt is making slow but steady progress towards goals. Pt would cont to benefit from skilled ST targeting receptive and expressive language and verbal skills.    Speech Therapy Frequency  2x / week    Duration  --   8 weeks or 17 visits   Treatment/Interventions  Aspiration precaution training;Pharyngeal strengthening exercises;Diet toleration management by SLP;Trials of upgraded texture/liquids;Language facilitation;Cueing hierarchy;SLP instruction and feedback;Internal/external aids;Compensatory strategies;Patient/family education;Cognitive reorganization;Functional tasks;Environmental controls;Multimodal communcation approach    Potential to Achieve Goals  Fair    Potential Considerations  Severity of impairments;Cooperation/participation level       Patient will benefit from skilled therapeutic intervention in order to improve the following deficits and impairments:   Aphasia  Cognitive communication deficit    Problem List Patient Active Problem List   Diagnosis Date Noted  . Renal failure 07/25/2019  . Prediabetes 07/25/2019  . 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 Dysphagia 07/25/2019  . 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 Acute respiratory failure ([facility])   . 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 Atrial fibrillation with RVR ([place])   . Hypertension 07/23/2015  . Hyperlipidemia 07/23/2015  . 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 Hypothyroidism 01/25/2014   Deneise Lever, [place], [place] 10/05/2019, 12:21 PM  [facility] [address] [place], [place], [address] Phone: [phone]   Fax:  [phone]   Name: Sapphire Tygart MRN: [id number] Date of Birth: 1946-04-23

## 2019-10-09 ENCOUNTER — Ambulatory Visit (HOSPITAL_COMMUNITY)
Admission: EM | Admit: 2019-10-09 | Discharge: 2019-10-09 | Disposition: A | Discharge status: Home / Self Care | Payer: Medicare Other | Attending: Internal Medicine | Admitting: Internal Medicine

## 2019-10-09 ENCOUNTER — Encounter (HOSPITAL_COMMUNITY): Payer: Self-pay

## 2019-10-09 ENCOUNTER — Ambulatory Visit: Payer: Medicare Other

## 2019-10-09 ENCOUNTER — Ambulatory Visit: Payer: Medicare Other | Admitting: Rehabilitation

## 2019-10-09 ENCOUNTER — Ambulatory Visit (INDEPENDENT_AMBULATORY_CARE_PROVIDER_SITE_OTHER): Payer: Medicare Other

## 2019-10-09 ENCOUNTER — Other Ambulatory Visit: Payer: Self-pay

## 2019-10-09 ENCOUNTER — Encounter: Payer: Medicare Other | Admitting: Occupational Therapy

## 2019-10-09 DIAGNOSIS — I10 Essential (primary) hypertension: Secondary | ICD-10-CM | POA: Insufficient documentation

## 2019-10-09 DIAGNOSIS — Z8673 Personal history of transient ischemic attack (TIA), and cerebral infarction without residual deficits: Secondary | ICD-10-CM | POA: Diagnosis not present

## 2019-10-09 DIAGNOSIS — R0789 Other chest pain: Secondary | ICD-10-CM | POA: Insufficient documentation

## 2019-10-09 DIAGNOSIS — R0602 Shortness of breath: Secondary | ICD-10-CM | POA: Insufficient documentation

## 2019-10-09 DIAGNOSIS — R2681 Unsteadiness on feet: Secondary | ICD-10-CM | POA: Diagnosis not present

## 2019-10-09 DIAGNOSIS — B001 Herpesviral vesicular dermatitis: Secondary | ICD-10-CM

## 2019-10-09 DIAGNOSIS — Z7901 Long term (current) use of anticoagulants: Secondary | ICD-10-CM | POA: Insufficient documentation

## 2019-10-09 DIAGNOSIS — R059 Cough, unspecified: Secondary | ICD-10-CM

## 2019-10-09 DIAGNOSIS — Z79890 Hormone replacement therapy (postmenopausal): Secondary | ICD-10-CM | POA: Diagnosis not present

## 2019-10-09 DIAGNOSIS — R2689 Other abnormalities of gait and mobility: Secondary | ICD-10-CM | POA: Diagnosis not present

## 2019-10-09 DIAGNOSIS — R482 Apraxia: Secondary | ICD-10-CM | POA: Diagnosis not present

## 2019-10-09 DIAGNOSIS — R4701 Aphasia: Secondary | ICD-10-CM | POA: Diagnosis not present

## 2019-10-09 DIAGNOSIS — E039 Hypothyroidism, unspecified: Secondary | ICD-10-CM | POA: Diagnosis not present

## 2019-10-09 DIAGNOSIS — R05 Cough: Secondary | ICD-10-CM | POA: Diagnosis not present

## 2019-10-09 DIAGNOSIS — J209 Acute bronchitis, unspecified: Secondary | ICD-10-CM | POA: Diagnosis not present

## 2019-10-09 DIAGNOSIS — R41841 Cognitive communication deficit: Secondary | ICD-10-CM | POA: Diagnosis not present

## 2019-10-09 DIAGNOSIS — Z79899 Other long term (current) drug therapy: Secondary | ICD-10-CM | POA: Diagnosis not present

## 2019-10-09 DIAGNOSIS — R079 Chest pain, unspecified: Secondary | ICD-10-CM | POA: Diagnosis not present

## 2019-10-09 DIAGNOSIS — M6281 Muscle weakness (generalized): Secondary | ICD-10-CM | POA: Diagnosis not present

## 2019-10-09 DIAGNOSIS — Z20822 Contact with and (suspected) exposure to covid-19: Secondary | ICD-10-CM | POA: Diagnosis not present

## 2019-10-09 IMAGING — DX DG CHEST 2V
2 series · 2 of 2 positions shown · non-contrast
Comparison: [DATE]

CLINICAL DATA: Cough, chest pain

EXAM:
CHEST - 2 VIEW

[chest pa]
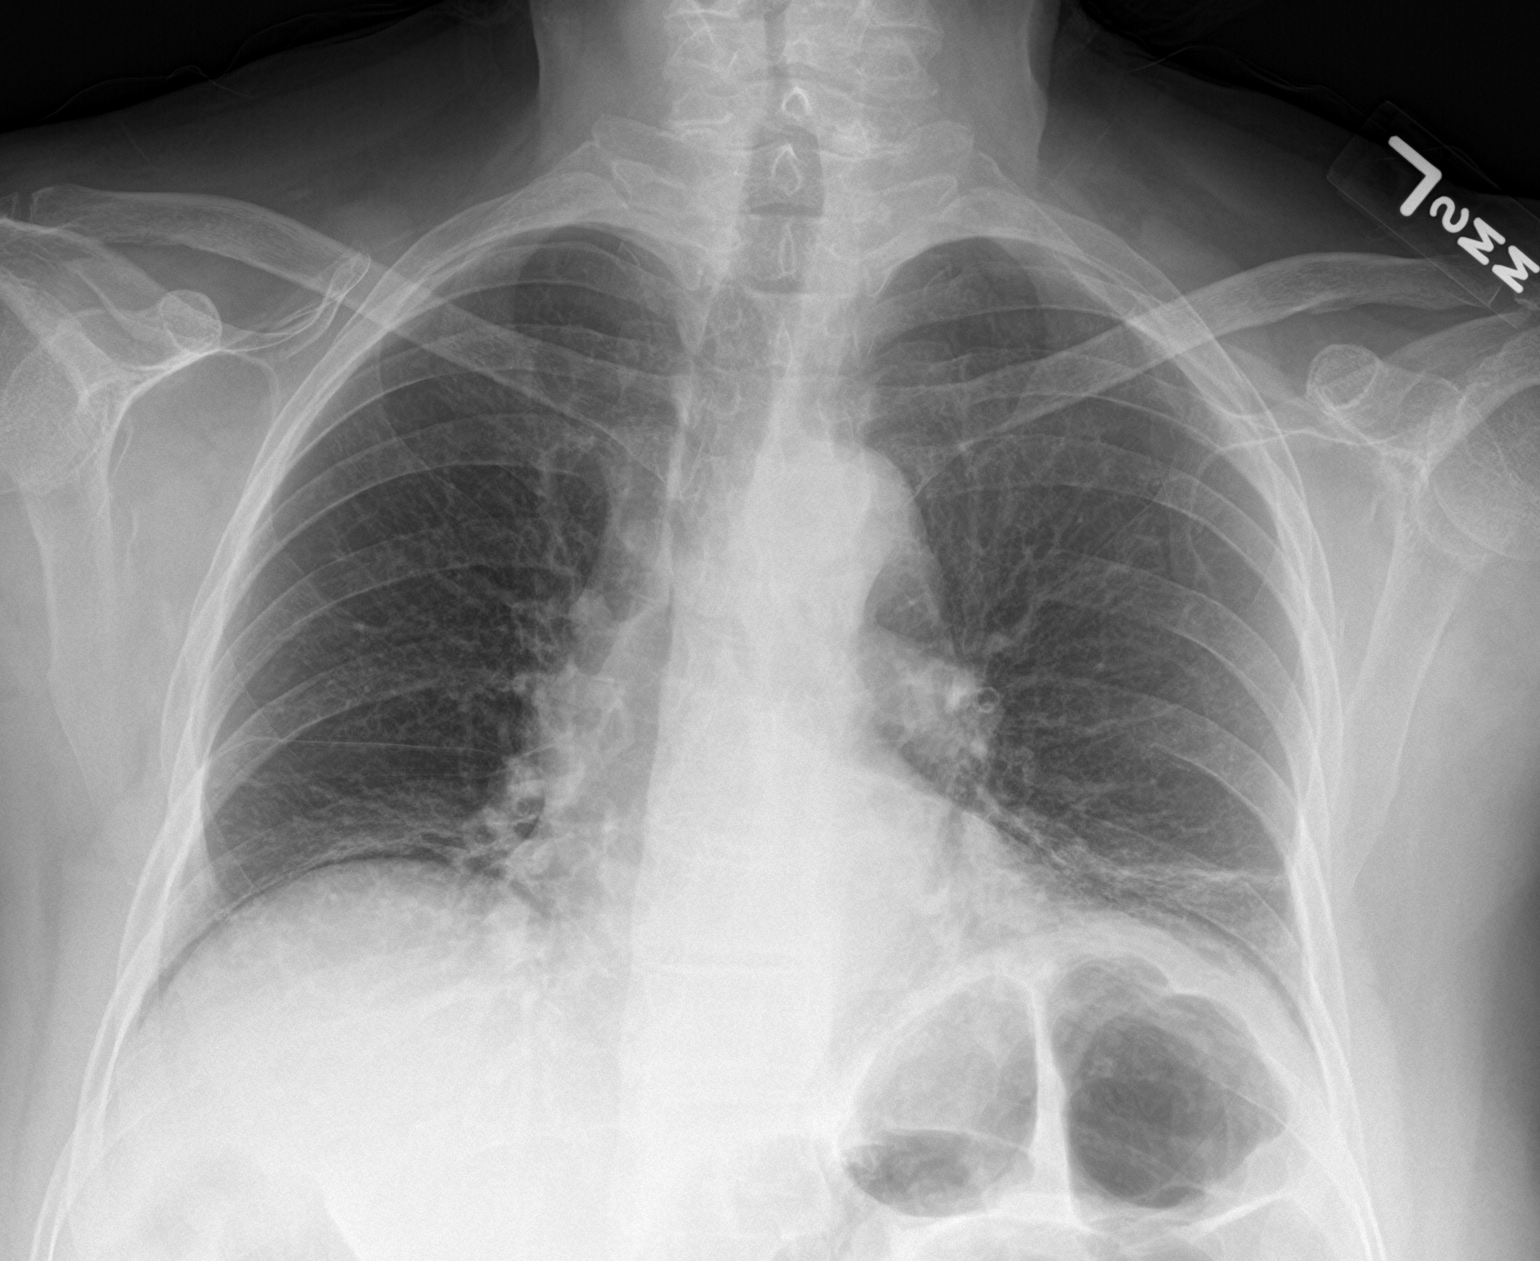

[chest lat]
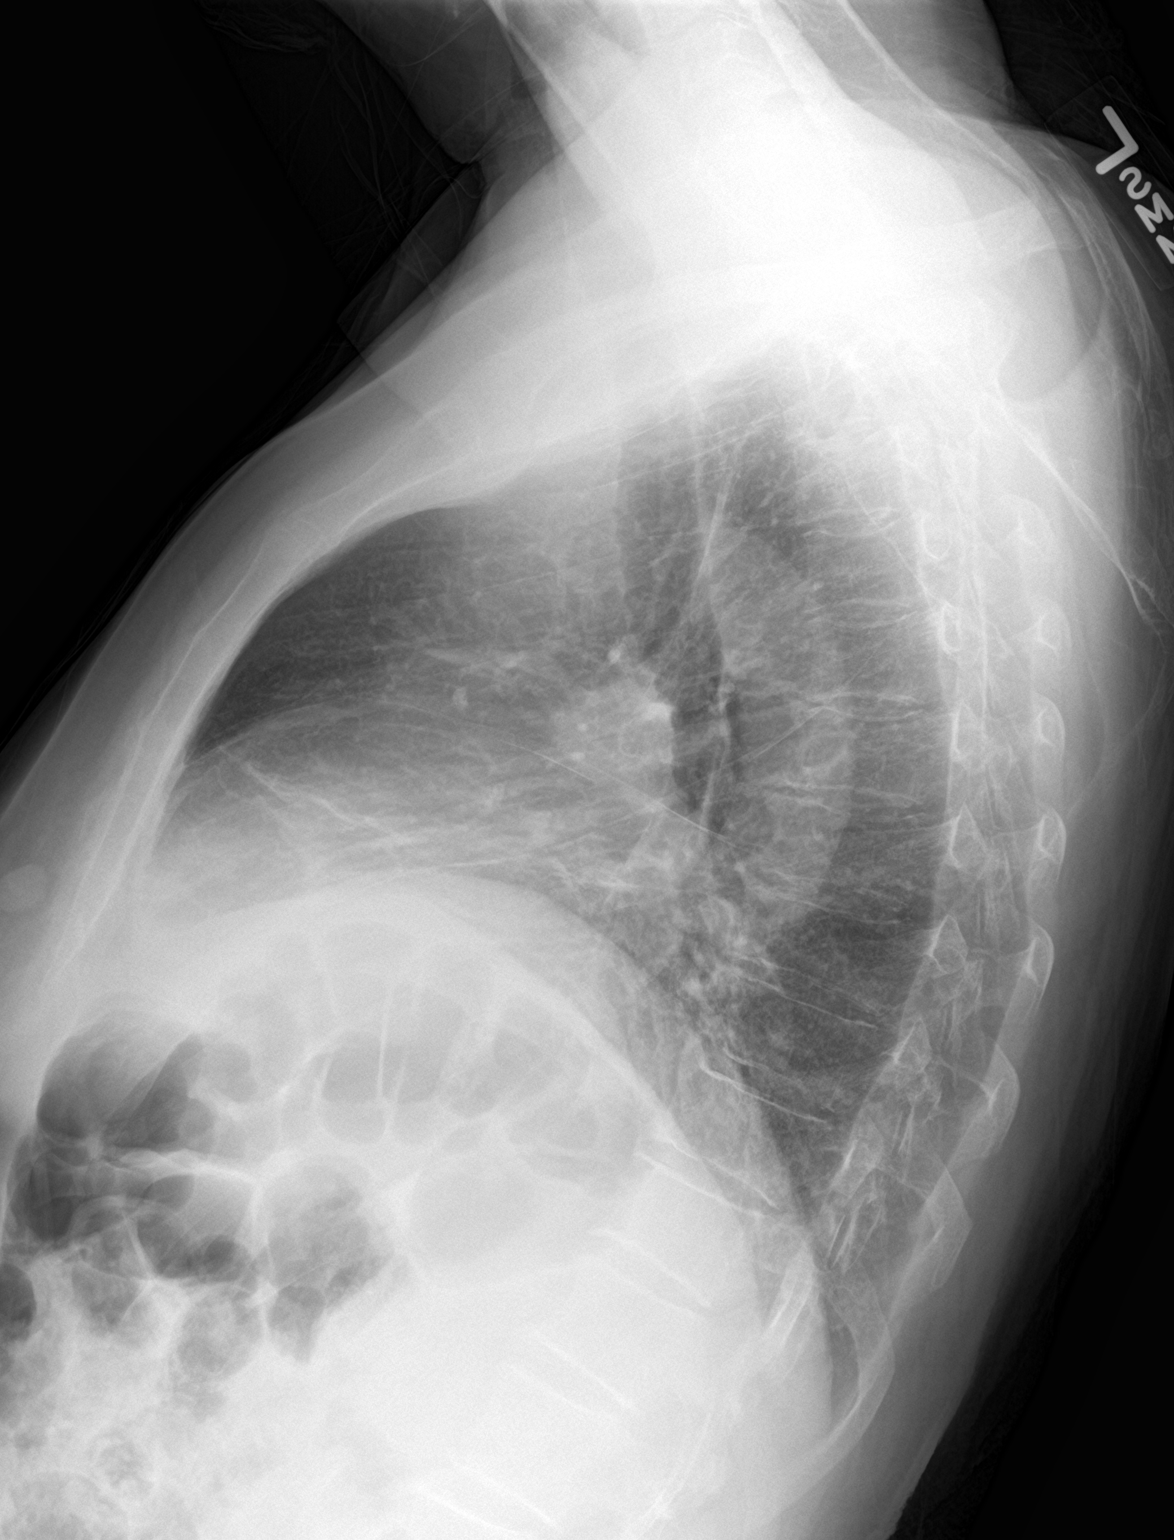

[2 of 2 positions shown; findings below may reference images not displayed]

FINDINGS: Normal heart size and pulmonary vascularity.

Tortuous aorta.

Decreased lung volumes with bibasilar atelectasis.

Upper lungs clear.

Central peribronchial thickening present.

No pleural effusion or pneumothorax.

Bones demineralized.
IMPRESSION: Bronchitic changes with bibasilar atelectasis.

## 2019-10-09 MED ORDER — ALBUTEROL SULFATE HFA 108 (90 BASE) MCG/ACT IN AERS: 1.0000 | INHALATION_SPRAY | Freq: Four times a day (QID) | RESPIRATORY_TRACT | 0 refills | Status: DC | PRN

## 2019-10-09 MED ORDER — PREDNISONE 20 MG PO TABS
ORAL_TABLET | ORAL | 0 refills | Status: DC
Start: 2019-10-09 — End: 2019-10-26

## 2019-10-09 MED FILL — predniSONE 20 MG TABS: 20 | 5 days supply | Qty: 10 | Fill #0

## 2019-10-09 MED FILL — VENTOLIN HFA 90 MCG INHALER: 108 (90 BAS | 25 days supply | Qty: 18 | Fill #0

## 2019-10-09 NOTE — Therapy (Signed)
[facility] [address] [place], [place], [address] Phone: [phone]   Fax:  [phone]  Speech Language Pathology Treatment  Patient Details  Name: Jamie Mitchell MRN: [id number] Date of Birth: 1945/11/28 Referring Provider (SLP): Alysia Penna, MD   Encounter Date: 10/09/2019  End of Session - 10/09/19 1318    Visit Number  15    Number of Visits  17    Date for SLP Re-Evaluation  11/17/19    SLP Start Time  1105    SLP Stop Time   1145    SLP Time Calculation (min)  40 min    Activity Tolerance  Patient limited by pain       Past Medical History:  Diagnosis Date  . Hypertension   . Thyroid disease     Past Surgical History:  Procedure Laterality Date  . IR CT HEAD LTD  07/20/2019  . IR PERCUTANEOUS ART THROMBECTOMY/INFUSION INTRACRANIAL INC DIAG ANGIO  07/20/2019  . RADIOLOGY WITH ANESTHESIA N/A 07/20/2019   Procedure: IR WITH ANESTHESIA;  Surgeon: Luanne Bras, MD;  Location: [facility];  Service: Radiology;  Laterality: N/A;    There were no vitals filed for this visit.  Subjective Assessment - 10/09/19 1128    Subjective  Pt with notable throat / bronchial (?) congestion. Wheezing present.    Patient is accompained by:  --   DIL   Currently in Pain?  Yes    Pain Score  10-Worst pain ever    Pain Location  Throat    Pain Orientation  Right;Left    Pain Descriptors / Indicators  Sore    Pain Type  Acute pain    Pain Onset  1 to 4 weeks ago    Pain Frequency  Constant            ADULT SLP TREATMENT - 10/09/19 1312      General Information   Behavior/Cognition  Alert;Cooperative;Requires cueing      Treatment Provided   Treatment provided  Cognitive-Linquistic      Cognitive-Linquistic Treatment   Treatment focused on  Aphasia;Other (comment)   self care/home management   Skilled Treatment  (Speech tx - ind) SLP assisted (usual mod A) pt in generating reasons for  similarities between two objects. Pt's 18 utteranes had MLU of between 6-7. (Home management/self care - 19 minutes) Some of session was about pt's sore throat and corresponding mucous buildup causing wheezing and difficulty sleeping. Pt attempted to have follow up visit with PCP however reports was unable to be seen due to lack of appointment times. SLP suggested urgent care if concern arises about pt status. ENT eval with Dr. Lucia Gaskins Thursday.      Assessment / Recommendations / Plan   Plan  Continue with current plan of care      Progression Toward Goals   Progression toward goals  Not progressing toward goals (comment)   due to throat pain 10/10 and phlegm        SLP Short Term Goals - 10/05/19 1116      SLP SHORT TERM GOAL #1   Title  pt will produce sentences describing pictures with MLU = 5.5 (at least 25 utterances) in 2 sessions    Time  1    Period  Weeks    Status  Not Met      SLP SHORT TERM GOAL #2   Title  pt will communicate basic wants and needs functionally (reported by pt  and/or family) with family with min questioning cues over 3 sessions    Time  1    Period  Weeks    Status  Achieved      SLP SHORT TERM GOAL #3   Title  pt and/or family will indicate 3 overt s/sx aspiration PNA with modified independence    Time  1    Period  Weeks    Status  On-going      SLP SHORT TERM GOAL #4   Title  pt will follow commands with superlatives or comparatives 80% with occasional min-mod A in 2 sessions    Time  1    Period  Weeks    Status  Not Met       SLP Long Term Goals - 10/09/19 1318      SLP LONG TERM GOAL #1   Title  pt will participate in simple conversation with MLU=7.0    Time  1    Period  Weeks   or 17 sessions, for all LTGs   Status  On-going      SLP LONG TERM GOAL #2   Title  pt will practice speech and language tasks at home at least 4 days/week at least 6 weeks    Time  1    Period  Weeks    Status  On-going      SLP LONG TERM GOAL #3    Title  pt will demo understanding of 10 minutes simple complex conversation with modified indpendence    Time  1    Period  Weeks    Status  Revised      SLP LONG TERM GOAL #4   Title  pt will demonstrate attention appropriate for efficacious completion of therapy tasks for 40 minutes in 8 sessions    Baseline  09-14-19    Time  1    Period  Weeks    Status  On-going       Plan - 10/09/19 1318    Clinical Impression Statement  Pt is limited [place] speaker (interpreter present today) who presents with receptive and expressive aphasia, 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 Pt is making slow but steady progress towards goals. Pt would cont to benefit from skilled ST targeting receptive and expressive language and verbal skills.    Speech Therapy Frequency  2x / week    Duration  --   8 weeks or 17 visits   Treatment/Interventions  Aspiration precaution training;Pharyngeal strengthening exercises;Diet toleration management by SLP;Trials of upgraded texture/liquids;Language facilitation;Cueing hierarchy;SLP instruction and feedback;Internal/external aids;Compensatory strategies;Patient/family education;Cognitive reorganization;Functional tasks;Environmental controls;Multimodal communcation approach    Potential to Achieve Goals  Fair    Potential Considerations  Severity of impairments;Cooperation/participation level       Patient will benefit from skilled therapeutic intervention in order to improve the following deficits and impairments:   Aphasia  Verbal apraxia  Cognitive communication deficit    Problem List Patient Active Problem List   Diagnosis Date Noted  . Renal failure 07/25/2019  . Prediabetes 07/25/2019  . 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 Dysphagia 07/25/2019  . 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 Acute respiratory failure ([place])   . 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 Atrial fibrillation with RVR ([facility])   . Hypertension 07/23/2015  . Hyperlipidemia 07/23/2015  . DJD (degenerative joint disease)  of knee 01/25/2014  . Hypothyroidism 01/25/2014    [facility] ,[place], [place]  10/09/2019, 4:22 PM  [facility] [address] [place], [place], [address] Phone: [phone]   Fax:  [phone]   Name: Breklyn Fabrizio MRN: [id number] Date of Birth: 1945/09/01

## 2019-10-09 NOTE — Patient Instructions (Addendum)
  Please complete the assigned speech therapy homework prior to your next session and return it to the speech therapist at your next visit.  

## 2019-10-09 NOTE — ED Provider Notes (Signed)
[facility]   MRN: [id number] DOB: 10-21-1945  Subjective:   Jamie Mitchell is a 74 y.o. female presenting for 2-week history of persistent productive cough, chest pain and shortness of breath.  Has also started to see blood in her sputum.  Has had some mild throat pain.  Has a history of stroke, hypothyroidism, atrial fibrillation, prediabetes.  Has used a Tylenol syrup without relief.  Completed Covid vaccination about a month ago. Last GFR was 53 on 07/28/2019.  No current facility-administered medications for this encounter.  Current Outpatient Medications:  .  amiodarone (PACERONE) 200 MG tablet, Take 1 tablet (200 mg total) by mouth daily., Disp: 30 tablet, Rfl: 5 .  apixaban (ELIQUIS) 5 MG TABS tablet, Take 1 tablet (5 mg total) by mouth 2 (two) times daily., Disp: 90 tablet, Rfl: 3 .  atorvastatin (LIPITOR) 40 MG tablet, Take 1 tablet (40 mg total) by mouth daily., Disp: 30 tablet, Rfl: 0 .  diclofenac Sodium (VOLTAREN) 1 % GEL, Apply 2 g topically 4 (four) times daily as needed (pain)., Disp: 2 g, Rfl: 0 .  levothyroxine (SYNTHROID) 88 MCG tablet, Take 1 tablet (88 mcg total) by mouth daily before breakfast., Disp: 30 tablet, Rfl: 3   No Known Allergies  Past Medical History:  Diagnosis Date  . Hypertension   . Thyroid disease      Past Surgical History:  Procedure Laterality Date  . IR CT HEAD LTD  07/20/2019  . IR PERCUTANEOUS ART THROMBECTOMY/INFUSION INTRACRANIAL INC DIAG ANGIO  07/20/2019  . RADIOLOGY WITH ANESTHESIA N/A 07/20/2019   Procedure: IR WITH ANESTHESIA;  Surgeon: Luanne Bras, MD;  Location: [facility];  Service: Radiology;  Laterality: N/A;    Family History  Family history unknown: Yes    Social History   Tobacco Use  . Smoking status: Never Smoker  . Smokeless tobacco: Never Used  Substance Use Topics  . Alcohol use: No  . Drug use: No    ROS   Objective:   Vitals: BP (!) 157/86 (BP Location: Right Arm)   Pulse 60    Temp 98.4 F (36.9 C) (Oral)   Resp 17   Wt 150 lb (68 kg)   SpO2 96%   BMI 30.30 kg/m   Physical Exam Constitutional:      General: She is not in acute distress.    Appearance: Normal appearance. She is well-developed. She is not ill-appearing, toxic-appearing or diaphoretic.  HENT:     Head: Normocephalic and atraumatic.      Nose: Nose normal.     Mouth/Throat:     Mouth: Mucous membranes are moist.  Eyes:     Extraocular Movements: Extraocular movements intact.     Pupils: Pupils are equal, round, and reactive to light.  Cardiovascular:     Rate and Rhythm: Normal rate and regular rhythm.     Pulses: Normal pulses.     Heart sounds: Normal heart sounds. No murmur. No friction rub. No gallop.   Pulmonary:     Effort: Pulmonary effort is normal. No respiratory distress.     Breath sounds: No stridor. Rhonchi present. No wheezing or rales.  Skin:    General: Skin is warm and dry.     Findings: No rash.  Neurological:     Mental Status: She is alert and oriented to person, place, and time.  Psychiatric:        Mood and Affect: Mood normal.        Behavior: Behavior  normal.        Thought Content: Thought content normal.        Judgment: Judgment normal.    DG Chest 2 View  Result Date: 10/09/2019 CLINICAL DATA:  Cough, chest pain EXAM: CHEST - 2 VIEW COMPARISON:  07/21/2019 FINDINGS: Normal heart size and pulmonary vascularity. Tortuous aorta. Decreased lung volumes with bibasilar atelectasis. Upper lungs clear. Central peribronchial thickening present. No pleural effusion or pneumothorax. Bones demineralized. IMPRESSION: Bronchitic changes with bibasilar atelectasis. Electronically Signed   By: Lavonia Dana M.D.   On: 10/09/2019 15:03    Assessment and Plan :   PDMP not reviewed this encounter.  1. Cough   2. Atypical chest pain   3. Shortness of breath   4. Cold sore   5. Acute bronchitis, unspecified organism     Will use prednisone course, albuterol inhaler.  Hold off on antibiotics as bronchitis is largely viral, patient does not have COPD, no hx of smoking. Use supportive care otherwise. If no improvement by Friday, rtc for recheck, consider antibiotic at that point. COVID 19 testing pending. Counseled patient on potential for adverse effects with medications prescribed/recommended today, ER and return-to-clinic precautions discussed, patient verbalized understanding.    Jaynee Eagles, [place] 10/09/19 1607

## 2019-10-09 NOTE — ED Triage Notes (Signed)
Pt is here with cough and sore throat for 2 weeks now. Pt has taken Tylenol, Thera ful to relieve discomfort.

## 2019-10-10 LAB — SARS CORONAVIRUS 2 (TAT 6-24 HRS): SARS Coronavirus 2: NEGATIVE

## 2019-10-11 ENCOUNTER — Ambulatory Visit: Payer: Medicare Other

## 2019-10-11 ENCOUNTER — Ambulatory Visit: Payer: Medicare Other | Admitting: Physical Therapy

## 2019-10-11 ENCOUNTER — Encounter: Payer: Medicare Other | Admitting: Occupational Therapy

## 2019-10-12 ENCOUNTER — Other Ambulatory Visit: Payer: Self-pay

## 2019-10-12 ENCOUNTER — Encounter (INDEPENDENT_AMBULATORY_CARE_PROVIDER_SITE_OTHER): Payer: Self-pay | Admitting: Otolaryngology

## 2019-10-12 ENCOUNTER — Ambulatory Visit (INDEPENDENT_AMBULATORY_CARE_PROVIDER_SITE_OTHER): Payer: Medicare Other | Admitting: Otolaryngology

## 2019-10-12 VITALS — Temp 97.7°F

## 2019-10-12 DIAGNOSIS — I639 Cerebral infarction, unspecified: Secondary | ICD-10-CM

## 2019-10-12 DIAGNOSIS — J029 Acute pharyngitis, unspecified: Secondary | ICD-10-CM | POA: Diagnosis not present

## 2019-10-12 NOTE — Progress Notes (Signed)
HPI: Jamie Mitchell is a 74 y.o. female who presents is referred by Dr. Margarita Rana for evaluation of chronic sore throat.  Apparently this began about a month ago and has gradually got little bit worse.  She has been seen in urgent care and was diagnosed with bronchitis.  She was also told that it was secondary to allergies.  The region of the sore throat is on either side of the neck and she points high in the neck just below the mandible.  She has had no fever.  For bronchitis she was prescribed an inhaler as well some pills but does not know the name of the pills.  She presents today with a Optometrist. She apparently had a stroke in February and is undergoing speech therapy.  Initially she had a lot of difficulty with swallowing but now is doing much better but complaining of sore throat when she swallows..  Past Medical History:  Diagnosis Date  . Hypertension   . Thyroid disease    Past Surgical History:  Procedure Laterality Date  . IR CT HEAD LTD  07/20/2019  . IR PERCUTANEOUS ART THROMBECTOMY/INFUSION INTRACRANIAL INC DIAG ANGIO  07/20/2019  . RADIOLOGY WITH ANESTHESIA N/A 07/20/2019   Procedure: IR WITH ANESTHESIA;  Surgeon: Luanne Bras, MD;  Location: [facility];  Service: Radiology;  Laterality: N/A;   Social History   Socioeconomic History  . Marital status: Married    Spouse name: Not on file  . Number of children: Not on file  . Years of education: Not on file  . Highest education level: Not on file  Occupational History  . Not on file  Tobacco Use  . Smoking status: Never Smoker  . Smokeless tobacco: Never Used  Substance and Sexual Activity  . Alcohol use: No  . Drug use: No  . Sexual activity: Not Currently    Birth control/protection: None    Comment: Married  Other Topics Concern  . Not on file  Social History Narrative  . Not on file   Social Determinants of Health   Financial Resource Strain:   . Difficulty of Paying Living Expenses:   Food  Insecurity:   . Worried About Charity fundraiser in the Last Year:   . Arboriculturist in the Last Year:   Transportation Needs: No Transportation Needs  . Lack of Transportation (Medical): No  . Lack of Transportation (Non-Medical): No  Physical Activity:   . Days of Exercise per Week:   . Minutes of Exercise per Session:   Stress:   . Feeling of Stress :   Social Connections:   . Frequency of Communication with Friends and Family:   . Frequency of Social Gatherings with Friends and Family:   . Attends Religious Services:   . Active Member of Clubs or Organizations:   . Attends Archivist Meetings:   [name] Marital Status:    Family History  Family history unknown: Yes   No Known Allergies Prior to Admission medications   Medication Sig Start Date End Date Taking? Authorizing Provider  albuterol (VENTOLIN HFA) 108 (90 Base) MCG/ACT inhaler Inhale 1-2 puffs into the lungs every 6 (six) hours as needed for wheezing or shortness of breath. 10/09/19  Yes Jaynee Eagles, PA-C  amiodarone (PACERONE) 200 MG tablet Take 1 tablet (200 mg total) by mouth daily. 09/14/19  Yes Donato Heinz, MD  apixaban (ELIQUIS) 5 MG TABS tablet Take 1 tablet (5 mg total) by mouth 2 (two)  times daily. 10/02/19  Yes Evans Lance, MD  atorvastatin (LIPITOR) 40 MG tablet Take 1 tablet (40 mg total) by mouth daily. 08/02/19  Yes Angiulli, Lavon Paganini, PA-C  diclofenac Sodium (VOLTAREN) 1 % GEL Apply 2 g topically 4 (four) times daily as needed (pain). 08/02/19  Yes Angiulli, Lavon Paganini, PA-C  levothyroxine (SYNTHROID) 88 MCG tablet Take 1 tablet (88 mcg total) by mouth daily before breakfast. 09/08/19  Yes Charlott Rakes, MD  predniSONE (DELTASONE) 20 MG tablet Take 2 tablets daily with breakfast. 10/09/19  Yes Jaynee Eagles, PA-C     Positive ROS: Otherwise negative  All other systems have been reviewed and were otherwise negative with the exception of those mentioned in the HPI and as above.  Physical  Exam: Constitutional: Alert, well-appearing, no acute distress Ears: External ears without lesions or tenderness. Ear canals are clear bilaterally with intact, clear TMs.  Nasal: External nose without lesions. Septum midline with mild rhinitis.. Clear nasal passages bilaterally.  Both millimeters regions were clear.  No clinical evidence of active infection. Oral: Lips and gums without lesions. Tongue and palate mucosa without lesions. Posterior oropharynx clear.  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  Indirect laryngoscopy revealed clear base of tongue.  Tonsil regions appear benign bilaterally with no exudate. Fiberoptic laryngoscopy was performed through the right nostril.  Posterior nasal cavity was clear.  Nasopharynx was clear.  Base of tongue vallecula and epiglottis were normal.  Vocal cords were clear bilaterally with normal vocal mobility. Neck: No palpable adenopathy or masses.  No significant palpable adenopathy on either side of the neck. Respiratory: Breathing comfortably  Skin: No facial/neck lesions or rash noted.  Laryngoscopy  Date/Time: 10/12/2019 5:06 PM Performed by: Rozetta Nunnery, MD Authorized by: Rozetta Nunnery, MD   Consent:    Consent obtained:  Verbal   Consent given by:  Patient Procedure details:    Indications: direct visualization of the upper aerodigestive tract     Medication:  Afrin   Instrument: flexible fiberoptic laryngoscope     Scope location: right nare   Sinus:    Right nasopharynx: normal   Mouth:    Oropharynx: normal     Vallecula: normal     Base of tongue: normal     Epiglottis: normal   Throat:    Pyriform sinus: normal     True vocal cords: normal   Comments:     On fiberoptic laryngoscopy the hypopharynx and larynx were clear to evaluation with no evidence of neoplasm and no evidence of active infection.    Assessment: Sore throat questionable etiology.  Could possibly be related reflux.   But no evidence of active infection or neoplasm.  Plan: Reviewed the small papilloma noted on the distal end of the uvula with a family member that was with her.  This is benign and is not causing the sore throat. Suggested use of throat lozenges for sore throat as needed.   Radene Journey, MD   CC:

## 2019-10-13 ENCOUNTER — Ambulatory Visit (HOSPITAL_COMMUNITY)
Admission: EM | Admit: 2019-10-13 | Discharge: 2019-10-13 | Disposition: A | Discharge status: Home / Self Care | Payer: Medicare Other | Attending: Urgent Care | Admitting: Urgent Care

## 2019-10-13 ENCOUNTER — Encounter (HOSPITAL_COMMUNITY): Payer: Self-pay

## 2019-10-13 ENCOUNTER — Other Ambulatory Visit: Payer: Self-pay

## 2019-10-13 DIAGNOSIS — J029 Acute pharyngitis, unspecified: Secondary | ICD-10-CM

## 2019-10-13 DIAGNOSIS — J209 Acute bronchitis, unspecified: Secondary | ICD-10-CM

## 2019-10-13 MED ORDER — FAMOTIDINE 20 MG PO TABS
20.0000 mg | ORAL_TABLET | Freq: Two times a day (BID) | ORAL | 0 refills | Status: DC
Start: 2019-10-13 — End: 2019-10-26

## 2019-10-13 MED ORDER — CETIRIZINE HCL 10 MG PO TABS: 10.0000 mg | ORAL_TABLET | Freq: Every day | ORAL | 0 refills | Status: DC

## 2019-10-13 MED FILL — FAMOTIDINE 20 MG TABS: 20 | 30 days supply | Qty: 60 | Fill #0

## 2019-10-13 NOTE — ED Triage Notes (Signed)
Translation services used. Pt c/o sore throat for approx 14 days.  Denies abdom pain, n/v/d, fever,chills, congestion, runny nose, ear ache or HA.   Was evaluated/tx here Monday for same complaint and is here for re-evaluation.  Pt reports improvement in symptoms.

## 2019-10-13 NOTE — ED Provider Notes (Signed)
[facility]   MRN: [id number] DOB: 03/02/1946  Subjective:   Jamie Mitchell is a 74 y.o. female presenting for second opinion on persistent throat pain.  Was seen by ENT yesterday, discussed possibility of acid reflux.  There was no suspicion for malignancy.  Patient does have a history of thyroid disorder and is being managed by her PCP.  Patient is also finishing up treatment with prednisone for bronchitis and reports that she is doing much better.  Reports that her throat symptoms have lasted about a month and alternates between the left and right side, worse over the left.  Sometimes has a runny nose.  Denies fever, chest pain, belly pain, nausea, vomiting.  Patient admits that she eats tomatoes is the only acid reflux causing food.  Otherwise tries a mix of foods.  No current facility-administered medications for this encounter.  Current Outpatient Medications:  .  albuterol (VENTOLIN HFA) 108 (90 Base) MCG/ACT inhaler, Inhale 1-2 puffs into the lungs every 6 (six) hours as needed for wheezing or shortness of breath., Disp: 18 g, Rfl: 0 .  amiodarone (PACERONE) 200 MG tablet, Take 1 tablet (200 mg total) by mouth daily., Disp: 30 tablet, Rfl: 5 .  apixaban (ELIQUIS) 5 MG TABS tablet, Take 1 tablet (5 mg total) by mouth 2 (two) times daily., Disp: 90 tablet, Rfl: 3 .  atorvastatin (LIPITOR) 40 MG tablet, Take 1 tablet (40 mg total) by mouth daily., Disp: 30 tablet, Rfl: 0 .  diclofenac Sodium (VOLTAREN) 1 % GEL, Apply 2 g topically 4 (four) times daily as needed (pain)., Disp: 2 g, Rfl: 0 .  levothyroxine (SYNTHROID) 88 MCG tablet, Take 1 tablet (88 mcg total) by mouth daily before breakfast., Disp: 30 tablet, Rfl: 3 .  predniSONE (DELTASONE) 20 MG tablet, Take 2 tablets daily with breakfast., Disp: 10 tablet, Rfl: 0   No Known Allergies  Past Medical History:  Diagnosis Date  . Hypertension   . Thyroid disease      Past Surgical History:  Procedure Laterality  Date  . IR CT HEAD LTD  07/20/2019  . IR PERCUTANEOUS ART THROMBECTOMY/INFUSION INTRACRANIAL INC DIAG ANGIO  07/20/2019  . RADIOLOGY WITH ANESTHESIA N/A 07/20/2019   Procedure: IR WITH ANESTHESIA;  Surgeon: Luanne Bras, MD;  Location: [facility];  Service: Radiology;  Laterality: N/A;    Family History  Family history unknown: Yes    Social History   Tobacco Use  . Smoking status: Never Smoker  . Smokeless tobacco: Never Used  Substance Use Topics  . Alcohol use: No  . Drug use: No    ROS   Objective:   Vitals: BP 128/74 (BP Location: Right Arm)   Pulse (!) 56   Temp 98 F (36.7 C) (Oral)   Resp 18   SpO2 99%   Physical Exam Constitutional:      General: She is not in acute distress.    Appearance: Normal appearance. She is well-developed. She is not ill-appearing, toxic-appearing or diaphoretic.  HENT:     Head: Normocephalic and atraumatic.     Nose: Nose normal. No congestion or rhinorrhea.     Mouth/Throat:     Mouth: Mucous membranes are moist. No oral lesions.     Pharynx: Uvula midline. No pharyngeal swelling, oropharyngeal exudate, posterior oropharyngeal erythema or uvula swelling.     Tonsils: No tonsillar exudate or tonsillar abscesses. 0 on the right. 0 on the left.  Eyes:     General: No scleral icterus.  Extraocular Movements: Extraocular movements intact.     Pupils: Pupils are equal, round, and reactive to light.  Neck:     Thyroid: No thyromegaly.  Cardiovascular:     Rate and Rhythm: Normal rate.  Pulmonary:     Effort: Pulmonary effort is normal.  Musculoskeletal:     Cervical back: Normal range of motion and neck supple.  Lymphadenopathy:     Cervical: No cervical adenopathy.  Skin:    General: Skin is warm and dry.  Neurological:     General: No focal deficit present.     Mental Status: She is alert and oriented to person, place, and time.  Psychiatric:        Mood and Affect: Mood normal.        Behavior: Behavior normal.       Assessment and Plan :   PDMP not reviewed this encounter.  1. Sore throat   2. Acute bronchitis, unspecified organism     Recommended starting Zyrtec to address possible postnasal drainage/allergic rhinitis.  Also discussed GERD causing foods and patient was inclined to trial Pepcid for this.  We discussed finding a PCP that is Spanish-speaking to better understand an outpatient.  Information provided to her and her family member. Counseled patient on potential for adverse effects with medications prescribed/recommended today, ER and return-to-clinic precautions discussed, patient verbalized understanding.    Jaynee Eagles, PA-C 10/13/19 1144

## 2019-10-16 ENCOUNTER — Encounter: Payer: Medicare Other | Admitting: Occupational Therapy

## 2019-10-16 ENCOUNTER — Ambulatory Visit: Payer: Medicare Other | Admitting: Rehabilitation

## 2019-10-16 ENCOUNTER — Other Ambulatory Visit: Payer: Self-pay

## 2019-10-16 ENCOUNTER — Ambulatory Visit: Payer: Medicare Other

## 2019-10-16 DIAGNOSIS — M6281 Muscle weakness (generalized): Secondary | ICD-10-CM | POA: Diagnosis not present

## 2019-10-16 DIAGNOSIS — R41841 Cognitive communication deficit: Secondary | ICD-10-CM | POA: Diagnosis not present

## 2019-10-16 DIAGNOSIS — R482 Apraxia: Secondary | ICD-10-CM

## 2019-10-16 DIAGNOSIS — R2689 Other abnormalities of gait and mobility: Secondary | ICD-10-CM | POA: Diagnosis not present

## 2019-10-16 DIAGNOSIS — R2681 Unsteadiness on feet: Secondary | ICD-10-CM | POA: Diagnosis not present

## 2019-10-16 DIAGNOSIS — R4701 Aphasia: Secondary | ICD-10-CM

## 2019-10-16 NOTE — Therapy (Signed)
[facility] [address] [place], [place], [address] Phone: [phone]   Fax:  [phone]  Speech Language Pathology Treatment  Patient Details  Name: Jamie Mitchell MRN: [id number] Date of Birth: 01-12-1946 Referring Provider (SLP): Alysia Penna, MD   Encounter Date: 10/16/2019  End of Session - 10/16/19 1527    Visit Number  16    Number of Visits  17    Date for SLP Re-Evaluation  11/17/19    SLP Start Time  1450    SLP Stop Time   1530    SLP Time Calculation (min)  40 min    Activity Tolerance  Patient tolerated treatment well       Past Medical History:  Diagnosis Date  . Hypertension   . Thyroid disease     Past Surgical History:  Procedure Laterality Date  . IR CT HEAD LTD  07/20/2019  . IR PERCUTANEOUS ART THROMBECTOMY/INFUSION INTRACRANIAL INC DIAG ANGIO  07/20/2019  . RADIOLOGY WITH ANESTHESIA N/A 07/20/2019   Procedure: IR WITH ANESTHESIA;  Surgeon: Luanne Bras, MD;  Location: [facility];  Service: Radiology;  Laterality: N/A;    There were no vitals filed for this visit.         ADULT SLP TREATMENT - 10/16/19 0001      General Information   Behavior/Cognition  Alert;Cooperative;Requires cueing      Treatment Provided   Treatment provided  Cognitive-Linquistic      Cognitive-Linquistic Treatment   Treatment focused on  Aphasia;Patient/family/caregiver education    Skilled Treatment  Pt DIL states pt has more difficulty talking in the early morings and less difficulty later in the day. Pt stated it was not difficlt telling SLP about the pictures in session today. Pt req'd rare min-mod A to think of ingredients in soup, and mod A usually to name spices to put in soup.       Assessment / Recommendations / Plan   Plan  Continue with current plan of care      Progression Toward Goals   Progression toward goals  Progressing toward goals         SLP Short Term Goals  - 10/16/19 1421      SLP SHORT TERM GOAL #1   Title  pt will produce sentences describing pictures with MLU = 5.5 (at least 25 utterances) in 2 sessions    Status  Not Met      SLP SHORT TERM GOAL #2   Title  pt will communicate basic wants and needs functionally (reported by pt and/or family) with family with min questioning cues over 3 sessions    Status  Achieved      SLP [place] #3   Title  pt and/or family will indicate 3 overt s/sx aspiration PNA with modified independence    Status  On-going      SLP SHORT TERM GOAL #4   Title  pt will follow commands with superlatives or comparatives 80% with occasional min-mod A in 2 sessions    Status  Not Met       SLP Long Term Goals - 10/16/19 1431      SLP LONG TERM GOAL #1   Title  pt will participate in simple conversation with MLU=7.0    Time  --    Period  --   or 17 sessions, for all LTGs   Status  Achieved      SLP LONG TERM GOAL #2  Title  pt will practice speech and language tasks at home at least 4 days/week at least 6 weeks    Time  1    Period  Weeks    Status  On-going      SLP LONG TERM GOAL #3   Title  pt will demo understanding of 10 minutes simple complex conversation with modified indpendence    Time  1    Period  Weeks    Status  Revised      SLP LONG TERM GOAL #4   Title  pt will demonstrate attention appropriate for efficacious completion of therapy tasks for 40 minutes in 8 sessions    Baseline  09-14-19    Time  1    Period  Weeks    Status  On-going       Plan - 10/16/19 1527    Clinical Impression Statement  Pt is limited [place] speaker (interpreter present today) who presents with receptive and expressive aphasia, 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 Pt and daugher in law agree pt is talking much more than at evaluation. Pt would cont to benefit from skilled ST targeting receptive and expressive language and verbal skills.    Speech Therapy Frequency  2x / week    Duration  --    8 weeks or 17 visits   Treatment/Interventions  Aspiration precaution training;Pharyngeal strengthening exercises;Diet toleration management by SLP;Trials of upgraded texture/liquids;Language facilitation;Cueing hierarchy;SLP instruction and feedback;Internal/external aids;Compensatory strategies;Patient/family education;Cognitive reorganization;Functional tasks;Environmental controls;Multimodal communcation approach    Potential to Achieve Goals  Fair    Potential Considerations  Severity of impairments;Cooperation/participation level       Patient will benefit from skilled therapeutic intervention in order to improve the following deficits and impairments:   Aphasia  Verbal apraxia  Cognitive communication deficit    Problem List Patient Active Problem List   Diagnosis Date Noted  . Renal failure 07/25/2019  . Prediabetes 07/25/2019  . 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 Dysphagia 07/25/2019  . 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 Acute respiratory failure ([place])   . Tachycardia-bradycardia syndrome ([place])   . Atrial fibrillation with RVR ([place])   . Hypertension 07/23/2015  . Hyperlipidemia 07/23/2015  . 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 Hypothyroidism 01/25/2014    [facility] ,[place], [place]  10/16/2019, 3:32 PM  [facility] [address] [place], [place], [address] Phone: [phone]   Fax:  [phone]   Name: Yeilin Zweber MRN: [id number] Date of Birth: May 06, 1946

## 2019-10-18 ENCOUNTER — Encounter: Payer: Medicare Other | Admitting: Occupational Therapy

## 2019-10-18 ENCOUNTER — Ambulatory Visit: Payer: Medicare Other | Admitting: Physical Therapy

## 2019-10-18 ENCOUNTER — Ambulatory Visit: Payer: Medicare Other

## 2019-10-18 ENCOUNTER — Other Ambulatory Visit: Payer: Self-pay

## 2019-10-18 DIAGNOSIS — R2681 Unsteadiness on feet: Secondary | ICD-10-CM | POA: Diagnosis not present

## 2019-10-18 DIAGNOSIS — R482 Apraxia: Secondary | ICD-10-CM

## 2019-10-18 DIAGNOSIS — R2689 Other abnormalities of gait and mobility: Secondary | ICD-10-CM | POA: Diagnosis not present

## 2019-10-18 DIAGNOSIS — R4701 Aphasia: Secondary | ICD-10-CM

## 2019-10-18 DIAGNOSIS — R41841 Cognitive communication deficit: Secondary | ICD-10-CM

## 2019-10-18 DIAGNOSIS — M6281 Muscle weakness (generalized): Secondary | ICD-10-CM | POA: Diagnosis not present

## 2019-10-18 NOTE — Therapy (Signed)
[facility] [address] [place], [place], [address] Phone: [phone]   Fax:  [phone]  Speech Language Pathology Treatment  Patient Details  Name: Jamie Mitchell MRN: [id number] Date of Birth: 04-29-1946 Referring Provider (SLP): Alysia Penna, MD   Encounter Date: 10/18/2019  End of Session - 10/18/19 1755    Visit Number  17    Number of Visits  25    Date for SLP Re-Evaluation  12/22/19       Past Medical History:  Diagnosis Date  . Hypertension   . Thyroid disease     Past Surgical History:  Procedure Laterality Date  . IR CT HEAD LTD  07/20/2019  . IR PERCUTANEOUS ART THROMBECTOMY/INFUSION INTRACRANIAL INC DIAG ANGIO  07/20/2019  . RADIOLOGY WITH ANESTHESIA N/A 07/20/2019   Procedure: IR WITH ANESTHESIA;  Surgeon: Luanne Bras, MD;  Location: [facility];  Service: Radiology;  Laterality: N/A;    There were no vitals filed for this visit.  Subjective Assessment - 10/18/19 1409    Subjective  Less wheezing than previous session.    Currently in Pain?  No/denies            ADULT SLP TREATMENT - 10/18/19 1411      General Information   Behavior/Cognition  Alert;Cooperative;Requires cueing      Treatment Provided   Treatment provided  Cognitive-Linquistic      Cognitive-Linquistic Treatment   Treatment focused on  Aphasia;Patient/family/caregiver education    Skilled Treatment  SLP made sure family knows that it is ok for son or DIL to assist pt (if word is known) by stating first couple of sounds for pt - pt affimed this is hepful for her.  To target verbl expression SLP had pt perform responsive and divergent naming tasks with usual mod A req'd for >2 items.       Assessment / Recommendations / Plan   Plan  Continue with current plan of care      Progression Toward Goals   Progression toward goals  Progressing toward goals       SLP Education - 10/18/19 1754    Education Details  ok to provide phonemic cues for pt, work on language in cooking tasks and other functional tasks    Person(s) Educated  Patient;Child(ren)    Methods  Explanation;Demonstration    Comprehension  Verbalized understanding       SLP Short Term Goals - 10/16/19 1421      SLP SHORT TERM GOAL #1   Title  pt will produce sentences describing pictures with MLU = 5.5 (at least 25 utterances) in 2 sessions    Status  Not Met      SLP SHORT TERM GOAL #2   Title  pt will communicate basic wants and needs functionally (reported by pt and/or family) with family with min questioning cues over 3 sessions    Status  Achieved      SLP [place] #3   Title  pt and/or family will indicate 3 overt s/sx aspiration PNA with modified independence    Status  On-going      SLP SHORT TERM GOAL #4   Title  pt will follow commands with superlatives or comparatives 80% with occasional min-mod A in 2 sessions    Status  Not Met       SLP Long Term Goals - 10/18/19 [place] #1   Title  pt will participate in simple conversation with MLU=7.0    Period  --   or 17 sessions, for all LTGs   Status  Achieved      SLP LONG TERM GOAL #2   Title  pt will practice speech and language tasks at home at least 4 days/week at least 6 weeks    Status  Partially Met      SLP LONG TERM GOAL #3   Title  pt will demo understanding of 10 minutes simple-mod complex conversation with modified indpendence    Time  4   renewed 10-18-19   Period  Weeks    Status  On-going      SLP LONG TERM GOAL #4   Title  pt will demonstrate attention appropriate for efficacious completion of therapy tasks for 40 minutes in 8 sessions    Baseline  09-14-19, 10-16-19, 10-18-19    Status  Partially Met      SLP LONG TERM GOAL #5   Title  pt will generate 8 minutes functional simple to mod complex conversation in 3 visits    Time  [place] - 10/18/19 1755     Clinical Impression Statement  See pt's goal summary. 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 Pt and daugher in law agree pt is talking much more than at evaluation, although her speech is not t baseline. SLP would assume (as SLP is not native [place] speaker) that mod complex language is near-functional. Pt is functional for verbl expression in simple to mod complex conversation in shorter conversational spans (approx 3-4 minutes).However, pt would cont to benefit from skilled ST targeting receptive and expressive language and verbal skills.    Speech Therapy Frequency  2x / week    Duration  --   4 more weeks or 25 visits   Treatment/Interventions  Aspiration precaution training;Pharyngeal strengthening exercises;Diet toleration management by SLP;Trials of upgraded texture/liquids;Language facilitation;Cueing hierarchy;SLP instruction and feedback;Internal/external aids;Compensatory strategies;Patient/family education;Cognitive reorganization;Functional tasks;Environmental controls;Multimodal communcation approach    Potential to Achieve Goals  Fair    Potential Considerations  Severity of impairments;Cooperation/participation level       Patient will benefit from skilled therapeutic intervention in order to improve the following deficits and impairments:   Aphasia - Plan: SLP plan of care cert/re-cert  Verbal apraxia - Plan: SLP plan of care cert/re-cert  Cognitive communication deficit - Plan: SLP plan of care cert/re-cert    Problem List Patient Active Problem List   Diagnosis Date Noted  . Renal failure 07/25/2019  . Prediabetes 07/25/2019  . 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 Dysphagia 07/25/2019  . 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 Acute respiratory failure ([place])   . Tachycardia-bradycardia syndrome ([place])   . Atrial fibrillation with RVR ([place])   . Hypertension 07/23/2015  . Hyperlipidemia 07/23/2015  . 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 Hypothyroidism 01/25/2014    [facility] ,[place], [place]  10/18/2019, 6:05 PM  [facility] [address] [place], [place], [address] Phone: [phone]   Fax:  [phone]   Name: Deyonna Fitzsimmons MRN: [id number] Date of Birth: 19-Sep-1945

## 2019-10-24 ENCOUNTER — Ambulatory Visit: Payer: Medicare Other | Attending: Family Medicine | Admitting: Speech Pathology

## 2019-10-24 ENCOUNTER — Other Ambulatory Visit: Payer: Self-pay

## 2019-10-24 DIAGNOSIS — R1312 Dysphagia, oropharyngeal phase: Secondary | ICD-10-CM | POA: Diagnosis not present

## 2019-10-24 DIAGNOSIS — I63512 Cerebral infarction due to unspecified occlusion or stenosis of left middle cerebral artery: Secondary | ICD-10-CM | POA: Diagnosis not present

## 2019-10-24 NOTE — Therapy (Signed)
[facility] [address] [place], [place], [address] Phone: [phone]   Fax:  [phone]  Speech Language Pathology Treatment  Patient Details  Name: Jamie Mitchell MRN: [id number] Date of Birth: [birth year] Referring Provider (SLP): Alysia Penna, MD   Encounter Date: 10/24/2019  End of Session - 10/24/19 1310    Visit Number  18    Number of Visits  25    Date for SLP Re-Evaluation  12/22/19    SLP Start Time  1021    SLP Stop Time   1100    SLP Time Calculation (min)  39 min    Activity Tolerance  Patient tolerated treatment well       Past Medical History:  Diagnosis Date  . Hypertension   . Thyroid disease     Past Surgical History:  Procedure Laterality Date  . IR CT HEAD LTD  07/20/2019  . IR PERCUTANEOUS ART THROMBECTOMY/INFUSION INTRACRANIAL INC DIAG ANGIO  07/20/2019  . RADIOLOGY WITH ANESTHESIA N/A 07/20/2019   Procedure: IR WITH ANESTHESIA;  Surgeon: Luanne Bras, MD;  Location: [facility];  Service: Radiology;  Laterality: N/A;    There were no vitals filed for this visit.  Subjective Assessment - 10/24/19 1023    Subjective  Wheezing today; "She was only doing good while she was on the medication."    Patient is accompained by:  Interpreter   Eddie, interpreter   Currently in Pain?  Yes    Pain Score  3     Pain Location  Throat    Pain Orientation  Right;Left            ADULT SLP TREATMENT - 10/24/19 1301      General Information   Behavior/Cognition  Alert;Cooperative      Treatment Provided   Treatment provided  Dysphagia      Dysphagia Treatment   Temperature Spikes Noted  No    Respiratory Status  Room air   wheezing, shortness of breath   Treatment Methods  Skilled observation;Upgraded PO texture trial;Differential diagnosis;Patient/caregiver education    Patient observed directly with PO's  Yes    Type of PO's observed  Dysphagia 3 (soft);Thin liquids     Feeding  Able to feed self    Liquids provided via  Cup    Oral Phase Signs & Symptoms  Prolonged mastication    Pharyngeal Phase Signs & Symptoms  Changes in respirations;Wet vocal quality;Immediate throat clear   grimmaces   Type of cueing  Verbal    Amount of cueing  Minimal    Other treatment/comments  Patient wheezing today, and reports occasional coughing/choking with solids>liquids. Pt consumed soft solids; prolonged mastication. Pt grimmacing with swallow initiation, reporting globus sensation with solid. Liquid wash reduces this sensation; questionable wet vocal quality intermittently, as well as immediate throat clear/subtle cough x 2.  Wheezing more pronounced after consuming POs. Daughter is very concerned about pt's pulmonary issues; brochitis improved with medication but she feels this has worsened since she is no longer taking medication for this. SLP reviewed pt's MBS from inpatient stay which showed primary oral dysphagia, however pt did have accumulation in the valleculae and one instance of sensed aspiration during this study. Education completed re: swallow physiology and signs of aspiration pneumonia; discussed with pt and daughter (via interpreter) options including pulmonology and/or repeat MBS to determine whether dysphagia is a possible contributor to pt's pulmonary issues.      Assessment / Recommendations / Plan  Plan  MBS;Continue with current plan of care      Progression Toward Goals   Progression toward goals  Progressing toward goals         SLP Short Term Goals - 10/24/19 1317      SLP SHORT TERM GOAL #1   Title  pt will produce sentences describing pictures with MLU = 5.5 (at least 25 utterances) in 2 sessions    Status  Not Met      SLP SHORT TERM GOAL #2   Title  pt will communicate basic wants and needs functionally (reported by pt and/or family) with family with min questioning cues over 3 sessions    Status  Achieved      SLP [place] #3    Title  pt and/or family will indicate 3 overt s/sx aspiration PNA with modified independence    Status  On-going      SLP SHORT TERM GOAL #4   Title  pt will follow commands with superlatives or comparatives 80% with occasional min-mod A in 2 sessions    Status  Not Met       SLP Long Term Goals - 10/24/19 1317      SLP LONG TERM GOAL #1   Title  pt will participate in simple conversation with MLU=7.0    Period  --   or 17 sessions, for all LTGs   Status  Achieved      SLP LONG TERM GOAL #2   Title  pt will practice speech and language tasks at home at least 4 days/week at least 6 weeks    Status  Partially Met      SLP LONG TERM GOAL #3   Title  pt will demo understanding of 10 minutes simple-mod complex conversation with modified indpendence    Time  4   renewed 10-18-19   Period  Weeks    Status  On-going      SLP LONG TERM GOAL #4   Title  pt will demonstrate attention appropriate for efficacious completion of therapy tasks for 40 minutes in 8 sessions    Baseline  09-14-19, 10-16-19, 10-18-19    Status  Partially Met      SLP LONG TERM GOAL #5   Title  pt will generate 8 minutes functional simple to mod complex conversation in 3 visits    Time  4    Period  Weeks    Status  New       Plan - 10/24/19 1311    Clinical Impression Statement  Pt is limited [place] speaker (interpreter present today) who presents with receptive and expressive aphasia, 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 Pt wheezing today; recently made ED visits due to brochitis. Reports pain and globus sensation when swallowing; wheezing today was more pronounced after POs. Recommend repeat MBS for instrumental assessment of swallow function as pt reports and exhibits overt signs of aspiration today. Pt and daugher in law agree pt is talking much more than at evaluation, although her speech is not t baseline. SLP would assume (as SLP is not native [place] speaker) that mod complex language is  near-functional. Pt is functional for verbl expression in simple to mod complex conversation in shorter conversational spans (approx 3-4 minutes).However, pt would cont to benefit from skilled ST targeting dysphagia, receptive and expressive language and verbal skills.    Speech Therapy Frequency  2x / week    Duration  --   4 more weeks or  25 visits   Treatment/Interventions  Aspiration precaution training;Pharyngeal strengthening exercises;Diet toleration management by SLP;Trials of upgraded texture/liquids;Language facilitation;Cueing hierarchy;SLP instruction and feedback;Internal/external aids;Compensatory strategies;Patient/family education;Cognitive reorganization;Functional tasks;Environmental controls;Multimodal communcation approach    Potential to Achieve Goals  Fair    Potential Considerations  Severity of impairments;Cooperation/participation level       Patient will benefit from skilled therapeutic intervention in order to improve the following deficits and impairments:   Dysphagia, oropharyngeal phase  Left middle cerebral artery stroke [facility])    Problem List Patient Active Problem List   Diagnosis Date Noted  . Renal failure 07/25/2019  . Prediabetes 07/25/2019  . 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 Dysphagia 07/25/2019  . 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 Acute respiratory failure ([place])   . Tachycardia-bradycardia syndrome ([place])   . Atrial fibrillation with RVR ([place])   . Hypertension 07/23/2015  . Hyperlipidemia 07/23/2015  . 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 Hypothyroidism 01/25/2014   Deneise Lever, [place], Anon Raices E Savaughn Karwowski 10/24/2019, 1:18 PM  [facility] [address] [place], [place], [address] Phone: [phone]   Fax:  [phone]   Name: Jamie Mitchell MRN: [id number] Date of Birth: 1946/05/12

## 2019-10-24 NOTE — Patient Instructions (Signed)
Signos de neumona por Production assistant, radio / opresin en el pecho Fiebre (puede ser de bajo grado) Tos -Con flema maloliente (esputo) -Con esputo que contiene pus o sangre -Con esputo verdoso Fatiga Dificultad para respirar Sibilancias  ** SI TIENE ESTOS SIGNOS, COMUNQUESE CON SU MDICO O VAYA AL DEPARTAMENTO DE URGENCIAS O ATENCIN URGENTE LO ANTES POSIBLE **   Podemos considerar repetir la prueba de deglucin que le hicieron en el hospital para ver si un problema para tragar puede estar contribuyendo a sus sntomas respiratorios.  Tambin puede considerar pedirle a su mdico que le recomiende a Set designer.

## 2019-10-25 ENCOUNTER — Ambulatory Visit: Payer: Self-pay | Admitting: *Deleted

## 2019-10-26 ENCOUNTER — Telehealth: Payer: Self-pay | Admitting: Cardiology

## 2019-10-26 ENCOUNTER — Ambulatory Visit: Payer: Self-pay | Admitting: *Deleted

## 2019-10-26 ENCOUNTER — Emergency Department (HOSPITAL_COMMUNITY)
Admission: EM | Admit: 2019-10-26 | Discharge: 2019-10-26 | Disposition: A | Discharge status: Home / Self Care | Payer: Medicare Other | Attending: Emergency Medicine | Admitting: Emergency Medicine

## 2019-10-26 ENCOUNTER — Emergency Department (HOSPITAL_COMMUNITY): Payer: Medicare Other

## 2019-10-26 ENCOUNTER — Encounter (HOSPITAL_COMMUNITY): Payer: Self-pay

## 2019-10-26 DIAGNOSIS — R0602 Shortness of breath: Secondary | ICD-10-CM | POA: Diagnosis not present

## 2019-10-26 DIAGNOSIS — J029 Acute pharyngitis, unspecified: Secondary | ICD-10-CM | POA: Insufficient documentation

## 2019-10-26 DIAGNOSIS — I4891 Unspecified atrial fibrillation: Secondary | ICD-10-CM | POA: Insufficient documentation

## 2019-10-26 DIAGNOSIS — Z79899 Other long term (current) drug therapy: Secondary | ICD-10-CM | POA: Insufficient documentation

## 2019-10-26 DIAGNOSIS — E079 Disorder of thyroid, unspecified: Secondary | ICD-10-CM | POA: Diagnosis not present

## 2019-10-26 DIAGNOSIS — Z7901 Long term (current) use of anticoagulants: Secondary | ICD-10-CM | POA: Diagnosis not present

## 2019-10-26 DIAGNOSIS — I10 Essential (primary) hypertension: Secondary | ICD-10-CM | POA: Insufficient documentation

## 2019-10-26 DIAGNOSIS — R05 Cough: Secondary | ICD-10-CM | POA: Diagnosis not present

## 2019-10-26 HISTORY — DX: Cerebral infarction, unspecified: I63.9

## 2019-10-26 LAB — CBC
HCT: 45.6 % (ref 36.0–46.0)
Hemoglobin: 14.8 g/dL (ref 12.0–15.0)
MCH: 29.2 pg (ref 26.0–34.0)
MCHC: 32.5 g/dL (ref 30.0–36.0)
MCV: 90.1 fL (ref 80.0–100.0)
Platelets: 317 10*3/uL (ref 150–400)
RBC: 5.06 MIL/uL (ref 3.87–5.11)
RDW: 14.9 % (ref 11.5–15.5)
WBC: 7 10*3/uL (ref 4.0–10.5)
nRBC: 0 % (ref 0.0–0.2)

## 2019-10-26 LAB — BASIC METABOLIC PANEL
Anion gap: 12 (ref 5–15)
BUN: 11 mg/dL (ref 8–23)
CO2: 25 mmol/L (ref 22–32)
Calcium: 8.8 mg/dL — ABNORMAL LOW (ref 8.9–10.3)
Chloride: 100 mmol/L (ref 98–111)
Creatinine, Ser: 1.01 mg/dL — ABNORMAL HIGH (ref 0.44–1.00)
GFR calc Af Amer: >60 mL/min (ref >60)
GFR calc non Af Amer: 56 mL/min — ABNORMAL LOW (ref >60)
Glucose, Bld: 114 mg/dL — ABNORMAL HIGH (ref 70–99)
Potassium: 4.6 mmol/L (ref 3.5–5.1)
Sodium: 137 mmol/L (ref 135–145)

## 2019-10-26 LAB — TROPONIN I (HIGH SENSITIVITY): Troponin I (High Sensitivity): <2 ng/L (ref <18)

## 2019-10-26 IMAGING — CR DG CHEST 2V
2 series · 2 of 2 positions shown · non-contrast
Comparison: [DATE]

CLINICAL DATA: Shortness of breath, wheezing

EXAM:
CHEST - 2 VIEW

[chest pa]
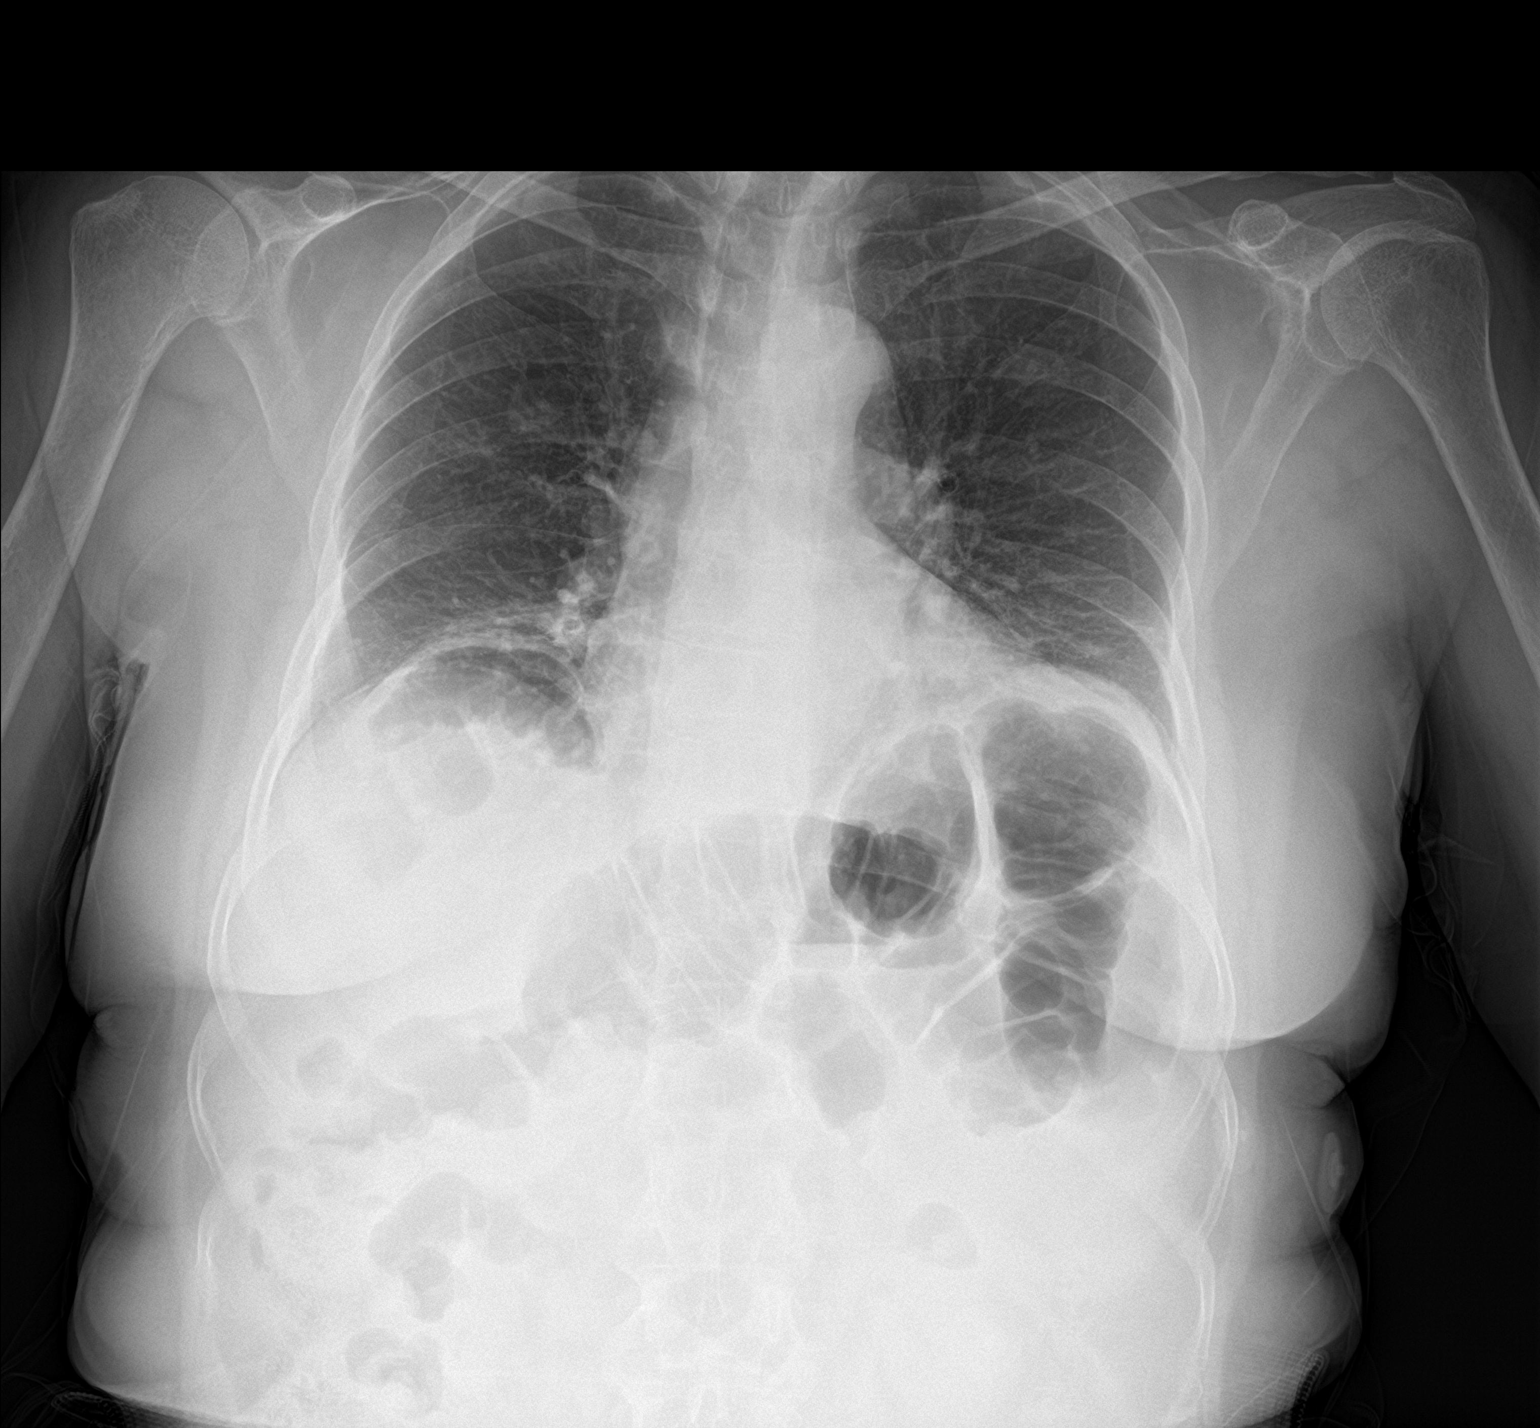

[chest lat]
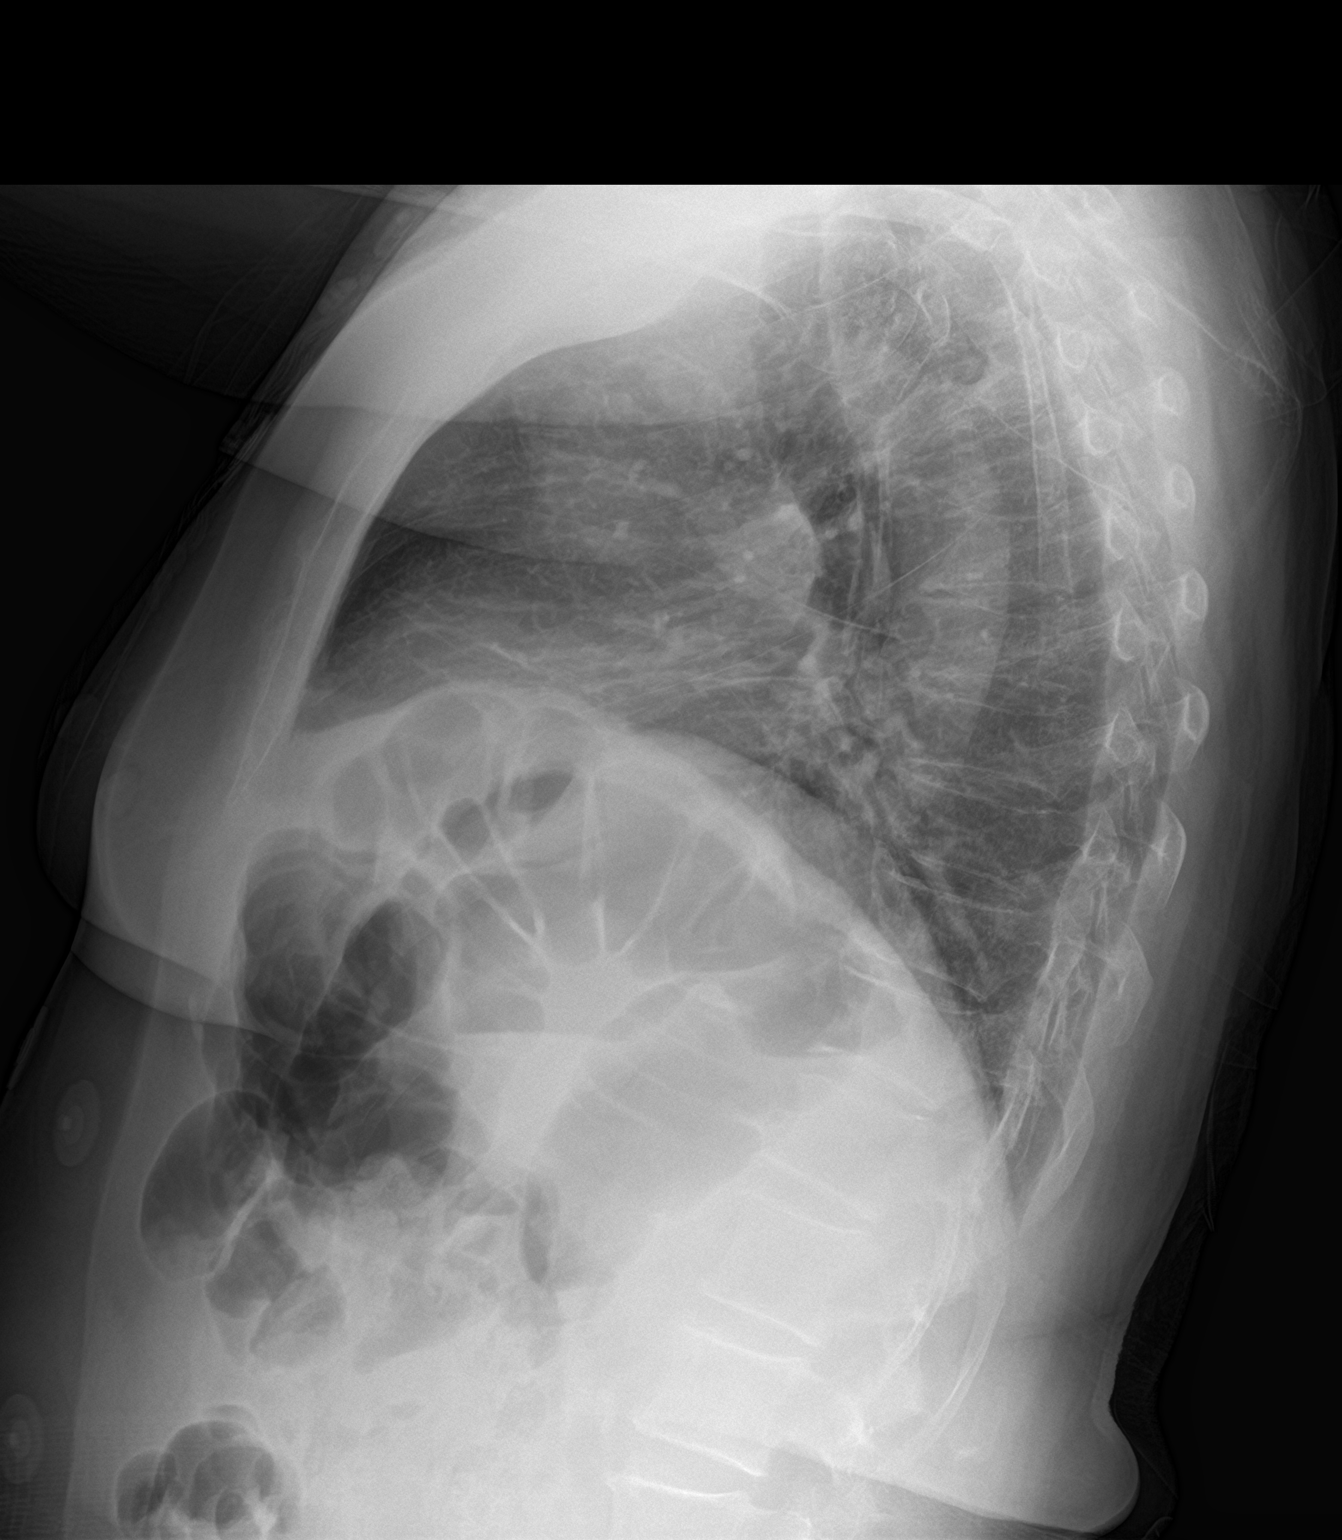

[2 of 2 positions shown; findings below may reference images not displayed]

FINDINGS: The heart size and mediastinal contours are within normal limits.
Low lung volumes with streaky bibasilar opacities, favor
atelectasis. No pleural effusion or pneumothorax. Gaseous distention
of the colon within the visualized abdomen. The visualized skeletal
structures are unremarkable.
IMPRESSION: 1. Low lung volumes with streaky bibasilar opacities, favor
atelectasis.
2. Gaseous distention of the colon within the visualized abdomen.

## 2019-10-26 MED ORDER — ALBUTEROL SULFATE HFA 108 (90 BASE) MCG/ACT IN AERS
2.0000 | INHALATION_SPRAY | Freq: Once | RESPIRATORY_TRACT | Status: AC
  Administered 2019-10-26: 2 via RESPIRATORY_TRACT
  Filled 2019-10-26: qty 6.7

## 2019-10-26 MED ORDER — SODIUM CHLORIDE 0.9% FLUSH: 3.0000 mL | Freq: Once | INTRAVENOUS | Status: DC

## 2019-10-26 MED ORDER — OMEPRAZOLE 20 MG PO CPDR
20.0000 mg | DELAYED_RELEASE_CAPSULE | Freq: Every day | ORAL | 0 refills | Status: DC
Start: 2019-10-26 — End: 2019-11-15

## 2019-10-26 NOTE — ED Provider Notes (Signed)
[facility] Provider Note   CSN: [id number] Arrival date & time: 10/26/19  1752     History Chief Complaint  Patient presents with  . Shortness of Breath    [facility] is a 74 y.o. female.  Patient is a 74 year old female with stroke several months ago requiring intubation and since that time has had sore throat, dysphagia, recent diagnosis of bronchitis presenting the ED with continued symptoms that have not worsened.  Patient and family deny fevers or other infectious symptoms.  ABCs intact.  No difficulty tolerating secretions or swallowing.  Patient is following closely with speech therapy.  Family is mostly concerned that she is unable to sleep due to her cough as well as globus sensation and nasal drainage.  The history is provided by the patient, a relative and medical records. The history is limited by a language barrier. A language interpreter was used.  Illness Location:  Throat Quality:  Sore Severity:  Mild Onset quality:  Gradual Timing:  Constant Progression:  Unchanged Chronicity:  Chronic Context:  Stroke earlier in the year, intubation, residual dysphagia, recently diagnosed bronchitis Relieved by:  Nothing Worsened by:  Nothing Ineffective treatments:  Over-the-counter medications Associated symptoms: cough and sore throat   Associated symptoms: no abdominal pain, no chest pain, no fever, no nausea, no shortness of breath and no vomiting        Past Medical History:  Diagnosis Date  . Hypertension   . Stroke ([place])   . Thyroid disease     Patient Active Problem List   Diagnosis Date Noted  . Renal failure 07/25/2019  . Prediabetes 07/25/2019  . 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 Dysphagia 07/25/2019  . 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 Acute respiratory failure ([place])   . Tachycardia-bradycardia syndrome ([place])   . Atrial fibrillation with RVR ([place])   . Hypertension 07/23/2015  . Hyperlipidemia 07/23/2015  . 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 Hypothyroidism 01/25/2014    Past Surgical History:  Procedure Laterality Date  . IR CT HEAD LTD  07/20/2019  . IR PERCUTANEOUS ART THROMBECTOMY/INFUSION INTRACRANIAL INC DIAG ANGIO  07/20/2019  . RADIOLOGY WITH ANESTHESIA N/A 07/20/2019   Procedure: IR WITH ANESTHESIA;  Surgeon: Luanne Bras, MD;  Location: [facility];  Service: Radiology;  Laterality: N/A;     OB History   No obstetric history on file.     Family History  Family history unknown: Yes    Social History   Tobacco Use  . Smoking status: Never Smoker  . Smokeless tobacco: Never Used  Substance Use Topics  . Alcohol use: No  . Drug use: No    Home Medications Prior to Admission medications   Medication Sig Start Date End Date Taking? Authorizing Provider  albuterol (VENTOLIN HFA) 108 (90 Base) MCG/ACT inhaler Inhale 1-2 puffs into the lungs every 6 (six) hours as needed for wheezing or shortness of breath. 10/09/19  Yes Jaynee Eagles, PA-C  amiodarone (PACERONE) 200 MG tablet Take 1 tablet (200 mg total) by mouth daily. 09/14/19  Yes Donato Heinz, MD  apixaban (ELIQUIS) 5 MG TABS tablet Take 1 tablet (5 mg total) by mouth 2 (two) times daily. 10/02/19  Yes Evans Lance, MD  aspirin EC 81 MG tablet Take 81 mg by mouth daily.  Yes [provider]  atorvastatin (LIPITOR) 40 MG tablet Take 1 tablet (40 mg total) by mouth daily. 08/02/19  Yes Angiulli, Lavon Paganini, PA-C  cetirizine (ZYRTEC ALLERGY) 10 MG tablet Take 1 tablet (10 mg total) by mouth daily. 10/13/19  Yes Jaynee Eagles, PA-C  levothyroxine (SYNTHROID) 88 MCG tablet Take 1 tablet (88 mcg total) by mouth daily before breakfast. 09/08/19  Yes Newlin, Enobong, MD  omeprazole (PRILOSEC) 20 MG capsule Take 1  capsule (20 mg total) by mouth daily. 10/26/19   Little, Wenda Overland, MD  famotidine (PEPCID) 20 MG tablet Take 1 tablet (20 mg total) by mouth 2 (two) times daily. Patient not taking: Reported on 10/24/2019 10/13/19 10/26/19  Jaynee Eagles, PA-C    Allergies    Patient has no known allergies.  Review of Systems   Review of Systems  Constitutional: Negative for fever.  HENT: Positive for sore throat.   Respiratory: Positive for cough. Negative for shortness of breath.   Cardiovascular: Negative for chest pain.  Gastrointestinal: Negative for abdominal pain, nausea and vomiting.  All other systems reviewed and are negative.   Physical Exam Updated Vital Signs BP (!) 143/90 (BP Location: Right Arm)   Pulse (!) 58   Temp 98.1 F (36.7 C) (Oral)   Resp 18   SpO2 99%   Physical Exam Vitals and nursing note reviewed.  Constitutional:      General: She is not in acute distress.    Appearance: She is well-developed.  HENT:     Head: Normocephalic and atraumatic.  Eyes:     Conjunctiva/sclera: Conjunctivae normal.  Cardiovascular:     Rate and Rhythm: Normal rate and regular rhythm.     Heart sounds: No murmur.  Pulmonary:     Effort: Pulmonary effort is normal. No tachypnea, accessory muscle usage or respiratory distress.     Breath sounds: Normal breath sounds. No stridor. No decreased breath sounds, wheezing, rhonchi or rales.     Comments: Patient has transmitted upper airway sounds, lungs are otherwise clear, no wheezing. Abdominal:     Palpations: Abdomen is soft.     Tenderness: There is no abdominal tenderness.  Musculoskeletal:     Cervical back: Neck supple.  Skin:    General: Skin is warm and dry.  Neurological:     General: No focal deficit present.     Mental Status: She is alert and oriented to person, place, and time.  Psychiatric:        Mood and Affect: Mood normal.        Behavior: Behavior normal.     ED Results / Procedures / Treatments   Labs (all  labs ordered are listed, but only abnormal results are displayed) Labs Reviewed  BASIC METABOLIC PANEL - Abnormal; Notable for the following components:      Result Value   Glucose, Bld 114 (*)    Creatinine, Ser 1.01 (*)    Calcium 8.8 (*)    GFR calc non Af Amer 56 (*)    All other components within normal limits  CBC  TROPONIN I (HIGH SENSITIVITY)  TROPONIN I (HIGH SENSITIVITY)    EKG None  Radiology DG Chest 2 View  Result Date: 10/26/2019 CLINICAL DATA:  Shortness of breath, wheezing EXAM: CHEST - 2 VIEW COMPARISON:  10/09/2019 FINDINGS: The heart size and mediastinal contours are within normal limits. Low lung volumes with streaky bibasilar opacities, favor atelectasis. No pleural effusion or pneumothorax. Gaseous distention of the colon within the  visualized abdomen. The visualized skeletal structures are unremarkable. IMPRESSION: 1. Low lung volumes with streaky bibasilar opacities, favor atelectasis. 2. Gaseous distention of the colon within the visualized abdomen. Electronically Signed   By: Davina Poke D.O.   On: 10/26/2019 18:55    Procedures Procedures (including critical care time)  Medications Ordered in ED Medications  albuterol (VENTOLIN HFA) 108 (90 Base) MCG/ACT inhaler 2 puff (2 puffs Inhalation Given 10/26/19 2027)    ED Course  I have reviewed the triage vital signs and the nursing notes.  Pertinent labs & imaging results that were available during my care of the patient were reviewed by me and considered in my medical decision making (see chart for details).    MDM Rules/Calculators/A&P                     Differential diagnosis: Bronchitis, dysphagia, sequela of stroke, cough, rhinorrhea, URI  ED physician interpretation of imaging: Chest x-ray the focal pneumonia, widened mediastinum, pneumothorax ED physician interpretation of EKG: No STEMI.  Normal sinus rhythm ED physician interpretation of labs: Patient CBC, BMP, 1 troponin unremarkable, no  critical values  MDM: Patient is a 74 year old female presenting in the ED with sore throat, likely chronic in nature and multifactorial in etiology due to relatively recent intubation, stroke, dysphagia, bronchitis with unremarkable fiberoptic scope recently by ENT.  Patient vital signs are stable, patient afebrile.  Patient's physical exam is unremarkable.  ABCs are intact, no airway concerns.  Patient has not been taking her famotidine, this may be a GERD complication, omeprazole prescription written as well and patient instructed to take both medications and follow-up closely with her primary care physician.  Diagnosis, treatment and plan of care was discussed and agreed upon with patient.  Patient comfortable with discharge at this time.   Key discharge instructions: Start taking your famotidine as well as your new omeprazole prescription.  Follow close with PCP.  Continue over-the-counter medications including sertraline.   Final Clinical Impression(s) / ED Diagnoses Final diagnoses:  Sore throat    Rx / DC Orders ED Discharge Orders         Ordered    omeprazole (PRILOSEC) 20 MG capsule  Daily     10/26/19 2132           Delma Post, MD 10/27/19 1404    Little, Wenda Overland, MD 10/29/19 1158

## 2019-10-26 NOTE — Telephone Encounter (Signed)
Patient got a reminder call for an upcoming heart cath procedure. The appointment is on Monday but she is still having some Bronchitis symptoms that have not resolved. She will need to reschedule the procedure.  She would like someone from the office to call her in [place]

## 2019-10-26 NOTE — ED Triage Notes (Signed)
Pt arrives to ED w/ c/o sob. Pt wheezing on auscultation. Pt recently diagnosed w/ bronchitis, tachypnea in triage. SPO2 98%.

## 2019-10-26 NOTE — Telephone Encounter (Signed)
Call returned to Pt with assistance from interpreter.  Call went to VM.  Requested Pt call office.

## 2019-10-27 ENCOUNTER — Other Ambulatory Visit (HOSPITAL_COMMUNITY): Payer: Self-pay

## 2019-10-27 DIAGNOSIS — R1310 Dysphagia, unspecified: Secondary | ICD-10-CM

## 2019-10-27 DIAGNOSIS — R059 Cough, unspecified: Secondary | ICD-10-CM

## 2019-10-30 ENCOUNTER — Other Ambulatory Visit: Payer: Self-pay

## 2019-10-30 ENCOUNTER — Inpatient Hospital Stay (HOSPITAL_COMMUNITY)
Admission: EM | Admit: 2019-10-30 | Discharge: 2019-11-01 | DRG: 156 | Disposition: A | Discharge status: Home / Self Care | Payer: Medicare Other | Attending: Family Medicine | Admitting: Family Medicine

## 2019-10-30 ENCOUNTER — Other Ambulatory Visit: Payer: Medicare Other

## 2019-10-30 ENCOUNTER — Other Ambulatory Visit (HOSPITAL_COMMUNITY): Payer: Medicare Other

## 2019-10-30 ENCOUNTER — Observation Stay (HOSPITAL_COMMUNITY): Payer: Medicare Other

## 2019-10-30 ENCOUNTER — Ambulatory Visit: Payer: Medicare Other

## 2019-10-30 ENCOUNTER — Emergency Department (HOSPITAL_COMMUNITY): Payer: Medicare Other

## 2019-10-30 ENCOUNTER — Encounter (HOSPITAL_COMMUNITY): Payer: Self-pay | Admitting: Family Medicine

## 2019-10-30 ENCOUNTER — Ambulatory Visit (INDEPENDENT_AMBULATORY_CARE_PROVIDER_SITE_OTHER)
Admission: EM | Admit: 2019-10-30 | Discharge: 2019-10-30 | Disposition: A | Discharge status: Home / Self Care | Payer: Medicare Other | Source: Home / Self Care

## 2019-10-30 DIAGNOSIS — J984 Other disorders of lung: Secondary | ICD-10-CM | POA: Diagnosis not present

## 2019-10-30 DIAGNOSIS — J384 Edema of larynx: Secondary | ICD-10-CM | POA: Diagnosis not present

## 2019-10-30 DIAGNOSIS — Z7901 Long term (current) use of anticoagulants: Secondary | ICD-10-CM

## 2019-10-30 DIAGNOSIS — Z20822 Contact with and (suspected) exposure to covid-19: Secondary | ICD-10-CM | POA: Diagnosis not present

## 2019-10-30 DIAGNOSIS — I10 Essential (primary) hypertension: Secondary | ICD-10-CM | POA: Diagnosis present

## 2019-10-30 DIAGNOSIS — Z79899 Other long term (current) drug therapy: Secondary | ICD-10-CM

## 2019-10-30 DIAGNOSIS — I4891 Unspecified atrial fibrillation: Secondary | ICD-10-CM | POA: Diagnosis present

## 2019-10-30 DIAGNOSIS — I63512 Cerebral infarction due to unspecified occlusion or stenosis of left middle cerebral artery: Secondary | ICD-10-CM | POA: Diagnosis present

## 2019-10-30 DIAGNOSIS — E038 Other specified hypothyroidism: Secondary | ICD-10-CM | POA: Diagnosis not present

## 2019-10-30 DIAGNOSIS — R0603 Acute respiratory distress: Secondary | ICD-10-CM

## 2019-10-30 DIAGNOSIS — R1310 Dysphagia, unspecified: Secondary | ICD-10-CM

## 2019-10-30 DIAGNOSIS — I4811 Longstanding persistent atrial fibrillation: Secondary | ICD-10-CM | POA: Diagnosis not present

## 2019-10-30 DIAGNOSIS — I69320 Aphasia following cerebral infarction: Secondary | ICD-10-CM

## 2019-10-30 DIAGNOSIS — E785 Hyperlipidemia, unspecified: Secondary | ICD-10-CM | POA: Diagnosis present

## 2019-10-30 DIAGNOSIS — E7800 Pure hypercholesterolemia, unspecified: Secondary | ICD-10-CM

## 2019-10-30 DIAGNOSIS — R0602 Shortness of breath: Secondary | ICD-10-CM | POA: Diagnosis present

## 2019-10-30 DIAGNOSIS — Z950 Presence of cardiac pacemaker: Secondary | ICD-10-CM

## 2019-10-30 DIAGNOSIS — J386 Stenosis of larynx: Secondary | ICD-10-CM | POA: Diagnosis present

## 2019-10-30 DIAGNOSIS — Z79890 Hormone replacement therapy (postmenopausal): Secondary | ICD-10-CM

## 2019-10-30 DIAGNOSIS — Z7982 Long term (current) use of aspirin: Secondary | ICD-10-CM

## 2019-10-30 DIAGNOSIS — R061 Stridor: Secondary | ICD-10-CM | POA: Diagnosis present

## 2019-10-30 DIAGNOSIS — E039 Hypothyroidism, unspecified: Secondary | ICD-10-CM | POA: Diagnosis present

## 2019-10-30 DIAGNOSIS — Z8673 Personal history of transient ischemic attack (TIA), and cerebral infarction without residual deficits: Secondary | ICD-10-CM

## 2019-10-30 HISTORY — DX: Unspecified atrial fibrillation: I48.91

## 2019-10-30 LAB — COMPREHENSIVE METABOLIC PANEL
ALT: 23 U/L (ref 0–44)
AST: 26 U/L (ref 15–41)
Albumin: 3.7 g/dL (ref 3.5–5.0)
Alkaline Phosphatase: 101 U/L (ref 38–126)
Anion gap: 12 (ref 5–15)
BUN: 6 mg/dL — ABNORMAL LOW (ref 8–23)
CO2: 26 mmol/L (ref 22–32)
Calcium: 8.7 mg/dL — ABNORMAL LOW (ref 8.9–10.3)
Chloride: 96 mmol/L — ABNORMAL LOW (ref 98–111)
Creatinine, Ser: 0.88 mg/dL (ref 0.44–1.00)
GFR calc Af Amer: >60 mL/min (ref >60)
GFR calc non Af Amer: >60 mL/min (ref >60)
Glucose, Bld: 140 mg/dL — ABNORMAL HIGH (ref 70–99)
Potassium: 3.9 mmol/L (ref 3.5–5.1)
Sodium: 134 mmol/L — ABNORMAL LOW (ref 135–145)
Total Bilirubin: 1.2 mg/dL (ref 0.3–1.2)
Total Protein: 7.8 g/dL (ref 6.5–8.1)

## 2019-10-30 LAB — CBC WITH DIFFERENTIAL/PLATELET
Abs Immature Granulocytes: 0.02 10*3/uL (ref 0.00–0.07)
Basophils Absolute: 0 10*3/uL (ref 0.0–0.1)
Basophils Relative: 0 %
Eosinophils Absolute: 0 10*3/uL (ref 0.0–0.5)
Eosinophils Relative: 0 %
HCT: 45.9 % (ref 36.0–46.0)
Hemoglobin: 14.8 g/dL (ref 12.0–15.0)
Immature Granulocytes: 0 %
Lymphocytes Relative: 35 %
Lymphs Abs: 3.2 10*3/uL (ref 0.7–4.0)
MCH: 29 pg (ref 26.0–34.0)
MCHC: 32.2 g/dL (ref 30.0–36.0)
MCV: 89.8 fL (ref 80.0–100.0)
Monocytes Absolute: 0.7 10*3/uL (ref 0.1–1.0)
Monocytes Relative: 8 %
Neutro Abs: 5.1 10*3/uL (ref 1.7–7.7)
Neutrophils Relative %: 57 %
Platelets: 327 10*3/uL (ref 150–400)
RBC: 5.11 MIL/uL (ref 3.87–5.11)
RDW: 14.5 % (ref 11.5–15.5)
WBC: 9.1 10*3/uL (ref 4.0–10.5)
nRBC: 0 % (ref 0.0–0.2)

## 2019-10-30 LAB — PROTIME-INR
INR: 1.2 (ref 0.8–1.2)
Prothrombin Time: 15.1 seconds (ref 11.4–15.2)

## 2019-10-30 LAB — SARS CORONAVIRUS 2 BY RT PCR (HOSPITAL ORDER, PERFORMED IN ~~LOC~~ HOSPITAL LAB): SARS Coronavirus 2: NEGATIVE

## 2019-10-30 IMAGING — DX DG CHEST 1V PORT
1 series · 1 of 1 positions shown · non-contrast
Comparison: [DATE]

CLINICAL DATA: Short of breath.

EXAM:
PORTABLE CHEST 1 VIEW

[chest ap]
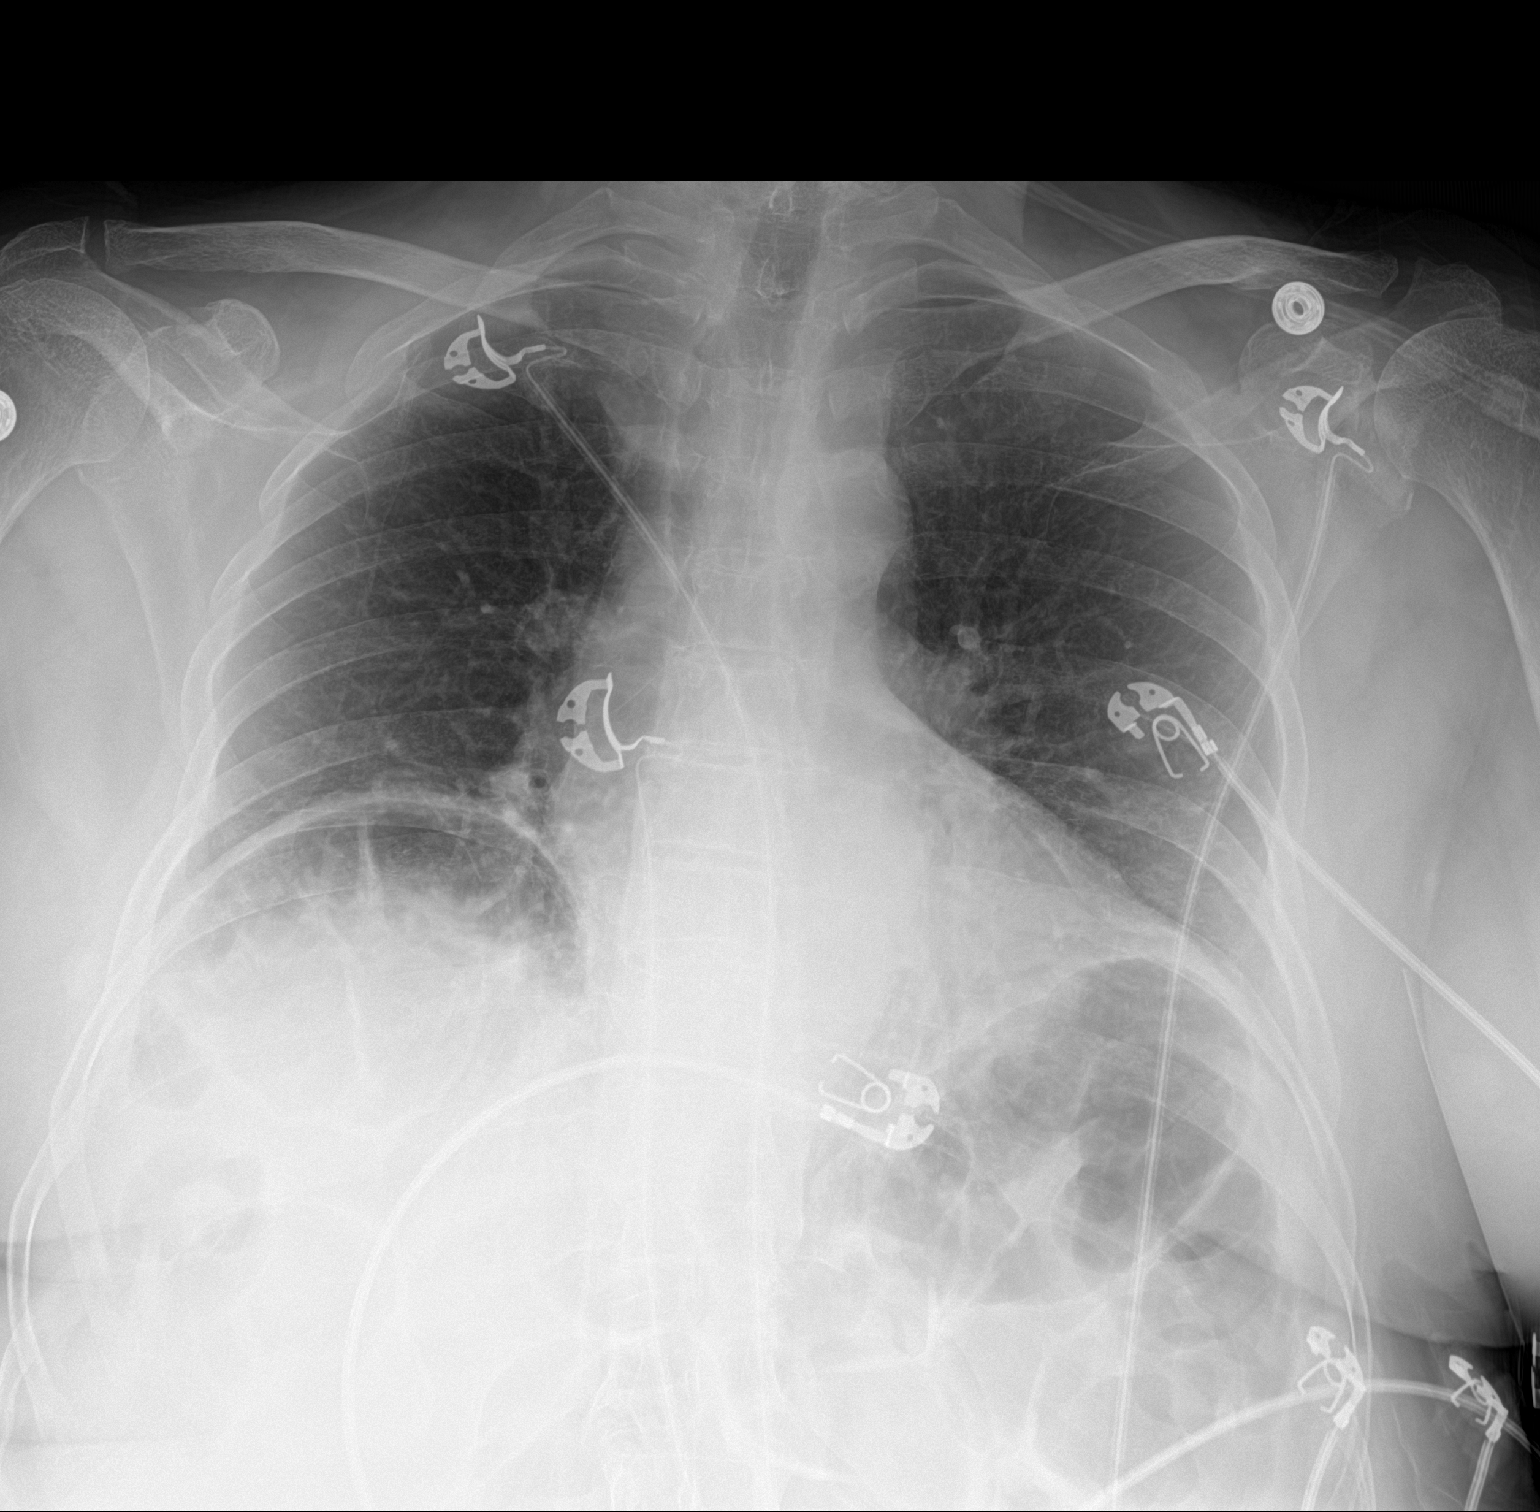

[1 of 1 positions shown; findings below may reference images not displayed]

FINDINGS: Cardiac silhouette is normal in size. No mediastinal or hilar
masses. Lung volumes are low. Mild lung base opacity noted
consistent with atelectasis. Lungs are otherwise clear.

No pleural effusion or pneumothorax.

There is generalized increased bowel gas.

Skeletal structures are demineralized but grossly intact.
IMPRESSION: No acute cardiopulmonary disease.

## 2019-10-30 IMAGING — CT CT NECK W/ CM
3 of 4 series · 12 of 33 positions shown, 14 images · IV contrast (omnipaque)
Comparison: No pertinent prior studies available for comparison.

CLINICAL DATA: Epiglottitis or tonsillitis suspected. Additional
history provided: Difficulty breathing, difficulty swallowing and
change in voice.

EXAM:
CT NECK WITH CONTRAST
TECHNIQUE: Multidetector CT imaging of the neck was performed using the
standard protocol following the bolus administration of intravenous
contrast.
CONTRAST:  75mL OMNIPAQUE IOHEXOL 300 MG/ML  SOLN

[Series 6: sagittal · sagittal · 0.44mm/px · 5 of 82 slices shown, 6 images]
[im 28/82  bone]
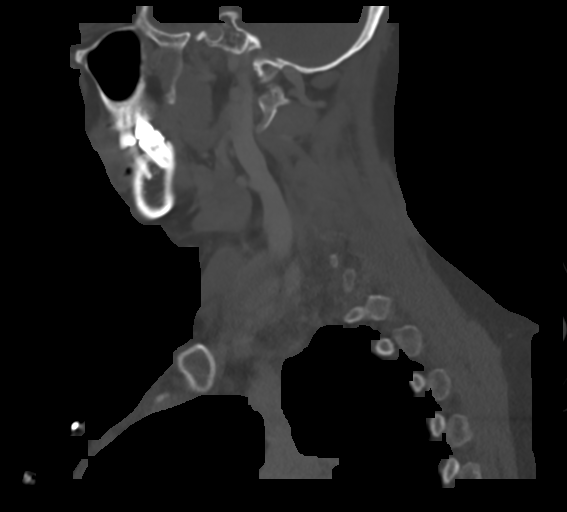
[im 34/82  bone]
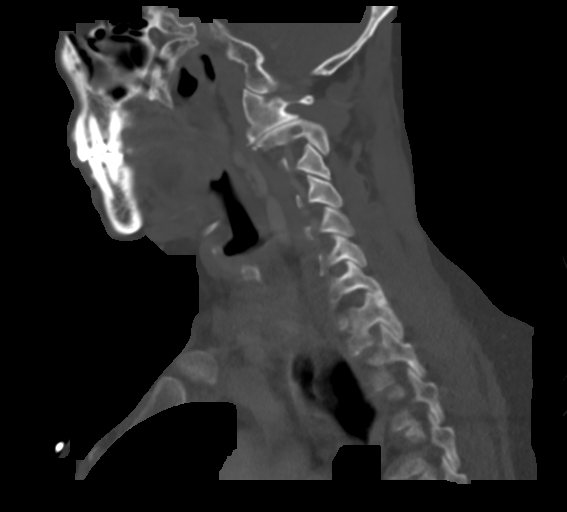
[im 41/82  soft-tissue]
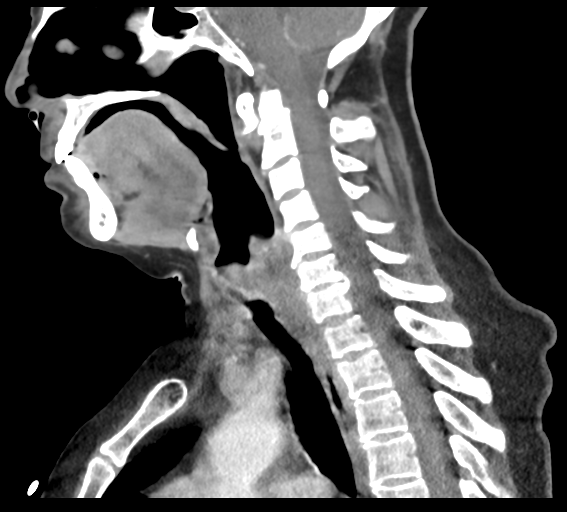
[im 41/82  bone]
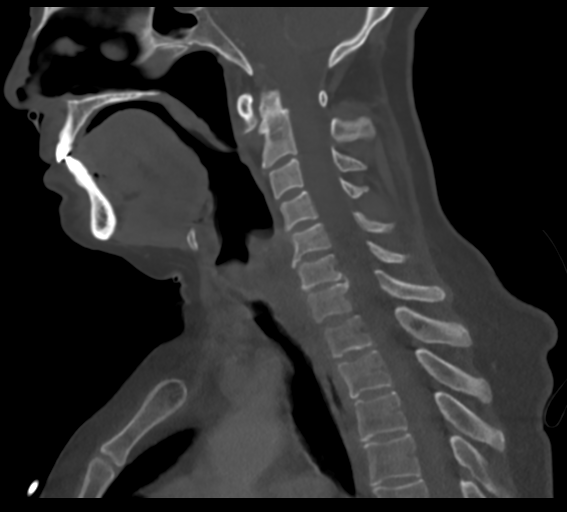
[im 48/82  bone]
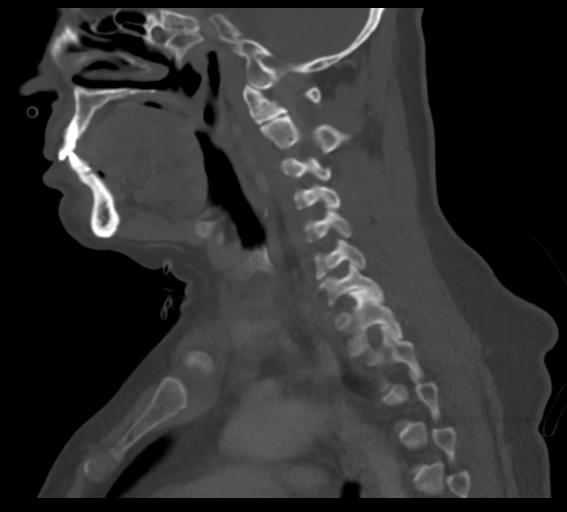
[im 55/82  bone]
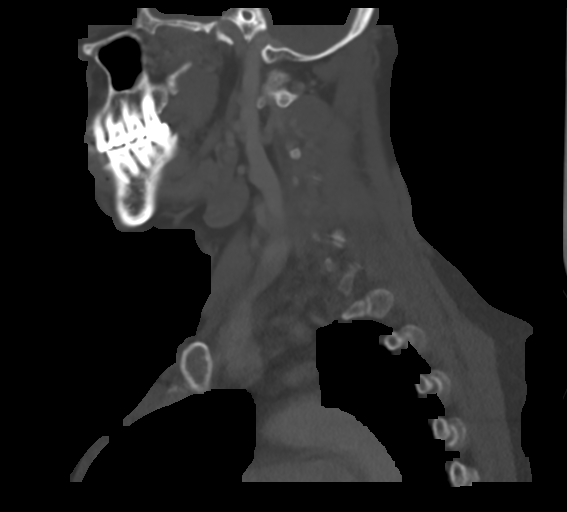

[Series 7: coronal · coronal · 0.37mm/px · 3 of 119 slices shown]
[im 24/119  bone]
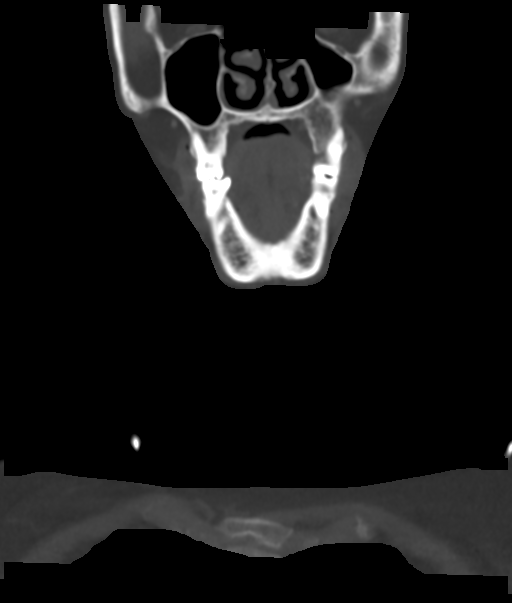
[im 48/119  bone]
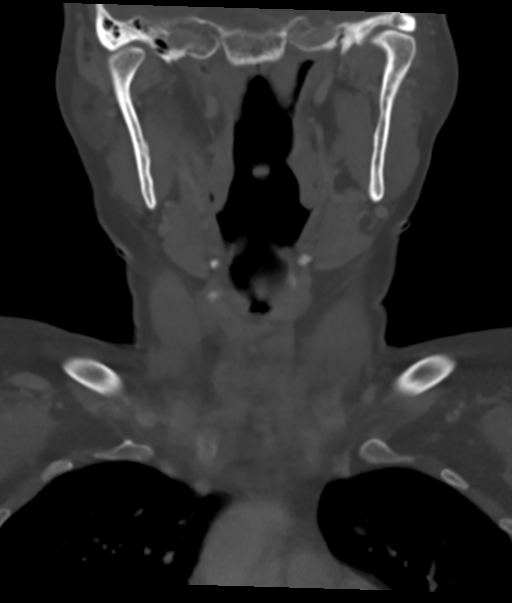
[im 71/119  bone]
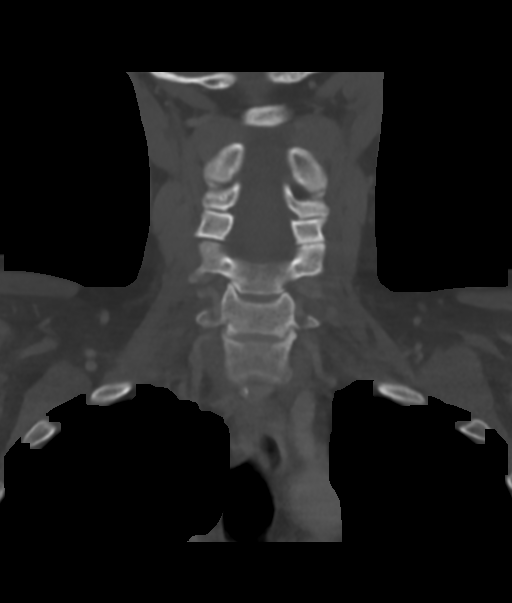

[Series 8: orthogonal · axial · 0.32mm/px · z∈[-398,-206]mm · 4 of 143 slices shown, 5 images]
[im 21/143  soft-tissue]
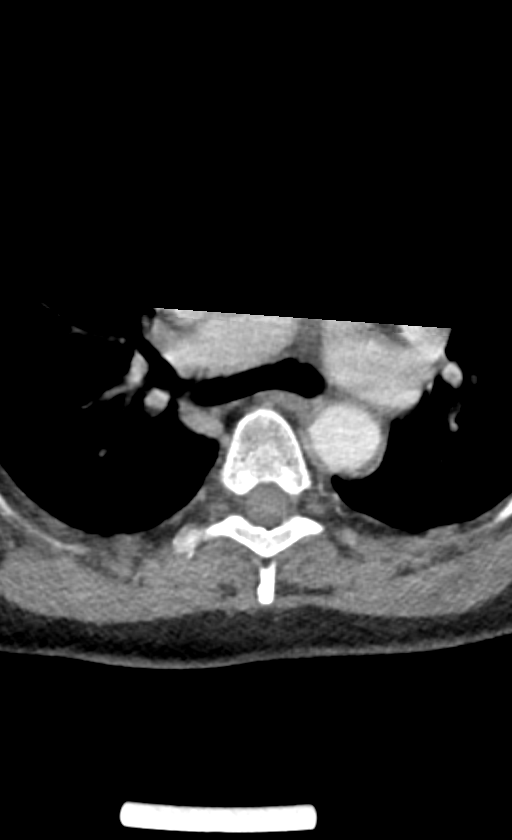
[im 21/143  bone]
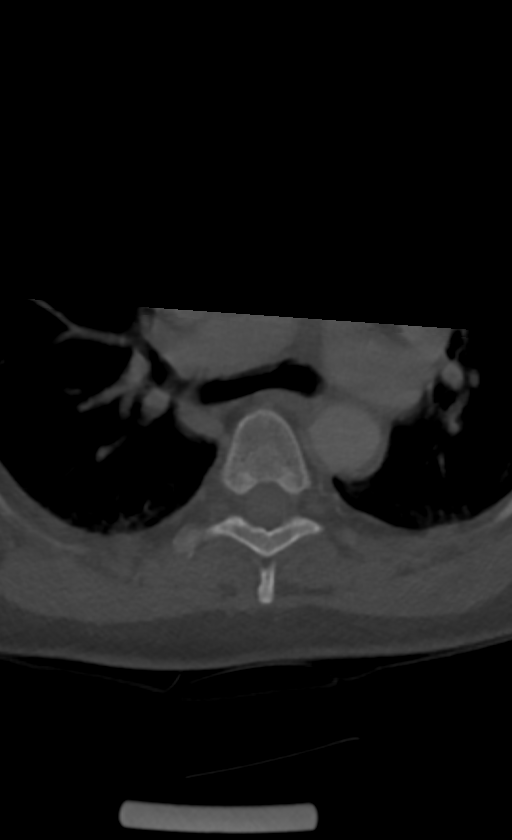
[im 61/143  bone]
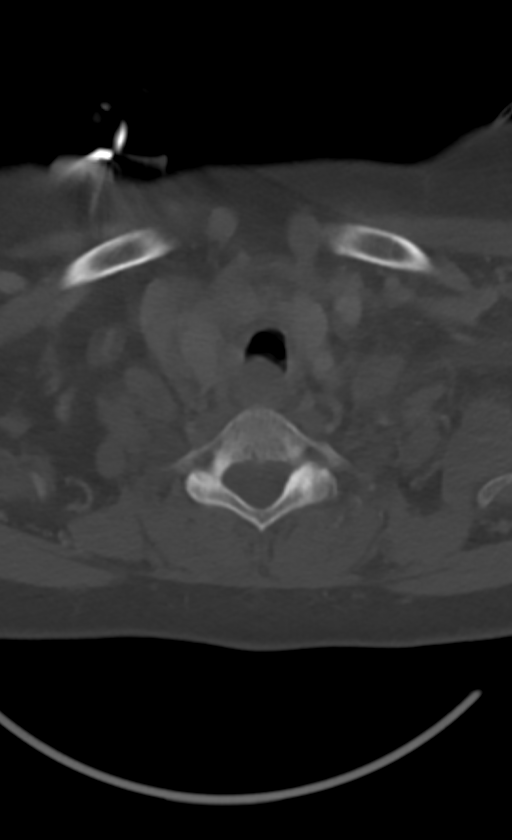
[im 82/143  bone]
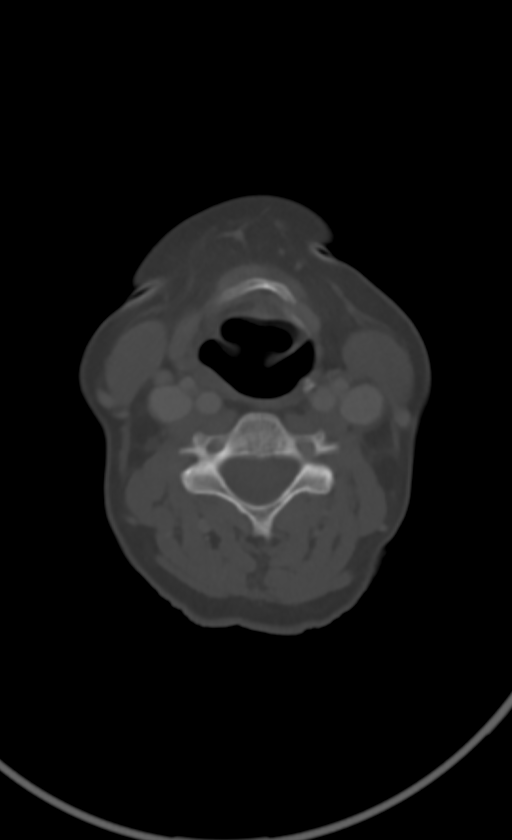
[im 122/143  bone]
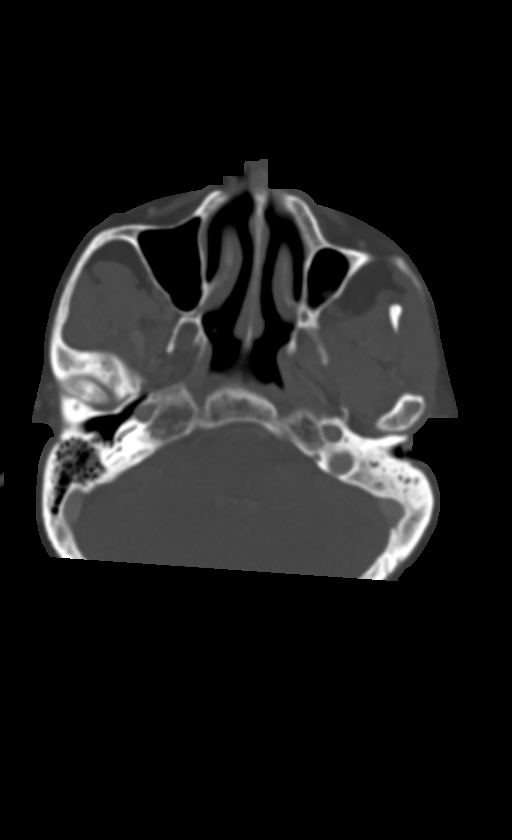

[12 of 33 positions shown; findings below may reference images not displayed]

FINDINGS: The examination is mildly motion degraded, particularly at the level
of the mid to lower neck and upper chest.

Pharynx and larynx: No swelling or discrete mass is identified
within the oral cavity or pharynx. Postinflammatory calcifications
of the right palatine tonsil. There is nonspecific soft tissue
prominence with possible edema at the level of the glottis without
definite discrete mass identified. Apparent moderate airway
narrowing at this level.

Salivary glands: No inflammation, mass, or stone.

Thyroid: No discrete thyroid mass is identified, although motion
degradation limits evaluation.

Lymph nodes: No discrete

Vascular: The major vascular structures of the neck are patent.

Limited intracranial: No acute abnormality identified.

Visualized orbits: Incompletely imaged. Visualized orbits show no
acute finding.

Mastoids and visualized paranasal sinuses: Trace ethmoid sinus
mucosal thickening. Tiny left maxillary sinus mucous retention cyst.
Left middle ear/mastoid effusion.

Skeleton: Nonspecific 8 mm lucent lesion within the left aspect of
the T5 vertebral body. Mild cervical spondylosis.

Upper chest: No consolidation within the imaged lung apices.
IMPRESSION: Examination degraded by mild motion artifact, particularly at the
level of the mid to lower neck and upper chest.

There is nonspecific soft tissue prominence with possible edema at
the level of the glottis without definite discrete mass identified.
Direct visualization should be considered for further evaluation.
Apparent moderate airway narrowing at this level.

Left middle ear/mastoid effusion.

Trace ethmoid sinus mucosal thickening. Tiny left maxillary sinus
mucous retention cyst.

Nonspecific 8 mm lucent lesion within the T5 vertebral body.

## 2019-10-30 MED ORDER — ATORVASTATIN CALCIUM 40 MG PO TABS
40.0000 mg | ORAL_TABLET | Freq: Every evening | ORAL | Status: DC
  Administered 2019-10-30 – 2019-10-31 (×2): 40 mg via ORAL
  Filled 2019-10-30 (×2): qty 1

## 2019-10-30 MED ORDER — SODIUM CHLORIDE 0.9 % IV SOLN: INTRAVENOUS | Status: DC

## 2019-10-30 MED ORDER — PANTOPRAZOLE SODIUM 40 MG PO TBEC
40.0000 mg | DELAYED_RELEASE_TABLET | Freq: Every day | ORAL | Status: DC
  Administered 2019-10-31 – 2019-11-01 (×2): 40 mg via ORAL
  Filled 2019-10-30 (×2): qty 1

## 2019-10-30 MED ORDER — HEPARIN (PORCINE) 25000 UT/250ML-% IV SOLN
950.0000 [IU]/h | INTRAVENOUS | Status: DC
  Administered 2019-10-30: 1000 [IU]/h via INTRAVENOUS
  Filled 2019-10-30: qty 250

## 2019-10-30 MED ORDER — LORATADINE 10 MG PO TABS
10.0000 mg | ORAL_TABLET | Freq: Every day | ORAL | Status: DC
  Administered 2019-10-31 – 2019-11-01 (×2): 10 mg via ORAL
  Filled 2019-10-30 (×2): qty 1

## 2019-10-30 MED ORDER — PHENOL 1.4 % MT LIQD
1.0000 | OROMUCOSAL | Status: DC | PRN
  Administered 2019-10-31: 1 via OROMUCOSAL
  Filled 2019-10-30: qty 177

## 2019-10-30 MED ORDER — EPINEPHRINE 0.3 MG/0.3ML IJ SOAJ
0.3000 mg | Freq: Once | INTRAMUSCULAR | Status: AC
  Administered 2019-10-30: 0.3 mg via INTRAMUSCULAR

## 2019-10-30 MED ORDER — METHYLPREDNISOLONE SODIUM SUCC 125 MG IJ SOLR
125.0000 mg | Freq: Once | INTRAMUSCULAR | Status: AC
  Administered 2019-10-30: 125 mg via INTRAVENOUS

## 2019-10-30 MED ORDER — HEPARIN BOLUS VIA INFUSION
2000.0000 [IU] | Freq: Once | INTRAVENOUS | Status: AC
  Administered 2019-10-30: 2000 [IU] via INTRAVENOUS
  Filled 2019-10-30: qty 2000

## 2019-10-30 MED ORDER — IOHEXOL 300 MG/ML  SOLN
75.0000 mL | Freq: Once | INTRAMUSCULAR | Status: AC | PRN
  Administered 2019-10-30: 75 mL via INTRAVENOUS

## 2019-10-30 MED ORDER — ALBUTEROL SULFATE (2.5 MG/3ML) 0.083% IN NEBU
5.0000 mg | INHALATION_SOLUTION | RESPIRATORY_TRACT | Status: DC
  Administered 2019-10-30: 5 mg via RESPIRATORY_TRACT

## 2019-10-30 MED ORDER — APIXABAN 5 MG PO TABS
5.0000 mg | ORAL_TABLET | Freq: Two times a day (BID) | ORAL | Status: DC
  Filled 2019-10-30: qty 1

## 2019-10-30 MED ORDER — LEVOTHYROXINE SODIUM 88 MCG PO TABS
88.0000 ug | ORAL_TABLET | Freq: Every day | ORAL | Status: DC
  Administered 2019-10-31 – 2019-11-01 (×2): 88 ug via ORAL
  Filled 2019-10-30 (×2): qty 1

## 2019-10-30 MED ORDER — ALBUTEROL SULFATE (2.5 MG/3ML) 0.083% IN NEBU
INHALATION_SOLUTION | RESPIRATORY_TRACT | Status: AC
  Filled 2019-10-30: qty 6

## 2019-10-30 MED ORDER — METHYLPREDNISOLONE SODIUM SUCC 125 MG IJ SOLR
INTRAMUSCULAR | Status: AC
  Filled 2019-10-30: qty 2

## 2019-10-30 MED ORDER — ASPIRIN EC 81 MG PO TBEC
81.0000 mg | DELAYED_RELEASE_TABLET | Freq: Every day | ORAL | Status: DC
  Administered 2019-10-31: 81 mg via ORAL
  Filled 2019-10-30: qty 1

## 2019-10-30 MED ORDER — ALBUTEROL SULFATE (2.5 MG/3ML) 0.083% IN NEBU
3.0000 mL | INHALATION_SOLUTION | Freq: Four times a day (QID) | RESPIRATORY_TRACT | Status: DC | PRN
  Administered 2019-10-30: 3 mL via RESPIRATORY_TRACT
  Filled 2019-10-30: qty 3

## 2019-10-30 MED ORDER — RACEPINEPHRINE HCL 2.25 % IN NEBU: 0.5000 mL | INHALATION_SOLUTION | Freq: Once | RESPIRATORY_TRACT | Status: DC

## 2019-10-30 MED ORDER — AMIODARONE HCL 200 MG PO TABS
200.0000 mg | ORAL_TABLET | Freq: Every day | ORAL | Status: DC
  Administered 2019-10-31 – 2019-11-01 (×2): 200 mg via ORAL
  Filled 2019-10-30 (×2): qty 1

## 2019-10-30 NOTE — Consult Note (Signed)
Reason for Consult:stridor Referring Physician: er  [facility] is an 74 y.o. female.  HPI: With a history of stroke about 3 months ago and has had some dysphagia ever since.  Her history is hard to obtain but she does have what sounds like a week or less of stridor.  She has been evaluated by otolaryngology last week but no note is in the chart to determine what was found.  She has presented to the emergency room with stridor and some respiratory distress but now since being here she is feeling much better.  She is having less stridor and has only a mild problem.  She actually saw Dr. Radene Journey last month.  Past Medical History:  Diagnosis Date   Hypertension    Stroke [facility])    Thyroid disease     Past Surgical History:  Procedure Laterality Date   IR CT HEAD LTD  07/20/2019   IR PERCUTANEOUS ART THROMBECTOMY/INFUSION INTRACRANIAL INC DIAG ANGIO  07/20/2019   RADIOLOGY WITH ANESTHESIA N/A 07/20/2019   Procedure: IR WITH ANESTHESIA;  Surgeon: Luanne Bras, MD;  Location: [facility];  Service: Radiology;  Laterality: N/A;    Family History  Family history unknown: Yes    Social History:  reports that she has never smoked. She has never used smokeless tobacco. She reports that she does not drink alcohol or use drugs.  Allergies: No Known Allergies  Medications: I have reviewed the patient's current medications.  Results for orders placed or performed during the hospital encounter of 10/30/19 (from the past 48 hour(s))  CBC with Differential     Status: None   Collection Time: 10/30/19  9:13 AM  Result Value Ref Range   WBC 9.1 4.0 - 10.5 K/uL   RBC 5.11 3.87 - 5.11 MIL/uL   Hemoglobin 14.8 12.0 - 15.0 g/dL   HCT 45.9 36.0 - 46.0 %   MCV 89.8 80.0 - 100.0 fL   MCH 29.0 26.0 - 34.0 pg   MCHC 32.2 30.0 - 36.0 g/dL   RDW 14.5 11.5 - 15.5 %   Platelets 327 150 - 400 K/uL   nRBC 0.0 0.0 - 0.2 %   Neutrophils Relative % 57 %   Neutro Abs 5.1 1.7 - 7.7 K/uL    Lymphocytes Relative 35 %   Lymphs Abs 3.2 0.7 - 4.0 K/uL   Monocytes Relative 8 %   Monocytes Absolute 0.7 0.1 - 1.0 K/uL   Eosinophils Relative 0 %   Eosinophils Absolute 0.0 0.0 - 0.5 K/uL   Basophils Relative 0 %   Basophils Absolute 0.0 0.0 - 0.1 K/uL   Immature Granulocytes 0 %   Abs Immature Granulocytes 0.02 0.00 - 0.07 K/uL    Comment: Performed at [facility], [address], [place], [place] [address]    No results found.  Review of Systems Blood pressure (!) 130/118, pulse 84, temperature (!) 97.4 F (36.3 C), temperature source Temporal, resp. rate (!) 22, SpO2 100 %. Physical Exam  Constitutional: She appears well-developed.  She speaks Spanish and through an interpreter history was obtained.  The interpreter could not get good answers for many of the questions because she was not given her the answer in a rational fashion.  HENT:  Head: Normocephalic and atraumatic.  Nose: Nose normal.  Mouth/Throat: Oropharynx is clear and moist.  Fiberoptic exam-the nasopharynx is clear.  Oral cavity/oropharynx at the posterior aspect looks clear.  Epiglottis is normal.  Both vocal cords move normally  and look normal.  There is obvious subglottic swelling with the right side being fairly significant.  It does not look granulation type nor does it look like a tumor.  She clearly does have narrowing that would explain the stridor.  Eyes: Pupils are equal, round, and reactive to light. Conjunctivae are normal.  Neck:  No mass or lesion in the neck.    Assessment/Plan: Stridor-fiberoptic exam reveals that she does have subglottic swelling.  More on the right than the left.  She probably should undergo a CT scan to evaluate the extent but she needs Dr. Lucia Gaskins to come continue to follow-up since it is his patient for a more defined plan for care of the subglottic problem.  She should be admitted for observation for now to get her stable.  [name] 10/30/2019, 10:22 AM

## 2019-10-30 NOTE — Plan of Care (Signed)
  Problem: Education: Goal: Knowledge of General Education information will improve Description: Including pain rating scale, medication(s)/side effects and non-pharmacologic comfort measures 10/30/2019 2332 by Loma Messing, RN Outcome: Progressing 10/30/2019 2331 by Loma Messing, RN Outcome: Not Progressing   Problem: Health Behavior/Discharge Planning: Goal: Ability to manage health-related needs will improve 10/30/2019 2332 by Loma Messing, RN Outcome: Progressing 10/30/2019 2331 by Karsten Fells D, RN Outcome: Not Progressing   Problem: Clinical Measurements: Goal: Ability to maintain clinical measurements within normal limits will improve 10/30/2019 2332 by Loma Messing, RN Outcome: Progressing 10/30/2019 2331 by Karsten Fells D, RN Outcome: Not Progressing Goal: Will remain free from infection 10/30/2019 2332 by Karsten Fells D, RN Outcome: Progressing 10/30/2019 2331 by Loma Messing, RN Outcome: Not Progressing Goal: Diagnostic test results will improve 10/30/2019 2332 by Loma Messing, RN Outcome: Progressing 10/30/2019 2331 by Karsten Fells D, RN Outcome: Not Progressing Goal: Respiratory complications will improve 10/30/2019 2332 by Loma Messing, RN Outcome: Progressing 10/30/2019 2331 by Karsten Fells D, RN Outcome: Not Progressing Goal: Cardiovascular complication will be avoided 10/30/2019 2332 by Karsten Fells D, RN Outcome: Progressing 10/30/2019 2331 by Loma Messing, RN Outcome: Not Progressing   Problem: Activity: Goal: Risk for activity intolerance will decrease 10/30/2019 2332 by Karsten Fells D, RN Outcome: Progressing 10/30/2019 2331 by Loma Messing, RN Outcome: Not Progressing   Problem: Nutrition: Goal: Adequate nutrition will be maintained 10/30/2019 2332 by Loma Messing, RN Outcome: Progressing 10/30/2019 2331 by Karsten Fells D, RN Outcome: Not Progressing   Problem: Elimination: Goal:  Will not experience complications related to bowel motility 10/30/2019 2332 by Loma Messing, RN Outcome: Progressing 10/30/2019 2331 by Loma Messing, RN Outcome: Not Progressing Goal: Will not experience complications related to urinary retention 10/30/2019 2332 by Loma Messing, RN Outcome: Progressing 10/30/2019 2331 by Karsten Fells D, RN Outcome: Not Progressing   Problem: Skin Integrity: Goal: Risk for impaired skin integrity will decrease 10/30/2019 2332 by Loma Messing, RN Outcome: Progressing 10/30/2019 2331 by Loma Messing, RN Outcome: Not Progressing

## 2019-10-30 NOTE — Telephone Encounter (Signed)
Call placed to Pt with interpreter.  Call went to VM.  Requested Pt call office.  Pt is scheduled for lab work and covid test today in preparation for pacemaker implant.

## 2019-10-30 NOTE — ED Triage Notes (Addendum)
Pt in respiratory distress, RN and NP at bedside, 911 called for EMS transport.

## 2019-10-30 NOTE — Progress Notes (Signed)
ANTICOAGULATION CONSULT NOTE - Follow Up Consult  Pharmacy Consult for Heparin (Eliquis on hold) Indication: stroke history / Afib No Known Allergies  Patient Measurements: Weight: 68 kg (150 lb)  Vital Signs: Temp: 98.1 F (36.7 C) (06/07 1720) Temp Source: Temporal (06/07 0917) BP: 130/71 (06/07 1720) Pulse Rate: 77 (06/07 1720)  Labs: Recent Labs    10/30/19 0913  HGB 14.8  HCT 45.9  PLT 327  LABPROT 15.1  INR 1.2  CREATININE 0.88    Estimated Creatinine Clearance: 49.2 mL/min (by C-G formula based on SCr of 0.88 mg/dL).  Assessment: 74 year old female s/p CVA 3 months ago admitted with SOB and dysphagia.  Prior to admission Eliquis for Afib to be transitioned to heparin in case she needs a procedure, last dose of Eliquis reported at 8 pm 6/6  Goal of Therapy:  aPTT 66 to 102 seconds seconds Monitor platelets by anticoagulation protocol: Yes  Heparin level 0.3-0.7   Plan:  Heparin 2000 units iv bolus x 1 Heparin drip at 1000 units / hr Heparin level and PTT 8 hours after heparin starts Daily heparin level, CBC, PTT  Thank you Anette Guarneri, PharmD 10/30/2019,7:17 PM

## 2019-10-30 NOTE — ED Notes (Signed)
Pt taken to ER by EMS, report called to ER Charge RN Janett Billow

## 2019-10-30 NOTE — ED Provider Notes (Signed)
[facility] EMERGENCY DEPARTMENT Provider Note   CSN: [id number] Arrival date & time: 10/30/19  4403     History Chief Complaint  Patient presents with  . Shortness of Breath    Richard Esquivel [name] is a 74 y.o. female.  HPI    74 year old female with history of hypertension, stroke, thyroid disease comes in a chief complaint of shortness of breath.  Patient transferred from urgent care because of respiratory extremis.  She arrives via EMS with nonrebreather oxygen therapy and she is already received Solu-Medrol.  According to the patient, she started having severe shortness of breath last night.  She was unable to sleep, and her family took her to the urgent care for it.  Patient is having difficulty in breathing, difficulty in swallowing and change in her voice.  She has actually had some respiratory issues for the last 3 months, ever since she had a stroke.   Patient has history of bronchitis.  She denies any history of COPD, asthma.  She denies any cardiac history.  Patient has cough that has been nonproductive.  Translation service was utilized.  Past Medical History:  Diagnosis Date  . Hypertension   . Stroke ([place])   . Thyroid disease     Patient Active Problem List   Diagnosis Date Noted  . Renal failure 07/25/2019  . Prediabetes 07/25/2019  . 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 Dysphagia 07/25/2019  . 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 Acute respiratory failure ([place])   . Tachycardia-bradycardia syndrome ([place])   . Atrial fibrillation with RVR ([place])   . Hypertension 07/23/2015  . Hyperlipidemia 07/23/2015  . 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 Hypothyroidism 01/25/2014    Past Surgical History:  Procedure Laterality Date  . IR CT HEAD LTD  07/20/2019   . IR PERCUTANEOUS ART THROMBECTOMY/INFUSION INTRACRANIAL INC DIAG ANGIO  07/20/2019  . RADIOLOGY WITH ANESTHESIA N/A 07/20/2019   Procedure: IR WITH ANESTHESIA;  Surgeon: Luanne Bras, MD;  Location: [facility];  Service: Radiology;  Laterality: N/A;     OB History   No obstetric history on file.     Family History  Family history unknown: Yes    Social History   Tobacco Use  . Smoking status: Never Smoker  . Smokeless tobacco: Never Used  Substance Use Topics  . Alcohol use: No  . Drug use: No    Home Medications Prior to Admission medications   Medication Sig Start Date End Date Taking? Authorizing Provider  albuterol (VENTOLIN HFA) 108 (90 Base) MCG/ACT inhaler Inhale 1-2 puffs into the lungs every 6 (six) hours as needed for wheezing or shortness of breath. 10/09/19   Jaynee Eagles, PA-C  amiodarone (PACERONE) 200 MG tablet Take 1 tablet (200 mg total) by mouth daily. 09/14/19   Donato Heinz, MD  apixaban (ELIQUIS) 5 MG TABS tablet Take 1 tablet (5 mg total) by mouth 2 (two) times daily. 10/02/19   Evans Lance, MD  aspirin EC 81 MG tablet Take 81 mg by mouth daily.    [provider]  atorvastatin (LIPITOR) 40 MG tablet Take 1 tablet (40 mg total) by mouth daily. 08/02/19   Angiulli, Lavon Paganini, PA-C  cetirizine (ZYRTEC ALLERGY) 10 MG tablet Take 1 tablet (10 mg total) by mouth daily. 10/13/19   Jaynee Eagles, PA-C  levothyroxine (SYNTHROID) 88 MCG tablet Take 1 tablet (88 mcg total) by mouth daily before breakfast. 09/08/19   Charlott Rakes, MD  omeprazole (PRILOSEC) 20 MG capsule Take 1 capsule (20 mg total) by mouth daily. 10/26/19   Little, Wenda Overland, MD  famotidine (PEPCID) 20 MG tablet Take 1 tablet (20 mg total) by mouth 2 (two) times daily. Patient not taking: Reported on 10/24/2019 10/13/19 10/26/19  Jaynee Eagles, PA-C    Allergies    Patient has no known allergies.  Review of Systems   Review of Systems  Constitutional: Positive for activity change.   HENT: Positive for trouble swallowing and voice change.   Respiratory: Positive for cough, shortness of breath, wheezing and stridor.   Cardiovascular: Negative for chest pain.  Gastrointestinal: Negative for nausea and vomiting.  Allergic/Immunologic: Negative for immunocompromised state.  All other systems reviewed and are negative.   Physical Exam Updated Vital Signs BP (!) 130/118   Pulse 84   Temp (!) 97.4 F (36.3 C) (Temporal)   Resp (!) 22   SpO2 100%   Physical Exam Vitals and nursing note reviewed.  Constitutional:      General: She is in acute distress.     Appearance: She is well-developed.     Comments: Acute respiratory distress  HENT:     Head: Normocephalic and atraumatic.  Cardiovascular:     Rate and Rhythm: Normal rate.  Pulmonary:     Effort: Tachypnea and respiratory distress present.     Breath sounds: Stridor present. Wheezing present. No decreased breath sounds.  Abdominal:     General: Bowel sounds are normal.  Musculoskeletal:     Cervical back: Normal range of motion and neck supple.  Skin:    General: Skin is warm and dry.  Neurological:     Mental Status: She is alert and oriented to person, place, and time.     ED Results / Procedures / Treatments   Labs (all labs ordered are listed, but only abnormal results are displayed) Labs Reviewed  COMPREHENSIVE METABOLIC PANEL - Abnormal; Notable for the following components:      Result Value   Sodium 134 (*)    Chloride 96 (*)    Glucose, Bld 140 (*)    BUN 6 (*)    Calcium 8.7 (*)    All other components within normal limits  SARS CORONAVIRUS 2 BY RT PCR (HOSPITAL ORDER, [place] [facility])  CBC WITH DIFFERENTIAL/PLATELET  PROTIME-INR  POC SARS CORONAVIRUS 2 AG -  ED    EKG None  Radiology DG Chest Port 1 View  Result Date: 10/30/2019 CLINICAL DATA:  Short of breath. EXAM: PORTABLE CHEST 1 VIEW COMPARISON:  10/26/2019 FINDINGS: Cardiac silhouette is normal in  size. No mediastinal or hilar masses. Lung volumes are low. Mild lung base opacity noted consistent with atelectasis. Lungs are otherwise clear. No pleural effusion or pneumothorax. There is generalized increased bowel gas. Skeletal structures are demineralized but grossly intact. IMPRESSION: No acute cardiopulmonary disease. Electronically Signed   By: Lajean Manes M.D.   On: 10/30/2019 10:36    Procedures .Critical Care Performed by: Varney Biles, MD Authorized by: Varney Biles, MD   Critical care provider statement:    Critical care time (minutes):  48   Critical care was necessary to treat or prevent imminent or life-threatening deterioration of the following conditions:  Respiratory failure   Critical care was time spent personally by me on the following activities:  Discussions  with consultants, evaluation of patient's response to treatment, examination of patient, ordering and performing treatments and interventions, ordering and review of laboratory studies, ordering and review of radiographic studies, pulse oximetry, re-evaluation of patient's condition, obtaining history from patient or surrogate and review of old charts   (including critical care time)  Medications Ordered in ED Medications  Racepinephrine HCl 2.25 % nebulizer solution 0.5 mL (has no administration in time range)  EPINEPHrine (EPI-PEN) injection 0.3 mg (0.3 mg Intramuscular Given 10/30/19 4650)    ED Course  I have reviewed the triage vital signs and the nursing notes.  Pertinent labs & imaging results that were available during my care of the patient were reviewed by me and considered in my medical decision making (see chart for details).    MDM Rules/Calculators/A&P                      74 year old female comes in a chief complaint of shortness of breath. She is noted to be in respiratory distress at arrival.  She is tachypneic, having stridulous breath sounds and wheezing.  She also has hoarse voice  and having trouble completing sentences without having to stop.  Lungs do sound clear.  No signs of volume overload.  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  We gave patient EpiPen, racemic epi.  She had received Solu-Medrol already.  We have held up Benadryl as we do not think there is hypersensitivity going on.  Patient symptoms have been present for at least 3 weeks and she has been getting swallow evaluation as well.  Upon further chart review it appears that patient was seen in the ER just few days ago for same complaints.  ENT bedside scope allegedly was normal.  I do not see any ENT formal note.  I discussed this with Dr. Janace Hoard, ENT who we have consulted.  He has decided to proceed with the scope to get a better knowledge of patient's condition.  CT soft tissue neck is also been ordered.  Reassessment: Dr. Janace Hoard has completed his assessment.  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  Patient is stable however, and he recommends admission to the hospital with followed by Dr. Melony Overly, who allegedly patient had seen the patient in the clinic before.  Dr. Lucia Gaskins will follow up with the patient once she is admitted.  He thinks pulmonary consult might help.  Results of the ED work-up has been discussed with the patient and her son.  They are in agreement with the plan.  Medicine has been consulted for admission.  Final Clinical Impression(s) / ED Diagnoses Final diagnoses:  Subglottic edema    Rx / DC Orders ED Discharge Orders    None       Varney Biles, MD 10/30/19 1113

## 2019-10-30 NOTE — H&P (Addendum)
[facility] Admission History and Physical Service Pager: [phone]  Patient name: Jamie Mitchell Medical record number: [id number] Date of birth: May 23, 1946 Age: 74 y.o. Gender: female  Primary Care Provider: Charlott Rakes, MD Consultants: ENT Code Status: Full Preferred Emergency Contact: [name] [phone]  Chief Complaint: Shortness of breath and difficulty swallowing  Assessment and Plan: Jamie Mitchell is a 74 y.o. female presenting with shortness of breath. 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  Shortness of breath/difficulty swallowing/subglottic swelling Patient presented from the urgent care in respiratory extremis.  On arrival to the emergency department she was on a nonrebreather and had received Solu-Medrol and epinephrine.  She reportedly had shortness of breath the night before and was unable to sleep so she went to urgent care.  Urgent care told the patient to be evaluated in the emergency department.  She also reported difficulty swallowing. She reports that these respiratory issues have been going on for approximately 3 months, since she had her stroke.  Patient has had multiple trips to the ER over the last few weeks for similar complaints.  Dr. Janace Hoard, ENT was consulted by the ED.  He recommended scope to better assess the patient's condition.  He also recommended a CT soft tissue neck.  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  CT soft tissue showed nonspecific tissue prominence with possible edema at the level of the glottis without definite discrete mass identified ... apparent moderate airway narrowing at that level.  CBC was within normal limits.  Patient followed by Dr. Melony Overly.  He was contacted and says that he will follow up with the patient when she is admitted.  On evaluation patient is in no acute  respiratory distress.  She has a large amount of upper airway noise on respiratory exam but is satting well on room air, 2 L nasal cannula was added for increased work of breathing.  Source of this edema could be related to damage caused by intubation after stroke 3 months ago.  Infectious process is less likely given patient denies any recent illnesses or infectious symptoms. -Admit to observation with Dr. Owens Shark as attending -ENT consulted, appreciate recommendations -Dr. Lucia Gaskins will come to see patient when she is admitted -N.p.o. except sips with meds until speech eval for swallow study -Monitor for respiratory status changes -Consult speech for swallow study -Maintenance IV fluids until no longer n.p.o. -Every 4 vitals overnight -Protonix 40 mg daily -PT/OT  Left MCA stroke s/p TPA with thrombectomy Had stroke approximately 3 months ago and has had dysphagia ever since.  No other focal neurologic deficits noted.  Patient currently on Eliquis 5 mg daily and aspirin 81 mg daily. -Hold home meds in case patient needs procedure -Heparin drip  Atrial fibrillation Home medications include Eliquis 5 mg twice daily, aspirin 81 mg daily, amiodarone 200 mg daily. -Hold home Eliquis, aspirin -Amiodarone 200 mg daily -Heparin drip per pharmacy -Continuous cardiac monitoring  Hypothyroidism Home medications include Synthroid 88 mcg daily -Continue home Synthroid dose  Hypertension  Patient has no home medications listed.  Blood pressure since arrival have ranged from 127/69-180/117.  Most recent blood pressure was 126/85 -Monitor blood pressures -Consider antihypertensive  FEN/GI: N.p.o. except sips with meds Prophylaxis: Heparin drip  Disposition: Admit to observation  History of Present Illness:  Jamie Mitchell is a 74 y.o. female presenting with shortness of breath and difficulty swallowing   Patient  is seen with her Son in the ED, he speaks for her since she has difficulty  speaking.  A translator is also used given both patient speak Spanish Andree Moro [phone]).  Son says "my mom has some breathing problems since 3 weeks". The doctor said she has bronchitis and gave her some meds but she is not doing any better.  She was then seen and told that she had an issue with her epiglottis but that she needs a scan for that.  They report that the scan was already scheduled by the oropharyngeal doctor but she has not had time to make it yet.  Son reports that 3 weeks ago she had increased work of breathing, she went to the ED and was doing better, she came back last Thursday top the ED. patient son reports that he feels she is doing better right now since she is on oxygen but with ambulation she becomes short of breath.  She also has coughing.  She complains of issues for the past few months with eating or swallowing liquids or solids.  Reports she coughs until she throws up.  Review Of Systems: Per HPI with the following additions:  Review of Systems  Constitutional: Negative for fever.  HENT: Positive for sore throat. Negative for congestion.   Respiratory: Positive for cough.   Cardiovascular: Negative for leg swelling.  Gastrointestinal: Positive for nausea. Negative for abdominal pain and diarrhea.       Post tussive emesis  Genitourinary: Negative for difficulty urinating.  Neurological: Positive for speech difficulty and weakness. Negative for dizziness. Light-headedness:      Patient Active Problem List   Diagnosis Date Noted  . SOB (shortness of breath) 10/30/2019  . Shortness of breath 10/30/2019  . Renal failure 07/25/2019  . Prediabetes 07/25/2019  . 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 Dysphagia 07/25/2019  . 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 Acute respiratory failure ([place])   .  Tachycardia-bradycardia syndrome ([place])   . Atrial fibrillation with RVR ([place])   . Hypertension 07/23/2015  . Hyperlipidemia 07/23/2015  . 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 Hypothyroidism 01/25/2014    Past Medical History: Past Medical History:  Diagnosis Date  . Hypertension   . Stroke ([place])   . Thyroid disease     Past Surgical History: Past Surgical History:  Procedure Laterality Date  . IR CT HEAD LTD  07/20/2019  . IR PERCUTANEOUS ART THROMBECTOMY/INFUSION INTRACRANIAL INC DIAG ANGIO  07/20/2019  . RADIOLOGY WITH ANESTHESIA N/A 07/20/2019   Procedure: IR WITH ANESTHESIA;  Surgeon: Luanne Bras, MD;  Location: [facility];  Service: Radiology;  Laterality: N/A;    Social History: Social History   Tobacco Use  . Smoking status: Never Smoker  . Smokeless tobacco: Never Used  Substance Use Topics  . Alcohol use: No  . Drug use: No   Additional social history:  Please also refer to relevant sections of EMR.  Family History: No significant family history  Allergies and Medications: No Known Allergies No current facility-administered medications on file prior to encounter.   Current Outpatient Medications on File Prior to Encounter  Medication Sig Dispense Refill  . albuterol (VENTOLIN HFA) 108 (90 Base) MCG/ACT inhaler Inhale 1-2 puffs into the lungs every 6 (six) hours as needed for wheezing or shortness of breath. 18 g 0  .  amiodarone (PACERONE) 200 MG tablet Take 1 tablet (200 mg total) by mouth daily. 30 tablet 5  . apixaban (ELIQUIS) 5 MG TABS tablet Take 1 tablet (5 mg total) by mouth 2 (two) times daily. 90 tablet 3  . aspirin EC 81 MG tablet Take 81 mg by mouth daily.    [name] atorvastatin (LIPITOR) 40 MG tablet Take 1 tablet (40 mg total) by mouth daily. (Patient taking differently: 40 mg every evening. ) 30 tablet 0  . cetirizine (ZYRTEC ALLERGY) 10 MG tablet Take 1 tablet (10 mg total) by mouth daily. 30 tablet 0  . levothyroxine  (SYNTHROID) 88 MCG tablet Take 1 tablet (88 mcg total) by mouth daily before breakfast. 30 tablet 3  . omeprazole (PRILOSEC) 20 MG capsule Take 1 capsule (20 mg total) by mouth daily. 14 capsule 0  . [DISCONTINUED] famotidine (PEPCID) 20 MG tablet Take 1 tablet (20 mg total) by mouth 2 (two) times daily. (Patient not taking: Reported on 10/24/2019) 60 tablet 0    Objective: BP 130/71 (BP Location: Left Arm)   Pulse 77   Temp 98.1 F (36.7 C)   Resp (!) 25   SpO2 97%  Physical Exam  Constitutional: She is well-developed, well-nourished, and in no distress.  HENT:  Head: Normocephalic and atraumatic.  Eyes: Pupils are equal, round, and reactive to light. Conjunctivae and EOM are normal.  Neck: No tracheal deviation present. No thyromegaly present.  Cardiovascular: Normal rate, regular rhythm and normal heart sounds.  No murmur heard. Auscultation difficult secondary to patient stridor  Pulmonary/Chest: Effort normal. Stridor present. No respiratory distress.  Patient has a large amount of upper airway noise and congestion making it difficult to clearly hear breath sounds.  No wheezes or crackles noted.  Abdominal: Soft. Bowel sounds are normal. She exhibits no distension.  Musculoskeletal:        General: No deformity or edema.     Cervical back: Normal range of motion and neck supple.  Lymphadenopathy:    She has no cervical adenopathy.  Neurological: She is alert. No cranial nerve deficit. Coordination normal.  Skin: Skin is warm and dry. No rash noted. She is not diaphoretic. No erythema. No pallor.    Labs and Imaging: CBC BMET  Recent Labs  Lab 10/30/19 0913  WBC 9.1  HGB 14.8  HCT 45.9  PLT 327   Recent Labs  Lab 10/30/19 0913  NA 134*  K 3.9  CL 96*  CO2 26  BUN 6*  CREATININE 0.88  GLUCOSE 140*  CALCIUM 8.7*     CT Soft Tissue Neck W Contrast  Result Date: 10/30/2019 CLINICAL DATA:  Epiglottitis or tonsillitis suspected. Additional history provided:  Difficulty breathing, difficulty swallowing and change in voice. EXAM: CT NECK WITH CONTRAST TECHNIQUE: Multidetector CT imaging of the neck was performed using the standard protocol following the bolus administration of intravenous contrast. CONTRAST:  5mL OMNIPAQUE IOHEXOL 300 MG/ML  SOLN COMPARISON:  No pertinent prior studies available for comparison. FINDINGS: The examination is mildly motion degraded, particularly at the level of the mid to lower neck and upper chest. Pharynx and larynx: No swelling or discrete mass is identified within the oral cavity or pharynx. Postinflammatory calcifications of the right palatine tonsil. There is nonspecific soft tissue prominence with possible edema at the level of the glottis without definite discrete mass identified. Apparent moderate airway narrowing at this level. Salivary glands: No inflammation, mass, or stone. Thyroid: No discrete thyroid mass is identified, although motion degradation  limits evaluation. Lymph nodes: No discrete Vascular: The major vascular structures of the neck are patent. Limited intracranial: No acute abnormality identified. Visualized orbits: Incompletely imaged. Visualized orbits show no acute finding. Mastoids and visualized paranasal sinuses: Trace ethmoid sinus mucosal thickening. Tiny left maxillary sinus mucous retention cyst. Left middle ear/mastoid effusion. Skeleton: Nonspecific 8 mm lucent lesion within the left aspect of the T5 vertebral body. Mild cervical spondylosis. Upper chest: No consolidation within the imaged lung apices. IMPRESSION: Examination degraded by mild motion artifact, particularly at the level of the mid to lower neck and upper chest. There is nonspecific soft tissue prominence with possible edema at the level of the glottis without definite discrete mass identified. Direct visualization should be considered for further evaluation. Apparent moderate airway narrowing at this level. Left middle ear/mastoid  effusion. Trace ethmoid sinus mucosal thickening. Tiny left maxillary sinus mucous retention cyst. Nonspecific 8 mm lucent lesion within the T5 vertebral body. Electronically Signed   By: Kellie Simmering DO   On: 10/30/2019 13:05   DG Chest Port 1 View  Result Date: 10/30/2019 CLINICAL DATA:  Short of breath. EXAM: PORTABLE CHEST 1 VIEW COMPARISON:  10/26/2019 FINDINGS: Cardiac silhouette is normal in size. No mediastinal or hilar masses. Lung volumes are low. Mild lung base opacity noted consistent with atelectasis. Lungs are otherwise clear. No pleural effusion or pneumothorax. There is generalized increased bowel gas. Skeletal structures are demineralized but grossly intact. IMPRESSION: No acute cardiopulmonary disease. Electronically Signed   By: Lajean Manes M.D.   On: 10/30/2019 10:36   Gifford Shave, MD PGY-1, [facility] Intern pager: [phone], text pages welcome   FPTS Upper-Level Resident Addendum I have independently interviewed and examined the patient. I have discussed the above with the original author and agree with their documentation. My edits for correction/addition/clarification are in blue. Please see also any attending notes.    Milus Banister, DO PGY-2, [facility] 10/30/2019 5:43 PM  [facility] Service pager: [phone] (text pages welcome through [facility])

## 2019-10-30 NOTE — Hospital Course (Addendum)
Jamie Mitchell is a 74 y.o. female presenting with shortness of breath. 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  Below is her hospital course by problem list.  Subglottic edema with stridor Patient presented to the emergency department with shortness of breath and stridor.  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  She was given racemic epi in the emergency department.  She was kept n.p.o. overnight and her stridor improved.  ENT continue to follow after admission and reassessed her epiglottic swelling.  They recommended starting her on a steroid taper as well as antibiotics for 1 week.  She was also evaluated by speech who recommended an esophageal swallow study.  Esophagram swallow study showed gastroesophageal reflux with borderline appearance for nonspecific esophageal dysmotility, given the presence of proximal escape and initial secondary and tertiary contractions.  There was a small type I hiatal hernia and mild organoaxial rotation of the stomach in the upper abdomen.  Patient's respiratory status improved and stridor decreased.  Patient was discharged on a prednisone taper and Augmentin for 6 additional days.  She was scheduled with follow-up with her ENT.  They will be scheduling a repeat CT neck to evaluate for resolution of subglottic edema.  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  Her Eliquis and aspirin were held on admission out of concern that she may need a procedure.  Her Eliquis was restarted after it was determined she would not be going to the OR but it was determined that there was no need for her to continue her aspirin.

## 2019-10-30 NOTE — ED Triage Notes (Signed)
Pt bib ems from UC with difficulty breathing. Pt with hx respiratory failure per UC. Pt appears to be shob on arrival. Given 5mg  albuterol and 125mg  solumedrol. Pt spanish speaking. Interpreter at the bedside.

## 2019-10-30 NOTE — ED Notes (Signed)
Pt reports having difficulty breathing since her stroke. Worsened last night. Now feels like she is having trouble swallowing. States it feels like her throat is tight.

## 2019-10-31 ENCOUNTER — Other Ambulatory Visit: Payer: Self-pay | Admitting: *Deleted

## 2019-10-31 DIAGNOSIS — I4811 Longstanding persistent atrial fibrillation: Secondary | ICD-10-CM | POA: Diagnosis not present

## 2019-10-31 DIAGNOSIS — I4891 Unspecified atrial fibrillation: Secondary | ICD-10-CM

## 2019-10-31 DIAGNOSIS — K219 Gastro-esophageal reflux disease without esophagitis: Secondary | ICD-10-CM | POA: Diagnosis not present

## 2019-10-31 DIAGNOSIS — Z7982 Long term (current) use of aspirin: Secondary | ICD-10-CM | POA: Diagnosis not present

## 2019-10-31 DIAGNOSIS — Z79890 Hormone replacement therapy (postmenopausal): Secondary | ICD-10-CM | POA: Diagnosis not present

## 2019-10-31 DIAGNOSIS — J9620 Acute and chronic respiratory failure, unspecified whether with hypoxia or hypercapnia: Secondary | ICD-10-CM

## 2019-10-31 DIAGNOSIS — Z20822 Contact with and (suspected) exposure to covid-19: Secondary | ICD-10-CM | POA: Diagnosis present

## 2019-10-31 DIAGNOSIS — R0602 Shortness of breath: Secondary | ICD-10-CM | POA: Diagnosis not present

## 2019-10-31 DIAGNOSIS — E039 Hypothyroidism, unspecified: Secondary | ICD-10-CM | POA: Diagnosis present

## 2019-10-31 DIAGNOSIS — R061 Stridor: Secondary | ICD-10-CM | POA: Diagnosis present

## 2019-10-31 DIAGNOSIS — E7800 Pure hypercholesterolemia, unspecified: Secondary | ICD-10-CM | POA: Diagnosis not present

## 2019-10-31 DIAGNOSIS — J384 Edema of larynx: Secondary | ICD-10-CM | POA: Diagnosis present

## 2019-10-31 DIAGNOSIS — Z7901 Long term (current) use of anticoagulants: Secondary | ICD-10-CM | POA: Diagnosis not present

## 2019-10-31 DIAGNOSIS — Z79899 Other long term (current) drug therapy: Secondary | ICD-10-CM | POA: Diagnosis not present

## 2019-10-31 DIAGNOSIS — E038 Other specified hypothyroidism: Secondary | ICD-10-CM | POA: Diagnosis not present

## 2019-10-31 DIAGNOSIS — I69320 Aphasia following cerebral infarction: Secondary | ICD-10-CM

## 2019-10-31 DIAGNOSIS — I10 Essential (primary) hypertension: Secondary | ICD-10-CM | POA: Diagnosis present

## 2019-10-31 DIAGNOSIS — J386 Stenosis of larynx: Secondary | ICD-10-CM | POA: Diagnosis present

## 2019-10-31 DIAGNOSIS — Z950 Presence of cardiac pacemaker: Secondary | ICD-10-CM | POA: Diagnosis not present

## 2019-10-31 DIAGNOSIS — E785 Hyperlipidemia, unspecified: Secondary | ICD-10-CM | POA: Diagnosis present

## 2019-10-31 DIAGNOSIS — Z8673 Personal history of transient ischemic attack (TIA), and cerebral infarction without residual deficits: Secondary | ICD-10-CM | POA: Diagnosis not present

## 2019-10-31 LAB — CBC
HCT: 41.9 % (ref 36.0–46.0)
Hemoglobin: 13.8 g/dL (ref 12.0–15.0)
MCH: 29.1 pg (ref 26.0–34.0)
MCHC: 32.9 g/dL (ref 30.0–36.0)
MCV: 88.4 fL (ref 80.0–100.0)
Platelets: 320 10*3/uL (ref 150–400)
RBC: 4.74 MIL/uL (ref 3.87–5.11)
RDW: 14.7 % (ref 11.5–15.5)
WBC: 11.5 10*3/uL — ABNORMAL HIGH (ref 4.0–10.5)
nRBC: 0 % (ref 0.0–0.2)

## 2019-10-31 LAB — APTT
aPTT: 102 seconds — ABNORMAL HIGH (ref 24–36)
aPTT: 105 seconds — ABNORMAL HIGH (ref 24–36)

## 2019-10-31 LAB — HEPARIN LEVEL (UNFRACTIONATED): Heparin Unfractionated: 1.48 IU/mL — ABNORMAL HIGH (ref 0.30–0.70)

## 2019-10-31 MED ORDER — APIXABAN 5 MG PO TABS
5.0000 mg | ORAL_TABLET | Freq: Two times a day (BID) | ORAL | Status: DC
  Administered 2019-10-31 – 2019-11-01 (×2): 5 mg via ORAL
  Filled 2019-10-31 (×2): qty 1

## 2019-10-31 MED ORDER — PREDNISONE 20 MG PO TABS: 40.0000 mg | ORAL_TABLET | Freq: Every day | ORAL | Status: DC

## 2019-10-31 MED ORDER — PREDNISONE 20 MG PO TABS
40.0000 mg | ORAL_TABLET | Freq: Every day | ORAL | Status: DC
  Administered 2019-10-31 – 2019-11-01 (×2): 40 mg via ORAL
  Filled 2019-10-31 (×2): qty 2

## 2019-10-31 MED ORDER — SODIUM CHLORIDE 0.9 % IV SOLN
3.0000 g | Freq: Three times a day (TID) | INTRAVENOUS | Status: DC
  Administered 2019-10-31 – 2019-11-01 (×3): 3 g via INTRAVENOUS
  Filled 2019-10-31: qty 8
  Filled 2019-10-31: qty 3
  Filled 2019-10-31: qty 8
  Filled 2019-10-31 (×2): qty 3
  Filled 2019-10-31: qty 8

## 2019-10-31 NOTE — Consult Note (Signed)
Reason for Consult: Evaluate patient with respiratory stridor and subglottic fullness Referring Physician: Dr. Janace Hoard, ED  Jamie Mitchell is an 74 y.o. female.  HPI: Patient is a 74 year old female who has had history of a stroke and underwent thrombectomy a little over a year ago requiring intubation for several days.  Over the past couple of months she has had some dysphagia and sore throat.  I had seen her previously in my office about 2 months ago with normal upper airway examination and no respiratory problems.  Her complaints at that time was sore throat.  She does not understand or speak [place] well. She presented to the ED yesterday because of increasing stridor that has been going on for the past several weeks.  She was seen in the ED by Dr. Janace Hoard who did fiberoptic laryngoscopy and noted subglottic fullness.  She subsequently underwent a CT scan of her neck that showed swelling and fullness of the right side of the glottis.  This was compared to her previous CT scan of her neck performed in February that showed a normal appearing glottis. She is presently breathing comfortably with minimal hoarseness in the hospital.  I speak with her through her son who does the translation.  Past Medical History:  Diagnosis Date  . Atrial fibrillation ([place])   . Hypertension   . Stroke ([place])   . Thyroid disease     Past Surgical History:  Procedure Laterality Date  . IR CT HEAD LTD  07/20/2019  . IR PERCUTANEOUS ART THROMBECTOMY/INFUSION INTRACRANIAL INC DIAG ANGIO  07/20/2019  . RADIOLOGY WITH ANESTHESIA N/A 07/20/2019   Procedure: IR WITH ANESTHESIA;  Surgeon: Luanne Bras, MD;  Location: [facility];  Service: Radiology;  Laterality: N/A;    Social History:  reports that she has never smoked. She has never used smokeless tobacco. She reports that she does not drink alcohol or use drugs.  Allergies: No Known Allergies  Medications: I have reviewed the patient's current  medications.  Results for orders placed or performed during the hospital encounter of 10/30/19 (from the past 48 hour(s))  CBC with Differential     Status: None   Collection Time: 10/30/19  9:13 AM  Result Value Ref Range   WBC 9.1 4.0 - 10.5 K/uL   RBC 5.11 3.87 - 5.11 MIL/uL   Hemoglobin 14.8 12.0 - 15.0 g/dL   HCT 45.9 36.0 - 46.0 %   MCV 89.8 80.0 - 100.0 fL   MCH 29.0 26.0 - 34.0 pg   MCHC 32.2 30.0 - 36.0 g/dL   RDW 14.5 11.5 - 15.5 %   Platelets 327 150 - 400 K/uL   nRBC 0.0 0.0 - 0.2 %   Neutrophils Relative % 57 %   Neutro Abs 5.1 1.7 - 7.7 K/uL   Lymphocytes Relative 35 %   Lymphs Abs 3.2 0.7 - 4.0 K/uL   Monocytes Relative 8 %   Monocytes Absolute 0.7 0.1 - 1.0 K/uL   Eosinophils Relative 0 %   Eosinophils Absolute 0.0 0.0 - 0.5 K/uL   Basophils Relative 0 %   Basophils Absolute 0.0 0.0 - 0.1 K/uL   Immature Granulocytes 0 %   Abs Immature Granulocytes 0.02 0.00 - 0.07 K/uL    Comment: Performed at [facility], [address], [place], [place] [address]  Comprehensive metabolic panel     Status: Abnormal   Collection Time: 10/30/19  9:13 AM  Result Value Ref Range   Sodium 134 (L) 135 -  145 mmol/L   Potassium 3.9 3.5 - 5.1 mmol/L   Chloride 96 (L) 98 - 111 mmol/L   CO2 26 22 - 32 mmol/L   Glucose, Bld 140 (H) 70 - 99 mg/dL    Comment: Glucose reference range applies only to samples taken after fasting for at least 8 hours.   BUN 6 (L) 8 - 23 mg/dL   Creatinine, Ser 0.88 0.44 - 1.00 mg/dL   Calcium 8.7 (L) 8.9 - 10.3 mg/dL   Total Protein 7.8 6.5 - 8.1 g/dL   Albumin 3.7 3.5 - 5.0 g/dL   AST 26 15 - 41 U/L   ALT 23 0 - 44 U/L   Alkaline Phosphatase 101 38 - 126 U/L   Total Bilirubin 1.2 0.3 - 1.2 mg/dL   GFR calc non Af Amer >60 >60 mL/min   GFR calc Af Amer >60 >60 mL/min   Anion gap 12 5 - 15    Comment: Performed at [facility], [place] [address], [place], [place] [address]  Protime-INR     Status: None   Collection Time: 10/30/19  9:13 AM   Result Value Ref Range   Prothrombin Time 15.1 11.4 - 15.2 seconds   INR 1.2 0.8 - 1.2    Comment: (NOTE) INR goal varies based on device and disease states. Performed at [facility], [place] [address] [place], [place] [address]   SARS Coronavirus 2 by RT PCR (hospital order, performed in [facility] hospital lab) Nasopharyngeal Nasopharyngeal Swab     Status: None   Collection Time: 10/30/19  9:29 AM   Specimen: Nasopharyngeal Swab  Result Value Ref Range   SARS Coronavirus 2 NEGATIVE NEGATIVE    Comment: (NOTE) SARS-CoV-2 target nucleic acids are NOT DETECTED. The SARS-CoV-2 RNA is generally detectable in upper and lower respiratory specimens during the acute phase of infection. The lowest concentration of SARS-CoV-2 viral copies this assay can detect is 250 copies / mL. A negative result does not preclude SARS-CoV-2 infection and should not be used as the sole basis for treatment or other patient management decisions.  A negative result may occur with improper specimen collection / handling, submission of specimen other than nasopharyngeal swab, presence of viral mutation(s) within the areas targeted by this assay, and inadequate number of viral copies (<250 copies / mL). A negative result must be combined with clinical observations, patient history, and epidemiological information. Fact Sheet for Patients:   StrictlyIdeas.no Fact Sheet for Healthcare Providers: BankingDealers.co.za This test is not yet approved or cleared  by the [place] FDA and has been authorized for detection and/or diagnosis of SARS-CoV-2 by FDA under an Emergency Use Authorization (EUA).  This EUA will remain in effect (meaning this test can be used) for the duration of the COVID-19 declaration under Section 564(b)(1) of the Act, 21 U.S.C. section 360bbb-3(b)(1), unless the authorization is terminated or revoked sooner. Performed at [facility], [place] [address] [place], [place] [address]   Heparin level (unfractionated)     Status: Abnormal   Collection Time: 10/31/19  3:42 AM  Result Value Ref Range   Heparin Unfractionated 1.48 (H) 0.30 - 0.70 IU/mL    Comment: RESULTS CONFIRMED BY MANUAL DILUTION Performed at [facility], [place] [address] [place],  [address]   APTT     Status: Abnormal   Collection Time: 10/31/19  3:42 AM  Result Value Ref Range   aPTT 102 (H) 24 - 36  seconds    Comment:        IF BASELINE aPTT IS ELEVATED, SUGGEST PATIENT RISK ASSESSMENT BE USED TO DETERMINE APPROPRIATE ANTICOAGULANT THERAPY. Performed at [facility], [place] [address] [place], [place] [address]   CBC     Status: Abnormal   Collection Time: 10/31/19  3:42 AM  Result Value Ref Range   WBC 11.5 (H) 4.0 - 10.5 K/uL   RBC 4.74 3.87 - 5.11 MIL/uL   Hemoglobin 13.8 12.0 - 15.0 g/dL   HCT 41.9 36.0 - 46.0 %   MCV 88.4 80.0 - 100.0 fL   MCH 29.1 26.0 - 34.0 pg   MCHC 32.9 30.0 - 36.0 g/dL   RDW 14.7 11.5 - 15.5 %   Platelets 320 150 - 400 K/uL   nRBC 0.0 0.0 - 0.2 %    Comment: Performed at [facility], [place] [address], [place], [place] [address]  APTT     Status: Abnormal   Collection Time: 10/31/19 11:54 AM  Result Value Ref Range   aPTT 105 (H) 24 - 36 seconds    Comment:        IF BASELINE aPTT IS ELEVATED, SUGGEST PATIENT RISK ASSESSMENT BE USED TO DETERMINE APPROPRIATE ANTICOAGULANT THERAPY. Performed at [facility], [place] [address], [place],  [address]     CT Soft Tissue Neck W Contrast  Result Date: 10/30/2019 CLINICAL DATA:  Epiglottitis or tonsillitis suspected. Additional history provided: Difficulty breathing, difficulty swallowing and change in voice. EXAM: CT NECK WITH CONTRAST TECHNIQUE: Multidetector CT imaging of the neck was performed using the standard protocol following the bolus administration of intravenous contrast. CONTRAST:  62mL OMNIPAQUE IOHEXOL 300 MG/ML   SOLN COMPARISON:  No pertinent prior studies available for comparison. FINDINGS: The examination is mildly motion degraded, particularly at the level of the mid to lower neck and upper chest. Pharynx and larynx: No swelling or discrete mass is identified within the oral cavity or pharynx. Postinflammatory calcifications of the right palatine tonsil. There is nonspecific soft tissue prominence with possible edema at the level of the glottis without definite discrete mass identified. Apparent moderate airway narrowing at this level. Salivary glands: No inflammation, mass, or stone. Thyroid: No discrete thyroid mass is identified, although motion degradation limits evaluation. Lymph nodes: No discrete Vascular: The major vascular structures of the neck are patent. Limited intracranial: No acute abnormality identified. Visualized orbits: Incompletely imaged. Visualized orbits show no acute finding. Mastoids and visualized paranasal sinuses: Trace ethmoid sinus mucosal thickening. Tiny left maxillary sinus mucous retention cyst. Left middle ear/mastoid effusion. Skeleton: Nonspecific 8 mm lucent lesion within the left aspect of the T5 vertebral body. Mild cervical spondylosis. Upper chest: No consolidation within the imaged lung apices. IMPRESSION: Examination degraded by mild motion artifact, particularly at the level of the mid to lower neck and upper chest. There is nonspecific soft tissue prominence with possible edema at the level of the glottis without definite discrete mass identified. Direct visualization should be considered for further evaluation. Apparent moderate airway narrowing at this level. Left middle ear/mastoid effusion. Trace ethmoid sinus mucosal thickening. Tiny left maxillary sinus mucous retention cyst. Nonspecific 8 mm lucent lesion within the T5 vertebral body. Electronically Signed   By: Kellie Simmering DO   On: 10/30/2019 13:05   DG Chest Port 1 View  Result Date: 10/30/2019 CLINICAL DATA:   Short of breath. EXAM: PORTABLE CHEST 1 VIEW COMPARISON:  10/26/2019 FINDINGS: Cardiac silhouette is normal in  size. No mediastinal or hilar masses. Lung volumes are low. Mild lung base opacity noted consistent with atelectasis. Lungs are otherwise clear. No pleural effusion or pneumothorax. There is generalized increased bowel gas. Skeletal structures are demineralized but grossly intact. IMPRESSION: No acute cardiopulmonary disease. Electronically Signed   By: Lajean Manes M.D.   On: 10/30/2019 10:36    ROS: Breathing symptoms have becoming worse over the past few weeks.   PE: On exam at the bedside she is having no stridor at rest. Oral exam revealed clear oropharynx. Fiberoptic laryngoscopy was performed through the left nostril.  The nasopharynx was clear.  The base of tongue was clear.  Epiglottis was normal in appearance.  Vocal cords were clear bilaterally with normal vocal mobility.  However on evaluation of the subglottic area she has fullness in the left subglottis but no gross ulceration.  No masses noted.  Assessment/Plan: Left subglottis fullness questionable etiology. Respiratory difficulty.  Recommendation: Would recommend IV antibiotics as well as steroids over the next week.  If she is breathing comfortably could discharge her home on p.o. antibiotics and have her follow-up in my office in 2 weeks for recheck. Would recommend pulmonary consult. I reviewed the CT scan with radiology.  I would recommend repeat CT scan of the larynx or neck in 7 to 10 days.  Jamie Mitchell 10/31/2019, 1:02 PM

## 2019-10-31 NOTE — Evaluation (Signed)
Clinical/Bedside Swallow Evaluation Patient Details  Name: Jamie Mitchell MRN: [id number] Date of Birth: 10-04-1945  Today's Date: 10/31/2019 Time: SLP Start Time (ACUTE ONLY): 0840 SLP Stop Time (ACUTE ONLY): 0852 SLP Time Calculation (min) (ACUTE ONLY): 12 min  Past Medical History:  Past Medical History:  Diagnosis Date  . Atrial fibrillation ([place])   . Hypertension   . Stroke ([place])   . Thyroid disease    Past Surgical History:  Past Surgical History:  Procedure Laterality Date  . IR CT HEAD LTD  07/20/2019  . IR PERCUTANEOUS ART THROMBECTOMY/INFUSION INTRACRANIAL INC DIAG ANGIO  07/20/2019  . 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   The patient was admitted in February 2020 for a large left-sided MCA stroke.   During the hospitalization she underwent IR thrombectomy for which she was intubated for duration of 3 days.  After leaving the hospital her son reports she had dysphonia but no stridor.  She has been seeing OP SLP and reported some recent dysphagia. MBS during initial hospitalization showed primary oral dysphagia with decreased austomaticity, one instance of sensed aspiration.  Over the last month stridor has developed, which family describes as breathing.  Over the past few days she has had worsening of her breathing issues.  ENT exam reports: "Both vocal cords move normally and look normal.  There is obvious subglottic swelling with the right side being fairly significant.  It does not look granulation type nor does it look like a tumor.  She clearly does have narrowing that  would explain the stridor. " Ct confirms edema, no mass. CXR clear.    Assessment / Plan / Recommendation Clinical Impression  Pt presents with stridor in the setting of known subglottic edema (ENT w/u in progress). She reports a feeling of fullness/globus with discomfort eating at times. At baseline, prior to PO, she frequently coughs and expectorates mucous. Under observation there are no immediate signs of oral dysphagia or aspiration. Pt reports that the PO given did not "stop her up." She is not a very good historian due to baseline language deficits. After session MD reported to SLP that her son said she has been regurgitating her meals, but MD says her wiehgt is stable. There is a curious constellation of symptoms. Overall, oral and oropharyngeal function appear WNL, but if there is regurgitation and laryngeal inflammation there is suspicion of a primary esophageal/GER related complaint. Suggest dedicated esophageal testing with SLP f/u as needed. Also consider full liquid diet at first. SLP will f/u during admission for needs.  SLP Visit Diagnosis: Dysphagia, unspecified (R13.10)    Aspiration Risk  Risk for inadequate nutrition/hydration    Diet Recommendation Thin liquid   Medication Administration: Crushed with puree Supervision: Patient able to self feed Compensations: Slow rate;Small sips/bites Postural Changes: Seated upright at 90 degrees;Remain upright for at least 30 minutes after po intake    Other  Recommendations Recommended Consults: Consider esophageal assessment   Follow up Recommendations 24 hour supervision/assistance;Outpatient SLP      Frequency and Duration min 2x/week  2 weeks       Prognosis        Swallow Study  General HPI: 74 year old woman with history significant for cerebrovascular accident (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   The patient was admitted in February 2020 for a large left-sided MCA stroke.   During the hospitalization she underwent IR thrombectomy for which she was intubated for duration of 3 days.  After leaving the hospital her son reports she had dysphonia but no stridor.  She has been seeing OP SLP and reported some recent dysphagia. MBS during initial hospitalization showed primary oral dysphagia with decreased austomaticity, one instance of sensed aspiration.  Over the last month stridor has developed, which family describes as breathing.  Over the past few days she has had worsening of her breathing issues.  ENT exam reports: "Both vocal cords move normally and look normal.  There is obvious subglottic swelling with the right side being fairly significant.  It does not look granulation type nor does it look like a tumor.  She clearly does have narrowing that would explain the stridor. " Ct confirms edema, no mass. CXR clear.  Type of Study: Bedside Swallow Evaluation Previous Swallow Assessment: none Diet Prior to this Study: NPO Temperature Spikes Noted: No Respiratory Status: Room air History of Recent Intubation: No Behavior/Cognition: Alert;Cooperative;Pleasant mood Oral Cavity Assessment: Within Functional Limits Oral Care Completed by SLP: No Oral Cavity - Dentition: Adequate natural dentition Vision: Functional for self-feeding Self-Feeding Abilities: Able to feed self Patient Positioning: Upright in bed Baseline Vocal Quality: Normal;Other (comment)(stridor) Volitional Cough: Strong Volitional Swallow: Able to elicit    Oral/Motor/Sensory Function Overall Oral Motor/Sensory Function: Within functional limits   Ice Chips Ice chips: Not tested   Thin Liquid Thin Liquid: Within functional limits Presentation: Cup;Straw;Self Fed    Nectar Thick Nectar Thick Liquid: Not tested   Honey Thick Honey Thick Liquid: Not tested   Puree Puree:  Within functional limits Presentation: Self Fed   Solid     Solid: Within functional limits Presentation: [place] Simi Briel, MA [place] Pager [phone] Office [phone]  Lynann Beaver 10/31/2019,9:15 AM

## 2019-10-31 NOTE — Progress Notes (Signed)
ANTICOAGULATION CONSULT NOTE - Follow Up Consult  Pharmacy Consult for Heparin (Eliquis on hold) Indication: stroke history / Afib No Known Allergies  Patient Measurements: Weight: 68 kg (150 lb)  Vital Signs: Temp: 98.5 F (36.9 C) (06/08 0722) BP: 135/62 (06/08 0722) Pulse Rate: 68 (06/08 0722)  Labs: Recent Labs    10/30/19 0913 10/31/19 0342 10/31/19 1154  HGB 14.8 13.8  --   HCT 45.9 41.9  --   PLT 327 320  --   APTT  --  102* 105*  LABPROT 15.1  --   --   INR 1.2  --   --   HEPARINUNFRC  --  1.48*  --   CREATININE 0.88  --   --     Estimated Creatinine Clearance: 49.2 mL/min (by C-G formula based on SCr of 0.88 mg/dL).  Assessment: 74 year old female s/p CVA 3 months ago admitted with SOB and dysphagia.  Prior to admission Eliquis for Afib to be transitioned to heparin in case she needs a procedure, last dose of Eliquis reported at 8 pm 6/6.  Heparin level 1.48 (remains elevated due to Eliquis). PTT 105 sec on gtt at 1000 units/hr - this is just above goal. No bleeding noted, CBC is stable.   Goal of Therapy:  aPTT 66 to 102 seconds seconds Monitor platelets by anticoagulation protocol: Yes  Heparin level 0.3-0.7 units/ml   Plan:  Decrease heparin drip at 950 units/hr Daily aPTT, heparin level, CBC Monitor for s/sx of bleeding   Thank you for involving pharmacy in this patient's care.  Renold Genta, PharmD, BCPS Clinical Pharmacist Clinical phone for 10/31/2019 until 3p is [phone] 10/31/2019 2:26 PM  **Pharmacist phone directory can be found on [email or web address] listed under [place]**

## 2019-10-31 NOTE — Patient Outreach (Signed)
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  No patient outreach at this time due hospitalization.  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  Please follow up for discharge planning and disposition.  I will close patient's case if hospitalization is greater than 10 days and if less than 10 days, I will follow up with patient's son, to complete post hospitalization follow up.    Thanks, Amalia Greenhouse H. Annia Friendly, BSN, [place] Management [facility] Telephonic CM Phone: [phone] Fax: [phone]

## 2019-10-31 NOTE — Progress Notes (Signed)
ANTICOAGULATION CONSULT NOTE - Follow Up Consult  Pharmacy Consult for Heparin (Eliquis on hold) Indication: stroke history / Afib No Known Allergies  Patient Measurements: Weight: 68 kg (150 lb)  Vital Signs: Temp: 98.5 F (36.9 C) (06/07 2014) Temp Source: Oral (06/07 2014) BP: 139/86 (06/07 2014) Pulse Rate: 78 (06/07 2014)  Labs: Recent Labs    10/30/19 0913 10/31/19 0342  HGB 14.8 13.8  HCT 45.9 41.9  PLT 327 320  APTT  --  102*  LABPROT 15.1  --   INR 1.2  --   HEPARINUNFRC  --  1.48*  CREATININE 0.88  --     Estimated Creatinine Clearance: 49.2 mL/min (by C-G formula based on SCr of 0.88 mg/dL).  Assessment: 74 year old female s/p CVA 3 months ago admitted with SOB and dysphagia.  Prior to admission Eliquis for Afib to be transitioned to heparin in case she needs a procedure, last dose of Eliquis reported at 8 pm 6/6.  Heparin level 1.48 (remains elevated due to Eliquis). PTT 102 sec (therapeutic) on gtt at 1000 units/hr. No bleeding noted.   Goal of Therapy:  aPTT 66 to 102 seconds seconds Monitor platelets by anticoagulation protocol: Yes  Heparin level 0.3-0.7 units/ml   Plan:  Continue heparin drip at 1000 units / hr Will f/u 6h PTT to confirm remains therapeutic  Sherlon Handing, PharmD, BCPS Please see amion for complete clinical pharmacist phone list 10/31/2019,5:54 AM

## 2019-10-31 NOTE — Consult Note (Signed)
   [facility] Inpatient Consult   10/31/2019  Jamie Mitchell 1945-06-14 [id number]  [facility] ACO Patient: Medicare NextGen  Patient is currently active with [place] [facility] for chronic disease management services.  Patient has been engaged by a [facility].  Our community based plan of care has focused on disease management and community resource support.  Chart review reveals patient is currently in observation status noted.  Plan: To follow for disposition and post hospital needs, if changes occur for follow up needs.   Of note, [facility] Care Management services does not replace or interfere with any services that are needed or arranged by inpatient [facility] care management team.  For additional questions or referrals please contact:  Natividad Brood, RN BSN [facility] Liaison  [phone] business mobile phone Toll free office [phone]  Fax number: [phone] [email or web address]

## 2019-10-31 NOTE — Progress Notes (Addendum)
Family Medicine Teaching Service Daily Progress Note Intern Pager: [phone]  Patient name: Jamie Mitchell Medical record number: [id number] Date of birth: [birth year] Age: 74 y.o. Gender: female  Primary Care Provider: Charlott Rakes, MD Consultants: ENT Code Status: Full code  Pt Overview and Major Events to Date:  6/7-patient admitted for stridor  Assessment and Plan: [name] is a 74 y.o. female presenting with shortness of breath. 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  Dr. Lucia Gaskins saw patient and recommended IV antibiotics as well as steroids over a week.  If breathing improves she may be switched to p.o. antibiotics and will follow up with Dr. Lucia Gaskins in 2 weeks.  He also recommended repeat CT scan of larynx or neck -ENT consulted, appreciate recommendations -Dr. Lucia Gaskins has seen patient, appreciate recommendations -Started Unasyn, prednisone 40 mg daily -Monitor for respiratory status changes -If worsening symptoms overnight consider racemic epinephrine or second dose of IV steroids -Consult speech for swallow study -mIVF -Vitals every 4 hours overnight -Protonix 40 mg daily -PT/OT eval and treat  Left MCA stroke s/p TPA with thrombectomy Patient with previous stroke approximately 3 months ago and since that time has had issues with dysphagia.  No focal neurologic deficits noted.  Home medications include Eliquis 5 mg twice daily and aspirin 81 mg daily -Holding home meds in case patient requires procedure -Heparin drip  Atrial fibrillation Home medication includes Eliquis 5 mg twice daily, aspirin 81 mg daily, amiodarone 200 mg daily -Holding home Eliquis and aspirin at this time -Per chart review patient may have scheduled appointment for pacemaker  placement on 6/10 -Continue amiodarone 200 mg daily -Continuous cardiac monitoring -Heparin drip per pharmacy  Hypothyroidism Home medications include Synthroid 88 mcg daily -Continue home Synthroid dose  Hypertension  Patient has no home medications listed.  Blood pressure since arrival have ranged from 127/69-180/117.  Most recent blood pressure was 126/85 -Monitor blood pressures -Consider antihypertensive  FEN/GI: NPO PPx: Heparin drip  Disposition: Pending follow-up evaluation by ENT  Subjective:  Reports breathing is improved.  Swallow study passed.  Objective: Temp:  [97.4 F (36.3 C)-98.5 F (36.9 C)] 98.5 F (36.9 C) (06/07 2014) Pulse Rate:  [76-85] 78 (06/07 2014) Resp:  [16-32] 25 (06/07 1720) BP: (107-180)/(68-118) 139/86 (06/07 2014) SpO2:  [92 %-100 %] 97 % (06/07 2014) Weight:  [68 kg] 68 kg (06/07 1900) Physical Exam: General: Well-appearing, no acute distress Cardiovascular: Rate and rhythm, no murmurs appreciated Respiratory: Lungs are clear to auscultation but there is noticeable stridor but improved from admission exam Abdomen: Soft, nontender, positive bowel sounds Extremities: No edema noted  Laboratory: Recent Labs  Lab 10/26/19 1806 10/30/19 0913 10/31/19 0342  WBC 7.0 9.1 11.5*  HGB 14.8 14.8 13.8  HCT 45.6 45.9 41.9  PLT 317 327 320   Recent Labs  Lab 10/26/19 1806 10/30/19 0913  NA 137 134*  K 4.6 3.9  CL 100 96*  CO2 25 26  BUN 11 6*  CREATININE 1.01* 0.88  CALCIUM 8.8* 8.7*  PROT  --  7.8  BILITOT  --  1.2  ALKPHOS  --  101  ALT  --  23  AST  --  26  GLUCOSE 114* 140*    Imaging/Diagnostic Tests: No results found.   Gifford Shave, MD 10/31/2019, 5:17 AM PGY-1, [place]  [place] Intern pager: [phone], text pages welcome

## 2019-10-31 NOTE — ED Provider Notes (Signed)
[facility]   [id number] 10/30/19 Arrival Time: 9735   Cc: RESPIRATORY DISTRESS  SUBJECTIVE:  Jamie Mitchell is a 74 y.o. female who presents with diffuculty breathing, talking, and swallowing for the last day, worse this morning.  Denies precipitating event, exposure, known allergy or trigger. Denies changes in medication or starting a new medication. Worsening SOB when trying to speak. Family states that her voice is very hoarse. Denies previous symptoms in the past. Denies fever, chills, nausea, vomiting, erythema, redness, swollen glands, oral manifestations such as throat swelling/ tingling, mouth swelling/ tingling, tongue swelling/tingling,  abdominal pain, changes in bowel or bladder function.     ROS: As per HPI.  All other pertinent ROS negative.     Past Medical History:  Diagnosis Date  . Atrial fibrillation ([place])   . Hypertension   . Stroke ([place])   . Thyroid disease    Past Surgical History:  Procedure Laterality Date  . IR CT HEAD LTD  07/20/2019  . IR PERCUTANEOUS ART THROMBECTOMY/INFUSION INTRACRANIAL INC DIAG ANGIO  07/20/2019  . RADIOLOGY WITH ANESTHESIA N/A 07/20/2019   Procedure: IR WITH ANESTHESIA;  Surgeon: Luanne Bras, MD;  Location: [facility];  Service: Radiology;  Laterality: N/A;   No Known Allergies No current facility-administered medications on file prior to encounter.   Current Outpatient Medications on File Prior to Encounter  Medication Sig Dispense Refill  . albuterol (VENTOLIN HFA) 108 (90 Base) MCG/ACT inhaler Inhale 1-2 puffs into the lungs every 6 (six) hours as needed for wheezing or shortness of breath. 18 g 0  . amiodarone (PACERONE) 200 MG tablet Take 1 tablet (200 mg total) by mouth daily. 30 tablet 5  . apixaban (ELIQUIS) 5 MG TABS tablet Take 1 tablet (5 mg total) by mouth 2 (two) times daily. 90 tablet 3  . aspirin EC 81 MG tablet Take 81 mg by mouth daily.    [name] atorvastatin (LIPITOR) 40 MG tablet Take 1 tablet (40 mg  total) by mouth daily. (Patient taking differently: 40 mg every evening. ) 30 tablet 0  . cetirizine (ZYRTEC ALLERGY) 10 MG tablet Take 1 tablet (10 mg total) by mouth daily. 30 tablet 0  . levothyroxine (SYNTHROID) 88 MCG tablet Take 1 tablet (88 mcg total) by mouth daily before breakfast. 30 tablet 3  . omeprazole (PRILOSEC) 20 MG capsule Take 1 capsule (20 mg total) by mouth daily. 14 capsule 0  . [DISCONTINUED] famotidine (PEPCID) 20 MG tablet Take 1 tablet (20 mg total) by mouth 2 (two) times daily. (Patient not taking: Reported on 10/24/2019) 60 tablet 0    Social History   Socioeconomic History  . Marital status: Married    Spouse name: Not on file  . Number of children: Not on file  . Years of education: Not on file  . Highest education level: Not on file  Occupational History  . Not on file  Tobacco Use  . Smoking status: Never Smoker  . Smokeless tobacco: Never Used  Substance and Sexual Activity  . Alcohol use: No  . Drug use: No  . Sexual activity: Not Currently    Birth control/protection: None    Comment: Married  Other Topics Concern  . Not on file  Social History Narrative   Lives with son [name])    Social Determinants of Health   Financial Resource Strain:   . Difficulty of Paying Living Expenses:   Food Insecurity:   . Worried About Charity fundraiser in the Last  Year:   . Ran Out of Food in the Last Year:   Transportation Needs: No Transportation Needs  . Lack of Transportation (Medical): No  . Lack of Transportation (Non-Medical): No  Physical Activity:   . Days of Exercise per Week:   . Minutes of Exercise per Session:   Stress:   . Feeling of Stress :   Social Connections:   . Frequency of Communication with Friends and Family:   . Frequency of Social Gatherings with Friends and Family:   . Attends Religious Services:   . Active Member of Clubs or Organizations:   . Attends Archivist Meetings:   [name] Marital Status:   Intimate Partner  Violence:   . Fear of Current or Ex-Partner:   . Emotionally Abused:   [name] Physically Abused:   . Sexually Abused:    Family History  Family history unknown: Yes     OBJECTIVE:  Vitals:   10/30/19 0850 10/30/19 0852  BP: (!) 180/117 (!) 151/91  Pulse: 76 78  Resp: (!) 32 (!) 30  Temp: 98 F (36.7 C)   SpO2: 96% 99%     General appearance: Acute respiratory distress, unable to speak in sentences, only single syllable words and nodding, using accessory muscles to breathe HEENT:NCAT; no obvious facial swelling;  Lungs: bilateral wheezing and stridor noted, tachypnea, labored breaths Heart: tachycardic   Abdomen: soft, nondistended, normal active bowel sounds; nontender to palpation; no guarding  Skin: warm and dry Psychological: alert and cooperative; normal mood and affect  ASSESSMENT & PLAN:  1. Acute respiratory distress     Meds ordered this encounter  Medications  . DISCONTD: albuterol (PROVENTIL) (2.5 MG/3ML) 0.083% nebulizer solution 5 mg  . methylPREDNISolone sodium succinate (SOLU-MEDROL) 125 mg/2 mL injection 125 mg    Acute Respiratory Distress   15 lpm O2 via nonrebreather Solumedrol 125mg  IV in office today Albuterol neb in office Some relief with medications EMS activated and transported to [facility] ER Concern for possible need for intubation           Faustino Congress, NP 10/31/19 1703

## 2019-11-01 ENCOUNTER — Inpatient Hospital Stay (HOSPITAL_COMMUNITY): Payer: Medicare Other

## 2019-11-01 ENCOUNTER — Telehealth: Payer: Self-pay

## 2019-11-01 ENCOUNTER — Other Ambulatory Visit: Payer: Self-pay

## 2019-11-01 ENCOUNTER — Other Ambulatory Visit (INDEPENDENT_AMBULATORY_CARE_PROVIDER_SITE_OTHER): Payer: Self-pay

## 2019-11-01 DIAGNOSIS — J392 Other diseases of pharynx: Secondary | ICD-10-CM

## 2019-11-01 LAB — BASIC METABOLIC PANEL
Anion gap: 9 (ref 5–15)
BUN: 14 mg/dL (ref 8–23)
CO2: 27 mmol/L (ref 22–32)
Calcium: 8.6 mg/dL — ABNORMAL LOW (ref 8.9–10.3)
Chloride: 103 mmol/L (ref 98–111)
Creatinine, Ser: 1.02 mg/dL — ABNORMAL HIGH (ref 0.44–1.00)
GFR calc Af Amer: >60 mL/min (ref >60)
GFR calc non Af Amer: 55 mL/min — ABNORMAL LOW (ref >60)
Glucose, Bld: 134 mg/dL — ABNORMAL HIGH (ref 70–99)
Potassium: 4.3 mmol/L (ref 3.5–5.1)
Sodium: 139 mmol/L (ref 135–145)

## 2019-11-01 LAB — CBC
HCT: 41.8 % (ref 36.0–46.0)
Hemoglobin: 13.8 g/dL (ref 12.0–15.0)
MCH: 29.4 pg (ref 26.0–34.0)
MCHC: 33 g/dL (ref 30.0–36.0)
MCV: 89.1 fL (ref 80.0–100.0)
Platelets: 302 10*3/uL (ref 150–400)
RBC: 4.69 MIL/uL (ref 3.87–5.11)
RDW: 14.8 % (ref 11.5–15.5)
WBC: 11 10*3/uL — ABNORMAL HIGH (ref 4.0–10.5)
nRBC: 0 % (ref 0.0–0.2)

## 2019-11-01 IMAGING — RF DG ESOPHAGUS
8 series · 24 of 24 positions shown · non-contrast
Comparison: CT neck [DATE]

CLINICAL DATA: Stroke 4 months ago.  Globus sensation.  Cough.

EXAM:
ESOPHOGRAM/BARIUM SWALLOW
TECHNIQUE: Single contrast examination was performed using  thin barium.
FLUOROSCOPY TIME:  Fluoroscopy Time:  2 minutes, 0 seconds
Radiation Exposure Index (if provided by the fluoroscopic device):
19.9 mGy
Number of Acquired Spot Images: 0

[Series 1: cp_standard · 0.34mm/px · 3 of 103 frames shown (1 of 8)]
[frame 16/103]
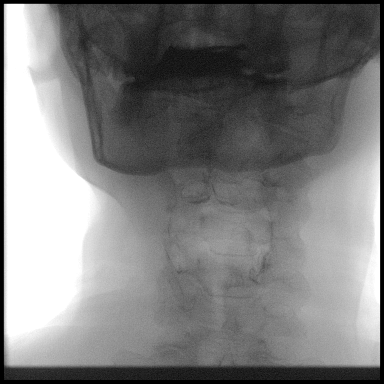
[frame 52/103]
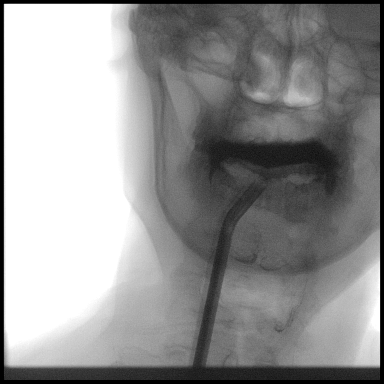
[frame 88/103]
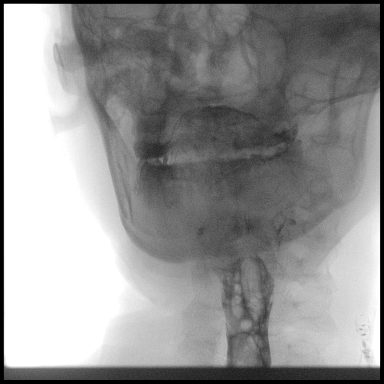

[Series 2: cp_standard · 0.34mm/px · 3 of 52 frames shown (2 of 8)]
[frame 8/52]
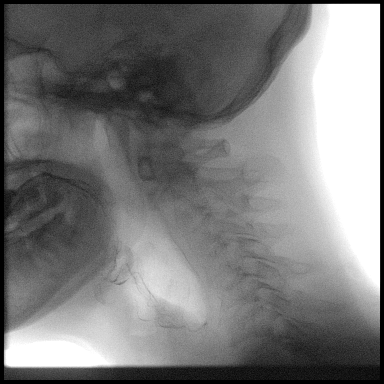
[frame 22/52]
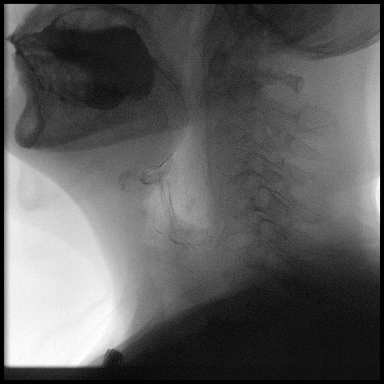
[frame 45/52]
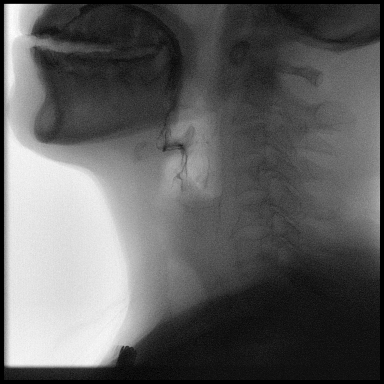

[Series 3: cp_standard · 0.34mm/px · 3 of 156 frames shown (3 of 8)]
[frame 24/156]
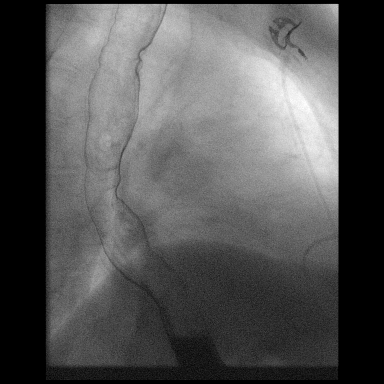
[frame 79/156]
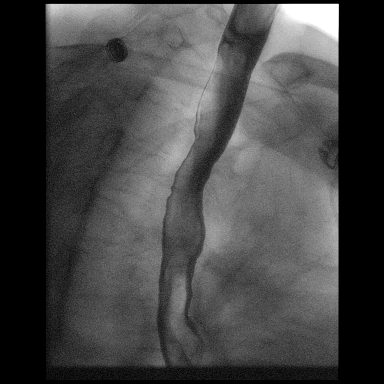
[frame 153/156  full-range]
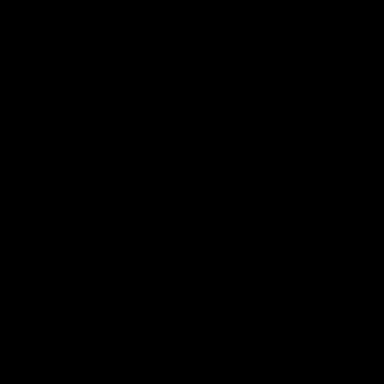

[Series 4: cp_standard · 0.34mm/px · 3 of 50 frames shown (4 of 8)]
[frame 8/50]
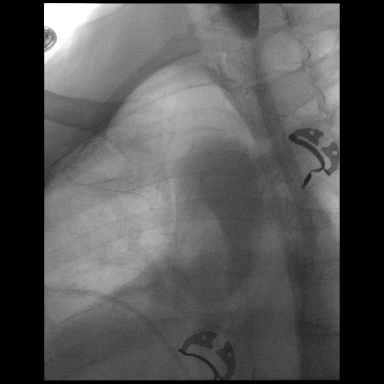
[frame 26/50]
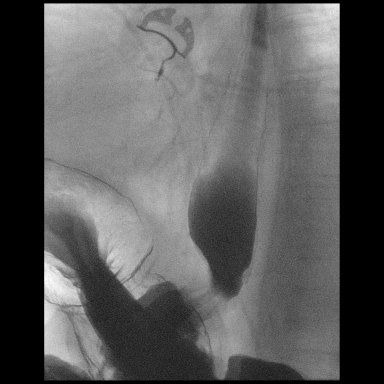
[frame 49/50]
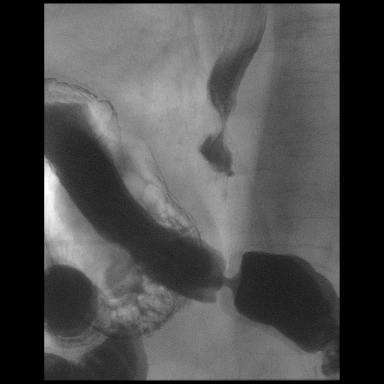

[Series 5: cp_standard · 0.34mm/px · 3 of 47 frames shown (5 of 8)]
[frame 8/47]
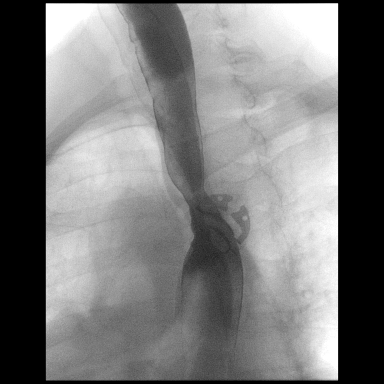
[frame 40/47]
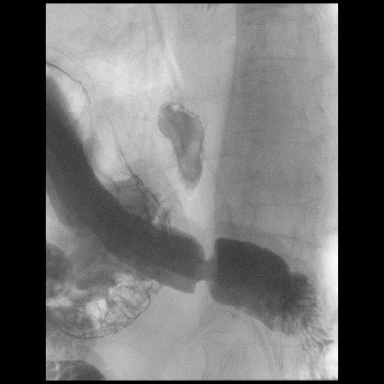
[frame 47/47]
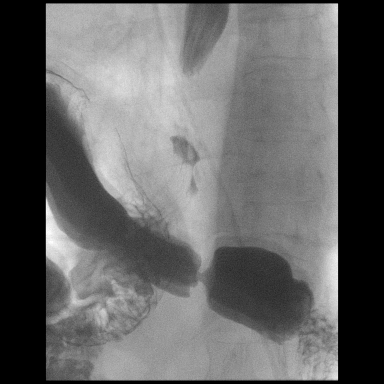

[Series 6: cp_standard · 0.34mm/px · 3 of 19 frames shown (6 of 8)]
[frame 3/19]
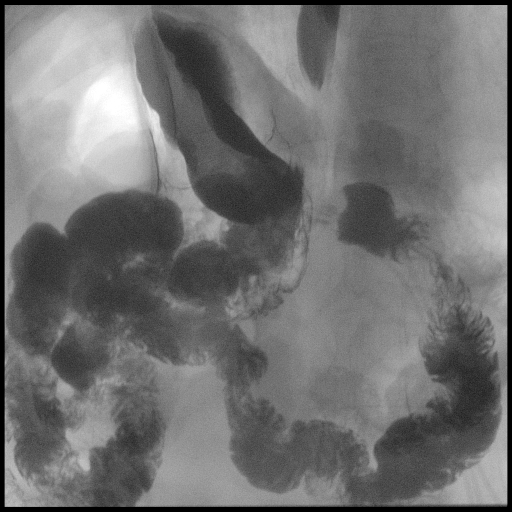
[frame 10/19]
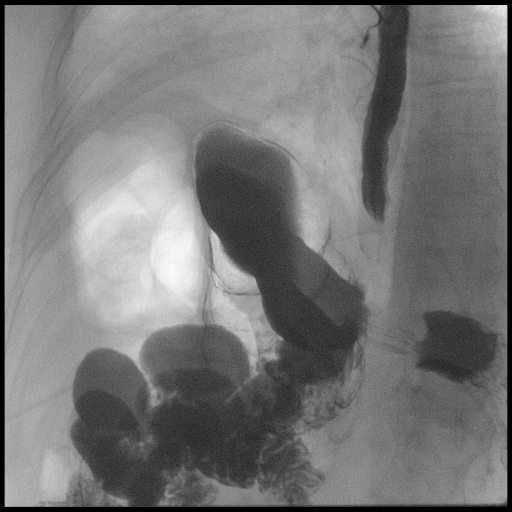
[frame 17/19]
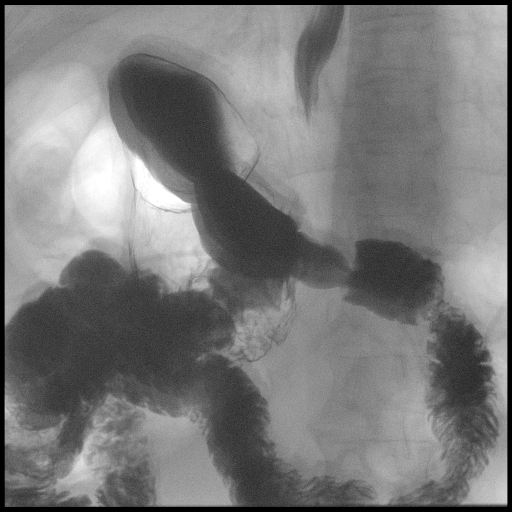

[Series 7: cp_standard · 0.51mm/px · 3 of 19 frames shown (7 of 8)]
[frame 3/19]
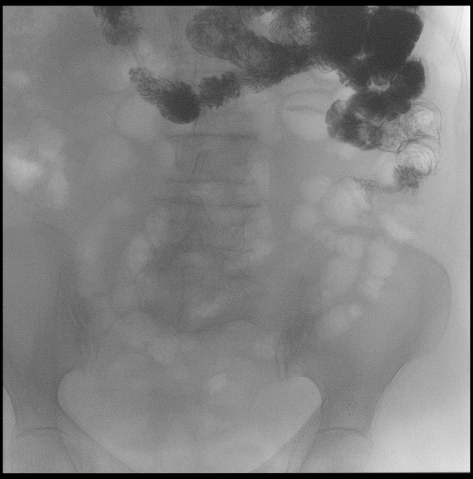
[frame 10/19]
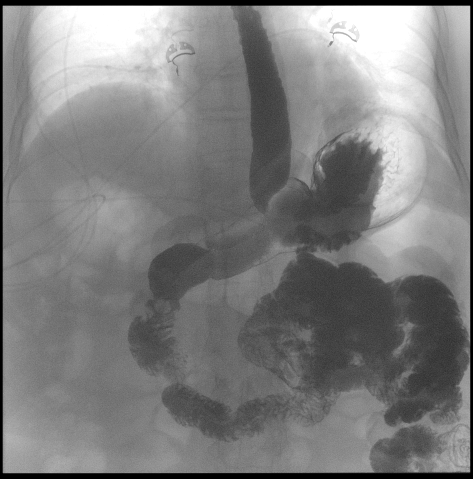
[frame 17/19]
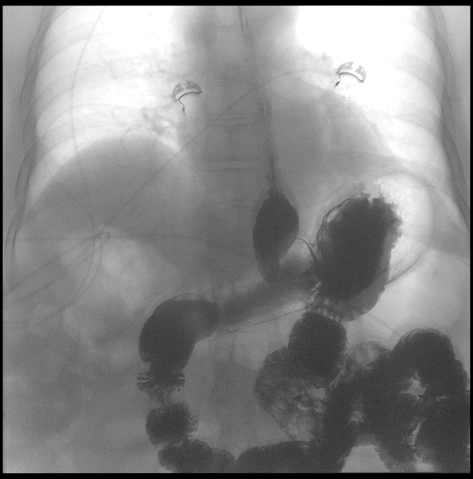

[Series 8: cp_standard · 0.34mm/px · 3 of 20 frames shown (8 of 8)]
[frame 4/20]
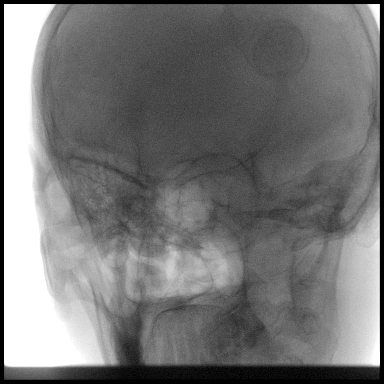
[frame 18/20]
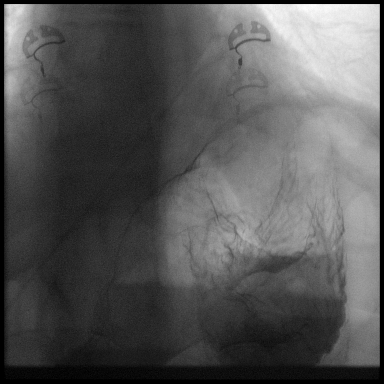
[frame 19/20]
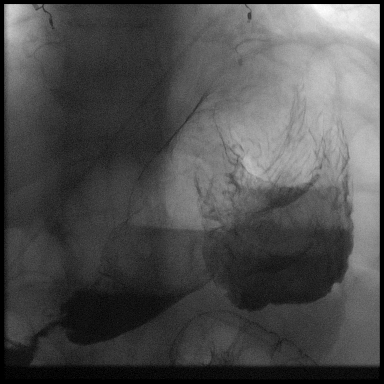

[24 of 24 positions shown; findings below may reference images not displayed]

FINDINGS: Language barrier, the patient's son was in the room and help with
translation although occasionally instructions were lost in
translation. I modified the protocol to try to best account for
this.

The pharyngeal phase of swallowing appears unremarkable for age.
Today's exam was performed as a single contrast esophagram although
we did achieve a double-contrast defect in series number 3
presumably due to swallowed air. There some mild secondary tertiary
contractions in the esophagus on this series, obtained with the
patient standing in the LPO position.

Small type 1 hiatal hernia. No esophageal stricture or narrowing. A
13 mm barium tablet passed without difficulty into the stomach.

Episodes of gastroesophageal reflux were noted during the exam.
There is a considerable amount of proximal skate of contrast medium
during swallows, a borderline appearance for nonspecific esophageal
dysmotility.

Mild organo-axial rotation of the stomach within the upper abdomen.
IMPRESSION: 1. Gastroesophageal reflux.
2. Borderline appearance for nonspecific esophageal dysmotility,
given the presence of proximal escape and initial secondary and
tertiary contractions.
3. Small type 1 hiatal hernia.
4. Mild organo-axial rotation of the stomach in the upper abdomen.

## 2019-11-01 MED ORDER — CHLORHEXIDINE GLUCONATE 4 % EX LIQD
4.0000 "application " | Freq: Once | CUTANEOUS | Status: DC
  Filled 2019-11-01: qty 60

## 2019-11-01 MED ORDER — CEFAZOLIN SODIUM-DEXTROSE 2-4 GM/100ML-% IV SOLN: 2.0000 g | INTRAVENOUS | Status: DC

## 2019-11-01 MED ORDER — SODIUM CHLORIDE 0.9 % IV SOLN: INTRAVENOUS | Status: DC

## 2019-11-01 MED ORDER — AMOXICILLIN-POT CLAVULANATE 875-125 MG PO TABS: 1.0000 | ORAL_TABLET | Freq: Two times a day (BID) | ORAL | 0 refills | Status: AC

## 2019-11-01 MED ORDER — PREDNISONE 10 MG PO TABS: ORAL_TABLET | ORAL | 0 refills | Status: AC

## 2019-11-01 MED ORDER — SODIUM CHLORIDE 0.9 % IV SOLN
80.0000 mg | INTRAVENOUS | Status: DC
  Filled 2019-11-01: qty 2

## 2019-11-01 NOTE — Plan of Care (Signed)
  Problem: Education: Goal: Knowledge of General Education information will improve Description: Including pain rating scale, medication(s)/side effects and non-pharmacologic comfort measures Outcome: Progressing   Problem: Health Behavior/Discharge Planning: Goal: Ability to manage health-related needs will improve Outcome: Progressing   Problem: Clinical Measurements: Goal: Ability to maintain clinical measurements within normal limits will improve Outcome: Progressing Goal: Will remain free from infection Outcome: Progressing Goal: Diagnostic test results will improve Outcome: Progressing Goal: Respiratory complications will improve Outcome: Progressing Goal: Cardiovascular complication will be avoided Outcome: Progressing   Problem: Activity: Goal: Risk for activity intolerance will decrease Outcome: Progressing   Problem: Nutrition: Goal: Adequate nutrition will be maintained Outcome: Progressing   Problem: Elimination: Goal: Will not experience complications related to bowel motility Outcome: Progressing Goal: Will not experience complications related to urinary retention Outcome: Progressing   Problem: Skin Integrity: Goal: Risk for impaired skin integrity will decrease Outcome: Progressing

## 2019-11-01 NOTE — Patient Outreach (Addendum)
First telephone outreach attempt to obtain mRS. No answer. Left message for returned call.  Per [facility], patient currently admitted as of 10/30/2019. CMA will attempt to make contact within 1 week.  [facility] Management Assistant [phone]

## 2019-11-01 NOTE — Discharge Instructions (Signed)
Thank you for allowing [place] to participate in your care!    You were admitted for your shortness of breath.  You were found to have swelling in your windpipe and were evaluated by the ear nose and throat doctor.  You were given steroids and antibiotics per the recommendations of the ENT doctor.  We did a swallow study which showed you have some issues carrying food and liquids from your mouth to your stomach.  You may need to follow-up with a gastroenterologist for recommendations on this issue.  You will also be following up with the ENT on 6/21.    We are discharging you on a steroid medication that you will decrease the dose of every few days.  Instructions will be included with the prescription information..  We are also discharging you on 6 more days of antibiotics.  Please complete these medications prescribed.  You will also be called with a date that a repeat CT scan was scheduled for you.  We also placed an order for a pulmonology consult but you may be required to see your primary care provider for that referral.  If you have any questions or concerns please reach out to Dr. Pollie Friar office.  If you experience worsening of your admission symptoms, develop shortness of breath, life threatening emergency, suicidal or homicidal thoughts you must seek medical attention immediately by calling 911 or calling your MD immediately  if symptoms less severe.  Gracias por permitirnos participar en su cuidado!  Fue admitido por su dificultad para respirar. Se descubri que tena hinchazn en la trquea y el mdico especialista en odo, nariz y garganta lo evalu. Se le administraron esteroides y antibiticos segn las recomendaciones del otorrinolaringlogo. Hicimos un estudio de la deglucin que mostr que tiene algunos problemas para transportar alimentos y lquidos de la boca al [place]. Es posible que deba hacer un seguimiento con un gastroenterlogo para Programmer, systems. Tambin  har un seguimiento con el ENT el 21 de junio.   Le damos el alta con un medicamento esteroide cuya dosis disminuir [place]. Las instrucciones se incluirn con la informacin de la prescripcin. Tambin le daremos el alta en 6 das ms de antibiticos. Complete estos medicamentos recetados. Tambin se le llamar con una fecha en la que se program una nueva tomografa computarizada para usted. Tambin hicimos un pedido para una consulta de neumologa, pero es posible que deba consultar a su proveedor de atencin primaria para esa derivacin. Si tiene alguna pregunta o inquietud, comunquese con la oficina del Dr. Lucia Gaskins.  Si experimenta un empeoramiento de sus sntomas de admisin, desarrolla dificultad para respirar, emergencia potencialmente mortal, pensamientos suicidas u homicidas, debe buscar atencin mdica de inmediato llamando al 911 o llamando a su mdico de inmediato si los sntomas son menos graves.

## 2019-11-01 NOTE — Discharge Summary (Signed)
[facility] Discharge Summary  Patient name: Jamie Mitchell Medical record number: [id number] Date of birth: Jun 05, 1945 Age: 74 y.o. 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 Atrial fibrillation Hypothyroidism Hypertension  Disposition: Home  Discharge Condition: Stable, improved  Discharge Exam:  Temp:  [98 F (36.7 C)-98.3 F (36.8 C)] 98 F (36.7 C) (06/09 0856) Pulse Rate:  [59-62] 59 (06/09 0856) Resp:  [17-20] 20 (06/09 0856) BP: (120-142)/(68-103) 120/68 (06/09 0856) SpO2:  [93 %-95 %] 95 % (06/09 0856) Physical Exam:  General: Resting in bed comfortably, no acute distress Cardiovascular: Regular rate and rhythm, no murmurs appreciated Respiratory: Normal work of breathing, lungs clear to auscultation, stridorous continue to improve Abdomen: Soft, nontender, positive bowel sounds Extremities: No edema noted  Brief Hospital Course:  Jamie Mitchell is a 74 y.o. female presenting with shortness of breath. 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  Below is her hospital course by problem list.  Subglottic edema with stridor Patient presented to the emergency department with shortness of breath and stridor.  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  She was given racemic epi in the emergency department.  She was kept n.p.o. overnight and her stridor improved.  ENT continue to follow after admission and reassessed her epiglottic swelling.  They recommended starting her on a steroid taper as well as antibiotics  for 1 week.  She was also evaluated by speech who recommended an esophageal swallow study.  Esophagram swallow study showed gastroesophageal reflux with borderline appearance for nonspecific esophageal dysmotility, given the presence of proximal escape and initial secondary and tertiary contractions.  There was a small type I hiatal hernia and mild organoaxial rotation of the stomach in the upper abdomen.  Patient's respiratory status improved and stridor decreased.  Patient was discharged on a prednisone taper and Augmentin for 6 additional days.  She was scheduled with follow-up with her ENT.  They will be scheduling a repeat CT neck to evaluate for resolution of subglottic edema.  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  Her Eliquis and aspirin were held on admission out of concern that she may need a procedure.  Her Eliquis was restarted after it was determined she would not be going to the OR but it was determined that there was no need for her to continue her aspirin.    Issues for Follow Up:  1.  Nonspecific 8 mm lucency in T5 vertebral body 2. Repeat CT Head/Neck 6/15 (10 days from prior exam) to assess for effectiveness of treatment, Dr. Lucia Gaskins will schedule this 3.  Follow-up appointment with Dr. Lucia Gaskins 4.  Follow-up with pulmonologist, may need referral from PCP or Dr. Lucia Gaskins 5.  Follow-up with gastroenterology regarding esophageal dysmotility and hiatal hernia  Significant Procedures:  6/9-esophageal swallow study completed  Significant Labs and Imaging:  Recent Labs  Lab 10/30/19 0913 10/31/19 0342 11/01/19 0728  WBC 9.1 11.5* 11.0*  HGB 14.8 13.8 13.8  HCT 45.9 41.9 41.8  PLT 327 320 302   Recent Labs  Lab 10/26/19 1806 10/26/19 1806 10/30/19 0913 11/01/19 0728  NA 137  --  134* 139  K 4.6   < > 3.9 4.3  CL 100  --  96* 103  CO2 25  --  26 27  GLUCOSE 114*  --  140* 134*  BUN 11  --  6* 14  CREATININE 1.01*  --  0.88 1.02*   CALCIUM 8.8*  --  8.7* 8.6*  ALKPHOS  --   --  101  --   AST  --   --  26  --   ALT  --   --  23  --   ALBUMIN  --   --  3.7  --    < > = values in this interval not displayed.    DG Chest 2 View  Result Date: 10/26/2019 CLINICAL DATA:  Shortness of breath, wheezing EXAM: CHEST - 2 VIEW COMPARISON:  10/09/2019 FINDINGS: The heart size and mediastinal contours are within normal limits. Low lung volumes with streaky bibasilar opacities, favor atelectasis. No pleural effusion or pneumothorax. Gaseous distention of the colon within the visualized abdomen. The visualized skeletal structures are unremarkable. IMPRESSION: 1. Low lung volumes with streaky bibasilar opacities, favor atelectasis. 2. Gaseous distention of the colon within the visualized abdomen. Electronically Signed   By: Davina Poke D.O.   On: 10/26/2019 18:55   DG Chest 2 View  Result Date: 10/09/2019 CLINICAL DATA:  Cough, chest pain EXAM: CHEST - 2 VIEW COMPARISON:  07/21/2019 FINDINGS: Normal heart size and pulmonary vascularity. Tortuous aorta. Decreased lung volumes with bibasilar atelectasis. Upper lungs clear. Central peribronchial thickening present. No pleural effusion or pneumothorax. Bones demineralized. IMPRESSION: Bronchitic changes with bibasilar atelectasis. Electronically Signed   By: Lavonia Dana M.D.   On: 10/09/2019 15:03   CT Soft Tissue Neck W Contrast  Result Date: 10/30/2019 CLINICAL DATA:  Epiglottitis or tonsillitis suspected. Additional history provided: Difficulty breathing, difficulty swallowing and change in voice. EXAM: CT NECK WITH CONTRAST TECHNIQUE: Multidetector CT imaging of the neck was performed using the standard protocol following the bolus administration of intravenous contrast. CONTRAST:  14mL OMNIPAQUE IOHEXOL 300 MG/ML  SOLN COMPARISON:  No pertinent prior studies available for comparison. FINDINGS: The examination is mildly motion degraded, particularly at the level of the mid to lower neck  and upper chest. Pharynx and larynx: No swelling or discrete mass is identified within the oral cavity or pharynx. Postinflammatory calcifications of the right palatine tonsil. There is nonspecific soft tissue prominence with possible edema at the level of the glottis without definite discrete mass identified. Apparent moderate airway narrowing at this level. Salivary glands: No inflammation, mass, or stone. Thyroid: No discrete thyroid mass is identified, although motion degradation limits evaluation. Lymph nodes: No discrete Vascular: The major vascular structures of the neck are patent. Limited intracranial: No acute abnormality identified. Visualized orbits: Incompletely imaged. Visualized orbits show no acute finding. Mastoids and visualized paranasal sinuses: Trace ethmoid sinus mucosal thickening. Tiny left maxillary sinus mucous retention cyst. Left middle ear/mastoid effusion. Skeleton: Nonspecific 8 mm lucent lesion within the left aspect of the T5 vertebral body. Mild cervical spondylosis. Upper chest: No consolidation within the imaged lung apices. IMPRESSION: Examination degraded by mild motion artifact, particularly at the level of the mid to lower neck and upper chest. There is nonspecific soft tissue prominence with possible edema at the level of the glottis without definite discrete mass identified. Direct visualization should be considered for further evaluation. Apparent moderate airway narrowing at this level. Left middle ear/mastoid effusion. Trace ethmoid sinus mucosal thickening. Tiny left  maxillary sinus mucous retention cyst. Nonspecific 8 mm lucent lesion within the T5 vertebral body. Electronically Signed   By: Kellie Simmering DO   On: 10/30/2019 13:05   DG Chest Port 1 View  Result Date: 10/30/2019 CLINICAL DATA:  Short of breath. EXAM: PORTABLE CHEST 1 VIEW COMPARISON:  10/26/2019 FINDINGS: Cardiac silhouette is normal in size. No mediastinal or hilar masses. Lung volumes are low. Mild  lung base opacity noted consistent with atelectasis. Lungs are otherwise clear. No pleural effusion or pneumothorax. There is generalized increased bowel gas. Skeletal structures are demineralized but grossly intact. IMPRESSION: No acute cardiopulmonary disease. Electronically Signed   By: Lajean Manes M.D.   On: 10/30/2019 10:36   DG ESOPHAGUS W SINGLE CM (SOL OR THIN BA)  Result Date: 11/01/2019 CLINICAL DATA:  Stroke 4 months ago.  Globus sensation.  Cough. EXAM: ESOPHOGRAM/BARIUM SWALLOW TECHNIQUE: Single contrast examination was performed using  thin barium. FLUOROSCOPY TIME:  Fluoroscopy Time:  2 minutes, 0 seconds Radiation Exposure Index (if provided by the fluoroscopic device): 19.9 mGy Number of Acquired Spot Images: 0 COMPARISON:  CT neck 10/30/2019 FINDINGS: Language barrier, the patient's son was in the room and help with translation although occasionally instructions were lost in translation. I modified the protocol to try to best account for this. The pharyngeal phase of swallowing appears unremarkable for age. Today's exam was performed as a single contrast esophagram although we did achieve a double-contrast defect in series number 3 presumably due to swallowed air. There some mild secondary tertiary contractions in the esophagus on this series, obtained with the patient standing in the LPO position. Small type 1 hiatal hernia. No esophageal stricture or narrowing. A 13 mm barium tablet passed without difficulty into the stomach. Episodes of gastroesophageal reflux were noted during the exam. There is a considerable amount of proximal skate of contrast medium during swallows, a borderline appearance for nonspecific esophageal dysmotility. Mild organo-axial rotation of the stomach within the upper abdomen. IMPRESSION: 1. Gastroesophageal reflux. 2. Borderline appearance for nonspecific esophageal dysmotility, given the presence of proximal escape and initial secondary and tertiary contractions.  3. Small type 1 hiatal hernia. 4. Mild organo-axial rotation of the stomach in the upper abdomen. Electronically Signed   By: Van Clines M.D.   On: 11/01/2019 14:36     Results/Tests Pending at Time of Discharge: None  Discharge Medications:  Allergies as of 11/01/2019   No Known Allergies     Medication List    STOP taking these medications   aspirin EC 81 MG tablet     TAKE these medications   albuterol 108 (90 Base) MCG/ACT inhaler Commonly known as: VENTOLIN HFA Inhale 1-2 puffs into the lungs every 6 (six) hours as needed for wheezing or shortness of breath.   amiodarone 200 MG tablet Commonly known as: PACERONE Take 1 tablet (200 mg total) by mouth daily.   amoxicillin-clavulanate 875-125 MG tablet Commonly known as: Augmentin Take 1 tablet by mouth 2 (two) times daily for 6 days.   apixaban 5 MG Tabs tablet Commonly known as: ELIQUIS Take 1 tablet (5 mg total) by mouth 2 (two) times daily.   atorvastatin 40 MG tablet Commonly known as: LIPITOR Take 1 tablet (40 mg total) by mouth daily. What changed:   how to take this  when to take this   cetirizine 10 MG tablet Commonly known as: ZyrTEC Allergy Take 1 tablet (10 mg total) by mouth daily.   levothyroxine 88 MCG tablet Commonly known  as: SYNTHROID Take 1 tablet (88 mcg total) by mouth daily before breakfast.   omeprazole 20 MG capsule Commonly known as: PRILOSEC Take 1 capsule (20 mg total) by mouth daily.   predniSONE 10 MG tablet Commonly known as: DELTASONE Take 4 tablets (40 mg total) by mouth daily for 2 days, THEN 2 tablets (20 mg total) daily for 3 days, THEN 1 tablet (10 mg total) daily for 3 days. Start taking on: November 02, 2019       Discharge Instructions: Please refer to Patient Instructions section of EMR for full details.  Patient was counseled important signs and symptoms that should prompt return to medical care, changes in medications, dietary instructions, activity  restrictions, and follow up appointments.   Follow-Up Appointments: Follow-up Information    Rozetta Nunnery, MD. Go on 11/13/2019.   Specialty: Otolaryngology Why: at 2 PM. Contact information: [place] [address] [phone]           Gifford Shave, MD 11/01/2019, 2:56 PM PGY-1, [place]

## 2019-11-01 NOTE — Telephone Encounter (Signed)
Spoke with [name].  Advised that we would cancel Pt's pacemaker implant at this time while Pt recovers.  Will continue to monitor Pt's follow up with Dr. Lucia Gaskins in a few weeks.  If Pt has recovered at that time will reschedule her pacemaker.  Son indicates understanding.

## 2019-11-02 ENCOUNTER — Ambulatory Visit (HOSPITAL_COMMUNITY): Admission: RE | Admit: 2019-11-02 | Payer: Medicare Other | Source: Home / Self Care | Admitting: Internal Medicine

## 2019-11-02 ENCOUNTER — Ambulatory Visit: Payer: Medicare Other | Admitting: Speech Pathology

## 2019-11-02 ENCOUNTER — Telehealth: Payer: Self-pay

## 2019-11-02 ENCOUNTER — Encounter (HOSPITAL_COMMUNITY): Admission: RE | Payer: Medicare Other | Source: Home / Self Care

## 2019-11-02 SURGERY — PACEMAKER IMPLANT

## 2019-11-02 NOTE — Telephone Encounter (Signed)
  Date of diTransition Care Management Follow-up Telephone Callscharge and from where: 10/12/2019 Jamie Mitchell How have you been since you were released from the hospital? Feeling better as per daughter in law Any questions or concerns? None / will need a referral to pulmonology as per hospital discharge instructions  Items Reviewed: Did the pt receive and understand the discharge instructions provided? YES Medications obtained and verified? YES  Any new allergies since your discharge? NONE Dietary orders reviewed? Yes  Do you have support at home? Daughter in low and son  Functional Questionnaire: (I = Independent and D = Dependent) ADLs: I   Follow up appointments reviewed:  PCP Hospital f/u appt confirmed?  Scheduled to see Jamie Caldron PA-C on 06/26//2021 at 09:10 am   [facility] f/u appt confirmed?[facility] Speech on 06/11/2021and neurology on 11/07/2019  Otolaryngology appt on 11/09/2019  Are transportation arrangements needed? NO  If their condition worsens, /is the pt aware to call PCP or go to the Emergency Dept.?   Pt and daughter in law is aware if condition is worsening or start experiencing worsening of the admission symptoms any of diff breathing, SOB, chest pain, stridor,  extreme fatigue,  persistent nausea and vomiting, bleeding , severe uncontrolled pain, or visual disturbances to seek immediate attention and return to ED  Was the patient provided with contact information for the PCP's office or ED? YES given.  Was to pt encouraged to call back with questions or concerns?YES name and contact information .  Instructed to finish all prescribed med course and the need for it/ Verbalized understanding

## 2019-11-03 ENCOUNTER — Inpatient Hospital Stay (HOSPITAL_COMMUNITY): Admission: RE | Admit: 2019-11-03 | Payer: Medicare Other | Source: Ambulatory Visit

## 2019-11-03 ENCOUNTER — Ambulatory Visit: Payer: Self-pay | Admitting: *Deleted

## 2019-11-03 ENCOUNTER — Ambulatory Visit (HOSPITAL_COMMUNITY): Payer: Medicare Other

## 2019-11-06 ENCOUNTER — Other Ambulatory Visit: Payer: Self-pay

## 2019-11-06 NOTE — Patient Outreach (Signed)
Second telephone outreach attempt to obtain mRS. CMA spoke with patient and patient requested call back tomorrow afternoon.  Patient also advised to have a interpreter available due to speaking only a little [place].   Ina Homes [facility] Management Assistant [phone]

## 2019-11-07 ENCOUNTER — Ambulatory Visit: Payer: Medicare Other | Admitting: Speech Pathology

## 2019-11-07 ENCOUNTER — Other Ambulatory Visit: Payer: Self-pay

## 2019-11-07 NOTE — Patient Outreach (Signed)
Telephone outreach to patient's daughter in law to obtain mRS was successfully completed. MRS=1.  Ina Homes [facility] Management Assistant [phone]

## 2019-11-08 ENCOUNTER — Other Ambulatory Visit: Payer: Self-pay | Admitting: *Deleted

## 2019-11-08 NOTE — Patient Outreach (Signed)
[facility]) Care Management  11/08/2019  Jamie Mitchell 11-Jan-1946 [id number]   Subjective: Telephone call to patient's home  / mobile number, no answer, message in spanish, call automatically disconnected, and unable to leave a message. Telephone call from patient's designated party release/ son Jamie Mitchell), spoke with son, stated patient's name, date of birth, and address. States he remembers speaking with this RNCM in the past and is in agreement to follow up on patient's behalf at a later time.  States he is currently driving and unable to talk at this time.    Objective: Per KPN (Knowledge Performance Now, point of care tool) and chart review,patient hospitalized 10/30/2019 -11/01/2019 for shortness of breath.    Patient hospitalized 07/25/2019 - 08/03/2019 forLeft middle cerebral artery strokerehab. Patient hospitalized 07/19/2019 - 10/31/6293 for Embolic stroke involving left middle cerebral artery, status post tPA,mechanical thrombectomy, due toAtrial fibrillation. Patient also has a history of Hypothyroidism,Hypertension,Prediabetes,Aphasia as late effect of cerebrovascular accident, and Dysphagia.   Assessment: Received [facility] Liaison referral on 08/03/2019. Referral source: Natividad Brood. Referral reason: This was a hospital referral from the nutritionist/patient on [employer] family needed teaching and support -Has home health; [facility] is TOC.  Please assign to [facility] Coordinator for complex care and disease management follow up calls, patient is aphasic and speaks/understands Spanish, her son is the contact person and speaks fluent [place], his name is Jamie Mitchell, (Son) [phone] and assess for further needs. Patient was in the rehab center.Screening follow up completed,hasreferredto Education officer, museum for [employer] follow up, telephone assessment pending,andRNCM will continue to follow for  care management needs.    Plan: RNCM will call patient's son/ designated party release for telephone outreach attempt, within 4 business days, assessment follow up. RNCM has  sent resource letter to patient's son with the following education material per his request: Aphasia (EMMI handout).       Lucetta Baehr H. Annia Friendly, BSN, [facility] Telephonic CM Phone: [phone] Fax: [phone]

## 2019-11-09 ENCOUNTER — Ambulatory Visit: Payer: Medicare Other | Admitting: Speech Pathology

## 2019-11-09 NOTE — Progress Notes (Signed)
Patient ID: Jamie Mitchell, female   DOB: 21-May-1946, 74 y.o.   MRN: [id number]       Jamie Mitchell, is a 74 y.o. female  [id number]  [id number]  DOB - 1946-04-13  Subjective:  Chief Complaint and HPI: Jamie Mitchell is a 74 y.o. female here today for a follow up visit After hospitalization 6/7-11/01/2019 for subglottic edema with stridor.   She saw ENT in follow up Monday this week and has also had her follow-up CT scan.  The ENT placed her on another round of Amoxicillin.  She has now finished her prednisone.  Throat pain is improving.  appetite is good.  Swallowing is improving.    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 female presenting with shortness of breath. 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  Below is her hospital course by problem list.  Subglottic edema with stridor Patient presented to the emergency department with shortness of breath and stridor.  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  She was given racemic epi in the emergency department.  She was kept n.p.o. overnight and her stridor improved.  ENT continue to follow after admission and reassessed her epiglottic swelling.  They recommended starting her on a steroid taper as well as antibiotics for 1 week.  She was also evaluated by speech who recommended an esophageal swallow study.  Esophagram swallow study showed gastroesophageal reflux with borderline appearance for nonspecific esophageal dysmotility, given the presence of proximal escape and initial secondary and tertiary contractions.  There was a small type I hiatal  hernia and mild organoaxial rotation of the stomach in the upper abdomen.  Patient's respiratory status improved and stridor decreased.  Patient was discharged on a prednisone taper and Augmentin for 6 additional days.  She was scheduled with follow-up with her ENT.  They will be scheduling a repeat CT neck to evaluate for resolution of subglottic edema.  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  Her Eliquis and aspirin were held on admission out of concern that she may need a procedure.  Her Eliquis was restarted after it was determined she would not be going to the OR but it was determined that there was no need for her to continue her aspirin.    Issues for Follow Up:  1.  Nonspecific 8 mm lucency in T5 vertebral body 2. Repeat CT Head/Neck 6/15 (10 days from prior exam) to assess for effectiveness of treatment, Dr. Lucia Mitchell will schedule this 3.  Follow-up appointment with Dr. Lucia Mitchell 4.  Follow-up with pulmonologist, may need referral from PCP or Dr. Lucia Mitchell 5.  Follow-up with gastroenterology regarding esophageal dysmotility and hiatal hernia   From  CT scan 11/10/2019 on follow-up: IMPRESSION: 1. Marked improvement in soft tissue at the level of the vocal cords without residual or recurrent mass lesion. 2. There remains some retro laryngeal fullness without discrete mass lesion. Endoscopy may still be indicated for direct visualization. 3. No significant adenopathy. 4. Diffuse sclerotic changes in the left mastoid likely represents the sequela of chronic inflammation. 5. 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  ED/Hospital notes reviewed.    ROS:   Constitutional:  No f/c, No night sweats, No unexplained weight loss. EENT:  No vision changes, No blurry vision, No hearing changes. No additional mouth, throat, or ear problems.  Respiratory: No cough, No SOB Cardiac:  No CP, no palpitations GI:  No abd pain, No N/V/D. GU: No Urinary s/sx Musculoskeletal: No joint pain Neuro: No headache, no dizziness, no motor weakness.  Skin: No rash Endocrine:  No polydipsia. No polyuria.  Psych: Denies SI/HI  No problems updated.  ALLERGIES: No Known Allergies  PAST MEDICAL HISTORY: Past Medical History:  Diagnosis Date  . Atrial fibrillation ([place])   . Hypertension   . Stroke ([place])   . Thyroid disease     MEDICATIONS AT HOME: Prior to Admission medications   Medication Sig Start Date End Date Taking? Authorizing Provider  albuterol (VENTOLIN HFA) 108 (90 Base) MCG/ACT inhaler Inhale 1-2 puffs into the lungs every 6 (six) hours as needed for wheezing or shortness of breath. 10/09/19   Jaynee Eagles, PA-C  amiodarone (PACERONE) 200 MG tablet Take 1 tablet (200 mg total) by mouth daily. 09/14/19   Donato Heinz, MD  apixaban (ELIQUIS) 5 MG TABS tablet Take 1 tablet (5 mg total) by mouth 2 (two) times daily. 10/02/19   Evans Lance, MD  atorvastatin (LIPITOR) 40 MG tablet Take 1 tablet (40 mg total) by mouth daily. Patient taking differently: 40 mg every evening.  08/02/19   Angiulli, Lavon Paganini, PA-C  cetirizine (ZYRTEC ALLERGY) 10 MG tablet Take 1 tablet (10 mg total) by mouth daily. 10/13/19   Jaynee Eagles, PA-C  diclofenac Sodium (VOLTAREN) 1 % GEL Apply 2 g topically 4 (four) times daily. 11/15/19   Argentina Donovan, PA-C  levothyroxine (SYNTHROID) 88 MCG tablet Take 1 tablet (88 mcg total) by mouth daily before breakfast. 09/08/19   Charlott Rakes, MD  omeprazole (PRILOSEC) 20 MG capsule Take 1 capsule (20 mg total) by mouth daily. 10/26/19   Little, Wenda Overland, MD  famotidine (PEPCID) 20 MG tablet Take 1 tablet (20 mg total) by mouth 2 (two) times daily. Patient not taking: Reported on 10/24/2019 10/13/19 10/26/19  Jaynee Eagles, PA-C     Objective:  EXAM:   Vitals:   11/15/19 0954  BP: 134/81  Pulse: 64  Resp: 16  Temp: 97.7 F (36.5 C)  SpO2:  97%  Weight: 146 lb 12.8 oz (66.6 kg)    General appearance : A&OX3. NAD. Non-toxic-appearing.  Voice is hoars HEENT: Atraumatic and Normocephalic.  PERRLA. EOM intact.  TM clear B. Mouth-MMM, post pharynx WNL w/o erythema, edema No PND. Neck: supple, no JVD. No cervical lymphadenopathy. No thyromegaly Chest/Lungs:  Breathing-non-labored, Good air entry bilaterally, breath sounds normal without rales, rhonchi, or wheezing  CVS: S1 S2 regular, no murmurs, gallops, rubs  Neurology:  CN II-XII grossly intact, Non focal.   Psych:  TP linear. J/I WNL. Normal speech. Appropriate eye contact and affect.  Skin:  No Rash  Data Review Lab Results  Component Value Date   HGBA1C 6.1 (H) 07/20/2019   HGBA1C 6.0 (H) 07/20/2019   HGBA1C 6.3 07/31/2015     Assessment & Plan   1. Subglottic edema  resolving  2. Other specified hypothyroidism Will check TSH at next visit.  (will wait due to so many recent meds and steroids that may affect labs)   3. Hospital discharge follow-up Much improved  4. Language barrier [employer] interpreters used and additional time performing visit  was required.   5. Hyperglycemia I have had a lengthy discussion and provided education about insulin resistance and the intake of too much sugar/refined carbohydrates.  I have advised the patient to work at a goal of eliminating sugary drinks, candy, desserts, sweets, refined sugars, processed foods, and white carbohydrates.  The patient expresses understanding.  Check A1C next visit(will wait due to so many recent meds and steroids that may affect labs)   Patient have been counseled extensively about nutrition and exercise  Return in about 6 weeks (around 12/27/2019) for Dr newlin/PCP for bloodwork/A1C and check TSH.  The patient was given clear instructions to go to ER or return to medical center if symptoms don't improve, worsen or new problems develop. The patient verbalized understanding. The patient was told to call  to get lab results if they haven't heard anything in the next week.     [name], PA-C [facility] [place], [place]   11/15/2019, 10:11 AM

## 2019-11-10 ENCOUNTER — Ambulatory Visit: Payer: Self-pay | Admitting: *Deleted

## 2019-11-10 ENCOUNTER — Ambulatory Visit (HOSPITAL_COMMUNITY)
Admission: RE | Admit: 2019-11-10 | Discharge: 2019-11-10 | Disposition: A | Discharge status: Home / Self Care | Payer: Medicare Other | Source: Ambulatory Visit | Attending: Otolaryngology | Admitting: Otolaryngology

## 2019-11-10 ENCOUNTER — Other Ambulatory Visit: Payer: Self-pay

## 2019-11-10 DIAGNOSIS — J392 Other diseases of pharynx: Secondary | ICD-10-CM | POA: Diagnosis not present

## 2019-11-10 IMAGING — CT CT NECK W/ CM
3 of 4 series · 14 of 33 positions shown, 17 images · IV contrast (OMNIPAQUE)
Comparison: CT neck with contrast [DATE].

CLINICAL DATA: Benign neoplasm of the mouth. Right-sided subglottic
fullness despite antibiotics. Pharyngeal mass. Follow-up previous
abnormal CT of the neck.

EXAM:
CT NECK WITH CONTRAST
TECHNIQUE: Multidetector CT imaging of the neck was performed using the
standard protocol following the bolus administration of intravenous
contrast.
CONTRAST:  75mL OMNIPAQUE IOHEXOL 300 MG/ML  SOLN

[Series 5: orthogonal ax · axial · 0.39mm/px · z∈[+1432,+1600]mm · 6 of 125 slices shown, 8 images]
[im 18/125  soft-tissue]
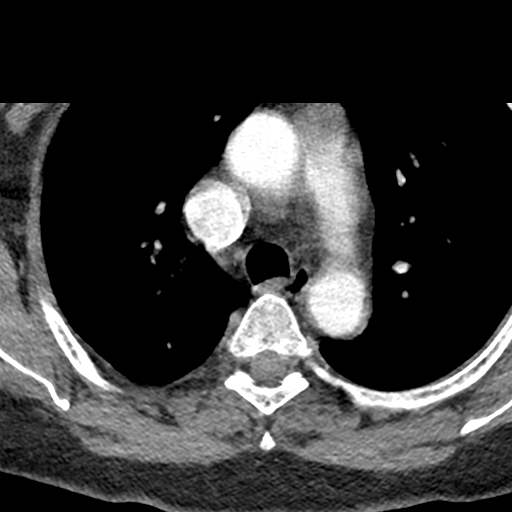
[im 18/125  bone]
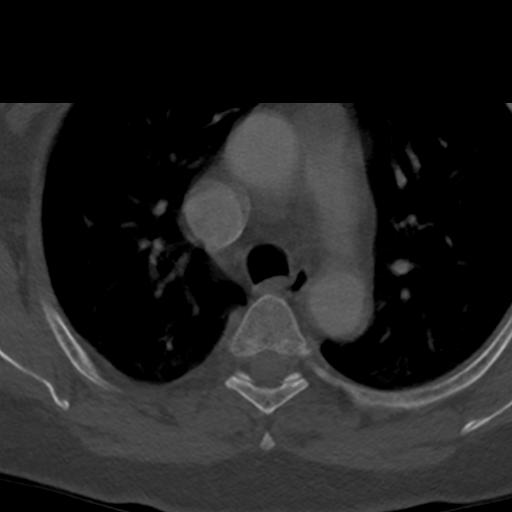
[im 36/125  bone]
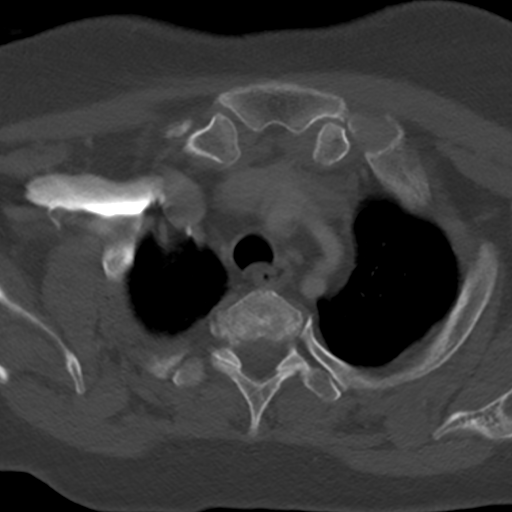
[im 54/125  bone]
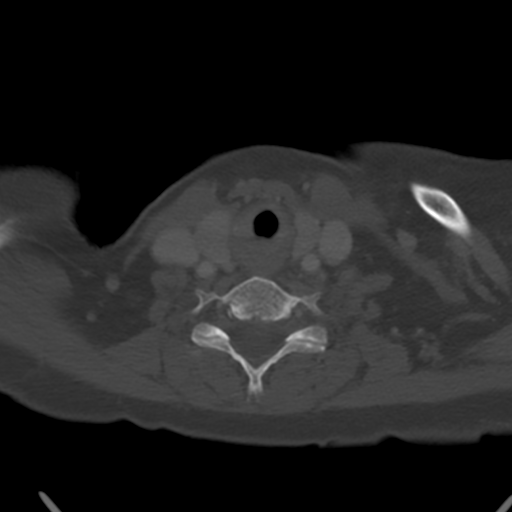
[im 71/125  bone]
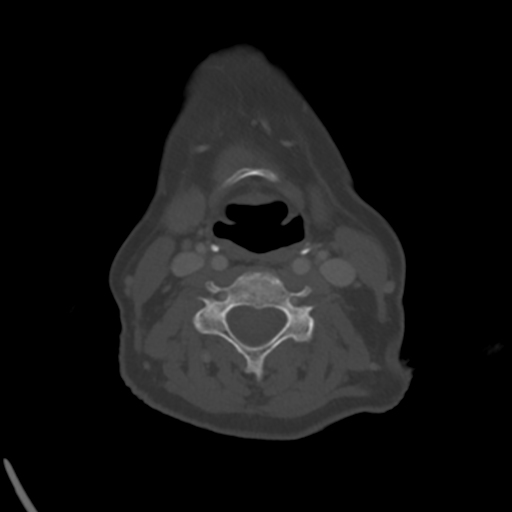
[im 89/125  soft-tissue]
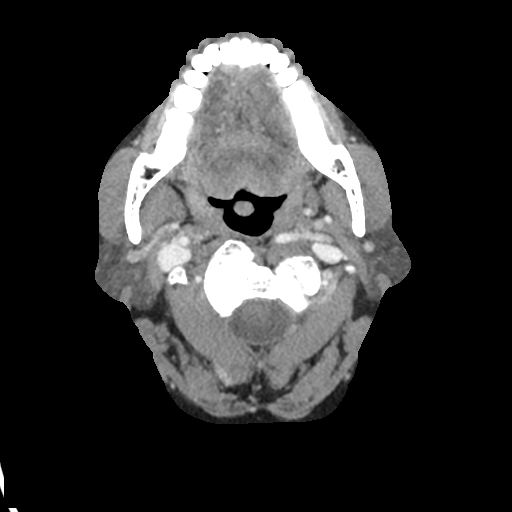
[im 89/125  bone]
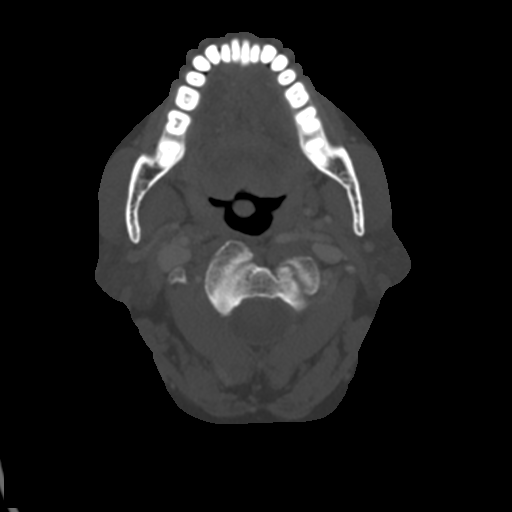
[im 107/125  bone]
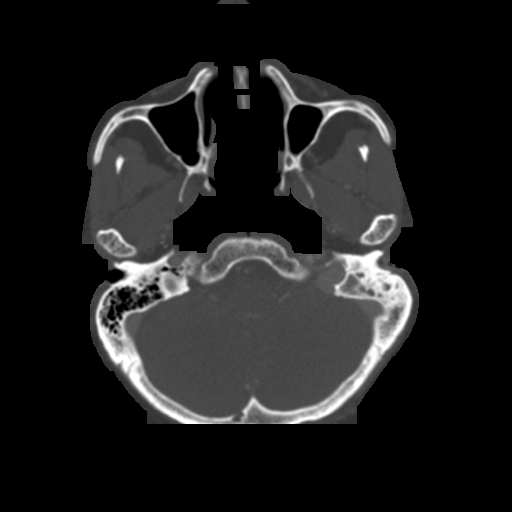

[Series 6: cor neck · coronal · 0.51mm/px · 3 of 94 slices shown]
[im 26/94  bone]
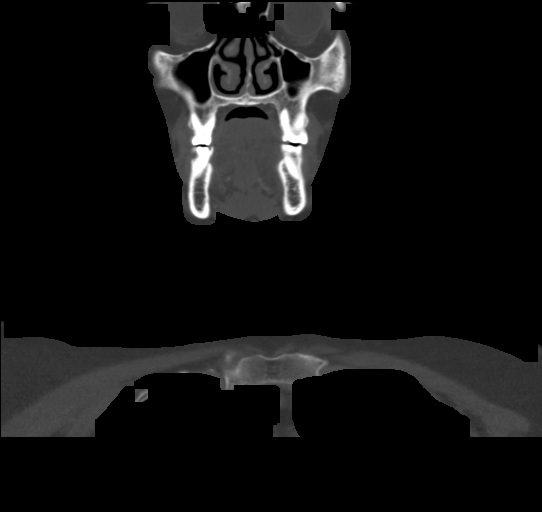
[im 40/94  bone]
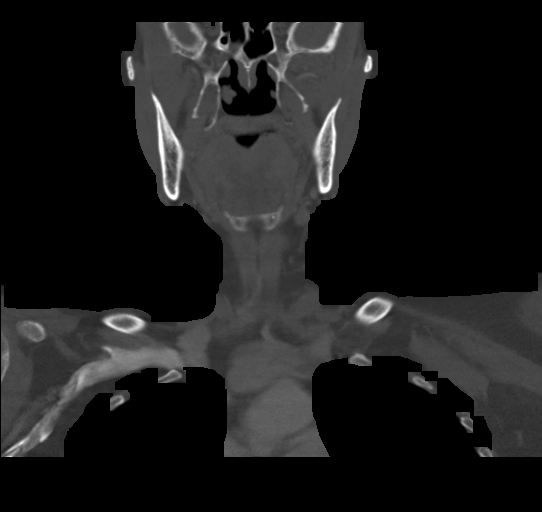
[im 54/94  bone]
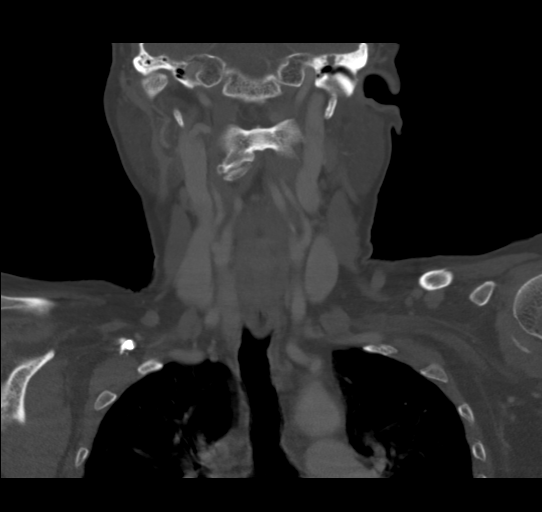

[Series 7: sag neck · sagittal · 0.48mm/px · 5 of 72 slices shown, 6 images]
[im 24/72  bone]
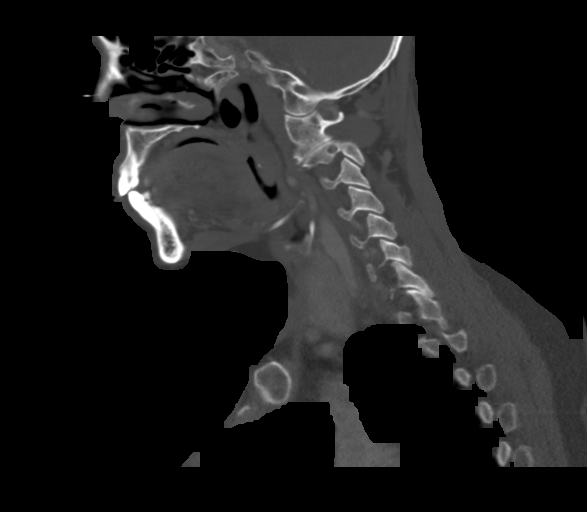
[im 30/72  bone]
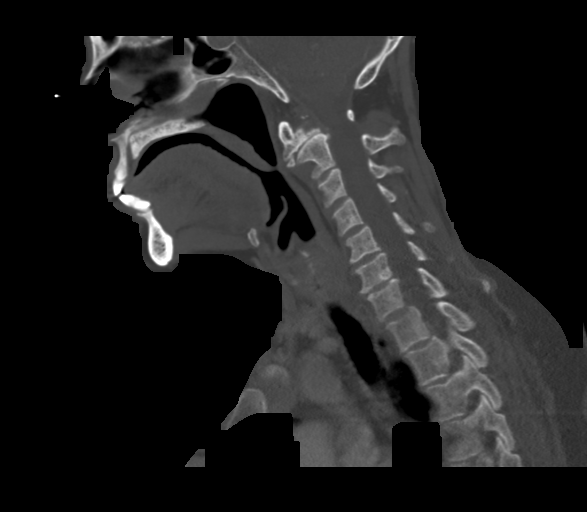
[im 36/72  soft-tissue]
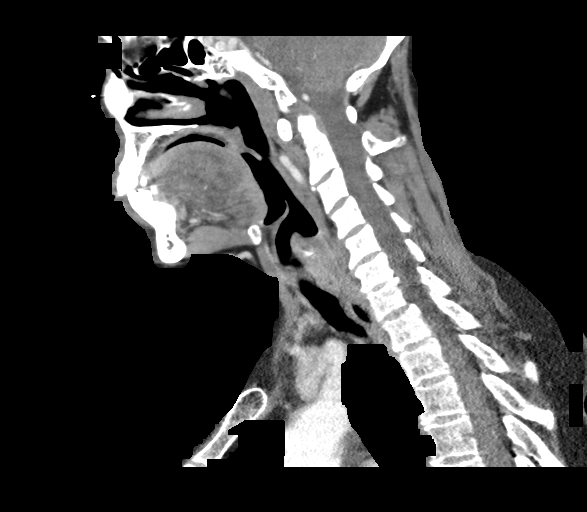
[im 36/72  bone]
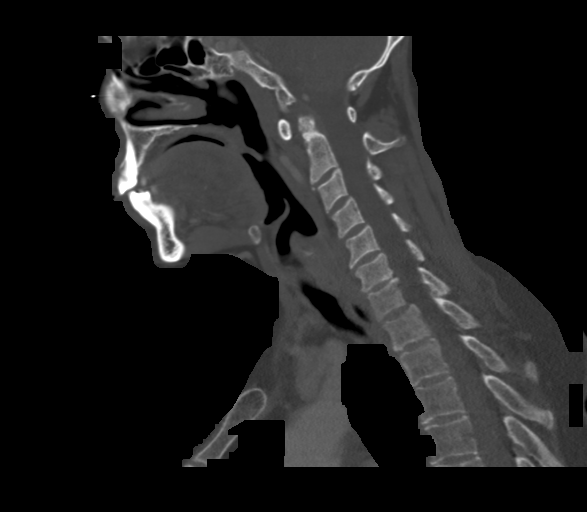
[im 42/72  bone]
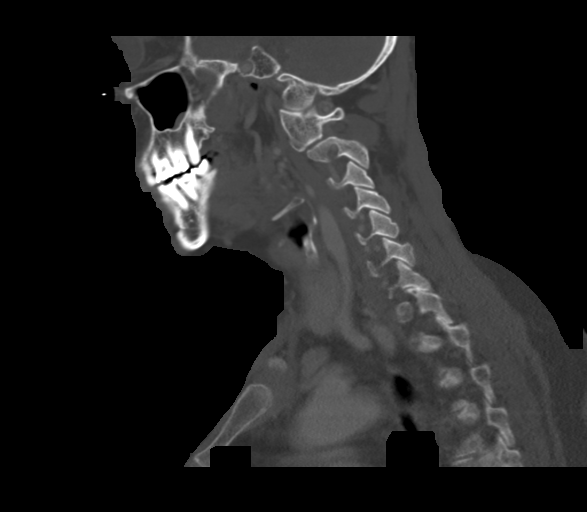
[im 48/72  bone]
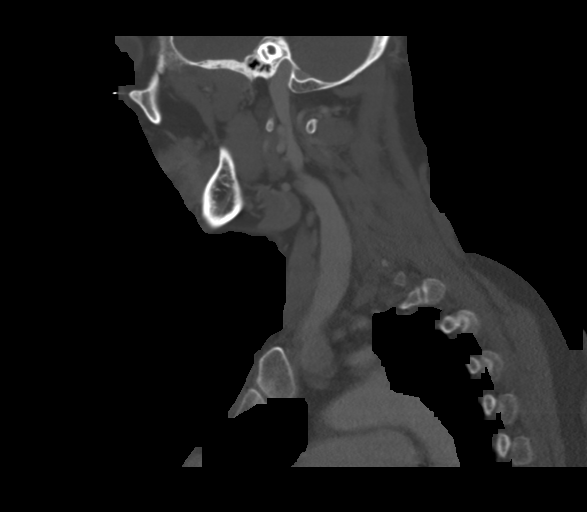

[14 of 33 positions shown; findings below may reference images not displayed]

FINDINGS: Pharynx and larynx: Nasopharynx is clear. Soft palate and tongue
base are within normal limits. Palatine tonsils and parapharyngeal
fat are within normal limits. Epiglottis is normal. Hypopharynx is
clear.

Previously noted soft tissue at the level of the vocal cords has
significantly improved. No residual mass lesion is present. There
remains some retro laryngeal fullness. No discrete mass is present.
No inflammatory changes are noted. Trachea is within normal limits.

Salivary glands: The submandibular glands and ducts are within
normal limits. Parotid glands are normal bilaterally.

Thyroid: Normal.

Lymph nodes: No significant adenopathy is present.

Vascular: Mild tortuosity is present in the cervical internal
carotid arteries bilaterally. No focal vascular disease is present.

Limited intracranial: Visualized intracranial contents are within
normal limits.

Visualized orbits: The globes and orbits are within normal limits.

Mastoids and visualized paranasal sinuses: Diffuse sclerotic changes
are present in the left mastoid. No fluid levels are present. This
likely represents the sequela of chronic inflammation. Fluid is
present in the epitympanum and residual mastoid air cells.

Skeleton: Degenerative changes of the cervical spine are otherwise
stable.

Upper chest: The lung apices are clear. Thoracic inlet is within
normal limits.
IMPRESSION: 1. Marked improvement in soft tissue at the level of the vocal cords
without residual or recurrent mass lesion.
2. There remains some retro laryngeal fullness without discrete mass
lesion. Endoscopy may still be indicated for direct visualization.
3. No significant adenopathy.
4. Diffuse sclerotic changes in the left mastoid likely represents
the sequela of chronic inflammation.
5. Fluid in the epitympanum and residual mastoid air cells on the
left suggests ongoing middle ear and mastoid disease.

## 2019-11-10 MED ORDER — IOHEXOL 300 MG/ML  SOLN
75.0000 mL | Freq: Once | INTRAMUSCULAR | Status: AC | PRN
  Administered 2019-11-10: 75 mL via INTRAVENOUS

## 2019-11-10 MED ORDER — SODIUM CHLORIDE (PF) 0.9 % IJ SOLN
INTRAMUSCULAR | Status: AC
  Filled 2019-11-10: qty 50

## 2019-11-13 ENCOUNTER — Other Ambulatory Visit: Payer: Self-pay

## 2019-11-13 ENCOUNTER — Ambulatory Visit (INDEPENDENT_AMBULATORY_CARE_PROVIDER_SITE_OTHER): Payer: Medicare Other | Admitting: Otolaryngology

## 2019-11-13 ENCOUNTER — Encounter (INDEPENDENT_AMBULATORY_CARE_PROVIDER_SITE_OTHER): Payer: Self-pay | Admitting: Otolaryngology

## 2019-11-13 VITALS — Temp 97.5°F

## 2019-11-13 DIAGNOSIS — I639 Cerebral infarction, unspecified: Secondary | ICD-10-CM

## 2019-11-13 DIAGNOSIS — J040 Acute laryngitis: Secondary | ICD-10-CM | POA: Diagnosis not present

## 2019-11-13 NOTE — Progress Notes (Signed)
HPI: Jamie Mitchell is a 74 y.o. female who returns today for evaluation of throat complaints.  She presents today with her interpreter as well as a relative who lives at home with her.  Her breathing is doing much better since she was discharged from the hospital.  She had a follow-up CT scan last week that showed resolution of the majority of the swelling.  She still complains of a little bit of a sore throat but is having no difficulty eating or swallowing.  She has no airway problems in the office with essentially normal voice. She is on a blood thinner for previous history of stroke.[name]  Past Medical History:  Diagnosis Date  . Atrial fibrillation ([place])   . Hypertension   . Stroke ([place])   . Thyroid disease    Past Surgical History:  Procedure Laterality Date  . IR CT HEAD LTD  07/20/2019  . IR PERCUTANEOUS ART THROMBECTOMY/INFUSION INTRACRANIAL INC DIAG ANGIO  07/20/2019  . RADIOLOGY WITH ANESTHESIA N/A 07/20/2019   Procedure: IR WITH ANESTHESIA;  Surgeon: Luanne Bras, MD;  Location: [facility];  Service: Radiology;  Laterality: N/A;   Social History   Socioeconomic History  . Marital status: Married    Spouse name: Not on file  . Number of children: Not on file  . Years of education: Not on file  . Highest education level: Not on file  Occupational History  . Not on file  Tobacco Use  . Smoking status: Never Smoker  . Smokeless tobacco: Never Used  Substance and Sexual Activity  . Alcohol use: No  . Drug use: No  . Sexual activity: Not Currently    Birth control/protection: None    Comment: Married  Other Topics Concern  . Not on file  Social History Narrative   Lives with son [name])    Social Determinants of Health   Financial Resource Strain:   . Difficulty of Paying Living Expenses:   Food Insecurity:   . Worried About Charity fundraiser in the Last Year:   . Arboriculturist in the Last Year:   Transportation Needs: No Transportation Needs  . Lack of  Transportation (Medical): No  . Lack of Transportation (Non-Medical): No  Physical Activity:   . Days of Exercise per Week:   . Minutes of Exercise per Session:   Stress:   . Feeling of Stress :   Social Connections:   . Frequency of Communication with Friends and Family:   . Frequency of Social Gatherings with Friends and Family:   . Attends Religious Services:   . Active Member of Clubs or Organizations:   . Attends Archivist Meetings:   [name] Marital Status:    Family History  Family history unknown: Yes   No Known Allergies Prior to Admission medications   Medication Sig Start Date End Date Taking? Authorizing Provider  albuterol (VENTOLIN HFA) 108 (90 Base) MCG/ACT inhaler Inhale 1-2 puffs into the lungs every 6 (six) hours as needed for wheezing or shortness of breath. 10/09/19  Yes Jaynee Eagles, PA-C  amiodarone (PACERONE) 200 MG tablet Take 1 tablet (200 mg total) by mouth daily. 09/14/19  Yes Donato Heinz, MD  apixaban (ELIQUIS) 5 MG TABS tablet Take 1 tablet (5 mg total) by mouth 2 (two) times daily. 10/02/19  Yes Evans Lance, MD  atorvastatin (LIPITOR) 40 MG tablet Take 1 tablet (40 mg total) by mouth daily. Patient taking differently: 40 mg every evening.  08/02/19  Yes Angiulli, Lavon Paganini, PA-C  cetirizine (ZYRTEC ALLERGY) 10 MG tablet Take 1 tablet (10 mg total) by mouth daily. 10/13/19  Yes Jaynee Eagles, PA-C  levothyroxine (SYNTHROID) 88 MCG tablet Take 1 tablet (88 mcg total) by mouth daily before breakfast. 09/08/19  Yes Newlin, Enobong, MD  omeprazole (PRILOSEC) 20 MG capsule Take 1 capsule (20 mg total) by mouth daily. 10/26/19  Yes Little, Wenda Overland, MD  famotidine (PEPCID) 20 MG tablet Take 1 tablet (20 mg total) by mouth 2 (two) times daily. Patient not taking: Reported on 10/24/2019 10/13/19 10/26/19  Jaynee Eagles, PA-C     Positive ROS: Otherwise negative  All other systems have been reviewed and were otherwise negative with the exception of those  mentioned in the HPI and as above.  Physical Exam: Constitutional: Alert, well-appearing, no acute distress patient is breathing comfortably with no stridor on deep inhalation or exhalation. Ears: External ears without lesions or tenderness. Ear canals are clear bilaterally with intact, clear TMs.  Nasal: External nose without lesions. Septum slightly deviated to the left with mild rhinitis.. Clear nasal passages bilaterally with no signs of infection. Oral: Lips and gums without lesions. Tongue and palate mucosa without lesions. Posterior oropharynx clear. Fiberoptic laryngoscopy was performed to the right nostril.  The nasopharynx was clear.  The base of tongue was clear.  Epiglottis was normal.  Vocal cords were clear bilaterally with normal vocal mobility.  Glottis was widely patent with no obvious subglottic swelling.  Of note she did have a little bit of redundant tissue in the region of the right false cord but this is benign in appearance. Neck: No palpable adenopathy or masses.  No swelling in the neck or palpable adenopathy noted. Respiratory: Breathing comfortably  Skin: No facial/neck lesions or rash noted.  Laryngoscopy  Date/Time: 11/13/2019 5:31 PM Performed by: Rozetta Nunnery, MD Authorized by: Rozetta Nunnery, MD   Consent:    Consent obtained:  Verbal   Consent given by:  Patient Procedure details:    Indications: direct visualization of the upper aerodigestive tract     Medication:  Afrin   Instrument: flexible fiberoptic laryngoscope     Scope location: right nare   Sinus:    Right nasopharynx: normal   Mouth:    Oropharynx: normal     Vallecula: normal     Base of tongue: normal     Epiglottis: normal   Throat:    True vocal cords: normal   Comments:     Patient with a small amount of redundant tissue in the region of the right false cord.  Vocal cords were clear with normal mobility bilaterally.  Glottis and subglottis were  clear.    Assessment: Resolved laryngeal edema and inflammation on clinical exam as well as CT scan.  Plan: Prescribed amoxicillin 875 mg twice daily for another 2 weeks as well as omeprazole 40 mg daily for the next month before dinner. She will follow-up if she notices any further trouble breathing. If symptoms recur will probably require direct laryngoscopy under general anesthesia.   Radene Journey, MD

## 2019-11-14 ENCOUNTER — Ambulatory Visit: Payer: Medicare Other

## 2019-11-15 ENCOUNTER — Other Ambulatory Visit: Payer: Self-pay

## 2019-11-15 ENCOUNTER — Ambulatory Visit (INDEPENDENT_AMBULATORY_CARE_PROVIDER_SITE_OTHER): Payer: Medicare Other | Admitting: Cardiology

## 2019-11-15 ENCOUNTER — Ambulatory Visit (HOSPITAL_BASED_OUTPATIENT_CLINIC_OR_DEPARTMENT_OTHER): Payer: Medicare Other | Admitting: Physician Assistant

## 2019-11-15 ENCOUNTER — Other Ambulatory Visit: Payer: Self-pay | Admitting: Physician Assistant

## 2019-11-15 ENCOUNTER — Encounter: Payer: Self-pay | Admitting: Cardiology

## 2019-11-15 VITALS — BP 124/78 | HR 63 | Ht 59.0 in | Wt 148.2 lb

## 2019-11-15 VITALS — BP 134/81 | HR 64 | Temp 97.7°F | Resp 16 | Wt 146.8 lb

## 2019-11-15 DIAGNOSIS — Z09 Encounter for follow-up examination after completed treatment for conditions other than malignant neoplasm: Secondary | ICD-10-CM | POA: Diagnosis not present

## 2019-11-15 DIAGNOSIS — I639 Cerebral infarction, unspecified: Secondary | ICD-10-CM | POA: Diagnosis not present

## 2019-11-15 DIAGNOSIS — I455 Other specified heart block: Secondary | ICD-10-CM | POA: Diagnosis not present

## 2019-11-15 DIAGNOSIS — Z20822 Contact with and (suspected) exposure to covid-19: Secondary | ICD-10-CM | POA: Diagnosis not present

## 2019-11-15 DIAGNOSIS — R739 Hyperglycemia, unspecified: Secondary | ICD-10-CM | POA: Insufficient documentation

## 2019-11-15 DIAGNOSIS — K449 Diaphragmatic hernia without obstruction or gangrene: Secondary | ICD-10-CM | POA: Insufficient documentation

## 2019-11-15 DIAGNOSIS — I10 Essential (primary) hypertension: Secondary | ICD-10-CM | POA: Insufficient documentation

## 2019-11-15 DIAGNOSIS — J9600 Acute respiratory failure, unspecified whether with hypoxia or hypercapnia: Secondary | ICD-10-CM | POA: Diagnosis not present

## 2019-11-15 DIAGNOSIS — I4891 Unspecified atrial fibrillation: Secondary | ICD-10-CM

## 2019-11-15 DIAGNOSIS — J384 Edema of larynx: Secondary | ICD-10-CM

## 2019-11-15 DIAGNOSIS — Z789 Other specified health status: Secondary | ICD-10-CM | POA: Diagnosis not present

## 2019-11-15 DIAGNOSIS — Z79899 Other long term (current) drug therapy: Secondary | ICD-10-CM | POA: Insufficient documentation

## 2019-11-15 DIAGNOSIS — E038 Other specified hypothyroidism: Secondary | ICD-10-CM | POA: Insufficient documentation

## 2019-11-15 DIAGNOSIS — K224 Dyskinesia of esophagus: Secondary | ICD-10-CM | POA: Insufficient documentation

## 2019-11-15 DIAGNOSIS — Z8673 Personal history of transient ischemic attack (TIA), and cerebral infarction without residual deficits: Secondary | ICD-10-CM | POA: Insufficient documentation

## 2019-11-15 DIAGNOSIS — I4892 Unspecified atrial flutter: Secondary | ICD-10-CM | POA: Diagnosis not present

## 2019-11-15 DIAGNOSIS — G8191 Hemiplegia, unspecified affecting right dominant side: Secondary | ICD-10-CM | POA: Diagnosis not present

## 2019-11-15 DIAGNOSIS — E785 Hyperlipidemia, unspecified: Secondary | ICD-10-CM | POA: Diagnosis not present

## 2019-11-15 DIAGNOSIS — Z7901 Long term (current) use of anticoagulants: Secondary | ICD-10-CM | POA: Insufficient documentation

## 2019-11-15 MED ORDER — DICLOFENAC SODIUM 1 % EX GEL: 2.0000 g | Freq: Four times a day (QID) | CUTANEOUS | 3 refills | Status: DC

## 2019-11-15 NOTE — Patient Instructions (Signed)
  Dolor de garganta Sore Throat Cuando tiene dolor de [place], puede sentir en ella:  Sensibilidad.  Ardor.  Irritacin.  Aspereza.  Dolor al tragar.  Dolor al hablar. Muchas cosas pueden causar dolor de garganta, como las siguientes:  Infeccin.  Alergias.  Aire seco.  Humo o contaminacin.  Radioterapia.  Enfermedad de reflujo gastroesofgico (ERGE).  Un tumor. El dolor de garganta puede ser el primer signo de otra enfermedad. Puede estar acompaado de otros problemas, como estos:  Tos.  Estornudos.  Cristy Hilts.  Hinchazn en el cuello. La mayora de los dolores de garganta desaparecen sin tratamiento. Siga estas indicaciones en su casa:      Tome los medicamentos de venta libre solamente como se lo haya indicado el mdico. ? Si el nio tiene dolor de Investment banker, operational, no le d aspirina.  Beba suficiente lquido para Contractor pis (la orina) de color amarillo plido.  Descanse cuando tenga la necesidad de [place].  Para ayudar a Theatre stage manager dolor: ? Beba lquidos tibios, como caldos, infusiones a base de hierbas o agua tibia. ? Coma o beba lquidos fros o congelados, tales como paletas heladas. ? Haga grgaras con una mezcla de agua y sal 3 o 4veces al da, o cuando sea necesario. Para preparar la mezcla de agua con sal, agregue de a  a 1cucharadita (de 3 a 6g) de sal en 1taza (24ml) de agua tibia. Mezcle hasta que no pueda ver ms la sal. ? Chupe caramelos duros o pastillas para la garganta. ? Ponga un humidificador de vapor fro en su habitacin durante la noche. ? Abra el agua caliente de la ducha y sintese en el bao con la puerta cerrada durante 5a70minutos.  No consuma ningn producto que contenga nicotina o tabaco, como cigarrillos, cigarrillos electrnicos y tabaco de Higher education careers adviser. Si necesita ayuda para dejar de fumar, consulte al mdico.  Lvese bien las manos con agua y jabn frecuentemente. Use desinfectante para manos si no dispone de [place] y  [place]. Comunquese con un mdico si:  Tiene fiebre por ms de 2 a 3das.  Sigue teniendo sntomas durante ms de 2 o 3das.  La garganta no le mejora en 7 das.  Tiene fiebre y los sntomas empeoran repentinamente.  Tiene un nio de 3 meses a 3 aos de edad que presenta fiebre de 102.50F (39C) o ms. Solicite ayuda inmediatamente si:  Tiene dificultad para respirar.  No puede tragar lquidos, alimentos blandos o la saliva.  Tiene hinchazn que empeora en la garganta o en el cuello.  Siente malestar estomacal (nuseas) constante.  No deja de vomitar. Resumen  El dolor de garganta es dolor, ardor, irritacin o sensacin de Dietitian. Muchas cosas pueden causar dolor de garganta.  Tome los medicamentos de venta libre solamente como se lo haya indicado el mdico. No le d aspirina al [place].  Beba mucho lquido y haga el reposo necesario.  Comunquese con el mdico si los sntomas empeoran o si el dolor de garganta no mejora en el trmino de 7das. Esta informacin no tiene Marine scientist el consejo del mdico. Asegrese de hacerle al mdico cualquier pregunta que tenga. Document Revised: 11/18/2017 Document Reviewed: 11/18/2017 Elsevier Patient Education  [place].

## 2019-11-15 NOTE — Patient Instructions (Addendum)
Medication Instructions:  Your physician recommends that you continue on your current medications as directed. Please refer to the Current Medication list given to you today.  *If you need a refill on your cardiac medications before your next appointment, please call your pharmacy*  Follow-Up: At [facility], you and your health needs are our priority.  As part of our continuing mission to provide you with exceptional heart care, we have created designated Provider Care Teams.  These Care Teams include your primary Cardiologist (physician) and Advanced Practice Providers (APPs -  Physician Assistants and Nurse Practitioners) who all work together to provide you with the care you need, when you need it.  We recommend signing up for the patient portal called "MyChart".  Sign up information is provided on this After Visit Summary.  MyChart is used to connect with patients for Virtual Visits (Telemedicine).  Patients are able to view lab/test results, encounter notes, upcoming appointments, etc.  Non-urgent messages can be sent to your provider as well.   To learn more about what you can do with MyChart, go to [email or web address].    Your next appointment:   Monday 12/6 at 2:20 pm   The format for your next appointment:   In Person  Provider:   Oswaldo Milian, MD

## 2019-11-15 NOTE — Progress Notes (Signed)
Cardiology Office Note:    Date:  11/15/2019   ID:  Jamie Mitchell, Uhde 14-Nov-1945, MRN [id number]  PCP:  Charlott Rakes, MD  Cardiologist:  Donato Heinz, MD  Electrophysiologist:  None   Referring MD: Orlando Penner*   Chief Complaint  Patient presents with  . Atrial Fibrillation    History of Present Illness:    Jamie Mitchell is a 74 y.o. female with a hx of hypertension, HLD, and thyroid disease  who presents for follow-up.  Patient moved from [place] to [place] last month.  She was admitted on 07/19/2019 to [facility] with acute CVA.  TPA was administered, and CTA head showed M2 occlusion.  Thrombectomy and revascularization were complicated by subarachnoid hemorrhage.  She was admitted to the neuro ICU.  Course was complicated by intermittent atrial fibrillation/atrial flutter with RVR with postconversion pauses up to 8 seconds.  EP was consulted, and she was started on amiodarone.  She was discharged on Eliquis 5 mg twice daily and amiodarone 200 mg twice daily.  TTE during admission showed EF 60 to 65%, normal RV function, mild MR. After discharge from hospital, a Zio patch x14 days was done, which showed numerous sinus pauses, longest lasting 3.3 seconds.  She was referred to EP and PPM was planned.  However, this was put on hold as she was admitted to [facility] from 6/7 through 11/01/2019 with shortness of breath and stridor.  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  She received epinephrine in the ED.  She was discharged on antibiotics and a steroid taper.  Since she was discharged from the hospital, she reports her dyspnea has resolved.  She denies any syncope or presyncope.  Denies chest pain, lightheadedness, or palpitations.  She is taking Eliquis, denies any bleeding issues.   Past Medical History:  Diagnosis Date  . Atrial fibrillation ([place])   . Hypertension   . Stroke ([place])   . Thyroid disease     Past  Surgical History:  Procedure Laterality Date  . IR CT HEAD LTD  07/20/2019  . IR PERCUTANEOUS ART THROMBECTOMY/INFUSION INTRACRANIAL INC DIAG ANGIO  07/20/2019  . RADIOLOGY WITH ANESTHESIA N/A 07/20/2019   Procedure: IR WITH ANESTHESIA;  Surgeon: Luanne Bras, MD;  Location: [place];  Service: Radiology;  Laterality: N/A;    Current Medications: Current Meds  Medication Sig  . albuterol (VENTOLIN HFA) 108 (90 Base) MCG/ACT inhaler Inhale 1-2 puffs into the lungs every 6 (six) hours as needed for wheezing or shortness of breath.  [name] amiodarone (PACERONE) 200 MG tablet Take 1 tablet (200 mg total) by mouth daily.  [name] apixaban (ELIQUIS) 5 MG TABS tablet Take 1 tablet (5 mg total) by mouth 2 (two) times daily.  [name] atorvastatin (LIPITOR) 40 MG tablet Take 1 tablet (40 mg total) by mouth daily. (Patient taking differently: 40 mg every evening. )  . cetirizine (ZYRTEC ALLERGY) 10 MG tablet Take 1 tablet (10 mg total) by mouth daily.  . diclofenac Sodium (VOLTAREN) 1 % GEL Apply 2 g topically 4 (four) times daily.  [name] levothyroxine (SYNTHROID) 88 MCG tablet Take 1 tablet (88 mcg total) by mouth daily before breakfast.  . omeprazole (PRILOSEC) 40 MG capsule Take 40 mg by mouth daily.     Allergies:   Patient has no known allergies.   Social History   Socioeconomic History  . Marital status: Married    Spouse name: Not on file  .  Number of children: Not on file  . Years of education: Not on file  . Highest education level: Not on file  Occupational History  . Not on file  Tobacco Use  . Smoking status: Never Smoker  . Smokeless tobacco: Never Used  Substance and Sexual Activity  . Alcohol use: No  . Drug use: No  . Sexual activity: Not Currently    Birth control/protection: None    Comment: Married  Other Topics Concern  . Not on file  Social History Narrative   Lives with son [name])    Social Determinants of Health   Financial Resource Strain:   . Difficulty of Paying Living  Expenses:   Food Insecurity:   . Worried About Charity fundraiser in the Last Year:   . Arboriculturist in the Last Year:   Transportation Needs: No Transportation Needs  . Lack of Transportation (Medical): No  . Lack of Transportation (Non-Medical): No  Physical Activity:   . Days of Exercise per Week:   . Minutes of Exercise per Session:   Stress:   . Feeling of Stress :   Social Connections:   . Frequency of Communication with Friends and Family:   . Frequency of Social Gatherings with Friends and Family:   . Attends Religious Services:   . Active Member of Clubs or Organizations:   . Attends Archivist Meetings:   [name] Marital Status:      Family History: The patient's Family history is unknown by patient.  ROS:   Please see the history of present illness.     All other systems reviewed and are negative.  EKGs/Labs/Other Studies Reviewed:    The following studies were reviewed today:   EKG:  EKG is ordered today.  The ekg ordered today demonstrates sinus rhythm, rate 63, no ST/T abnormalities   TTE 07/20/19: 1. Left ventricular ejection fraction, by estimation, is 60 to 65%. The  left ventricle has normal function. The left ventricle has no regional  wall motion abnormalities. Left ventricular diastolic parameters were  normal.  2. Right ventricular systolic function is normal. The right ventricular  size is normal. Tricuspid regurgitation signal is inadequate for assessing  PA pressure.  3. The mitral valve is normal in structure and function. Mild mitral  valve regurgitation. No evidence of mitral stenosis.  4. The aortic valve is normal in structure and function. Aortic valve  regurgitation is not visualized. No aortic stenosis is present.  5. The inferior vena cava is normal in size with greater than 50%  respiratory variability, suggesting right atrial pressure of 3 mmHg.   Recent Labs: 07/29/2019: Magnesium 2.1 09/04/2019: TSH 8.960 10/30/2019:  ALT 23 11/01/2019: BUN 14; Creatinine, Ser 1.02; Hemoglobin 13.8; Platelets 302; Potassium 4.3; Sodium 139  Recent Lipid Panel    Component Value Date/Time   CHOL 113 07/20/2019 0356   CHOL 137 02/26/2017 1035   TRIG 57 07/20/2019 0356   HDL 36 (L) 07/20/2019 0356   HDL 35 (L) 02/26/2017 1035   CHOLHDL 3.1 07/20/2019 0356   VLDL 11 07/20/2019 0356   LDLCALC 66 07/20/2019 0356   LDLCALC 49 02/26/2017 1035   LDLDIRECT 78 02/20/2016 1221    Physical Exam:    VS:  BP 124/78   Pulse 63   Ht 4\' 11"  (1.499 m)   Wt 148 lb 3.2 oz (67.2 kg)   BMI 29.93 kg/m     Wt Readings from Last 3 Encounters:  11/15/19  148 lb 3.2 oz (67.2 kg)  11/15/19 146 lb 12.8 oz (66.6 kg)  10/30/19 150 lb (68 kg)     GQQ:PYPP nourished, well developed in no acute distress HEENT: Normal NECK: No JVD CARDIAC: Bradycardic, irregular no murmurs, rubs, gallops RESPIRATORY:  Clear to auscultation without rales, wheezing or rhonchi  ABDOMEN: Soft, non-tender, non-distended MUSCULOSKELETAL:  No edema; No deformity  SKIN: Warm and dry NEUROLOGIC:  Alert and oriented x 3 PSYCHIATRIC:  Normal affect   ASSESSMENT:    1. Atrial fibrillation, unspecified type ([place])   2. Sinus pause    PLAN:     Tachybrady syndrome: has Paroxysmal atrial fibrillation, but also having sinus pauses up to 3.3 seconds on recent monitor.  Referred to EP, planning for PPM - EP follow-up  Atrial fibrillation/atrial flutter: Intermittent episodes of AF/AFL with RVR following acute CVA that was complicated by [facility].  Had postconversion pauses up to 8 seconds during CVA admission.  Currently on Eliquis 5 mg twice daily and amiodarone 200 mg twice daily.  TTE shows normal LVEF.  No atrial fibrillation on Zio patch x14 days on 09/15/2019. -Continue Eliquis 5 mg twice daily -Continue amiodarone 200 mg daily  RTC in 6 months  Medication Adjustments/Labs and Tests Ordered: Current medicines are reviewed at length with the patient today.   Concerns regarding medicines are outlined above.  Orders Placed This Encounter  Procedures  . EKG 12-Lead   No orders of the defined types were placed in this encounter.   Patient Instructions  Medication Instructions:  Your physician recommends that you continue on your current medications as directed. Please refer to the Current Medication list given to you today.  *If you need a refill on your cardiac medications before your next appointment, please call your pharmacy*  Follow-Up: At [facility], you and your health needs are our priority.  As part of our continuing mission to provide you with exceptional heart care, we have created designated Provider Care Teams.  These Care Teams include your primary Cardiologist (physician) and Advanced Practice Providers (APPs -  Physician Assistants and Nurse Practitioners) who all work together to provide you with the care you need, when you need it.  We recommend signing up for the patient portal called "MyChart".  Sign up information is provided on this After Visit Summary.  MyChart is used to connect with patients for Virtual Visits (Telemedicine).  Patients are able to view lab/test results, encounter notes, upcoming appointments, etc.  Non-urgent messages can be sent to your provider as well.   To learn more about what you can do with MyChart, go to [email or web address].    Your next appointment:   Monday 12/6 at 2:20 pm   The format for your next appointment:   In Person  Provider:   Oswaldo Milian, MD      Signed, Donato Heinz, MD  11/15/2019 1:50 PM    [place]

## 2019-11-16 ENCOUNTER — Ambulatory Visit: Payer: Self-pay | Admitting: *Deleted

## 2019-11-17 ENCOUNTER — Ambulatory Visit: Payer: Self-pay | Admitting: *Deleted

## 2019-11-17 ENCOUNTER — Emergency Department (HOSPITAL_COMMUNITY): Payer: Medicare Other

## 2019-11-17 ENCOUNTER — Inpatient Hospital Stay (HOSPITAL_COMMUNITY)
Admission: RE | Admit: 2019-11-17 | Discharge: 2019-11-20 | DRG: 154 | Disposition: A | Discharge status: Home / Self Care | Payer: Medicare Other | Attending: Family Medicine | Admitting: Family Medicine

## 2019-11-17 DIAGNOSIS — K449 Diaphragmatic hernia without obstruction or gangrene: Secondary | ICD-10-CM | POA: Diagnosis present

## 2019-11-17 DIAGNOSIS — Z833 Family history of diabetes mellitus: Secondary | ICD-10-CM

## 2019-11-17 DIAGNOSIS — E038 Other specified hypothyroidism: Secondary | ICD-10-CM | POA: Diagnosis present

## 2019-11-17 DIAGNOSIS — Z7982 Long term (current) use of aspirin: Secondary | ICD-10-CM | POA: Diagnosis not present

## 2019-11-17 DIAGNOSIS — Z79890 Hormone replacement therapy (postmenopausal): Secondary | ICD-10-CM

## 2019-11-17 DIAGNOSIS — R061 Stridor: Secondary | ICD-10-CM | POA: Diagnosis present

## 2019-11-17 DIAGNOSIS — G8191 Hemiplegia, unspecified affecting right dominant side: Secondary | ICD-10-CM | POA: Diagnosis present

## 2019-11-17 DIAGNOSIS — J384 Edema of larynx: Secondary | ICD-10-CM | POA: Diagnosis not present

## 2019-11-17 DIAGNOSIS — Z7901 Long term (current) use of anticoagulants: Secondary | ICD-10-CM | POA: Diagnosis not present

## 2019-11-17 DIAGNOSIS — E039 Hypothyroidism, unspecified: Secondary | ICD-10-CM | POA: Diagnosis present

## 2019-11-17 DIAGNOSIS — E785 Hyperlipidemia, unspecified: Secondary | ICD-10-CM | POA: Diagnosis present

## 2019-11-17 DIAGNOSIS — I69391 Dysphagia following cerebral infarction: Secondary | ICD-10-CM

## 2019-11-17 DIAGNOSIS — R791 Abnormal coagulation profile: Secondary | ICD-10-CM | POA: Diagnosis present

## 2019-11-17 DIAGNOSIS — D45 Polycythemia vera: Secondary | ICD-10-CM | POA: Diagnosis present

## 2019-11-17 DIAGNOSIS — I4892 Unspecified atrial flutter: Secondary | ICD-10-CM | POA: Diagnosis present

## 2019-11-17 DIAGNOSIS — R7303 Prediabetes: Secondary | ICD-10-CM | POA: Diagnosis present

## 2019-11-17 DIAGNOSIS — J9601 Acute respiratory failure with hypoxia: Secondary | ICD-10-CM | POA: Diagnosis not present

## 2019-11-17 DIAGNOSIS — I480 Paroxysmal atrial fibrillation: Secondary | ICD-10-CM | POA: Diagnosis present

## 2019-11-17 DIAGNOSIS — K219 Gastro-esophageal reflux disease without esophagitis: Secondary | ICD-10-CM | POA: Diagnosis present

## 2019-11-17 DIAGNOSIS — R0602 Shortness of breath: Secondary | ICD-10-CM | POA: Diagnosis present

## 2019-11-17 DIAGNOSIS — I495 Sick sinus syndrome: Secondary | ICD-10-CM | POA: Diagnosis not present

## 2019-11-17 DIAGNOSIS — K224 Dyskinesia of esophagus: Secondary | ICD-10-CM | POA: Diagnosis present

## 2019-11-17 DIAGNOSIS — Z79899 Other long term (current) drug therapy: Secondary | ICD-10-CM

## 2019-11-17 DIAGNOSIS — R1310 Dysphagia, unspecified: Secondary | ICD-10-CM | POA: Diagnosis present

## 2019-11-17 DIAGNOSIS — J9600 Acute respiratory failure, unspecified whether with hypoxia or hypercapnia: Secondary | ICD-10-CM | POA: Diagnosis present

## 2019-11-17 DIAGNOSIS — J386 Stenosis of larynx: Secondary | ICD-10-CM | POA: Diagnosis present

## 2019-11-17 DIAGNOSIS — Z20822 Contact with and (suspected) exposure to covid-19: Secondary | ICD-10-CM | POA: Diagnosis present

## 2019-11-17 DIAGNOSIS — H7010 Chronic mastoiditis, unspecified ear: Secondary | ICD-10-CM | POA: Diagnosis present

## 2019-11-17 DIAGNOSIS — I10 Essential (primary) hypertension: Secondary | ICD-10-CM | POA: Diagnosis present

## 2019-11-17 LAB — CBC
HCT: 46.9 % — ABNORMAL HIGH (ref 36.0–46.0)
Hemoglobin: 15.1 g/dL — ABNORMAL HIGH (ref 12.0–15.0)
MCH: 28.8 pg (ref 26.0–34.0)
MCHC: 32.2 g/dL (ref 30.0–36.0)
MCV: 89.3 fL (ref 80.0–100.0)
Platelets: 255 10*3/uL (ref 150–400)
RBC: 5.25 MIL/uL — ABNORMAL HIGH (ref 3.87–5.11)
RDW: 15.8 % — ABNORMAL HIGH (ref 11.5–15.5)
WBC: 9.2 10*3/uL (ref 4.0–10.5)
nRBC: 0 % (ref 0.0–0.2)

## 2019-11-17 LAB — BASIC METABOLIC PANEL
Anion gap: 12 (ref 5–15)
BUN: 9 mg/dL (ref 8–23)
CO2: 20 mmol/L — ABNORMAL LOW (ref 22–32)
Calcium: 8.8 mg/dL — ABNORMAL LOW (ref 8.9–10.3)
Chloride: 106 mmol/L (ref 98–111)
Creatinine, Ser: 0.97 mg/dL (ref 0.44–1.00)
GFR calc Af Amer: >60 mL/min (ref >60)
GFR calc non Af Amer: 59 mL/min — ABNORMAL LOW (ref >60)
Glucose, Bld: 147 mg/dL — ABNORMAL HIGH (ref 70–99)
Potassium: 4.1 mmol/L (ref 3.5–5.1)
Sodium: 138 mmol/L (ref 135–145)

## 2019-11-17 LAB — SARS CORONAVIRUS 2 BY RT PCR (HOSPITAL ORDER, PERFORMED IN ~~LOC~~ HOSPITAL LAB): SARS Coronavirus 2: NEGATIVE

## 2019-11-17 LAB — TROPONIN I (HIGH SENSITIVITY)
Troponin I (High Sensitivity): 8 ng/L (ref <18)
Troponin I (High Sensitivity): 8 ng/L (ref <18)

## 2019-11-17 IMAGING — DX DG CHEST 1V PORT
1 series · 1 of 1 positions shown · non-contrast
Comparison: Portable exam [QY] hours compared to [DATE]

CLINICAL DATA: Shortness of breath

EXAM:
PORTABLE CHEST 1 VIEW

[chest]
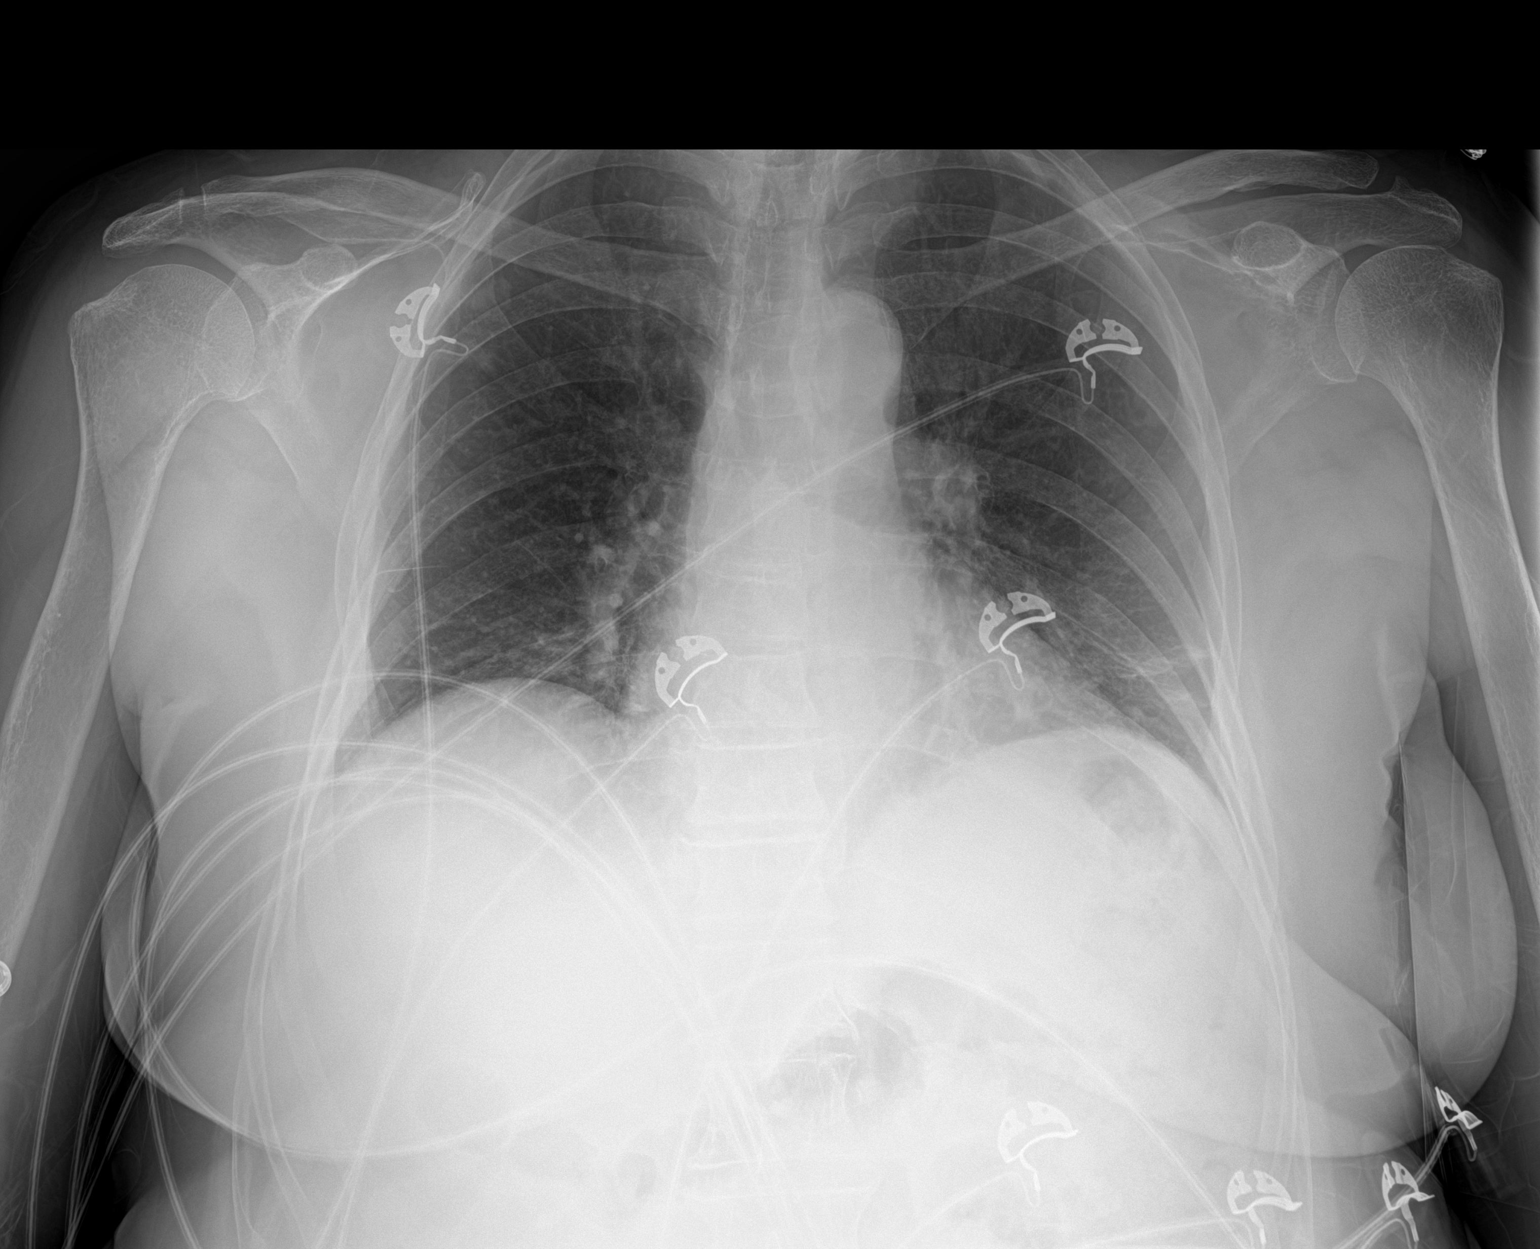

[1 of 1 positions shown; findings below may reference images not displayed]

FINDINGS: Normal heart size, mediastinal contours, and pulmonary vascularity.

Bibasilar subsegmental atelectasis.

Central peribronchial thickening.

No infiltrate, pleural effusion or pneumothorax.

Bones demineralized.
IMPRESSION: Bronchitic changes with bibasilar atelectasis.

## 2019-11-17 IMAGING — CT CT NECK W/ CM
4 of 5 series · 15 of 33 positions shown, 17 images · IV contrast (APPLIED)
Comparison: [DATE], [DATE]

CLINICAL DATA: Sore throat

EXAM:
CT NECK WITH CONTRAST
TECHNIQUE: Multidetector CT imaging of the neck was performed using the
standard protocol following the bolus administration of intravenous
contrast.
CONTRAST:  75mL OMNIPAQUE IOHEXOL 300 MG/ML  SOLN

[Series 3: neck 2.0 i31s 3 · axial · 0.48mm/px · z∈[-250,-106]mm · 4 of 121 slices shown, 5 images]
[im 25/121  soft-tissue]
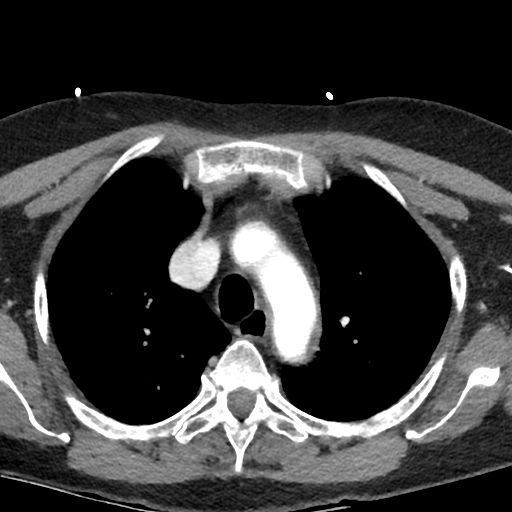
[im 25/121  bone]
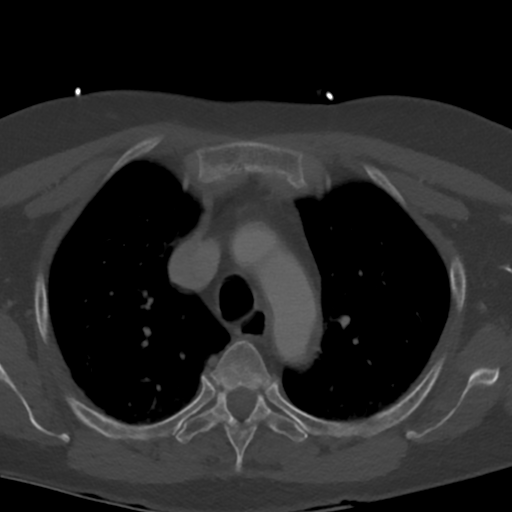
[im 49/121  bone]
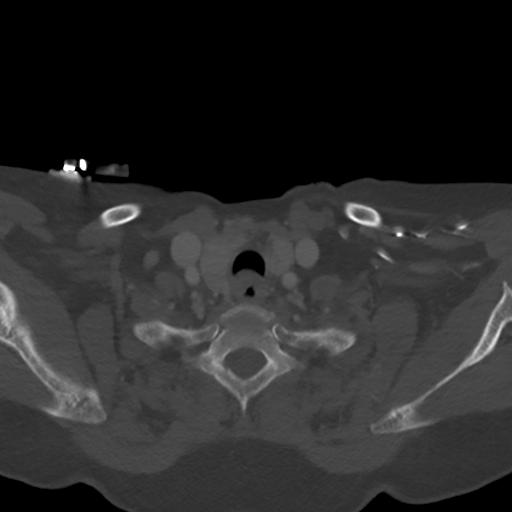
[im 73/121  bone]
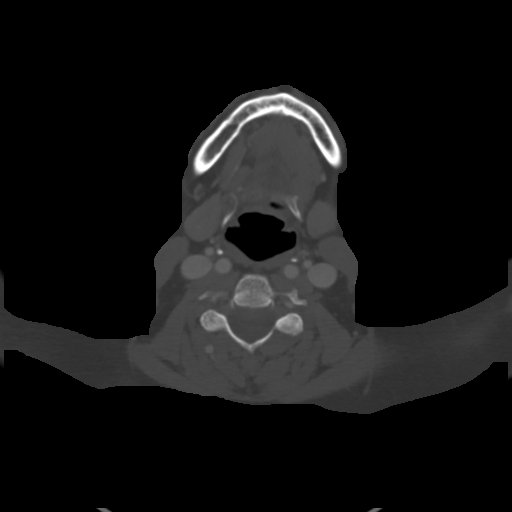
[im 97/121  bone]
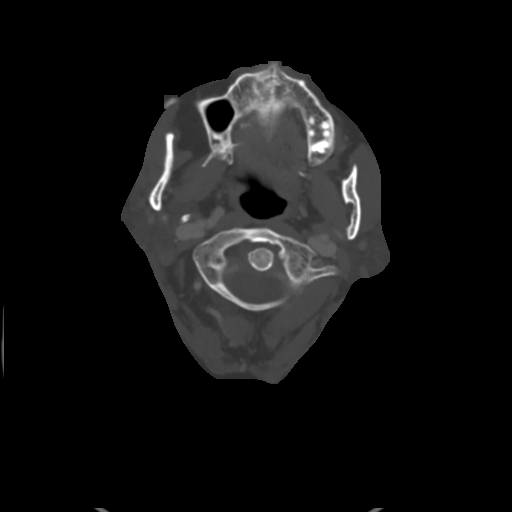

[Series 7: coronal st · coronal · 0.37mm/px · 3 of 97 slices shown]
[im 20/97  bone]
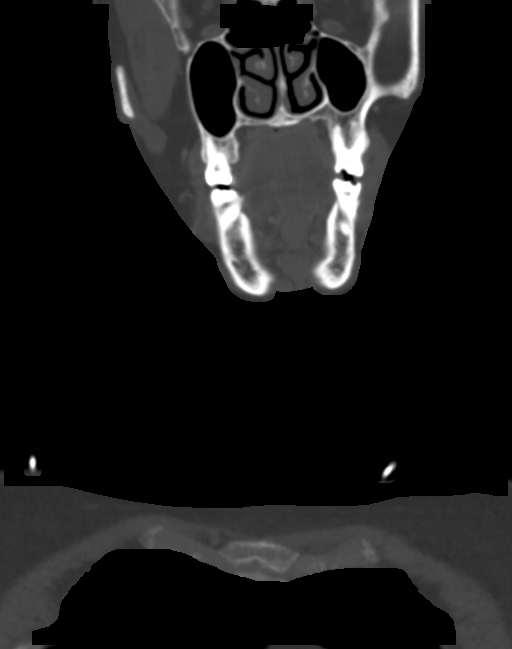
[im 39/97  bone]
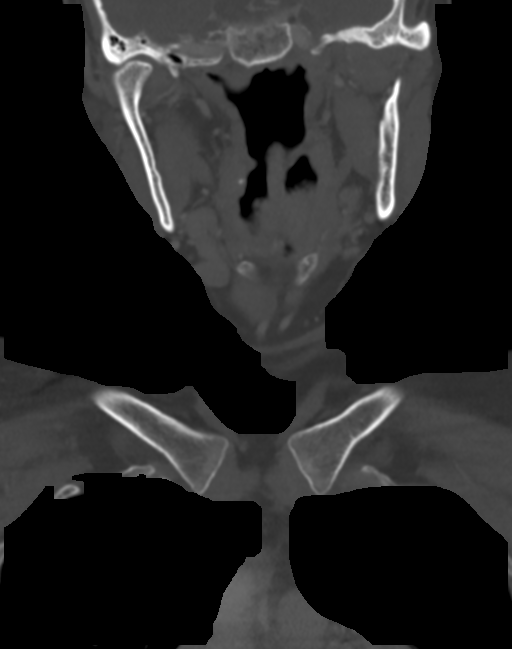
[im 58/97  bone]
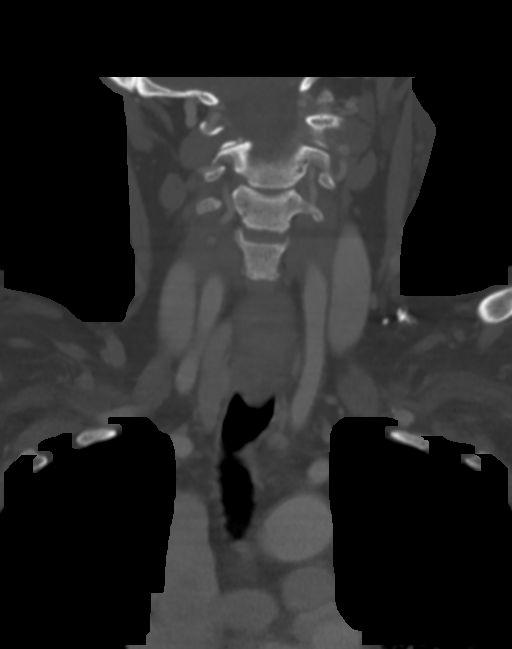

[Series 8: sagittal st · sagittal · 0.45mm/px · 5 of 101 slices shown, 6 images]
[im 34/101  bone]
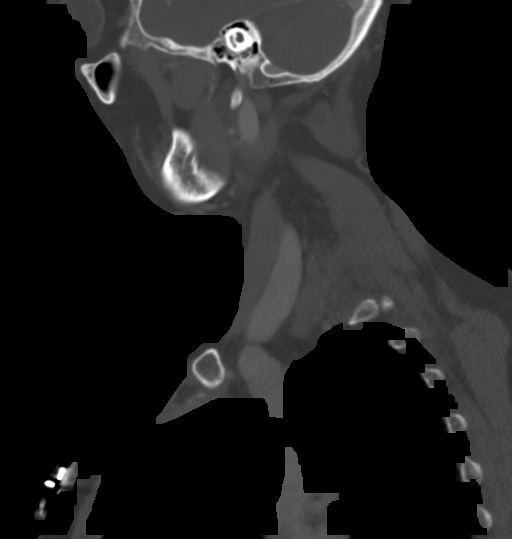
[im 42/101  bone]
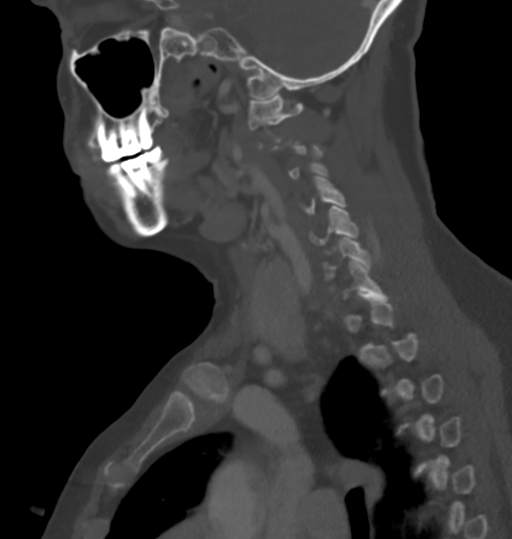
[im 51/101  soft-tissue]
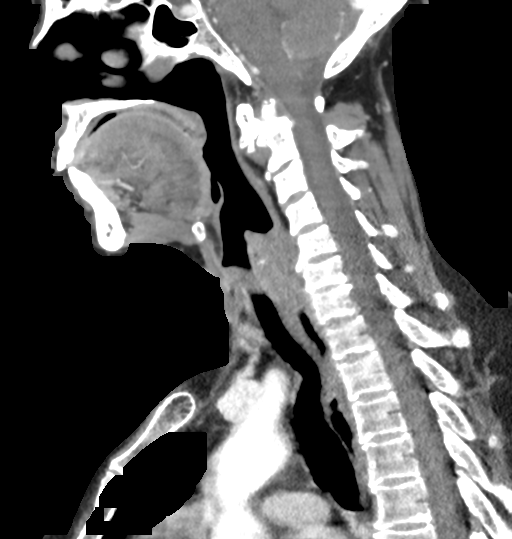
[im 51/101  bone]
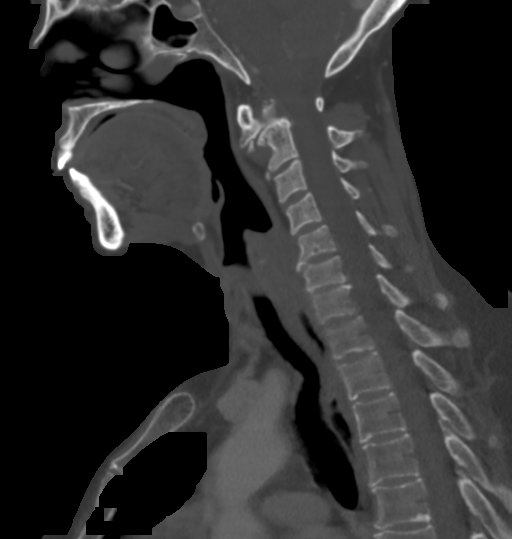
[im 59/101  bone]
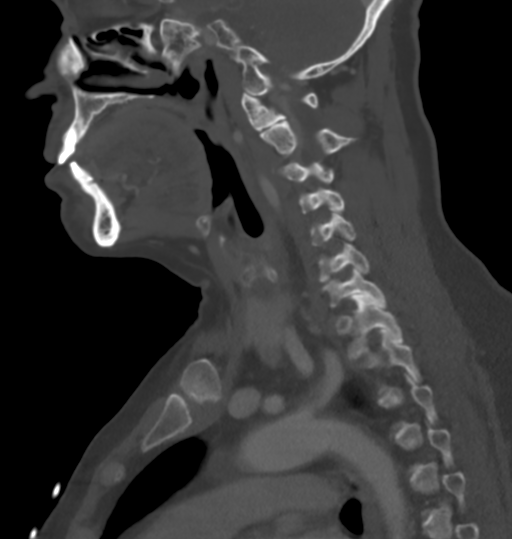
[im 67/101  bone]
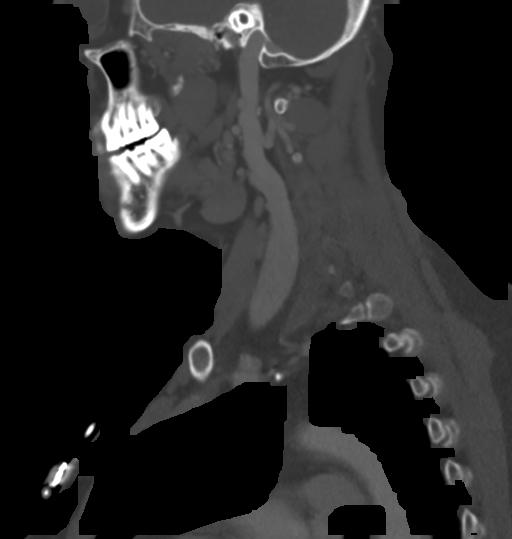

[Series 9: orthogonal st · axial · 0.39mm/px · z∈[-239,-119]mm · 3 of 120 slices shown]
[im 30/120  bone]
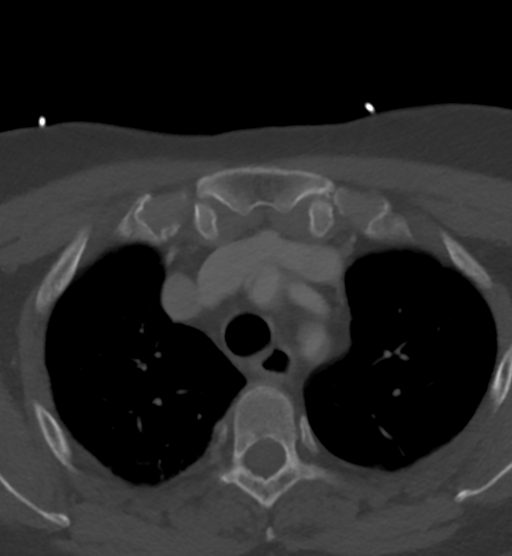
[im 60/120  bone]
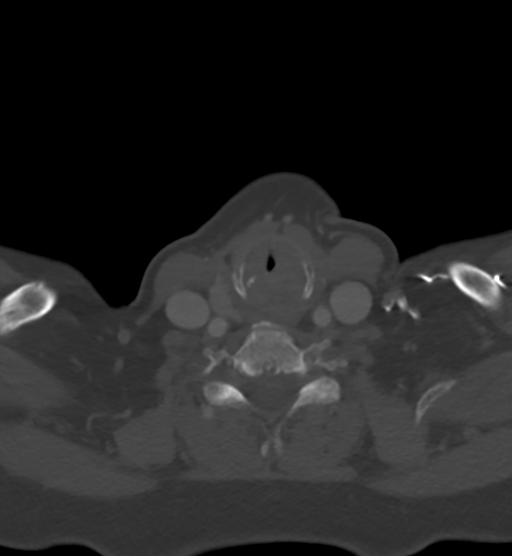
[im 90/120  bone]
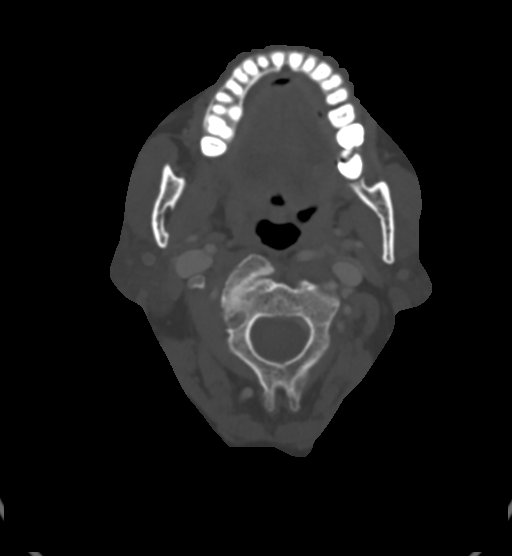

[15 of 33 positions shown; findings below may reference images not displayed]

FINDINGS: Pharynx and larynx: There is persistent soft tissue thickening at
the level of the glottis, increased since [DATE] with an
appearance similar to [DATE]. A small focal area of low
attenuation is present posteriorly on the left also present on prior
imaging (series 3, image 66).

Salivary glands: Parotid and submandibular glands are unremarkable.

Thyroid: Normal.

Lymph nodes: No enlarged or abnormal density lymph nodes.

Vascular: Major neck vessels are patent. Retropharyngeal course of
the internal carotid arteries.

Limited intracranial: No abnormal enhancement.

Visualized orbits: Unremarkable.

Mastoids and visualized paranasal sinuses: Minor paranasal sinus
mucosal thickening. Chronic left middle ear and mastoid
opacification. Mastoid bony sclerosis likely reflects sequelae of
chronic inflammation.

Skeleton: No acute abnormality.

Upper chest: No new abnormality

Other: None.
IMPRESSION: Soft tissue fullness at the level of the glottis is increased since
[DATE] with appearance similar to [DATE]. There is an
indeterminate small area of low density posteriorly on the left and
a submucosal lesion is not excluded.

## 2019-11-17 MED ORDER — RACEPINEPHRINE HCL 2.25 % IN NEBU
0.5000 mL | INHALATION_SOLUTION | RESPIRATORY_TRACT | Status: DC | PRN
  Filled 2019-11-17: qty 0.5

## 2019-11-17 MED ORDER — SODIUM CHLORIDE 0.9% FLUSH
3.0000 mL | Freq: Once | INTRAVENOUS | Status: AC
  Administered 2019-11-17: 3 mL via INTRAVENOUS

## 2019-11-17 MED ORDER — IOHEXOL 300 MG/ML  SOLN
75.0000 mL | Freq: Once | INTRAMUSCULAR | Status: AC | PRN
  Administered 2019-11-17: 75 mL via INTRAVENOUS

## 2019-11-17 MED ORDER — LEVOTHYROXINE SODIUM 100 MCG/5ML IV SOLN
44.0000 ug | Freq: Every day | INTRAVENOUS | Status: DC
  Administered 2019-11-18 – 2019-11-20 (×3): 44 ug via INTRAVENOUS
  Filled 2019-11-17 (×3): qty 5

## 2019-11-17 MED ORDER — SODIUM CHLORIDE 0.9 % IV SOLN: INTRAVENOUS | Status: DC

## 2019-11-17 MED ORDER — METHYLPREDNISOLONE SODIUM SUCC 125 MG IJ SOLR
125.0000 mg | Freq: Once | INTRAMUSCULAR | Status: AC
  Administered 2019-11-17: 125 mg via INTRAVENOUS
  Filled 2019-11-17: qty 2

## 2019-11-17 MED ORDER — PANTOPRAZOLE SODIUM 40 MG IV SOLR
40.0000 mg | Freq: Every day | INTRAVENOUS | Status: DC
  Administered 2019-11-18 – 2019-11-20 (×3): 40 mg via INTRAVENOUS
  Filled 2019-11-17 (×4): qty 40

## 2019-11-17 MED ORDER — RACEPINEPHRINE HCL 2.25 % IN NEBU
0.5000 mL | INHALATION_SOLUTION | Freq: Once | RESPIRATORY_TRACT | Status: AC
  Administered 2019-11-17: 0.5 mL via RESPIRATORY_TRACT
  Filled 2019-11-17: qty 0.5

## 2019-11-17 NOTE — H&P (Addendum)
[facility] Admission History and Physical Service Pager: [phone]  Patient name: Jamie Mitchell Medical record number: [id number] Date of birth: 18-Dec-1945 Age: 74 y.o. Gender: female  Primary Care Provider: Charlott Rakes, MD Consultants: None Code Status: Full Preferred Emergency Contact: Jeannine Boga, [phone]  Chief Complaint: shortness of breath  Spanish Jamie Mitchell [id number] used for the entirety of the encounter.     Assessment and Plan: Jamie Mitchell is a 74 y.o. female presenting with shortness of breath. 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  Glottic edema with stridor Patient with recurrence of stridor and dyspnea, admitted for the same 6/7-6/9 and discharged with augmentin and steroid taper.  Last seen by Dr. Lucia Gaskins, ENT on 6/21 and was doing well at that time, but restarted of amoxicillin due to complaints of sore throat.  Reports throat pain woke her up from sleep yesterday.  Began to have coughing and wheezing therefore she presented to the ED.  Chest x-ray significant for bronchiolitic changes with bibasilar atelectasis.  ED curb sided Dr. Lucia Gaskins who recommended steroids, antibiotics and repeat CT soft tissue neck.  CT soft tissue neck significant for soft tissue fullness at the level of the glottis with similar appearance to the 10/30/2019 admission however was increased since her last CT on 11/10/2019.  Of note, there questionable submucosal lesion in a small area of of low density posteriorly to the left of the glottis.  Patient given Solu-Medrol and racemic epinephrine nebulizer treatment in the ED.  Patient discharged earlier this month with steroid taper and antibiotics.  Patient followed up with Dr. Lucia Gaskins earlier this week and he restarted her antibiotics.  Laryngoscope performed outpatient was notable for redundant tissue near the right false  vocal cord.  Etiology of patient's glottal edema is unknown at this time.  Acute upper respiratory infection possible but unlikely given recurrence of patient's symptoms.  Patient could be immunocompromised with opportunistic infection.  Will check HIV.  Diphtheria considered however less likely as there were no pseudomembranes seen in the oropharynx.  Additionally, patient without leukocytosis.  Differential also includes syphilis, fungal, TB and granulomatosis with polyangiitis.  Will obtain RPR, but would hold off on treatment for fungal cause or obtaining quant gold as this would be much less likely a cause.  For now, will broaden to cover for pseudomonas as well given lack of improvement after augmentin and amoxicillin, and continue to monitor her respiratory status closely. Her vitals have been stable in ED, WBC 9.2, and she is afebrile.  She has been coughing up sputum, therefore will obtain culture. Patient was intubated 3 months ago during her CVA admission. Unsure if recurrent edema is related to structural damage caused by intubation.  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 -Admit to medical telemetry, attending Dr. McDiarmid -ENT, Dr. Lucia Gaskins curbsided in the ED -Consult Dr. Benjamine Mola, ENT, if patient not improving -Cefepime (6/26 - ) -Solu-Medrol 125 mg daily (6/25 - )  -Obtain respiratory culture/sputum culture -Racemic epi nebulizer PRN  -NPO  -Monitor for respiratory status changes -Consult speech for swallow study -Maintenance IV fluids until no longer NPO -Vitals per unit routine -Continuous cardiopulmonary monitoring -  Protonix 40 mg daily -PT/OT evaluate and treat -AM CBC/CMP -Follow-up RPR,  HIV  Left MCA stroke s/p TPA with thrombectomy Had stroke  3-4 months ago and has had dysphagia ever since.  No other focal neurologic deficits noted.  Patient currently on Eliquis 5 mg daily and aspirin 81 mg daily. -Hold home meds while NPO -Heparin drip  Gastroesophageal reflux  Hiatal hernia Esophagram swallow study performed last admission showed gastroesophageal reflux with borderline appearance for nonspecific esophageal dysmotility, given the presence of proximal escape and initial secondary and tertiary contractions.  There was a small type I hiatal hernia and mild organoaxial rotation of the stomach in the upper abdomen.  Home medications include omeprazole. -IV Protonix while NPO  Atrial fibrillation Initially in A. fib with RVR but had resolved prior to ED physician evaluation.  Home medications include Eliquis 5 mg twice daily, aspirin 81 mg daily, amiodarone 200 mg daily. -Hold home Eliquis, aspirin, amiodarone -Heparin drip per pharmacy with A fib dosing given NPO for glottic swelling -Continuous cardiac monitoring  Hypothyroidism Home medications include Synthroid 88 mcg daily -Converted home dose to IV, Levothyroxine 44 mcg  -Resume oral dose when able  Hypertension  Diet controlled. Blood pressure since arrival have ranged from 110/72-129/95.  -Monitor blood pressures     FEN/GI: NPO except sips with meds Prophylaxis: Heparin drip   Disposition: Admit to medical telemetry      History of Present Illness:  Jamie Mitchell is a 74 y.o. female presenting with shortness of breath with associated throat pain and cough.  Patient states that she has pain in her throat and a cough.  She states that this had originally gotten better, but got worse again yesterday.  She was seen by Dr. Lucia Gaskins on 6/21 and was doing well.  When the pain came back, she states that she was sleeping and resting. She states that it woke her from sleep.  She states that the pain  is scratchy.  She states that her throat is tight and it feels like she cannot breathe well.  Her cough has phlegm in it.  She denies known sick contacts.  She has had no symptoms like this in the past aside from her last hospitalization.  Reports occasional headaches.  She vomited once this AM.  Denies any nausea at present, denies blood in emesis.  She denies back pain, eye pain, neck pain, ear pain.  She has pain with swallowing.  She has not been able to eat.  She was discharged following a similar presentation after hospitalization from  6/7-11/01/19.  She was discharged on steroid taper and Augmentin for 6 days.  She was seen on 6/21 by ENT and was given another round Amoxicillin and started on omeprazole.  She had laryngoscopy at that time as well, was found to have a small amount of redundant tissue in region of right false cord, vocal cords were clear, glottis and subglottis were clear.  In ED, Dr. Lucia Gaskins was consulted and recommended repeat CT scan of neck, IV steroids, antibiotics, and possible ID consult.  Dr. Benjamine Mola was advised of patient's status and admission as he is covering for Dr. Lucia Gaskins while he is out of town.    Review Of Systems: Per HPI with the following additions:   Review of Systems  Constitutional: Negative for fever.  HENT: Positive for sore throat. Negative for ear pain.   Eyes: Negative for pain.  Respiratory: Positive for cough, sputum production, shortness of breath and wheezing.  Negative for hemoptysis.   Cardiovascular: Negative for chest pain.  Gastrointestinal: Positive for nausea and vomiting. Negative for abdominal pain.  Genitourinary: Negative for dysuria.  Musculoskeletal: Negative for neck pain.  Neurological: Positive for headaches.  All other systems reviewed and are negative.   Patient Active Problem List   Diagnosis Date Noted  . Stridor 10/31/2019  . SOB (shortness of breath) 10/30/2019  . Shortness of breath 10/30/2019  . Subglottic edema  10/30/2019  . Renal failure 07/25/2019  . Prediabetes 07/25/2019  . Left middle cerebral artery stroke ([place]) 07/25/2019  . 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 Dysphagia 07/25/2019  . 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 Acute respiratory failure ([place])   . Tachycardia-bradycardia syndrome ([place])   . Atrial fibrillation with RVR ([place])   . Hypertension 07/23/2015  . Hyperlipidemia 07/23/2015  . 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 Hypothyroidism 01/25/2014    Past Medical History: Past Medical History:  Diagnosis Date  . Atrial fibrillation ([place])   . Hypertension   . Stroke ([place])   . Thyroid disease     Past Surgical History: Past Surgical History:  Procedure Laterality Date  . IR CT HEAD LTD  07/20/2019  . IR PERCUTANEOUS ART THROMBECTOMY/INFUSION INTRACRANIAL INC DIAG ANGIO  07/20/2019  . RADIOLOGY WITH ANESTHESIA N/A 07/20/2019   Procedure: IR WITH ANESTHESIA;  Surgeon: Luanne Bras, MD;  Location: [facility];  Service: Radiology;  Laterality: N/A;    Social History: Social History   Tobacco Use  . Smoking status: Never Smoker  . Smokeless tobacco: Never Used  Substance Use Topics  . Alcohol use: No  . Drug use: No   Additional social history: None  Please also refer to relevant sections of EMR.  Family History: Family History  Family history unknown: Yes   Specifically no family history of throat pain or throat cancer Two Sisters have heart disease Sister has diabetes  Allergies and Medications: No Known Allergies No current facility-administered medications on file prior to encounter.   Current Outpatient Medications on File Prior to Encounter  Medication Sig Dispense Refill  . albuterol (VENTOLIN HFA) 108 (90 Base) MCG/ACT inhaler Inhale 1-2 puffs into the lungs every 6 (six) hours as needed for wheezing or shortness of  breath. 18 g 0  . amiodarone (PACERONE) 200 MG tablet Take 1 tablet (200 mg total) by mouth daily. 30 tablet 5  . apixaban (ELIQUIS) 5 MG TABS tablet Take 1 tablet (5 mg total) by mouth 2 (two) times daily. 90 tablet 3  . atorvastatin (LIPITOR) 40 MG tablet Take 1 tablet (40 mg total) by mouth daily. (Patient taking differently: 40 mg every evening. ) 30 tablet 0  . cetirizine (ZYRTEC ALLERGY) 10 MG tablet Take 1 tablet (10 mg total) by mouth daily. 30 tablet 0  . diclofenac Sodium (VOLTAREN) 1 % GEL Apply 2 g topically 4 (four) times daily. 100 g 3  . levothyroxine (SYNTHROID) 88 MCG tablet Take 1 tablet (88 mcg total) by mouth daily before breakfast. 30 tablet 3  . omeprazole (PRILOSEC) 40 MG capsule Take 40 mg by mouth daily.    . [DISCONTINUED] famotidine (PEPCID) 20 MG tablet Take 1 tablet (20 mg total) by mouth 2 (two) times daily. (Patient not taking: Reported on 10/24/2019) 60 tablet 0    Objective: BP (!) 127/96   Pulse (!) 43   Temp 98.5 F (  36.9 C) (Oral)   Resp 13   Ht 4\' 11"  (1.499 m)   Wt 67.2 kg   SpO2 99%   BMI 29.93 kg/m  Exam: GEN:     alert, cooperative mild respiratory distress   HENT:  mucus membranes moist, oropharyngeal without lesions or erythema,  nares patent, no nasal discharge EYES:   pupils equal and reactive, EOM intact NECK:  supple, normal ROM, no lymphadenopathy appreciated  RESP:   Stridor inspiratory and expiratory, tugging present, no retractions, stable on room air, no wheezing, no rales, speaking in full sentences though with a hoarse voice  CVS:    Bradycardic, regular rhythm, no murmur, distal pulses intact ABD:  soft, non-tender; bowel sounds present; no palpable masses EXT:   normal ROM, atraumatic, no edema NEURO:  normal without focal findings,  speech normal, alert and oriented  Skin:   warm and dry, no rash, normal skin turgor, brisk cap refill  Psych: Normal affect and thought content   Labs and Imaging: CBC BMET  Recent Labs  Lab  11/17/19 1725  WBC 9.2  HGB 15.1*  HCT 46.9*  PLT 255   Recent Labs  Lab 11/17/19 1725  NA 138  K 4.1  CL 106  CO2 20*  BUN 9  CREATININE 0.97  GLUCOSE 147*  CALCIUM 8.8*     EKG: HR 61, NSR   CT Soft Tissue Neck W Contrast  Result Date: 11/17/2019 CLINICAL DATA:  Sore throat EXAM: CT NECK WITH CONTRAST TECHNIQUE: Multidetector CT imaging of the neck was performed using the standard protocol following the bolus administration of intravenous contrast. CONTRAST:  34mL OMNIPAQUE IOHEXOL 300 MG/ML  SOLN COMPARISON:  11/10/2019, 10/30/2019 FINDINGS: Pharynx and larynx: There is persistent soft tissue thickening at the level of the glottis, increased since 11/10/2019 with an appearance similar to 10/30/2019. A small focal area of low attenuation is present posteriorly on the left also present on prior imaging (series 3, image 66). Salivary glands: Parotid and submandibular glands are unremarkable. Thyroid: Normal. Lymph nodes: No enlarged or abnormal density lymph nodes. Vascular: Major neck vessels are patent. Retropharyngeal course of the internal carotid arteries. Limited intracranial: No abnormal enhancement. Visualized orbits: Unremarkable. Mastoids and visualized paranasal sinuses: Minor paranasal sinus mucosal thickening. Chronic left middle ear and mastoid opacification. Mastoid bony sclerosis likely reflects sequelae of chronic inflammation. Skeleton: No acute abnormality. Upper chest: No new abnormality Other: None. IMPRESSION: Soft tissue fullness at the level of the glottis is increased since 11/10/2019 with appearance similar to 10/30/2019. There is an indeterminate small area of low density posteriorly on the left and a submucosal lesion is not excluded. Electronically Signed   By: Macy Mis M.D.   On: 11/17/2019 21:45   DG Chest Portable 1 View  Result Date: 11/17/2019 CLINICAL DATA:  Shortness of breath EXAM: PORTABLE CHEST 1 VIEW COMPARISON:  Portable exam 1844 hours  compared to 10/30/2019 FINDINGS: Normal heart size, mediastinal contours, and pulmonary vascularity. Bibasilar subsegmental atelectasis. Central peribronchial thickening. No infiltrate, pleural effusion or pneumothorax. Bones demineralized. IMPRESSION: Bronchitic changes with bibasilar atelectasis. Electronically Signed   By: Lavonia Dana M.D.   On: 11/17/2019 19:12     Lyndee Hensen, DO 11/17/2019, 11:05 PM PGY-1, [place] Intern pager: [phone], text pages welcome  FPTS Upper-Level Resident Addendum   I have independently interviewed and examined the patient. I have discussed the above with the original author and agree with their documentation. My edits for correction/addition/clarification are in  green. Please see also any attending notes.   [place] Constable, D.O. PGY-2, [facility] 11/18/2019 5:14 AM  FPTS Service pager: [phone] (text pages welcome through [facility])

## 2019-11-17 NOTE — ED Provider Notes (Signed)
[place] [facility] EMERGENCY DEPARTMENT Provider Note   CSN: [id number] Arrival date & time: 11/17/19  1633     History Chief Complaint  Patient presents with   Shortness of Breath    Jamie Mitchell is a 74 y.o. female with PMH notable for HTN, HLD, thyroid disease, CVA on 07/19/2019, atrial fibrillation/flutter with RVR on amiodarone and Eliquis 5 mg twice daily, and most notably recent subglottic edema for which she was admitted on 10/30/2019 who presents to the ED with complaints of sore throat and wheezing.  Patient is accompanied by her daughter who reports that this is the same symptoms that she was experiencing when she was previously admitted.  She states that she had improvement in the weeks following her discharge, however last evening her sore throat symptoms returned and this morning she was having difficulty breathing and was wheezing considerably.  She was recently evaluated by Dr. Lucia Gaskins, otolaryngology, who performed laryngoscopy that was largely benign.  However, he informed her that he would need to return to the office or go to the ER should her symptoms recur.  She denies any fevers or chills, chest pain, or other symptoms.  An interpreter was used during entire exam and assessment.  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, d/t AF 07/20/2019   Acute respiratory failure ([place])     Tachycardia-bradycardia syndrome ([place])    Atrial fibrillation with RVR ([place])    Hypertension 07/23/2015   Hyperlipidemia 07/23/2015   DJD (degenerative joint disease) of knee 01/25/2014   Hypothyroidism 01/25/2014    Past Surgical History:  Procedure Laterality Date   IR CT HEAD LTD  07/20/2019   IR PERCUTANEOUS ART THROMBECTOMY/INFUSION INTRACRANIAL INC DIAG ANGIO  07/20/2019   RADIOLOGY WITH ANESTHESIA N/A 07/20/2019   Procedure: IR WITH ANESTHESIA;  Surgeon: Luanne Bras, MD;  Location: [facility];  Service: Radiology;  Laterality: N/A;     OB History   No obstetric history on file.     Family History  Family history unknown: Yes    Social History   Tobacco Use   Smoking status: Never Smoker   Smokeless tobacco: Never Used  Substance Use Topics   Alcohol use: No   Drug use: No    Home Medications Prior to Admission medications   Medication Sig Start Date End Date Taking? Authorizing Provider  albuterol (VENTOLIN HFA) 108 (90 Base) MCG/ACT inhaler Inhale 1-2 puffs into the lungs every 6 (six) hours as needed for wheezing or shortness of breath. 10/09/19   Jaynee Eagles, PA-C  amiodarone (PACERONE) 200 MG tablet Take 1 tablet (200 mg total) by mouth daily. 09/14/19   Donato Heinz, MD  apixaban (ELIQUIS) 5 MG TABS tablet Take 1 tablet (5 mg total) by mouth 2 (two) times daily. 10/02/19   Evans Lance, MD  atorvastatin (LIPITOR) 40 MG tablet Take 1 tablet (40  mg total) by mouth daily. Patient taking differently: 40 mg every evening.  08/02/19   Angiulli, Lavon Paganini, PA-C  cetirizine (ZYRTEC ALLERGY) 10 MG tablet Take 1 tablet (10 mg total) by mouth daily. 10/13/19   Jaynee Eagles, PA-C  diclofenac Sodium (VOLTAREN) 1 % GEL Apply 2 g topically 4 (four) times daily. 11/15/19   Argentina Donovan, PA-C  levothyroxine (SYNTHROID) 88 MCG tablet Take 1 tablet (88 mcg total) by mouth daily before breakfast. 09/08/19   Charlott Rakes, MD  omeprazole (PRILOSEC) 40  MG capsule Take 40 mg by mouth daily.    [provider]  famotidine (PEPCID) 20 MG tablet Take 1 tablet (20 mg total) by mouth 2 (two) times daily. Patient not taking: Reported on 10/24/2019 10/13/19 10/26/19  Jaynee Eagles, PA-C    Allergies    Patient has no known allergies.  Review of Systems   Review of Systems  All other systems reviewed and are negative.   Physical Exam Updated Vital Signs BP (!) 129/95 (BP Location: Right Arm)    Pulse (!) 131    Temp 98.5 F (36.9 C) (Oral)    Resp (!) 28    Ht 4\' 11"  (1.499 m)    Wt 67.2 kg    SpO2 98%    BMI 29.93 kg/m   Physical Exam Vitals and nursing note reviewed. Exam conducted with a chaperone present.  Constitutional:      Appearance: She is ill-appearing.  HENT:     Head: Normocephalic and atraumatic.     Mouth/Throat:     Comments: Patent oropharynx.  No tongue swelling.  No significant floor of mouth induration.  No trismus.  Tolerating secretions.  Voice change. Eyes:     General: No scleral icterus.    Conjunctiva/sclera: Conjunctivae normal.  Neck:     Comments: No significant tenderness appreciated.  No meningismus. Cardiovascular:     Rate and Rhythm: Normal rate. Rhythm irregular.     Pulses: Normal pulses.     Heart sounds: Normal heart sounds.  Pulmonary:     Comments: Mildly increased work of breathing.  Stridor and wheezing.  Can speak in full sentences.  Breath sounds intact bilaterally.  Intermittent cough. Musculoskeletal:     Cervical back: Normal range of motion. No rigidity.  Skin:    General: Skin is dry.  Neurological:     Mental Status: She is alert.     GCS: GCS eye subscore is 4. GCS verbal subscore is 5. GCS motor subscore is 6.  Psychiatric:        Mood and Affect: Mood normal.        Behavior: Behavior normal.        Thought Content: Thought content normal.     ED Results / Procedures / Treatments   Labs (all labs ordered are listed, but only abnormal results are displayed) Labs  Reviewed  BASIC METABOLIC PANEL - Abnormal; Notable for the following components:      Result Value   CO2 20 (*)    Glucose, Bld 147 (*)    Calcium 8.8 (*)    GFR calc non Af Amer 59 (*)    All other components within normal limits  CBC - Abnormal; Notable for the following components:   RBC 5.25 (*)    Hemoglobin 15.1 (*)    HCT 46.9 (*)    RDW 15.8 (*)    All other components within normal limits  SARS CORONAVIRUS 2 BY RT PCR [facility]  ORDER, PERFORMED IN [facility])  TROPONIN I (HIGH SENSITIVITY)  TROPONIN I (HIGH SENSITIVITY)    EKG None  Radiology CT Soft Tissue Neck W Contrast  Result Date: 11/17/2019 CLINICAL DATA:  Sore throat EXAM: CT NECK WITH CONTRAST TECHNIQUE: Multidetector CT imaging of the neck was performed using the standard protocol following the bolus administration of intravenous contrast. CONTRAST:  30mL OMNIPAQUE IOHEXOL 300 MG/ML  SOLN COMPARISON:  11/10/2019, 10/30/2019 FINDINGS: Pharynx and larynx: There is persistent soft tissue thickening at the level of the glottis, increased since 11/10/2019 with an appearance similar to 10/30/2019. A small focal area of low attenuation is present posteriorly on the left also present on prior imaging (series 3, image 66). Salivary glands: Parotid and submandibular glands are unremarkable. Thyroid: Normal. Lymph nodes: No enlarged or abnormal density lymph nodes. Vascular: Major neck vessels are patent. Retropharyngeal course of the internal carotid arteries. Limited intracranial: No abnormal enhancement. Visualized orbits: Unremarkable. Mastoids and visualized paranasal sinuses: Minor paranasal sinus mucosal thickening. Chronic left middle ear and mastoid opacification. Mastoid bony sclerosis likely reflects sequelae of chronic inflammation. Skeleton: No acute abnormality. Upper chest: No new abnormality Other: None. IMPRESSION: Soft tissue fullness at the level of the glottis is increased since 11/10/2019 with  appearance similar to 10/30/2019. There is an indeterminate small area of low density posteriorly on the left and a submucosal lesion is not excluded. Electronically Signed   By: Macy Mis M.D.   On: 11/17/2019 21:45   DG Chest Portable 1 View  Result Date: 11/17/2019 CLINICAL DATA:  Shortness of breath EXAM: PORTABLE CHEST 1 VIEW COMPARISON:  Portable exam 1844 hours compared to 10/30/2019 FINDINGS: Normal heart size, mediastinal contours, and pulmonary vascularity. Bibasilar subsegmental atelectasis. Central peribronchial thickening. No infiltrate, pleural effusion or pneumothorax. Bones demineralized. IMPRESSION: Bronchitic changes with bibasilar atelectasis. Electronically Signed   By: Lavonia Dana M.D.   On: 11/17/2019 19:12    Procedures Procedures (including critical care time)  Medications Ordered in ED Medications  sodium chloride flush (NS) 0.9 % injection 3 mL (3 mLs Intravenous Given 11/17/19 2016)  Racepinephrine HCl 2.25 % nebulizer solution 0.5 mL (0.5 mLs Nebulization Given 11/17/19 2021)  methylPREDNISolone sodium succinate (SOLU-MEDROL) 125 mg/2 mL injection 125 mg (125 mg Intravenous Given 11/17/19 2015)  iohexol (OMNIPAQUE) 300 MG/ML solution 75 mL (75 mLs Intravenous Contrast Given 11/17/19 2122)    ED Course  I have reviewed the triage vital signs and the nursing notes.  Pertinent labs & imaging results that were available during my care of the patient were reviewed by me and considered in my medical decision making (see chart for details).  Clinical Course as of Nov 17 2222  Fri Nov 17, 2019  2017 Spoke with Dr. Lucia Gaskins who recommends that patient be treated with steroids and antibiotics and then admission to the hospitalist services she continues to experience shortness of breath symptoms.  Also recommending repeat soft tissue CT.  Dr. Benjamine Mola, ENT, is covering for Dr. Pollie Friar patients while he is out of town and will be able to round on this patient once admitted and  intervene as needed.     [GG]  2019 Spoke with Dr. Benjamine Mola who has been made aware.    [GG]  2213 Spoke with Family Medicine team who will see and admit patient.   [GG]    Clinical Course User Index [GG] Corena Herter, PA-C   MDM Rules/Calculators/A&P  While she was initially tachycardic in the 130s, she was in atrial fibrillation with HR in 60s on my examination.  She reports that she has been taking her amiodarone and Eliquis, as directed.  She had also been taking amoxicillin for her recent episode of subglottic edema, as directed.  Given her recurrence of significant sore throat symptoms, breathing difficulty, and hoarse voice, she feels confident that this is a return of her subglottic edema.  She recently had repeat CT soft tissue neck which showed evidence of improvement, however that was prior to her recurrence of symptoms that began yesterday and worsened this morning.  On reexamination, patient feels much improved after the racemic epinephrine and 125 mg Solu-Medrol.  She had been taking her amoxicillin, routinely.  However, she had not been on any steroid medications at home.  While she feels improved, she is still demonstrating a mildly increased work of breathing with abnormal breath sounds bilaterally.  She has trouble speaking long sentences without a break to catch her breath.  Her daughter at bedside is concerned that if she gets discharged she will wind up returning or getting worse.   Spoke with Dr. Lucia Gaskins who recommends that patient be treated with steroids and antibiotics and then admission to the hospitalist services she continues to experience shortness of breath symptoms.  Also recommending repeat soft tissue CT.  Dr. Benjamine Mola, ENT, is covering for Dr. Pollie Friar patients while he is out of town and will be able to round on this patient once admitted and intervene as needed.  Dr. Lucia Gaskins also believes that she might benefit from infectious disease consult  given unknown reason for her recurrent episodes of subglottic edema.  We will consult family admitting physicians as she was last discharged by Dr. Talbert Cage, family medicine, during most recent encounter.     Final Clinical Impression(s) / ED Diagnoses Final diagnoses:  Subglottic edema    Rx / DC Orders ED Discharge Orders    None       Corena Herter, PA-C 11/17/19 2225    Maudie Flakes, MD 11/18/19 0025

## 2019-11-17 NOTE — ED Notes (Signed)
Pt returned from CT °

## 2019-11-17 NOTE — ED Notes (Signed)
Pt transported to CT ?

## 2019-11-18 ENCOUNTER — Other Ambulatory Visit: Payer: Self-pay

## 2019-11-18 ENCOUNTER — Inpatient Hospital Stay (HOSPITAL_COMMUNITY): Payer: Medicare Other

## 2019-11-18 ENCOUNTER — Encounter (HOSPITAL_COMMUNITY): Payer: Self-pay | Admitting: Family Medicine

## 2019-11-18 DIAGNOSIS — J9601 Acute respiratory failure with hypoxia: Secondary | ICD-10-CM

## 2019-11-18 DIAGNOSIS — E038 Other specified hypothyroidism: Secondary | ICD-10-CM

## 2019-11-18 DIAGNOSIS — I495 Sick sinus syndrome: Secondary | ICD-10-CM

## 2019-11-18 DIAGNOSIS — R061 Stridor: Secondary | ICD-10-CM

## 2019-11-18 DIAGNOSIS — K224 Dyskinesia of esophagus: Secondary | ICD-10-CM | POA: Diagnosis present

## 2019-11-18 DIAGNOSIS — K219 Gastro-esophageal reflux disease without esophagitis: Secondary | ICD-10-CM

## 2019-11-18 DIAGNOSIS — R0602 Shortness of breath: Secondary | ICD-10-CM

## 2019-11-18 DIAGNOSIS — J384 Edema of larynx: Principal | ICD-10-CM

## 2019-11-18 DIAGNOSIS — H7010 Chronic mastoiditis, unspecified ear: Secondary | ICD-10-CM

## 2019-11-18 DIAGNOSIS — G8191 Hemiplegia, unspecified affecting right dominant side: Secondary | ICD-10-CM

## 2019-11-18 HISTORY — DX: Chronic mastoiditis, unspecified ear: H70.10

## 2019-11-18 LAB — COMPREHENSIVE METABOLIC PANEL
ALT: 29 U/L (ref 0–44)
AST: 21 U/L (ref 15–41)
Albumin: 3.1 g/dL — ABNORMAL LOW (ref 3.5–5.0)
Alkaline Phosphatase: 68 U/L (ref 38–126)
Anion gap: 13 (ref 5–15)
BUN: 10 mg/dL (ref 8–23)
CO2: 23 mmol/L (ref 22–32)
Calcium: 8.6 mg/dL — ABNORMAL LOW (ref 8.9–10.3)
Chloride: 102 mmol/L (ref 98–111)
Creatinine, Ser: 1.01 mg/dL — ABNORMAL HIGH (ref 0.44–1.00)
GFR calc Af Amer: >60 mL/min (ref >60)
GFR calc non Af Amer: 56 mL/min — ABNORMAL LOW (ref >60)
Glucose, Bld: 209 mg/dL — ABNORMAL HIGH (ref 70–99)
Potassium: 4.3 mmol/L (ref 3.5–5.1)
Sodium: 138 mmol/L (ref 135–145)
Total Bilirubin: 0.8 mg/dL (ref 0.3–1.2)
Total Protein: 6.7 g/dL (ref 6.5–8.1)

## 2019-11-18 LAB — APTT
aPTT: 88 seconds — ABNORMAL HIGH (ref 24–36)
aPTT: 76 seconds — ABNORMAL HIGH (ref 24–36)

## 2019-11-18 LAB — HEPARIN LEVEL (UNFRACTIONATED): Heparin Unfractionated: 1.1 IU/mL — ABNORMAL HIGH (ref 0.30–0.70)

## 2019-11-18 MED ORDER — HEPARIN (PORCINE) 25000 UT/250ML-% IV SOLN
900.0000 [IU]/h | INTRAVENOUS | Status: DC
  Administered 2019-11-18 – 2019-11-19 (×2): 900 [IU]/h via INTRAVENOUS
  Filled 2019-11-18 (×2): qty 250

## 2019-11-18 MED ORDER — SODIUM CHLORIDE 0.9 % IV SOLN
2.0000 g | Freq: Once | INTRAVENOUS | Status: AC
  Administered 2019-11-18: 2 g via INTRAVENOUS
  Filled 2019-11-18: qty 2

## 2019-11-18 MED ORDER — METHYLPREDNISOLONE SODIUM SUCC 125 MG IJ SOLR
125.0000 mg | INTRAMUSCULAR | Status: DC
  Administered 2019-11-19: 125 mg via INTRAVENOUS
  Filled 2019-11-18: qty 2

## 2019-11-18 MED ORDER — SODIUM CHLORIDE 0.9 % IV SOLN
2.0000 g | Freq: Two times a day (BID) | INTRAVENOUS | Status: DC
  Administered 2019-11-18 – 2019-11-20 (×5): 2 g via INTRAVENOUS
  Filled 2019-11-18 (×5): qty 2

## 2019-11-18 NOTE — Progress Notes (Signed)
ANTICOAGULATION and ANTIBIOTIC CONSULT NOTE - Initial Consult  Pharmacy Consult for heparin and cefepime Indication: atrial fibrillation and glottal edema  No Known Allergies  Patient Measurements: Height: 4\' 11"  (149.9 cm) Weight: 67.2 kg (148 lb 3.2 oz) IBW/kg (Calculated) : 43.2 Heparin Dosing Weight: 60kg  Vital Signs: Temp: 98.5 F (36.9 C) (06/25 1710) Temp Source: Oral (06/25 1710) BP: 127/96 (06/25 2034) Pulse Rate: 43 (06/25 2034)  Labs: Recent Labs    11/17/19 1725 11/17/19 2214  HGB 15.1*  --   HCT 46.9*  --   PLT 255  --   CREATININE 0.97  --   TROPONINIHS 8 8    Estimated Creatinine Clearance: 44.3 mL/min (by C-G formula based on SCr of 0.97 mg/dL).   Medical History: Past Medical History:  Diagnosis Date  . Atrial fibrillation ([place])   . Hypertension   . Stroke ([place])   . Thyroid disease     Assessment: 74yo female was recently admitted for subglottal edema, had mostly resolved by outpt ENT visit on 6/21, now presents c/o sore throat, wheezing, and difficulty breathing, to begin broader ABX (had been on Augmentin then amoxicillin as outpt); as pt to be NPO, to transition from Eliquis to UFH for Afib w/ h/o CVA; last dose of Eliquis taken 6/25 10a.  Goal of Therapy:  Heparin level 0.3-0.7 units/ml aPTT 66-102 seconds Monitor platelets by anticoagulation protocol: Yes   Plan:  Cefepime 2g IV Q12H. Heparin gtt at 900 units/hr; monitor heparin levels, aPTT (while Eliquis affects anti-Xa), and CBC.  Wynona Neat, PharmD, BCPS  11/18/2019,12:12 AM

## 2019-11-18 NOTE — Plan of Care (Signed)
  Problem: Clinical Measurements: Goal: Ability to maintain clinical measurements within normal limits will improve Outcome: Progressing   

## 2019-11-18 NOTE — Progress Notes (Signed)
ANTICOAGULATION CONSULT NOTE - Follow Up Consult  Pharmacy Consult for Heparin Indication: Afib w/ h/o CVA.  No Known Allergies  Patient Measurements: Height: 4\' 11"  (149.9 cm) Weight: 67.2 kg (148 lb 3.2 oz) IBW/kg (Calculated) : 43.2 Heparin Dosing Weight:    Vital Signs: Temp: 97.9 F (36.6 C) (06/26 0931) Temp Source: Oral (06/26 0931) BP: 126/74 (06/26 0931) Pulse Rate: 63 (06/26 0931)  Labs: Recent Labs    11/17/19 1725 11/17/19 2214 11/18/19 0334 11/18/19 0806  HGB 15.1*  --   --   --   HCT 46.9*  --   --   --   PLT 255  --   --   --   APTT  --   --   --  88*  HEPARINUNFRC  --   --   --  1.10*  CREATININE 0.97  --  1.01*  --   TROPONINIHS 8 8  --   --     Estimated Creatinine Clearance: 42.6 mL/min (A) (by C-G formula based on SCr of 1.01 mg/dL (H)).   Assessment: Anticoag: NPO, Eliquis (LD 6/25 AM)>>UFH for Afib w/ h/o CVA. - HL 1.1 from Eliquis affects, aPTT 88 in goal. No CBC  Goal of Therapy:  aPTT 66-102 seconds Monitor platelets by anticoagulation protocol: Yes   Plan:  Heparin gtt at 900 units/hr Daily aPTT, HL, and CBC   Jamie Mitchell S. Alford Highland, PharmD, BCPS Clinical Staff Pharmacist [email or web address] Alford Highland, [place] Cedar Stillinger 11/18/2019,10:07 AM

## 2019-11-18 NOTE — Plan of Care (Signed)
  Problem: Education: Goal: Knowledge of General Education information will improve Description Including pain rating scale, medication(s)/side effects and non-pharmacologic comfort measures Outcome: Progressing   

## 2019-11-18 NOTE — Progress Notes (Signed)
ANTICOAGULATION CONSULT NOTE - Follow Up Consult  Pharmacy Consult for Heparin Indication: Afib w/ h/o CVA.  No Known Allergies  Patient Measurements: Height: 4\' 11"  (149.9 cm) Weight: 67.2 kg (148 lb 3.2 oz) IBW/kg (Calculated) : 43.2 Heparin Dosing Weight:    Vital Signs: Temp: 98 F (36.7 C) (06/26 1500) Temp Source: Oral (06/26 1500) BP: 118/73 (06/26 1500) Pulse Rate: 66 (06/26 1500)  Labs: Recent Labs    11/17/19 1725 11/17/19 2214 11/18/19 0334 11/18/19 0806 11/18/19 1638  HGB 15.1*  --   --   --   --   HCT 46.9*  --   --   --   --   PLT 255  --   --   --   --   APTT  --   --   --  88* 76*  HEPARINUNFRC  --   --   --  1.10*  --   CREATININE 0.97  --  1.01*  --   --   TROPONINIHS 8 8  --   --   --     Estimated Creatinine Clearance: 42.6 mL/min (A) (by C-G formula based on SCr of 1.01 mg/dL (H)).   Assessment: Anticoag: NPO, Eliquis (LD 6/25 AM)>>UFH for Afib w/ h/o CVA.  APTT remains therapeutic at 76 this evening on 900 units/hr. RN reports no s/s of overt bleeding   Goal of Therapy:  aPTT 66-102 seconds Monitor platelets by anticoagulation protocol: Yes   Plan:  Heparin gtt at 900 units/hr Daily aPTT, HL, and CBC   Albertina Parr, PharmD., BCPS, BCCCP Clinical Pharmacist Clinical phone for 11/18/19 until 9:30pm: [phone] If after 11:30pm, please refer to [facility] for unit-specific pharmacist

## 2019-11-18 NOTE — Evaluation (Signed)
Physical Therapy Evaluation Patient Details Name: Jamie Mitchell MRN: [id number] DOB: 18-Jan-1946 Today's Date: 11/18/2019   History of Present Illness  Kalei [name] is a 74 y.o. female presenting with shortness of breath. PMH is significant for atrial fibrillation, hypothyroidism, hypertension, left MCA stroke s/p TPA and thrombectomy  Clinical Impression  Patient received in bed, Interpreter Broadus John # [phone] utilized during session. Patient agreeable to PT assessment, reports continued swelling feeling in mouth and throat. Some mild sob. Patient performed bed mobility independently, transfers with supervision. She ambulated 120 feet without ad and min guard. No lob or significant difficulty noted. She is slightly limited by sob. She will continue to benefit from skilled PT while here to improve activity tolerance for return home.     Follow Up Recommendations No PT follow up    Equipment Recommendations  None recommended by PT    Recommendations for Other Services       Precautions / Restrictions Precautions Precautions: None Restrictions Weight Bearing Restrictions: No      Mobility  Bed Mobility Overal bed mobility: Independent                Transfers Overall transfer level: Independent                  Ambulation/Gait     Assistive device: None Gait Pattern/deviations: Step-through pattern Gait velocity: WNL   General Gait Details: patient steady with ambulation, reports mild SOB with activity. O2 sats remained in 90%s throughout session.  Stairs            Wheelchair Mobility    Modified Rankin (Stroke Patients Only)       Balance Overall balance assessment: Independent                                           Pertinent Vitals/Pain Pain Assessment: Faces Faces Pain Scale: Hurts a little bit Pain Location: throat Pain Descriptors / Indicators: Sore Pain Intervention(s): Monitored during  session    Home Living Family/patient expects to be discharged to:: Private residence Living Arrangements: Children;Spouse/significant other;Other relatives Available Help at Discharge: Family;Available 24 hours/day Type of Home: House Home Access: Stairs to enter Entrance Stairs-Rails: Right;Left;Can reach both Entrance Stairs-Number of Steps: 7 Home Layout: One level Home Equipment: None      Prior Function Level of Independence: Independent               Hand Dominance        Extremity/Trunk Assessment   Upper Extremity Assessment Upper Extremity Assessment: Overall WFL for tasks assessed    Lower Extremity Assessment Lower Extremity Assessment: Overall WFL for tasks assessed    Cervical / Trunk Assessment Cervical / Trunk Assessment: Normal  Communication   Communication: Prefers language other than English  Cognition Arousal/Alertness: Awake/alert Behavior During Therapy: WFL for tasks assessed/performed Overall Cognitive Status: Within Functional Limits for tasks assessed                                        General Comments      Exercises     Assessment/Plan    PT Assessment Patient needs continued PT services  PT Problem List Decreased activity tolerance       PT Treatment Interventions Therapeutic activities;Gait training;Therapeutic exercise;Stair training;Patient/family  education    PT Goals (Current goals can be found in the Care Plan section)  Acute Rehab PT Goals Patient Stated Goal: to breathe better, go home PT Goal Formulation: With patient Time For Goal Achievement: 11/24/19 Potential to Achieve Goals: Good    Frequency Min 3X/week   Barriers to discharge        Co-evaluation               AM-PAC PT "6 Clicks" Mobility  Outcome Measure Help needed turning from your back to your side while in a flat bed without using bedrails?: None Help needed moving from lying on your back to sitting on the side  of a flat bed without using bedrails?: None Help needed moving to and from a bed to a chair (including a wheelchair)?: None Help needed standing up from a chair using your arms (e.g., wheelchair or bedside chair)?: None Help needed to walk in hospital room?: A Little Help needed climbing 3-5 steps with a railing? : A Little 6 Click Score: 22    End of Session Equipment Utilized During Treatment: Gait belt Activity Tolerance: Patient tolerated treatment well Patient left: in bed;with call bell/phone within reach;with bed alarm set Nurse Communication: Mobility status PT Visit Diagnosis: Difficulty in walking, not elsewhere classified (R26.2)    Time: 2500-3704 PT Time Calculation (min) (ACUTE ONLY): 12 min   Charges:   PT Evaluation $PT Eval Moderate Complexity: 1 Mod          Encarnacion Scioneaux, PT, GCS 11/18/19,11:22 AM

## 2019-11-18 NOTE — Progress Notes (Signed)
Family Medicine Teaching Service Daily Progress Note Intern Pager: [phone]  Patient name: Jamie Mitchell Medical record number: [id number] Date of birth: [birth year] Age: 74 y.o. Gender: female  Primary Care Provider: Charlott Rakes, MD Consultants: ENT Code Status: Full  Pt Overview and Major Events to Date:  6/25 - Admit for worsened   Assessment and Plan: Jamie Mitchell that I think is a 74 year old woman admitted for worsening glottal/slight glossal swelling.  She has a previous medical history significant for chronic subglottal swelling of unknown etiology, hypothyroidism, A. fib (on amiodarone), hypertension, Hx of MCA stroke.  Glottic edema with stridor Jamie Mitchell feels that her throat swelling has shown some mild improvement since she arrived at the hospital.  She reports that she still has a altered voice and a cough.  She denies any respiratory distress or trouble breathing.  We will reach out to ENT and ID today and continue current management with steroids and antibiotics. -ENT formally consulted, appreciate recommendations -ID consulted, appreciate recommendations -Follow-up SLP recommendations -Continue cefepime 2 g every 12 hours -Continue Solu-Medrol every 24 hours -Racemic epi nebulized every 4 hours as needed for dyspnea -Monitor respiratory status  Atrial fibrillation-stable Normal sinus rhythm this morning. -Continue cardiac monitoring for 24 hours, will discontinue at 24 hours if no further evidence of RVR -Continue heparin drip in place of NOAC in case of necessary procedures -Holding amiodarone for now we will consider restarting  GERD -Protonix IV  Hypothyroidism -Levothyroxine 44 mcg  History of MCA stroke -Heparin GTT as above transition to NOAC once taking p.o.  Jamie Mitchell is a Spanish speaker and her son also prefers Spanish. -[employer] interpreter tablet and live interpreters are available  FEN/GI: N.p.o. PPx:  Heparin drip  Disposition: 2-3 days of hospitalization anticipated prior to discharge  Subjective:  No acute events following transfer to the floor.  Overall, she feels improved compared to admission and is breathing and swallowing more comfortably.  She has no new complaints this morning.  Objective: Temp:  [97.9 F (36.6 C)-98.7 F (37.1 C)] 97.9 F (36.6 C) (06/26 0931) Pulse Rate:  [43-131] 63 (06/26 0931) Resp:  [13-28] 18 (06/26 0931) BP: (109-129)/(72-96) 126/74 (06/26 0931) SpO2:  [91 %-99 %] 95 % (06/26 0931) Weight:  [67.2 kg] 67.2 kg (06/25 1751)  Physical Exam: General: Alert and cooperative and appears to be in no acute distress HEENT: Raspy voice though she is speaking comfortably and swallowing comfortably. Cardio: Normal S1 and S2, no S3 or S4. Rhythm is regular. No murmurs or rubs.   Pulm: Transmitted upper airway sounds on auscultation.  Normal respiratory effort.  No respiratory distress. Abdomen: Bowel sounds normal. Abdomen soft and non-tender.  Extremities: No peripheral edema. Warm/ well perfused.  Strong radial pulse.  Laboratory: Recent Labs  Lab 11/17/19 1725  WBC 9.2  HGB 15.1*  HCT 46.9*  PLT 255   Recent Labs  Lab 11/17/19 1725 11/18/19 0334  NA 138 138  K 4.1 4.3  CL 106 102  CO2 20* 23  BUN 9 10  CREATININE 0.97 1.01*  CALCIUM 8.8* 8.6*  PROT  --  6.7  BILITOT  --  0.8  ALKPHOS  --  68  ALT  --  29  AST  --  21  GLUCOSE 147* 209*    CT Soft Tissue Neck W Contrast  Result Date: 11/17/2019 CLINICAL DATA:  Sore throat EXAM: CT NECK WITH CONTRAST TECHNIQUE: Multidetector CT imaging of the neck was performed using the  standard protocol following the bolus administration of intravenous contrast. CONTRAST:  4mL OMNIPAQUE IOHEXOL 300 MG/ML  SOLN COMPARISON:  11/10/2019, 10/30/2019 FINDINGS: Pharynx and larynx: There is persistent soft tissue thickening at the level of the glottis, increased since 11/10/2019 with an appearance similar to  10/30/2019. A small focal area of low attenuation is present posteriorly on the left also present on prior imaging (series 3, image 66). Salivary glands: Parotid and submandibular glands are unremarkable. Thyroid: Normal. Lymph nodes: No enlarged or abnormal density lymph nodes. Vascular: Major neck vessels are patent. Retropharyngeal course of the internal carotid arteries. Limited intracranial: No abnormal enhancement. Visualized orbits: Unremarkable. Mastoids and visualized paranasal sinuses: Minor paranasal sinus mucosal thickening. Chronic left middle ear and mastoid opacification. Mastoid bony sclerosis likely reflects sequelae of chronic inflammation. Skeleton: No acute abnormality. Upper chest: No new abnormality Other: None. IMPRESSION: Soft tissue fullness at the level of the glottis is increased since 11/10/2019 with appearance similar to 10/30/2019. There is an indeterminate small area of low density posteriorly on the left and a submucosal lesion is not excluded. Electronically Signed   By: Macy Mis M.D.   On: 11/17/2019 21:45   DG Chest Portable 1 View  Result Date: 11/17/2019 CLINICAL DATA:  Shortness of breath EXAM: PORTABLE CHEST 1 VIEW COMPARISON:  Portable exam 1844 hours compared to 10/30/2019 FINDINGS: Normal heart size, mediastinal contours, and pulmonary vascularity. Bibasilar subsegmental atelectasis. Central peribronchial thickening. No infiltrate, pleural effusion or pneumothorax. Bones demineralized. IMPRESSION: Bronchitic changes with bibasilar atelectasis. Electronically Signed   By: Lavonia Dana M.D.   On: 11/17/2019 19:12    Matilde Haymaker, MD 11/18/2019, 10:10 AM PGY-2, [place] Intern pager: [phone], text pages welcome

## 2019-11-18 NOTE — Evaluation (Signed)
Clinical/Bedside Swallow Evaluation Patient Details  Name: Jamie Mitchell MRN: [id number] Date of Birth: Oct 21, 1945  Today's Date: 11/18/2019 Time: SLP Start Time (ACUTE ONLY): 5427 SLP Stop Time (ACUTE ONLY): 0925 SLP Time Calculation (min) (ACUTE ONLY): 20 min  Past Medical History:  Past Medical History:  Diagnosis Date  . Atrial fibrillation ([place])   . Hypertension   . Stroke ([place])   . Thyroid disease    Past Surgical History:  Past Surgical History:  Procedure Laterality Date  . IR CT HEAD LTD  07/20/2019  . IR PERCUTANEOUS ART THROMBECTOMY/INFUSION INTRACRANIAL INC DIAG ANGIO  07/20/2019  . RADIOLOGY WITH ANESTHESIA N/A 07/20/2019   Procedure: IR WITH ANESTHESIA;  Surgeon: Luanne Bras, MD;  Location: [facility];  Service: Radiology;  Laterality: N/A;   HPI:  Patient is a 18 y .o. 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 CXR significant for bronchiolitic changes with bibasilar atelectasis; CT soft tissue neck significant for soft tissue fullness at level of the glottis (had similar appearance 6/7), however this is increased since most recent CT on 11/10/19. Questionable submucosal lesion in a small area of low density posteriorly to the left of the glottis.  Most recent MBS in 07/2019 revealed primary oral dysphagia with instance of sensed aspiration that fully cleared with reflexive cough. She had been admitted 6/7-6/9 with stridor and dyspnea and discharged with augmentin and steroid taper. She was seen by Dr. Lucia Gaskins, ENT on 6/21 and was doing well; laryngoscope performed was notable for redundant tissue near the right false vocal cord.   Assessment / Plan / Recommendation Clinical Impression  Patient presents with stridor, hoarse vocal quality and self-reported h/o globus sensation, coughing with expectoration  of phlegm, pain in throat. Patient was recently hospitalized (6/8) with similar symptoms. CT soft tissue neck significant for soft tissue fullness at level of the glottis, however this is reportedly increased since most recent CT on 6/18. During today's bedside swallow evaluation, patient exhibited no overt s/s of aspiraiton or penetration with thin liquids or puree solids and did not complain of pain, discomfort, however she did appear to have some oral to pharyngeal phase discoordination of swallow and appeared to have to more effortfully swallow with puree solids. In addition, patient reported globus sensation after 3 spoon bites of applesauce/puree solids. Patient already had outpatient order for MBS and as she is continuing with swallowing issues, will proceed with MBS during current hospitalization. SLP Visit Diagnosis: Dysphagia, unspecified (R13.10)    Aspiration Risk  Risk for inadequate nutrition/hydration;Mild aspiration risk    Diet Recommendation Ice chips PRN after oral care;Other (Comment) (water and/or ice PRN)   Liquid Administration via: Straw;Cup Medication Administration: Via alternative means Supervision: Patient able to self feed Compensations: Slow rate;Small sips/bites Postural Changes: Seated upright at 90 degrees;Remain upright for at least 30 minutes after po intake    Other  Recommendations Recommended Consults: Consider esophageal assessment Oral Care Recommendations: Oral care BID   Follow up Recommendations 24 hour supervision/assistance (TBD pending progress)      Frequency and Duration min 2x/week  2 weeks       Prognosis Prognosis for Safe Diet Advancement: Good      Swallow Study   General Date of Onset: 11/18/19 HPI: Patient is a 65 y .o. female with PMH: atrial fibrillation, hypothyroidism, HTN, left MCA CVA s/p  TPA and thrombectomy, GERD, hiatal hernia,  who presented to ED with SOB, throat pain that woke her from sleep, coughing, wheezing. CXR  significant for bronchiolitic changes with bibasilar atelectasis; CT soft tissue neck significant for soft tissue fullness at level of the glottis (had similar appearance 6/7), however this is increased since most recent CT on 11/10/19. Questionable submucosal lesion in a small area of low density posteriorly to the left of the glottis.  Most recent MBS in 07/2019 revealed primary oral dysphagia with instance of sensed aspiration that fully cleared with reflexive cough. She had been admitted 6/7-6/9 with stridor and dyspnea and discharged with augmentin and steroid taper. She was seen by Dr. Lucia Gaskins, ENT on 6/21 and was doing well; laryngoscope performed was notable for redundant tissue near the right false vocal cord. Type of Study: Bedside Swallow Evaluation Previous Swallow Assessment: MBS 07/2019; BSE 6/8: Thin liquids, no solids except meds crushed puree Diet Prior to this Study: NPO Temperature Spikes Noted: No Respiratory Status: Room air History of Recent Intubation: No Behavior/Cognition: Alert;Cooperative;Pleasant mood Oral Cavity Assessment: Within Functional Limits Oral Care Completed by SLP: No Oral Cavity - Dentition: Adequate natural dentition Vision: Functional for self-feeding Self-Feeding Abilities: Able to feed self Patient Positioning: Upright in bed Baseline Vocal Quality: Hoarse;Other (comment) (stridor) Volitional Cough: Strong Volitional Swallow: Able to elicit    Oral/Motor/Sensory Function Overall Oral Motor/Sensory Function: Within functional limits   Ice Chips     Thin Liquid Thin Liquid: Within functional limits Presentation: Straw;Self Fed Other Comments: No over s/s of aspiration or penetration; no c/o of discomfort or difficulty with swallow per patient report    Nectar Thick     Honey Thick     Puree Puree: Impaired Presentation: Self Fed Pharyngeal Phase Impairments: Suspected delayed Swallow;Other (comments) Other Comments: appearance of questionable  discoordination of oral to pharyngeal phase of swallow and patient exhibiting more effortful swallow with puree solids   Solid     Solid: Not tested      Sonia Baller, MA, [facility] Pager: [phone]

## 2019-11-18 NOTE — Progress Notes (Signed)
Modified Barium Swallow Progress Note  Patient Details  Name: Jamie Mitchell MRN: [id number] Date of Birth: 01/17/1946  Today's Date: 11/18/2019  Modified Barium Swallow completed.  Full report located under Chart Review in the Imaging Section.  Brief recommendations include the following:  Clinical Impression  Patient presents with a mild oropharyngeal dysphagia characterized by delayed anterior to posterior transit of regular solids boluses and decreased bolus cohesion with thin liquids and barium tablet. Pharyngeal phase was marked by swallow initiation delay to level of vallecular sinus for puree solids, regular solids and delay to vallecular, at times vallecular to pyriform sinus with thin liquids. Mild frequency of piecemeal swallowing and intermittent and brief instances of trace vallecular residuals which transited fully by second swallow. Of note, compared to MBS on 07/24/2019, today's MBS is improved; prior MBS showed significant oral transit delays as well as instance of sensed aspiration, however today's MBS showed mild oral transit delays and no instances of penetration or aspiration with liquids or solids. No reflux or retrograde movement of boluses observed, however patient did exhibit s/s of some SOB/gasping after successive PO's, as well as instances of belching/gagging but without regurgitation.   Swallow Evaluation Recommendations       SLP Diet Recommendations: Thin liquid;Dysphagia 3 (Mech soft) solids  (plan to continue clears and introduce more solids as patient tolerates)       Medication Administration: Whole meds with puree   Supervision: Patient able to self feed   Compensations: Slow rate;Small sips/bites   Postural Changes: Remain semi-upright after after feeds/meals (Comment);Seated upright at 90 degrees   Oral Care Recommendations: Oral care BID      Sonia Baller, MA, [facility] Pager: [phone]

## 2019-11-18 NOTE — Progress Notes (Signed)
Subjective: Seeing patient on behalf of Dr. Lucia Gaskins. Pt reports significant improvement in her breathing since admission. Her stridor has resolved. No shortness of breath at this time.   Objective: Vital signs in last 24 hours: Temp:  [97.9 F (36.6 C)-98.7 F (37.1 C)] 98 F (36.7 C) (06/26 1500) Pulse Rate:  [43-131] 66 (06/26 1500) Resp:  [13-28] 20 (06/26 1500) BP: (109-129)/(72-96) 118/73 (06/26 1500) SpO2:  [91 %-99 %] 96 % (06/26 1500) Weight:  [67.2 kg] 67.2 kg (06/25 1751)  General appearance: alert and cooperative Head: Normocephalic, without obvious abnormality, atraumatic  Eyes: Pupils are equal, round, reactive to light. Extraocular motion is intact.  Ears: Examination of the ears shows normal auricles and external auditory canals bilaterally.  Nose: Nasal examination shows normal mucosa, septum, turbinates.  Face: Facial examination shows no asymmetry. Palpation of the face elicit no significant tenderness.  Mouth: Oral cavity examination shows no mucosal edema. No significant trismus is noted.  Neck: Palpation of the neck reveals no lymphadenopathy or mass. The trachea is midline. The thyroid is not significantly enlarged.   Neuro: Cranial nerves 2-12 are all grossly in tact. Chest: No stridor or wheezing.  Recent Labs    11/17/19 1725  WBC 9.2  HGB 15.1*  HCT 46.9*  PLT 255   Recent Labs    11/17/19 1725 11/18/19 0334  NA 138 138  K 4.1 4.3  CL 106 102  CO2 20* 23  GLUCOSE 147* 209*  BUN 9 10  CREATININE 0.97 1.01*  CALCIUM 8.8* 8.6*    Medications:  I have reviewed the patient's current medications. Scheduled: . levothyroxine  44 mcg Intravenous Daily  . [START ON 11/19/2019] methylPREDNISolone (SOLU-MEDROL) injection  125 mg Intravenous Q24H  . pantoprazole (PROTONIX) IV  40 mg Intravenous Q0600   Continuous: . sodium chloride 100 mL/hr at 11/18/19 1239  . ceFEPime (MAXIPIME) IV 2 g (11/18/19 1236)  . heparin 900 Units/hr (11/18/19 0102)     Assessment/Plan: Pt's subglottic edema and respiratory distress has clinically improved.  - Continue treatment with steroid and antibiotic. - No acute ENT intervention is needed at this time. - Dr. Lucia Gaskins will resume care next week.   LOS: 1 day   Mohamadou Maciver W Malaquias Lenker 11/18/2019, 4:53 PM

## 2019-11-19 DIAGNOSIS — R791 Abnormal coagulation profile: Secondary | ICD-10-CM

## 2019-11-19 LAB — C4 COMPLEMENT: Complement C4, Body Fluid: 29 mg/dL (ref 12–38)

## 2019-11-19 LAB — CBC WITH DIFFERENTIAL/PLATELET
Abs Immature Granulocytes: 0.08 10*3/uL — ABNORMAL HIGH (ref 0.00–0.07)
Basophils Absolute: 0 10*3/uL (ref 0.0–0.1)
Basophils Relative: 0 %
Eosinophils Absolute: 0 10*3/uL (ref 0.0–0.5)
Eosinophils Relative: 0 %
HCT: 38 % (ref 36.0–46.0)
Hemoglobin: 12.5 g/dL (ref 12.0–15.0)
Immature Granulocytes: 1 %
Lymphocytes Relative: 13 %
Lymphs Abs: 1.7 10*3/uL (ref 0.7–4.0)
MCH: 29.3 pg (ref 26.0–34.0)
MCHC: 32.9 g/dL (ref 30.0–36.0)
MCV: 89 fL (ref 80.0–100.0)
Monocytes Absolute: 0.5 10*3/uL (ref 0.1–1.0)
Monocytes Relative: 4 %
Neutro Abs: 10.9 10*3/uL — ABNORMAL HIGH (ref 1.7–7.7)
Neutrophils Relative %: 82 %
Platelets: 226 10*3/uL (ref 150–400)
RBC: 4.27 MIL/uL (ref 3.87–5.11)
RDW: 15.9 % — ABNORMAL HIGH (ref 11.5–15.5)
WBC: 13.3 10*3/uL — ABNORMAL HIGH (ref 4.0–10.5)
nRBC: 0 % (ref 0.0–0.2)

## 2019-11-19 LAB — APTT: aPTT: 97 seconds — ABNORMAL HIGH (ref 24–36)

## 2019-11-19 LAB — RPR: RPR Ser Ql: NONREACTIVE

## 2019-11-19 LAB — HEPARIN LEVEL (UNFRACTIONATED): Heparin Unfractionated: 0.77 IU/mL — ABNORMAL HIGH (ref 0.30–0.70)

## 2019-11-19 LAB — HIV ANTIBODY (ROUTINE TESTING W REFLEX): HIV Screen 4th Generation wRfx: NONREACTIVE

## 2019-11-19 MED ORDER — APIXABAN 5 MG PO TABS
5.0000 mg | ORAL_TABLET | Freq: Two times a day (BID) | ORAL | Status: DC
  Administered 2019-11-19 – 2019-11-20 (×3): 5 mg via ORAL
  Filled 2019-11-19 (×3): qty 1

## 2019-11-19 MED ORDER — AMIODARONE HCL 200 MG PO TABS
200.0000 mg | ORAL_TABLET | Freq: Every day | ORAL | Status: DC
  Administered 2019-11-19 – 2019-11-20 (×2): 200 mg via ORAL
  Filled 2019-11-19 (×2): qty 1

## 2019-11-19 NOTE — Discharge Instructions (Addendum)
You were hospitalized at [facility] due to shortness of breath.  We expect this is from something you may be allergic to which improved after steroids and antibiotics  We are so glad you are feeling better.  Be sure to follow-up with Dr. Rexene Alberts, allergist as scheduled on 12/04/19 and Dr. Lucia Gaskins.  Please also be sure to follow-up with your clinic/PCP  at your earliest convenience.  Thank you for allowing [place] to take care of you.  Take care, [facility]    Information on my medicine - ELIQUIS (apixaban)  Why was Eliquis prescribed for you? Eliquis was prescribed for you to reduce the risk of a blood clot forming that can cause a stroke if you have a medical condition called atrial fibrillation (a type of irregular heartbeat).  What do You need to know about Eliquis ? Take your Eliquis TWICE DAILY - one tablet in the morning and one tablet in the evening with or without food. If you have difficulty swallowing the tablet whole please discuss with your pharmacist how to take the medication safely.  Take Eliquis exactly as prescribed by your doctor and DO NOT stop taking Eliquis without talking to the doctor who prescribed the medication.  Stopping may increase your risk of developing a stroke.  Refill your prescription before you run out.  After discharge, you should have regular check-up appointments with your healthcare provider that is prescribing your Eliquis.  In the future your dose may need to be changed if your kidney function or weight changes by a significant amount or as you get older.  What do you do if you miss a dose? If you miss a dose, take it as soon as you remember on the same day and resume taking twice daily.  Do not take more than one dose of ELIQUIS at the same time to make up a missed dose.  Important Safety Information A possible side effect of Eliquis is bleeding. You should call your healthcare provider right away if you experience any of  the following: ? Bleeding from an injury or your nose that does not stop. ? Unusual colored urine (red or dark brown) or unusual colored stools (red or black). ? Unusual bruising for unknown reasons. ? A serious fall or if you hit your head (even if there is no bleeding).  Some medicines may interact with Eliquis and might increase your risk of bleeding or clotting while on Eliquis. To help avoid this, consult your healthcare provider or pharmacist prior to using any new prescription or non-prescription medications, including herbals, vitamins, non-steroidal anti-inflammatory drugs (NSAIDs) and supplements.  This website has more information on Eliquis (apixaban): http://www.eliquis.com/eliquis/home

## 2019-11-19 NOTE — Plan of Care (Signed)
  Problem: Clinical Measurements: Goal: Ability to maintain clinical measurements within normal limits will improve Outcome: Progressing   

## 2019-11-19 NOTE — Progress Notes (Signed)
ANTICOAGULATION CONSULT NOTE - Follow Up Consult  Pharmacy Consult for Heparin Indication: Afib w/ h/o CVA.  No Known Allergies  Patient Measurements: Height: 4\' 11"  (149.9 cm) Weight: 67.2 kg (148 lb 3.2 oz) IBW/kg (Calculated) : 43.2 Heparin Dosing Weight:    Vital Signs: Temp: 98.9 F (37.2 C) (06/27 0559) Temp Source: Oral (06/27 0559) BP: 120/59 (06/27 0559) Pulse Rate: 68 (06/27 0559)  Labs: Recent Labs    11/17/19 1725 11/17/19 2214 11/18/19 0334 11/18/19 0806 11/18/19 1638 11/19/19 0426  HGB 15.1*  --   --   --   --  12.5  HCT 46.9*  --   --   --   --  38.0  PLT 255  --   --   --   --  226  APTT  --   --   --  88* 76* 97*  HEPARINUNFRC  --   --   --  1.10*  --  0.77*  CREATININE 0.97  --  1.01*  --   --   --   TROPONINIHS 8 8  --   --   --   --     Estimated Creatinine Clearance: 42.6 mL/min (A) (by C-G formula based on SCr of 1.01 mg/dL (H)).   Assessment: Anticoag: NPO, Eliquis (LD 6/25 AM)>>UFH for Afib w/ h/o CVA. - HL 0.77 elevated from residual Eliquis affects, aPTT 97 in goal. Hgb 12.5 down. Plts trended down to 226.  Goal of Therapy:  aPTT 66-102 seconds Monitor platelets by anticoagulation protocol: Yes   Plan:  Heparin gtt at 900 units/hr Daily aPTT, HL, and CBC CLD ordered 6/26. F/u to resume home meds now.   Shawnice Tilmon S. Alford Highland, PharmD, BCPS Clinical Staff Pharmacist [email or web address] Alford Highland, [employer] 11/19/2019,7:29 AM

## 2019-11-19 NOTE — Progress Notes (Signed)
  Speech Language Pathology Treatment: Dysphagia  Patient Details Name: Jamie Mitchell MRN: [id number] DOB: 23-Feb-1946 Today's Date: 11/19/2019 Time: 4585-9292 SLP Time Calculation (min) (ACUTE ONLY): 16 min  Assessment / Plan / Recommendation Clinical Impression  [employer] Spanish interpreter, Jamie Mitchell (ID# [id number]), was used for translation and interpretation. Pt reported that she has been tolerating the current diet well. No s/sx of aspiration were noted with any solids or liquids. She reported globus sensation with regular texture solids but denied any difficulty with other solids (i.e., puree and dyspahgia 3) or with liquids. A dyspahgia 3 diet with thin liquids is recommended at this time. SLP will continue to follow pt.    HPI HPI: Patient is a 43 y .o. 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 CXR significant for bronchiolitic changes with bibasilar atelectasis; CT soft tissue neck significant for soft tissue fullness at level of the glottis (had similar appearance 6/7), however this is increased since most recent CT on 11/10/19. Questionable submucosal lesion in a small area of low density posteriorly to the left of the glottis.  Most recent MBS in 07/2019 revealed primary oral dysphagia with instance of sensed aspiration that fully cleared with reflexive cough. She had been admitted 6/7-6/9 with stridor and dyspnea and discharged with augmentin and steroid taper. She was seen by Dr. Lucia Mitchell, ENT on 6/21 and was doing well; laryngoscope performed was notable for redundant tissue near the right false vocal cord.      SLP Plan  Continue with current plan of care       Recommendations  Diet recommendations: Dysphagia 3 (mechanical soft);Thin liquid Liquids provided via: Cup;Straw Medication Administration: Whole meds with  puree Compensations: Slow rate;Small sips/bites;Follow solids with liquid Postural Changes and/or Swallow Maneuvers: Seated upright 90 degrees                Oral Care Recommendations: Oral care BID Follow up Recommendations: None SLP Visit Diagnosis: Dysphagia, oropharyngeal phase (R13.12) Plan: Continue with current plan of care       Jamie Mitchell I. Hardin Negus, [place], [place] Office number [phone] Pager [place] 11/19/2019, 1:41 PM

## 2019-11-19 NOTE — Progress Notes (Addendum)
Family Medicine Teaching Service Daily Progress Note Intern Pager: [phone]  Patient name: Jamie Mitchell Medical record number: [id number] Date of birth: 1946/01/10 Age: 74 y.o. Gender: female  Primary Care Provider: Charlott Rakes, MD Consultants: ENT Code Status: Full  Pt Overview and Major Events to Date:  6/25 - Admit for worsened   Assessment and Plan: [name] that I think is a 74 year old woman admitted for worsening glottal/slight glossal swelling.  She has a previous medical history significant for chronic subglottal swelling of unknown etiology, hypothyroidism, A. fib (on amiodarone), hypertension, Hx of MCA stroke.  Glottic edema with stridor [name] continues to note mild improvement since admission.  She notes that she still has significant cough and some difficulty swallowing.  Overall, she is breathing comfortably despite some snoring.  We will continue to defer to ENT for appropriate work-up and management of this glottic/subglottic edema.  For now, we will continue steroids and antibiotics per ENT recommendations. -ENT following, appreciate recommendations -The primary team decided to hold off on consulting ID until there is further evidence of obvious infection. -Continue cefepime 2 g every 12 hours -Continue Solu-Medrol every 24 hours -Racemic epi nebulized every 4 hours as needed for dyspnea -Monitor respiratory status  Dysphagia MBS appears improved compared to previous.  SLP is recommending dysphagia 3 diet at this time. -Diet order placed  Atrial fibrillation-stable Normal sinus rhythm this morning. -Transition to home Eliquis -Restart home amiodarone  GERD -Protonix IV  Hypothyroidism -Levothyroxine 44 mcg  History of MCA stroke -Heparin GTT as above transition to NOAC once taking p.o.  Language barrier [name] is a Spanish speaker and her son also prefers Spanish. -[employer] interpreter tablet and live interpreters are  available  FEN/GI: N.p.o. PPx: Apixaban  Disposition: 2-3 days of hospitalization anticipated prior to discharge  Subjective:  No acute events overnight.  She continues to note mild improvement though she continues to have some coughing and mild difficulty swallowing.  She continues to note mild respiratory difficulties.  Objective: Temp:  [97.9 F (36.6 C)-98.9 F (37.2 C)] 98.9 F (37.2 C) (06/27 0559) Pulse Rate:  [63-68] 68 (06/27 0559) Resp:  [18-24] 24 (06/27 0559) BP: (104-126)/(57-74) 120/59 (06/27 0559) SpO2:  [92 %-96 %] 96 % (06/27 0559)  Physical Exam: General: Alert and cooperative and appears to be in no acute distress.  Resting in bed comfortably. HEENT: No significant swelling noted on exam of the anterior neck.  No obvious oropharyngeal abnormalities.  Moist mucous membranes. Cardio: Normal S1 and S2, no S3 or S4. Rhythm is regular. No murmurs or rubs.   Pulm: Clear to auscultation bilaterally, no crackles, wheezing, or diminished breath sounds. Normal respiratory effort Abdomen: Bowel sounds normal. Abdomen soft and non-tender.  Extremities: No peripheral edema. Warm/ well perfused.  Strong radial pulse. Neuro: Cranial nerves grossly intact    Laboratory: Recent Labs  Lab 11/17/19 1725 11/19/19 0426  WBC 9.2 13.3*  HGB 15.1* 12.5  HCT 46.9* 38.0  PLT 255 226   Recent Labs  Lab 11/17/19 1725 11/18/19 0334  NA 138 138  K 4.1 4.3  CL 106 102  CO2 20* 23  BUN 9 10  CREATININE 0.97 1.01*  CALCIUM 8.8* 8.6*  PROT  --  6.7  BILITOT  --  0.8  ALKPHOS  --  68  ALT  --  29  AST  --  21  GLUCOSE 147* 209*    DG Swallowing Func-Speech Pathology  Result Date: 11/18/2019 Objective Swallowing Evaluation:  Type of Study: MBS-Modified Barium Swallow Study  Patient Details Name: Jamie Mitchell MRN: [id number] Date of Birth: [birth year] Today's Date: 11/18/2019 Time: SLP Start Time (ACUTE ONLY): 1400 -SLP Stop Time (ACUTE ONLY): 1415 SLP Time  Calculation (min) (ACUTE ONLY): 15 min Past Medical History: Past Medical History: Diagnosis Date  Atrial fibrillation ([place])   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 CXR significant for bronchiolitic changes with bibasilar atelectasis; CT soft tissue neck significant for soft tissue fullness at level of the glottis (had similar appearance 6/7), however this is increased since most recent CT on 11/10/19. Questionable submucosal lesion in a small area of low density posteriorly to the left of the glottis.  Most recent MBS in 07/2019 revealed primary oral dysphagia with instance of sensed aspiration that fully cleared with reflexive cough. She had been admitted 6/7-6/9 with stridor and dyspnea and discharged with augmentin and steroid taper. She was seen by Dr. Lucia Gaskins, ENT on 6/21 and was doing well; laryngoscope performed was notable for redundant tissue near the right false vocal cord.  Subjective: pleasant, sitting in chair in radiology suite Assessment / Plan / Recommendation [facility] IP CLINICAL IMPRESSIONS 11/18/2019 Clinical Impression Patient presents with a mild oropharyngeal dysphagia characterized by delayed anterior to posterior transit of regular solids boluses and decreased bolus cohesion with thin liquids and barium tablet. Pharyngeal phase was marked by swallow initiation delay to level of  vallecular sinus for puree solids, regular solids and delay to vallecular, at times vallecular to pyriform sinus with thin liquids. Mild frequency of piecemeal swallowing and intermittent and brief instances of trace vallecular residuals which transited fully by second swallow. Of note, compared to MBS on 07/24/2019, today's MBS is improved; prior MBS showed significant oral transit delays as well as instance of sensed aspiration, however today's MBS showed mild oral transit delays and no instances of penetration or aspiration with liquids or solids. No reflux or retrograde movement of boluses observed, however patient did exhibit s/s of some SOB/gasping after successive PO's, as well as instances of belching/gagging but without regurgitation. SLP Visit Diagnosis Dysphagia, oropharyngeal phase (R13.12) Attention and concentration deficit following -- Frontal lobe and executive function deficit following -- Impact on safety and function No limitations   [facility] IP TREATMENT RECOMMENDATION 11/18/2019 Treatment Recommendations Therapy as outlined in treatment plan below   Prognosis 11/18/2019 Prognosis for Safe Diet Advancement Good Barriers to Reach Goals -- Barriers/Prognosis Comment -- [facility] IP DIET RECOMMENDATION 11/18/2019 SLP Diet Recommendations Thin liquid;Dysphagia 3 (Mech soft) solids Liquid Administration via -- Medication Administration Whole meds with puree Compensations Slow rate;Small sips/bites Postural Changes Remain semi-upright after after feeds/meals (Comment);Seated upright at 90 degrees   CHL IP OTHER RECOMMENDATIONS 11/18/2019 Recommended Consults -- Oral Care Recommendations Oral care BID Other Recommendations --   [facility] IP FOLLOW UP RECOMMENDATIONS 11/18/2019 Follow up Recommendations None   [facility] IP FREQUENCY AND DURATION 11/18/2019 Speech Therapy Frequency (ACUTE  ONLY) min 1 x/week Treatment Duration 2 weeks      CHL IP ORAL PHASE 11/18/2019 Oral Phase Impaired Oral - Pudding Teaspoon -- Oral - Pudding Cup --  Oral - Honey Teaspoon -- Oral - Honey Cup -- Oral - Nectar Teaspoon -- Oral - Nectar Cup -- Oral - Nectar Straw -- Oral - Thin Teaspoon -- Oral - Thin Cup -- Oral - Thin Straw -- Oral - Puree Weak lingual manipulation;Delayed oral transit;Reduced posterior propulsion Oral - Mech Soft -- Oral - Regular Weak lingual manipulation;Reduced posterior propulsion;Delayed oral transit Oral - Multi-Consistency -- Oral - Pill Reduced posterior propulsion;Delayed oral transit;Decreased bolus cohesion Oral Phase - Comment --  CHL IP PHARYNGEAL PHASE 11/18/2019 Pharyngeal Phase Impaired Pharyngeal- Pudding Teaspoon -- Pharyngeal -- Pharyngeal- Pudding Cup -- Pharyngeal -- Pharyngeal- Honey Teaspoon -- Pharyngeal -- Pharyngeal- Honey Cup -- Pharyngeal -- Pharyngeal- Nectar Teaspoon -- Pharyngeal -- Pharyngeal- Nectar Cup -- Pharyngeal -- Pharyngeal- Nectar Straw -- Pharyngeal -- Pharyngeal- Thin Teaspoon -- Pharyngeal -- Pharyngeal- Thin Cup Delayed swallow initiation-vallecula Pharyngeal -- Pharyngeal- Thin Straw Delayed swallow initiation-pyriform sinuses;Delayed swallow initiation-vallecula Pharyngeal -- Pharyngeal- Puree WFL Pharyngeal -- Pharyngeal- Mechanical Soft -- Pharyngeal -- Pharyngeal- Regular Delayed swallow initiation-vallecula Pharyngeal -- Pharyngeal- Multi-consistency -- Pharyngeal -- Pharyngeal- Pill -- Pharyngeal -- Pharyngeal Comment --  CHL IP CERVICAL ESOPHAGEAL PHASE 11/18/2019 Cervical Esophageal Phase WFL Pudding Teaspoon -- Pudding Cup -- Honey Teaspoon -- Honey Cup -- Nectar Teaspoon -- Nectar Cup -- Nectar Straw -- Thin Teaspoon -- Thin Cup -- Thin Straw -- Puree -- Mechanical Soft -- Regular -- Multi-consistency -- Pill -- Cervical Esophageal Comment -- Sonia Baller, MA, CCC-SLP Speech Therapy [facility] Acute Rehab Pager: [phone]                Matilde Haymaker, MD 11/19/2019, 8:51 AM PGY-2, [place] Intern pager: [phone], text pages welcome

## 2019-11-20 ENCOUNTER — Other Ambulatory Visit: Payer: Self-pay | Admitting: *Deleted

## 2019-11-20 LAB — RAPID URINE DRUG SCREEN, HOSP PERFORMED
Amphetamines: NOT DETECTED
Barbiturates: NOT DETECTED
Benzodiazepines: NOT DETECTED
Cocaine: NOT DETECTED
Opiates: NOT DETECTED
Tetrahydrocannabinol: NOT DETECTED

## 2019-11-20 MED ORDER — PREDNISONE 10 MG PO TABS
ORAL_TABLET | ORAL | 0 refills | Status: DC
Start: 2019-11-20 — End: 2019-11-20

## 2019-11-20 MED ORDER — LEVOTHYROXINE SODIUM 88 MCG PO TABS: 88.0000 ug | ORAL_TABLET | Freq: Every day | ORAL | Status: DC

## 2019-11-20 MED ORDER — PREDNISONE 10 MG PO TABS
ORAL_TABLET | ORAL | 0 refills | Status: DC
Start: 2019-11-21 — End: 2019-12-04

## 2019-11-20 MED ORDER — AMOXICILLIN 875 MG PO TABS: 875.0000 mg | ORAL_TABLET | Freq: Every day | ORAL | 0 refills | Status: AC

## 2019-11-20 MED ORDER — FAMOTIDINE 20 MG PO TABS
20.0000 mg | ORAL_TABLET | Freq: Two times a day (BID) | ORAL | 1 refills | Status: DC
Start: 2019-11-20 — End: 2020-01-15

## 2019-11-20 MED ORDER — PANTOPRAZOLE SODIUM 40 MG PO TBEC
40.0000 mg | DELAYED_RELEASE_TABLET | Freq: Every day | ORAL | Status: DC
  Filled 2019-11-20: qty 1

## 2019-11-20 MED ORDER — CETIRIZINE HCL 10 MG PO TABS: 10.0000 mg | ORAL_TABLET | Freq: Two times a day (BID) | ORAL | 0 refills | Status: DC

## 2019-11-20 MED ORDER — CETIRIZINE HCL 10 MG PO TABS
10.0000 mg | ORAL_TABLET | Freq: Every day | ORAL | 2 refills | Status: DC
Start: 2019-11-20 — End: 2019-12-04

## 2019-11-20 MED ORDER — EPINEPHRINE 0.3 MG/0.3ML IJ SOAJ: 0.3000 mg | INTRAMUSCULAR | 1 refills | Status: AC | PRN

## 2019-11-20 NOTE — Discharge Summary (Addendum)
[facility] Discharge Summary  Patient name: Jamie Mitchell Medical record number: [id number] Date of birth: 02-27-1946 Age: 74 y.o. 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 Soft, Nontender, Nondistended. MSK: no lower extremity edema SKIN: warm, dry NEURO: grossly normal, moves all extremities appropriately PSYCH: Normal affect, appropriate speech and behavior  Taken from Dr. Birdena Crandall progress note on the day of discharge   Brief Hospital Course:   Glottic Edema with Stridor Patient presented to the emergency department with shortness of breath and stridor. She was given racemic epi in the emergency department. Dr. Lucia Gaskins, ENT, was consulted in the ED who recommended steroids (Solu-Medrol), antibiotics (Cefepime) and repeat CT soft tissue neck.  CT was significant for soft tissue fullness at the level of the glottis with similar appearance to the 10/30/2019 admission however was increased since her last CT on 11/10/2019. She was kept NPO overnight and her stridor improved.  ENT, Dr. Benjamine Mola, evaluated patient but had no additional recommendations. Patient's respiratory status improved and stridor decreased.  Patient was discharged on a prednisone taper (until 12/09/19) and Augmentin for 5 additional days.  She  was scheduled with follow-up with [facility] (Dr. Burnett Corrente).  Labs were drawn for this appointment prior to discharge. Patient should also follow up with ENT, per Dr. Lucia Gaskins, at a tertiary center. Patient discharged with Rx for EPI pen.   Issues for Follow Up:  1. Patient to continue steroid taper at least until she sees Allergist, Dr. Maudie Mercury on 12/04/19.  2. Recommend Tertiary ENT referral, [facility] vs [facility].  3. Per Dr. Pollie Friar outpatient notes, patient may need direct laryngoscopy under general anesthesia.    Significant Procedures: None   Significant Labs and Imaging:  Recent Labs  Lab 11/17/19 1725 11/19/19 0426  WBC 9.2 13.3*  HGB 15.1* 12.5  HCT 46.9* 38.0  PLT 255 226   Recent Labs  Lab 11/17/19 1725 11/18/19 0334  NA 138 138  K 4.1 4.3  CL 106 102  CO2 20* 23  GLUCOSE 147* 209*  BUN 9 10  CREATININE 0.97 1.01*  CALCIUM 8.8* 8.6*  ALKPHOS  --  68  AST  --  21  ALT  --  29  ALBUMIN  --  3.1*    CT Soft Tissue Neck W Contrast  Result Date: 11/17/2019 CLINICAL DATA:  Sore throat EXAM: CT NECK WITH CONTRAST TECHNIQUE: Multidetector CT imaging of the neck was performed using the standard protocol following the bolus administration of intravenous contrast. CONTRAST:  60mL OMNIPAQUE IOHEXOL 300 MG/ML  SOLN COMPARISON:  11/10/2019, 10/30/2019 FINDINGS: Pharynx and larynx: There is persistent soft tissue thickening at the level of the glottis, increased since 11/10/2019 with an appearance similar to 10/30/2019. A small focal area of low attenuation is present posteriorly on the left also present on prior imaging (series 3, image 66). Salivary glands: Parotid and submandibular glands are unremarkable. Thyroid:  Normal. Lymph nodes: No enlarged or abnormal density lymph nodes. Vascular: Major neck vessels are patent. Retropharyngeal course of the internal carotid arteries. Limited intracranial: No abnormal enhancement. Visualized orbits: Unremarkable. Mastoids and  visualized paranasal sinuses: Minor paranasal sinus mucosal thickening. Chronic left middle ear and mastoid opacification. Mastoid bony sclerosis likely reflects sequelae of chronic inflammation. Skeleton: No acute abnormality. Upper chest: No new abnormality Other: None. IMPRESSION: Soft tissue fullness at the level of the glottis is increased since 11/10/2019 with appearance similar to 10/30/2019. There is an indeterminate small area of low density posteriorly on the left and a submucosal lesion is not excluded. Electronically Signed   By: Macy Mis M.D.   On: 11/17/2019 21:45   DG Chest Portable 1 View  Result Date: 11/17/2019 CLINICAL DATA:  Shortness of breath EXAM: PORTABLE CHEST 1 VIEW COMPARISON:  Portable exam 1844 hours compared to 10/30/2019 FINDINGS: Normal heart size, mediastinal contours, and pulmonary vascularity. Bibasilar subsegmental atelectasis. Central peribronchial thickening. No infiltrate, pleural effusion or pneumothorax. Bones demineralized. IMPRESSION: Bronchitic changes with bibasilar atelectasis. Electronically Signed   By: Lavonia Dana M.D.   On: 11/17/2019 19:12   DG Swallowing Func-Speech Pathology  Result Date: 11/18/2019 Objective Swallowing Evaluation: Type of Study: MBS-Modified Barium Swallow Study  Patient Details Name: Jamie Mitchell MRN: [id number] Date of Birth: 26-Oct-1945 Today's Date: 11/18/2019 Time: SLP Start Time (ACUTE ONLY): 1400 -SLP Stop Time (ACUTE ONLY): 1415 SLP Time Calculation (min) (ACUTE ONLY): 15 min Past Medical History: Past Medical History: Diagnosis Date . Atrial fibrillation ([place])  . 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 Hypertension  . Stroke ([place])  . Thyroid disease  Past Surgical History: Past Surgical History: Procedure Laterality Date . IR CT HEAD LTD  07/20/2019 . IR PERCUTANEOUS ART THROMBECTOMY/INFUSION INTRACRANIAL INC DIAG ANGIO  07/20/2019 . RADIOLOGY WITH ANESTHESIA N/A 07/20/2019  Procedure: IR WITH ANESTHESIA;  Surgeon: Luanne Bras, MD;  Location: [facility];  Service: Radiology;  Laterality: N/A; HPI: Patient is a 74 y .o. 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 CXR significant for bronchiolitic changes with bibasilar atelectasis; CT soft tissue neck significant for soft tissue fullness at level of the glottis (had similar appearance 6/7), however this is increased since most recent CT on 11/10/19. Questionable submucosal lesion in a small area of low density posteriorly to the left of the glottis.  Most recent MBS in 07/2019 revealed primary oral dysphagia with instance of sensed aspiration that fully cleared with reflexive cough. She had been admitted 6/7-6/9 with stridor and dyspnea and discharged with augmentin and steroid taper. She was seen by Dr. Lucia Gaskins, ENT on 6/21 and was doing well; laryngoscope performed was notable for redundant tissue near the right false vocal cord.  Subjective: pleasant, sitting in chair in radiology suite Assessment / Plan / Recommendation CHL IP CLINICAL IMPRESSIONS 11/18/2019 Clinical Impression Patient presents with a mild oropharyngeal dysphagia characterized by delayed anterior to posterior transit of regular solids boluses and decreased bolus cohesion with thin liquids and barium tablet. Pharyngeal phase was marked by swallow initiation delay to level of vallecular sinus for puree solids, regular solids and delay to vallecular, at times vallecular to pyriform sinus with thin liquids. Mild frequency of piecemeal swallowing and intermittent and brief instances of trace vallecular residuals which transited fully by second swallow. Of note, compared to MBS on 07/24/2019, today's MBS  is improved; prior MBS showed significant oral transit delays as well as instance of sensed aspiration, however today's MBS showed mild oral transit delays and no  instances of penetration or aspiration with liquids or solids. No reflux or retrograde movement of boluses observed, however patient did exhibit s/s of some SOB/gasping after successive PO's, as well as instances of belching/gagging but without regurgitation. SLP Visit Diagnosis Dysphagia, oropharyngeal phase (R13.12) Attention and concentration deficit following -- Frontal lobe and executive function deficit following -- Impact on safety and function No limitations   CHL IP TREATMENT RECOMMENDATION 11/18/2019 Treatment Recommendations Therapy as outlined in treatment plan below   Prognosis 11/18/2019 Prognosis for Safe Diet Advancement Good Barriers to Reach Goals -- Barriers/Prognosis Comment -- CHL IP DIET RECOMMENDATION 11/18/2019 SLP Diet Recommendations Thin liquid;Dysphagia 3 (Mech soft) solids Liquid Administration via -- Medication Administration Whole meds with puree Compensations Slow rate;Small sips/bites Postural Changes Remain semi-upright after after feeds/meals (Comment);Seated upright at 90 degrees   CHL IP OTHER RECOMMENDATIONS 11/18/2019 Recommended Consults -- Oral Care Recommendations Oral care BID Other Recommendations --   CHL IP FOLLOW UP RECOMMENDATIONS 11/18/2019 Follow up Recommendations None   CHL IP FREQUENCY AND DURATION 11/18/2019 Speech Therapy Frequency (ACUTE ONLY) min 1 x/week Treatment Duration 2 weeks      CHL IP ORAL PHASE 11/18/2019 Oral Phase Impaired Oral - Pudding Teaspoon -- Oral - Pudding Cup -- Oral - Honey Teaspoon -- Oral - Honey Cup -- Oral - Nectar Teaspoon -- Oral - Nectar Cup -- Oral - Nectar Straw -- Oral - Thin Teaspoon -- Oral - Thin Cup -- Oral - Thin Straw -- Oral - Puree Weak lingual manipulation;Delayed oral transit;Reduced posterior propulsion Oral - Mech Soft -- Oral - Regular Weak lingual manipulation;Reduced posterior propulsion;Delayed oral transit Oral - Multi-Consistency -- Oral - Pill Reduced posterior propulsion;Delayed oral transit;Decreased bolus cohesion  Oral Phase - Comment --  CHL IP PHARYNGEAL PHASE 11/18/2019 Pharyngeal Phase Impaired Pharyngeal- Pudding Teaspoon -- Pharyngeal -- Pharyngeal- Pudding Cup -- Pharyngeal -- Pharyngeal- Honey Teaspoon -- Pharyngeal -- Pharyngeal- Honey Cup -- Pharyngeal -- Pharyngeal- Nectar Teaspoon -- Pharyngeal -- Pharyngeal- Nectar Cup -- Pharyngeal -- Pharyngeal- Nectar Straw -- Pharyngeal -- Pharyngeal- Thin Teaspoon -- Pharyngeal -- Pharyngeal- Thin Cup Delayed swallow initiation-vallecula Pharyngeal -- Pharyngeal- Thin Straw Delayed swallow initiation-pyriform sinuses;Delayed swallow initiation-vallecula Pharyngeal -- Pharyngeal- Puree WFL Pharyngeal -- Pharyngeal- Mechanical Soft -- Pharyngeal -- Pharyngeal- Regular Delayed swallow initiation-vallecula Pharyngeal -- Pharyngeal- Multi-consistency -- Pharyngeal -- Pharyngeal- Pill -- Pharyngeal -- Pharyngeal Comment --  CHL IP CERVICAL ESOPHAGEAL PHASE 11/18/2019 Cervical Esophageal Phase WFL Pudding Teaspoon -- Pudding Cup -- Honey Teaspoon -- Honey Cup -- Nectar Teaspoon -- Nectar Cup -- Nectar Straw -- Thin Teaspoon -- Thin Cup -- Thin Straw -- Puree -- Mechanical Soft -- Regular -- Multi-consistency -- Pill -- Cervical Esophageal Comment -- Sonia Baller, MA, CCC-SLP Speech Therapy [facility] Acute Rehab Pager: [phone]                 Results/Tests Pending at Time of Discharge:   Labs requested by allergist, Dr. Maudie Mercury : Unresulted Labs (From admission, onward) Comment          Start     Ordered   11/20/19 1726  Miscellaneous [facility] test (send-out)  Once,   R       Comments: [id number]   Question:  Test name / description:  Answer:  Alpha-gal panel   11/20/19 1726   11/20/19 1726  Miscellaneous [facility] test (send-out)  Once,   R       Comments: [id number]   Question:  Test name / description:  Answer:  complement C1 esterase inhibitor, functional   11/20/19 1726   11/20/19 1724  C3 complement  Once,   R        11/20/19 1726   11/20/19 1724  C4 complement  Once,   R         11/20/19 1726   11/20/19 1724  Tryptase  Once,   R        11/20/19 1726   11/20/19 1724  C1 Esterase Inhibitor  Once,   R        11/20/19 1726   11/20/19 1724  Miscellaneous [facility] test (send-out)  Once,   R       Comments: [id number]   Question:  Test name / description:  Answer:  complement C1q, quantitative   11/20/19 1726           Discharge Medications:  Allergies as of 11/20/2019   No Known Allergies     Medication List    TAKE these medications   albuterol 108 (90 Base) MCG/ACT inhaler Commonly known as: VENTOLIN HFA Inhale 1-2 puffs into the lungs every 6 (six) hours as needed for wheezing or shortness of breath.   amiodarone 200 MG tablet Commonly known as: PACERONE Take 1 tablet (200 mg total) by mouth daily.   amoxicillin 875 MG tablet Commonly known as: AMOXIL Take 1 tablet (875 mg total) by mouth at bedtime for 5 days.   apixaban 5 MG Tabs tablet Commonly known as: ELIQUIS Take 1 tablet (5 mg total) by mouth 2 (two) times daily.   atorvastatin 40 MG tablet Commonly known as: LIPITOR Take 1 tablet (40 mg total) by mouth daily. What changed:   how to take this  when to take this   cetirizine 10 MG tablet Commonly known as: ZyrTEC Allergy Take 1 tablet (10 mg total) by mouth daily. What changed: You were already taking a medication with the same name, and this prescription was added. Make sure you understand how and when to take each.   cetirizine 10 MG tablet Commonly known as: ZyrTEC Allergy Take 1 tablet (10 mg total) by mouth 2 (two) times daily. What changed: when to take this   diclofenac Sodium 1 % Gel Commonly known as: Voltaren Apply 2 g topically 4 (four) times daily.   EPINEPHrine 0.3 mg/0.3 mL Soaj injection Commonly known as: EPI-PEN Inject 0.3 mLs (0.3 mg total) into the muscle as needed for anaphylaxis.   famotidine 20 MG tablet Commonly known as: PEPCID Take 1 tablet (20 mg total) by mouth 2 (two) times daily.    levothyroxine 88 MCG tablet Commonly known as: SYNTHROID Take 1 tablet (88 mcg total) by mouth daily before breakfast.   omeprazole 40 MG capsule Commonly known as: PRILOSEC Take 40 mg by mouth daily.   predniSONE 10 MG tablet Commonly known as: DELTASONE Take 4 tablets (40 mg total) by mouth daily for 5 days, THEN 2 tablets (20 mg total) daily for 5 days, THEN 1 tablet (10 mg total) daily for 5 days, THEN 0.5 tablets (5 mg total) daily for 5 days. Start taking on: November 21, 2019       Discharge Instructions: Please refer to Patient Instructions section of EMR for full details.  Patient was counseled important signs and symptoms that should prompt return to medical care, changes in medications, dietary instructions, activity restrictions, and follow  up appointments.   Follow-Up Appointments:  Follow-up Information    Garnet Sierras, DO Follow up on 12/04/2019.   Specialty: Allergy Why: [place] At 8:30 AM  Contact information: [address] [place] [address] [phone]        Charlott Rakes, MD. Schedule an appointment as soon as possible for a visit.   Specialty: Family Medicine Contact information: [place] [address] [phone]        Donato Heinz, MD .   Specialty: Cardiology Contact information: [address] [place] [address] [phone]               Lyndee Hensen, DO 11/20/2019, 5:47 PM PGY-1, [place]

## 2019-11-20 NOTE — Progress Notes (Signed)
  Speech Language Pathology Treatment: Dysphagia  Patient Details Name: Jamie Mitchell MRN: [id number] DOB: [birth year] Today's Date: 11/20/2019 Time: 9470-9628 SLP Time Calculation (min) (ACUTE ONLY): 8 min  Assessment / Plan / Recommendation Clinical Impression  Pt was seen for skilled ST targeting diet tolerance.  She was encountered awake/alert with family present at bedside following OT treatment session.  [employer] Spanish interpreter was used for the entirety of this session.  Pt reported that she was tolerating her current diet well without coughing, choking, or discomfort.  Family member confirmed this.  Pt was seen with trials of thin liquid via straw sip and regular solids.  Mastication of regular solids was mildly prolonged, but it was effective and no oral residue was observed.  No overt s/sx of aspiration were observed with liquid or solid trials despite challenging.  Spoke with pt regarding preference between Dysphagia 3 (soft) solids and regular solids, and she stated that she would prefer to remain on Dysphagia 3 solids.  Therefore, recommend continuation of Dysphagia 3 (soft) solids and thin liquids with medications administered whole in puree.  No further skilled ST is warranted at this time.  Please re-consult if additional needs arise.     HPI HPI: Patient is a Jamie Mitchell with PMH: atrial fibrillation, hypothyroidism, HTN, left MCA CVA s/p TPA and thrombectomy, GERD, hiatal hernia,  who presented to ED with SOB, throat pain that woke her from sleep, coughing, wheezing. CXR significant for bronchiolitic changes with bibasilar atelectasis; CT soft tissue neck significant for soft tissue fullness at level of the glottis (had similar appearance 6/7), however this is increased since most recent CT on 11/10/19. Questionable submucosal lesion in a small area of low density posteriorly to the left of the glottis.  Most recent MBS in 07/2019 revealed primary oral dysphagia with instance  of sensed aspiration that fully cleared with reflexive cough. She had been admitted 6/7-6/9 with stridor and dyspnea and discharged with augmentin and steroid taper. She was seen by Dr. Lucia Gaskins, ENT on 6/21 and was doing well; laryngoscope performed was notable for redundant tissue near the right false vocal cord.      SLP Plan  All goals met       Recommendations  Diet recommendations: Dysphagia 3 (mechanical soft);Thin liquid Liquids provided via: Cup;Straw Medication Administration: Whole meds with puree Supervision: Patient able to self feed Compensations: Slow rate;Small sips/bites;Follow solids with liquid Postural Changes and/or Swallow Maneuvers: Seated upright 90 degrees                Oral Care Recommendations: Oral care BID Follow up Recommendations: None SLP Visit Diagnosis: Dysphagia, oropharyngeal phase (R13.12) Plan: All goals met                      Colin Mulders M.S., CCC-SLP Acute Rehabilitation Services Office: [phone]  [place] 11/20/2019, 10:15 AM

## 2019-11-20 NOTE — Progress Notes (Signed)
DISCHARGE NOTE HOME Jamie Mitchell to be discharged to home per MD order. Discussed prescriptions and follow up appointments with the patient and [name]. Prescriptions and medication list explained in detail. Patient  And her son verbalized understanding.Explanation was aided by interpreter.  Skin clean, dry and intact without evidence of skin break down, no evidence of skin tears noted. IV catheter discontinued intact. Site without signs and symptoms of complications. Dressing and pressure applied. Pt denies pain at the site currently. No complaints noted.  Patient free of lines, drains, and wounds.   An After Visit Summary (AVS) was printed and given to the patient. Patient escorted via wheelchair, and discharged home via private auto.  [place], [name], RN

## 2019-11-20 NOTE — Patient Outreach (Signed)
Jamie Mitchell) Care Management  11/20/2019  Jamie Mitchell 1946/02/13 [id number]   Received [place] / Epic in-basket patient hospitalized on 2019/11/25 and remains inpatient. Following in-basket sent to [facility] ([facility] and [facility]):  Gilles Chiquito, Patient hospitalized on November 25, 2019 and remains inpatient.  Please follow up for discharge planning / disposition.  No patient  outreach at this time due to hospitalization.   RNCM will follow up post discharge if hospitalization is less than 10 days and if greater than 10 days, will close patient's case.    [name] H. Annia Friendly, BSN, [place] [facility] Telephonic CM Phone: [phone] Fax: [phone]

## 2019-11-20 NOTE — Progress Notes (Signed)
Occupational Therapy Evaluation Patient Details Name: Jamie Mitchell MRN: [id number] DOB: [birth year] Today's Date: 11/20/2019    History of Present Illness Luana [name] is a 74 y.o. female presenting with shortness of breath. 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  She is independent at prior level without any use of devices.  Today patient's only complaint is her throat hurting and causing some shortness of breath with activity.  She is able to perform bed mobility with independence and transfers with supervision.  All ADLs completed with supervision, mostly for safety with lines.  Patient would benefit from further OT to increase activity tolerance for improved independence at home.  Will continue to follow with OT acutely to address the deficits listed below.      Follow Up Recommendations  No OT follow up;Supervision - Intermittent    Equipment Recommendations  None recommended by OT    Recommendations for Other Services       Precautions / Restrictions Precautions Precautions: None Restrictions Weight Bearing Restrictions: No      Mobility Bed Mobility Overal bed mobility: Independent                Transfers Overall transfer level: Needs assistance Equipment used: None Transfers: Sit to/from Stand;Stand Pivot Transfers Sit to Stand: Supervision Stand pivot transfers: Supervision       General transfer comment: Supervision for safety with lines    Balance Overall balance assessment: No apparent balance deficits (not formally assessed)                                         ADL either performed or assessed with clinical judgement   ADL Overall ADL's : Needs assistance/impaired Eating/Feeding: Independent;Sitting   Grooming: Wash/dry hands;Wash/dry face;Oral  care;Supervision/safety;Standing   Upper Body Bathing: Sitting;Supervision/ safety   Lower Body Bathing: Supervison/ safety;Sit to/from stand   Upper Body Dressing : Supervision/safety;Sitting   Lower Body Dressing: Supervision/safety;Sit to/from stand   Toilet Transfer: Supervision/safety;Regular Toilet           Functional mobility during ADLs: Supervision/safety General ADL Comments: Supervision for help with lines     Vision         Perception     Praxis      Pertinent Vitals/Pain Pain Assessment: Faces Faces Pain Scale: Hurts a little bit Pain Location: throat Pain Descriptors / Indicators: Sore Pain Intervention(s): Monitored during session;Repositioned     Hand Dominance Right   Extremity/Trunk Assessment Upper Extremity Assessment Upper Extremity Assessment: Overall WFL for tasks assessed   Lower Extremity Assessment Lower Extremity Assessment: Defer to PT evaluation   Cervical / Trunk Assessment Cervical / Trunk Assessment: Normal   Communication Communication Communication: Prefers language other than English Public house manager used : [employer] [phone])   Cognition Arousal/Alertness: Awake/alert Behavior During Therapy: WFL for tasks assessed/performed Overall Cognitive Status: Within Functional Limits for tasks assessed                                     General Comments  Patient on RA during session and SpO2 93-95.    Exercises     Shoulder Instructions      Home Living Family/patient expects  to be discharged to:: Private residence Living Arrangements: Children;Spouse/significant other;Other relatives Available Help at Discharge: Family;Available 24 hours/day Type of Home: House Home Access: Stairs to enter [employer] of Steps: 7 Entrance Stairs-Rails: Right;Left;Can reach both Home Layout: One level     Bathroom Shower/Tub: Teacher, early years/pre: Standard Bathroom Accessibility: Yes    Home Equipment: None          Prior Functioning/Environment Level of Independence: Independent                 OT Problem List: Decreased activity tolerance;Cardiopulmonary status limiting activity      OT Treatment/Interventions: Self-care/ADL training;Therapeutic exercise;Therapeutic activities;Patient/family education    OT Goals(Current goals can be found in the care plan section) Acute Rehab OT Goals Patient Stated Goal: to breathe better, go home OT Goal Formulation: With patient Time For Goal Achievement: 12/04/19 Potential to Achieve Goals: Good  OT Frequency: Min 2X/week   Barriers to D/C:            Co-evaluation              AM-PAC OT "6 Clicks" Daily Activity     Outcome Measure Help from another person eating meals?: None Help from another person taking care of personal grooming?: A Little Help from another person toileting, which includes using toliet, bedpan, or urinal?: A Little Help from another person bathing (including washing, rinsing, drying)?: A Little Help from another person to put on and taking off regular upper body clothing?: A Little Help from another person to put on and taking off regular lower body clothing?: A Little 6 Click Score: 19   End of Session Nurse Communication: Mobility status  Activity Tolerance: Patient tolerated treatment well Patient left: in chair;with call bell/phone within reach;Other (comment) (Speech therapist in room)  OT Visit Diagnosis: Pain;Other (comment) (decreased activity tolerance) Pain - part of body:  (throat)                Time: 8832-5498 OT Time Calculation (min): 34 min Charges:  OT General Charges $OT Visit: 1 Visit OT Evaluation $OT Eval Moderate Complexity: 1 Mod OT Treatments $Self Care/Home Management : 8-22 mins  August Luz, OTR/L   Phylliss Bob 11/20/2019, 3:14 PM

## 2019-11-20 NOTE — Progress Notes (Signed)
Family Medicine Teaching Service Daily Progress Note Intern Pager: [phone]  Patient name: Jamie Mitchell Medical record number: [id number] Date of birth: 12-07-1945 Age: 74 y.o. Gender: female  Primary Care Provider: Charlott Rakes, MD Consultants: ENT Code Status: Full  Pt Overview and Major Events to Date:  6/25 - Admit for worsened   Assessment and Plan: Jamie Mitchell that I think is a 74 year old woman admitted for worsening glottal/slight glossal swelling.  She has a previous medical history significant for chronic subglottal swelling of unknown etiology, hypothyroidism, A. fib (on amiodarone), hypertension, Hx of MCA stroke.  Glottic edema with stridor Denies shortness of breath and has no audible stridor, this morning.  Continue steroids and antibiotics per ENT recommendations.  Will consult with Dr. Lucia Mitchell for additional recommendations. Previously recommended direct laryngoscopy under general anesthesia.   Consulting allergy as the recurrence of sx possibly immune mediated.  Consider amiodarone hypersensitivity.   - ENT consulted, appreciate recommendations  - Consider consulting allergy / immunology  - Upon discharge with EPI pen  - Continue Cefepime (6/26 - )  - Continue SoluMedrol (6/25, 6/27 - )  - Racemic epi nebulizer Q4H PRN  - Monitor respiratory status    Dysphagia Continues to have difficulty swallowing.  - SLP recommendation: Dysphagia 3 diet  -Diet order placed  Atrial fibrillation-stable -Continue home Eliquis and Amiodarone    GERD -Protonix IV  Hypothyroidism - Restart home Levothyroxine  - Discontinue IV levothyroxine    History of MCA stroke -Continue home Eliquis   Language barrier Ms. Jamie Mitchell is a Spanish speaker and her son also prefers Spanish. -[employer] interpreter tablet and live interpreters are available  FEN/GI: Dysphagia 3 diet  PPx: Apixaban  Disposition: likely home today   Subjective:  Patient doing well this  morning. Denies shortness of breath.  Feels "fine".   Objective: Temp:  [98.5 F (36.9 C)-98.7 F (37.1 C)] 98.5 F (36.9 C) (06/27 2048) Pulse Rate:  [50-71] 50 (06/27 2048) Resp:  [14-20] 14 (06/27 2048) BP: (116-118)/(60-70) 116/68 (06/27 2048) SpO2:  [93 %-96 %] 93 % (06/27 2048) Weight:  [66.2 kg] 66.2 kg (06/27 2200)  Physical Exam: GEN: pleasant female, in no acute distress  CV: regular rate and rhythm, no murmurs appreciated  RESP: no increased work of breathing, clear to ascultation bilaterally, no stridor ABD: Bowel sounds present. Soft, Nontender, Nondistended. MSK: no lower extremity edema SKIN: warm, dry NEURO: grossly normal, moves all extremities appropriately PSYCH: Normal affect, appropriate speech and behavior      Laboratory: Recent Labs  Lab 11/17/19 1725 11/19/19 0426  WBC 9.2 13.3*  HGB 15.1* 12.5  HCT 46.9* 38.0  PLT 255 226   Recent Labs  Lab 11/17/19 1725 11/18/19 0334  NA 138 138  K 4.1 4.3  CL 106 102  CO2 20* 23  BUN 9 10  CREATININE 0.97 1.01*  CALCIUM 8.8* 8.6*  PROT  --  6.7  BILITOT  --  0.8  ALKPHOS  --  68  ALT  --  29  AST  --  21  GLUCOSE 147* 209*    No results found.   Jamie Hensen, DO 11/20/2019, 8:24 AM PGY-1, [place] Intern pager: [phone], text pages welcome

## 2019-11-21 ENCOUNTER — Ambulatory Visit: Payer: Medicare Other | Admitting: Speech Pathology

## 2019-11-21 ENCOUNTER — Telehealth: Payer: Self-pay

## 2019-11-21 LAB — C3 COMPLEMENT: C3 Complement: 116 mg/dL (ref 82–167)

## 2019-11-21 LAB — C4 COMPLEMENT: Complement C4, Body Fluid: 27 mg/dL (ref 12–38)

## 2019-11-21 LAB — C1 ESTERASE INHIBITOR: C1INH SerPl-mCnc: 37 mg/dL (ref 21–39)

## 2019-11-21 MED FILL — EPINEPHRINE 0.3 MG AUTO-INJ: 0.3 | 2 days supply | Qty: 2 | Fill #0

## 2019-11-21 MED FILL — FAMOTIDINE 20 MG TABS: 20 | 30 days supply | Qty: 60 | Fill #0

## 2019-11-21 MED FILL — AMOXICILLIN 875 MG TABS: 875 | 5 days supply | Qty: 5 | Fill #0

## 2019-11-21 MED FILL — predniSONE 10 MG TABS: 10 | 20 days supply | Qty: 38 | Fill #0

## 2019-11-21 NOTE — Telephone Encounter (Signed)
From discharge call.  She has an appointment with D Newlin 01/01/2020 and did not want to schedule anything sooner.    Her daughter in law, Rosemarie Ax said that the patient is doing " fine."  No questions at this time.  They have the discharge instructions and all medications and did not want to review the medication list.   family providing needed support

## 2019-11-21 NOTE — Telephone Encounter (Signed)
Transition Care Management Follow-up Telephone Call  Call completed with daughter in law, Jamie Mitchell - DPR on file to speak with her.  Spanish interpreter # [phone] with [employer] interpreters assisted  Date of discharge and from where: 11/20/2019, [facility]   How have you been since you were released from the hospital? Jamie Mitchell said that the patient is doing " fine."  Any questions or concerns?none at this time  Items Reviewed:  Did the pt receive and understand the discharge instructions provided? yes, they have the instructions and did not have any questions.  Medications obtained and verified? Jamie Mitchell said that they have all medications, including the new ones and did not need to review the medication list/instructions. They did not have any questions about the med regime.   Any new allergies since your discharge?  none reported   Do you have support at home?  yes, lives with family  Functional Questionnaire: (I = Independent and D = Dependent) ADLs: family provides any needed support. No DME at this time.  No home health ordered,   Follow up appointments reviewed:   PCP Hospital f/u appt confirmed?   Dr Margarita Rana- 01/01/2020, they did not want to schedule anything sooner.   [facility] f/u appt confirmed?allergy - 12/04/2019, neurology - 12/21/2019  Are transportation arrangements needed?  no  If their condition worsens, is the pt aware to call PCP or go to the Emergency Dept.? yes  Was the patient provided with contact information for the PCP's office or ED? they have the phone number for the clinic  Was to pt encouraged to call back with questions or concerns?  yes

## 2019-11-22 LAB — TRYPTASE: Tryptase: 4 ug/L (ref 2.2–13.2)

## 2019-11-23 ENCOUNTER — Other Ambulatory Visit: Payer: Self-pay | Admitting: *Deleted

## 2019-11-23 NOTE — Patient Outreach (Addendum)
[facility]) Care Management  [place]  11/23/2019   Jamie Mitchell 1945/10/06 [id number]  Subjective: Telephone call to patient's designated party release/ son [name]), spoke with son, stated patient's name, date of birth, and address.  States patient is doing good right now,  discussed recent hospitalizations, and providers are still not sure of the origin of patient's recent throat issues.  States the throat issues are the priority, has completed physical and occupational therapy post stroke.  States outpatient speech therapy  is currently on hold due to the patient's  throat  Issues and will resume once issues are resolved or stabilized.     States patient is talking more and speech continuing to improve.  States patient's pacemaker placement has also been placed on hold due to recent hospitalizations/ throat issues.   Son states patient has had the following provider appointments : with ENT (Ear Nose Throat ) MD on 11/13/2019, with cardiologist on 11/15/2019, with primary MD on 11/15/2019, and all appointments went okay.  States patient has the following scheduled appointments: with Allergist MD on  12/04/2019 for throat issue evaluation, on  12/21/2019 with neurologist, and with primary MD on 01/01/2020.   States outcome of Allergist MD appointment will determine next steps and if patient will need additional follow up with ENT.    States he is not sure if patient or his wife has received the Aphasia(EMMI handout) information sent by this RNCM,  will verify with his wife receipt status, and notify this RNCM at next patient outreach follow up call, if he has any questions.  Son states patient  does not have any transportation, Data processing manager, or pharmacy needs at this time. States he is very appreciative of the follow up and is in agreement to continue to receive [place] Management information / services on patient's behalf.     Objective: Per KPN (Knowledge  Performance Now, point of care tool) and chart review,patient hospitalized 11/17/2019 - 11/20/2019 for shortness of breath,  Acute respiratory failure, Glottic edema with stridor.     Patient hospitalized 10/30/2019 -11/01/2019 for shortness of breath.    Patient hospitalized 07/25/2019 - 08/03/2019 forLeft middle cerebral artery strokerehab. Patient hospitalized 07/19/2019 - 11/29/2421 for Embolic stroke involving left middle cerebral artery, status post tPA,mechanical thrombectomy, due toAtrial fibrillation. Patient also has a history of Hypothyroidism,Hypertension,Prediabetes,Aphasia as late effect of cerebrovascular accident, and Dysphagia.   Encounter Medications:  Outpatient Encounter Medications as of 11/23/2019  Medication Sig  . albuterol (VENTOLIN HFA) 108 (90 Base) MCG/ACT inhaler Inhale 1-2 puffs into the lungs every 6 (six) hours as needed for wheezing or shortness of breath.  [name] amiodarone (PACERONE) 200 MG tablet Take 1 tablet (200 mg total) by mouth daily.  [name] amoxicillin (AMOXIL) 875 MG tablet Take 1 tablet (875 mg total) by mouth at bedtime for 5 days.  [name] apixaban (ELIQUIS) 5 MG TABS tablet Take 1 tablet (5 mg total) by mouth 2 (two) times daily.  [name] atorvastatin (LIPITOR) 40 MG tablet Take 1 tablet (40 mg total) by mouth daily. (Patient taking differently: 40 mg every evening. )  . cetirizine (ZYRTEC ALLERGY) 10 MG tablet Take 1 tablet (10 mg total) by mouth daily.  . cetirizine (ZYRTEC ALLERGY) 10 MG tablet Take 1 tablet (10 mg total) by mouth 2 (two) times daily.  . diclofenac Sodium (VOLTAREN) 1 % GEL Apply 2 g topically 4 (four) times daily.  [name] EPINEPHrine 0.3 mg/0.3 mL IJ SOAJ injection Inject 0.3 mLs (  0.3 mg total) into the muscle as needed for anaphylaxis.  . famotidine (PEPCID) 20 MG tablet Take 1 tablet (20 mg total) by mouth 2 (two) times daily.  [name] levothyroxine (SYNTHROID) 88 MCG tablet Take 1 tablet (88 mcg total) by mouth daily before breakfast.  . omeprazole (PRILOSEC)  40 MG capsule Take 40 mg by mouth daily.  . predniSONE (DELTASONE) 10 MG tablet Take 4 tablets (40 mg total) by mouth daily for 5 days, THEN 2 tablets (20 mg total) daily for 5 days, THEN 1 tablet (10 mg total) daily for 5 days, THEN 0.5 tablets (5 mg total) daily for 5 days.   No facility-administered encounter medications on file as of 11/23/2019.    Functional Status:  In your present state of health, do you have any difficulty performing the following activities: 11/18/2019 10/30/2019  Hearing? N N  Vision? N N  Difficulty concentrating or making decisions? N N  Walking or climbing stairs? N N  Dressing or bathing? N N  Doing errands, shopping? N -  Preparing Food and eating ? - -  Using the Toilet? - -  In the past six months, have you accidently leaked urine? - -  Do you have problems with loss of bowel control? - -  Managing your Medications? - -  Managing your Finances? - -  Housekeeping or managing your Housekeeping? - -  Some recent data might be hidden    Fall/Depression Screening: Fall Risk  09/26/2019 08/11/2019 08/24/2016  Falls in the past year? 1 0 No  Number falls in past yr: 1 - -  Injury with Fall? 0 - -  Risk for fall due to : History of fall(s) - -  Follow up Falls evaluation completed - -   PHQ 2/9 Scores 11/15/2019 09/06/2019 09/04/2019 02/26/2017 08/24/2016 02/20/2016 02/20/2016  PHQ - 2 Score 0 0 0 0 0 0 0  PHQ- 9 Score - - 0 0 0 0 -  Exception Documentation - - - - - - -    Assessment: Received [facility] Liaison referral on 08/03/2019. Referral source: Natividad Brood. Referral reason: This was a hospital referral from the nutritionist/patient on [employer] family needed teaching and support -Has home health; [facility] is TOC.  Please assign to [place] Coordinator for complex care and disease management follow up calls, patient is aphasic and speaks/understands Spanish, her son is the contact person and speaks fluent [place], his  name is [name], (Son) [phone] and assess for further needs. Patient was in the rehab center.Screening follow up completed,hasreferredto Education officer, museum for [employer] follow up, telephone assessment pending,andRNCM will continue to follow for care management needs.   Goals Addressed            This Visit's Progress   . Patient will not have any hospitalzations within the next 30 days.   On track    [place] (see longitudinal plan of care for additional care plan information)  Current Barriers:  [name] Knowledge Deficits related to stroke and  glottic edema.  Nurse Case Manager Clinical Goal(s):  [name] Over the next 31 days, patient will not experience hospital admission. Hospital Admissions in last 6 months = 4 . Over the next 30 days, patient will attend all scheduled medical appointments:    Interventions:  . Inter-disciplinary care team collaboration (see longitudinal plan of care) . Provided patient with handout educational materials related to aphasia. . Reviewed scheduled/upcoming provider appointments including: primary  MD, allergist, cardiologist, and neurologist appointments.     Patient Self Care Activities:  . Attends all scheduled provider appointments . Per patient's son, patient has completed outpatient physical and occupational therapy. . Patient has aphasia, speech therapy currently on hold due to glottic edema evaluation and treatment.  Initial goal documentation       Plan: RNCM will discuss patient's case at Multidisciplinary Case Discussion on 11/30/2019 and follow up per recommendations.   RNCM will call patient's son/ designated party releasefor telephone outreach attempt, within 30business days,assessment follow up. RNCM has sent resource letter to patient's son with the following education material per his request:Aphasia(EMMI handout) and with verify receipt status on next patient outreach, within 30 business  days.      Brok Stock[employer] H. Annia Friendly, BSN, [place] [facility] Telephonic CM Phone: [phone] Fax: [phone]

## 2019-11-24 ENCOUNTER — Ambulatory Visit: Payer: Medicare Other

## 2019-11-28 ENCOUNTER — Ambulatory Visit: Payer: Medicare Other | Attending: Family Medicine | Admitting: Family Medicine

## 2019-11-28 ENCOUNTER — Other Ambulatory Visit: Payer: Self-pay

## 2019-11-28 ENCOUNTER — Encounter: Payer: Self-pay | Admitting: Family Medicine

## 2019-11-28 VITALS — BP 167/94 | HR 69 | Ht 59.0 in | Wt 144.2 lb

## 2019-11-28 DIAGNOSIS — Z791 Long term (current) use of non-steroidal anti-inflammatories (NSAID): Secondary | ICD-10-CM | POA: Insufficient documentation

## 2019-11-28 DIAGNOSIS — I69320 Aphasia following cerebral infarction: Secondary | ICD-10-CM | POA: Diagnosis not present

## 2019-11-28 DIAGNOSIS — E099 Drug or chemical induced diabetes mellitus without complications: Secondary | ICD-10-CM | POA: Diagnosis not present

## 2019-11-28 DIAGNOSIS — E038 Other specified hypothyroidism: Secondary | ICD-10-CM

## 2019-11-28 DIAGNOSIS — Z1231 Encounter for screening mammogram for malignant neoplasm of breast: Secondary | ICD-10-CM

## 2019-11-28 DIAGNOSIS — Z7952 Long term (current) use of systemic steroids: Secondary | ICD-10-CM | POA: Diagnosis not present

## 2019-11-28 DIAGNOSIS — I10 Essential (primary) hypertension: Secondary | ICD-10-CM

## 2019-11-28 DIAGNOSIS — Z7901 Long term (current) use of anticoagulants: Secondary | ICD-10-CM | POA: Diagnosis not present

## 2019-11-28 DIAGNOSIS — J029 Acute pharyngitis, unspecified: Secondary | ICD-10-CM | POA: Insufficient documentation

## 2019-11-28 DIAGNOSIS — Z79890 Hormone replacement therapy (postmenopausal): Secondary | ICD-10-CM | POA: Diagnosis not present

## 2019-11-28 DIAGNOSIS — T380X5A Adverse effect of glucocorticoids and synthetic analogues, initial encounter: Secondary | ICD-10-CM | POA: Diagnosis not present

## 2019-11-28 DIAGNOSIS — I63412 Cerebral infarction due to embolism of left middle cerebral artery: Secondary | ICD-10-CM

## 2019-11-28 DIAGNOSIS — Z79899 Other long term (current) drug therapy: Secondary | ICD-10-CM | POA: Diagnosis not present

## 2019-11-28 DIAGNOSIS — J384 Edema of larynx: Secondary | ICD-10-CM

## 2019-11-28 DIAGNOSIS — I4891 Unspecified atrial fibrillation: Secondary | ICD-10-CM | POA: Diagnosis not present

## 2019-11-28 DIAGNOSIS — Z1211 Encounter for screening for malignant neoplasm of colon: Secondary | ICD-10-CM

## 2019-11-28 LAB — POCT GLYCOSYLATED HEMOGLOBIN (HGB A1C): HbA1c, POC (controlled diabetic range): 6.6 % (ref 0.0–7.0)

## 2019-11-28 MED ORDER — HYDROCOD POLST-CPM POLST ER 10-8 MG/5ML PO SUER: 5.0000 mL | Freq: Two times a day (BID) | ORAL | 0 refills | Status: DC | PRN

## 2019-11-28 MED ORDER — ALBUTEROL SULFATE HFA 108 (90 BASE) MCG/ACT IN AERS: 1.0000 | INHALATION_SPRAY | Freq: Four times a day (QID) | RESPIRATORY_TRACT | 0 refills | Status: DC | PRN

## 2019-11-28 MED ORDER — METFORMIN HCL 500 MG PO TABS: 500.0000 mg | ORAL_TABLET | Freq: Every day | ORAL | 6 refills | Status: DC

## 2019-11-28 NOTE — Progress Notes (Signed)
Patient is having burning in throat on left side.

## 2019-11-28 NOTE — Patient Instructions (Signed)
O diabetes mellitus e a nutrio, adultos Diabetes Mellitus and Nutrition, Adult Quando voc sofre de diabetes (diabetes mellitus),  muito importante ter hbitos de alimentao saudveis, uma vez que os seus nveis de acar no sangue (glicose) so significativamente afetados pelo que voc come e bebe. Comer alimentos saudveis nas quantidades apropriadas e aproximadamente nos mesmos horrios todos os dias pode ajudar voc a:  Controlar seu nvel de glicose sangunea.  Reduzir seu risco de doena cardaca.  Melhorar sua presso arterial.  Alcanar ou manter um peso saudvel. Todas as pessoas com diabetes so diferentes, e cada pessoa tem diferentes necessidades em termos de um plano de refeies. Seu mdico poder recomendar que voc colabore com um especialista em nutrio e dieta (nutricionista) para elaborar o melhor plano de refeies para voc. Seu plano de refeies poder variar dependendo de fatores como:  As calorias de que voc necessita.  Os medicamentos que toma.  Seu peso.  Seus nveis de glicose sangunea, presso arterial e colesterol.  Seu nvel de atividade.  Outros quadros clnicos, como doena cardaca ou renal. Como os carboidratos me afetam? Os carboidratos, tambm chamado de acares, afetam o nvel de sua glicose sangunea mais do que qualquer outro tipo de alimento. A ingesto de carboidratos aumenta naturalmente a quantidade de glicose no seu sangue. A contagem de carboidratos  um mtodo para acompanhar a quantidade de carboidratos que voc ingere. A contagem de carboidratos  importante para manter sua glicose sangunea em um nvel saudvel, principalmente se voc usa insulina ou toma certos medicamentos orais para o diabetes.  importante saber quanto de carboidrato voc pode ingerir com segurana em cada refeio. Isso varia de pessoa para pessoa. Seu nutricionista poder ajud-lo a calcular quanto de carboidrato voc deve consumir em cada refeio e em cada  lanche. Alimentos que contm carboidratos incluem:  Po, cereais, arroz, massas e bolachas.  Batatas e milho.  Ervilhas, feijo e lentilhas.  Leite e iogurte.  Frutas e sucos.  Sobremesas, como bolos, biscoitos, sorvete e doces. Como o lcool me afeta? O lcool pode causar uma sbita reduo do nvel de glicose sangunea (hipoglicemia), especialmente se voc usar insulina ou tomar certos medicamentos orais para o diabetes. A hipoglicemia pode ser um quadro clnico potencialmente fatal. Os sintomas de hipoglicemia (sonolncia, vertigem e confuso) so parecidos com os sintomas da ingesto abusiva de lcool. Caso seu mdico diga que o lcool  seguro para voc, siga essas orientaes:  Limite o consumo de bebidas alcolicas a, no mximo, 1 dose por dia para mulheres no grvidas e 2 doses por dia para homens. Uma dose equivale a 12 onas de cerveja, 5 onas de vinho ou 1 ona de bebida destilada.  No beba de estmago vazio.  Mantenha sua hidratao com gua, refrigerante diet ou ch gelado sem acar.  Tenha em mente que o refrigerante normal, suco e outras bebidas podem conter muito acar e devem ser contadas como carboidratos. Quais so as dicas para seguir este plano?  Leia os rtulos dos alimentos  Comece verificando o tamanho da poro nas "Informaes nutricionais" no rtulo de alimentos e bebidas embalados. A quantidade de calorias, carboidratos, gorduras e outros nutrientes listados no rtulo se baseia em uma poro padro do alimento. Muitos itens contm mais de uma poro por embalagem.  Verifique o total de gramas (g) de carboidratos contidos em uma poro. Voc pode calcular o nmero de pores de carboidratos em uma poro dividindo o total de carboidratos por 15. Por exemplo: supondo que um alimento contenha um   total de 30 g de carboidratos, isso seria igual a 2 pores de carboidratos.  Verifique o nmero de gramas (g) de gorduras saturadas e trans em uma poro.  Escolha alimentos com pouca ou nenhuma quantidade dessas gorduras.  Verifique a quantidade de miligramas (mg) de sal (sdio) em uma poro. A maioria das pessoas deve limitar o consumo de sdio a 2.300 mg por dia.  Sempre verifique as informaes nutricionais dos alimentos rotulados como de "baixo teor de gordura" e "gordura zero". Esses alimentos podem conter elevado teor de acar de adio ou carboidratos refinados e devem ser evitados.  Converse com seu nutricionista para identificar suas metas dirias dos nutrientes listados na tabela. No mercado  Evite comprar alimentos enlatados, semiprontos ou processados. Esses alimentos tendem a conter elevado teor de gordura, sdio e acares adicionados.  Escolha itens das reas mais externas da seo de alimentos. Ela inclui frutas e verduras frescas, cereais a granel, carnes frescas e produtos avirios frescos. Na cozinha  Use mtodos de cozimento de baixo calor, como assar, em vez de mtodos de cozimento de alto calor, como fritura por imerso.  Cozinhe com leos saudveis para o corao, como de oliva, canola ou girassol.  Evite cozinhar com manteiga, creme ou carnes com elevado teor de gordura. O planejamento das refeies  Consuma refeies e lanches regularmente, de preferncia nos mesmos horrios todos os dias. Evite ficar longos perodos sem comer.  Coma alimentos ricos em fibra, como frutas e verduras frescas, feijo e gros integrais. Converse com seu nutricionista sobre quantas pores de carboidratos voc pode comer em cada refeio.  Coma de 4-6 onas de protena magra por dia, como carne magra, frango, peixe, ovos ou tofu. Uma ona de protena magra  igual a: ? 1 ona de carne, frango ou peixe. ? 1 ovo. ?  xcara de tofu.  Coma alguns alimentos todos os dias que contenham gorduras saudveis, como abacate, nozes, sementes e peixe. Estilo de vida  Verifique sua glicose sangunea regularmente.  Exercite-se regularmente de  acordo com as indicaes do seu mdico. Isso pode incluir: ? 150 minutos de exerccios de intensidade moderada ou de intensidade vigorosa por semana. Pode ser uma caminhada leve, andar de bicicleta ou fazer hidroginstica. ? Alongar e realizar exerccios de fora, como ioga ou musculao, pelo menos 2 vezes por semana.  Tome medicamentos somente de acordo com as orientaes do seu mdico.  No use nenhum produto que contenha nicotina ou tabaco, como cigarros tradicionais e cigarros eletrnicos. Caso precise de ajuda para parar de fumar, fale com seu mdico.  Consulte-se com um especialista em diabetes para identificar estratgias para lidar com o estresse e quaisquer desafios emocionais ou sociais. Perguntas a fazer ao mdico  Preciso me consultar com um especialista em diabetes?  Preciso me consultar com um nutricionista?  Para que nmero devo ligar se tiver dvidas?  Quais so os melhores horrios para verificar minha glicose sangunea? Onde conseguir mais informaes:  Associao de Diabetes Americana (American Diabetes Association, ADA): diabetes.org  Academia de Nutrio e Diettica (Academy of Nutrition and Dietetics): www.eatright.org  National Institute of Diabetes and Digestive and Kidney Diseases (Instituto Nacional de Diabetes e Doenas Digestivas e Renais): www.niddk.nih.gov Resumo  Um plano de refeies saudveis ajudar voc a controlar sua glicose sangunea e a manter um estilo de vida saudvel.  Consultar um especialista em nutrio (nutricionista) poder ajudar na elaborao do melhor plano para voc.  Tenha em mente que carboidratos (acares) e lcool tm efeitos imediatos sobre seus nveis de glicose   sangunea.  importante contar os carboidratos e consumir lcool com cautela. Estas informaes no se destinam a substituir as recomendaes de seu mdico. No deixe de discutir quaisquer dvidas com seu mdico. Document Revised: 01/19/2017 Document Reviewed:  09/10/2016 Elsevier Patient Education  2020 Elsevier Inc.  

## 2019-11-28 NOTE — Progress Notes (Signed)
Subjective:  Patient ID: Jamie Mitchell, female    DOB: 1946-01-02  Age: 74 y.o. 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  She also developed intermittent atrial fibrillation with RVR for which she was commenced on amiodarone and Eliquis by EP.  Also found to have hypothyroidism and commenced on Levothyroxine.Echo revealed EF of 60 to 65%, mild MR. She has had multiple ED visits for sore throat and subsequently was admitted for acute respiratory distress secondary to subglottic stenosis and edema on 10/30/2019 status post epinephrine in the ED and antibiotic treatment plus steroids and again hospitalized on 11/17/2019. She was discharged on a prescription for EpiPen, prednisone taper.  History is obtained from daughter-in-law  who states the Jamie Ou) patient is able to commmunicate minimally and the presence of burning in left side of her throat. Her Speech therapy has been suspended until she has been evaluated by ENT for her throat symptoms. She has completed PT. ENT evaluation on 11/13/2019 by Dr. Lucia Gaskins indicated the need for direct laryngoscopy under anesthesia and referral to a tertiary center. The patient denies presence of chest pain but does complain of throat pain when she attempts to inhale deeply; is currently adherent with her prednisone taper. Last seen by cardiology in 10/2019 with plans for pacemaker but she is doing well on amiodarone and Eliquis.  A1c today 6.6 which reveals a new diagnosis of diabetes mellitus last A1c was 6.0. Past Medical History:  Diagnosis Date  . Atrial fibrillation ([place])   . 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 Hypertension   . Stroke ([place])   . Thyroid disease     Past  Surgical History:  Procedure Laterality Date  . IR CT HEAD LTD  07/20/2019  . IR PERCUTANEOUS ART THROMBECTOMY/INFUSION INTRACRANIAL INC DIAG ANGIO  07/20/2019  . RADIOLOGY WITH ANESTHESIA N/A 07/20/2019   Procedure: IR WITH ANESTHESIA;  Surgeon: Luanne Bras, MD;  Location: [facility];  Service: Radiology;  Laterality: N/A;    Family History  Family history unknown: Yes    No Known Allergies  Outpatient Medications Prior to Visit  Medication Sig Dispense Refill  . amiodarone (PACERONE) 200 MG tablet Take 1 tablet (200 mg total) by mouth daily. 30 tablet 5  . apixaban (ELIQUIS) 5 MG TABS tablet Take 1 tablet (5 mg total) by mouth 2 (two) times daily. 90 tablet 3  . atorvastatin (LIPITOR) 40 MG tablet Take 1 tablet (40 mg total) by mouth daily. (Patient taking differently: 40 mg every evening. ) 30 tablet 0  . cetirizine (ZYRTEC ALLERGY) 10 MG tablet Take 1 tablet (10 mg total) by mouth daily. 30 tablet 2  . cetirizine (ZYRTEC ALLERGY) 10 MG tablet Take 1 tablet (10 mg total) by mouth 2 (two) times daily. 30 tablet 0  . diclofenac Sodium (VOLTAREN) 1 % GEL Apply 2 g topically 4 (four) times daily. 100 g 3  . EPINEPHrine 0.3 mg/0.3 mL IJ SOAJ injection Inject 0.3 mLs (0.3 mg total) into the muscle as needed for anaphylaxis. 1 each 1  . famotidine (PEPCID) 20 MG tablet Take 1 tablet (20 mg total) by mouth 2 (two) times daily. 60 tablet 1  . levothyroxine (SYNTHROID) 88 MCG tablet Take 1 tablet (88 mcg total) by mouth daily before breakfast. 30 tablet 3  .  omeprazole (PRILOSEC) 40 MG capsule Take 40 mg by mouth daily.    [name] albuterol (VENTOLIN HFA) 108 (90 Base) MCG/ACT inhaler Inhale 1-2 puffs into the lungs every 6 (six) hours as needed for wheezing or shortness of breath. 18 g 0  . predniSONE (DELTASONE) 10 MG tablet Take 4 tablets (40 mg total) by mouth daily for 5 days, THEN 2 tablets (20 mg total) daily for 5 days, THEN 1 tablet (10 mg total) daily for 5 days, THEN 0.5 tablets (5 mg total)  daily for 5 days. (Patient not taking: Reported on 11/28/2019) 20 tablet 0   No facility-administered medications prior to visit.     ROS Review of Systems  Constitutional: Negative for activity change, appetite change and fatigue.  HENT: Positive for voice change. Negative for congestion, sinus pressure and sore throat.   Eyes: Negative for visual disturbance.  Respiratory: Negative for cough, chest tightness, shortness of breath and wheezing.   Cardiovascular: Negative for chest pain and palpitations.  Gastrointestinal: Negative for abdominal distention, abdominal pain and constipation.  Endocrine: Negative for polydipsia.  Genitourinary: Negative for dysuria and frequency.  Musculoskeletal: Negative for arthralgias and back pain.  Skin: Negative for rash.  Neurological: Negative for tremors, light-headedness and numbness.  Hematological: Does not bruise/bleed easily.  Psychiatric/Behavioral: Negative for agitation and behavioral problems.    Objective:  BP (!) 167/94   Pulse 69   Ht 4\' 11"  (1.499 m)   Wt 144 lb 3.2 oz (65.4 kg)   SpO2 97%   BMI 29.12 kg/m   BP/Weight 11/28/2019 11/20/2019 2/33/0076  Systolic BP 226 96 -  Diastolic BP 94 65 -  Wt. (Lbs) 144.2 - 145.94  BMI 29.12 - -      Physical Exam Constitutional:      Appearance: She is well-developed.  Neck:     Vascular: No JVD.  Cardiovascular:     Rate and Rhythm: Normal rate.     Heart sounds: Normal heart sounds. No murmur heard.   Pulmonary:     Effort: Pulmonary effort is normal.     Breath sounds: Stridor (and upper airway sounds in all lung zones) present. No wheezing or rales.     Comments: No evidence of respiratory distress Chest:     Chest wall: No tenderness.  Abdominal:     General: Bowel sounds are normal. There is no distension.     Palpations: Abdomen is soft. There is no mass.     Tenderness: There is no abdominal tenderness.  Musculoskeletal:        General: Normal range of motion.      Right lower leg: No edema.     Left lower leg: No edema.  Neurological:     Mental Status: She is alert and oriented to person, place, and time.     Comments: Expressive aphasia  Psychiatric:        Mood and Affect: Mood normal.     CMP Latest Ref Rng & Units 11/18/2019 11/17/2019 11/01/2019  Glucose 70 - 99 mg/dL 209(H) 147(H) 134(H)  BUN 8 - 23 mg/dL 10 9 14   Creatinine 0.44 - 1.00 mg/dL 1.01(H) 0.97 1.02(H)  Sodium 135 - 145 mmol/L 138 138 139  Potassium 3.5 - 5.1 mmol/L 4.3 4.1 4.3  Chloride 98 - 111 mmol/L 102 106 103  CO2 22 - 32 mmol/L 23 20(L) 27  Calcium 8.9 - 10.3 mg/dL 8.6(L) 8.8(L) 8.6(L)  Total Protein 6.5 - 8.1 g/dL 6.7 - -  Total  Bilirubin 0.3 - 1.2 mg/dL 0.8 - -  Alkaline Phos 38 - 126 U/L 68 - -  AST 15 - 41 U/L 21 - -  ALT 0 - 44 U/L 29 - -    Lipid Panel     Component Value Date/Time   CHOL 113 07/20/2019 0356   CHOL 137 02/26/2017 1035   TRIG 57 07/20/2019 0356   HDL 36 (L) 07/20/2019 0356   HDL 35 (L) 02/26/2017 1035   CHOLHDL 3.1 07/20/2019 0356   VLDL 11 07/20/2019 0356   LDLCALC 66 07/20/2019 0356   LDLCALC 49 02/26/2017 1035   LDLDIRECT 78 02/20/2016 1221    CBC    Component Value Date/Time   WBC 13.3 (H) 11/19/2019 0426   RBC 4.27 11/19/2019 0426   HGB 12.5 11/19/2019 0426   HCT 38.0 11/19/2019 0426   PLT 226 11/19/2019 0426   MCV 89.0 11/19/2019 0426   MCH 29.3 11/19/2019 0426   MCHC 32.9 11/19/2019 0426   RDW 15.9 (H) 11/19/2019 0426   LYMPHSABS 1.7 11/19/2019 0426   MONOABS 0.5 11/19/2019 0426   EOSABS 0.0 11/19/2019 0426   BASOSABS 0.0 11/19/2019 0426    Lab Results  Component Value Date   HGBA1C 6.6 11/28/2019    Lab Results  Component Value Date   TSH 8.960 (H) 09/04/2019    Assessment & Plan:  1. Aphasia as late effect of cerebrovascular accident (CVA) With associated throat pain and subglottic edema Speech therapy on hold at this time until ENT evaluation  2. Encounter for screening mammogram for malignant  neoplasm of breast - MM DIGITAL SCREENING BILATERAL; Future  3. Screening for colon cancer - Cologuard  4. Steroid-induced diabetes mellitus, initial encounter ([place]) New diagnosis with A1c of 6.6; goal is less than 7.0.  A1c was previously 6.0 We will commence Metformin Diabetic education at next visit along with prescription for glucometer - POCT glycosylated hemoglobin (Hb A1C) - metFORMIN (GLUCOPHAGE) 500 MG tablet; Take 1 tablet (500 mg total) by mouth daily with breakfast.  Dispense: 30 tablet; Refill: 6  5. Atrial fibrillation with RVR (H[facility]) Currently in sinus rhythm Continue rate control with amiodarone; anticoagulation with Eliquis  6. 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 Subglottic edema Currently on prednisone Uncontrolled with ongoing upper airway sounds We will add on Tussionex Keep appointment with allergy and immunology Referred to [facility] ENT for direct longer scope beyond anesthesia She does have a prescription for EpiPen and also knows to go to the ED with worsening symptoms - Ambulatory referral to ENT - chlorpheniramine-HYDROcodone (TUSSIONEX PENNKINETIC ER) 10-8 MG/5ML SUER; Take 5 mLs by mouth every 12 (twelve) hours as needed for cough.  Dispense: 140 mL; Refill: 0  8. Other specified hypothyroidism Uncontrolled with elevated TSH in 08/2019 We will repeat and adjust regimen accordingly - TSH - T4, free  9. Essential hypertension Elevated blood pressure but surprisingly at discharge she was hypotensive with a blood pressure of 96/65 We will see back in 1 month and assess blood pressure and add antihypertensive if indicated    Health Care Maintenance: Referred for Cologuard and mammogram today. Meds ordered this encounter  Medications  . metFORMIN (GLUCOPHAGE) 500 MG tablet    Sig: Take 1 tablet (500 mg total) by mouth daily  with breakfast.    Dispense:  30 tablet    Refill:  6  . chlorpheniramine-HYDROcodone (TUSSIONEX  PENNKINETIC ER) 10-8 MG/5ML SUER    Sig: Take 5 mLs by mouth every 12 (twelve) hours as needed for cough.    Dispense:  140 mL    Refill:  0  . albuterol (VENTOLIN HFA) 108 (90 Base) MCG/ACT inhaler    Sig: Inhale 1-2 puffs into the lungs every 6 (six) hours as needed for wheezing or shortness of breath.    Dispense:  18 g    Refill:  0    Follow-up: Return in about 1 month (around 12/29/2019), or if symptoms worsen or fail to improve, for BP follow up and coordination of care.       Charlott Rakes, MD, FAAFP. [facility] [place], [place]   11/28/2019, 1:14 PM

## 2019-11-29 ENCOUNTER — Other Ambulatory Visit: Payer: Self-pay | Admitting: Family Medicine

## 2019-11-29 LAB — T4, FREE: Free T4: 1.92 ng/dL — ABNORMAL HIGH (ref 0.82–1.77)

## 2019-11-29 LAB — TSH: TSH: 2.51 u[IU]/mL (ref 0.450–4.500)

## 2019-11-29 MED ORDER — LEVOTHYROXINE SODIUM 88 MCG PO TABS: 88.0000 ug | ORAL_TABLET | Freq: Every day | ORAL | 3 refills | Status: DC

## 2019-11-30 ENCOUNTER — Other Ambulatory Visit: Payer: Self-pay | Admitting: *Deleted

## 2019-11-30 NOTE — Patient Outreach (Signed)
[facility]) Care Management  11/30/2019  [place] Stites [birth year] [id number]   Patient's case discussed at  Multidisciplinary Case Discussion (MCD) with [facility] Care Management Team.  Discussion recommendations: Next steps will be determined by patient's allergist appointment on 12/04/2019.  Plan: Update team as needed.     Jewell Ryans H. Annia Friendly, BSN, [place] Management [facility] Telephonic CM Phone: [phone] Fax: [phone]

## 2019-12-02 ENCOUNTER — Observation Stay (HOSPITAL_COMMUNITY): Payer: Medicare Other

## 2019-12-02 ENCOUNTER — Emergency Department (HOSPITAL_COMMUNITY): Payer: Medicare Other

## 2019-12-02 ENCOUNTER — Inpatient Hospital Stay (HOSPITAL_COMMUNITY)
Admission: EM | Admit: 2019-12-02 | Discharge: 2019-12-04 | DRG: 915 | Disposition: A | Discharge status: Home / Self Care | Payer: Medicare Other | Attending: Family Medicine | Admitting: Family Medicine

## 2019-12-02 ENCOUNTER — Other Ambulatory Visit: Payer: Self-pay

## 2019-12-02 ENCOUNTER — Encounter (HOSPITAL_COMMUNITY): Payer: Self-pay | Admitting: Family Medicine

## 2019-12-02 DIAGNOSIS — J384 Edema of larynx: Secondary | ICD-10-CM | POA: Diagnosis present

## 2019-12-02 DIAGNOSIS — J9601 Acute respiratory failure with hypoxia: Secondary | ICD-10-CM | POA: Diagnosis not present

## 2019-12-02 DIAGNOSIS — I480 Paroxysmal atrial fibrillation: Secondary | ICD-10-CM | POA: Diagnosis not present

## 2019-12-02 DIAGNOSIS — I10 Essential (primary) hypertension: Secondary | ICD-10-CM | POA: Diagnosis present

## 2019-12-02 DIAGNOSIS — E1165 Type 2 diabetes mellitus with hyperglycemia: Secondary | ICD-10-CM | POA: Diagnosis present

## 2019-12-02 DIAGNOSIS — Z79899 Other long term (current) drug therapy: Secondary | ICD-10-CM

## 2019-12-02 DIAGNOSIS — I700 Atherosclerosis of aorta: Secondary | ICD-10-CM | POA: Diagnosis not present

## 2019-12-02 DIAGNOSIS — Z20822 Contact with and (suspected) exposure to covid-19: Secondary | ICD-10-CM | POA: Diagnosis present

## 2019-12-02 DIAGNOSIS — I69320 Aphasia following cerebral infarction: Secondary | ICD-10-CM

## 2019-12-02 DIAGNOSIS — T783XXA Angioneurotic edema, initial encounter: Secondary | ICD-10-CM | POA: Diagnosis not present

## 2019-12-02 DIAGNOSIS — R0603 Acute respiratory distress: Secondary | ICD-10-CM | POA: Diagnosis not present

## 2019-12-02 DIAGNOSIS — J9811 Atelectasis: Secondary | ICD-10-CM | POA: Diagnosis not present

## 2019-12-02 DIAGNOSIS — K219 Gastro-esophageal reflux disease without esophagitis: Secondary | ICD-10-CM | POA: Diagnosis present

## 2019-12-02 DIAGNOSIS — Z7984 Long term (current) use of oral hypoglycemic drugs: Secondary | ICD-10-CM

## 2019-12-02 DIAGNOSIS — Z7901 Long term (current) use of anticoagulants: Secondary | ICD-10-CM

## 2019-12-02 DIAGNOSIS — Z79890 Hormone replacement therapy (postmenopausal): Secondary | ICD-10-CM

## 2019-12-02 DIAGNOSIS — E785 Hyperlipidemia, unspecified: Secondary | ICD-10-CM | POA: Diagnosis present

## 2019-12-02 DIAGNOSIS — R569 Unspecified convulsions: Secondary | ICD-10-CM | POA: Diagnosis not present

## 2019-12-02 DIAGNOSIS — I517 Cardiomegaly: Secondary | ICD-10-CM | POA: Diagnosis not present

## 2019-12-02 DIAGNOSIS — E039 Hypothyroidism, unspecified: Secondary | ICD-10-CM | POA: Diagnosis present

## 2019-12-02 LAB — BASIC METABOLIC PANEL
Anion gap: 12 (ref 5–15)
BUN: 20 mg/dL (ref 8–23)
CO2: 28 mmol/L (ref 22–32)
Calcium: 8.7 mg/dL — ABNORMAL LOW (ref 8.9–10.3)
Chloride: 99 mmol/L (ref 98–111)
Creatinine, Ser: 0.94 mg/dL (ref 0.44–1.00)
GFR calc Af Amer: >60 mL/min (ref >60)
GFR calc non Af Amer: >60 mL/min (ref >60)
Glucose, Bld: 166 mg/dL — ABNORMAL HIGH (ref 70–99)
Potassium: 4 mmol/L (ref 3.5–5.1)
Sodium: 139 mmol/L (ref 135–145)

## 2019-12-02 LAB — CBC WITH DIFFERENTIAL/PLATELET
Abs Immature Granulocytes: 0.35 10*3/uL — ABNORMAL HIGH (ref 0.00–0.07)
Basophils Absolute: 0 10*3/uL (ref 0.0–0.1)
Basophils Relative: 0 %
Eosinophils Absolute: 0 10*3/uL (ref 0.0–0.5)
Eosinophils Relative: 0 %
HCT: 43.4 % (ref 36.0–46.0)
Hemoglobin: 13.8 g/dL (ref 12.0–15.0)
Immature Granulocytes: 3 %
Lymphocytes Relative: 23 %
Lymphs Abs: 2.8 10*3/uL (ref 0.7–4.0)
MCH: 28.5 pg (ref 26.0–34.0)
MCHC: 31.8 g/dL (ref 30.0–36.0)
MCV: 89.7 fL (ref 80.0–100.0)
Monocytes Absolute: 0.7 10*3/uL (ref 0.1–1.0)
Monocytes Relative: 6 %
Neutro Abs: 8.5 10*3/uL — ABNORMAL HIGH (ref 1.7–7.7)
Neutrophils Relative %: 68 %
Platelets: 281 10*3/uL (ref 150–400)
RBC: 4.84 MIL/uL (ref 3.87–5.11)
RDW: 16.2 % — ABNORMAL HIGH (ref 11.5–15.5)
WBC: 12.4 10*3/uL — ABNORMAL HIGH (ref 4.0–10.5)
nRBC: 0 % (ref 0.0–0.2)

## 2019-12-02 LAB — I-STAT ARTERIAL BLOOD GAS, ED
Acid-Base Excess: 7 mmol/L — ABNORMAL HIGH (ref 0.0–2.0)
Bicarbonate: 32.8 mmol/L — ABNORMAL HIGH (ref 20.0–28.0)
Calcium, Ion: 1.15 mmol/L (ref 1.15–1.40)
HCT: 42 % (ref 36.0–46.0)
Hemoglobin: 14.3 g/dL (ref 12.0–15.0)
O2 Saturation: 100 %
Potassium: 3.3 mmol/L — ABNORMAL LOW (ref 3.5–5.1)
Sodium: 136 mmol/L (ref 135–145)
TCO2: 34 mmol/L — ABNORMAL HIGH (ref 22–32)
pCO2 arterial: 51.7 mmHg — ABNORMAL HIGH (ref 32.0–48.0)
pH, Arterial: 7.41 (ref 7.350–7.450)
pO2, Arterial: 461 mmHg — ABNORMAL HIGH (ref 83.0–108.0)

## 2019-12-02 LAB — MRSA PCR SCREENING: MRSA by PCR: NEGATIVE

## 2019-12-02 LAB — TROPONIN I (HIGH SENSITIVITY)
Troponin I (High Sensitivity): 11 ng/L (ref <18)
Troponin I (High Sensitivity): 49 ng/L — ABNORMAL HIGH (ref <18)
Troponin I (High Sensitivity): 128 ng/L (ref <18)
Troponin I (High Sensitivity): 191 ng/L (ref <18)
Troponin I (High Sensitivity): 172 ng/L (ref <18)

## 2019-12-02 LAB — GLUCOSE, CAPILLARY
Glucose-Capillary: 181 mg/dL — ABNORMAL HIGH (ref 70–99)
Glucose-Capillary: 231 mg/dL — ABNORMAL HIGH (ref 70–99)

## 2019-12-02 LAB — LACTIC ACID, PLASMA
Lactic Acid, Venous: 1.4 mmol/L (ref 0.5–1.9)
Lactic Acid, Venous: 2.3 mmol/L (ref 0.5–1.9)

## 2019-12-02 LAB — BRAIN NATRIURETIC PEPTIDE: B Natriuretic Peptide: 92.1 pg/mL (ref 0.0–100.0)

## 2019-12-02 LAB — HEMOGLOBIN A1C
Hgb A1c MFr Bld: 7.1 % — ABNORMAL HIGH (ref 4.8–5.6)
Mean Plasma Glucose: 157.07 mg/dL

## 2019-12-02 LAB — SARS CORONAVIRUS 2 BY RT PCR (HOSPITAL ORDER, PERFORMED IN ~~LOC~~ HOSPITAL LAB): SARS Coronavirus 2: NEGATIVE

## 2019-12-02 IMAGING — CT CT NECK W/ CM
4 of 6 series · 14 of 33 positions shown, 16 images · IV contrast (omnipaque)
Comparison: [DATE]

CLINICAL DATA: Shortness of breath.  Glottic edema.  Stridor.

EXAM:
CT NECK WITH CONTRAST
TECHNIQUE: Multidetector CT imaging of the neck was performed using the
standard protocol following the bolus administration of intravenous
contrast.
CONTRAST:  75 cc Omnipaque 300

[Series 3: neck 2.0 i31s 3 · axial · 0.47mm/px · z∈[+1059,+1167]mm · 3 of 108 slices shown, 4 images]
[im 27/108  soft-tissue]
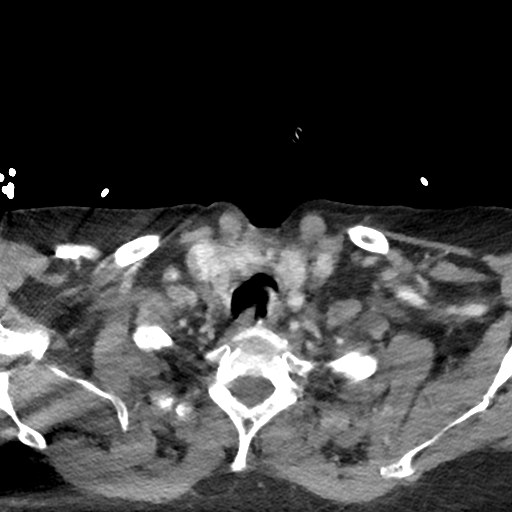
[im 27/108  bone]
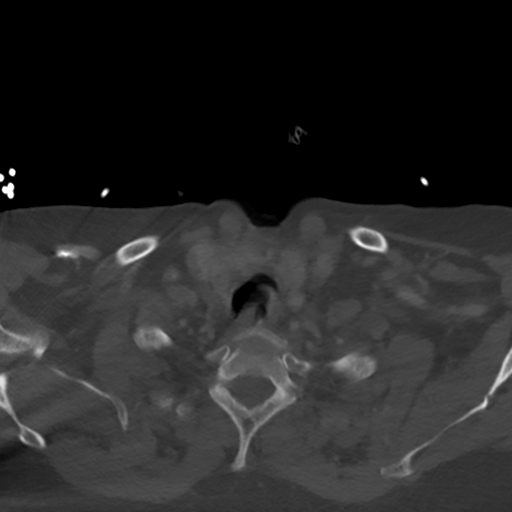
[im 54/108  bone]
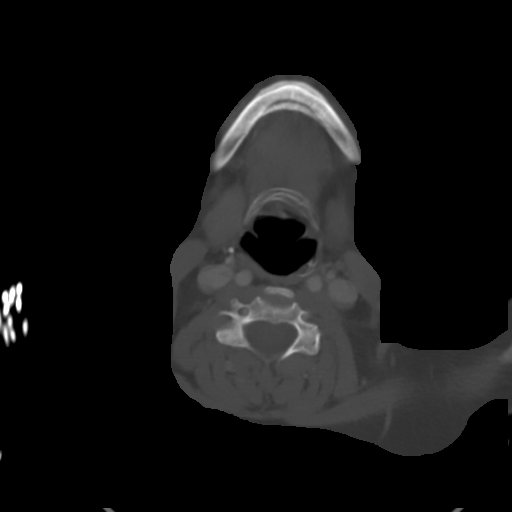
[im 81/108  bone]
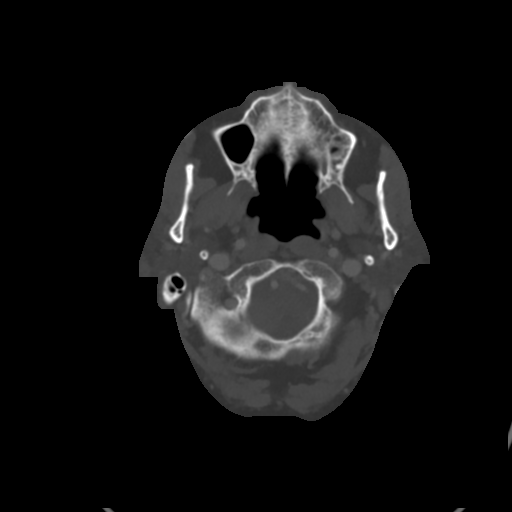

[Series 6: coronal st · coronal · 0.35mm/px · 3 of 109 slices shown]
[im 22/109  bone]
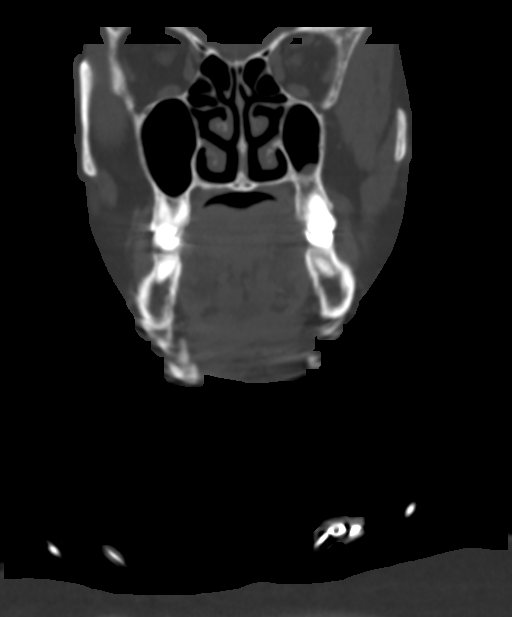
[im 44/109  bone]
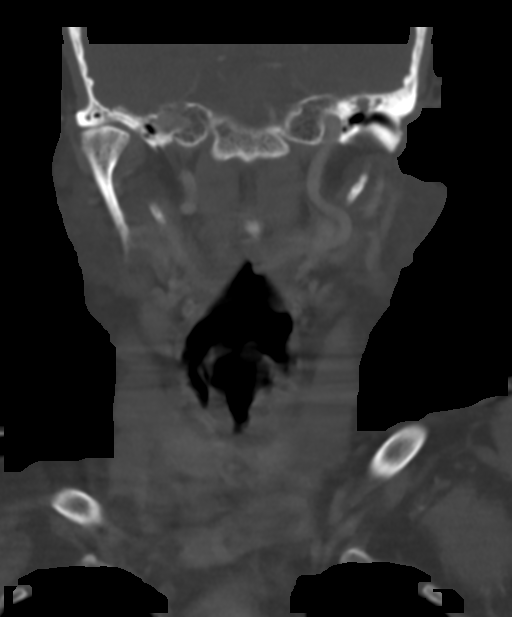
[im 65/109  bone]
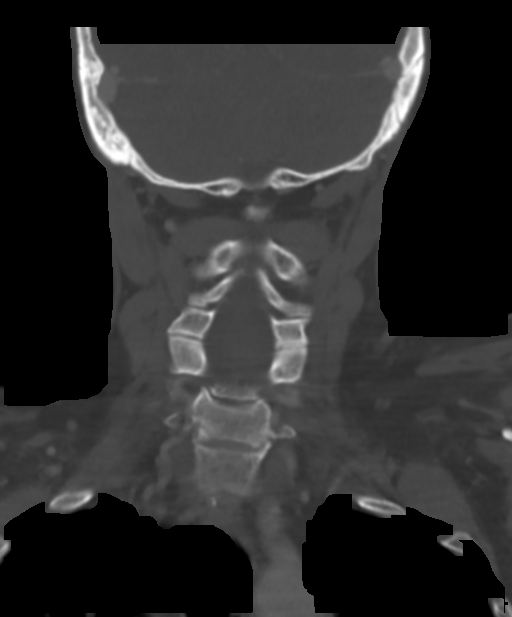

[Series 7: sagittal st · sagittal · 0.42mm/px · 5 of 101 slices shown, 6 images]
[im 34/101  bone]
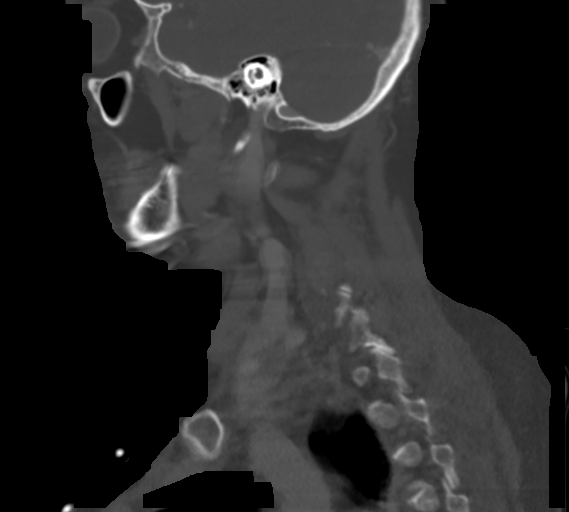
[im 42/101  bone]
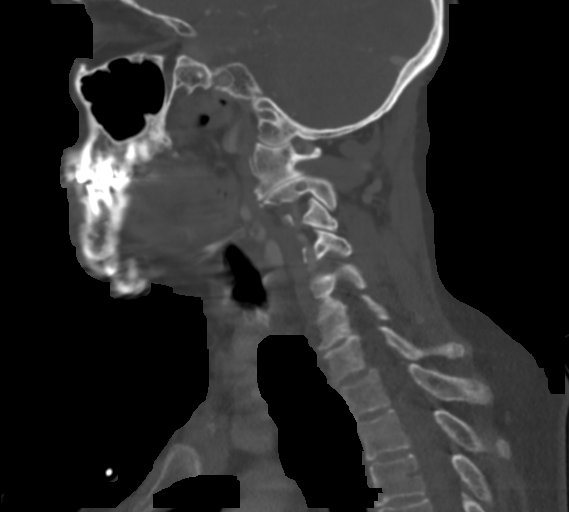
[im 51/101  soft-tissue]
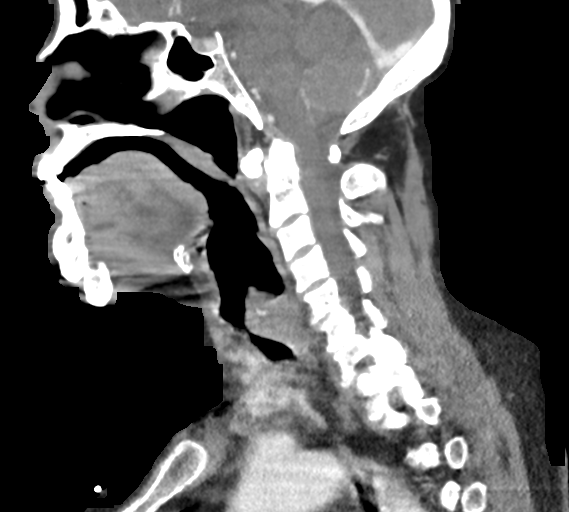
[im 51/101  bone]
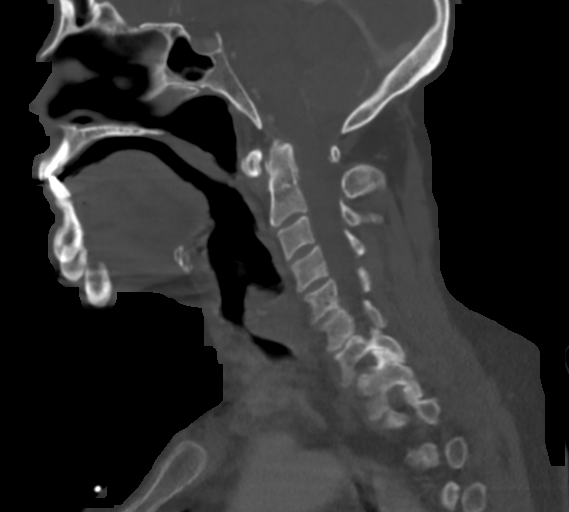
[im 59/101  bone]
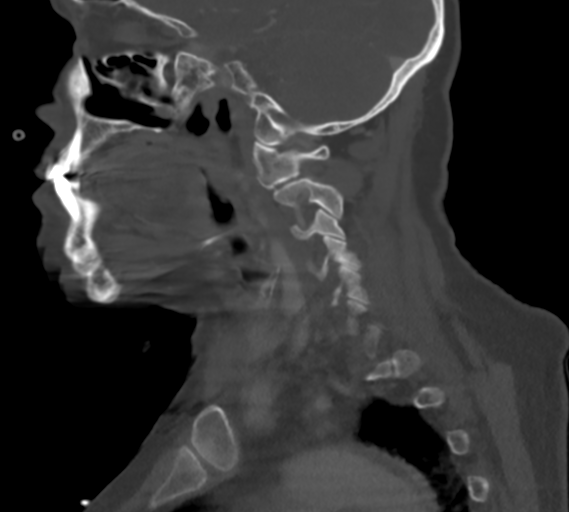
[im 67/101  bone]
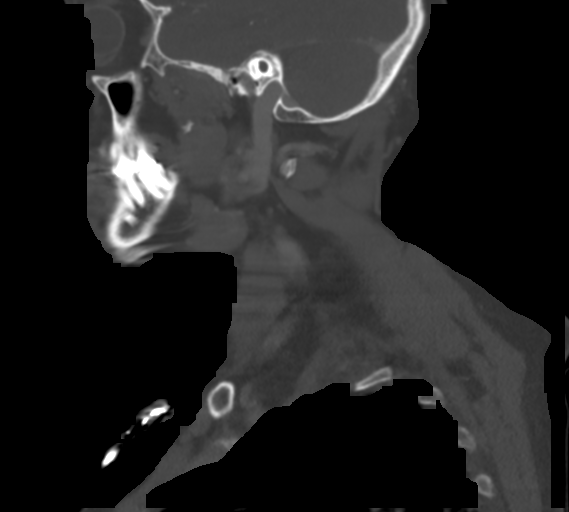

[Series 8: orthogonal st · axial · 0.39mm/px · z∈[+1056,+1164]mm · 3 of 108 slices shown]
[im 27/108  bone]
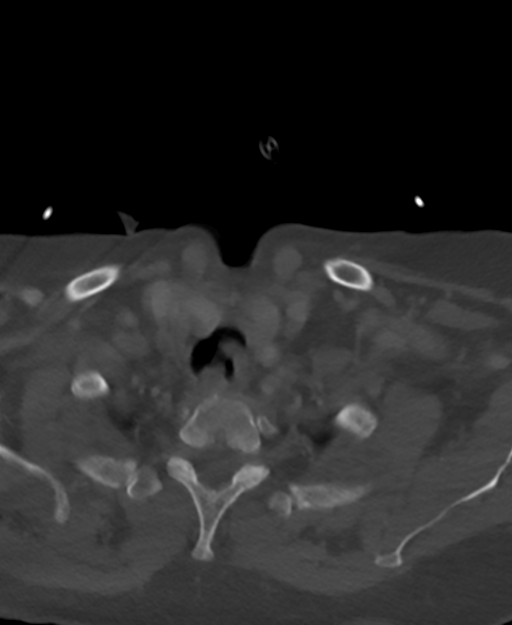
[im 54/108  bone]
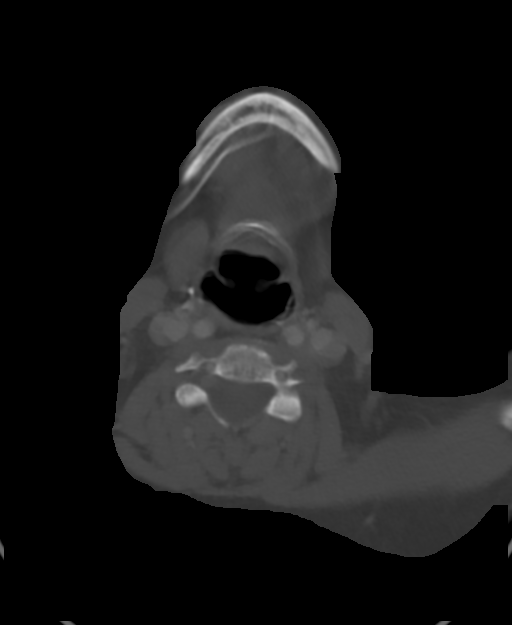
[im 81/108  bone]
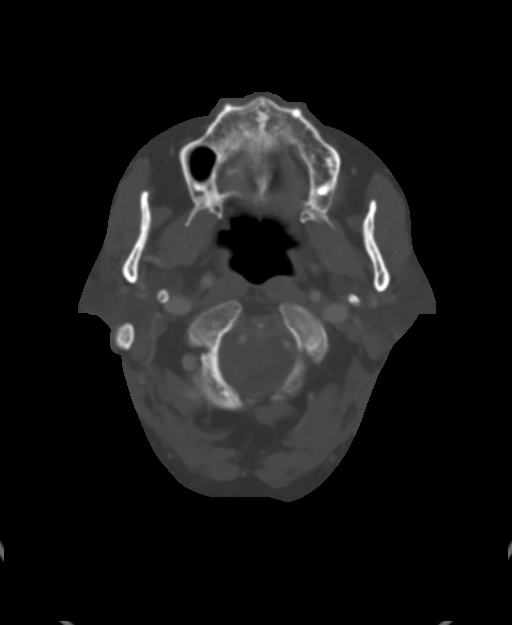

[14 of 33 positions shown; findings below may reference images not displayed]

FINDINGS: Pharynx and larynx: The study again suffers from motion degradation.
Nonspecific edema pattern in the region of the glottis could be due
to angio edema. Mass lesion not excluded but not clearly defined.

Salivary glands: Parotid and submandibular glands are normal.

Thyroid: Normal

Lymph nodes: No enlarged or low-density lymph nodes on either side
of the neck.

Vascular: Normal

Limited intracranial: Normal

Visualized orbits: Normal

Mastoids and visualized paranasal sinuses: Clear

Skeleton: Chronic arthropathy of the C1-2 articulation on the right
which could be associated with neck pain. Degenerative cervical
spondylosis C5-6 and C6-7.

Upper chest: Negative

Other: None
IMPRESSION: Motion degraded examination again showing a nonspecific edema
pattern in the region of the glottis. This could be due to angio
edema. Cannot rule out the possibility of a mass lesion, though this
is not clearly defined. No evidence of adenopathy.

## 2019-12-02 IMAGING — CT CT ANGIO CHEST
2 of 7 series · 19 of 46 positions shown · IV contrast (omnipaque)
Comparison: Chest x-ray [DATE].

CLINICAL DATA: High probability for pulmonary embolus. Respiratory
distress. Decreased O2 sats on room air.

EXAM:
CT ANGIOGRAPHY CHEST WITH CONTRAST
TECHNIQUE: Multidetector CT imaging of the chest was performed using the
standard protocol during bolus administration of intravenous
contrast. Multiplanar CT image reconstructions and MIPs were
obtained to evaluate the vascular anatomy.
CONTRAST:  75mL OMNIPAQUE IOHEXOL 350 MG/ML SOLN

[Series 8: thins · axial · 0.79mm/px · z∈[+1007,+1277]mm · 16 of 302 slices shown]
[im 16/302  lung]
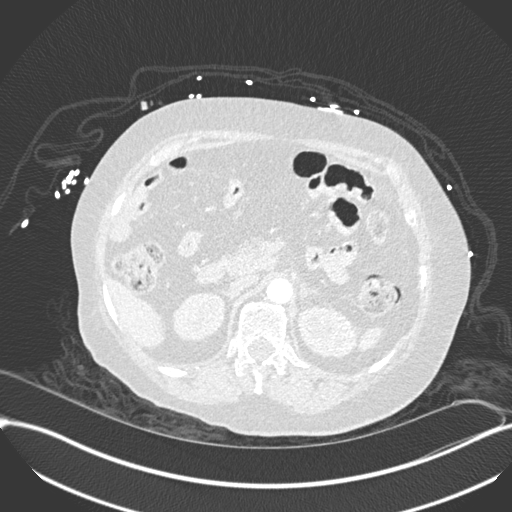
[im 32/302  soft-tissue]
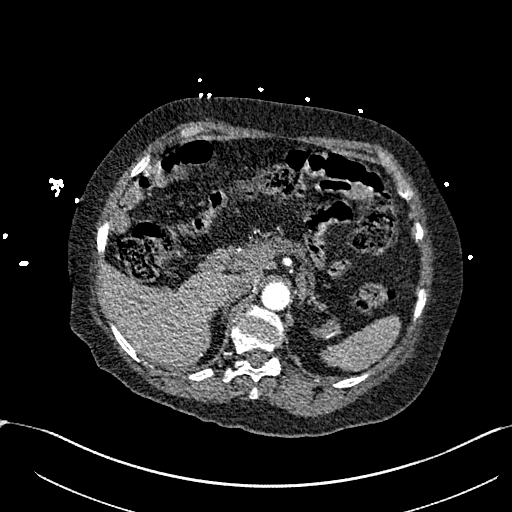
[im 48/302  lung]
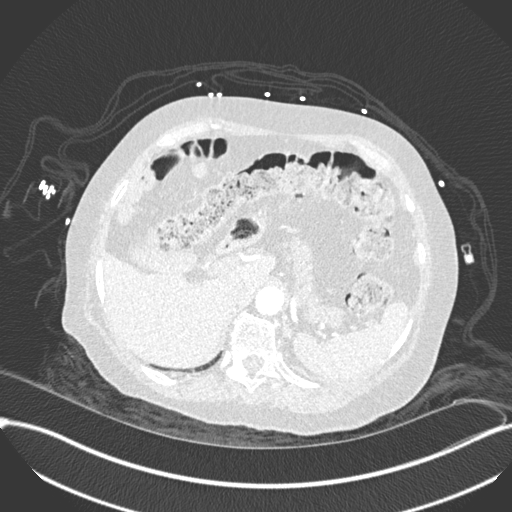
[im 64/302  soft-tissue]
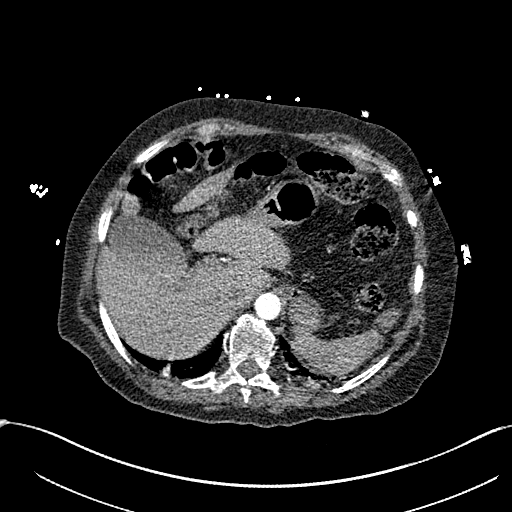
[im 96/302  lung]
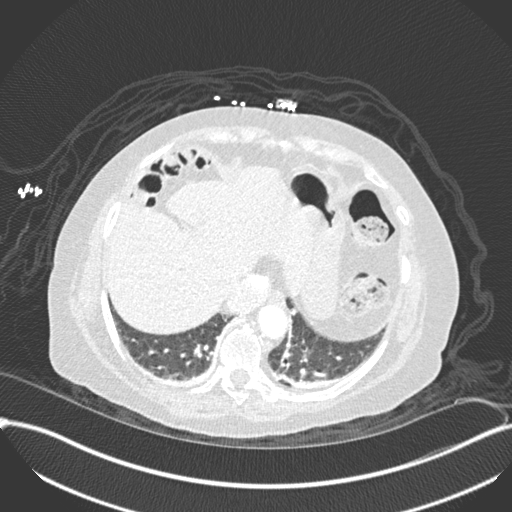
[im 111/302  soft-tissue]
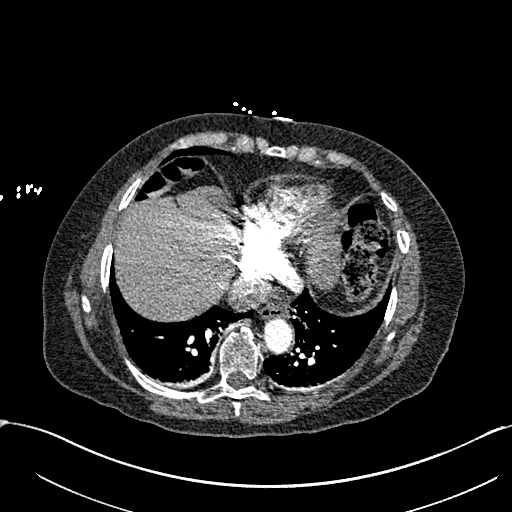
[im 127/302  lung]
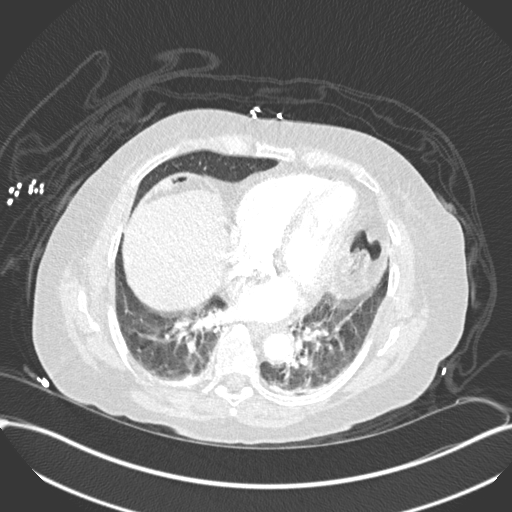
[im 143/302  soft-tissue]
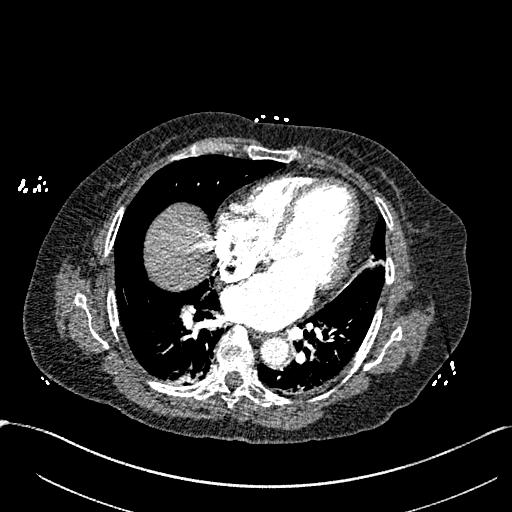
[im 159/302  lung]
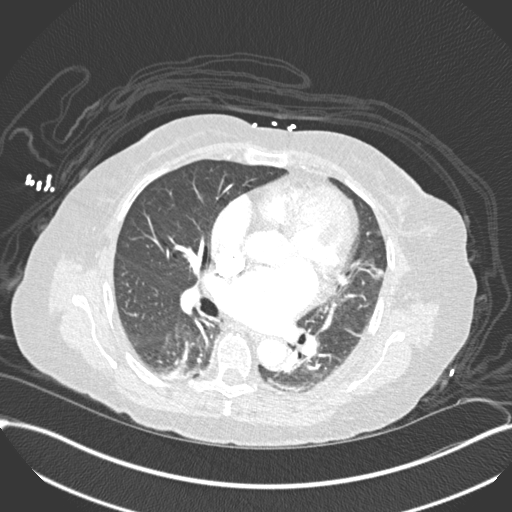
[im 175/302  soft-tissue]
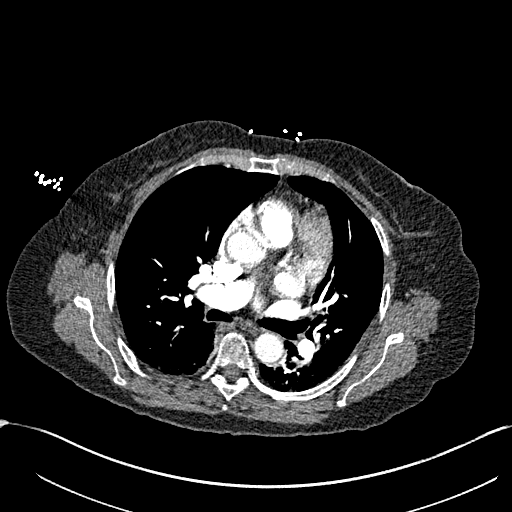
[im 191/302  lung]
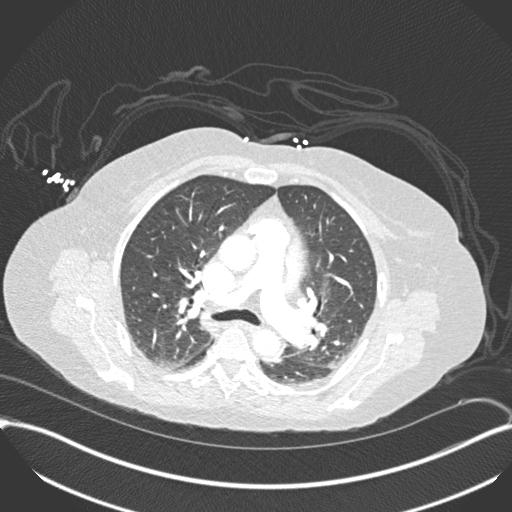
[im 206/302  soft-tissue]
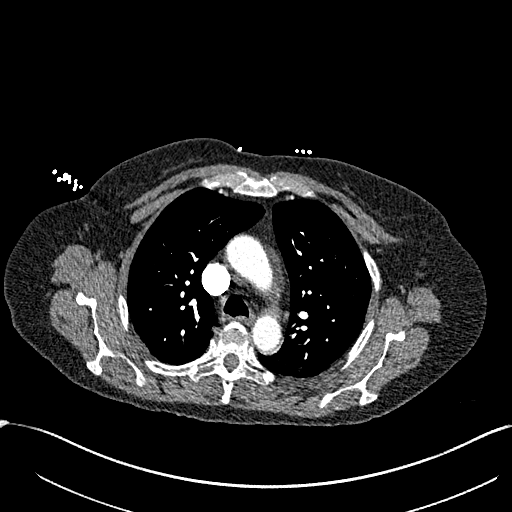
[im 238/302  lung]
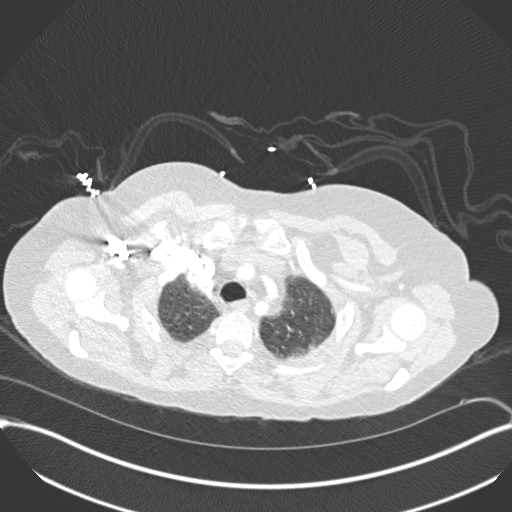
[im 254/302  soft-tissue]
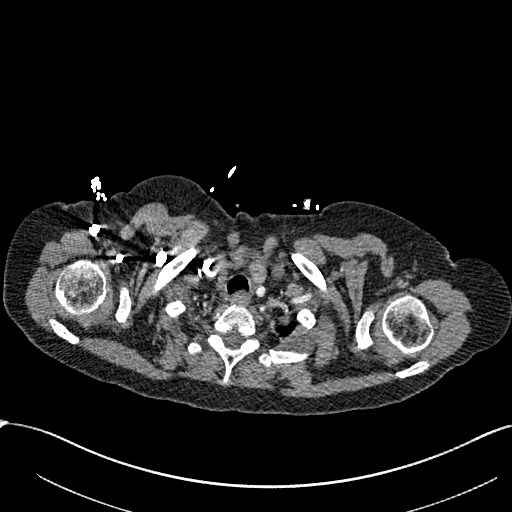
[im 270/302  lung]
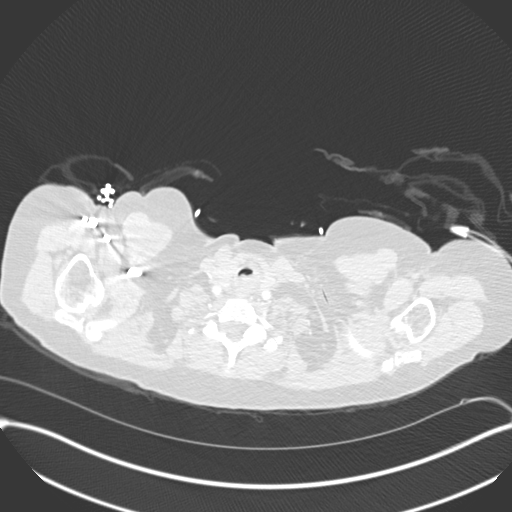
[im 286/302  soft-tissue]
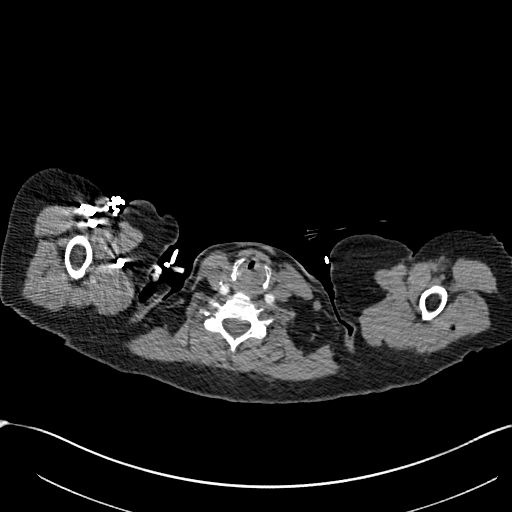

[Series 10: coronal mpr · coronal · 0.67mm/px · 3 of 149 slices shown]
[im 38/149  soft-tissue]
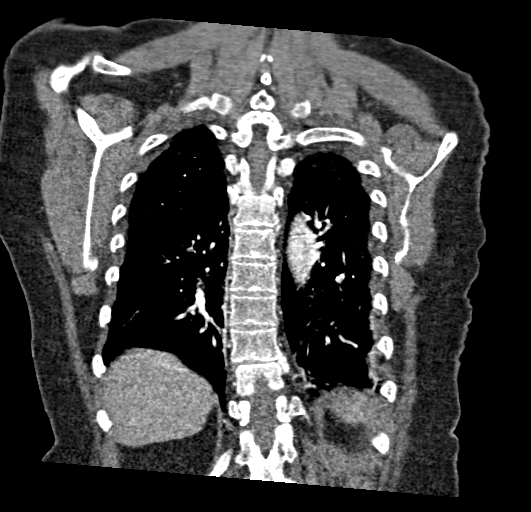
[im 75/149  soft-tissue]
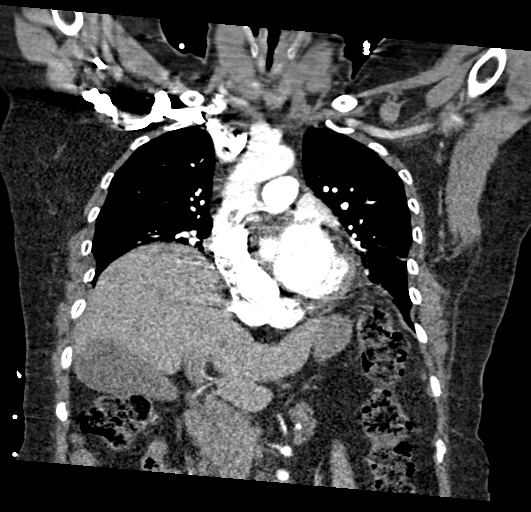
[im 112/149  soft-tissue]
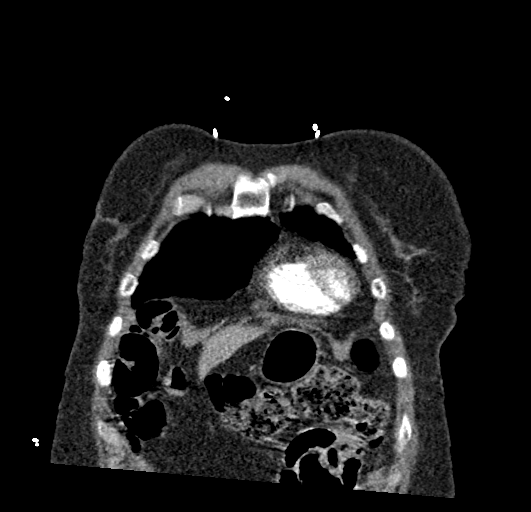

[19 of 46 positions shown; findings below may reference images not displayed]

FINDINGS: Cardiovascular: The thoracic aorta is nonaneurysmal without
atherosclerotic change or dissection. The heart size is borderline
to mildly enlarged. Respiratory motion limits evaluation of lower
lobe pulmonary arteries, particularly on the right. Within these
limitations, no pulmonary emboli are identified.

Mediastinum/Nodes: The thyroid and esophagus are normal. No
effusions. Chest wall is normal. No adenopathy.

Lungs/Pleura: Mucous is seen in the trachea posteriorly. Central
airways are otherwise normal. No pneumothorax. No pulmonary nodules
or masses. Dependent atelectasis identified. No suspicious
infiltrates.

Upper Abdomen: High attenuation material layering in the gallbladder
could represent stones or sludge. No other abnormalities.

Musculoskeletal: No chest wall abnormality. No acute or significant
osseous findings.

Review of the MIP images confirms the above findings.
IMPRESSION: 1. Evaluation of lower lobe pulmonary arteries is mildly limited,
particularly on the right, due to respiratory motion. Within these
limitations, no pulmonary emboli identified.
2. High attenuation layering in the gallbladder could represent
sludge or stones.
3. No other abnormalities.

## 2019-12-02 IMAGING — DX DG CHEST 1V PORT
1 series · 1 of 1 positions shown · non-contrast
Comparison: [DATE]

CLINICAL DATA: Pt YOEL with respiratory distress. Per family
last episode occurred with seizure, no seizure noted this time.
Initial sat 78% on room air, placed on BiPap by EMS. 138/98, 12 lead
unremarkable. Received IM Epi en route, 10 albuterol.

EXAM:
PORTABLE CHEST - 1 VIEW

[chest]
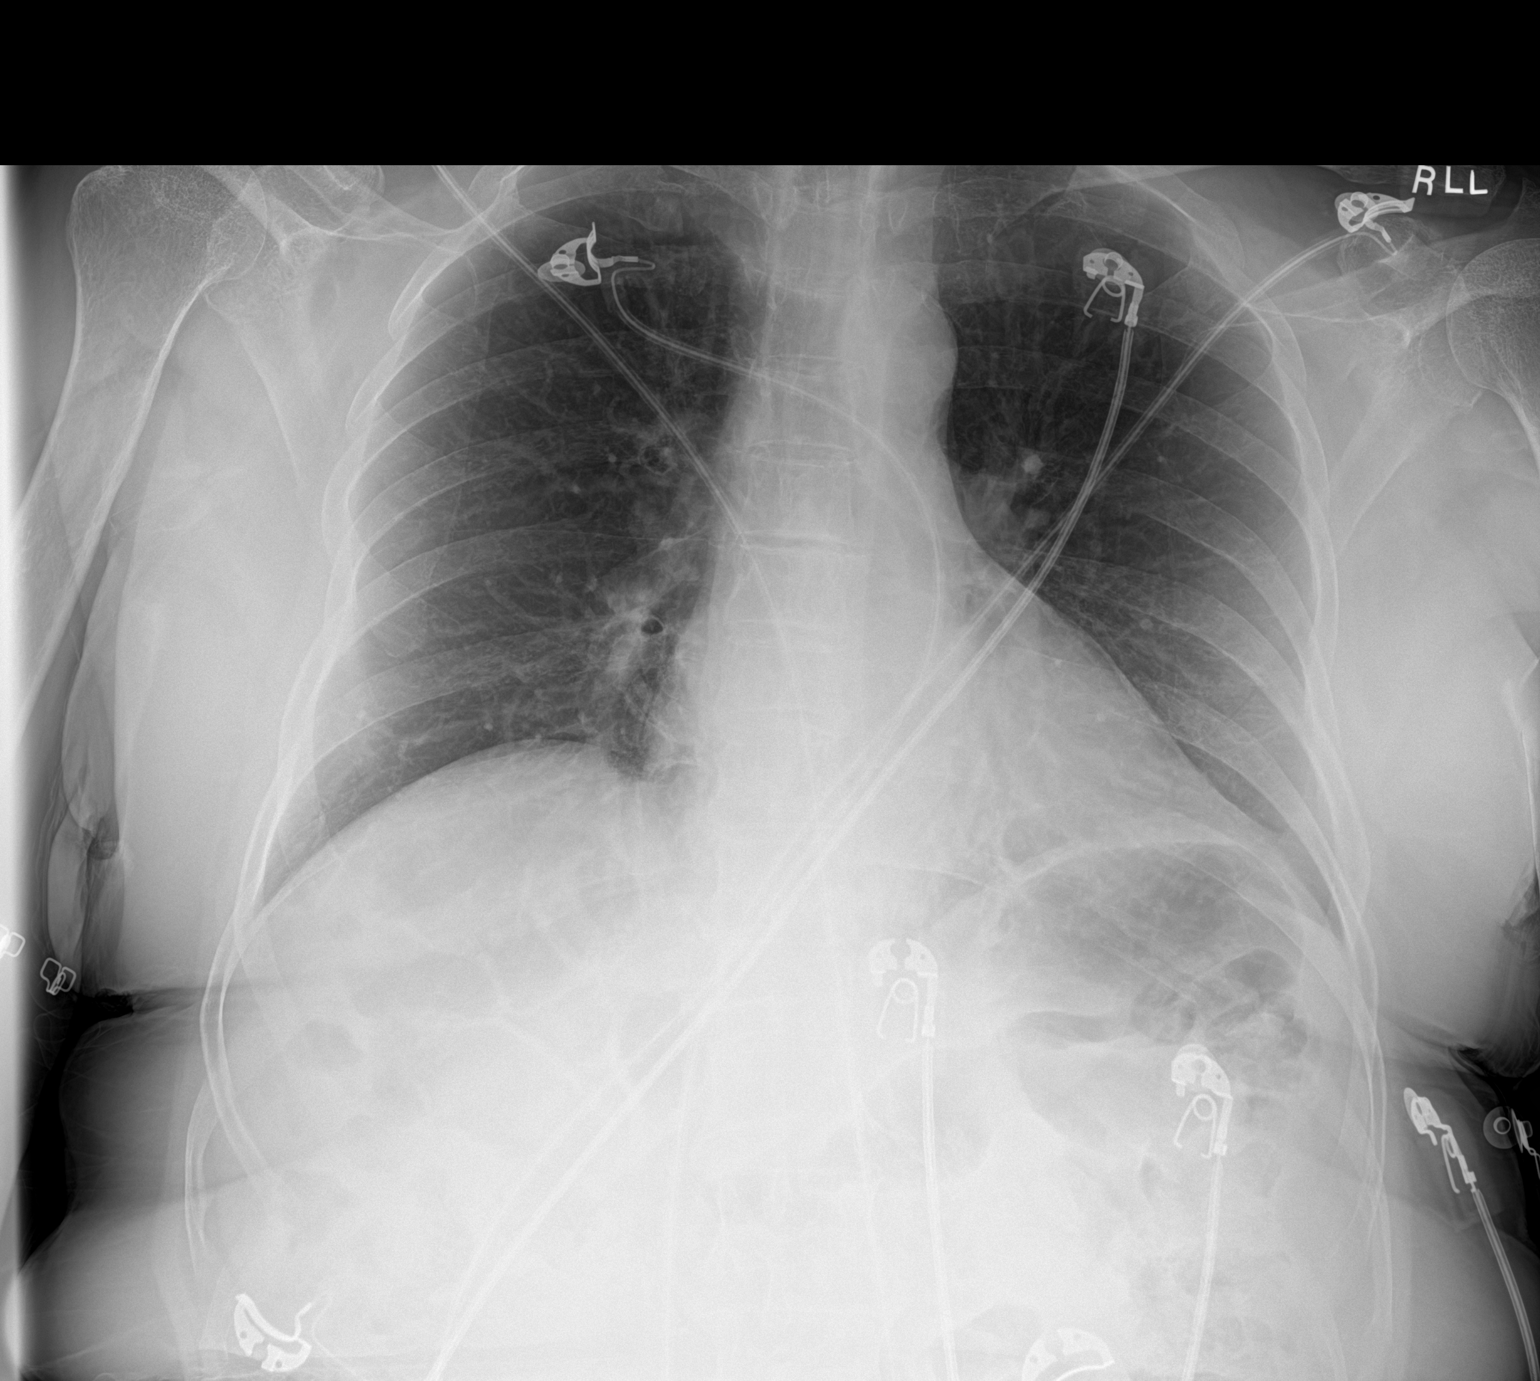

[1 of 1 positions shown; findings below may reference images not displayed]

FINDINGS: Lungs are clear.

Heart size and mediastinal contours are within normal limits.

No effusion.  No pneumothorax.

Visualized bones unremarkable.
IMPRESSION: No acute cardiopulmonary disease.

## 2019-12-02 MED ORDER — FAMOTIDINE IN NACL 20-0.9 MG/50ML-% IV SOLN
20.0000 mg | Freq: Two times a day (BID) | INTRAVENOUS | Status: DC
  Administered 2019-12-02 – 2019-12-03 (×2): 20 mg via INTRAVENOUS
  Filled 2019-12-02 (×2): qty 50

## 2019-12-02 MED ORDER — CHLORHEXIDINE GLUCONATE CLOTH 2 % EX PADS
6.0000 | MEDICATED_PAD | Freq: Every day | CUTANEOUS | Status: DC
  Administered 2019-12-03 – 2019-12-04 (×2): 6 via TOPICAL

## 2019-12-02 MED ORDER — INSULIN ASPART 100 UNIT/ML ~~LOC~~ SOLN
0.0000 [IU] | SUBCUTANEOUS | Status: DC
  Administered 2019-12-02: 5 [IU] via SUBCUTANEOUS
  Administered 2019-12-02: 3 [IU] via SUBCUTANEOUS
  Administered 2019-12-03: 5 [IU] via SUBCUTANEOUS
  Administered 2019-12-03 (×3): 2 [IU] via SUBCUTANEOUS
  Administered 2019-12-03: 3 [IU] via SUBCUTANEOUS
  Administered 2019-12-04 (×2): 2 [IU] via SUBCUTANEOUS
  Administered 2019-12-04: 8 [IU] via SUBCUTANEOUS
  Administered 2019-12-04: 3 [IU] via SUBCUTANEOUS

## 2019-12-02 MED ORDER — IOHEXOL 350 MG/ML SOLN
75.0000 mL | Freq: Once | INTRAVENOUS | Status: AC | PRN
  Administered 2019-12-02: 75 mL via INTRAVENOUS

## 2019-12-02 MED ORDER — METHYLPREDNISOLONE SODIUM SUCC 125 MG IJ SOLR
125.0000 mg | Freq: Once | INTRAMUSCULAR | Status: AC
  Administered 2019-12-02: 125 mg via INTRAVENOUS
  Filled 2019-12-02: qty 2

## 2019-12-02 MED ORDER — FAMOTIDINE 20 MG PO TABS: 20.0000 mg | ORAL_TABLET | Freq: Every day | ORAL | Status: DC

## 2019-12-02 MED ORDER — IPRATROPIUM-ALBUTEROL 0.5-2.5 (3) MG/3ML IN SOLN
3.0000 mL | Freq: Once | RESPIRATORY_TRACT | Status: AC
  Administered 2019-12-02: 3 mL via RESPIRATORY_TRACT
  Filled 2019-12-02: qty 3

## 2019-12-02 MED ORDER — METHYLPREDNISOLONE SODIUM SUCC 125 MG IJ SOLR
60.0000 mg | Freq: Two times a day (BID) | INTRAMUSCULAR | Status: DC
  Administered 2019-12-02 – 2019-12-03 (×2): 60 mg via INTRAVENOUS
  Filled 2019-12-02 (×2): qty 2

## 2019-12-02 MED ORDER — LORATADINE 10 MG PO TABS
10.0000 mg | ORAL_TABLET | Freq: Every day | ORAL | Status: DC
  Administered 2019-12-03 – 2019-12-04 (×2): 10 mg via ORAL
  Filled 2019-12-02 (×2): qty 1

## 2019-12-02 MED ORDER — ENOXAPARIN SODIUM 80 MG/0.8ML ~~LOC~~ SOLN
1.0000 mg/kg | Freq: Two times a day (BID) | SUBCUTANEOUS | Status: DC
  Administered 2019-12-03 – 2019-12-04 (×4): 65 mg via SUBCUTANEOUS
  Filled 2019-12-02 (×4): qty 0.8

## 2019-12-02 MED ORDER — SODIUM CHLORIDE 0.9 % IV SOLN
3.0000 g | Freq: Four times a day (QID) | INTRAVENOUS | Status: DC
  Administered 2019-12-02 – 2019-12-03 (×3): 3 g via INTRAVENOUS
  Filled 2019-12-02 (×4): qty 8

## 2019-12-02 MED ORDER — AMIODARONE HCL 200 MG PO TABS: 200.0000 mg | ORAL_TABLET | Freq: Every day | ORAL | Status: DC

## 2019-12-02 MED ORDER — APIXABAN 5 MG PO TABS
5.0000 mg | ORAL_TABLET | Freq: Two times a day (BID) | ORAL | Status: DC
  Filled 2019-12-02: qty 1

## 2019-12-02 MED ORDER — ATORVASTATIN CALCIUM 40 MG PO TABS
40.0000 mg | ORAL_TABLET | Freq: Every evening | ORAL | Status: DC
  Administered 2019-12-03 – 2019-12-04 (×2): 40 mg via ORAL
  Filled 2019-12-02 (×2): qty 1

## 2019-12-02 MED ORDER — IOHEXOL 300 MG/ML  SOLN
75.0000 mL | Freq: Once | INTRAMUSCULAR | Status: AC | PRN
  Administered 2019-12-02: 75 mL via INTRAVENOUS

## 2019-12-02 MED ORDER — LEVOTHYROXINE SODIUM 88 MCG PO TABS
88.0000 ug | ORAL_TABLET | Freq: Every day | ORAL | Status: DC
  Administered 2019-12-04: 88 ug via ORAL
  Filled 2019-12-02 (×3): qty 1

## 2019-12-02 MED ORDER — PANTOPRAZOLE SODIUM 40 MG IV SOLR
40.0000 mg | Freq: Every day | INTRAVENOUS | Status: DC
  Administered 2019-12-02: 40 mg via INTRAVENOUS
  Filled 2019-12-02: qty 40

## 2019-12-02 NOTE — Consult Note (Signed)
Reason for Consult: called by admitting physician because of patient being readmitted for airway problems Referring Physician: Dr. Darrelyn Hillock   Jamie Mitchell is an 74 y.o. female  HPI: Patient is readmitted again because of respiratory problems. Patient's initial admission on 6/7 demonstrated significant glottic and subglottic swelling as noted on CT scan at that time. She was treated with steroids and antibiotics and this resolved. A follow up CT scan 11 days later (6/18) was essentially normal and follow up in my office revealed patient with no hoarseness and no airway problems on fiberoptic laryngoscopy. However, she was admitted again on 6/25 because of similar airway problems that again was resolved with steroids within 24-48 hours. At that time, she was seen by Dr Benjamine Mola with no airway problems. I recommended at that time that patient be transferred or further evaluated at [facility] by ENT as I am unsure as to etiology of patient's recurrent swelling. She has no fixed stenosis.   I was informed by Dr. Higinio Plan that patient is admitted again for similar airway problems and I would recommend treatment with steroids and antibiotics and referral to [facility] ENT for further evaluation and treatment as recommended on previous hospitalization.  Past Medical History:  Diagnosis Date  . Atrial fibrillation ([place])   . 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 Hypertension   . Stroke ([place])   . Thyroid disease     Past Surgical History:  Procedure Laterality Date  . IR CT HEAD LTD  07/20/2019  . IR PERCUTANEOUS ART THROMBECTOMY/INFUSION INTRACRANIAL INC DIAG ANGIO  07/20/2019  . RADIOLOGY WITH ANESTHESIA N/A 07/20/2019   Procedure: IR WITH ANESTHESIA;  Surgeon: Luanne Bras, MD;  Location: [place];  Service: Radiology;  Laterality: N/A;    Social History:  reports that she has never smoked. She has never used smokeless tobacco. She reports that she does not drink alcohol and does not  use drugs.  Allergies: No Known Allergies  Medications: I have reviewed the patient's current medications.  Results for orders placed or performed during the hospital encounter of 12/02/19 (from the past 48 hour(s))  I-Stat arterial blood gas, ED     Status: Abnormal   Collection Time: 12/02/19  9:23 AM  Result Value Ref Range   pH, Arterial 7.410 7.35 - 7.45   pCO2 arterial 51.7 (H) 32 - 48 mmHg   pO2, Arterial 461 (H) 83 - 108 mmHg   Bicarbonate 32.8 (H) 20.0 - 28.0 mmol/L   TCO2 34 (H) 22 - 32 mmol/L   O2 Saturation 100.0 %   Acid-Base Excess 7.0 (H) 0.0 - 2.0 mmol/L   Sodium 136 135 - 145 mmol/L   Potassium 3.3 (L) 3.5 - 5.1 mmol/L   Calcium, Ion 1.15 1.15 - 1.40 mmol/L   HCT 42.0 36 - 46 %   Hemoglobin 14.3 12.0 - 15.0 g/dL   Collection site Radial    Drawn by RT    Sample type ARTERIAL   Troponin I (High Sensitivity)     Status: None   Collection Time: 12/02/19  9:25 AM  Result Value Ref Range   Troponin I (High Sensitivity) 11 <18 ng/L    Comment: (NOTE) Elevated high sensitivity troponin I (hsTnI) values and significant  changes across serial measurements may suggest ACS but many other  chronic and acute conditions are known to elevate hsTnI results.  Refer to the "Links" section for chest pain algorithms and additional  guidance. Performed at [facility], [address] [place], [place] [address]   Lactic acid, plasma     Status: None   Collection Time: 12/02/19  9:25 AM  Result Value Ref Range   Lactic Acid, Venous 1.4 0.5 - 1.9 mmol/L    Comment: Performed at [facility] [address], [place], [place] [address]  Culture, blood (routine x 2)     Status: None (Preliminary result)   Collection Time: 12/02/19  9:30 AM   Specimen: BLOOD  Result Value Ref Range   Specimen Description BLOOD RIGHT ANTECUBITAL    Special Requests      BOTTLES DRAWN AEROBIC AND ANAEROBIC Blood Culture adequate volume   Culture      NO GROWTH < 12 HOURS Performed  at [facility], [place] [address], [place], [place] [address]    Report Status PENDING   Culture, blood (routine x 2)     Status: None (Preliminary result)   Collection Time: 12/02/19 10:20 AM   Specimen: BLOOD  Result Value Ref Range   Specimen Description BLOOD LEFT ANTECUBITAL    Special Requests      BOTTLES DRAWN AEROBIC AND ANAEROBIC Blood Culture results may not be optimal due to an inadequate volume of blood received in culture bottles   Culture      NO GROWTH < 12 HOURS Performed at [facility], [place] [address], [place], [place] [address]    Report Status PENDING   SARS Coronavirus 2 by RT PCR (hospital order, performed in [facility] hospital lab) Nasopharyngeal Nasopharyngeal Swab     Status: None   Collection Time: 12/02/19 10:20 AM   Specimen: Nasopharyngeal Swab  Result Value Ref Range   SARS Coronavirus 2 NEGATIVE NEGATIVE    Comment: (NOTE) SARS-CoV-2 target nucleic acids are NOT DETECTED.  The SARS-CoV-2 RNA is generally detectable in upper and lower respiratory specimens during the acute phase of infection. The lowest concentration of SARS-CoV-2 viral copies this assay can detect is 250 copies / mL. A negative result does not preclude SARS-CoV-2 infection and should not be used as the sole basis for treatment or other patient management decisions.  A negative result may occur with improper specimen collection / handling, submission of specimen other than nasopharyngeal swab, presence of viral mutation(s) within the areas targeted by this assay, and inadequate number of viral copies (<250 copies / mL). A negative result must be combined with clinical observations, patient history, and epidemiological information.  Fact Sheet for Patients:   StrictlyIdeas.no  Fact Sheet for Healthcare Providers: BankingDealers.co.za  This test is not yet approved or  cleared by the [place] FDA and has been  authorized for detection and/or diagnosis of SARS-CoV-2 by FDA under an Emergency Use Authorization (EUA).  This EUA will remain in effect (meaning this test can be used) for the duration of the COVID-19 declaration under Section 564(b)(1) of the Act, 21 U.S.C. section 360bbb-3(b)(1), unless the authorization is terminated or revoked sooner.  Performed at [facility], [place] [address] [place], [place] [address]   Brain natriuretic peptide     Status: None   Collection Time: 12/02/19 10:21 AM  Result Value Ref Range   B Natriuretic Peptide 92.1 0.0 - 100.0 pg/mL    Comment: Performed at [facility] [address], [place], [place] [address]  CBC with Differential/Platelet     Status: Abnormal   Collection Time: 12/02/19 10:21 AM  Result Value Ref Range   WBC  12.4 (H) 4.0 - 10.5 K/uL   RBC 4.84 3.87 - 5.11 MIL/uL   Hemoglobin 13.8 12.0 - 15.0 g/dL   HCT 43.4 36 - 46 %   MCV 89.7 80.0 - 100.0 fL   MCH 28.5 26.0 - 34.0 pg   MCHC 31.8 30.0 - 36.0 g/dL   RDW 16.2 (H) 11.5 - 15.5 %   Platelets 281 150 - 400 K/uL   nRBC 0.0 0.0 - 0.2 %   Neutrophils Relative % 68 %   Neutro Abs 8.5 (H) 1.7 - 7.7 K/uL   Lymphocytes Relative 23 %   Lymphs Abs 2.8 0.7 - 4.0 K/uL   Monocytes Relative 6 %   Monocytes Absolute 0.7 0 - 1 K/uL   Eosinophils Relative 0 %   Eosinophils Absolute 0.0 0 - 0 K/uL   Basophils Relative 0 %   Basophils Absolute 0.0 0 - 0 K/uL   Immature Granulocytes 3 %   Abs Immature Granulocytes 0.35 (H) 0.00 - 0.07 K/uL    Comment: Performed at [facility], [address], [place], [place] [address]  Troponin I (High Sensitivity)     Status: Abnormal   Collection Time: 12/02/19 10:21 AM  Result Value Ref Range   Troponin I (High Sensitivity) 49 (H) <18 ng/L    Comment: RESULT CALLED TO, READ BACK BY AND VERIFIED WITH: S.BERTRAND RN @ 1134 12/02/2019 BY C.EDENS (NOTE) Elevated high sensitivity troponin I (hsTnI) values and significant  changes across  serial measurements may suggest ACS but many other  chronic and acute conditions are known to elevate hsTnI results.  Refer to the Links section for chest pain algorithms and additional  guidance. Performed at [facility], [place] [address] [place], [place] [address]   Basic metabolic panel     Status: Abnormal   Collection Time: 12/02/19 10:21 AM  Result Value Ref Range   Sodium 139 135 - 145 mmol/L   Potassium 4.0 3.5 - 5.1 mmol/L   Chloride 99 98 - 111 mmol/L   CO2 28 22 - 32 mmol/L   Glucose, Bld 166 (H) 70 - 99 mg/dL    Comment: Glucose reference range applies only to samples taken after fasting for at least 8 hours.   BUN 20 8 - 23 mg/dL   Creatinine, Ser 0.94 0.44 - 1.00 mg/dL   Calcium 8.7 (L) 8.9 - 10.3 mg/dL   GFR calc non Af Amer >60 >60 mL/min   GFR calc Af Amer >60 >60 mL/min   Anion gap 12 5 - 15    Comment: Performed at [facility] [address] [place], [place] [address]  Lactic acid, plasma     Status: Abnormal   Collection Time: 12/02/19 12:30 PM  Result Value Ref Range   Lactic Acid, Venous 2.3 (HH) 0.5 - 1.9 mmol/L    Comment: CRITICAL RESULT CALLED TO, READ BACK BY AND VERIFIED WITH: S.BERTRAND RN @ 3532 12/02/2019 BY C.EDENS Performed at [facility], [place] [address], [place], [place] [address]   Troponin I (High Sensitivity)     Status: Abnormal   Collection Time: 12/02/19 12:30 PM  Result Value Ref Range   Troponin I (High Sensitivity) 128 (HH) <18 ng/L    Comment: CRITICAL RESULT CALLED TO, READ BACK BY AND VERIFIED WITH: S.BERTRAND RN @ 6834 12/02/2019 BY C.EDENS (NOTE) Elevated high sensitivity troponin I (hsTnI) values and significant  changes across serial measurements may suggest ACS but many other  chronic and acute conditions are known to elevate hsTnI results.  Refer to the Links section for chest pain algorithms and additional  guidance. Performed at [facility], [place] [address], [place], [place] [address]      CT SOFT TISSUE NECK W CONTRAST  Result Date: 12/02/2019 CLINICAL DATA:  Shortness of breath.  Glottic edema.  Stridor. EXAM: CT NECK WITH CONTRAST TECHNIQUE: Multidetector CT imaging of the neck was performed using the standard protocol following the bolus administration of intravenous contrast. CONTRAST:  75 cc Omnipaque 300 COMPARISON:  11/17/2019 FINDINGS: Pharynx and larynx: The study again suffers from motion degradation. Nonspecific edema pattern in the region of the glottis could be due to angio edema. Mass lesion not excluded but not clearly defined. Salivary glands: Parotid and submandibular glands are normal. Thyroid: Normal Lymph nodes: No enlarged or low-density lymph nodes on either side of the neck. 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 Upper chest: Negative Other: None IMPRESSION: Motion degraded examination again showing a nonspecific edema pattern in the region of the glottis. This could be due to angio edema. Cannot rule out the possibility of a mass lesion, though this is not clearly defined. No evidence of adenopathy. Electronically Signed   By: Nelson Chimes M.D.   On: 12/02/2019 15:50   CT Angio Chest PE W and/or Wo Contrast  Result Date: 12/02/2019 CLINICAL DATA:  High probability for pulmonary embolus. Respiratory distress. Decreased O2 sats on room air. EXAM: CT ANGIOGRAPHY CHEST WITH CONTRAST TECHNIQUE: Multidetector CT imaging of the chest was performed using the standard protocol during bolus administration of intravenous contrast. Multiplanar CT image reconstructions and MIPs were obtained to evaluate the vascular anatomy. CONTRAST:  58mL OMNIPAQUE IOHEXOL 350 MG/ML SOLN COMPARISON:  Chest x-ray December 02, 2019. FINDINGS: Cardiovascular: The  thoracic aorta is nonaneurysmal without atherosclerotic change or dissection. The heart size is borderline to mildly enlarged. Respiratory motion limits evaluation of lower lobe pulmonary arteries, particularly on the right. Within these limitations, no pulmonary emboli are identified. Mediastinum/Nodes: The thyroid and esophagus are normal. No effusions. Chest wall is normal. No adenopathy. Lungs/Pleura: Mucous is seen in the trachea posteriorly. Central airways are otherwise normal. No pneumothorax. No pulmonary nodules or masses. Dependent atelectasis identified. No suspicious infiltrates. Upper Abdomen: High attenuation material layering in the gallbladder could represent stones or sludge. No other abnormalities. Musculoskeletal: No chest wall abnormality. No acute or significant osseous findings. Review of the MIP images confirms the above findings. IMPRESSION: 1. Evaluation of lower lobe pulmonary arteries is mildly limited, particularly on the right, due to respiratory motion. Within these limitations, no pulmonary emboli identified. 2. High attenuation layering in the gallbladder could represent sludge or stones. 3. No other abnormalities. Electronically Signed   By: Dorise Bullion III M.D   On: 12/02/2019 12:50   DG Chest Port 1 View  Result Date: 12/02/2019 CLINICAL DATA:  Pt BIB [facility] with respiratory distress. Per family last episode occurred with seizure, no seizure noted this time. Initial sat 78% on room air, placed on BiPap by EMS. 138/98, 12 lead unremarkable. Received IM Epi en route, 10 albuterol. EXAM: PORTABLE CHEST - 1 VIEW COMPARISON:  11/17/2019 FINDINGS: Lungs are clear. Heart size and mediastinal contours are within normal limits. No effusion.  No pneumothorax. Visualized bones unremarkable. IMPRESSION: No acute  cardiopulmonary disease. Electronically Signed   By: Lucrezia Europe M.D.   On: 12/02/2019 09:55    Assessment/Plan: Discussed with Dr. Higinio Plan concerning transferring patient to  [facility] 12/02/2019, 4:54 PM

## 2019-12-02 NOTE — ED Provider Notes (Signed)
[facility] EMERGENCY DEPARTMENT Provider Note   CSN: [id number] Arrival date & time: 12/02/19  0973     History Chief Complaint  Patient presents with  . Respiratory Distress    Jamie Mitchell is a 74 y.o. female.  Pt presents to the ED today with sob.  Pt has had several admissions recently for sob.  The last admission was associated with stridor and sub-glottic edema.  Pt is scheduled for allergy f/u on 7/12 and has been on a prednisone taper until 7/17.   Pt developed sob this am suddenly.  Per EMS, her O2 sat was 78% on RA.  EMS gave pt 0.3 mg epi im and 10 mg albuterol en route.  They also put her on bipap.  Pt is very sob upon arrival.  No stridor noted.  Pt has aphasia due to a prior stroke.  She is unable to give me any hx.         Past Medical History:  Diagnosis Date  . Atrial fibrillation ([place])   . 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 Hypertension   . Stroke ([place])   . Thyroid disease     Patient Active Problem List   Diagnosis Date Noted  . Prolonged PTT (partial thromboplastin time) 11/19/2019  . 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 Esophageal dysmotility 11/18/2019  . Gastroesophageal reflux 11/18/2019  . 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 Edema glottis 11/17/2019  . Stridor 10/31/2019  . SOB (shortness of breath) 10/30/2019  . Subglottic edema 10/30/2019  . Renal failure 07/25/2019  . Prediabetes 07/25/2019  . 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 Dysphagia 07/25/2019  . 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 Acute respiratory failure with hypoxemia ([place])   . Tachycardia-bradycardia syndrome ([place])   . Atrial fibrillation with RVR ([place])   . Hypertension 07/23/2015  . Hyperlipidemia 07/23/2015  . DJD (degenerative joint disease)  of knee 01/25/2014  . Hypothyroidism 01/25/2014    Past Surgical History:  Procedure Laterality Date  . IR CT HEAD LTD  07/20/2019  . IR PERCUTANEOUS ART THROMBECTOMY/INFUSION INTRACRANIAL INC DIAG ANGIO  07/20/2019  . RADIOLOGY WITH ANESTHESIA N/A 07/20/2019   Procedure: IR WITH ANESTHESIA;  Surgeon: Luanne Bras, MD;  Location: [facility];  Service: Radiology;  Laterality: N/A;     OB History   No obstetric history on file.     Family History  Family history unknown: Yes    Social History   Tobacco Use  . Smoking status: Never Smoker  . Smokeless tobacco: Never Used  Substance Use Topics  . Alcohol use: No  . Drug use: No    Home Medications Prior to Admission medications   Medication Sig Start Date End Date Taking? Authorizing Provider  albuterol (VENTOLIN HFA) 108 (90 Base) MCG/ACT inhaler Inhale 1-2 puffs into the lungs every 6 (six) hours as needed for wheezing or shortness of breath. 11/28/19  Yes Charlott Rakes, MD  amiodarone (PACERONE) 200 MG tablet Take 1 tablet (200 mg total) by mouth daily. 09/14/19  Yes Donato Heinz, MD  amoxicillin (AMOXIL) 532 MG tablet Take by mouth 2 (two) times daily.   Yes [provider]  apixaban (ELIQUIS) 5 MG TABS tablet Take 1 tablet (5 mg total) by mouth 2 (two) times daily. 10/02/19  Yes Evans Lance, MD  atorvastatin (LIPITOR) 40 MG tablet Take 1 tablet (40 mg total) by mouth daily. Patient taking differently: 40 mg every evening.  08/02/19  Yes Angiulli, Lavon Paganini, PA-C  cetirizine (ZYRTEC ALLERGY) 10 MG tablet Take 1 tablet (10 mg total) by mouth 2 (two) times daily. 11/20/19  Yes Brimage, Ronnette Juniper, DO  chlorpheniramine-HYDROcodone (TUSSIONEX PENNKINETIC ER) 10-8 MG/5ML SUER Take 5 mLs by mouth every 12 (twelve) hours as needed for cough. 11/28/19  Yes Charlott Rakes, MD  diclofenac Sodium (VOLTAREN) 1 % GEL Apply 2 g topically 4 (four) times daily. 11/15/19  Yes Argentina Donovan, PA-C  EPINEPHrine 0.3 mg/0.3 mL IJ  SOAJ injection Inject 0.3 mLs (0.3 mg total) into the muscle as needed for anaphylaxis. 11/20/19  Yes Brimage, Vondra, DO  famotidine (PEPCID) 20 MG tablet Take 1 tablet (20 mg total) by mouth 2 (two) times daily. 11/20/19 11/19/20 Yes Brimage, Ronnette Juniper, DO  levothyroxine (SYNTHROID) 88 MCG tablet Take 1 tablet (88 mcg total) by mouth daily before breakfast. 11/29/19  Yes Charlott Rakes, MD  metFORMIN (GLUCOPHAGE) 500 MG tablet Take 1 tablet (500 mg total) by mouth daily with breakfast. 11/28/19  Yes Newlin, Enobong, MD  omeprazole (PRILOSEC) 40 MG capsule Take 40 mg by mouth daily.   Yes [provider]  predniSONE (DELTASONE) 10 MG tablet Take 4 tablets (40 mg total) by mouth daily for 5 days, THEN 2 tablets (20 mg total) daily for 5 days, THEN 1 tablet (10 mg total) daily for 5 days, THEN 0.5 tablets (5 mg total) daily for 5 days. 11/21/19 12/11/19 Yes Brimage, Ronnette Juniper, DO  cetirizine (ZYRTEC ALLERGY) 10 MG tablet Take 1 tablet (10 mg total) by mouth daily. Patient not taking: Reported on 12/02/2019 11/20/19 11/19/20  Lyndee Hensen, DO    Allergies    Patient has no known allergies.  Review of Systems   Review of Systems  Respiratory: Positive for shortness of breath.   All other systems reviewed and are negative.   Physical Exam Updated Vital Signs BP 126/74   Pulse 66   Temp 98.4 F (36.9 C) (Axillary)   Resp 15   Ht 4\' 11"  (1.499 m)   Wt 65 kg   SpO2 97%   BMI 28.94 kg/m   Physical Exam Vitals and nursing note reviewed.  Constitutional:      General: She is in acute distress.     Appearance: She is ill-appearing.  HENT:     Head: Normocephalic and atraumatic.     Right Ear: External ear normal.     Left Ear: External ear normal.     Nose: Nose normal.     Mouth/Throat:     Mouth: Mucous membranes are moist.     Pharynx: Oropharynx is clear.  Eyes:     Extraocular Movements: Extraocular movements intact.     Conjunctiva/sclera: Conjunctivae normal.     Pupils: Pupils  are equal, round, and reactive to light.  Cardiovascular:     Rate and Rhythm: Normal rate and regular rhythm.     Pulses: Normal pulses.     Heart sounds: Normal heart sounds.  Pulmonary:     Effort: Tachypnea and respiratory distress present.  Abdominal:     General: Abdomen is flat. Bowel sounds are normal.  Musculoskeletal:        General: Normal range of motion.     Cervical back: Normal range of motion and neck supple.  Skin:    General: Skin is warm.     Capillary  Refill: Capillary refill takes less than 2 seconds.  Neurological:     Mental Status: She is alert.  Psychiatric:        Mood and Affect: Mood normal.        Behavior: Behavior normal.        Thought Content: Thought content normal.     ED Results / Procedures / Treatments   Labs (all labs ordered are listed, but only abnormal results are displayed) Labs Reviewed  LACTIC ACID, PLASMA - Abnormal; Notable for the following components:      Result Value   Lactic Acid, Venous 2.3 (*)    All other components within normal limits  CBC WITH DIFFERENTIAL/PLATELET - Abnormal; Notable for the following components:   WBC 12.4 (*)    RDW 16.2 (*)    Neutro Abs 8.5 (*)    Abs Immature Granulocytes 0.35 (*)    All other components within normal limits  BASIC METABOLIC PANEL - Abnormal; Notable for the following components:   Glucose, Bld 166 (*)    Calcium 8.7 (*)    All other components within normal limits  I-STAT ARTERIAL BLOOD GAS, ED - Abnormal; Notable for the following components:   pCO2 arterial 51.7 (*)    pO2, Arterial 461 (*)    Bicarbonate 32.8 (*)    TCO2 34 (*)    Acid-Base Excess 7.0 (*)    Potassium 3.3 (*)    All other components within normal limits  TROPONIN I (HIGH SENSITIVITY) - Abnormal; Notable for the following components:   Troponin I (High Sensitivity) 49 (*)    All other components within normal limits  TROPONIN I (HIGH SENSITIVITY) - Abnormal; Notable for the following components:    Troponin I (High Sensitivity) 128 (*)    All other components within normal limits  CULTURE, BLOOD (ROUTINE X 2)  CULTURE, BLOOD (ROUTINE X 2)  SARS CORONAVIRUS 2 BY RT PCR (HOSPITAL ORDER, [facility])  LACTIC ACID, PLASMA  BRAIN NATRIURETIC PEPTIDE  CBC WITH DIFFERENTIAL/PLATELET  CBC WITH DIFFERENTIAL/PLATELET  TROPONIN I (HIGH SENSITIVITY)    EKG EKG Interpretation  Date/Time:  Saturday December 02 2019 09:17:40 EDT Ventricular Rate:  70 PR Interval:    QRS Duration: 98 QT Interval:  560 QTC Calculation: 605 R Axis:   -31 Text Interpretation: Sinus rhythm Left axis deviation Low voltage, precordial leads RSR' in V1 or V2, probably normal variant Borderline abnrm T, anterolateral leads Prolonged QT interval No significant change since last tracing Confirmed by Isla Pence [phone]) on 12/02/2019 10:12:56 AM   Radiology CT Angio Chest PE W and/or Wo Contrast  Result Date: 12/02/2019 CLINICAL DATA:  High probability for pulmonary embolus. Respiratory distress. Decreased O2 sats on room air. EXAM: CT ANGIOGRAPHY CHEST WITH CONTRAST TECHNIQUE: Multidetector CT imaging of the chest was performed using the standard protocol during bolus administration of intravenous contrast. Multiplanar CT image reconstructions and MIPs were obtained to evaluate the vascular anatomy. CONTRAST:  54mL OMNIPAQUE IOHEXOL 350 MG/ML SOLN COMPARISON:  Chest x-ray December 02, 2019. FINDINGS: Cardiovascular: The thoracic aorta is nonaneurysmal without atherosclerotic change or dissection. The heart size is borderline to mildly enlarged. Respiratory motion limits evaluation of lower lobe pulmonary arteries, particularly on the right. Within these limitations, no pulmonary emboli are identified. Mediastinum/Nodes: The thyroid and esophagus are normal. No effusions. Chest wall is normal. No adenopathy. Lungs/Pleura: Mucous is seen in the trachea posteriorly. Central airways are otherwise normal. No  pneumothorax. No pulmonary nodules  or masses. Dependent atelectasis identified. No suspicious infiltrates. Upper Abdomen: High attenuation material layering in the gallbladder could represent stones or sludge. No other abnormalities. Musculoskeletal: No chest wall abnormality. No acute or significant osseous findings. Review of the MIP images confirms the above findings. IMPRESSION: 1. Evaluation of lower lobe pulmonary arteries is mildly limited, particularly on the right, due to respiratory motion. Within these limitations, no pulmonary emboli identified. 2. High attenuation layering in the gallbladder could represent sludge or stones. 3. No other abnormalities. Electronically Signed   By: Dorise Bullion III M.D   On: 12/02/2019 12:50   DG Chest Port 1 View  Result Date: 12/02/2019 CLINICAL DATA:  Pt BIB [facility] with respiratory distress. Per family last episode occurred with seizure, no seizure noted this time. Initial sat 78% on room air, placed on BiPap by EMS. 138/98, 12 lead unremarkable. Received IM Epi en route, 10 albuterol. EXAM: PORTABLE CHEST - 1 VIEW COMPARISON:  11/17/2019 FINDINGS: Lungs are clear. Heart size and mediastinal contours are within normal limits. No effusion.  No pneumothorax. Visualized bones unremarkable. IMPRESSION: No acute cardiopulmonary disease. Electronically Signed   By: Lucrezia Europe M.D.   On: 12/02/2019 09:55    Procedures Procedures (including critical care time)  Medications Ordered in ED Medications  methylPREDNISolone sodium succinate (SOLU-MEDROL) 125 mg/2 mL injection 125 mg (125 mg Intravenous Given 12/02/19 0921)  iohexol (OMNIPAQUE) 350 MG/ML injection 75 mL (75 mLs Intravenous Contrast Given 12/02/19 1224)    ED Course  I have reviewed the triage vital signs and the nursing notes.  Pertinent labs & imaging results that were available during my care of the patient were reviewed by me and considered in my medical decision making (see chart for  details).    MDM Rules/Calculators/A&P                          Pt put on bipap upon arrival here.  She looks much more comfortable.  CXR is clear.  CT angio chest neg for PE.  Troponin is slightly elevated; likely from work of breathing.  Pt has been weaned from bipap and is no 2L.  Reason for acute hypoxia is still unclear.  Pt d/w Dr. Higinio Plan ([facility] resident) for admission.  CRITICAL CARE Performed by: Isla Pence   Total critical care time: 30 minutes  Critical care time was exclusive of separately billable procedures and treating other patients.  Critical care was necessary to treat or prevent imminent or life-threatening deterioration.  Critical care was time spent personally by me on the following activities: development of treatment plan with patient and/or surrogate as well as nursing, discussions with consultants, evaluation of patient's response to treatment, examination of patient, obtaining history from patient or surrogate, ordering and performing treatments and interventions, ordering and review of laboratory studies, ordering and review of radiographic studies, pulse oximetry and re-evaluation of patient's condition.   Final Clinical Impression(s) / ED Diagnoses Final diagnoses:  Acute respiratory failure with hypoxia [facility])    Rx / DC Orders ED Discharge Orders    None       Isla Pence, MD 12/02/19 1322

## 2019-12-02 NOTE — H&P (Addendum)
[facility] Admission History and Physical Service Pager: [phone]  Patient name: Jamie Mitchell Medical record number: [id number] Date of birth: 1945/08/30 Age: 74 y.o. Gender: female  Primary Care Provider: Charlott Rakes, MD  Consultants: ENT Code Status: FULL Preferred Emergency Contact: Son  Chief Complaint: "SOB"  Assessment and Plan: Idolina Mantell is a 74 y.o. female presenting with shortness of breath. PMH is significant for glottic edema, hypothyroidism, A-fib, HTN, embolic stroke involving left MCA.   Patient admitted for acute hypoxic respiratory failure in the setting of presumed recurrent angioedema/subglottic edema of unknown etiology.  This recurrent episodes initially started shortly after her admission in 06/2019 for acute CVA and new onset atrial fibrillation, ?if related to intubation vs medication AE started during this time.  On arrival, she received IV solu-medrol, epi, and required BiPAP with significant improvement.  CTA ruled out PE or infiltrate.  During our evaluation, she was breathing comfortably on [place], however with significant inspiratory/expiratory wheezing and mild stridor. She was given a breathing treatment with improvement until transferred on to the floor with subsequent worsening.  A rapid response was called and she required BiPAP again with minimal to no air movement heard.  At that time I spoke with Dr. Tamala Julian, CCM, who accepted transfer to the ICU for close observation and considerations for intubation. Additionally heard back from ENT, Dr. Lucia Gaskins who she is seeing outpatient for this concern, who recommended transfer to [facility] for further direct laryngeal visualization.  Hopefully can work towards transfer to [facility] after she is stabilized.  She is now under primary care of CCM.    Glottic Edema  She required several days of intubation at the time and daughter in law is reporting the symptoms began  shortly after that hospitalization. She was recently admitted for this complaint (6/25-6/28). CT Neck at that time showed soft tissue fullness at the level of the glottis increased since 11/10/2019 with appearance similar to 10/30/2019.  She was discharged home on a steroid taper with an appointment to see an Allergist on 7/12. C3 complement, C4 complement, tryptase and C1 esterase inhibitor all within normal limits leads me to suspect that this is not an allergy mediated reaction and more likely a result of her recent stroke. ENT previously recommended tertiary ENT referral and potential need for direct laryngoscopy under general anesthesia..  - [place] O2 supplementation as needed, sats >90%, change to BiPAP if respiratory distress or saturations < 90% - Monitor airway closely - Duoneb 32mL  - Continue steroids  - ENT consult   - Consider speech eval once symptoms more stabilized  Hypothyroidism Most recent TSH 09/04/19, 8.96 - Continue Levothryoxine 88 mcg    A-fib- stable  Sinus rhythm on exam  - Cardiac monitor  - Continue Amiodarone 200 mg  - Continue Eliquis 5 mg  DM Most recent A1c 6.6. Home medication- metformin 500mg   - Continue to monitor - ssI as needed   HLD  Continue home medications - Lipitor 40 mg   FEN/GI: NPO Prophylaxis: Apixaban  Disposition: Inpatient  History of Present Illness:  Jamie Mitchell is a 74 y.o. female presenting with sudden onset shortness of breath.   Her daughter-in-law is at bedside and provides history. Reports she woke up this morning having difficulty breathing. She has had a few episodes similar to this, this is comparable to previous times. Daughter reports that patient does well on the high dose of steroid but whenever they switch to the medium  dose of steroid (20mg ), she has another flare. Feel like she initially started having throat problems approximately 2 weeks after she left the hospital from her CVA earlier this year. Patient has  had multiple ENT visits for this. Does not wear oxygen at home. She has pain in her throat currently, feels "real awful", consistent with previous episodes.   Of note, she was recently admitted on 6/25 for similar symptoms. She was discharged on abx, steroid taper and an Epi-pen. Had appointment with allergy scheduled for 7/12.  Ed Course: Initial saturation by EMS was 78% on room air. Patient received IM Epi, 10 albuterol and placed on BiPAP en route with significant improvement. She was weaned to 1L nasal cannula in ED.   Review Of Systems: Per HPI with the following additions:  Review of Systems  Constitutional: Negative for fever.  Respiratory: Positive for chest tightness, shortness of breath, wheezing and stridor.   Cardiovascular: Negative for chest pain.  Neurological: Positive for speech difficulty.     Patient Active Problem List   Diagnosis Date Noted  . Glottic edema 12/02/2019  . Prolonged PTT (partial thromboplastin time) 11/19/2019  . 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 Esophageal dysmotility 11/18/2019  . Gastroesophageal reflux 11/18/2019  . 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 Edema glottis 11/17/2019  . Stridor 10/31/2019  . SOB (shortness of breath) 10/30/2019  . Subglottic edema 10/30/2019  . Renal failure 07/25/2019  . Prediabetes 07/25/2019  . 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 Dysphagia 07/25/2019  . 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 Acute respiratory failure with hypoxemia ([place])   . Tachycardia-bradycardia syndrome ([place])   . Atrial fibrillation with RVR ([place])   . Hypertension 07/23/2015  . Hyperlipidemia 07/23/2015  . 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 Hypothyroidism 01/25/2014    Past Medical History: Past Medical History:   Diagnosis Date  . Atrial fibrillation ([place])   . 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 Hypertension   . Stroke ([place])   . Thyroid disease     Past Surgical History: Past Surgical History:  Procedure Laterality Date  . IR CT HEAD LTD  07/20/2019  . IR PERCUTANEOUS ART THROMBECTOMY/INFUSION INTRACRANIAL INC DIAG ANGIO  07/20/2019  . RADIOLOGY WITH ANESTHESIA N/A 07/20/2019   Procedure: IR WITH ANESTHESIA;  Surgeon: Luanne Bras, MD;  Location: [facility];  Service: Radiology;  Laterality: N/A;    Social History: Social History   Tobacco Use  . Smoking status: Never Smoker  . Smokeless tobacco: Never Used  Substance Use Topics  . Alcohol use: No  . Drug use: No     Please also refer to relevant sections of EMR.  Family History: Family History  Family history unknown: Yes    Allergies and Medications: No Known Allergies No current facility-administered medications on file prior to encounter.   Current Outpatient Medications on File Prior to Encounter  Medication Sig Dispense Refill  . albuterol (VENTOLIN HFA) 108 (90 Base) MCG/ACT inhaler Inhale 1-2 puffs into the lungs every 6 (six) hours as needed for wheezing or shortness of breath. 18 g 0  . amiodarone (PACERONE) 200 MG tablet Take 1 tablet (200 mg total) by mouth daily. 30 tablet 5  . amoxicillin (AMOXIL) 875 MG tablet Take by mouth 2 (two) times daily.    [name] apixaban (ELIQUIS)  5 MG TABS tablet Take 1 tablet (5 mg total) by mouth 2 (two) times daily. 90 tablet 3  . atorvastatin (LIPITOR) 40 MG tablet Take 1 tablet (40 mg total) by mouth daily. (Patient taking differently: 40 mg every evening. ) 30 tablet 0  . cetirizine (ZYRTEC ALLERGY) 10 MG tablet Take 1 tablet (10 mg total) by mouth 2 (two) times daily. 30 tablet 0  . chlorpheniramine-HYDROcodone (TUSSIONEX PENNKINETIC ER) 10-8 MG/5ML SUER Take 5 mLs by mouth every 12 (twelve) hours as needed for cough. 140 mL 0  . diclofenac Sodium (VOLTAREN) 1 % GEL Apply 2 g topically 4  (four) times daily. 100 g 3  . EPINEPHrine 0.3 mg/0.3 mL IJ SOAJ injection Inject 0.3 mLs (0.3 mg total) into the muscle as needed for anaphylaxis. 1 each 1  . famotidine (PEPCID) 20 MG tablet Take 1 tablet (20 mg total) by mouth 2 (two) times daily. 60 tablet 1  . levothyroxine (SYNTHROID) 88 MCG tablet Take 1 tablet (88 mcg total) by mouth daily before breakfast. 30 tablet 3  . metFORMIN (GLUCOPHAGE) 500 MG tablet Take 1 tablet (500 mg total) by mouth daily with breakfast. 30 tablet 6  . omeprazole (PRILOSEC) 40 MG capsule Take 40 mg by mouth daily.    . predniSONE (DELTASONE) 10 MG tablet Take 4 tablets (40 mg total) by mouth daily for 5 days, THEN 2 tablets (20 mg total) daily for 5 days, THEN 1 tablet (10 mg total) daily for 5 days, THEN 0.5 tablets (5 mg total) daily for 5 days. 20 tablet 0  . cetirizine (ZYRTEC ALLERGY) 10 MG tablet Take 1 tablet (10 mg total) by mouth daily. (Patient not taking: Reported on 12/02/2019) 30 tablet 2    Objective: BP (!) 128/94 (BP Location: Left Arm)   Pulse 84   Temp 98.2 F (36.8 C) (Oral)   Resp (!) 24   Ht 4\' 11"  (1.499 m)   Wt 65 kg   SpO2 99%   BMI 28.94 kg/m  Exam: General: mild distress, speech difficult 2/2 stroke Eyes: PEARLA, EOMI ENTM: patent airway, grade 2 mallampati,  no visible abscess or exudate Neck: supple, tenderness to left submandibular region Cardiovascular: RRR, no murmurs Respiratory: Audible and diffuse inspiratory and expiratory wheezes, on 1L [place], mild upper airway stridor, breathing comfortably without accessory muscle use, satting 98%  MSK: moves all extremities spontaneously  Psych: mood appropriate  Labs and Imaging: CBC BMET  Recent Labs  Lab 12/02/19 1021  WBC 12.4*  HGB 13.8  HCT 43.4  PLT 281   Recent Labs  Lab 12/02/19 1021  NA 139  K 4.0  CL 99  CO2 28  BUN 20  CREATININE 0.94  GLUCOSE 166*  CALCIUM 8.7*     EKG: Sinus rhythm at 70bpm, LAD, prolonged QT/QTc 560/605  CT Chest  7/10   Sharion Settler, DO 12/02/2019, 4:33 PM PGY-1, [facility] Intern pager: [phone], text pages welcome  FPTS Upper-Level Resident Addendum   I have independently interviewed and examined the patient. I have discussed the above with the original author and agree with their documentation. My edits for correction/addition/clarification are in green. Please see also any attending notes.    Patriciaann Clan, DO  Family Medicine PGY-3

## 2019-12-02 NOTE — Significant Event (Signed)
Rapid Response Event Note  Overview: Time Called: 1609 Arrival Time: 1611 Event Type: Respiratory Accessory muscle use noted to pt's upper chest and neck. Audible upper airway constriction.   Initial Focused Assessment: Pt lying in bed. Awake, alert. Pt is able to speak in short sentences- spanish speaking. Oxygen saturation 98% on 2LNC. Pt has upper chest and neck accessory muscle use. Diminished to little air movement heard throughout lung fields. Pt appears restless, attempting to position herself to breathe more comfortably. Skin is warm, dry to touch.   VS: BP 128/94, HR 80, RR 18, SpO2 97% on 2LNC  Pt placed on BiPAP 12/5, 40%. Pt breathing more comfortably. Lower lung fields remain diminished with little air movement heard. Oxygen saturation remains 98-100%. PCCM consulted and at bedside to evaluate pt. Order placed for transfer to ICU.   Interventions: -BiPAP, Albuterol neb per respiratory  Event Summary: Name of Physician Notified: Dr. Higinio Plan at 1615 Outcome: Transferred (Comment) (Transferred to ICU) Event End Time: [place]

## 2019-12-02 NOTE — Consult Note (Addendum)
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  History of present illness   74 year old woman with hx noted below presenting with rather sudden onset worsening dyspnea and wheezing.  Has recurrent admissions for this in the past month usually resolving with IV steroids, antibiotics.  She comes in for her 3rd episode of this.  Unfortunately worsened respiratory status on the floor despite BIPAP prompting ICU transfer.  Past Medical History  Stroke Afib on [facility] HTN Thyroid disease  Significant Hospital Events   7/9 admitted 7/10 ICU transfer  Consults:  ENT  Procedures:  N/A  Significant Diagnostic Tests:  CT neck 12/02/19  IMPRESSION: Motion degraded examination again showing a nonspecific edema pattern in the region of the glottis. This could be due to angio edema. Cannot rule out the possibility of a mass lesion, though this is not clearly defined. No evidence of adenopathy.  Micro Data:  COVID neg  Antimicrobials:  Unasyn 7/10>>   Interim history/subjective:  Consulted  Objective   Blood pressure (!) 128/94, pulse 84, temperature 98.2 F (36.8 C), temperature source Oral, resp. rate (!) 24, height 4\' 11"  (1.499 m), weight 65 kg, SpO2 99 %.    Vent Mode: BIPAP FiO2 (%):  [40 %-100 %] 40 % Set Rate:  [15 bmp] 15 bmp  No intake or output data in the 24 hours ending 12/02/19 1715 Filed Weights   12/02/19 0930  Weight: 65 kg    Examination: GEN: No acute distress HEENT: BIPAP in place with good seal, focal stridor over larynx worse on left CV: RRR, ext warm PULM: transmitted upper airway sounds, no accessory muscle use GI: Soft, +BS EXT: No edema NEURO: Moves all 4 ext to command PSYCH:  RASS 0 SKIN: No rashes  Renal function normal No eosinophilia Tryptase last admission normal    Resolved Hospital Problem list   N/A  Assessment & Plan:  Recurrent glottic edema after stroke in Feb.  Worsening flare-ups that respond to steroids.  Tryptase neg.  No other obvious signs of infection.  Prior speech workup noting delayed oral transit, question if she is getting mechanical irritation from this in glottis.   - IV steroids, PPI, unasyn - Will reach out to [facility], if they have ICU bed, will intubate and transfer, if not, supportive care here to hopefully avoid vent - Tracheostomy would be curative here  Mildly elevated trops- unclear why checked, echo earlier this year normal, probably stress induced, will take quick look with [place]  Afib- continue PTA eliquis/amiodarone, IV heparin if cannot tolerate PO  Addendum: after discussion with Dr. Carlis Abbott, we will hold apixiban and amiodarone as there are rare cases associated with angioedema and timing would fit.  Best practice:  Diet: NPO Pain/Anxiety/Delirium protocol (if indicated): N/A VAP protocol (if indicated): N/A DVT prophylaxis: on eliquis GI prophylaxis: PPI Glucose control: SSI Mobility: BR Code Status: Full Family Communication: updated son at bedside Disposition: ICU, see above  Labs   CBC: Recent Labs  Lab 12/02/19 0923 12/02/19 1021  WBC  --  12.4*  NEUTROABS  --  8.5*  HGB 14.3 13.8  HCT 42.0 43.4  MCV  --  89.7  PLT  --  281  Basic Metabolic Panel: Recent Labs  Lab 12/02/19 0923 12/02/19 1021  NA 136 139  K 3.3* 4.0  CL  --  99  CO2  --  28  GLUCOSE  --  166*  BUN  --  20  CREATININE  --  0.94  CALCIUM  --  8.7*   GFR: Estimated Creatinine Clearance: 45 mL/min (by C-G formula based on SCr of 0.94 mg/dL). Recent Labs  Lab 12/02/19 0925 12/02/19 1021 12/02/19 1230  WBC  --  12.4*  --   LATICACIDVEN 1.4  --  2.3*    Liver Function Tests: No results for input(s): AST, ALT, ALKPHOS,  BILITOT, PROT, ALBUMIN in the last 168 hours. No results for input(s): LIPASE, AMYLASE in the last 168 hours. No results for input(s): AMMONIA in the last 168 hours.  ABG    Component Value Date/Time   PHART 7.410 12/02/2019 0923   PCO2ART 51.7 (H) 12/02/2019 0923   PO2ART 461 (H) 12/02/2019 0923   HCO3 32.8 (H) 12/02/2019 0923   TCO2 34 (H) 12/02/2019 0923   ACIDBASEDEF 4.0 (H) 07/20/2019 0901   O2SAT 100.0 12/02/2019 0923     Coagulation Profile: No results for input(s): INR, PROTIME in the last 168 hours.  Cardiac Enzymes: No results for input(s): CKTOTAL, CKMB, CKMBINDEX, TROPONINI in the last 168 hours.  HbA1C: HbA1c, POC (controlled diabetic range)  Date/Time Value Ref Range Status  11/28/2019 11:06 AM 6.6 0.0 - 7.0 % Final   Hgb A1c MFr Bld  Date/Time Value Ref Range Status  07/20/2019 03:56 AM 6.1 (H) 4.8 - 5.6 % Final    Comment:    (NOTE) Pre diabetes:          5.7%-6.4% Diabetes:              >6.4% Glycemic control for   <7.0% adults with diabetes   07/20/2019 03:56 AM 6.0 (H) 4.8 - 5.6 % Final    Comment:    (NOTE) Pre diabetes:          5.7%-6.4% Diabetes:              >6.4% Glycemic control for   <7.0% adults with diabetes     CBG: No results for input(s): GLUCAP in the last 168 hours.  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   Surgical History    Past Surgical History:  Procedure Laterality Date  . IR CT HEAD LTD  07/20/2019  . IR PERCUTANEOUS ART THROMBECTOMY/INFUSION INTRACRANIAL INC DIAG ANGIO  07/20/2019  . RADIOLOGY WITH ANESTHESIA N/A 07/20/2019   Procedure: IR WITH ANESTHESIA;  Surgeon: Luanne Bras, MD;  Location: [place];  Service: Radiology;  Laterality: N/A;     Social History   reports that she has never smoked. She has never used  smokeless tobacco. She reports that she does not drink alcohol and does not use drugs.   Family History   Her Family history is unknown by patient.   Allergies No Known Allergies   Home Medications  Prior to Admission medications   Medication Sig Start Date End Date Taking? Authorizing Provider  albuterol (VENTOLIN HFA) 108 (90 Base) MCG/ACT inhaler Inhale 1-2 puffs into the lungs every 6 (six) hours as needed for wheezing or shortness of breath. 11/28/19  Yes Charlott Rakes, MD  amiodarone ([place])  200 MG tablet Take 1 tablet (200 mg total) by mouth daily. 09/14/19  Yes Donato Heinz, MD  amoxicillin (AMOXIL) 720 MG tablet Take by mouth 2 (two) times daily.   Yes [provider]  apixaban (ELIQUIS) 5 MG TABS tablet Take 1 tablet (5 mg total) by mouth 2 (two) times daily. 10/02/19  Yes Evans Lance, MD  atorvastatin (LIPITOR) 40 MG tablet Take 1 tablet (40 mg total) by mouth daily. Patient taking differently: 40 mg every evening.  08/02/19  Yes Angiulli, Lavon Paganini, PA-C  cetirizine (ZYRTEC ALLERGY) 10 MG tablet Take 1 tablet (10 mg total) by mouth 2 (two) times daily. 11/20/19  Yes Brimage, Ronnette Juniper, DO  chlorpheniramine-HYDROcodone (TUSSIONEX PENNKINETIC ER) 10-8 MG/5ML SUER Take 5 mLs by mouth every 12 (twelve) hours as needed for cough. 11/28/19  Yes Charlott Rakes, MD  diclofenac Sodium (VOLTAREN) 1 % GEL Apply 2 g topically 4 (four) times daily. 11/15/19  Yes Argentina Donovan, PA-C  EPINEPHrine 0.3 mg/0.3 mL IJ SOAJ injection Inject 0.3 mLs (0.3 mg total) into the muscle as needed for anaphylaxis. 11/20/19  Yes Brimage, Vondra, DO  famotidine (PEPCID) 20 MG tablet Take 1 tablet (20 mg total) by mouth 2 (two) times daily. 11/20/19 11/19/20 Yes Brimage, Ronnette Juniper, DO  levothyroxine (SYNTHROID) 88 MCG tablet Take 1 tablet (88 mcg total) by mouth daily before breakfast. 11/29/19  Yes Charlott Rakes, MD  metFORMIN (GLUCOPHAGE) 500 MG tablet Take 1 tablet (500 mg total) by mouth daily  with breakfast. 11/28/19  Yes Newlin, Enobong, MD  omeprazole (PRILOSEC) 40 MG capsule Take 40 mg by mouth daily.   Yes [provider]  predniSONE (DELTASONE) 10 MG tablet Take 4 tablets (40 mg total) by mouth daily for 5 days, THEN 2 tablets (20 mg total) daily for 5 days, THEN 1 tablet (10 mg total) daily for 5 days, THEN 0.5 tablets (5 mg total) daily for 5 days. 11/21/19 12/11/19 Yes Brimage, Ronnette Juniper, DO  cetirizine (ZYRTEC ALLERGY) 10 MG tablet Take 1 tablet (10 mg total) by mouth daily. Patient not taking: Reported on 12/02/2019 11/20/19 11/19/20  Lyndee Hensen, DO     Critical care time: 33 minutes

## 2019-12-02 NOTE — Progress Notes (Signed)
eLink Physician-Brief Progress Note Patient Name: Jamie Mitchell DOB: [birth year] MRN: [id number]   Date of Service  12/02/2019  HPI/Events of Note  Request for glucose checks. Last accucheck >200. Patient on methylprednisolone. Creatinine 0.94  eICU Interventions  Start moderate dose sliding scale for now and adjust accordingly.     Intervention Category Intermediate Interventions: Hyperglycemia - evaluation and treatment  Shona Needles Cyruss Arata 12/02/2019, 8:55 PM

## 2019-12-02 NOTE — Progress Notes (Signed)
Patient transported from 6E15 to 3M09 on Bipap with settings 12/5 40% O2.  Patient tolerated transport well.

## 2019-12-02 NOTE — ED Triage Notes (Signed)
Pt BIB [facility] with respiratory distress. Per family last episode occurred with seizure, no seizure noted this time. Initial sat 78% on room air, placed on BiPap by EMS. 138/98, 12 lead unremarkable.  Received IM Epi en route, 10 albuterol.

## 2019-12-02 NOTE — Progress Notes (Signed)
eLink Physician-Brief Progress Note Patient Name: Jamie Mitchell DOB: [birth year] MRN: [id number]   Date of Service  12/02/2019  HPI/Events of Note  Inquiry about need for BiPap overnight Patient transferred to ICU due to respiratory and need for BiPap. Seen tolerating BiPap.  eICU Interventions  Keep on BiPap overnight     Intervention Category Major Interventions: Respiratory failure - evaluation and management  Judd Lien 12/02/2019, 8:15 PM

## 2019-12-03 DIAGNOSIS — K219 Gastro-esophageal reflux disease without esophagitis: Secondary | ICD-10-CM | POA: Diagnosis present

## 2019-12-03 DIAGNOSIS — E785 Hyperlipidemia, unspecified: Secondary | ICD-10-CM | POA: Diagnosis present

## 2019-12-03 DIAGNOSIS — J384 Edema of larynx: Secondary | ICD-10-CM | POA: Diagnosis present

## 2019-12-03 DIAGNOSIS — Z7901 Long term (current) use of anticoagulants: Secondary | ICD-10-CM | POA: Diagnosis not present

## 2019-12-03 DIAGNOSIS — T783XXA Angioneurotic edema, initial encounter: Secondary | ICD-10-CM | POA: Diagnosis present

## 2019-12-03 DIAGNOSIS — Z7984 Long term (current) use of oral hypoglycemic drugs: Secondary | ICD-10-CM | POA: Diagnosis not present

## 2019-12-03 DIAGNOSIS — Z79899 Other long term (current) drug therapy: Secondary | ICD-10-CM | POA: Diagnosis not present

## 2019-12-03 DIAGNOSIS — I480 Paroxysmal atrial fibrillation: Secondary | ICD-10-CM | POA: Diagnosis present

## 2019-12-03 DIAGNOSIS — J9601 Acute respiratory failure with hypoxia: Secondary | ICD-10-CM | POA: Diagnosis present

## 2019-12-03 DIAGNOSIS — Z20822 Contact with and (suspected) exposure to covid-19: Secondary | ICD-10-CM | POA: Diagnosis present

## 2019-12-03 DIAGNOSIS — Z79890 Hormone replacement therapy (postmenopausal): Secondary | ICD-10-CM | POA: Diagnosis not present

## 2019-12-03 DIAGNOSIS — I69320 Aphasia following cerebral infarction: Secondary | ICD-10-CM | POA: Diagnosis not present

## 2019-12-03 DIAGNOSIS — E1165 Type 2 diabetes mellitus with hyperglycemia: Secondary | ICD-10-CM | POA: Diagnosis present

## 2019-12-03 DIAGNOSIS — I10 Essential (primary) hypertension: Secondary | ICD-10-CM | POA: Diagnosis present

## 2019-12-03 DIAGNOSIS — E039 Hypothyroidism, unspecified: Secondary | ICD-10-CM | POA: Diagnosis present

## 2019-12-03 LAB — BASIC METABOLIC PANEL
Anion gap: 11 (ref 5–15)
BUN: 15 mg/dL (ref 8–23)
CO2: 27 mmol/L (ref 22–32)
Calcium: 9 mg/dL (ref 8.9–10.3)
Chloride: 100 mmol/L (ref 98–111)
Creatinine, Ser: 0.9 mg/dL (ref 0.44–1.00)
GFR calc Af Amer: >60 mL/min (ref >60)
GFR calc non Af Amer: >60 mL/min (ref >60)
Glucose, Bld: 138 mg/dL — ABNORMAL HIGH (ref 70–99)
Potassium: 4.4 mmol/L (ref 3.5–5.1)
Sodium: 138 mmol/L (ref 135–145)

## 2019-12-03 LAB — CBC
HCT: 41.1 % (ref 36.0–46.0)
Hemoglobin: 13.4 g/dL (ref 12.0–15.0)
MCH: 28.9 pg (ref 26.0–34.0)
MCHC: 32.6 g/dL (ref 30.0–36.0)
MCV: 88.6 fL (ref 80.0–100.0)
Platelets: 285 10*3/uL (ref 150–400)
RBC: 4.64 MIL/uL (ref 3.87–5.11)
RDW: 16.5 % — ABNORMAL HIGH (ref 11.5–15.5)
WBC: 14.5 10*3/uL — ABNORMAL HIGH (ref 4.0–10.5)
nRBC: 0 % (ref 0.0–0.2)

## 2019-12-03 LAB — GLUCOSE, CAPILLARY
Glucose-Capillary: 123 mg/dL — ABNORMAL HIGH (ref 70–99)
Glucose-Capillary: 127 mg/dL — ABNORMAL HIGH (ref 70–99)
Glucose-Capillary: 185 mg/dL — ABNORMAL HIGH (ref 70–99)
Glucose-Capillary: 184 mg/dL — ABNORMAL HIGH (ref 70–99)
Glucose-Capillary: 215 mg/dL — ABNORMAL HIGH (ref 70–99)
Glucose-Capillary: 122 mg/dL — ABNORMAL HIGH (ref 70–99)

## 2019-12-03 MED ORDER — PANTOPRAZOLE SODIUM 40 MG PO TBEC
40.0000 mg | DELAYED_RELEASE_TABLET | Freq: Every day | ORAL | Status: DC
  Administered 2019-12-04: 40 mg via ORAL
  Filled 2019-12-03: qty 1

## 2019-12-03 MED ORDER — FAMOTIDINE 20 MG PO TABS
20.0000 mg | ORAL_TABLET | Freq: Two times a day (BID) | ORAL | Status: DC
  Administered 2019-12-03 – 2019-12-04 (×2): 20 mg via ORAL
  Filled 2019-12-03 (×2): qty 1

## 2019-12-03 MED ORDER — PREDNISONE 20 MG PO TABS: 20.0000 mg | ORAL_TABLET | Freq: Every day | ORAL | Status: DC

## 2019-12-03 MED ORDER — PREDNISONE 20 MG PO TABS
40.0000 mg | ORAL_TABLET | Freq: Every day | ORAL | Status: DC
  Administered 2019-12-03: 40 mg via ORAL
  Filled 2019-12-03: qty 2

## 2019-12-03 MED ORDER — ACETAMINOPHEN 325 MG PO TABS
650.0000 mg | ORAL_TABLET | Freq: Four times a day (QID) | ORAL | Status: DC | PRN
  Administered 2019-12-03: 650 mg via ORAL
  Filled 2019-12-03: qty 2

## 2019-12-03 MED ORDER — PREDNISONE 20 MG PO TABS
40.0000 mg | ORAL_TABLET | Freq: Every day | ORAL | Status: DC
  Administered 2019-12-04 (×2): 40 mg via ORAL
  Filled 2019-12-03 (×2): qty 2

## 2019-12-03 NOTE — Progress Notes (Signed)
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  History of present illness   74 year old woman with hx noted below presenting with rather sudden onset worsening dyspnea and wheezing.  Has recurrent admissions for this in the past month usually resolving with IV steroids, antibiotics.  She comes in for her 3rd episode of this.  Unfortunately worsened respiratory status on the floor despite BIPAP prompting ICU transfer.  Past Medical History  Stroke Afib on [facility] HTN Thyroid disease  Significant Hospital Events   7/9 admitted 7/10 ICU transfer 7/11 stridor and edema better.  Consults:  ENT  Procedures:  N/A  Significant Diagnostic Tests:  CT neck 12/02/19  IMPRESSION: Motion degraded examination again showing a nonspecific edema pattern in the region of the glottis. This could be due to angio edema. Cannot rule out the possibility of a mass lesion, though this is not clearly defined. No evidence of adenopathy.  Micro Data:  COVID neg  Antimicrobials:  Unasyn 7/10>>   Interim history/subjective:  Consulted  Objective   Blood pressure 123/73, pulse 65, temperature 98 F (36.7 C), temperature source Oral, resp. rate 16, height 4\' 11"  (1.499 m), weight 65 kg, SpO2 97 %.    Vent Mode: BIPAP FiO2 (%):  [40 %-100 %] 40 % Set Rate:  [15 bmp] 15 bmp   Intake/Output Summary (Last 24 hours) at 12/03/2019 7564 Last data filed at 12/03/2019 0800 Gross per 24 hour  Intake 470.12 ml  Output 900 ml  Net -429.88 ml   Filed Weights   12/02/19 0930  Weight: 65 kg    Examination: General: Pleasant 74 year old female resting in bed she is in no acute distress this morning removed from BiPAP HEENT: She still has  some mild inspiratory stridor when she talks however no angioedema noted, and breathing is much improved Pulmonary: Clear to auscultation, no accessory use currently Cardiac regular irregular.  Atrial fibrillation noted on telemetry Abdomen soft nontender no organomegaly Extremities are warm and dry with brisk capillary refill Neuro awake oriented, no weakness noted GU voids    Resolved Hospital Problem list   N/A  Assessment & Plan:  Recurrent glottic edema after stroke in Feb.  Worsening flare-ups that respond to steroids.  Tryptase neg.  No other obvious signs of infection.  On further discussion 1 possibility could be angioedema in the setting of apixaban and/or amiodarone.  Timing would fit. Plan Prednisone taper Discontinue Eliquis and amiodarone We will [place] LMWH for today, with plan to start warfarin 7/12 assuming no further issues with airway Discontinue amiodarone Rate control with Lopressor Okay to discontinue BiPAP Out of bed Pulse oximetry Transfer to progressive care If this happens again she may require tracheostomy Dc unasyn  Cont H2B  Afib Plan As mentioned above discontinuing Eliquis and amiodarone indefinitely given concern for possible angioedema Will need to start warfarin on 7/12 assuming no further airway concern We will address rate control with Lopressor  Hypothyroidism Plan Cont replacement    Best practice:  Diet: NPO Pain/Anxiety/Delirium protocol (if indicated): N/A VAP protocol (if indicated): N/A DVT prophylaxis: on eliquis GI prophylaxis: PPI Glucose control: SSI Mobility: BR Code Status: Full Family Communication: updated son at bedside Disposition:to progressive  Critical care time: 32 min     Erick Colace ACNP-BC [place] Pager # [phone] OR # [phone] if no answer

## 2019-12-03 NOTE — Hospital Course (Addendum)
Recurrent Angioedema of glottis- Patient admitted for acute hypoxic respiratory failure in the setting of presumed recurrent angioedema/subglottic edema of unknown etiology.  This recurrent episodes initially started shortly after her admission in 06/2019 for acute CVA and new onset atrial fibrillation, ?if related to intubation vs medication AE started during this time. (amiodarone and eliquis have been discontinued in the event that these medications have contributed to her symptoms). On arrival, she received IV solu-medrol, epi, and required BiPAP with significant improvement.  CTA ruled out PE or infiltrate.  During our evaluation, she was breathing comfortably on [place], however with significant inspiratory/expiratory wheezing and mild stridor. She was given a breathing treatment with improvement until transferred on to the floor with subsequent worsening.  A rapid response was called and she required BiPAP again with minimal to no air movement heard.  At that time I spoke with Dr. Tamala Julian, CCM, who accepted transfer to the ICU for close observation and considerations for intubation. Additionally heard back from ENT, Dr. Lucia Gaskins who she is seeing outpatient for this concern, who recommended transfer to [facility] for further direct laryngeal visualization. Patient was transferred back to our care once she was mores stabilized. Has outpatient ENT appointment scheduled with [facility] on 7/13 at 3 PM.  Afib- amiodarone and eliquis discontinued due to rare cases involving angioedema. Sinus rhythm through admission. Patient discharged on Lovenox injectables until seen with cardiology/ENT for follow up and medication adjustment.   Type 2 Diabetes Mellitus A1c 7.1, previously 6 on 07/20/19. Home medications only notable for 500 mg Metformin. CBG's on admission ranged 122-262. Metformin increased to 1000 mg on discharge.   Metformin increased from 500 mg to 1000 mg. Follow up with PCP for possible  adjustment Follow up with cardiology. Eliquis and amiodarone stopped due to potential cause for angioedema. Discharged on Lovenox. Please follow up for medication adjustment, EKG. Appointment with [facility]NT 7/13 at 3 PM for direct visual laryngoscopy

## 2019-12-04 ENCOUNTER — Telehealth: Payer: Self-pay | Admitting: Allergy

## 2019-12-04 ENCOUNTER — Other Ambulatory Visit: Payer: Self-pay | Admitting: *Deleted

## 2019-12-04 ENCOUNTER — Ambulatory Visit: Payer: Medicare Other | Admitting: Allergy

## 2019-12-04 ENCOUNTER — Ambulatory Visit: Payer: Medicare Other | Admitting: Family Medicine

## 2019-12-04 DIAGNOSIS — J9601 Acute respiratory failure with hypoxia: Secondary | ICD-10-CM

## 2019-12-04 LAB — GLUCOSE, CAPILLARY
Glucose-Capillary: 138 mg/dL — ABNORMAL HIGH (ref 70–99)
Glucose-Capillary: 262 mg/dL — ABNORMAL HIGH (ref 70–99)
Glucose-Capillary: 130 mg/dL — ABNORMAL HIGH (ref 70–99)
Glucose-Capillary: 183 mg/dL — ABNORMAL HIGH (ref 70–99)

## 2019-12-04 LAB — CBC
HCT: 39.9 % (ref 36.0–46.0)
Hemoglobin: 13.5 g/dL (ref 12.0–15.0)
MCH: 29.4 pg (ref 26.0–34.0)
MCHC: 33.8 g/dL (ref 30.0–36.0)
MCV: 86.9 fL (ref 80.0–100.0)
Platelets: 306 10*3/uL (ref 150–400)
RBC: 4.59 MIL/uL (ref 3.87–5.11)
RDW: 16.3 % — ABNORMAL HIGH (ref 11.5–15.5)
WBC: 20 10*3/uL — ABNORMAL HIGH (ref 4.0–10.5)
nRBC: 0 % (ref 0.0–0.2)

## 2019-12-04 LAB — BASIC METABOLIC PANEL
Anion gap: 12 (ref 5–15)
BUN: 14 mg/dL (ref 8–23)
CO2: 24 mmol/L (ref 22–32)
Calcium: 8.5 mg/dL — ABNORMAL LOW (ref 8.9–10.3)
Chloride: 98 mmol/L (ref 98–111)
Creatinine, Ser: 0.91 mg/dL (ref 0.44–1.00)
GFR calc Af Amer: >60 mL/min (ref >60)
GFR calc non Af Amer: >60 mL/min (ref >60)
Glucose, Bld: 130 mg/dL — ABNORMAL HIGH (ref 70–99)
Potassium: 4.2 mmol/L (ref 3.5–5.1)
Sodium: 134 mmol/L — ABNORMAL LOW (ref 135–145)

## 2019-12-04 MED ORDER — ENOXAPARIN (LOVENOX) PATIENT EDUCATION KIT
PACK | Freq: Once | Status: AC
  Filled 2019-12-04: qty 1

## 2019-12-04 MED ORDER — PREDNISONE 20 MG PO TABS: 40.0000 mg | ORAL_TABLET | Freq: Every day | ORAL | 0 refills | Status: DC

## 2019-12-04 MED ORDER — METFORMIN HCL 500 MG PO TABS: 500.0000 mg | ORAL_TABLET | Freq: Two times a day (BID) | ORAL | 0 refills | Status: DC

## 2019-12-04 MED ORDER — ENOXAPARIN SODIUM 60 MG/0.6ML ~~LOC~~ SOLN
60.0000 mg | Freq: Two times a day (BID) | SUBCUTANEOUS | 0 refills | Status: DC
Start: 2019-12-04 — End: 2020-01-04

## 2019-12-04 MED ORDER — METFORMIN HCL 500 MG PO TABS
500.0000 mg | ORAL_TABLET | Freq: Two times a day (BID) | ORAL | Status: DC
  Administered 2019-12-04: 500 mg via ORAL
  Filled 2019-12-04: qty 1

## 2019-12-04 NOTE — Discharge Summary (Addendum)
[facility] Discharge Summary  Patient name: Jamie Mitchell Medical record number: [id number] Date of birth: [birth year] Age: 74 y.o. Gender: female Date of Admission: 12/02/2019  Date of Discharge: 2988 Admitting Physician: Sharion Settler, DO  Primary Care Provider: Charlott Rakes, MD Consultants: ENT, CCM  Indication for Hospitalization: Recurrent angioedema of glottis  Discharge Diagnoses/Problem List:   Recurrent Angioedema of glottis Atrial Fibrillation T2DM Hypothyroidism HLD  Disposition: Home with [facility] ENT follow up 7/13   Discharge Condition: Stable  Discharge Exam:  Temp:  [97.9 F (36.6 C)-98.2 F (36.8 C)] 97.9 F (36.6 C) (07/12 0332) Pulse Rate:  [59-67] 59 (07/12 0332) Resp:  [16-21] 17 (07/12 0332) BP: (108-128)/(64-103) 124/79 (07/12 0332) SpO2:  [93 %-98 %] 96 % (07/12 0332) Physical Exam: General: Resting in bed, accompanied by son. No distress HEENT: tenderness to palpation bilateral submandibular region, no edema or erythema Cardiovascular: RRR, no murmurs  Respiratory: No accessory muscle use, no wheezing/stridor appreciated, satting >95% on room air Abdomen: mild tenderness in all quadrants, soft, non distended, no rebound or guarding, normoactive bowel sounds Extremities: no edema, 2+ PT and radial pulses   Brief Hospital Course:  Patient admitted for acute hypoxic respiratory failure in the setting of presumed recurrent angioedema/subglottic edema of unknown etiology.  This recurrent episodes initially started shortly after her admission in 06/2019 for acute CVA and new onset atrial fibrillation, unsure if related to intubation vs medication AE started during this time. (amiodarone and eliquis have been discontinued in the event that these medications have contributed to her symptoms). On arrival, she received IV solu-medrol, epi, and required BiPAP with significant improvement.  CTA ruled out  PE or infiltrate.  During our evaluation, she was breathing comfortably on [place], however with significant inspiratory/expiratory wheezing and mild stridor. She was given a breathing treatment with improvement until transferred on to the floor with subsequent worsening.  A rapid response was called and she required BiPAP again with minimal to no air movement heard.  At that time I spoke with Dr. Tamala Julian, CCM, who accepted transfer to the ICU for close observation and considerations for intubation. Additionally heard back from ENT, Dr. Lucia Gaskins who she is seeing outpatient for this concern, who recommended transfer to [facility] for further direct laryngeal visualization. Patient was transferred back to our care when stable. Family states that she does well on high doses of Prednisone but worsens once she begins to taper. Has outpatient ENT appointment scheduled with [facility] ENT on 7/13 at 3 PM. Was continued on 40 mg of prednisone at discharge.  Afib- amiodarone and eliquis discontinued due to rare cases involving angioedema. Sinus rhythm through admission. Patient discharged on Lovenox injectables until seen with cardiology/ENT for follow up and medication adjustment.   Type 2 Diabetes Mellitus A1c 7.1, previously 6 on 07/20/19. Home medications only notable for 500 mg Metformin. CBG's on admission ranged 122-262. Metformin increased to 1000 mg on discharge.  All other chronic conditions stable and home medications continued.  Issues for Follow Up:  1. Metformin increased from 500 mg to 1000 mg. Follow up with PCP for possible adjustment 2. Follow up with cardiology. Eliquis and amiodarone stopped due to potential cause for angioedema. Discharged on Lovenox. Please follow up for medication adjustment, EKG. 3. Appointment with [facility] ENT 7/13 at 3 PM for direct visual laryngoscopy   Significant Procedures: None  Significant Labs and Imaging:  Recent Labs  Lab 12/02/19 1021  12/03/19  0406 12/04/19 0335  WBC 12.4* 14.5* 20.0*  HGB 13.8 13.4 13.5  HCT 43.4 41.1 39.9  PLT 281 285 306   Recent Labs  Lab 12/02/19 0923 12/02/19 0923 12/02/19 1021 12/02/19 1021 12/03/19 0406 12/04/19 0335  NA 136  --  139  --  138 134*  K 3.3*   < > 4.0   < > 4.4 4.2  CL  --   --  99  --  100 98  CO2  --   --  28  --  27 24  GLUCOSE  --   --  166*  --  138* 130*  BUN  --   --  20  --  15 14  CREATININE  --   --  0.94  --  0.90 0.91  CALCIUM  --   --  8.7*  --  9.0 8.5*   < > = values in this interval not displayed.   CT Soft Tissue Neck with contrast 7/10 IMPRESSION: Motion degraded examination again showing a nonspecific edema pattern in the region of the glottis. This could be due to angio edema. Cannot rule out the possibility of a mass lesion, though this is not clearly defined. No evidence of adenopathy.  CT Angio Chest PE with and/or without Contrast 7/10 IMPRESSION: 1. Evaluation of lower lobe pulmonary arteries is mildly limited, particularly on the right, due to respiratory motion. Within these limitations, no pulmonary emboli identified. 2. High attenuation layering in the gallbladder could represent sludge or stones. 3. No other abnormalities.  Chest x-ray 7/10 IMPRESSION: No acute cardiopulmonary disease.   Results/Tests Pending at Time of Discharge: None  Discharge Medications:  Allergies as of 12/04/2019   No Known Allergies     Medication List    STOP taking these medications   amiodarone 200 MG tablet Commonly known as: PACERONE   apixaban 5 MG Tabs tablet Commonly known as: ELIQUIS     TAKE these medications   albuterol 108 (90 Base) MCG/ACT inhaler Commonly known as: VENTOLIN HFA Inhale 1-2 puffs into the lungs every 6 (six) hours as needed for wheezing or shortness of breath.   amoxicillin 875 MG tablet Commonly known as: AMOXIL Take by mouth 2 (two) times daily.   atorvastatin 40 MG tablet Commonly known as:  LIPITOR Take 1 tablet (40 mg total) by mouth daily. What changed:   how to take this  when to take this   cetirizine 10 MG tablet Commonly known as: ZyrTEC Allergy Take 1 tablet (10 mg total) by mouth 2 (two) times daily.   chlorpheniramine-HYDROcodone 10-8 MG/5ML Suer Commonly known as: Tussionex Pennkinetic ER Take 5 mLs by mouth every 12 (twelve) hours as needed for cough.   diclofenac Sodium 1 % Gel Commonly known as: Voltaren Apply 2 g topically 4 (four) times daily.   enoxaparin 60 MG/0.6ML injection Commonly known as: LOVENOX Inject 0.6 mLs (60 mg total) into the skin 2 (two) times daily.   EPINEPHrine 0.3 mg/0.3 mL Soaj injection Commonly known as: EPI-PEN Inject 0.3 mLs (0.3 mg total) into the muscle as needed for anaphylaxis.   famotidine 20 MG tablet Commonly known as: PEPCID Take 1 tablet (20 mg total) by mouth 2 (two) times daily.   levothyroxine 88 MCG tablet Commonly known as: SYNTHROID Take 1 tablet (88 mcg total) by mouth daily before breakfast.   metFORMIN 500 MG tablet Commonly known as: GLUCOPHAGE Take 1 tablet (500 mg total) by mouth 2 (two) times daily with a  meal. What changed: when to take this   omeprazole 40 MG capsule Commonly known as: PRILOSEC Take 40 mg by mouth daily.   predniSONE 20 MG tablet Commonly known as: DELTASONE Take 2 tablets (40 mg total) by mouth daily with breakfast. Start taking on: December 05, 2019 What changed:   medication strength  See the new instructions.       Discharge Instructions: Please refer to Patient Instructions section of EMR for full details.  Patient was counseled important signs and symptoms that should prompt return to medical care, changes in medications, dietary instructions, activity restrictions, and follow up appointments.   Follow-Up Appointments:  Follow-up Information    Charlott Rakes, MD. Schedule an appointment as soon as possible for a visit in 3 day(s).   Specialty: Family  Medicine Why: hospital follow up Contact information: [place] [address] [phone]        Donato Heinz, MD .   Specialty: Cardiology Contact information: [address] [place] [address] [phone]               Sharion Settler, DO 12/04/2019, 3:45 PM PGY-1, Grayling Autry-Lott, DO 12/05/2019, 9:45 AM PGY-2, [place]

## 2019-12-04 NOTE — Progress Notes (Addendum)
Spoke with front office and nurse from ENT at [facility] to try and schedule outpatient follow up. They do not have availability until 7/28 but will talk to scheduler to see if patient is able to be fit in within the next week. Will update hopefully by 5 pm today or latest by noon tomorrow. Has number to call back here, as well as patient and sons number.

## 2019-12-04 NOTE — Telephone Encounter (Signed)
Patient is currently inpatient at [facility]. ?ICU? Will follow up later.

## 2019-12-04 NOTE — Patient Outreach (Signed)
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  No patient outreach at this time due to hospitalization.   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  No patient outreach at this time due to hospitalization.  Please follow up for discharge planning / disposition.  RNCM will follow up post discharge if patient hospitalized less than 10 days or will close patient's case if hospitalized  greater than 10 days.  Thanks,  Amalia Greenhouse H. Annia Friendly, BSN, [place] Management [facility] Telephonic CM Phone: [phone] Fax: [phone]

## 2019-12-04 NOTE — Telephone Encounter (Signed)
Please call patient as patient no showed to today's 9AM appointment. Looks like she is admitted in the hospital.   Please make sure she reschedules her appointment.   Thank you.

## 2019-12-04 NOTE — Progress Notes (Signed)
Pt off BiPAP at this time. Pt tolerating being off well. RT will continue to monitor.

## 2019-12-04 NOTE — Discharge Instructions (Addendum)
Enoxaparin injection Qu es este medicamento? La ENOXAPARINA se utiliza despus de una operacin de rodilla, cadera o abdomen para evitar la coagulacin. Tambin se [place] para tratar cogulos sanguneos que existen en los pulmones o en las venas. Este medicamento puede ser utilizado para otros usos; si tiene alguna pregunta consulte con su proveedor de atencin mdica o con su farmacutico. MARCAS COMUNES: Lovenox  Qu le debo informar a mi profesional de la salud antes de tomar este medicamento? Necesita saber si usted presenta alguno de los [employer] o situaciones:  trastornos de sangrado, hemorragia o hemofilia  infeccin del corazn o de las vlvulas cardacas  enfermedad renal o heptica  derrame cerebral previo  prtesis de vlvula cardiaca  ciruga o parto reciente  lcera estomacal o intestinal, diverticulitis u otras enfermedades intestinales  una reaccin alrgica o inusual a la enoxaparina, heparina, carne de cerdo o productos porcinos, otros medicamentos, alimentos, colorantes o conservantes  si est embarazada o buscando quedar embarazada  si est amamantando a un beb  Cmo debo utilizar este medicamento? Este medicamento se administra mediante inyeccin por va subcutnea. Generalmente lo [place] un profesional de KB Home	[place]. Usted o un miembro de la familia puede capacitarse para Research officer, political party las inyecciones. Si va a administrarse inyecciones usted mismo, asegrese de comprender cmo se utiliza la jeringa, cmo se mide la dosis en caso de ser necesario, cmo se administra la inyeccin. No friccione el lugar de la inyeccin de este medicamento de esta forma Visual merchandiser. No use su medicamento con una frecuencia mayor a la indicada. No deje de usarlo excepto si as lo indica su mdico o su profesional de KB Home	[place]. Asegrese de recibir un recipiente resistente a los pinchazos para Comptroller las agujas y las jeringas una vez que las [place]. No vuelva a usar estos elementos. Devuelva el recipiente a su mdico o a su profesional de la salud para que lo deseche de [place]. Hable con su pediatra para informarse acerca del uso de este medicamento en nios. Puede requerir atencin especial. Sobredosis: Pngase en contacto inmediatamente con un centro toxicolgico o una sala de urgencia si usted cree que haya tomado demasiado medicamento. ATENCIN: [employer] es solo para usted. No comparta este medicamento con nadie.  Qu sucede si me olvido de una dosis? Si olvida una dosis, sela lo antes posible. Si es casi la hora de la prxima dosis, use slo esa dosis. No use dosis adicionales o dobles.  Qu puede interactuar con este medicamento?  aspirina y medicamentos tipo aspirina  ciertos medicamentos que tratan o previenen cogulos sanguneos  dipiridamol  los [place], medicamentos para Conservation officer, historic buildings y inflamacin, como ibuprofeno o naproxeno Puede ser que esta lista no menciona todas las posibles interacciones. Informe a su profesional de KB Home	[place] de [employer] productos a base de hierbas, medicamentos de [place] o suplementos nutritivos que est tomando. Si usted fuma, consume bebidas alcohlicas o si utiliza drogas ilegales, indqueselo tambin a su profesional de KB Home	[place]. Algunas sustancias pueden interactuar con su medicamento.  A qu debo estar atento al usar [employer]? Visite a su profesional de la salud para que revise regularmente su evolucin. Usted podra necesitar realizarse [employer] de sangre mientras est usando [place]. Se supervisar su estado de salud atentamente mientras reciba este medicamento. Es importante no faltar a ninguna cita. Si va a someterse a una operacin o a otro procedimiento, informe a su profesional de la salud que est [employer]. Usar [place] [employer]  medicamento por un periodo prolongado puede debilitar sus huesos y aumentar su riesgo de sufrir Oceanographer. Evite los deportes y actividades que podran causar una lesin mientras est usando este medicamento. Las lesiones o cadas graves pueden causar un sangrado oculto. Tenga cuidado al usar herramientas filosas o cuchillos. Considere usar Forensic scientist. Tenga especial cuidado al [employer] o usar hilo dental. Informe cualquier lesin, hematoma, o puntos rojos en la piel a su profesional de KB Home	[place]. Use un brazalete o una cadena de identificacin mdica. Lleve con usted una tarjeta que describa su enfermedad, detalles de su medicamento y horario de dosis.  Qu efectos secundarios puedo tener al [employer] este medicamento? Efectos secundarios que debe informar a su mdico o a Barrister's clerk de la salud tan pronto como sea posible: Chief of Staff, como erupcin cutnea, comezn/picazn o urticaria, e hinchazn de la cara, los labios o la lengua dolor de huesos signos y sntomas de Teacher, music, tales como heces con sangre o de color negro y Curator alquitranado; [place] de color rojo o marrn oscuro; escupir sangre o material marrn que tiene el aspecto de granos de caf molido; Tree surgeon rojas en la piel; sangrado o moretones inusuales en los ojos, las encas o la nariz signos y sntomas de un cogulo sanguneo, tales como dolor en el pecho; falta de aire; dolor, hinchazn o calor en la pierna signos y sntomas de un accidente cerebrovascular, tales como cambios en la visin; confusin; dificultad para hablar o entender; dolores de cabeza severos; entumecimiento o debilidad repentina de la cara, el brazo o la pierna; problemas al caminar; Chief of Staff; prdida de la coordinacin Efectos secundarios que generalmente no requieren atencin mdica (infrmelos a su mdico o a Barrister's clerk de la salud si persisten o si son molestos): cada del Programmer, multimedia, enrojecimiento o Actor de la inyeccin Puede ser que esta lista no menciona todos los posibles efectos secundarios.  Comunquese a su mdico por asesoramiento mdico [employer] Inc. Usted puede informar los efectos secundarios a la FDA por telfono al 1-800-FDA-1088.  Dnde debo guardar mi medicina? Mantngala fuera del alcance de los nios. Gurdela a [employer], entre 15 y 23 grados C (52 y 42 grados F). No la congele. Si las inyecciones fueron preparadas especialmente, tal vez sea necesario guardarlas en el refrigerador. Consulte a Midwife. Deseche todo el medicamento que no haya utilizado, despus de la fecha de vencimiento. ATENCIN: Este folleto es un resumen. Puede ser que no cubra toda la posible informacin. Si usted tiene preguntas acerca de esta medicina, consulte con su mdico, su farmacutico o su profesional de Technical sales engineer.  2020 Elsevier/Gold Standard (2017-08-12 00:00:00)  ====================================================================================  Instrucciones para la aplicacin de una inyeccin anticoagulante con Clent Jacks precargada Anticoagulant Injection Instructions Using a Prefilled Syringe   Los anticoagulantes inyectables son medicamentos que ayudan a Publishing rights manager formacin de cogulos de [place]. Dos medicamentos anticoagulantes inyectables comunes que se usan son fondaparinux y enoxaparina. Los medicamentos anticoagulantes inyectables se aplican con una jeringa que se [place] una sola vez y que ya contiene en su interior el medicamento (jeringa precargada). Usted se inyecta el medicamento con una aguja en la capa de tejido graso entre la piel y el msculo (va subcutnea) en el abdomen. Antes de la primera inyeccin, el mdico le indicar cmo administrarse el medicamento anticoagulante en su casa. Tambin lea la gua del medicamento o el prospecto que trae la jeringa precargada. [place] de la gua sobre  cmo preparar y aplicar la inyeccin.  Cules son los riesgos? Generalmente, la inyeccin autoadministrada de medicamento  anticoagulante es segura. No obstante, se pueden presentar problemas leves, como sangrado leve, prurito o erupcin en el sitio de la inyeccin. Otros riesgos pueden incluir:  Sangrado y hematomas.  Recuento bajo de plaquetas (trombocitopenia).  Reaccin alrgica al medicamento.  Dao heptico.  Recuento bajo de glbulos rojos (anemia).  Materiales necesarios: Armed forces operational officer, necesitar:  Toallitas impregnadas en alcohol.  Clent Jacks precargada con aguja.  Un contenedor para Water quality scientist. Este puede ser un recipiente para objetos punzantes resistente a punciones o un contenedor de plstico resistente que tenga una tapa, como una botella vaca de detergente para ropa.  Cmo inyectar el medicamento anticoagulante 1. Lvese las manos con agua y [place]. Use desinfectante para manos con alcohol si no dispone de [place] y [place]. 2. Ubique el lugar en el abdomen donde debera inyectarse el medicamento. Evite la zona que est a 2 pulgadas (5cm) del ombligo. 3. Limpie el lugar donde aplicar la inyeccin con un pao con alcohol. Deje que el lugar se seque al aire. 4. Retire la cubierta plstica de la aguja que est en la [place]. No deje que la aguja toque nada. 5. Sostenga la jeringa con una mano y, con la otra, gire el protector rgido de la aguja (que cubre la aguja) en sentido contrario a las agujas del [place]. Retire Surveyor, mining rgido de Biomedical scientist. Deseche el protector de la aguja. 6. Cuando use una jeringa precargada, no haga salir la burbuja de aire de la jeringa antes de la inyeccin. La burbuja de aire ayudar a extraer todo el medicamento de la [place] y llevarlo al tejido subcutneo. 7. Sostenga la jeringa como un lpiz, con la mano con la que escribe. 8. Use la otra mano para pellizcar y sujetar aproximadamente una pulgada (2,5cm) de piel. No toque directamente la zona limpia de la piel. 9. Inserte toda la aguja directamente en el pliegue de piel. La aguja debe  estar en un ngulo de 90 grados (perpendicular) a la piel. Empuje toda la aguja hacia la piel. 10. Despus de haber introducido completamente la aguja en la piel, libere la piel que estaba pellizcando. Contine sosteniendo la jeringa con la mano con la que escribe. 11. Empuje el mbolo hasta el fondo de la jeringa con el pulgar o el dedo ndice de la mano con la que escribe para Nurse, children's. 12. Para extraer la aguja, tire en forma recta. 9. Presione y sostenga una toallita impregnada en alcohol sobre el sitio de la inyeccin hasta detener el sangrado. No frote la zona. 14. Cubra el sitio de la inyeccin con un apsito, si es necesario. 15. No tape la aguja nuevamente. 16. Si la jeringa tiene un sistema de seguridad para proteger la aguja despus de la inyeccin: ? Empuje el mbolo hacia abajo con firmeza despus de haber completado la inyeccin. La cubierta protectora cubrir automticamente la aguja y Pharmacist, community un chasquido. El chasquido significa que la aguja est cubierta de [place] segura. [place] y la aguja en el contenedor para desechos.  Qu ms necesito saber?  No use la jeringa ni la aguja ms de [employer].  Debe cambiar el sitio de la inyeccin cada vez que se la aplica.  Antes de una inyeccin, debe asegurarse de que el medicamento se vea transparente e incoloro o de color amarillo plido. No use el medicamento si cambi de color o  tiene partculas. Infrmele al mdico de inmediato.  Informe a todos sus mdicos, incluido el dentista: ? Que est en tratamiento con un anticoagulante, especialmente si se lesiona o tiene programado realizarse un procedimiento. ? Si se produce un cambio en su enfermedad, y cualquier otro cambio en los medicamentos, suplementos o dieta.  Tome los medicamentos de venta libre y los recetados solamente como se lo haya indicado el mdico.  Mantenga el medicamento guardado en un lugar seguro a Engineer, water.  Concurra a  todas las visitas de control como se lo haya indicado el mdico.  Comunquese con un mdico si:  Tiene alguna erupcin cutnea.  Tiene grandes zonas de hematomas en la piel.  La afeccin empeora.  Presenta fiebre.  Hay sangre en la orina.  Sangra por las encas.  Solicite ayuda de inmediato si:  Tiene problemas hemorrgicos tales como: ? Vomita o Theme park manager. ? Sangre de color rojo oscuro en la orina. ? Presenta sangre en las heces o estas son oscuras, alquitranadas o su aspecto es similar al de los granos de caf.  Tiene sangrado que no se detiene.  Tiene dolor de pecho o le falta el aire.  Siente un dolor de cabeza intenso o confusin.  Estos sntomas pueden representar un problema grave que constituye Engineer, maintenance (IT). No espere hasta que los sntomas desaparezcan. Solicite atencin mdica de inmediato. Comunquese con el servicio de emergencias de su localidad (911 en los [place]). No conduzca por sus propios medios [employer] hospital. Resumen  Los anticoagulantes inyectables son medicamentos que ayudan a Publishing rights manager formacin de cogulos de Biochemist, clinical en las venas.  Usted se inyecta el medicamento con una aguja en la capa de tejido graso entre la piel y el msculo (va subcutnea) en el abdomen.  Concurra a todas las visitas de control como se lo haya indicado el mdico. Las visitas de control incluyen visitas para anlisis de laboratorio. Esta informacin no tiene Marine scientist el consejo del mdico. Asegrese de hacerle al mdico cualquier pregunta que tenga. Document Revised: 08/21/2017 Document Reviewed: 08/21/2017 Elsevier Patient Education  2020 [place].    Dear Jamie Mitchell,  POST-HOSPITAL & CARE INSTRUCTIONS 1. Continue Prednisone 40 mg daily 2. Take Metformin 500 mg twice daily 3. Take Lovenox injections as above. 4. Go to your follow up appointments: 12/05/19 at [facility] @ 3pm Address: [address],  [place], [place] [address] Phone: [phone]   DOCTOR'S APPOINTMENT   Future Appointments  Date Time Provider [place]  12/13/2019  2:20 PM GI-[facility]G MM 2 GI-BCGMM GI-[facility]  12/21/2019  2:45 PM Frann Rider, NP GNA-GNA None  12/26/2019 12:00 PM Serena Colonel, RN THN-CCC None  01/01/2020  9:50 AM Charlott Rakes, MD CHW-CHWW None  04/29/2020  2:20 PM Donato Heinz, MD [facility]     Take care and be well!  [facility]  [place], [place] [address] [phone]

## 2019-12-04 NOTE — Progress Notes (Signed)
Patient scheduled with [facility] ENT tomorrow at 3 PM. Will need ED notes/hospitalization notes faxed to [phone]

## 2019-12-04 NOTE — Consult Note (Signed)
   [facility] CM Inpatient Consult   12/04/2019  Jamie Mitchell 06-Feb-1946 [id number]   Patient on active [place] Management list this morning, also noted [facility] RN Telephonic Care Coordinator, Darlene Cooper's noted and aware of hospitalization.  Patient is currently active with [facility] for chronic disease management services.  Patient has been engaged by a [place] Management Coordinator.  Our community based plan of care has focused on disease management and community resource support.    Patient will receive a post hospital call and will be evaluated for assessments and disease process education.    Plan: Will follow up with Inpatient Transition Of Care [TOC] team member to make aware that [facility] Management following. Of note, [facility] Care Management services does not replace or interfere with any services that are needed or arranged by inpatient [facility] care management team.  For additional questions or referrals please contact:  Natividad Brood, RN BSN [facility] Liaison  [phone] business mobile phone Toll free office [phone]  Fax number: [phone] [email or web address]

## 2019-12-04 NOTE — Progress Notes (Deleted)
New Patient Note  RE: Jamie Mitchell MRN: [id number] DOB: [birth year] Date of Office Visit: 12/04/2019  Referring provider: Charlott Rakes, MD Primary care provider: Charlott Rakes, MD  Chief Complaint: No chief complaint on file.  History of Present Illness: I had the pleasure of seeing Jamie Mitchell for initial evaluation at the [facility] [place] on 12/04/2019. She is a 74 y.o. female, who is referred here by Charlott Rakes, MD for the evaluation of angioedema.  ***  Assessment and Plan: Jamie Mitchell is a 74 y.o. female with: No problem-specific Assessment & Plan notes found for this encounter.  No follow-ups on file.  No orders of the defined types were placed in this encounter.  Lab Orders  No laboratory test(s) ordered today    Other allergy screening: Asthma: {Blank single:19197::"yes","no"} Rhino conjunctivitis: {Blank single:19197::"yes","no"} Food allergy: {Blank single:19197::"yes","no"} Medication allergy: {Blank single:19197::"yes","no"} Hymenoptera allergy: {Blank single:19197::"yes","no"} Urticaria: {Blank single:19197::"yes","no"} Eczema:{Blank single:19197::"yes","no"} History of recurrent infections suggestive of immunodeficency: {Blank single:19197::"yes","no"}  Diagnostics: Spirometry:  Tracings reviewed. Her effort: {Blank single:19197::"Good reproducible efforts.","It was hard to get consistent efforts and there is a question as to whether this reflects a maximal maneuver.","Poor effort, data can not be interpreted."} FVC: ***L FEV1: ***L, ***% predicted FEV1/FVC ratio: ***% Interpretation: {Blank single:19197::"Spirometry consistent with mild obstructive disease","Spirometry consistent with moderate obstructive disease","Spirometry consistent with severe obstructive disease","Spirometry consistent with possible restrictive disease","Spirometry consistent with mixed obstructive and restrictive disease","Spirometry  uninterpretable due to technique","Spirometry consistent with normal pattern","No overt abnormalities noted given today's efforts"}.  Please see scanned spirometry results for details.  Skin Testing: {Blank single:19197::"Select foods","Environmental allergy panel","Environmental allergy panel and select foods","Food allergy panel","None","Deferred due to recent antihistamines use"}. Positive test to: ***. Negative test to: ***.  Results discussed with patient/family.   Past Medical History: Patient Active Problem List   Diagnosis Date Noted  . Glottic edema 12/02/2019  . Prolonged PTT (partial thromboplastin time) 11/19/2019  . 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 Esophageal dysmotility 11/18/2019  . Gastroesophageal reflux 11/18/2019  . 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 Edema glottis 11/17/2019  . Stridor 10/31/2019  . SOB (shortness of breath) 10/30/2019  . Subglottic edema 10/30/2019  . Renal failure 07/25/2019  . Prediabetes 07/25/2019  . 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 Dysphagia 07/25/2019  . 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 Acute respiratory failure with hypoxemia ([place])   . Tachycardia-bradycardia syndrome ([place])   . Atrial fibrillation with RVR ([place])   . Hypertension 07/23/2015  . Hyperlipidemia 07/23/2015  . 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 Hypothyroidism 01/25/2014   Past Medical History:  Diagnosis Date  . Atrial fibrillation ([place])   . 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 Hypertension   . Stroke ([place])   . Thyroid disease    Past Surgical History: Past Surgical History:  Procedure Laterality Date  . IR CT HEAD LTD  07/20/2019  . IR PERCUTANEOUS ART THROMBECTOMY/INFUSION INTRACRANIAL INC DIAG ANGIO  07/20/2019  . RADIOLOGY WITH  ANESTHESIA N/A 07/20/2019   Procedure: IR WITH ANESTHESIA;  Surgeon: Luanne Bras, MD;  Location: [facility];  Service: Radiology;  Laterality: N/A;   Medication List:  No current facility-administered medications for this visit.   No current outpatient medications on file.   Facility-Administered Medications Ordered in Other Visits  Medication Dose Route  Frequency Provider Last Rate Last Admin  . acetaminophen (TYLENOL) tablet 650 mg  650 mg Oral Q6H PRN Candee Furbish, MD   650 mg at 12/03/19 2110  . atorvastatin (LIPITOR) tablet 40 mg  40 mg Oral QPM Beard, Samantha N, DO   40 mg at 12/03/19 1735  . Chlorhexidine Gluconate Cloth 2 % PADS 6 each  6 each Topical Daily Candee Furbish, MD   6 each at 12/03/19 0911  . enoxaparin (LOVENOX) injection 65 mg  1 mg/kg Subcutaneous Q12H Candee Furbish, MD   65 mg at 12/04/19 [phone]  . famotidine (PEPCID) tablet 20 mg  20 mg Oral BID Candee Furbish, MD   20 mg at 12/03/19 2103  . insulin aspart (novoLOG) injection 0-15 Units  0-15 Units Subcutaneous Q4H Judd Lien, MD   2 Units at 12/04/19 0342  . levothyroxine (SYNTHROID) tablet 88 mcg  88 mcg Oral QAC breakfast Patriciaann Clan, DO   88 mcg at 12/04/19 0631  . loratadine (CLARITIN) tablet 10 mg  10 mg Oral Daily Darrelyn Hillock N, DO   10 mg at 12/03/19 0911  . pantoprazole (PROTONIX) EC tablet 40 mg  40 mg Oral Daily Candee Furbish, MD      . predniSONE (DELTASONE) tablet 40 mg  40 mg Oral Q breakfast Candee Furbish, MD   40 mg at 12/04/19 0631   Followed by  . [START ON 12/11/2019] predniSONE (DELTASONE) tablet 20 mg  20 mg Oral Q breakfast Candee Furbish, MD       Allergies: No Known Allergies Social History: Social History   Socioeconomic History  . Marital status: Married    Spouse name: Not on file  . Number of children: Not on file  . Years of education: Not on file  . Highest education level: Not on file  Occupational History  . Not on file  Tobacco Use  . Smoking  status: Never Smoker  . Smokeless tobacco: Never Used  Substance and Sexual Activity  . Alcohol use: No  . Drug use: No  . Sexual activity: Not Currently    Birth control/protection: None    Comment: Married  Other Topics Concern  . Not on file  Social History Narrative   Lives with son [name])    Social Determinants of Health   Financial Resource Strain:   . Difficulty of Paying Living Expenses:   Food Insecurity:   . Worried About Charity fundraiser in the Last Year:   . Arboriculturist in the Last Year:   Transportation Needs: No Transportation Needs  . Lack of Transportation (Medical): No  . Lack of Transportation (Non-Medical): No  Physical Activity:   . Days of Exercise per Week:   . Minutes of Exercise per Session:   Stress:   . Feeling of Stress :   Social Connections:   . Frequency of Communication with Friends and Family:   . Frequency of Social Gatherings with Friends and Family:   . Attends Religious Services:   . Active Member of Clubs or Organizations:   . Attends Archivist Meetings:   [name] Marital Status:    Lives in a ***. Smoking: *** Occupation: ***  Environmental HistoryFreight forwarder in the house: Estate agent in the family room: {Blank single:19197::"yes","no"} Carpet in the bedroom: {Blank single:19197::"yes","no"} Heating: {Blank single:19197::"electric","gas"} Cooling: {Blank single:19197::"central","window"} Pet: {Blank single:19197::"yes ***","no"}  Family History: Family History  Family history unknown: Yes  Problem                               Relation Asthma                                   *** Eczema                                *** Food allergy                          *** Allergic rhino conjunctivitis     ***  Review of Systems  Constitutional: Negative for appetite change, chills, fever and unexpected weight change.  HENT: Negative for congestion and rhinorrhea.   Eyes:  Negative for itching.  Respiratory: Negative for cough, chest tightness, shortness of breath and wheezing.   Cardiovascular: Negative for chest pain.  Gastrointestinal: Negative for abdominal pain.  Genitourinary: Negative for difficulty urinating.  Skin: Negative for rash.  Neurological: Negative for headaches.   Objective: There were no vitals taken for this visit. There is no height or weight on file to calculate BMI. Physical Exam Vitals and nursing note reviewed.  Constitutional:      Appearance: Normal appearance. She is well-developed.  HENT:     Head: Normocephalic and atraumatic.     Right Ear: External ear normal.     Left Ear: External ear normal.     Nose: Nose normal.     Mouth/Throat:     Mouth: Mucous membranes are moist.     Pharynx: Oropharynx is clear.  Eyes:     Conjunctiva/sclera: Conjunctivae normal.  Cardiovascular:     Rate and Rhythm: Normal rate and regular rhythm.     Heart sounds: Normal heart sounds. No murmur heard.  No friction rub. No gallop.   Pulmonary:     Effort: Pulmonary effort is normal.     Breath sounds: Normal breath sounds. No wheezing, rhonchi or rales.  Abdominal:     Palpations: Abdomen is soft.  Musculoskeletal:     Cervical back: Neck supple.  Skin:    General: Skin is warm.     Findings: No rash.  Neurological:     Mental Status: She is alert and oriented to person, place, and time.  Psychiatric:        Behavior: Behavior normal.    The plan was reviewed with the patient/family, and all questions/concerned were addressed.  It was my pleasure to see Araceli today and participate in her care. Please feel free to contact me with any questions or concerns.  Sincerely,  Rexene Alberts, DO Allergy & Immunology  [facility] office: [phone] [facility] office: [place] office: [phone]

## 2019-12-04 NOTE — Progress Notes (Addendum)
Family Medicine Teaching Service Daily Progress Note Intern Pager: [phone]  Patient name: Jamie Mitchell Medical record number: [id number] Date of birth: 1946/05/23 Age: 74 y.o. Gender: female  Primary Care Provider: Charlott Rakes, MD Consultants: ENT, CCM Code Status: FULL  Pt Overview and Major Events to Date:  Admitted 7/10, transferred to ICU. Transferred back to progressive 7/11  Assessment and Plan: Jamie Mitchell is a 74 y.o. female presenting with shortness of breath. PMH is significant for glottic edema, hypothyroidism, A-fib, HTN, embolic stroke involving left MCA.   Glottic Edema  Patient with recurrent exacerbations of glottic edema since February 2021, responsive to steroids. Transferred back to our service from [place]. Home medications eliquis and amiodarone have been held due to some cases associated with angioedema. She will follow with [facility]NT outpatient for further work up. No stridor or wheezing on exam today. Patient satting >90% RA in no respiratory distress and not using accessory muscles. Continues to complain of now bilateral submandibular pain.  - [place] O2 supplementation as needed, sats >90%, change to BiPAP if respiratory distress or saturations < 90% - Monitor airway, vitals - Schedule [facility]NT outpatient follow up - Prednisone 40 mg 7/12-7/18 then 20 mg 7/19-7/25  - Consider speech eval once symptoms more stabilized  Hypothyroidism Most recent TSH 09/04/19, 8.96 - Continue Levothryoxine 88 mcg    A-fib- stable  Sinus rhythm on exam  - Cardiac monitor  - Amiodarone and eilquis have been discontinued, rare cases associated with angioedema - Lovenox   DM Most recent A1c 7.1. Home medication- metformin 500mg . CBG today ranging 130-262 - Continue to monitor - Increase metformin to 1000 mg   HLD  Continue home medications - Lipitor 40 mg   FEN/GI: Clear liquid Prophylaxis: Lovenox  Disposition: Likely discharge with close  ENT follow up  Subjective:  Patient is accompanied by her son. States that she still has a little trouble breathing though is feeling much better than when she arrived. She continues to have bilateral submandibular pain. Her son notes that she was dizzy earlier this morning before she took her medications. The dizziness subsided after having her coffee. She denies any chest pain. Does not complain of any pain or other symptoms.    Objective: Temp:  [97.9 F (36.6 C)-98.2 F (36.8 C)] 97.9 F (36.6 C) (07/12 0332) Pulse Rate:  [59-67] 59 (07/12 0332) Resp:  [16-21] 17 (07/12 0332) BP: (108-128)/(64-103) 124/79 (07/12 0332) SpO2:  [93 %-98 %] 96 % (07/12 0332) Physical Exam: General: Resting in bed, accompanied by son. No distress HEENT: tenderness to palpation bilateral submandibular region, no edema or erythema Cardiovascular: RRR, no murmurs  Respiratory: No accessory muscle use, no wheezing/stridor appreciated, satting >95% on room air Abdomen: mild tenderness in all quadrants, soft, non distended, no rebound or guarding, normoactive bowel sounds Extremities: no edema, 2+ PT and radial pulses   Laboratory: Recent Labs  Lab 12/02/19 1021 12/03/19 0406 12/04/19 0335  WBC 12.4* 14.5* 20.0*  HGB 13.8 13.4 13.5  HCT 43.4 41.1 39.9  PLT 281 285 306   Recent Labs  Lab 12/02/19 0923 12/02/19 1021 12/03/19 0406  NA 136 139 138  K 3.3* 4.0 4.4  CL  --  99 100  CO2  --  28 27  BUN  --  20 15  CREATININE  --  0.94 0.90  CALCIUM  --  8.7* 9.0  GLUCOSE  --  166* 138*    Imaging/Diagnostic Tests: CT Soft Tissue Neck  7/10 IMPRESSION: Motion degraded examination again showing a nonspecific edema pattern in the region of the glottis. This could be due to angio edema. Cannot rule out the possibility of a mass lesion, though this is not clearly defined. No evidence of adenopathy.  Sharion Settler, DO 12/04/2019, 7:21 AM PGY-1, [place] Intern pager:  [phone], text pages welcome

## 2019-12-05 DIAGNOSIS — R061 Stridor: Secondary | ICD-10-CM | POA: Insufficient documentation

## 2019-12-05 DIAGNOSIS — R490 Dysphonia: Secondary | ICD-10-CM | POA: Insufficient documentation

## 2019-12-06 ENCOUNTER — Other Ambulatory Visit: Payer: Self-pay | Admitting: Family Medicine

## 2019-12-06 ENCOUNTER — Telehealth: Payer: Self-pay | Admitting: Cardiology

## 2019-12-06 DIAGNOSIS — Z1211 Encounter for screening for malignant neoplasm of colon: Secondary | ICD-10-CM | POA: Diagnosis not present

## 2019-12-06 NOTE — Telephone Encounter (Signed)
Patient's daughter in law calling stating the patient has another appointment with another office 12/08/2019. The soonest next available appointment I saw was 01/04/2020, but she states it is too far. She would like a nurse to call her back to see if she can be worked in sooner.

## 2019-12-06 NOTE — Telephone Encounter (Signed)
Spoke with Ms. Jamie Mitchell regarding her mother who is scheduled on 7/16 at 10 am. The patient has another appointment in [place] at 9 am and they are worried about making it to Dr. Gilman Schmidt on time. Attempted to reschedule but first available was late August and patient does not want to wait that long. Spoke with Angie Fava, RN who is aware that patient may have to cancel. I suggested calling the [facility] office and trying to move up her first appointment. She verbalized understanding and will call back with any new concerns.

## 2019-12-06 NOTE — Progress Notes (Deleted)
{Choose 1 Note Type (Telehealth Visit or Telephone Visit):[phone]}   Date:  12/06/2019   ID:  Dajai Wahlert, DOB August 16, 1945, MRN [id number]  {Patient Location:[phone]::"Home"} {Provider [phone]::"Home Office"}  PCP:  Charlott Rakes, MD  Cardiologist:  Donato Heinz, MD  Electrophysiologist:  None   Evaluation Performed:  {Choose Visit Type:[phone]::"Follow-Up Visit"}  Chief Complaint:  ***  History of Present Illness:    Jamie Mitchell is a 74 y.o. female who presents virtually, with history of hypertension, hyperlipidemia, thyroid disease, acute CVA on 07/19/2019 with TPA administration, with CTA showing M2 occlusion.  Thrombectomy and revascularization were complicated by subdural hemorrhage.  She was admitted to neuro ICU.  During that admission she had intermittent atrial fibrillation/atrial flutter with RVR with post cardioversion pauses up to 8 seconds.  She was seen by electrophysiologist and was started on amiodarone.  She was discharged on Eliquis 5 mg twice daily and amiodarone 200 mg twice daily.  Echocardiogram during that admission showed an EF of 60% to 65%, normal RV  function, mild MR.  After discharge the patient did have a Zio patch for 14 days which showed numerous sinus pauses longest lasting 3.3 seconds.  She was referred back to EP and permanent pacemaker was planned.  However, this was put on hold as she was readmitted to the hospital [phone] with stridor and shortness of breath.  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  She received epinephrine in the ED and discharged on antibiotics and steroid taper.  She was seen in the office by Dr. Gardiner Rhyme on 11/15/2019, at which time she was without complaints of dyspnea chest pain lightheadedness or dizziness.  She was compliant with Eliquis.  Unfortunately, she was readmitted to the hospital on 12/02/2019 for hypoxic respiratory failure  in the setting of presumed recurrent angioedema/subglottic edema of unknown etiology.  She was ruled out for PE.  Eliquis was discontinued along with amiodarone as this was felt to be contributing to her angioedema, although rare.  She was discharged on Lovenox.  She was to follow-up at [facility] for direct visualization and laryngoscopy.  The patient {does/does not:200015} have symptoms concerning for COVID-19 infection (fever, chills, cough, or new shortness of breath).    Past Medical History:  Diagnosis Date  . Atrial fibrillation ([place])   . 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 Hypertension   . Stroke ([place])   . Thyroid disease    Past Surgical History:  Procedure Laterality Date  . IR CT HEAD LTD  07/20/2019  . IR PERCUTANEOUS ART THROMBECTOMY/INFUSION INTRACRANIAL INC DIAG ANGIO  07/20/2019  . RADIOLOGY WITH ANESTHESIA N/A 07/20/2019   Procedure: IR WITH ANESTHESIA;  Surgeon: Luanne Bras, MD;  Location: [place];  Service: Radiology;  Laterality: N/A;     No outpatient medications have been marked as taking for the 12/07/19 encounter (Appointment) with Lendon Colonel, NP.     Allergies:   Patient has no known allergies.   Social History   Tobacco Use  . Smoking status: Never Smoker  . Smokeless tobacco: Never Used  Substance Use Topics  . Alcohol use: No  . Drug use: No     Family Hx: The patient's Family history is unknown by patient.  ROS:   Please see the history of present illness.    *** All other systems reviewed and are negative.   Prior CV studies:   The following studies were reviewed today:  Echocardiogram 12/04/2019 CT Angio Chest PE with and/or without Contrast 7/10 IMPRESSION: 1. Evaluation of lower lobe pulmonary arteries is mildly limited, particularly on the right, due to respiratory motion. Within these limitations, no pulmonary emboli identified. 2. High attenuation layering in the gallbladder could represent sludge  or stones. 3. No other abnormalities.  Labs/Other Tests and Data Reviewed:    EKG:  {EKG/Telemetry Strips Reviewed:[phone]}  Recent Labs: 07/29/2019: Magnesium 2.1 11/18/2019: ALT 29 11/28/2019: TSH 2.510 12/02/2019: B Natriuretic Peptide 92.1 12/04/2019: BUN 14; Creatinine, Ser 0.91; Hemoglobin 13.5; Platelets 306; Potassium 4.2; Sodium 134   Recent Lipid Panel Lab Results  Component Value Date/Time   CHOL 113 07/20/2019 03:56 AM   CHOL 137 02/26/2017 10:35 AM   TRIG 57 07/20/2019 03:56 AM   HDL 36 (L) 07/20/2019 03:56 AM   HDL 35 (L) 02/26/2017 10:35 AM   CHOLHDL 3.1 07/20/2019 03:56 AM   LDLCALC 66 07/20/2019 03:56 AM   LDLCALC 49 02/26/2017 10:35 AM   LDLDIRECT 78 02/20/2016 12:21 PM    Wt Readings from Last 3 Encounters:  12/02/19 143 lb 4.8 oz (65 kg)  11/28/19 144 lb 3.2 oz (65.4 kg)  11/19/19 145 lb 15.1 oz (66.2 kg)     Objective:    Vital Signs:  There were no vitals taken for this visit.   {[facility] Virtual Exam (Optional):[phone]::"VITAL SIGNS:  reviewed"}  ASSESSMENT & PLAN:    1. ***  COVID-19 Education: The signs and symptoms of COVID-19 were discussed with the patient and how to seek care for testing (follow up with PCP or arrange E-visit).  ***The importance of social distancing was discussed today.  Time:   Today, I have spent *** minutes with the patient with telehealth technology discussing the above problems.     Medication Adjustments/Labs and Tests Ordered: Current medicines are reviewed at length with the patient today.  Concerns regarding medicines are outlined above.   Tests Ordered: No orders of the defined types were placed in this encounter.   Medication Changes: No orders of the defined types were placed in this encounter.   Disposition:  Follow up {follow up:15908}  Signed, Phill Myron. [place] Pugh, ANP, [facility]  12/06/2019 7:48 AM    [facility]

## 2019-12-07 ENCOUNTER — Telehealth: Payer: Medicare Other | Admitting: Adult Health

## 2019-12-07 ENCOUNTER — Telehealth: Payer: Self-pay

## 2019-12-07 LAB — COLOGUARD: Cologuard: NEGATIVE

## 2019-12-07 LAB — CULTURE, BLOOD (ROUTINE X 2)
Culture: NO GROWTH
Culture: NO GROWTH
Special Requests: ADEQUATE

## 2019-12-07 NOTE — Telephone Encounter (Signed)
results were given to [facility] patient's step daughter.

## 2019-12-07 NOTE — Telephone Encounter (Signed)
-----   Message from Charlott Rakes, MD sent at 11/29/2019  9:01 AM EDT ----- Thyroid labs are normal

## 2019-12-08 ENCOUNTER — Telehealth: Payer: Self-pay | Admitting: Cardiology

## 2019-12-08 ENCOUNTER — Ambulatory Visit: Payer: Medicare Other | Admitting: Cardiology

## 2019-12-08 DIAGNOSIS — E119 Type 2 diabetes mellitus without complications: Secondary | ICD-10-CM | POA: Insufficient documentation

## 2019-12-08 NOTE — Telephone Encounter (Signed)
Spanish interpreter [phone] Called pt using assistance of interpreter, pt's daughter-in-law asking for an appt prior to August since they had to cancel her appt today due to another appt. Notified that we do not have any appt available with APPs or with Dr.Schumann until August. Notified I could send this message to Dr.Schumann's primary RN to see if they could squeeze them into the schedule. The daughter-in-law states that the pt was on a medication (she thinks it is amiodarone) and it has been "suspended by the ER doctor" and they need to know if she needs to continue taking the medication again and are worried about her not being on the med. Notified I would route this to Dr.Schumman as well for his input. Daughter-in-law verbalized understanding and everything was clarified using the interpreter. No other questions at this time.

## 2019-12-08 NOTE — Telephone Encounter (Signed)
Can we add her on for Tuesday 7/20?  OK to double book.  Her amiodarone was stopped due to concern it may have caused her angioedema, would continue to hold

## 2019-12-08 NOTE — Telephone Encounter (Signed)
New Message  Call was taken with Spanish interpreter, Pts daughter in law is calling and is wanting to get the patient on the schedule for an appt. She says the pt cannot wait until August and she does not want a virtual appt. She says the patient is not taking her medications and is really concerned   Please call

## 2019-12-08 NOTE — Telephone Encounter (Signed)
Patient has been discharged. She does have multiple upcoming appointments with various departments.

## 2019-12-11 DIAGNOSIS — R05 Cough: Secondary | ICD-10-CM | POA: Diagnosis not present

## 2019-12-11 DIAGNOSIS — R0602 Shortness of breath: Secondary | ICD-10-CM | POA: Diagnosis not present

## 2019-12-11 DIAGNOSIS — R1311 Dysphagia, oral phase: Secondary | ICD-10-CM | POA: Diagnosis not present

## 2019-12-11 DIAGNOSIS — E785 Hyperlipidemia, unspecified: Secondary | ICD-10-CM | POA: Diagnosis not present

## 2019-12-11 DIAGNOSIS — Z7901 Long term (current) use of anticoagulants: Secondary | ICD-10-CM | POA: Diagnosis not present

## 2019-12-11 DIAGNOSIS — D1039 Benign neoplasm of other parts of mouth: Secondary | ICD-10-CM | POA: Diagnosis not present

## 2019-12-11 DIAGNOSIS — J386 Stenosis of larynx: Secondary | ICD-10-CM | POA: Diagnosis not present

## 2019-12-11 DIAGNOSIS — E119 Type 2 diabetes mellitus without complications: Secondary | ICD-10-CM | POA: Diagnosis not present

## 2019-12-11 DIAGNOSIS — Z8673 Personal history of transient ischemic attack (TIA), and cerebral infarction without residual deficits: Secondary | ICD-10-CM | POA: Diagnosis not present

## 2019-12-11 DIAGNOSIS — E039 Hypothyroidism, unspecified: Secondary | ICD-10-CM | POA: Diagnosis not present

## 2019-12-11 DIAGNOSIS — Z7984 Long term (current) use of oral hypoglycemic drugs: Secondary | ICD-10-CM | POA: Diagnosis not present

## 2019-12-11 DIAGNOSIS — I4891 Unspecified atrial fibrillation: Secondary | ICD-10-CM | POA: Diagnosis not present

## 2019-12-11 DIAGNOSIS — I10 Essential (primary) hypertension: Secondary | ICD-10-CM | POA: Diagnosis not present

## 2019-12-11 DIAGNOSIS — J029 Acute pharyngitis, unspecified: Secondary | ICD-10-CM | POA: Diagnosis not present

## 2019-12-11 DIAGNOSIS — R061 Stridor: Secondary | ICD-10-CM | POA: Diagnosis not present

## 2019-12-11 DIAGNOSIS — R490 Dysphonia: Secondary | ICD-10-CM | POA: Diagnosis not present

## 2019-12-11 DIAGNOSIS — J398 Other specified diseases of upper respiratory tract: Secondary | ICD-10-CM | POA: Diagnosis not present

## 2019-12-11 NOTE — Telephone Encounter (Addendum)
Attempt to call patient via interpreter (ID # [id number]  Need to schedule tomorrow with Dr. Nils Flack to double book per MD

## 2019-12-13 ENCOUNTER — Ambulatory Visit: Payer: Medicare Other

## 2019-12-13 ENCOUNTER — Other Ambulatory Visit: Payer: Self-pay

## 2019-12-14 ENCOUNTER — Other Ambulatory Visit: Payer: Self-pay | Admitting: *Deleted

## 2019-12-14 NOTE — Patient Outreach (Signed)
[facility]) Care Management  12/14/2019  Jamie Mitchell 1946-05-14 [id number]   Subjective: Telephone callto patient's designated party release/ son [name]) mobile number, spoke with son, states he is currently busy, and will call this RNCM back at a later time.     Objective: Per KPN (Knowledge Performance Now, point of care tool) and chart review,patient hospitalized 11/17/2019 - 11/20/2019 for shortness of breath,  Acute respiratory failure, Glottic edema with stridor.     Patient hospitalized 10/30/2019 -11/01/2019 for shortness of breath. Patient hospitalized 07/25/2019 - 08/03/2019 forLeft middle cerebral artery strokerehab. 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 Patient also has a history of Hypothyroidism,Hypertension,Prediabetes,Aphasia as late effect of cerebrovascular accident, and Dysphagia.    Assessment: Received [facility] Liaison referral on 08/03/2019. Referral source: Natividad Brood. Referral reason: This was a hospital referral from the nutritionist/patient on [employer] family needed teaching and support -Has home health; [facility] is TOC.  Please assign to [place] Coordinator for complex care and disease management follow up calls, patient is aphasic and speaks/understands Spanish, her son is the contact person and speaks fluent [place], his name is [name], (Son) [phone] and assess for further needs. Patient was in the rehab center.Screening follow up completed,hasreferredto Education officer, museum for [employer] follow up, telephone assessment pending,andRNCM will continue to follow for care management needs.   Plan:  RNCM will call patient's son/ designated party releasefor 2nd telephone outreach attempt, within4business days,assessment follow  up. RNCMhassentresource letter to patient's son with the following education material per his request:Aphasia(EMMI handout) and with verify receipt status on next patient outreach, within 4 business days.      Airam Runions H. Annia Friendly, BSN, [place] [facility] [place] [facility] Telephonic CM Phone: [phone] Fax: [phone]

## 2019-12-15 ENCOUNTER — Ambulatory Visit: Payer: Self-pay | Admitting: *Deleted

## 2019-12-18 NOTE — Progress Notes (Deleted)
Cardiology Office Note:    Date:  12/18/2019   ID:  Jamie Mitchell [birth year], MRN [id number]  PCP:  Charlott Rakes, MD  Cardiologist:  Donato Heinz, MD  Electrophysiologist:  None   Referring MD: Charlott Rakes, MD   No chief complaint on file.   History of Present Illness:    Jamie Mitchell is a 74 y.o. female with a hx of hypertension, HLD, and thyroid disease  who presents for follow-up.  Patient moved from [place] to [place] last month.  She was admitted on 07/19/2019 to [facility] with acute CVA.  TPA was administered, and CTA head showed M2 occlusion.  Thrombectomy and revascularization were complicated by subarachnoid hemorrhage.  She was admitted to the neuro ICU.  Course was complicated by intermittent atrial fibrillation/atrial flutter with RVR with postconversion pauses up to 8 seconds.  EP was consulted, and she was started on amiodarone.  She was discharged on Eliquis 5 mg twice daily and amiodarone 200 mg twice daily.  TTE during admission showed EF 60 to 65%, normal RV function, mild MR. After discharge from hospital, a Zio patch x14 days was done, which showed numerous sinus pauses, longest lasting 3.3 seconds.  She was referred to EP and PPM was planned.  However, this was put on hold as she was admitted to [facility] from 6/7 through 11/01/2019 with shortness of breath and stridor.  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  She received epinephrine in the ED.  She was discharged on antibiotics and a steroid taper.  She was made up again from 6/25 through 11/20/2019 with shortness of breath and stridor found to have glottic edema.  She was seen by ENT who recommended tertiary ENT referral and treatment with prednisone taper.  Since she was discharged from the hospital, she reports her dyspnea has resolved.  She denies any syncope or presyncope.  Denies chest pain, lightheadedness, or palpitations.  She is taking Eliquis,  denies any bleeding issues.  Subsequently was admitted again on 12/02/2019 through 12/04/2019 with recurrent angioedema.  She underwent balloon dilatation of subglottic stenosis and excision of mucosal lesion in uvula on 12/11/2019.   Past Medical History:  Diagnosis Date  . Atrial fibrillation ([place])   . 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 Hypertension   . Stroke ([place])   . Thyroid disease     Past Surgical History:  Procedure Laterality Date  . IR CT HEAD LTD  07/20/2019  . IR PERCUTANEOUS ART THROMBECTOMY/INFUSION INTRACRANIAL INC DIAG ANGIO  07/20/2019  . RADIOLOGY WITH ANESTHESIA N/A 07/20/2019   Procedure: IR WITH ANESTHESIA;  Surgeon: Luanne Bras, MD;  Location: [place];  Service: Radiology;  Laterality: N/A;    Current Medications: No outpatient medications have been marked as taking for the 12/19/19 encounter (Appointment) with Donato Heinz, MD.     Allergies:   Patient has no known allergies.   Social History   Socioeconomic History  . Marital status: Married    Spouse name: Not on file  . Number of children: Not on file  . Years of education: Not on file  . Highest education level: Not on file  Occupational History  . Not on file  Tobacco Use  . Smoking status: Never Smoker  . Smokeless tobacco: Never Used  Substance and Sexual Activity  . Alcohol use: No  . Drug use: No  . Sexual activity: Not Currently    Birth control/protection: None  Comment: Married  Other Topics Concern  . Not on file  Social History Narrative   Lives with son Jamie Mitchell)    Social Determinants of Health   Financial Resource Strain:   . Difficulty of Paying Living Expenses:   Food Insecurity:   . Worried About Charity fundraiser in the Last Year:   . Arboriculturist in the Last Year:   Transportation Needs: No Transportation Needs  . Lack of Transportation (Medical): No  . Lack of Transportation (Non-Medical): No  Physical Activity:   . Days of Exercise per Week:   .  Minutes of Exercise per Session:   Stress:   . Feeling of Stress :   Social Connections:   . Frequency of Communication with Friends and Family:   . Frequency of Social Gatherings with Friends and Family:   . Attends Religious Services:   . Active Member of Clubs or Organizations:   . Attends Archivist Meetings:   [name] Marital Status:      Family History: The patient's Family history is unknown by patient.  ROS:   Please see the history of present illness.     All other systems reviewed and are negative.  EKGs/Labs/Other Studies Reviewed:    The following studies were reviewed today:   EKG:  EKG is ordered today.  The ekg ordered today demonstrates sinus rhythm, rate 63, no ST/T abnormalities   TTE 07/20/19: 1. Left ventricular ejection fraction, by estimation, is 60 to 65%. The  left ventricle has normal function. The left ventricle has no regional  wall motion abnormalities. Left ventricular diastolic parameters were  normal.  2. Right ventricular systolic function is normal. The right ventricular  size is normal. Tricuspid regurgitation signal is inadequate for assessing  PA pressure.  3. The mitral valve is normal in structure and function. Mild mitral  valve regurgitation. No evidence of mitral stenosis.  4. The aortic valve is normal in structure and function. Aortic valve  regurgitation is not visualized. No aortic stenosis is present.  5. The inferior vena cava is normal in size with greater than 50%  respiratory variability, suggesting right atrial pressure of 3 mmHg.   Recent Labs: 07/29/2019: Magnesium 2.1 11/18/2019: ALT 29 11/28/2019: TSH 2.510 12/02/2019: B Natriuretic Peptide 92.1 12/04/2019: BUN 14; Creatinine, Ser 0.91; Hemoglobin 13.5; Platelets 306; Potassium 4.2; Sodium 134  Recent Lipid Panel    Component Value Date/Time   CHOL 113 07/20/2019 0356   CHOL 137 02/26/2017 1035   TRIG 57 07/20/2019 0356   HDL 36 (L) 07/20/2019 0356   HDL 35  (L) 02/26/2017 1035   CHOLHDL 3.1 07/20/2019 0356   VLDL 11 07/20/2019 0356   LDLCALC 66 07/20/2019 0356   LDLCALC 49 02/26/2017 1035   LDLDIRECT 78 02/20/2016 1221    Physical Exam:    VS:  There were no vitals taken for this visit.    Wt Readings from Last 3 Encounters:  12/02/19 143 lb 4.8 oz (65 kg)  11/28/19 144 lb 3.2 oz (65.4 kg)  11/19/19 145 lb 15.1 oz (66.2 kg)     XBL:TJQZ nourished, well developed in no acute distress HEENT: Normal NECK: No JVD CARDIAC: Bradycardic, irregular no murmurs, rubs, gallops RESPIRATORY:  Clear to auscultation without rales, wheezing or rhonchi  ABDOMEN: Soft, non-tender, non-distended MUSCULOSKELETAL:  No edema; No deformity  SKIN: Warm and dry NEUROLOGIC:  Alert and oriented x 3 PSYCHIATRIC:  Normal affect   ASSESSMENT:    No  diagnosis found. PLAN:     Tachybrady syndrome: has Paroxysmal atrial fibrillation, but also having sinus pauses up to 3.3 seconds on recent monitor.  Referred to EP, planning for PPM - EP follow-up  Atrial fibrillation/atrial flutter: Intermittent episodes of AF/AFL with RVR following acute CVA that was complicated by [facility].  Had postconversion pauses up to 8 seconds during CVA admission.  TTE shows normal LVEF.  No atrial fibrillation on Zio patch x14 days on 09/15/2019.  Amiodarone and Eliquis have been held to ensure not potential cause of angioedema.  Current angioedema: Multiple admissions for this, referred to [facility] for evaluation.  She underwent balloon dilatation of subglottic stenosis and excision of mucosal lesion in uvula on 12/11/2019.  RTC in 6 months  Medication Adjustments/Labs and Tests Ordered: Current medicines are reviewed at length with the patient today.  Concerns regarding medicines are outlined above.  No orders of the defined types were placed in this encounter.  No orders of the defined types were placed in this encounter.   There are no Patient Instructions on file for this  visit.   Signed, Donato Heinz, MD  12/18/2019 8:15 PM    [facility]

## 2019-12-19 ENCOUNTER — Ambulatory Visit: Payer: Medicare Other | Admitting: Cardiology

## 2019-12-19 DIAGNOSIS — R062 Wheezing: Secondary | ICD-10-CM | POA: Diagnosis not present

## 2019-12-19 DIAGNOSIS — R061 Stridor: Secondary | ICD-10-CM | POA: Diagnosis not present

## 2019-12-19 DIAGNOSIS — J384 Edema of larynx: Secondary | ICD-10-CM | POA: Diagnosis not present

## 2019-12-19 DIAGNOSIS — I63412 Cerebral infarction due to embolism of left middle cerebral artery: Secondary | ICD-10-CM | POA: Diagnosis not present

## 2019-12-19 DIAGNOSIS — T17908S Unspecified foreign body in respiratory tract, part unspecified causing other injury, sequela: Secondary | ICD-10-CM | POA: Diagnosis not present

## 2019-12-19 DIAGNOSIS — R490 Dysphonia: Secondary | ICD-10-CM | POA: Diagnosis not present

## 2019-12-20 ENCOUNTER — Ambulatory Visit: Payer: Self-pay | Admitting: *Deleted

## 2019-12-21 ENCOUNTER — Ambulatory Visit: Payer: Medicare Other | Admitting: Adult Health

## 2019-12-21 ENCOUNTER — Other Ambulatory Visit: Payer: Self-pay

## 2019-12-21 ENCOUNTER — Inpatient Hospital Stay (HOSPITAL_COMMUNITY): Payer: Medicare Other

## 2019-12-21 ENCOUNTER — Emergency Department (HOSPITAL_COMMUNITY): Payer: Medicare Other

## 2019-12-21 ENCOUNTER — Inpatient Hospital Stay (HOSPITAL_COMMUNITY)
Admission: EM | Admit: 2019-12-21 | Discharge: 2020-01-04 | DRG: 004 | Disposition: A | Discharge status: Home / Self Care | Payer: Medicare Other | Attending: Student | Admitting: Student

## 2019-12-21 DIAGNOSIS — K219 Gastro-esophageal reflux disease without esophagitis: Secondary | ICD-10-CM | POA: Diagnosis present

## 2019-12-21 DIAGNOSIS — Z4659 Encounter for fitting and adjustment of other gastrointestinal appliance and device: Secondary | ICD-10-CM

## 2019-12-21 DIAGNOSIS — E869 Volume depletion, unspecified: Secondary | ICD-10-CM | POA: Diagnosis not present

## 2019-12-21 DIAGNOSIS — R1310 Dysphagia, unspecified: Secondary | ICD-10-CM | POA: Diagnosis present

## 2019-12-21 DIAGNOSIS — R918 Other nonspecific abnormal finding of lung field: Secondary | ICD-10-CM | POA: Diagnosis not present

## 2019-12-21 DIAGNOSIS — Z20822 Contact with and (suspected) exposure to covid-19: Secondary | ICD-10-CM | POA: Diagnosis present

## 2019-12-21 DIAGNOSIS — I480 Paroxysmal atrial fibrillation: Secondary | ICD-10-CM | POA: Diagnosis present

## 2019-12-21 DIAGNOSIS — R0603 Acute respiratory distress: Secondary | ICD-10-CM | POA: Diagnosis not present

## 2019-12-21 DIAGNOSIS — I951 Orthostatic hypotension: Secondary | ICD-10-CM | POA: Diagnosis present

## 2019-12-21 DIAGNOSIS — J80 Acute respiratory distress syndrome: Secondary | ICD-10-CM

## 2019-12-21 DIAGNOSIS — J9600 Acute respiratory failure, unspecified whether with hypoxia or hypercapnia: Secondary | ICD-10-CM | POA: Diagnosis not present

## 2019-12-21 DIAGNOSIS — R739 Hyperglycemia, unspecified: Secondary | ICD-10-CM | POA: Diagnosis not present

## 2019-12-21 DIAGNOSIS — J9811 Atelectasis: Secondary | ICD-10-CM | POA: Diagnosis not present

## 2019-12-21 DIAGNOSIS — R061 Stridor: Secondary | ICD-10-CM | POA: Diagnosis not present

## 2019-12-21 DIAGNOSIS — Z7984 Long term (current) use of oral hypoglycemic drugs: Secondary | ICD-10-CM | POA: Diagnosis not present

## 2019-12-21 DIAGNOSIS — Z79890 Hormone replacement therapy (postmenopausal): Secondary | ICD-10-CM | POA: Diagnosis not present

## 2019-12-21 DIAGNOSIS — J384 Edema of larynx: Secondary | ICD-10-CM

## 2019-12-21 DIAGNOSIS — Z8673 Personal history of transient ischemic attack (TIA), and cerebral infarction without residual deficits: Secondary | ICD-10-CM | POA: Diagnosis not present

## 2019-12-21 DIAGNOSIS — J383 Other diseases of vocal cords: Secondary | ICD-10-CM | POA: Diagnosis present

## 2019-12-21 DIAGNOSIS — K224 Dyskinesia of esophagus: Secondary | ICD-10-CM | POA: Diagnosis present

## 2019-12-21 DIAGNOSIS — Z79899 Other long term (current) drug therapy: Secondary | ICD-10-CM | POA: Diagnosis not present

## 2019-12-21 DIAGNOSIS — K5939 Other megacolon: Secondary | ICD-10-CM | POA: Diagnosis not present

## 2019-12-21 DIAGNOSIS — Y95 Nosocomial condition: Secondary | ICD-10-CM | POA: Diagnosis present

## 2019-12-21 DIAGNOSIS — J9601 Acute respiratory failure with hypoxia: Secondary | ICD-10-CM | POA: Diagnosis present

## 2019-12-21 DIAGNOSIS — Z789 Other specified health status: Secondary | ICD-10-CM | POA: Diagnosis not present

## 2019-12-21 DIAGNOSIS — E785 Hyperlipidemia, unspecified: Secondary | ICD-10-CM | POA: Diagnosis present

## 2019-12-21 DIAGNOSIS — E039 Hypothyroidism, unspecified: Secondary | ICD-10-CM | POA: Diagnosis present

## 2019-12-21 DIAGNOSIS — J9690 Respiratory failure, unspecified, unspecified whether with hypoxia or hypercapnia: Secondary | ICD-10-CM | POA: Diagnosis not present

## 2019-12-21 DIAGNOSIS — T380X5A Adverse effect of glucocorticoids and synthetic analogues, initial encounter: Secondary | ICD-10-CM | POA: Diagnosis present

## 2019-12-21 DIAGNOSIS — Z7952 Long term (current) use of systemic steroids: Secondary | ICD-10-CM | POA: Diagnosis not present

## 2019-12-21 DIAGNOSIS — I4891 Unspecified atrial fibrillation: Secondary | ICD-10-CM | POA: Diagnosis not present

## 2019-12-21 DIAGNOSIS — R4020 Unspecified coma: Secondary | ICD-10-CM | POA: Diagnosis not present

## 2019-12-21 DIAGNOSIS — I10 Essential (primary) hypertension: Secondary | ICD-10-CM | POA: Diagnosis present

## 2019-12-21 DIAGNOSIS — J386 Stenosis of larynx: Secondary | ICD-10-CM | POA: Diagnosis present

## 2019-12-21 DIAGNOSIS — R404 Transient alteration of awareness: Secondary | ICD-10-CM | POA: Diagnosis not present

## 2019-12-21 DIAGNOSIS — J189 Pneumonia, unspecified organism: Secondary | ICD-10-CM | POA: Diagnosis not present

## 2019-12-21 DIAGNOSIS — A419 Sepsis, unspecified organism: Principal | ICD-10-CM | POA: Diagnosis present

## 2019-12-21 DIAGNOSIS — R7303 Prediabetes: Secondary | ICD-10-CM

## 2019-12-21 DIAGNOSIS — Z978 Presence of other specified devices: Secondary | ICD-10-CM

## 2019-12-21 DIAGNOSIS — I4892 Unspecified atrial flutter: Secondary | ICD-10-CM | POA: Diagnosis not present

## 2019-12-21 DIAGNOSIS — I69320 Aphasia following cerebral infarction: Secondary | ICD-10-CM | POA: Diagnosis not present

## 2019-12-21 DIAGNOSIS — R0902 Hypoxemia: Secondary | ICD-10-CM | POA: Diagnosis not present

## 2019-12-21 DIAGNOSIS — Z9889 Other specified postprocedural states: Secondary | ICD-10-CM | POA: Diagnosis not present

## 2019-12-21 DIAGNOSIS — E1165 Type 2 diabetes mellitus with hyperglycemia: Secondary | ICD-10-CM | POA: Diagnosis present

## 2019-12-21 DIAGNOSIS — R4182 Altered mental status, unspecified: Secondary | ICD-10-CM | POA: Diagnosis not present

## 2019-12-21 DIAGNOSIS — Z4682 Encounter for fitting and adjustment of non-vascular catheter: Secondary | ICD-10-CM | POA: Diagnosis not present

## 2019-12-21 DIAGNOSIS — D649 Anemia, unspecified: Secondary | ICD-10-CM | POA: Diagnosis present

## 2019-12-21 DIAGNOSIS — J9602 Acute respiratory failure with hypercapnia: Secondary | ICD-10-CM | POA: Diagnosis not present

## 2019-12-21 DIAGNOSIS — R6520 Severe sepsis without septic shock: Secondary | ICD-10-CM | POA: Diagnosis not present

## 2019-12-21 DIAGNOSIS — K6389 Other specified diseases of intestine: Secondary | ICD-10-CM | POA: Diagnosis not present

## 2019-12-21 DIAGNOSIS — I69391 Dysphagia following cerebral infarction: Secondary | ICD-10-CM

## 2019-12-21 DIAGNOSIS — J690 Pneumonitis due to inhalation of food and vomit: Secondary | ICD-10-CM | POA: Diagnosis not present

## 2019-12-21 LAB — COMPREHENSIVE METABOLIC PANEL
ALT: 49 U/L — ABNORMAL HIGH (ref 0–44)
AST: 34 U/L (ref 15–41)
Albumin: 2.9 g/dL — ABNORMAL LOW (ref 3.5–5.0)
Alkaline Phosphatase: 49 U/L (ref 38–126)
Anion gap: 12 (ref 5–15)
BUN: 11 mg/dL (ref 8–23)
CO2: 22 mmol/L (ref 22–32)
Calcium: 8.1 mg/dL — ABNORMAL LOW (ref 8.9–10.3)
Chloride: 97 mmol/L — ABNORMAL LOW (ref 98–111)
Creatinine, Ser: 1.22 mg/dL — ABNORMAL HIGH (ref 0.44–1.00)
GFR calc Af Amer: 52 mL/min — ABNORMAL LOW (ref >60)
GFR calc non Af Amer: 45 mL/min — ABNORMAL LOW (ref >60)
Glucose, Bld: 306 mg/dL — ABNORMAL HIGH (ref 70–99)
Potassium: 4.1 mmol/L (ref 3.5–5.1)
Sodium: 131 mmol/L — ABNORMAL LOW (ref 135–145)
Total Bilirubin: 0.7 mg/dL (ref 0.3–1.2)
Total Protein: 6 g/dL — ABNORMAL LOW (ref 6.5–8.1)

## 2019-12-21 LAB — URINALYSIS, ROUTINE W REFLEX MICROSCOPIC
Bilirubin Urine: NEGATIVE
Glucose, UA: >=500 mg/dL — AB
Hgb urine dipstick: NEGATIVE
Ketones, ur: NEGATIVE mg/dL
Leukocytes,Ua: NEGATIVE
Nitrite: NEGATIVE
Protein, ur: 30 mg/dL — AB
Specific Gravity, Urine: 1.01 (ref 1.005–1.030)
pH: 7 (ref 5.0–8.0)

## 2019-12-21 LAB — SARS CORONAVIRUS 2 BY RT PCR (HOSPITAL ORDER, PERFORMED IN ~~LOC~~ HOSPITAL LAB): SARS Coronavirus 2: NEGATIVE

## 2019-12-21 LAB — CBC
HCT: 40.8 % (ref 36.0–46.0)
Hemoglobin: 12.8 g/dL (ref 12.0–15.0)
MCH: 28.9 pg (ref 26.0–34.0)
MCHC: 31.4 g/dL (ref 30.0–36.0)
MCV: 92.1 fL (ref 80.0–100.0)
Platelets: 226 10*3/uL (ref 150–400)
RBC: 4.43 MIL/uL (ref 3.87–5.11)
RDW: 16.6 % — ABNORMAL HIGH (ref 11.5–15.5)
WBC: 11.8 10*3/uL — ABNORMAL HIGH (ref 4.0–10.5)
nRBC: 0 % (ref 0.0–0.2)

## 2019-12-21 LAB — I-STAT ARTERIAL BLOOD GAS, ED
Acid-Base Excess: 5 mmol/L — ABNORMAL HIGH (ref 0.0–2.0)
Acid-base deficit: 1 mmol/L (ref 0.0–2.0)
Bicarbonate: 26 mmol/L (ref 20.0–28.0)
Bicarbonate: 30.8 mmol/L — ABNORMAL HIGH (ref 20.0–28.0)
Calcium, Ion: 1.13 mmol/L — ABNORMAL LOW (ref 1.15–1.40)
Calcium, Ion: 1.1 mmol/L — ABNORMAL LOW (ref 1.15–1.40)
HCT: 39 % (ref 36.0–46.0)
HCT: 36 % (ref 36.0–46.0)
Hemoglobin: 13.3 g/dL (ref 12.0–15.0)
Hemoglobin: 12.2 g/dL (ref 12.0–15.0)
O2 Saturation: 99 %
O2 Saturation: 100 %
Patient temperature: 100.4
Potassium: 3.8 mmol/L (ref 3.5–5.1)
Potassium: 3.8 mmol/L (ref 3.5–5.1)
Sodium: 131 mmol/L — ABNORMAL LOW (ref 135–145)
Sodium: 131 mmol/L — ABNORMAL LOW (ref 135–145)
TCO2: 28 mmol/L (ref 22–32)
TCO2: 32 mmol/L (ref 22–32)
pCO2 arterial: 52.4 mmHg — ABNORMAL HIGH (ref 32.0–48.0)
pCO2 arterial: 51.1 mmHg — ABNORMAL HIGH (ref 32.0–48.0)
pH, Arterial: 7.303 — ABNORMAL LOW (ref 7.350–7.450)
pH, Arterial: 7.393 (ref 7.350–7.450)
pO2, Arterial: 157 mmHg — ABNORMAL HIGH (ref 83.0–108.0)
pO2, Arterial: 215 mmHg — ABNORMAL HIGH (ref 83.0–108.0)

## 2019-12-21 LAB — APTT: aPTT: 29 seconds (ref 24–36)

## 2019-12-21 LAB — TROPONIN I (HIGH SENSITIVITY)
Troponin I (High Sensitivity): 15 ng/L (ref <18)
Troponin I (High Sensitivity): 42 ng/L — ABNORMAL HIGH (ref <18)

## 2019-12-21 LAB — CBG MONITORING, ED
Glucose-Capillary: 305 mg/dL — ABNORMAL HIGH (ref 70–99)
Glucose-Capillary: 190 mg/dL — ABNORMAL HIGH (ref 70–99)

## 2019-12-21 LAB — GLUCOSE, CAPILLARY: Glucose-Capillary: 75 mg/dL (ref 70–99)

## 2019-12-21 LAB — PROTIME-INR
INR: 1 (ref 0.8–1.2)
Prothrombin Time: 13 seconds (ref 11.4–15.2)

## 2019-12-21 LAB — RAPID URINE DRUG SCREEN, HOSP PERFORMED
Amphetamines: NOT DETECTED
Barbiturates: NOT DETECTED
Benzodiazepines: POSITIVE — AB
Cocaine: NOT DETECTED
Opiates: POSITIVE — AB
Tetrahydrocannabinol: NOT DETECTED

## 2019-12-21 LAB — BRAIN NATRIURETIC PEPTIDE: B Natriuretic Peptide: 114.1 pg/mL — ABNORMAL HIGH (ref 0.0–100.0)

## 2019-12-21 LAB — PROCALCITONIN: Procalcitonin: <0.1 ng/mL

## 2019-12-21 LAB — LACTATE DEHYDROGENASE: LDH: 188 U/L (ref 98–192)

## 2019-12-21 LAB — ETHANOL: Alcohol, Ethyl (B): <10 mg/dL (ref <10)

## 2019-12-21 LAB — LACTIC ACID, PLASMA
Lactic Acid, Venous: 2.1 mmol/L (ref 0.5–1.9)
Lactic Acid, Venous: 2.5 mmol/L (ref 0.5–1.9)

## 2019-12-21 LAB — D-DIMER, QUANTITATIVE: D-Dimer, Quant: 1.66 ug/mL-FEU — ABNORMAL HIGH (ref 0.00–0.50)

## 2019-12-21 IMAGING — DX DG CHEST 1V PORT
1 series · 1 of 1 positions shown · non-contrast
Comparison: Chest radiograph dated [DATE] and CT dated
[DATE]

CLINICAL DATA: 73-year-old female with respiratory failure.

EXAM:
PORTABLE CHEST 1 VIEW

[chest ap]
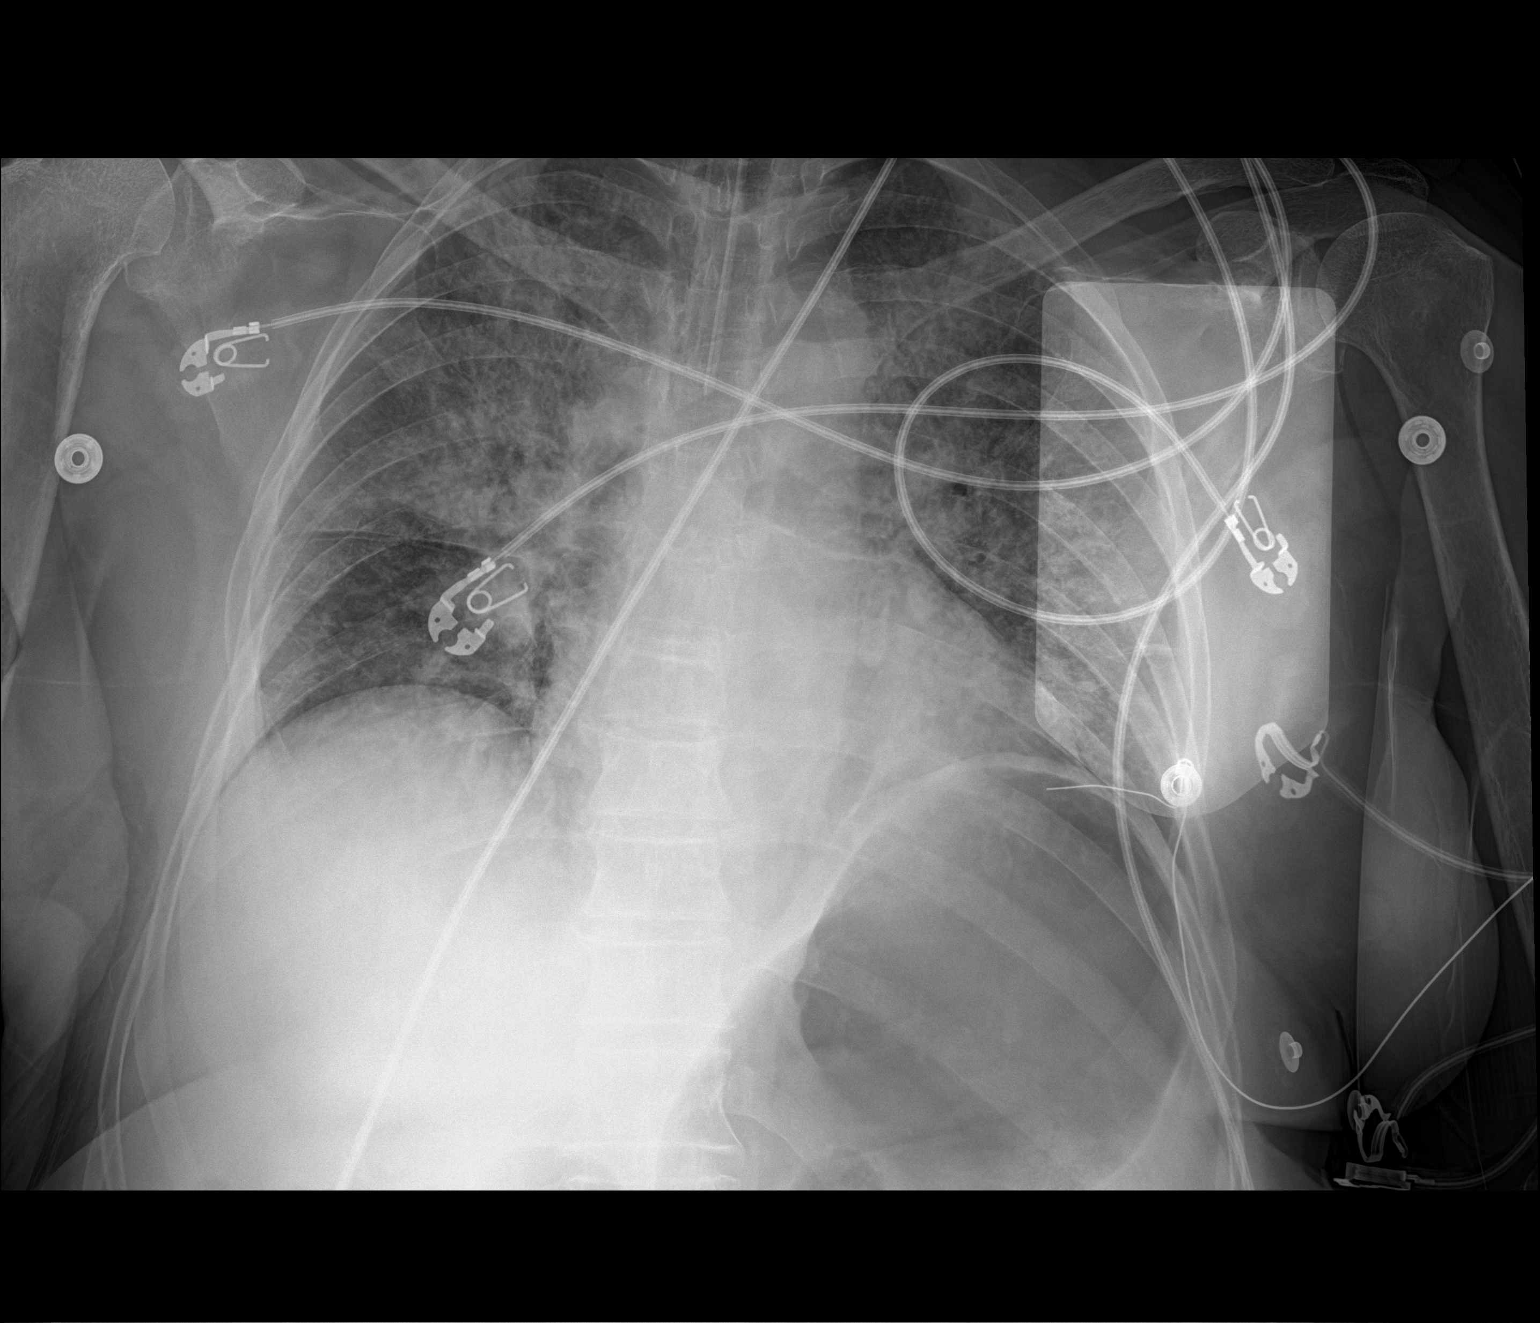

[1 of 1 positions shown; findings below may reference images not displayed]

FINDINGS: Endotracheal tube with tip approximately 15 mm above the carina.
Recommend retraction by approximately 3 cm for optimal positioning.

Diffuse bilateral interstitial and hazy airspace density may
represent multifocal pneumonia, edema, or ARDS. Clinical correlation
is recommended. No pleural effusion or pneumothorax. The cardiac
silhouette is within limits. No acute osseous pathology. Air
distended stomach.
IMPRESSION: 1. Endotracheal tube above the carina. Recommend retraction by
approximately 3 cm for optimal positioning.
2. Diffuse interstitial and airspace densities.

## 2019-12-21 IMAGING — CT CT HEAD W/O CM
3 of 4 series · 13 of 47 positions shown, 15 images · non-contrast
Comparison: Head CT [DATE], brain MRI [DATE]

CLINICAL DATA: Mental status change, unknown cause.

EXAM:
CT HEAD WITHOUT CONTRAST
TECHNIQUE: Contiguous axial images were obtained from the base of the skull
through the vertex without intravenous contrast.

[Series 3: head without · axial · non-contrast · 0.44mm/px · z∈[+512,+632]mm · 7 of 33 slices shown, 9 images]
[im 5/33  brain]
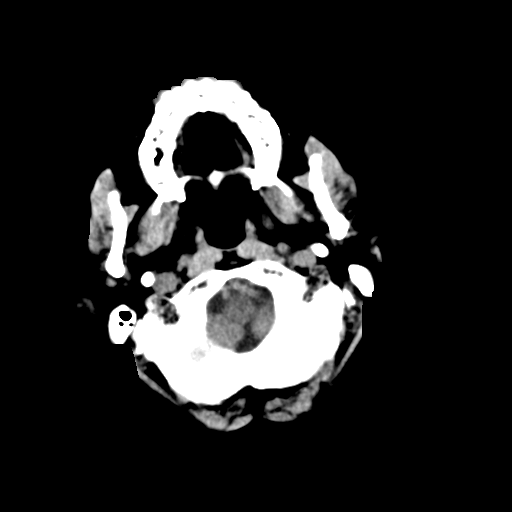
[im 5/33  bone]
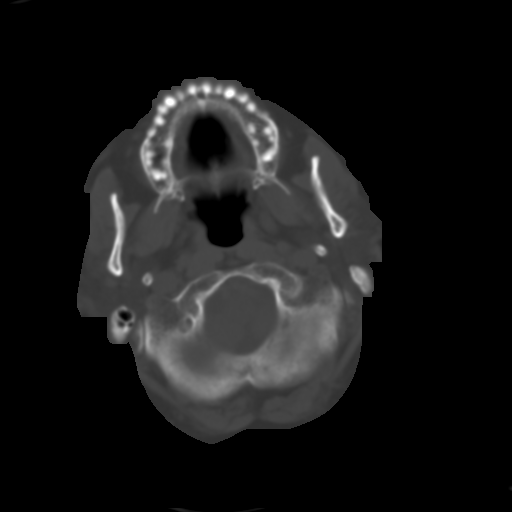
[im 9/33  brain]
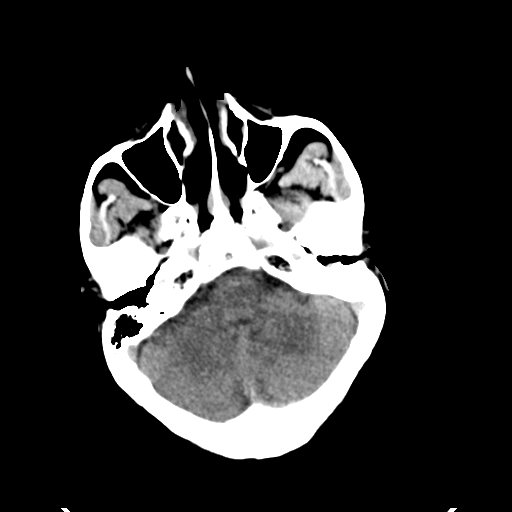
[im 13/33  brain]
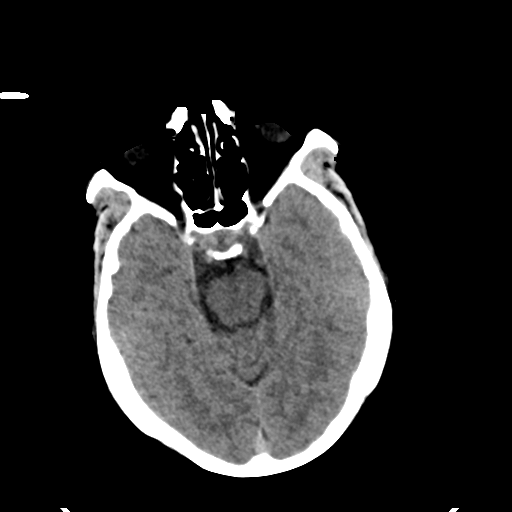
[im 17/33  brain]
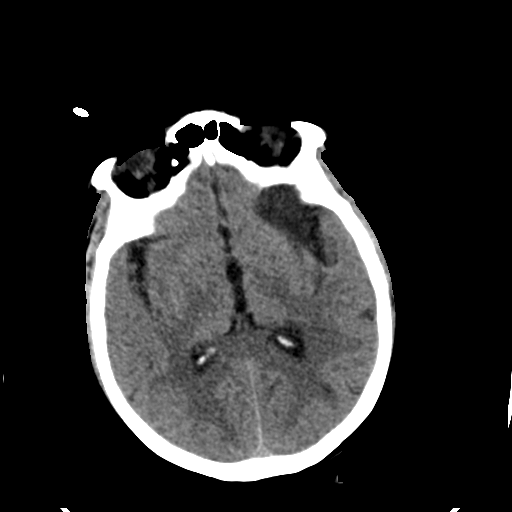
[im 21/33  brain]
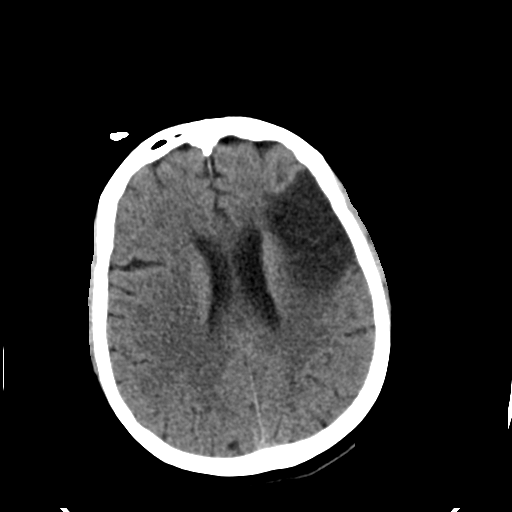
[im 21/33  bone]
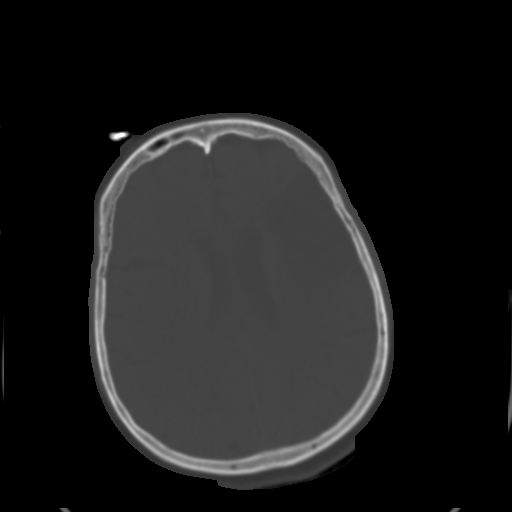
[im 25/33  brain]
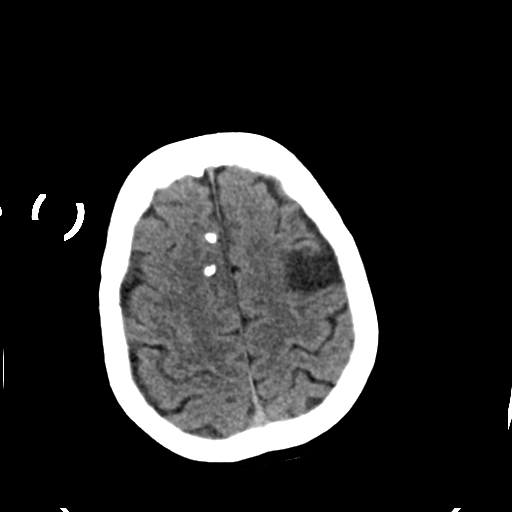
[im 29/33  brain]
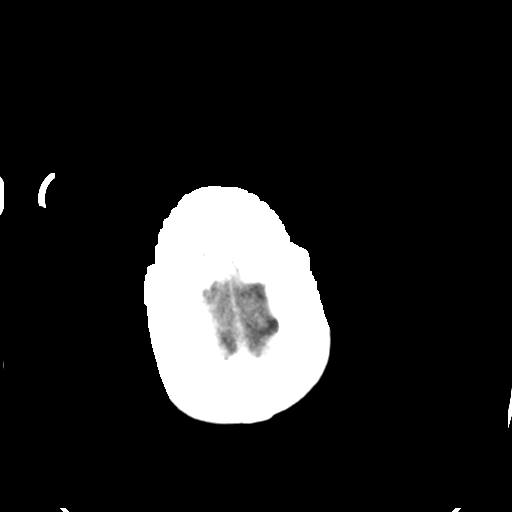

[Series 5: head without cor · coronal · non-contrast · 0.36mm/px · 3 of 73 slices shown]
[im 25/73  brain]
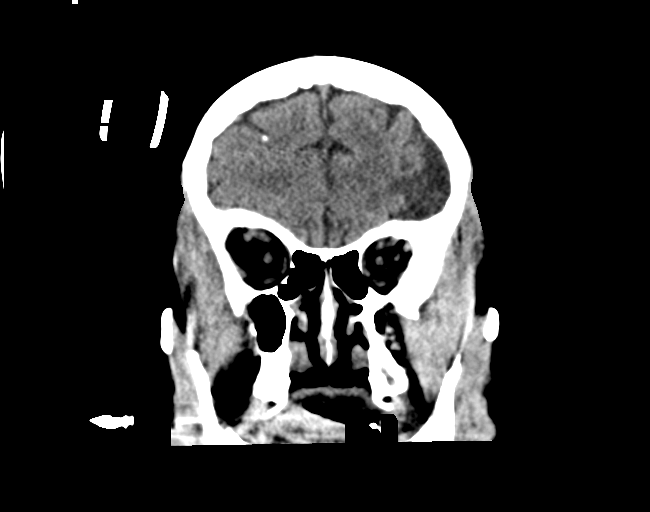
[im 33/73  brain]
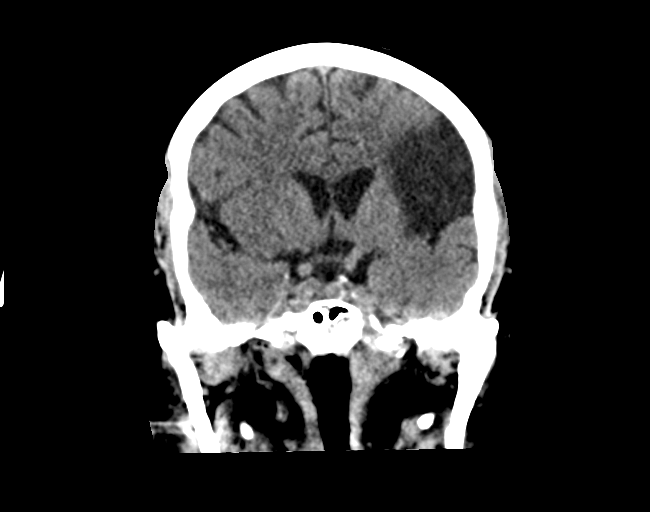
[im 41/73  brain]
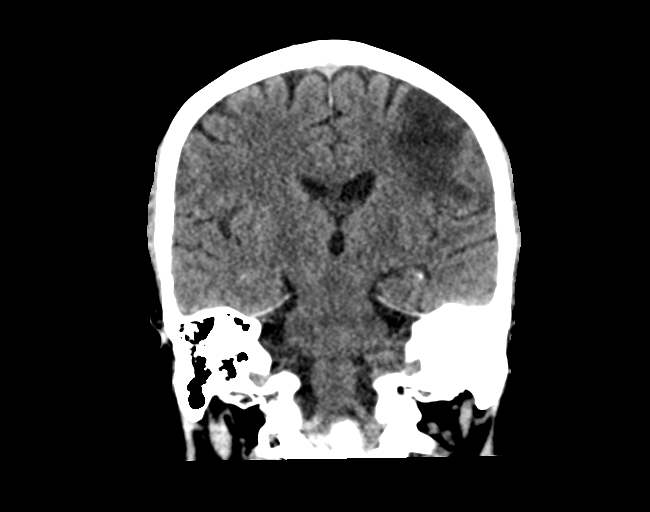

[Series 6: head without sag · sagittal · non-contrast · 0.36mm/px · 3 of 66 slices shown]
[im 22/66  brain]
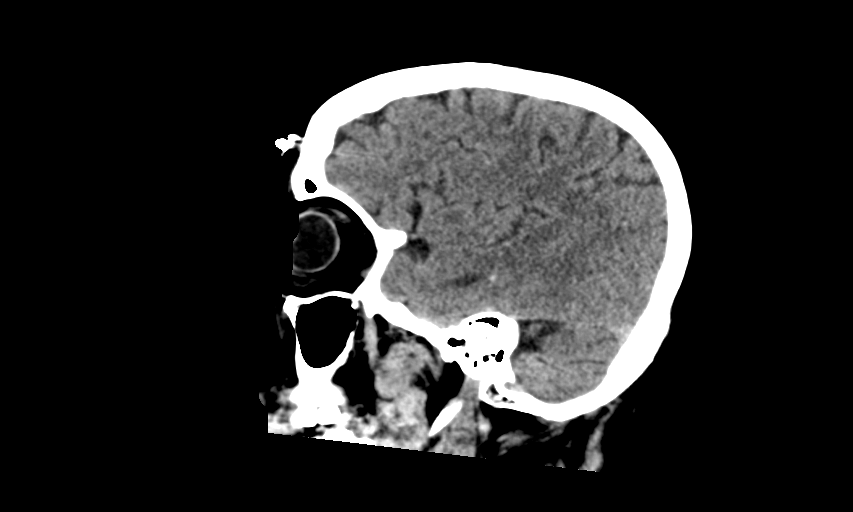
[im 33/66  brain]
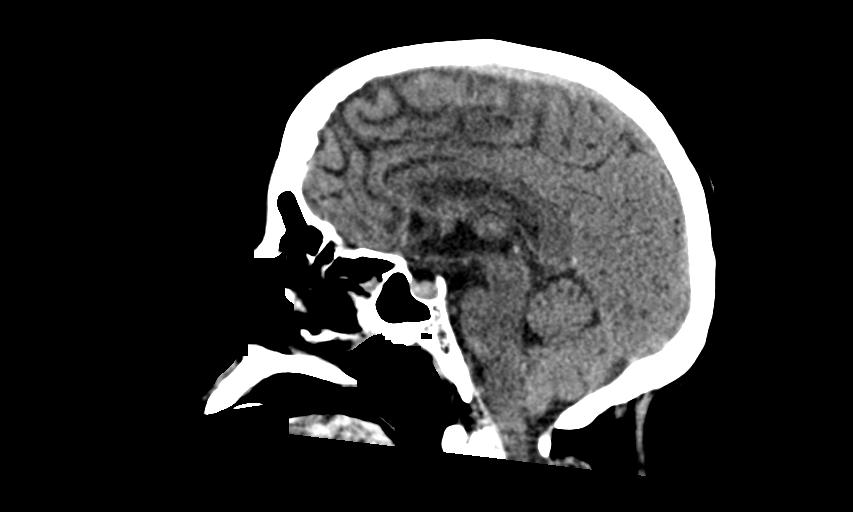
[im 44/66  brain]
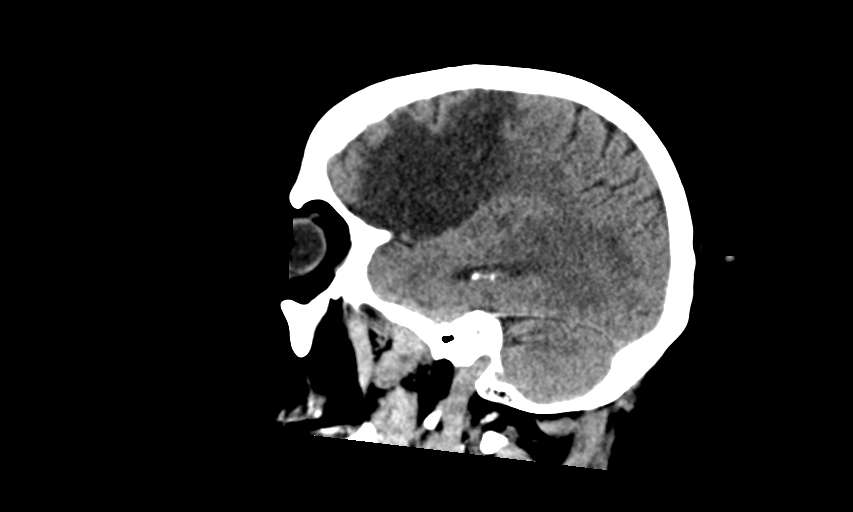

[13 of 47 positions shown; findings below may reference images not displayed]

FINDINGS: Brain:

Redemonstrated large chronic left MCA territory infarct centered
within the left frontal operculum, left insula and anterolateral
left frontal lobe. The extent of this infarct matches the extent of
restricted diffusion demonstrated on prior brain MRI [DATE].

No acute intracranial hemorrhage or acute infarct is identified.

No extra-axial fluid collection.

No evidence of intracranial mass.

No midline shift.

Redemonstrated scattered parenchymal calcifications within the
brain.

Vascular: No hyperdense vessel.

Skull: Normal. Negative for fracture or focal lesion.

Sinuses/Orbits: Visualized orbits show no acute finding. Mild
ethmoid and left maxillary sinus mucosal thickening. Left middle
ear/mastoid effusion.

Other: Partially visualized support tubes.
IMPRESSION: No CT evidence of acute intracranial abnormality.

Redemonstrated large chronic left frontal lobe infarct.

Scattered parenchymal calcifications which are nonspecific, but may
reflect chronic sequela of neurocysticercosis.

Mild ethmoid and left maxillary sinus mucosal thickening.

Large left middle ear/mastoid effusion.

## 2019-12-21 IMAGING — DX DG ABD PORTABLE 1V
1 series · 4 of 4 positions shown · non-contrast
Comparison: None.

CLINICAL DATA: NG tube placement

EXAM:
PORTABLE ABDOMEN - 1 VIEW

[Series 1: abdomen · 0.14mm/px · 4 of 4 slices shown]
[im 1/4]
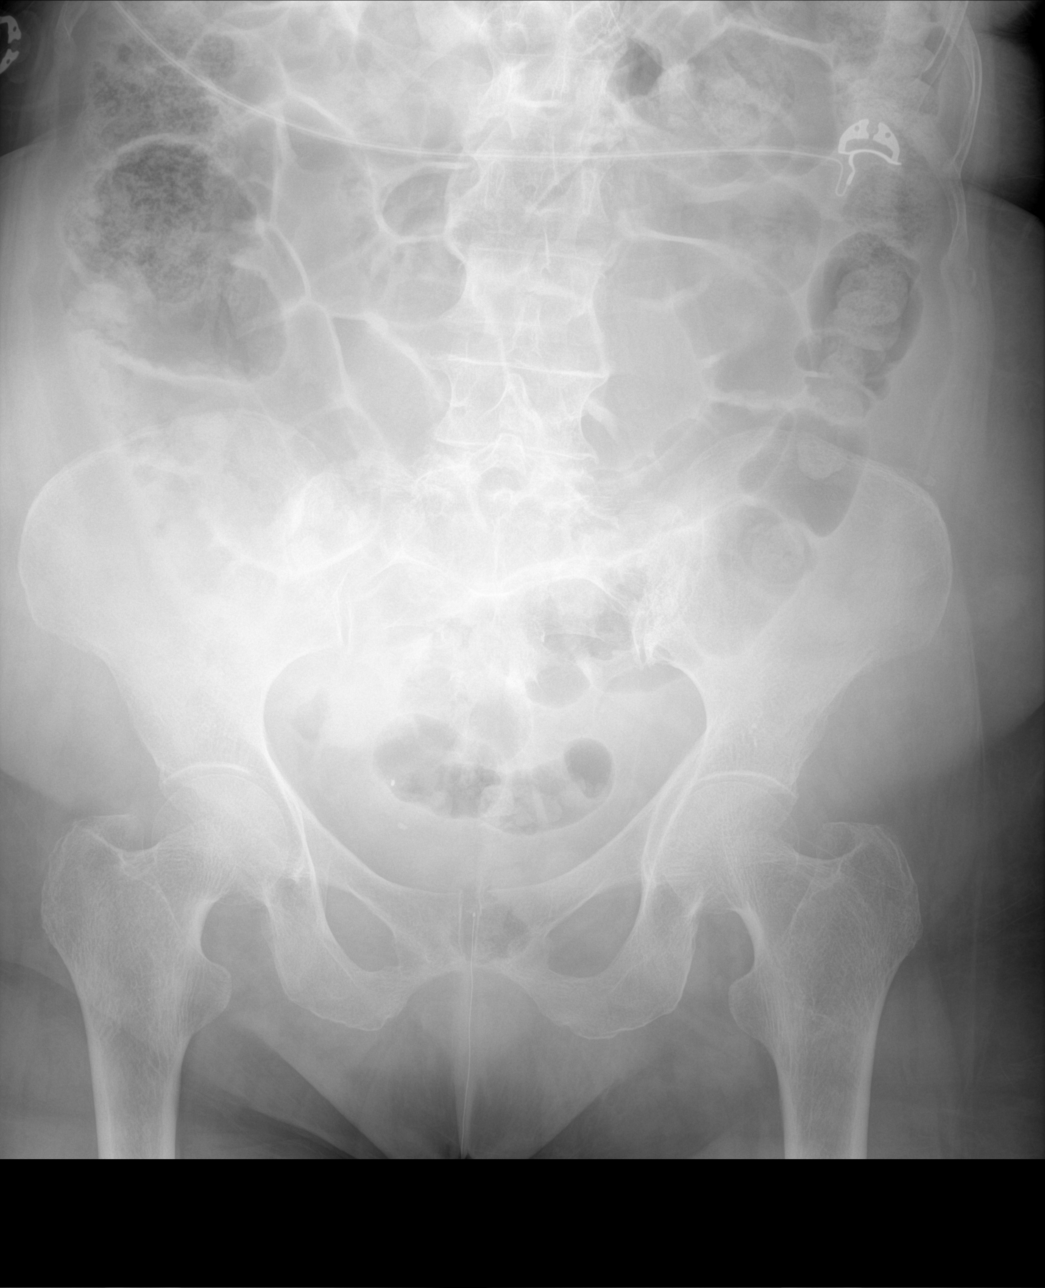
[im 2/4]
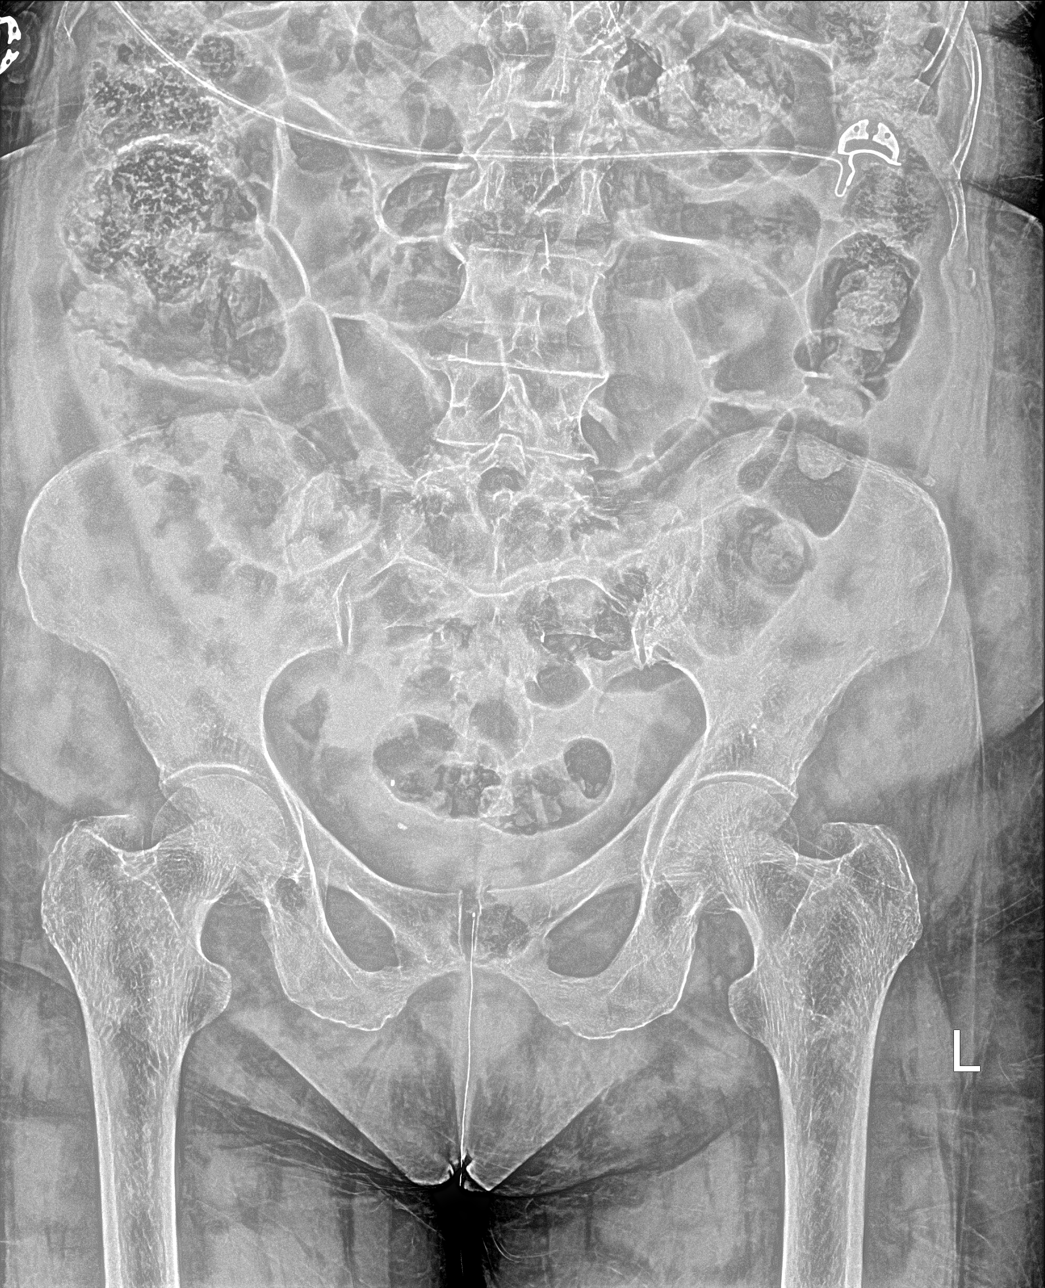
[im 3/4]
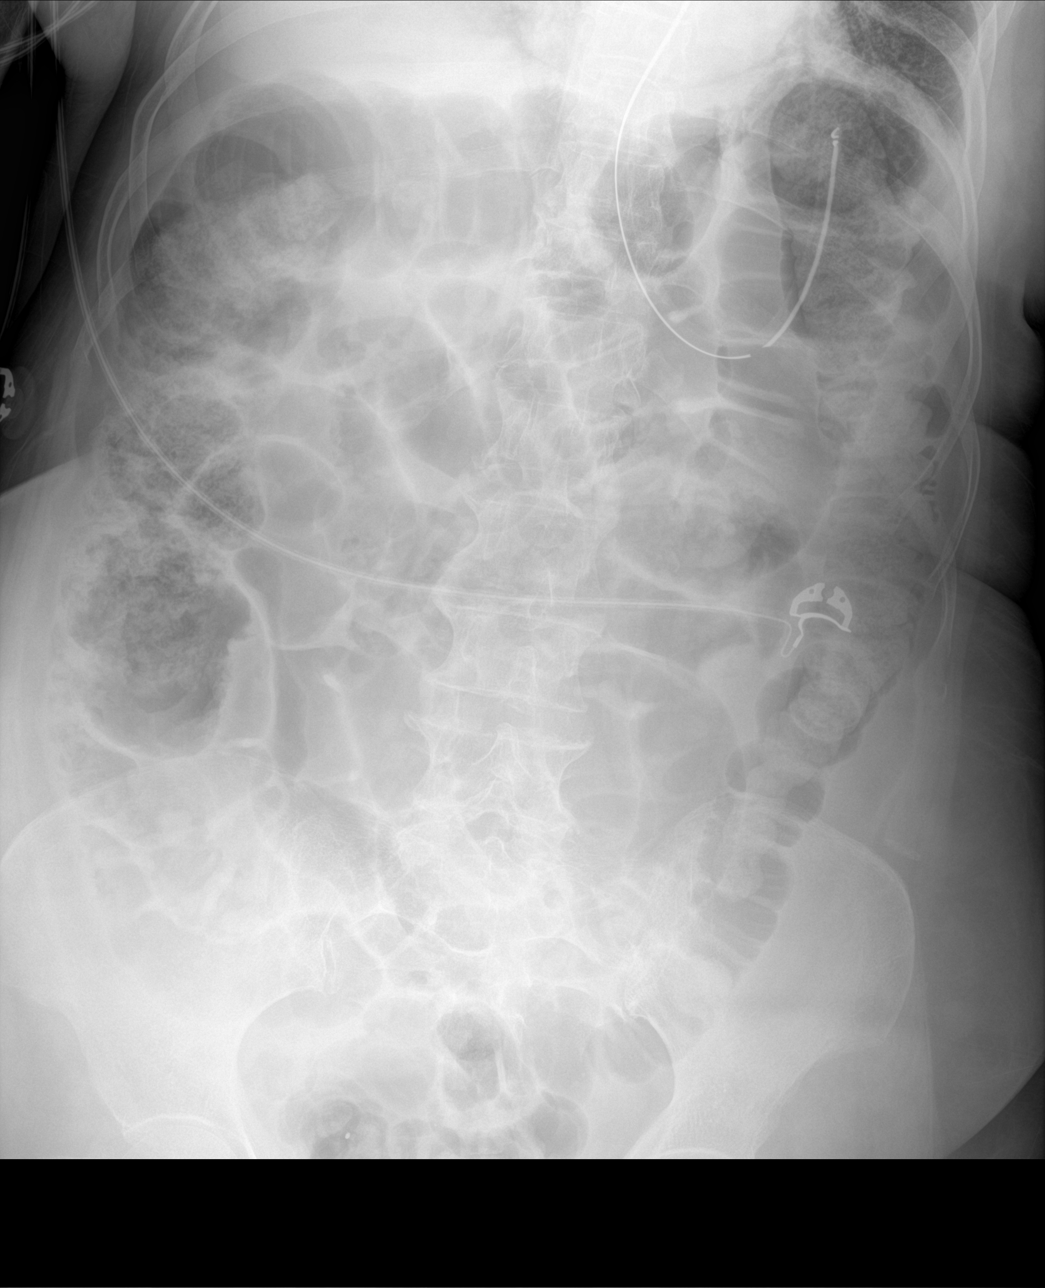
[im 4/4]
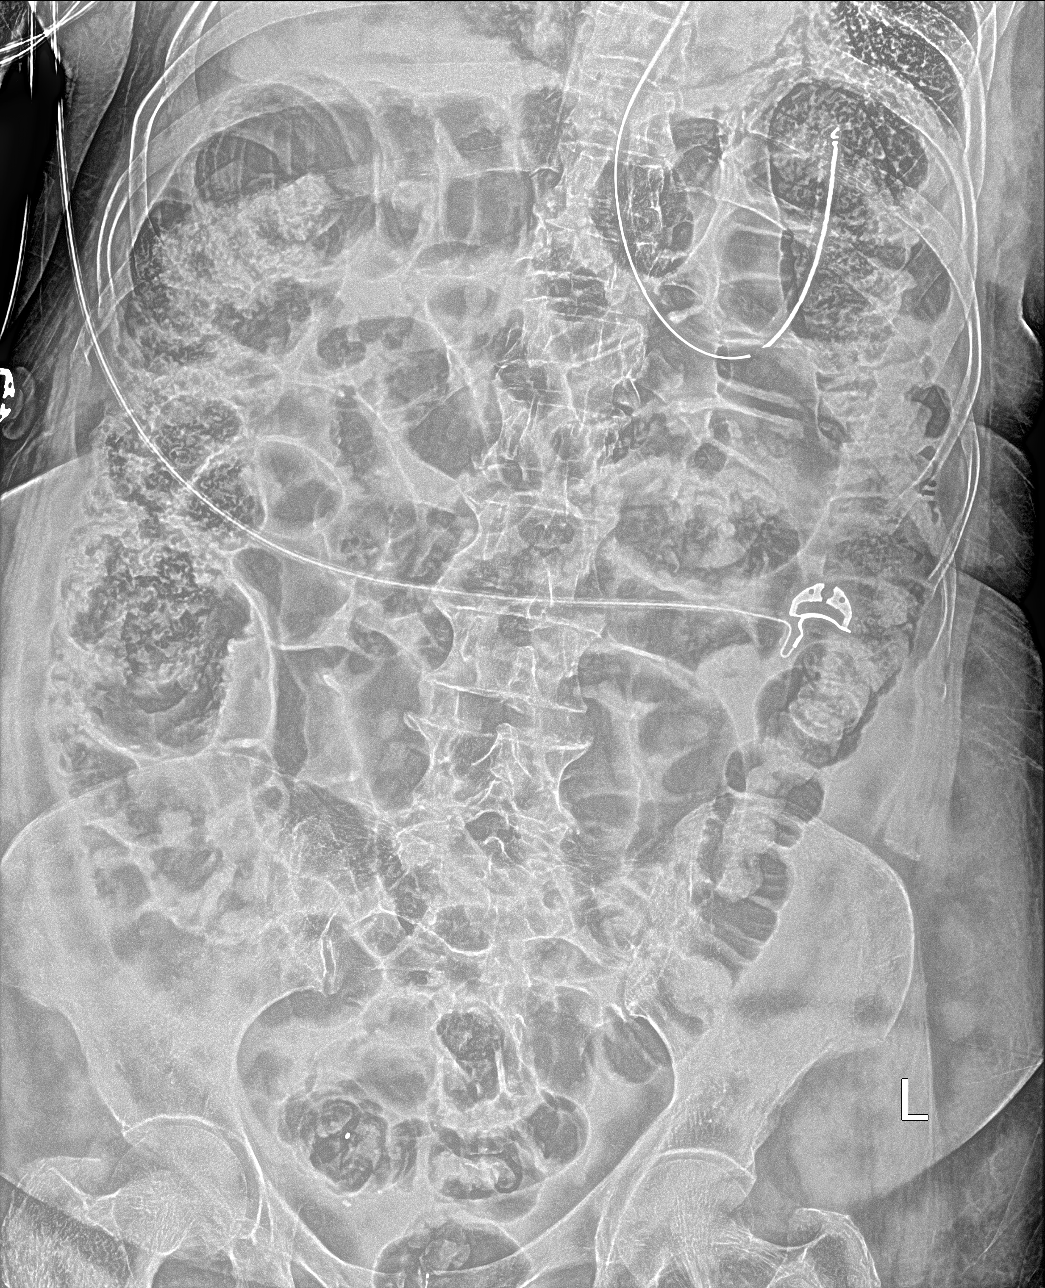

[4 of 4 positions shown; findings below may reference images not displayed]

FINDINGS: Esophageal tube tip and side-port project over the proximal stomach.
Moderate diffuse gaseous enlargement large and small bowel, small
bowel dilated up to 5.3 cm. Gas is present at the rectum. No
radiopaque calculi are seen.
IMPRESSION: 1. Esophageal tube tip and side-port project over the proximal
stomach.
2. Moderate diffuse gaseous enlargement of the bowel suggestive of
ileus.

## 2019-12-21 MED ORDER — SODIUM CHLORIDE 0.9 % IV SOLN
500.0000 mg | Freq: Once | INTRAVENOUS | Status: DC
  Administered 2019-12-21: 500 mg via INTRAVENOUS
  Filled 2019-12-21: qty 500

## 2019-12-21 MED ORDER — ORAL CARE MOUTH RINSE
15.0000 mL | OROMUCOSAL | Status: DC
  Administered 2019-12-21 – 2019-12-28 (×61): 15 mL via OROMUCOSAL

## 2019-12-21 MED ORDER — SODIUM CHLORIDE 0.9 % IV SOLN
1.0000 g | Freq: Once | INTRAVENOUS | Status: AC
  Administered 2019-12-21: 1 g via INTRAVENOUS
  Filled 2019-12-21: qty 10

## 2019-12-21 MED ORDER — CHLORHEXIDINE GLUCONATE 0.12% ORAL RINSE (MEDLINE KIT)
15.0000 mL | Freq: Two times a day (BID) | OROMUCOSAL | Status: DC
  Administered 2019-12-22 – 2019-12-28 (×14): 15 mL via OROMUCOSAL

## 2019-12-21 MED ORDER — METHYLPREDNISOLONE SODIUM SUCC 125 MG IJ SOLR
60.0000 mg | Freq: Three times a day (TID) | INTRAMUSCULAR | Status: DC
  Administered 2019-12-21 – 2019-12-22 (×3): 60 mg via INTRAVENOUS
  Filled 2019-12-21 (×3): qty 2

## 2019-12-21 MED ORDER — SODIUM CHLORIDE 0.9 % IV SOLN
2.0000 g | INTRAVENOUS | Status: AC
  Administered 2019-12-22 – 2019-12-26 (×5): 2 g via INTRAVENOUS
  Filled 2019-12-21 (×5): qty 20

## 2019-12-21 MED ORDER — POLYETHYLENE GLYCOL 3350 17 G PO PACK: 17.0000 g | PACK | Freq: Every day | ORAL | Status: DC | PRN

## 2019-12-21 MED ORDER — POLYETHYLENE GLYCOL 3350 17 G PO PACK
17.0000 g | PACK | Freq: Every day | ORAL | Status: DC
  Administered 2019-12-22 – 2019-12-24 (×3): 17 g via ORAL
  Filled 2019-12-21 (×3): qty 1

## 2019-12-21 MED ORDER — ACETAMINOPHEN 160 MG/5ML PO SOLN
650.0000 mg | ORAL | Status: DC | PRN
  Administered 2019-12-25 (×2): 650 mg
  Filled 2019-12-21 (×2): qty 20.3

## 2019-12-21 MED ORDER — DOCUSATE SODIUM 50 MG/5ML PO LIQD
100.0000 mg | Freq: Two times a day (BID) | ORAL | Status: DC
  Filled 2019-12-21 (×2): qty 10

## 2019-12-21 MED ORDER — DOCUSATE SODIUM 100 MG PO CAPS: 100.0000 mg | ORAL_CAPSULE | Freq: Two times a day (BID) | ORAL | Status: DC | PRN

## 2019-12-21 MED ORDER — VANCOMYCIN HCL IN DEXTROSE 1-5 GM/200ML-% IV SOLN: 1000.0000 mg | INTRAVENOUS | Status: DC

## 2019-12-21 MED ORDER — ALBUTEROL SULFATE (2.5 MG/3ML) 0.083% IN NEBU
2.5000 mg | INHALATION_SOLUTION | RESPIRATORY_TRACT | Status: DC
  Filled 2019-12-21: qty 3

## 2019-12-21 MED ORDER — FAMOTIDINE IN NACL 20-0.9 MG/50ML-% IV SOLN
20.0000 mg | Freq: Every day | INTRAVENOUS | Status: DC
  Administered 2019-12-21: 20 mg via INTRAVENOUS
  Filled 2019-12-21: qty 50

## 2019-12-21 MED ORDER — CHLORHEXIDINE GLUCONATE CLOTH 2 % EX PADS
6.0000 | MEDICATED_PAD | Freq: Every day | CUTANEOUS | Status: DC
  Administered 2019-12-21 – 2020-01-03 (×14): 6 via TOPICAL

## 2019-12-21 MED ORDER — HEPARIN (PORCINE) 25000 UT/250ML-% IV SOLN
650.0000 [IU]/h | INTRAVENOUS | Status: DC
  Administered 2019-12-21: 850 [IU]/h via INTRAVENOUS
  Administered 2019-12-23 – 2019-12-25 (×2): 550 [IU]/h via INTRAVENOUS
  Administered 2019-12-27: 650 [IU]/h via INTRAVENOUS
  Filled 2019-12-21 (×4): qty 250

## 2019-12-21 MED ORDER — SODIUM CHLORIDE 0.9 % IV SOLN
2.0000 g | Freq: Two times a day (BID) | INTRAVENOUS | Status: DC
  Filled 2019-12-21: qty 2

## 2019-12-21 MED ORDER — VANCOMYCIN HCL 1500 MG/300ML IV SOLN
1500.0000 mg | Freq: Once | INTRAVENOUS | Status: DC
  Administered 2019-12-21: 1500 mg via INTRAVENOUS
  Filled 2019-12-21: qty 300

## 2019-12-21 MED ORDER — FENTANYL CITRATE (PF) 100 MCG/2ML IJ SOLN
25.0000 ug | INTRAMUSCULAR | Status: DC | PRN
  Administered 2019-12-21 – 2019-12-22 (×3): 50 ug via INTRAVENOUS
  Administered 2019-12-22: 100 ug via INTRAVENOUS
  Administered 2019-12-22 (×2): 50 ug via INTRAVENOUS
  Administered 2019-12-23 – 2019-12-24 (×5): 100 ug via INTRAVENOUS
  Filled 2019-12-21 (×12): qty 2

## 2019-12-21 MED ORDER — SODIUM CHLORIDE 0.9 % IV SOLN: INTRAVENOUS | Status: DC

## 2019-12-21 MED ORDER — IPRATROPIUM-ALBUTEROL 0.5-2.5 (3) MG/3ML IN SOLN: 3.0000 mL | RESPIRATORY_TRACT | Status: DC | PRN

## 2019-12-21 MED ORDER — INSULIN ASPART 100 UNIT/ML ~~LOC~~ SOLN
0.0000 [IU] | SUBCUTANEOUS | Status: DC
  Administered 2019-12-21 – 2019-12-22 (×6): 4 [IU] via SUBCUTANEOUS
  Administered 2019-12-22: 7 [IU] via SUBCUTANEOUS
  Administered 2019-12-23 (×4): 4 [IU] via SUBCUTANEOUS
  Administered 2019-12-23: 7 [IU] via SUBCUTANEOUS
  Administered 2019-12-23: 4 [IU] via SUBCUTANEOUS
  Administered 2019-12-24: 11 [IU] via SUBCUTANEOUS
  Administered 2019-12-24: 4 [IU] via SUBCUTANEOUS
  Administered 2019-12-24 (×3): 7 [IU] via SUBCUTANEOUS
  Administered 2019-12-25: 4 [IU] via SUBCUTANEOUS
  Administered 2019-12-25: 11 [IU] via SUBCUTANEOUS
  Administered 2019-12-25: 4 [IU] via SUBCUTANEOUS
  Administered 2019-12-25 (×2): 7 [IU] via SUBCUTANEOUS
  Administered 2019-12-25: 4 [IU] via SUBCUTANEOUS
  Administered 2019-12-26: 7 [IU] via SUBCUTANEOUS
  Administered 2019-12-26: 4 [IU] via SUBCUTANEOUS
  Administered 2019-12-26 (×2): 7 [IU] via SUBCUTANEOUS
  Administered 2019-12-27 (×2): 4 [IU] via SUBCUTANEOUS
  Administered 2019-12-27: 3 [IU] via SUBCUTANEOUS
  Administered 2019-12-27 – 2019-12-28 (×2): 4 [IU] via SUBCUTANEOUS

## 2019-12-21 MED ORDER — PROPOFOL 1000 MG/100ML IV EMUL
INTRAVENOUS | Status: AC
  Filled 2019-12-21: qty 100

## 2019-12-21 MED ORDER — PROPOFOL 1000 MG/100ML IV EMUL
0.0000 ug/kg/min | INTRAVENOUS | Status: DC
  Administered 2019-12-22: 15 ug/kg/min via INTRAVENOUS
  Administered 2019-12-22: 20 ug/kg/min via INTRAVENOUS
  Administered 2019-12-23 (×2): 15 ug/kg/min via INTRAVENOUS
  Administered 2019-12-24: 20 ug/kg/min via INTRAVENOUS
  Administered 2019-12-24: 15 ug/kg/min via INTRAVENOUS
  Administered 2019-12-25: 25 ug/kg/min via INTRAVENOUS
  Administered 2019-12-25: 5 ug/kg/min via INTRAVENOUS
  Administered 2019-12-25 – 2019-12-26 (×2): 10 ug/kg/min via INTRAVENOUS
  Administered 2019-12-27: 20 ug/kg/min via INTRAVENOUS
  Administered 2019-12-27: 14 ug/kg/min via INTRAVENOUS
  Filled 2019-12-21 (×11): qty 100

## 2019-12-21 MED ORDER — IPRATROPIUM-ALBUTEROL 0.5-2.5 (3) MG/3ML IN SOLN
3.0000 mL | Freq: Four times a day (QID) | RESPIRATORY_TRACT | Status: DC
  Administered 2019-12-22 (×3): 3 mL via RESPIRATORY_TRACT
  Filled 2019-12-21 (×3): qty 3

## 2019-12-21 MED ORDER — SODIUM CHLORIDE 0.9 % IV SOLN
500.0000 mg | INTRAVENOUS | Status: DC
  Administered 2019-12-22 – 2019-12-25 (×4): 500 mg via INTRAVENOUS
  Filled 2019-12-21 (×5): qty 500

## 2019-12-21 MED ORDER — FENTANYL CITRATE (PF) 100 MCG/2ML IJ SOLN
25.0000 ug | INTRAMUSCULAR | Status: AC | PRN
  Administered 2019-12-21 – 2019-12-22 (×3): 25 ug via INTRAVENOUS
  Filled 2019-12-21 (×2): qty 2

## 2019-12-21 NOTE — ED Provider Notes (Signed)
[facility] EMERGENCY DEPARTMENT Provider Note   CSN: [id number] Arrival date & time: 12/21/19  1656     History Chief Complaint  Patient presents with  . Respiratory Distress    Jamie Mitchell is a 74 y.o. female who presents via EMS after being found unresponsive and in respiratory distress.  When EMS arrived O2 saturations 60s% on room air and was intubated on scene without RSI medications.  Medical history, associated symptoms limited by language barrier with family only 72 year old relative was [place] speaking and able to communicate with EMS.  EMS states they were initially concerned about possible allergic reaction and patient received 1 mg of epi IM.  On exam of oropharynx during intubation there was no swelling noted.  No reported trauma, no evidence of trauma.  Preliminary chart review does note recent hospitalization for respiratory distress, recurrent angioedema of the glottis.  Patient medications at bedside reviewed and significant for current antibiotic use (cefdinir).   The history is provided by the EMS personnel and medical records. The history is limited by the condition of the patient.       Past Medical History:  Diagnosis Date  . Atrial fibrillation ([place])   . 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 Hypertension   . Stroke ([place])   . Thyroid disease     Patient Active Problem List   Diagnosis Date Noted  . Glottic edema 12/02/2019  . Prolonged PTT (partial thromboplastin time) 11/19/2019  . 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 Esophageal dysmotility 11/18/2019  . Gastroesophageal reflux 11/18/2019  . 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 Edema glottis 11/17/2019  . Stridor 10/31/2019  . SOB (shortness of breath) 10/30/2019  . Subglottic edema 10/30/2019  . Renal failure 07/25/2019  . Prediabetes 07/25/2019  . 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 Aphasia as late effect of  cerebrovascular accident (CVA) 07/25/2019  . Dysphagia 07/25/2019  . 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 Acute respiratory failure with hypoxia ([place])   . Tachycardia-bradycardia syndrome ([place])   . Atrial fibrillation with RVR ([place])   . Hypertension 07/23/2015  . Hyperlipidemia 07/23/2015  . 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 Hypothyroidism 01/25/2014    Past Surgical History:  Procedure Laterality Date  . IR CT HEAD LTD  07/20/2019  . IR PERCUTANEOUS ART THROMBECTOMY/INFUSION INTRACRANIAL INC DIAG ANGIO  07/20/2019  . RADIOLOGY WITH ANESTHESIA N/A 07/20/2019   Procedure: IR WITH ANESTHESIA;  Surgeon: Luanne Bras, MD;  Location: [facility];  Service: Radiology;  Laterality: N/A;     OB History   No obstetric history on file.     Family History  Family history unknown: Yes    Social History   Tobacco Use  . Smoking status: Never Smoker  . Smokeless tobacco: Never Used  Substance Use Topics  . Alcohol use: No  . Drug use: No    Home Medications Prior to Admission medications   Medication Sig Start Date End Date Taking? Authorizing Provider  albuterol (VENTOLIN HFA) 108 (90 Base) MCG/ACT inhaler Inhale 1-2 puffs into the lungs every 6 (six) hours as needed for wheezing or shortness of breath. 11/28/19   Charlott Rakes, MD  amoxicillin (AMOXIL) 875 MG tablet Take by mouth 2 (two) times daily.    [provider]  atorvastatin (LIPITOR) 40 MG tablet Take 1 tablet (40 mg total) by mouth daily. Patient  taking differently: 40 mg every evening.  08/02/19   Angiulli, Lavon Paganini, PA-C  atorvastatin (LIPITOR) 80 MG tablet TOME 1 PASTILLA POR VIA ORAL POR VIA ORAL UNA VEZ AL DIA 12/06/19   Charlott Rakes, MD  cetirizine (ZYRTEC ALLERGY) 10 MG tablet Take 1 tablet (10 mg total) by mouth 2 (two) times daily. 11/20/19   Lyndee Hensen, DO  chlorpheniramine-HYDROcodone (TUSSIONEX  PENNKINETIC ER) 10-8 MG/5ML SUER Take 5 mLs by mouth every 12 (twelve) hours as needed for cough. 11/28/19   Charlott Rakes, MD  diclofenac Sodium (VOLTAREN) 1 % GEL Apply 2 g topically 4 (four) times daily. 11/15/19   Argentina Donovan, PA-C  enoxaparin (LOVENOX) 60 MG/0.6ML injection Inject 0.6 mLs (60 mg total) into the skin 2 (two) times daily. 12/04/19 01/03/20  Patriciaann Clan, DO  EPINEPHrine 0.3 mg/0.3 mL IJ SOAJ injection Inject 0.3 mLs (0.3 mg total) into the muscle as needed for anaphylaxis. 11/20/19   Brimage, Ronnette Juniper, DO  famotidine (PEPCID) 20 MG tablet Take 1 tablet (20 mg total) by mouth 2 (two) times daily. 11/20/19 11/19/20  Lyndee Hensen, DO  levothyroxine (SYNTHROID) 88 MCG tablet Take 1 tablet (88 mcg total) by mouth daily before breakfast. 11/29/19   Charlott Rakes, MD  metFORMIN (GLUCOPHAGE) 500 MG tablet Take 1 tablet (500 mg total) by mouth 2 (two) times daily with a meal. 12/04/19   Autry-Lott, Naaman Plummer, DO  omeprazole (PRILOSEC) 40 MG capsule Take 40 mg by mouth daily.    [provider]  predniSONE (DELTASONE) 20 MG tablet Take 2 tablets (40 mg total) by mouth daily with breakfast. 12/05/19   Autry-Lott, Naaman Plummer, DO    Allergies    Patient has no known allergies.  Review of Systems   Review of Systems  Unable to perform ROS: Intubated    Physical Exam Updated Vital Signs BP 105/79   Pulse 90   Temp (!) 100.7 F (38.2 C)   Resp 17   Ht 4' 11.02" (1.499 m)   Wt 66 kg   SpO2 97%   BMI 29.37 kg/m   Physical Exam Vitals and nursing note reviewed.  Constitutional:      General: She is in acute distress.     Appearance: Normal appearance. She is well-developed. She is obese. She is ill-appearing. She is not toxic-appearing or diaphoretic.     Interventions: She is sedated and intubated. Cervical collar in place.  HENT:     Head: Normocephalic and atraumatic.     Nose: Nose normal.     Mouth/Throat:     Mouth: Mucous membranes are moist.  Eyes:      Conjunctiva/sclera: Conjunctivae normal.     Pupils: Pupils are equal, round, and reactive to light.  Cardiovascular:     Rate and Rhythm: Normal rate and regular rhythm.     Heart sounds: Normal heart sounds. No murmur heard.   Pulmonary:     Effort: Pulmonary effort is normal. No respiratory distress. She is intubated.     Breath sounds: Normal breath sounds.  Abdominal:     General: Abdomen is flat.     Palpations: Abdomen is soft.  Musculoskeletal:        General: No deformity or signs of injury.     Cervical back: Neck supple.     Right lower leg: No edema.     Left lower leg: No edema.     Comments: No evidence of trauma.  Skin:    General: Skin is warm and  dry.     Findings: No rash.  Neurological:     GCS: GCS eye subscore is 2. GCS verbal subscore is 1. GCS motor subscore is 4.     ED Results / Procedures / Treatments   Labs (all labs ordered are listed, but only abnormal results are displayed) Labs Reviewed  CBC - Abnormal; Notable for the following components:      Result Value   WBC 11.8 (*)    RDW 16.6 (*)    All other components within normal limits  COMPREHENSIVE METABOLIC PANEL - Abnormal; Notable for the following components:   Sodium 131 (*)    Chloride 97 (*)    Glucose, Bld 306 (*)    Creatinine, Ser 1.22 (*)    Calcium 8.1 (*)    Total Protein 6.0 (*)    Albumin 2.9 (*)    ALT 49 (*)    GFR calc non Af Amer 45 (*)    GFR calc Af Amer 52 (*)    All other components within normal limits  URINALYSIS, ROUTINE W REFLEX MICROSCOPIC - Abnormal; Notable for the following components:   Glucose, UA >=500 (*)    Protein, ur 30 (*)    Bacteria, UA RARE (*)    All other components within normal limits  BRAIN NATRIURETIC PEPTIDE - Abnormal; Notable for the following components:   B Natriuretic Peptide 114.1 (*)    All other components within normal limits  CBG MONITORING, ED - Abnormal; Notable for the following components:   Glucose-Capillary 305 (*)      All other components within normal limits  I-STAT ARTERIAL BLOOD GAS, ED - Abnormal; Notable for the following components:   pH, Arterial 7.303 (*)    pCO2 arterial 52.4 (*)    pO2, Arterial 157 (*)    Sodium 131 (*)    Calcium, Ion 1.13 (*)    All other components within normal limits  SARS CORONAVIRUS 2 BY RT PCR (HOSPITAL ORDER, [facility])  URINE CULTURE  CULTURE, BLOOD (ROUTINE X 2)  CULTURE, BLOOD (ROUTINE X 2)  CULTURE, RESPIRATORY  ETHANOL  RAPID URINE DRUG SCREEN, HOSP PERFORMED  TSH  T4, FREE  LACTIC ACID, PLASMA  LACTIC ACID, PLASMA  PROTIME-INR  APTT  C-REACTIVE PROTEIN  LACTATE DEHYDROGENASE  PROCALCITONIN  PROCALCITONIN  TSH  D-DIMER, QUANTITATIVE (NOT AT [facility])  SEDIMENTATION RATE  BLOOD GAS, ARTERIAL  TRIGLYCERIDES  TROPONIN I (HIGH SENSITIVITY)  TROPONIN I (HIGH SENSITIVITY)    EKG EKG Interpretation  Date/Time:  Thursday December 21 2019 17:01:32 EDT Ventricular Rate:  98 PR Interval:    QRS Duration: 87 QT Interval:  364 QTC Calculation: 465 R Axis:   28 Text Interpretation: Atrial fibrillation Abnormal R-wave progression, early transition Minimal ST depression, anterolateral leads Confirmed by Quintella Reichert [id number]) on 12/21/2019 5:30:10 PM   Radiology CT Head Wo Contrast  Result Date: 12/21/2019 CLINICAL DATA:  Mental status change, unknown cause. EXAM: CT HEAD WITHOUT CONTRAST TECHNIQUE: Contiguous axial images were obtained from the base of the skull through the vertex without intravenous contrast. COMPARISON:  Head CT 07/31/2019, brain MRI 07/22/2019 FINDINGS: Brain: Redemonstrated large chronic left MCA territory infarct centered within the left frontal operculum, left insula and anterolateral left frontal lobe. The extent of this infarct matches the extent of restricted diffusion demonstrated on prior brain MRI 07/22/2019. No acute intracranial hemorrhage or acute infarct is identified. No extra-axial fluid  collection. No evidence of intracranial mass. No midline  shift. Redemonstrated scattered parenchymal calcifications within the brain. Vascular: No hyperdense vessel. Skull: Normal. Negative for fracture or focal lesion. Sinuses/Orbits: Visualized orbits show no acute finding. Mild ethmoid and left maxillary sinus mucosal thickening. Left middle ear/mastoid effusion. Other: Partially visualized support tubes. IMPRESSION: No CT evidence of acute intracranial abnormality. Redemonstrated large chronic left frontal lobe infarct. 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 Mild ethmoid and left maxillary sinus mucosal thickening. Large left middle ear/mastoid effusion. Electronically Signed   By: Kellie Simmering DO   On: 12/21/2019 18:38   DG Chest Port 1 View  Result Date: 12/21/2019 CLINICAL DATA:  74 year old female with respiratory failure. EXAM: PORTABLE CHEST 1 VIEW COMPARISON:  Chest radiograph dated 12/02/2019 and CT dated 12/02/2019 FINDINGS: Endotracheal tube with tip approximately 15 mm above the carina. Recommend retraction by approximately 3 cm for optimal positioning. Diffuse bilateral interstitial and hazy airspace density may represent multifocal pneumonia, edema, or ARDS. Clinical correlation is recommended. No pleural effusion or pneumothorax. The cardiac silhouette is within limits. No acute osseous pathology. Air distended stomach. IMPRESSION: 1. Endotracheal tube above the carina. Recommend retraction by approximately 3 cm for optimal positioning. 2. Diffuse interstitial and airspace densities. Electronically Signed   By: Anner Crete M.D.   On: 12/21/2019 17:36    Procedures Procedures (including critical care time)  Medications Ordered in ED Medications  cefTRIAXone (ROCEPHIN) 1 g in sodium chloride 0.9 % 100 mL IVPB (has no administration in time range)  azithromycin (ZITHROMAX) 500 mg in sodium chloride 0.9 % 250  mL IVPB (has no administration in time range)  vancomycin (VANCOREADY) IVPB 1500 mg/300 mL (has no administration in time range)    Followed by  vancomycin (VANCOCIN) IVPB 1000 mg/200 mL premix (has no administration in time range)  docusate (COLACE) 50 MG/5ML liquid 100 mg (has no administration in time range)  polyethylene glycol (MIRALAX / GLYCOLAX) packet 17 g (has no administration in time range)  famotidine (PEPCID) IVPB 20 mg premix (has no administration in time range)  chlorhexidine gluconate (MEDLINE KIT) (PERIDEX) 0.12 % solution 15 mL (has no administration in time range)  MEDLINE mouth rinse (has no administration in time range)  albuterol (PROVENTIL) (2.5 MG/3ML) 0.083% nebulizer solution 2.5 mg (has no administration in time range)  ipratropium-albuterol (DUONEB) 0.5-2.5 (3) MG/3ML nebulizer solution 3 mL (has no administration in time range)  fentaNYL (SUBLIMAZE) injection 25 mcg (has no administration in time range)  fentaNYL (SUBLIMAZE) injection 25-100 mcg (has no administration in time range)  propofol (DIPRIVAN) 1000 MG/100ML infusion (has no administration in time range)  propofol (DIPRIVAN) 1000 MG/100ML infusion (  New Bag/Given 12/21/19 1705)    ED Course  I have reviewed the triage vital signs and the nursing notes.  Pertinent labs & imaging results that were available during my care of the patient were reviewed by me and considered in my medical decision making (see chart for details).    MDM Rules/Calculators/A&P                          Patient is a ill-appearing 74 year old female in acute distress who presents via EMS for unresponsiveness, acute respiratory distress.  Patient was intubated prior to arrival on the scene without the need for RSI medications.  HPI/medical history limited by patient condition, language barrier with family.  Patient was quickly evaluated by myself and supervising physician. On arrival patient was found to be febrile  to 100.7,  intubated with bilateral breath sounds present, HDS. No obvious signs of trauma. Pupils 51m and equal. Abdomen soft, non distended. No obvious rash. No LE edema.  IV propofol was initiated for sedation.  Broad work-up initiated for infectious, altered mental status etiologies.  Blood glucose 305.  Concern for pulmonary infectious etiology in setting of chest x-ray significant for diffuse interstitial and airspace densities, low-grade fever to 100.7 on arrival, mild leukocytosis to 11.8 on CBC.  IV Rocephin initiated.  Metabolic abnormalities were also considered however CMP was significant for only mild elevation in creatinine to 1.22 from 0.91 approximately 2 weeks ago, no other metabolic derangements that could be leading to presentation. ACS was considered however initial troponin 15, EKG with A. fib without RVR, minimal ST depression in anterolateral leads however does not appear consistent with acute ischemic change at this time.  Second troponin will be sent.  Heart failure exacerbation was also considered however patient with out known history of CHF at this time, BNP only mildly elevated at 114, clinical exam not consistent with volume overload.  Alcohol/drug/polypharmacy was also considered however ethanol negative, UDS pending at this time.  CT head negative for acute intracranial abnormalities.  Urinary tract infection considered, UA pending at this time.  Covid pending.  Based on clinical setting, requiring intubation for acute respiratory failure will admit patient to ICU for further work-up and management at this time.  Patient mated to ICU.   Final Clinical Impression(s) / ED Diagnoses Final diagnoses:  Acute respiratory failure with hypoxia ([facility]    Rx / DC Orders ED Discharge Orders    None       PKennyth Lose MD 12/22/19 1346    RQuintella Reichert MD 12/22/19 1539

## 2019-12-21 NOTE — ED Notes (Signed)
RT called for transport to ICU.

## 2019-12-21 NOTE — ED Triage Notes (Signed)
Pt here from home via EMS, family found her sitting in the recliner guppy breathing. EMS arrived and and SpO2 60% and capnography 80s. Assisted ventilations via BVM, suctioning blood out of airway. Pt intubated, given 5 mg versed for comfort d/t coughing against the tube. Was just seen at her doctor today.

## 2019-12-21 NOTE — Progress Notes (Signed)
eLink Physician-Brief Progress Note Patient Name: Jamie Mitchell DOB: 31-Dec-1945 MRN: [id number]   Date of Service  12/21/2019  HPI/Events of Note  Patient needs PRN Tylenol order.  eICU Interventions  Tylenol ordered.     Intervention Category Intermediate Interventions: Pain - evaluation and management  Frederik Pear 12/21/2019, 11:25 PM

## 2019-12-21 NOTE — Progress Notes (Signed)
eLink Physician-Brief Progress Note Patient Name: Jamie Mitchell DOB: Oct 10, 1945 MRN: [id number]   Date of Service  12/21/2019  HPI/Events of Note  Patient admitted with acute hypoxemic respiratory failure secondary to suspected aspiration pneumonia and ARDS, patient also meets criteria for septic shock. She is on the ventilator on broad spectrum antibiotics.  eICU Interventions  New Patient Evaluation completed.        Kerry Kass Giavonni Cizek 12/21/2019, 11:15 PM

## 2019-12-21 NOTE — Progress Notes (Addendum)
Pharmacy Antibiotic Note  Jamie Mitchell is a 74 y.o. female admitted on 12/21/2019 with pneumonia.  Pharmacy has been consulted for Vancomycin dosing.   Height: 4' 11.02" (149.9 cm) Weight: 66 kg (145 lb 8.1 oz) IBW/kg (Calculated) : 43.24  Temp (24hrs), Avg:99.9 F (37.7 C), Min:95.6 F (35.3 C), Max:100.7 F (38.2 C)  Recent Labs  Lab 12/21/19 1708  WBC 11.8*  CREATININE 1.22*    Estimated Creatinine Clearance: 34.9 mL/min (A) (by C-G formula based on SCr of 1.22 mg/dL (H)).    No Known Allergies  Antimicrobials this admission: 7/29 Vancomycin >>   Dose adjustments this admission: N/a  Microbiology results: Pending   Plan:  - Cefepime 2g q12h - Vancomycin 1500mg  IV x 1 dose  - Followed by Vancomycin 1000mg  IV q24h (nomogram dosing) - Monitor patients renal function and urine output  - De-escalate ABX when appropriate   Anticoagulation Plan:  HDW: 57.6 kg  - Patient is on Lovenox PTA for afib last dose this morning per family - Will start heparin drip at this time without a bolus as last dose of lovenox was this morning  - Heparin drip @ 850 units/hr  - Obtain heparin level in ~ 6 hr - Monitor patient for s/s of bleeding and cbc while on heparin.  Thank you for allowing pharmacy to be a part of this patient's care.  Duanne Limerick PharmD. BCPS 12/21/2019 6:42 PM

## 2019-12-21 NOTE — H&P (Signed)
NAME:  Jamie Mitchell, MRN:  [id number], DOB:  11/14/1945, LOS: 0 ADMISSION DATE:  12/21/2019, CONSULTATION DATE:  12/21/2019 REFERRING MD:  Dr. Ralene Bathe, CHIEF COMPLAINT:  Acute respiratory failure   Brief History   74yo female with known history of recurrent stridor and subglottic edema/stinosis presents with acute hypoxic respiratory failure resulting in intubation by EMS. PCCM consulted for further management and admission.   History of present illness   Jamie Mitchell is a 74yo female with PMX significant for thyroid disease, stroke, hypertension, chronic mastoiditis, and atrial fibrillation who presented to ED with acute hypoxic respiratory failure. Per EMS they were intimally called for complaints of shortness of breath, on arrival patient was found with agonal breathing and significant hypoxia with SPO2 of 60% on RA. EMS intubated on arrival without use of any medications.   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 During this admission she was seen by ENT and PCCM and treated with BIPAP and steroids.   On arrival vital signs within normal limit apart from mild tachycardia. Labwork significant for sodium 131, glucose 306, creatine 1.22, albumin 2.9, BNP 114.1, and WBC 11.8.   Past Medical History  Thyroid disease Stroke Hypertension Chronic mastoiditis A-fib  Significant Hospital Events   Admitted with acute hypoxic respiratory failure, intubated per EMS  Consults:  ENT   Procedures:  ETT 7/29 >  Significant Diagnostic Tests:  CT head 7/29 > No CT evidence of acute intracranial abnormality. Redemonstrated large chronic left frontal lobe infarct. 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 Mild ethmoid and left maxillary sinus mucosal thickening. Large left middle ear/mastoid effusion. PFT 12/19/2019 ratio 69% FEV1 80% FVC 139%  DLCO 118%   Micro Data:  Blood 7/29 > Sputum 7/29 > Urine 7/29 >  Antimicrobials:  Azithro 7/29 Rocephin 7/29 Cefepime 7/29 > Vanc 7/29>   Interim history/subjective:  Intubated in ED   Objective   Blood pressure 105/79, pulse 90, temperature (!) 100.7 F (38.2 C), resp. rate 17, SpO2 97 %.    Vent Mode: PRVC FiO2 (%):  [100 %] 100 % Set Rate:  [20 bmp] 20 bmp Vt Set:  [340 mL] 340 mL PEEP:  [5 cmH20] 5 cmH20 Plateau Pressure:  [13 cmH20] 13 cmH20  No intake or output data in the 24 hours ending 12/21/19 1825 There were no vitals filed for this visit.  Examination: General: Elderly female on vent HENT: [place]/AT, PERRL no JVD. ETT. Lungs: Clear bilateral Cardiovascular: RRR, no MRG Abdomen: Soft, non-tender, non-distended.  Extremities: No acute deformity Neuro: RASS -1. Patient does not speak english.   Resolved Hospital Problem list     Assessment & Plan:  Acute respiratory failure with hypoxia, hypercarbia, requiring mechanical ventilation: Found by EMS with SpO2 60s. CXR with diffuse opacities. Pink/red sputum, pulm edema vs traumatic intubation. Reasonable she could have negative pressure pulm edema in the setting of tracheal stenosis. Family did hear a strong stridor. She also has a history of CVA with known aspiration and has recently been hospitalized.  - Full vent support - Empiric antibiotics, HCAP - Trend ABG, CXR - VAP bundle  Hx Subglottic stenosis and glottic edema, PFTs as above.  -consult ENT 7/30 AM  -Solumedrol  Prior L MCA embolic CVA: residual dysphagia, known aspiration -supportive care -SLP eval when extubated   Afib: previously on amio, eliquis-- since discontinued with concern that this may have contributed to prior  angioedema. Now on full dose lovenox BID at home P -holding lovenox for now until we are sure she is not having hemoptysis. Bright red sputum in ET tubing.   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  -SSI  resistant  GERD -pepcid   Best practice:  Diet: NPO  Pain/Anxiety/Delirium protocol (if indicated): Prop, PRN fent  VAP protocol (if indicated): yes DVT prophylaxis: SCDs GI prophylaxis: pepcid  Glucose control: SSI  Mobility: BR Code Status: Full  Family Communication: Daughter in law updated using interpreter box.  Disposition: ICU   Labs   CBC: Recent Labs  Lab 12/21/19 1708 12/21/19 1711  WBC 11.8*  --   HGB 12.8 13.3  HCT 40.8 39.0  MCV 92.1  --   PLT 226  --     Basic Metabolic Panel: Recent Labs  Lab 12/21/19 1708 12/21/19 1711  NA 131* 131*  K 4.1 3.8  CL 97*  --   CO2 22  --   GLUCOSE 306*  --   BUN 11  --   CREATININE 1.22*  --   CALCIUM 8.1*  --    GFR: CrCl cannot be calculated (Unknown ideal weight.). Recent Labs  Lab 12/21/19 1708  WBC 11.8*    Liver Function Tests: Recent Labs  Lab 12/21/19 1708  AST 34  ALT 49*  ALKPHOS 49  BILITOT 0.7  PROT 6.0*  ALBUMIN 2.9*   No results for input(s): LIPASE, AMYLASE in the last 168 hours. No results for input(s): AMMONIA in the last 168 hours.  ABG    Component Value Date/Time   PHART 7.303 (L) 12/21/2019 1711   PCO2ART 52.4 (H) 12/21/2019 1711   PO2ART 157 (H) 12/21/2019 1711   HCO3 26.0 12/21/2019 1711   TCO2 28 12/21/2019 1711   ACIDBASEDEF 1.0 12/21/2019 1711   O2SAT 99.0 12/21/2019 1711     Coagulation Profile: No results for input(s): INR, PROTIME in the last 168 hours.  Cardiac Enzymes: No results for input(s): CKTOTAL, CKMB, CKMBINDEX, TROPONINI in the last 168 hours.  HbA1C: HbA1c, POC (controlled diabetic range)  Date/Time Value Ref Range Status  11/28/2019 11:06 AM 6.6 0.0 - 7.0 % Final   Hgb A1c MFr Bld  Date/Time Value Ref Range Status  12/02/2019 05:54 PM 7.1 (H) 4.8 - 5.6 % Final    Comment:    (NOTE) Pre diabetes:          5.7%-6.4%  Diabetes:              >6.4%  Glycemic control for   <7.0% adults with diabetes   07/20/2019 03:56 AM 6.1 (H) 4.8  - 5.6 % Final    Comment:    (NOTE) Pre diabetes:          5.7%-6.4% Diabetes:              >6.4% Glycemic control for   <7.0% adults with diabetes   07/20/2019 03:56 AM 6.0 (H) 4.8 - 5.6 % Final    Comment:    (NOTE) Pre diabetes:          5.7%-6.4% Diabetes:              >6.4% Glycemic control for   <7.0% adults with diabetes     CBG: Recent Labs  Lab 12/21/19 1701  GLUCAP 305*    Review of Systems:   Patient is encephalopathic and/or intubated. Therefore history has been obtained from chart review.   Past Medical History  She,  has a past medical  history of Atrial fibrillation ([place]), Chronic mastoiditis (11/18/2019), Hypertension, Stroke [facility]), and Thyroid disease.   Surgical History    Past Surgical History:  Procedure Laterality Date  . IR CT HEAD LTD  07/20/2019  . IR PERCUTANEOUS ART THROMBECTOMY/INFUSION INTRACRANIAL INC DIAG ANGIO  07/20/2019  . RADIOLOGY WITH ANESTHESIA N/A 07/20/2019   Procedure: IR WITH ANESTHESIA;  Surgeon: Luanne Bras, MD;  Location: [place];  Service: Radiology;  Laterality: N/A;     Social History   reports that she has never smoked. She has never used smokeless tobacco. She reports that she does not drink alcohol and does not use drugs.   Family History   Her Family history is unknown by patient.   Allergies No Known Allergies   Home Medications  Prior to Admission medications   Medication Sig Start Date End Date Taking? Authorizing Provider  albuterol (VENTOLIN HFA) 108 (90 Base) MCG/ACT inhaler Inhale 1-2 puffs into the lungs every 6 (six) hours as needed for wheezing or shortness of breath. 11/28/19   Charlott Rakes, MD  amoxicillin (AMOXIL) 875 MG tablet Take by mouth 2 (two) times daily.    [provider]  atorvastatin (LIPITOR) 40 MG tablet Take 1 tablet (40 mg total) by mouth daily. Patient taking differently: 40 mg every evening.  08/02/19   Angiulli, Lavon Paganini, PA-C  atorvastatin (LIPITOR) 80 MG tablet TOME 1  PASTILLA POR VIA ORAL POR VIA ORAL UNA VEZ AL DIA 12/06/19   Charlott Rakes, MD  cetirizine (ZYRTEC ALLERGY) 10 MG tablet Take 1 tablet (10 mg total) by mouth 2 (two) times daily. 11/20/19   Lyndee Hensen, DO  chlorpheniramine-HYDROcodone (TUSSIONEX PENNKINETIC ER) 10-8 MG/5ML SUER Take 5 mLs by mouth every 12 (twelve) hours as needed for cough. 11/28/19   Charlott Rakes, MD  diclofenac Sodium (VOLTAREN) 1 % GEL Apply 2 g topically 4 (four) times daily. 11/15/19   Argentina Donovan, PA-C  enoxaparin (LOVENOX) 60 MG/0.6ML injection Inject 0.6 mLs (60 mg total) into the skin 2 (two) times daily. 12/04/19 01/03/20  Patriciaann Clan, DO  EPINEPHrine 0.3 mg/0.3 mL IJ SOAJ injection Inject 0.3 mLs (0.3 mg total) into the muscle as needed for anaphylaxis. 11/20/19   Brimage, Ronnette Juniper, DO  famotidine (PEPCID) 20 MG tablet Take 1 tablet (20 mg total) by mouth 2 (two) times daily. 11/20/19 11/19/20  Lyndee Hensen, DO  levothyroxine (SYNTHROID) 88 MCG tablet Take 1 tablet (88 mcg total) by mouth daily before breakfast. 11/29/19   Charlott Rakes, MD  metFORMIN (GLUCOPHAGE) 500 MG tablet Take 1 tablet (500 mg total) by mouth 2 (two) times daily with a meal. 12/04/19   Autry-Lott, Naaman Plummer, DO  omeprazole (PRILOSEC) 40 MG capsule Take 40 mg by mouth daily.    [provider]  predniSONE (DELTASONE) 20 MG tablet Take 2 tablets (40 mg total) by mouth daily with breakfast. 12/05/19   Autry-Lott, Naaman Plummer, DO     Critical care time: 45 min     Georgann Housekeeper, AGACNP-BC [place] for personal pager PCCM on call pager [phone]  12/21/2019 8:13 PM

## 2019-12-22 ENCOUNTER — Ambulatory Visit: Payer: Medicare Other | Attending: Internal Medicine | Admitting: Internal Medicine

## 2019-12-22 ENCOUNTER — Inpatient Hospital Stay (HOSPITAL_COMMUNITY): Payer: Medicare Other

## 2019-12-22 DIAGNOSIS — J9601 Acute respiratory failure with hypoxia: Secondary | ICD-10-CM

## 2019-12-22 LAB — CBC
HCT: 34.9 % — ABNORMAL LOW (ref 36.0–46.0)
Hemoglobin: 11.1 g/dL — ABNORMAL LOW (ref 12.0–15.0)
MCH: 28.7 pg (ref 26.0–34.0)
MCHC: 31.8 g/dL (ref 30.0–36.0)
MCV: 90.2 fL (ref 80.0–100.0)
Platelets: 197 10*3/uL (ref 150–400)
RBC: 3.87 MIL/uL (ref 3.87–5.11)
RDW: 16.9 % — ABNORMAL HIGH (ref 11.5–15.5)
WBC: 10.6 10*3/uL — ABNORMAL HIGH (ref 4.0–10.5)
nRBC: 0 % (ref 0.0–0.2)

## 2019-12-22 LAB — HEPARIN LEVEL (UNFRACTIONATED)
Heparin Unfractionated: 1.03 IU/mL — ABNORMAL HIGH (ref 0.30–0.70)
Heparin Unfractionated: 0.9 IU/mL — ABNORMAL HIGH (ref 0.30–0.70)
Heparin Unfractionated: 0.61 IU/mL (ref 0.30–0.70)

## 2019-12-22 LAB — MRSA PCR SCREENING: MRSA by PCR: NEGATIVE

## 2019-12-22 LAB — T4, FREE: Free T4: 1.66 ng/dL — ABNORMAL HIGH (ref 0.61–1.12)

## 2019-12-22 LAB — URINE CULTURE: Culture: NO GROWTH

## 2019-12-22 LAB — MAGNESIUM
Magnesium: 1.7 mg/dL (ref 1.7–2.4)
Magnesium: 1.8 mg/dL (ref 1.7–2.4)

## 2019-12-22 LAB — GLUCOSE, CAPILLARY
Glucose-Capillary: 163 mg/dL — ABNORMAL HIGH (ref 70–99)
Glucose-Capillary: 177 mg/dL — ABNORMAL HIGH (ref 70–99)
Glucose-Capillary: 189 mg/dL — ABNORMAL HIGH (ref 70–99)
Glucose-Capillary: 190 mg/dL — ABNORMAL HIGH (ref 70–99)
Glucose-Capillary: 190 mg/dL — ABNORMAL HIGH (ref 70–99)
Glucose-Capillary: 239 mg/dL — ABNORMAL HIGH (ref 70–99)
Glucose-Capillary: 78 mg/dL (ref 70–99)

## 2019-12-22 LAB — SEDIMENTATION RATE: Sed Rate: 23 mm/hr — ABNORMAL HIGH (ref 0–22)

## 2019-12-22 LAB — PHOSPHORUS
Phosphorus: 3.6 mg/dL (ref 2.5–4.6)
Phosphorus: 3.9 mg/dL (ref 2.5–4.6)

## 2019-12-22 LAB — TRIGLYCERIDES: Triglycerides: 57 mg/dL (ref <150)

## 2019-12-22 LAB — TSH: TSH: 4.973 u[IU]/mL — ABNORMAL HIGH (ref 0.350–4.500)

## 2019-12-22 LAB — C-REACTIVE PROTEIN: CRP: 2.1 mg/dL — ABNORMAL HIGH (ref <1.0)

## 2019-12-22 LAB — PROCALCITONIN: Procalcitonin: 1.19 ng/mL

## 2019-12-22 IMAGING — DX DG CHEST 1V PORT
1 series · 1 of 1 positions shown · non-contrast
Comparison: [DATE].  [DATE].

CLINICAL DATA: Intubation.

EXAM:
PORTABLE CHEST 1 VIEW

[chest]
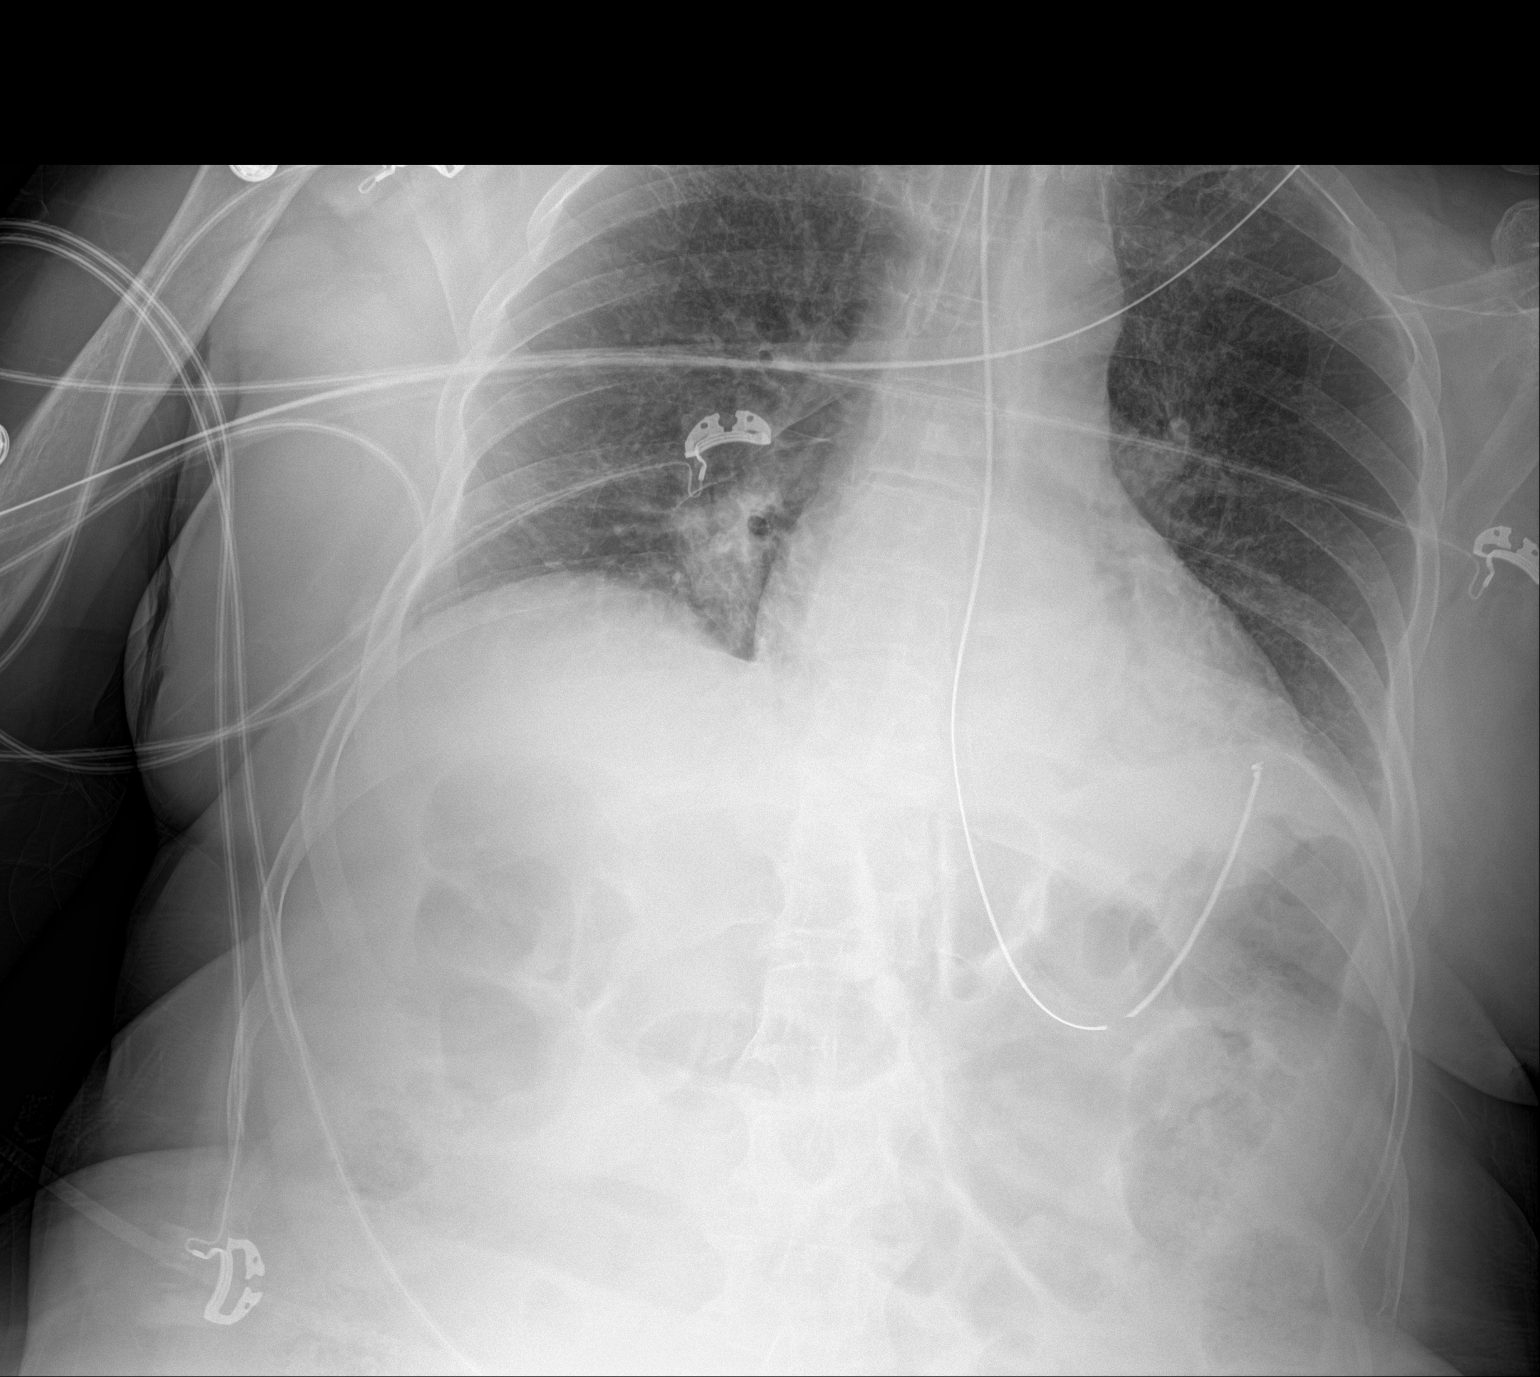

[1 of 1 positions shown; findings below may reference images not displayed]

FINDINGS: Endotracheal tube tip 1 cm above the carina. NG tube tip noted in
the stomach. Heart size normal. Bilateral pulmonary interstitial
prominence again noted. Bilateral pulmonary infiltrates again noted.
Interim improvement from prior exam. Low lung volumes with bibasilar
atelectasis.
IMPRESSION: 1. Endotracheal tube tip 1 cm above the carina. NG tube tip noted
the stomach.

2. Bilateral pulmonary interstitial prominence again noted.
Bilateral pulmonary alveolar infiltrates again noted. Interim
improvement from prior exam. Low lung volumes with bibasilar
atelectasis.

## 2019-12-22 MED ORDER — PROSOURCE TF PO LIQD
45.0000 mL | Freq: Every day | ORAL | Status: DC
  Administered 2019-12-22 – 2019-12-28 (×6): 45 mL
  Filled 2019-12-22 (×6): qty 45

## 2019-12-22 MED ORDER — LEVOTHYROXINE SODIUM 88 MCG PO TABS
88.0000 ug | ORAL_TABLET | Freq: Every day | ORAL | Status: DC
  Administered 2019-12-23 – 2019-12-27 (×5): 88 ug
  Filled 2019-12-22 (×5): qty 1

## 2019-12-22 MED ORDER — LACTATED RINGERS IV BOLUS
500.0000 mL | Freq: Once | INTRAVENOUS | Status: AC
  Administered 2019-12-22: 500 mL via INTRAVENOUS

## 2019-12-22 MED ORDER — POLYETHYLENE GLYCOL 3350 17 G PO PACK: 17.0000 g | PACK | Freq: Every day | ORAL | Status: DC | PRN

## 2019-12-22 MED ORDER — IPRATROPIUM-ALBUTEROL 0.5-2.5 (3) MG/3ML IN SOLN
3.0000 mL | RESPIRATORY_TRACT | Status: DC | PRN
  Administered 2019-12-23: 3 mL via RESPIRATORY_TRACT
  Filled 2019-12-22: qty 3

## 2019-12-22 MED ORDER — ATORVASTATIN CALCIUM 40 MG PO TABS
40.0000 mg | ORAL_TABLET | Freq: Every evening | ORAL | Status: DC
  Administered 2019-12-22 – 2019-12-26 (×5): 40 mg
  Filled 2019-12-22 (×5): qty 1

## 2019-12-22 MED ORDER — DOCUSATE SODIUM 50 MG/5ML PO LIQD
100.0000 mg | Freq: Two times a day (BID) | ORAL | Status: DC
  Administered 2019-12-22 – 2019-12-28 (×10): 100 mg
  Filled 2019-12-22 (×11): qty 10

## 2019-12-22 MED ORDER — VITAL AF 1.2 CAL PO LIQD
1000.0000 mL | ORAL | Status: DC
  Administered 2019-12-22 – 2019-12-26 (×4): 1000 mL

## 2019-12-22 MED ORDER — LEVOTHYROXINE SODIUM 88 MCG PO TABS: 88.0000 ug | ORAL_TABLET | Freq: Every day | ORAL | Status: DC

## 2019-12-22 MED ORDER — ORAL CARE MOUTH RINSE: 15.0000 mL | OROMUCOSAL | Status: DC

## 2019-12-22 MED ORDER — METHYLPREDNISOLONE SODIUM SUCC 125 MG IJ SOLR
40.0000 mg | Freq: Two times a day (BID) | INTRAMUSCULAR | Status: DC
  Administered 2019-12-23 – 2019-12-26 (×7): 40 mg via INTRAVENOUS
  Filled 2019-12-22 (×7): qty 2

## 2019-12-22 MED ORDER — FAMOTIDINE 40 MG/5ML PO SUSR
20.0000 mg | Freq: Two times a day (BID) | ORAL | Status: DC
  Administered 2019-12-22 – 2019-12-27 (×11): 20 mg
  Filled 2019-12-22 (×11): qty 2.5

## 2019-12-22 MED ORDER — ATORVASTATIN CALCIUM 40 MG PO TABS: 40.0000 mg | ORAL_TABLET | Freq: Every evening | ORAL | Status: DC

## 2019-12-22 NOTE — Progress Notes (Signed)
ANTICOAGULATION CONSULT NOTE   Pharmacy Consult for Heparin  Indication: atrial fibrillation  No Known Allergies  Patient Measurements: Height: 4\' 11"  (149.9 cm) Weight: 67.9 kg (149 lb 11.1 oz) IBW/kg (Calculated) : 43.2  Vital Signs: Temp: 99.3 F (37.4 C) (07/30 0800) Temp Source: Bladder (07/30 0800) BP: 104/69 (07/30 1140) Pulse Rate: 67 (07/30 1140)  Labs: Recent Labs    12/21/19 1708 12/21/19 1708 12/21/19 1711 12/21/19 1711 12/21/19 1803 12/21/19 1849 12/21/19 1945 12/22/19 0133 12/22/19 1210  HGB 12.8   < > 13.3   < >  --   --  12.2 11.1*  --   HCT 40.8   < > 39.0  --   --   --  36.0 34.9*  --   PLT 226  --   --   --   --   --   --  197  --   APTT  --   --   --   --  29  --   --   --   --   LABPROT  --   --   --   --  13.0  --   --   --   --   INR  --   --   --   --  1.0  --   --   --   --   HEPARINUNFRC  --   --   --   --   --   --   --  1.03* 0.90*  CREATININE 1.22*  --   --   --   --   --   --   --   --   TROPONINIHS 15  --   --   --   --  42*  --   --   --    < > = values in this interval not displayed.    Estimated Creatinine Clearance: 35.5 mL/min (A) (by C-G formula based on SCr of 1.22 mg/dL (H)).   Medical History: Past Medical History:  Diagnosis Date  . Atrial fibrillation ([place])   . 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 Hypertension   . Stroke ([place])   . Thyroid disease     Assessment: Heparin for afib, on Lovenox at home, Hgb 11.1, Scr 1.22  Heparin level supratherapeutic.  Goal of Therapy:  Heparin level 0.3-0.7 units/ml Monitor platelets by anticoagulation protocol: Yes   Plan:  Decrease heparin to 550 units/hr 2200 heparin level  Alanda Slim, PharmD, [facility] Clinical Pharmacist Please see AMION for all Pharmacists' Contact Phone Numbers 12/22/2019, 2:00 PM

## 2019-12-22 NOTE — Progress Notes (Signed)
Initial Nutrition Assessment  DOCUMENTATION CODES:   Not applicable  INTERVENTION:   Initiate tube feeding via OG tube: Vital AF 1.2 at 45 ml/h (1080 ml per day) Prosource TF 45 ml once daily  Provides 1336 kcal (1442 kcal total with propofol), 92 gm protein, 876 ml free water daily  NUTRITION DIAGNOSIS:   Inadequate oral intake related to inability to eat as evidenced by NPO status.  GOAL:   Patient will meet greater than or equal to 90% of their needs  MONITOR:   Vent status, TF tolerance, Labs  REASON FOR ASSESSMENT:   Ventilator, Consult Enteral/tube feeding initiation and management  ASSESSMENT:   74 yo female admitted with sepsis, acute respiratory failure d/t multifocal PNA. PMH includes subglottic stenosis, excision of benign mucosal uvular lesion 12/11/19, CVA, COPD, A fib, HTN, hypothyroidism.   Discussed patient in ICU rounds and with RN today. Patient will likely need trach soon. ENT to determine timing of placement. OG tube in place. Per discussion with CCM, okay to begin TF today, RD to order.  Patient is currently intubated on ventilator support MV: 7.8 L/min Temp (24hrs), Avg:100.2 F (37.9 C), Min:95.6 F (35.3 C), Max:100.8 F (38.2 C)  Propofol: 4 ml/hr providing 106 kcal from lipid  Labs reviewed. Sodium 131 CBG: 189-190-190  Medications reviewed and include colace, novolog, solumedrol, miralax, propofol.   Weight encounters reviewed. Usual weight 69.2 kg 08/2019, currently 67.9 kg. No significant weight changes over the past 3 months.  NUTRITION - FOCUSED PHYSICAL EXAM:    Most Recent Value  Orbital Region No depletion  Upper Arm Region Unable to assess  Thoracic and Lumbar Region Unable to assess  Buccal Region Unable to assess  Temple Region No depletion  Clavicle Bone Region No depletion  Clavicle and Acromion Bone Region No depletion  Scapular Bone Region No depletion  Dorsal Hand Unable to assess  Patellar Region Mild  depletion  Anterior Thigh Region Unable to assess  Posterior Calf Region Mild depletion  Edema (RD Assessment) Mild  Hair Reviewed  Eyes Unable to assess  Mouth Unable to assess  Skin Reviewed  Nails Reviewed       Diet Order:   Diet Order            Diet NPO time specified  Diet effective now                 EDUCATION NEEDS:   No education needs have been identified at this time  Skin:  Skin Assessment: Reviewed RN Assessment  Last BM:  PTA  Height:   Ht Readings from Last 1 Encounters:  12/21/19 4\' 11"  (1.499 m)    Weight:   Wt Readings from Last 1 Encounters:  12/22/19 67.9 kg    Ideal Body Weight:  44.7 kg  BMI:  Body mass index is 30.23 kg/m.  Estimated Nutritional Needs:   Kcal:  1470  Protein:  90-100 gm  Fluid:  >/= 1.5 L    Lucas Mallow, RD, LDN, CNSC Please refer to Amion for contact information.

## 2019-12-22 NOTE — Progress Notes (Signed)
ANTICOAGULATION CONSULT NOTE   Pharmacy Consult for Heparin  Indication: atrial fibrillation  No Known Allergies  Patient Measurements: Height: 4\' 11"  (149.9 cm) Weight: 67.9 kg (149 lb 11.1 oz) IBW/kg (Calculated) : 43.2  Vital Signs: Temp: 99.3 F (37.4 C) (07/30 1500) Temp Source: Bladder (07/30 1500) BP: 88/59 (07/30 2330) Pulse Rate: 64 (07/30 2330)  Labs: Recent Labs    12/21/19 1708 12/21/19 1708 12/21/19 1711 12/21/19 1711 12/21/19 1803 12/21/19 1849 12/21/19 1945 12/22/19 0133 12/22/19 1210 12/22/19 2248  HGB 12.8   < > 13.3   < >  --   --  12.2 11.1*  --   --   HCT 40.8   < > 39.0  --   --   --  36.0 34.9*  --   --   PLT 226  --   --   --   --   --   --  197  --   --   APTT  --   --   --   --  29  --   --   --   --   --   LABPROT  --   --   --   --  13.0  --   --   --   --   --   INR  --   --   --   --  1.0  --   --   --   --   --   HEPARINUNFRC  --   --   --   --   --   --   --  1.03* 0.90* 0.61  CREATININE 1.22*  --   --   --   --   --   --   --   --   --   TROPONINIHS 15  --   --   --   --  42*  --   --   --   --    < > = values in this interval not displayed.    Estimated Creatinine Clearance: 35.5 mL/min (A) (by C-G formula based on SCr of 1.22 mg/dL (H)).  Assessment: 74 y.o. female with Afib for heparin  Goal of Therapy:  Heparin level 0.3-0.7 units/ml Monitor platelets by anticoagulation protocol: Yes   Plan:  Continue Heparin at current rate   Phillis Knack, PharmD, BCPS  12/22/2019, 11:46 PM

## 2019-12-22 NOTE — Progress Notes (Signed)
eLink Physician-Brief Progress Note Patient Name: Charmain Diosdado DOB: 02/25/1946 MRN: [id number]   Date of Service  12/22/2019  HPI/Events of Note  Patient with evolving ileus.Blood pressure is soft.  eICU Interventions  NG tube to LIS.  LR 500 ml iv bolus x 1.        Annalee Meyerhoff [facility] [place] 12/22/2019, 1:00 AM

## 2019-12-22 NOTE — Progress Notes (Signed)
NAME:  Jamie Mitchell, MRN:  [id number], DOB:  11/25/1945, LOS: 1 ADMISSION DATE:  12/21/2019, CONSULTATION DATE:  12/21/2019 REFERRING MD:  Dr. Ralene Bathe, CHIEF COMPLAINT:  Acute respiratory failure   Brief History   74yo female with known history of recurrent stridor and subglottic edema/stinosis presents with acute hypoxic respiratory failure resulting in intubation by EMS. PCCM consulted for further management and admission.   History of present illness   Jamie Mitchell is a 74yo female with PMX significant for thyroid disease, stroke, hypertension, chronic mastoiditis, and atrial fibrillation who presented to ED with acute hypoxic respiratory failure. Per EMS they were intimally called for complaints of shortness of breath, on arrival patient was found with agonal breathing and significant hypoxia with SPO2 of 60% on RA. Patient intubated and started on solumedrol.  CXR showed bilateral opacities, patient started on azithromycin and ceftriaxone with blood, sputum, and urine cultures collected.   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 During this admission she was seen by ENT and PCCM and treated with BIPAP and steroids.   Past Medical History  Thyroid disease Stroke Hypertension Chronic mastoiditis A-fib  Significant Hospital Events   Admitted with acute hypoxic respiratory failure, intubated per EMS  Consults:  ENT   Procedures:  ETT 7/29 >  Significant Diagnostic Tests:  CT head 7/29 > No CT evidence of acute intracranial abnormality. Redemonstrated large chronic left frontal lobe infarct. 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 Mild ethmoid and left maxillary sinus mucosal thickening. Large left middle ear/mastoid effusion. PFT 12/19/2019 ratio 69% FEV1 80% FVC 139% DLCO 118%  CXR 07/30 > Bilateral pulmonary interstitial  prominence again noted. Bilateral pulmonary alveolar infiltrates again noted. Interim improvement from prior exam. Low lung volumes with bibasilar atelectasis.  Micro Data:  Blood 7/29 > Sputum 7/29 > Urine 7/29 >  Antimicrobials:  Azithro 7/29 Rocephin 7/29 Cefepime 7/29 >stopped  Vanc 7/29>stopped    Interim history/subjective:  Intubated in ED   Objective   Blood pressure 104/69, pulse 67, temperature 99.3 F (37.4 C), temperature source Bladder, resp. rate (!) 28, height 4\' 11"  (1.499 m), weight 67.9 kg, SpO2 100 %.    Vent Mode: PRVC FiO2 (%):  [40 %-100 %] 40 % Set Rate:  [20 bmp] 20 bmp Vt Set:  [340 mL] 340 mL PEEP:  [5 cmH20] 5 cmH20 Pressure Support:  [10 cmH20] 10 cmH20 Plateau Pressure:  [11 cmH20-15 cmH20] 12 cmH20   Intake/Output Summary (Last 24 hours) at 12/22/2019 1301 Last data filed at 12/22/2019 0900 Gross per 24 hour  Intake 1951.87 ml  Output 1525 ml  Net 426.87 ml   Filed Weights   12/21/19 1800 12/21/19 2300 12/22/19 0500  Weight: 66 kg 67.9 kg 67.9 kg    Examination: General: Elderly female on vent HENT: [place]/AT, PERRL no JVD. ETT. Lungs: Clear bilateral Cardiovascular: RRR Abdomen: Soft, non-tender, non-distended.  Extremities: No acute deformity, palpable dp pulses  Neuro: awake, alert, nods appropriately in response to questions   Resolved Hospital Problem list     Assessment & Plan:  Acute respiratory failure with hypoxia, hypercarbia, requiring mechanical ventilation: Found by EMS with SpO2 60s. CXR with diffuse opacities. Pink/red sputum, pulm edema vs traumatic intubation. Reasonable she could have negative pressure pulm edema in the setting of tracheal stenosis. Family did hear a strong stridor. She also has a history of CVA with known aspiration and has  recently been hospitalized.  - Full vent support - Empiric antibiotics, HCAP - f/u CXR - VAP bundle  Hx Subglottic stenosis and glottic edema, PFTs as above.  -consult ENT  7/30 AM  -Solumedrol -family agreeable to tracheostomy, plan for this weekend or early next week -plan for immunology outpatient follow up   Prior L MCA embolic CVA: residual dysphagia, known aspiration -supportive care -SLP eval when extubated   Afib: previously on amio, eliquis-- since discontinued with concern that this may have contributed to prior angioedema. 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  -SSI resistant  GERD -pepcid   Best practice:  Diet: NPO  Pain/Anxiety/Delirium protocol (if indicated): Prop, PRN fent  VAP protocol (if indicated): yes DVT prophylaxis: SCDs GI prophylaxis: pepcid  Glucose control: SSI  Mobility: BR Code Status: Full  Family Communication: Daughter in law updated using interpreter box.  Disposition: ICU   Labs   CBC: Recent Labs  Lab 12/21/19 1708 12/21/19 1711 12/21/19 1945 12/22/19 0133  WBC 11.8*  --   --  10.6*  HGB 12.8 13.3 12.2 11.1*  HCT 40.8 39.0 36.0 34.9*  MCV 92.1  --   --  90.2  PLT 226  --   --  425    Basic Metabolic Panel: Recent Labs  Lab 12/21/19 1708 12/21/19 1711 12/21/19 1945  NA 131* 131* 131*  K 4.1 3.8 3.8  CL 97*  --   --   CO2 22  --   --   GLUCOSE 306*  --   --   BUN 11  --   --   CREATININE 1.22*  --   --   CALCIUM 8.1*  --   --    GFR: Estimated Creatinine Clearance: 35.5 mL/min (A) (by C-G formula based on SCr of 1.22 mg/dL (H)). Recent Labs  Lab 12/21/19 1708 12/21/19 1821 12/21/19 1849 12/21/19 2003 12/22/19 0133  PROCALCITON  --  <0.10  --   --  1.19  WBC 11.8*  --   --   --  10.6*  LATICACIDVEN  --   --  2.1* 2.5*  --     Liver Function Tests: Recent Labs  Lab 12/21/19 1708  AST 34  ALT 49*  ALKPHOS 49  BILITOT 0.7  PROT 6.0*  ALBUMIN 2.9*   No results for input(s): LIPASE, AMYLASE in the last 168 hours. No results for input(s): AMMONIA in the last 168 hours.  ABG    Component Value Date/Time   PHART  7.393 12/21/2019 1945   PCO2ART 51.1 (H) 12/21/2019 1945   PO2ART 215 (H) 12/21/2019 1945   HCO3 30.8 (H) 12/21/2019 1945   TCO2 32 12/21/2019 1945   ACIDBASEDEF 1.0 12/21/2019 1711   O2SAT 100.0 12/21/2019 1945     Coagulation Profile: Recent Labs  Lab 12/21/19 1803  INR 1.0    Cardiac Enzymes: No results for input(s): CKTOTAL, CKMB, CKMBINDEX, TROPONINI in the last 168 hours.  HbA1C: HbA1c, POC (controlled diabetic range)  Date/Time Value Ref Range Status  11/28/2019 11:06 AM 6.6 0.0 - 7.0 % Final   Hgb A1c MFr Bld  Date/Time Value Ref Range Status  12/02/2019 05:54 PM 7.1 (H) 4.8 - 5.6 % Final    Comment:    (NOTE) Pre diabetes:          5.7%-6.4%  Diabetes:              >6.4%  Glycemic control  for   <7.0% adults with diabetes   07/20/2019 03:56 AM 6.1 (H) 4.8 - 5.6 % Final    Comment:    (NOTE) Pre diabetes:          5.7%-6.4% Diabetes:              >6.4% Glycemic control for   <7.0% adults with diabetes   07/20/2019 03:56 AM 6.0 (H) 4.8 - 5.6 % Final    Comment:    (NOTE) Pre diabetes:          5.7%-6.4% Diabetes:              >6.4% Glycemic control for   <7.0% adults with diabetes     CBG: Recent Labs  Lab 12/21/19 2302 12/22/19 0046 12/22/19 0317 12/22/19 0708 12/22/19 1104  GLUCAP 75 78 189* 190* 190*    Review of Systems:   Patient is encephalopathic and/or intubated. Therefore history has been obtained from chart review.    Shelah Lewandowsky [place], [place]

## 2019-12-22 NOTE — Progress Notes (Signed)
eLink Physician-Brief Progress Note Patient Name: Jamie Mitchell DOB: 1946/03/31 MRN: [id number]   Date of Service  12/22/2019  HPI/Events of Note  Patient with septic shock s/p aggressive volume resuscitation, howerer her lactate  Remains 2.5 and blood pressure remains soft.  eICU Interventions  LR 500 ml iv bolus x 1 now ordered.        Kerry Kass Galo Sayed 12/22/2019, 5:08 AM

## 2019-12-22 NOTE — Progress Notes (Signed)
ANTICOAGULATION CONSULT NOTE   Pharmacy Consult for Heparin  Indication: atrial fibrillation  No Known Allergies  Patient Measurements: Height: 4\' 11"  (149.9 cm) Weight: 67.9 kg (149 lb 11.1 oz) IBW/kg (Calculated) : 43.2  Vital Signs: Temp: 100.8 F (38.2 C) (07/30 0100) Temp Source: Bladder (07/30 0100) BP: 90/59 (07/30 0300) Pulse Rate: 67 (07/30 0300)  Labs: Recent Labs    12/21/19 1708 12/21/19 1708 12/21/19 1711 12/21/19 1711 12/21/19 1803 12/21/19 1849 12/21/19 1945 12/22/19 0133  HGB 12.8   < > 13.3   < >  --   --  12.2 11.1*  HCT 40.8   < > 39.0  --   --   --  36.0 34.9*  PLT 226  --   --   --   --   --   --  197  APTT  --   --   --   --  29  --   --   --   LABPROT  --   --   --   --  13.0  --   --   --   INR  --   --   --   --  1.0  --   --   --   HEPARINUNFRC  --   --   --   --   --   --   --  1.03*  CREATININE 1.22*  --   --   --   --   --   --   --   TROPONINIHS 15  --   --   --   --  42*  --   --    < > = values in this interval not displayed.    Estimated Creatinine Clearance: 35.5 mL/min (A) (by C-G formula based on SCr of 1.22 mg/dL (H)).   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

## 2019-12-23 ENCOUNTER — Inpatient Hospital Stay (HOSPITAL_COMMUNITY): Payer: Medicare Other

## 2019-12-23 LAB — BASIC METABOLIC PANEL
Anion gap: 8 (ref 5–15)
BUN: 16 mg/dL (ref 8–23)
CO2: 26 mmol/L (ref 22–32)
Calcium: 8.3 mg/dL — ABNORMAL LOW (ref 8.9–10.3)
Chloride: 101 mmol/L (ref 98–111)
Creatinine, Ser: 0.87 mg/dL (ref 0.44–1.00)
GFR calc Af Amer: >60 mL/min (ref >60)
GFR calc non Af Amer: >60 mL/min (ref >60)
Glucose, Bld: 204 mg/dL — ABNORMAL HIGH (ref 70–99)
Potassium: 4.1 mmol/L (ref 3.5–5.1)
Sodium: 135 mmol/L (ref 135–145)

## 2019-12-23 LAB — GLUCOSE, CAPILLARY
Glucose-Capillary: 174 mg/dL — ABNORMAL HIGH (ref 70–99)
Glucose-Capillary: 186 mg/dL — ABNORMAL HIGH (ref 70–99)
Glucose-Capillary: 190 mg/dL — ABNORMAL HIGH (ref 70–99)
Glucose-Capillary: 196 mg/dL — ABNORMAL HIGH (ref 70–99)
Glucose-Capillary: 196 mg/dL — ABNORMAL HIGH (ref 70–99)
Glucose-Capillary: 219 mg/dL — ABNORMAL HIGH (ref 70–99)

## 2019-12-23 LAB — CBC
HCT: 32.2 % — ABNORMAL LOW (ref 36.0–46.0)
Hemoglobin: 10.9 g/dL — ABNORMAL LOW (ref 12.0–15.0)
MCH: 30 pg (ref 26.0–34.0)
MCHC: 33.9 g/dL (ref 30.0–36.0)
MCV: 88.7 fL (ref 80.0–100.0)
Platelets: 211 10*3/uL (ref 150–400)
RBC: 3.63 MIL/uL — ABNORMAL LOW (ref 3.87–5.11)
RDW: 16.9 % — ABNORMAL HIGH (ref 11.5–15.5)
WBC: 15.6 10*3/uL — ABNORMAL HIGH (ref 4.0–10.5)
nRBC: 0 % (ref 0.0–0.2)

## 2019-12-23 LAB — TRIGLYCERIDES: Triglycerides: 61 mg/dL (ref <150)

## 2019-12-23 LAB — PROCALCITONIN: Procalcitonin: 0.98 ng/mL

## 2019-12-23 LAB — MAGNESIUM
Magnesium: 1.9 mg/dL (ref 1.7–2.4)
Magnesium: 2.1 mg/dL (ref 1.7–2.4)

## 2019-12-23 LAB — PHOSPHORUS
Phosphorus: 4.2 mg/dL (ref 2.5–4.6)
Phosphorus: 3.4 mg/dL (ref 2.5–4.6)

## 2019-12-23 LAB — HEPARIN LEVEL (UNFRACTIONATED): Heparin Unfractionated: 0.47 IU/mL (ref 0.30–0.70)

## 2019-12-23 IMAGING — DX DG CHEST 1V PORT
1 series · 1 of 1 positions shown · non-contrast
Comparison: [DATE]

CLINICAL DATA: Respiratory failure.

EXAM:
PORTABLE CHEST 1 VIEW

[chest]
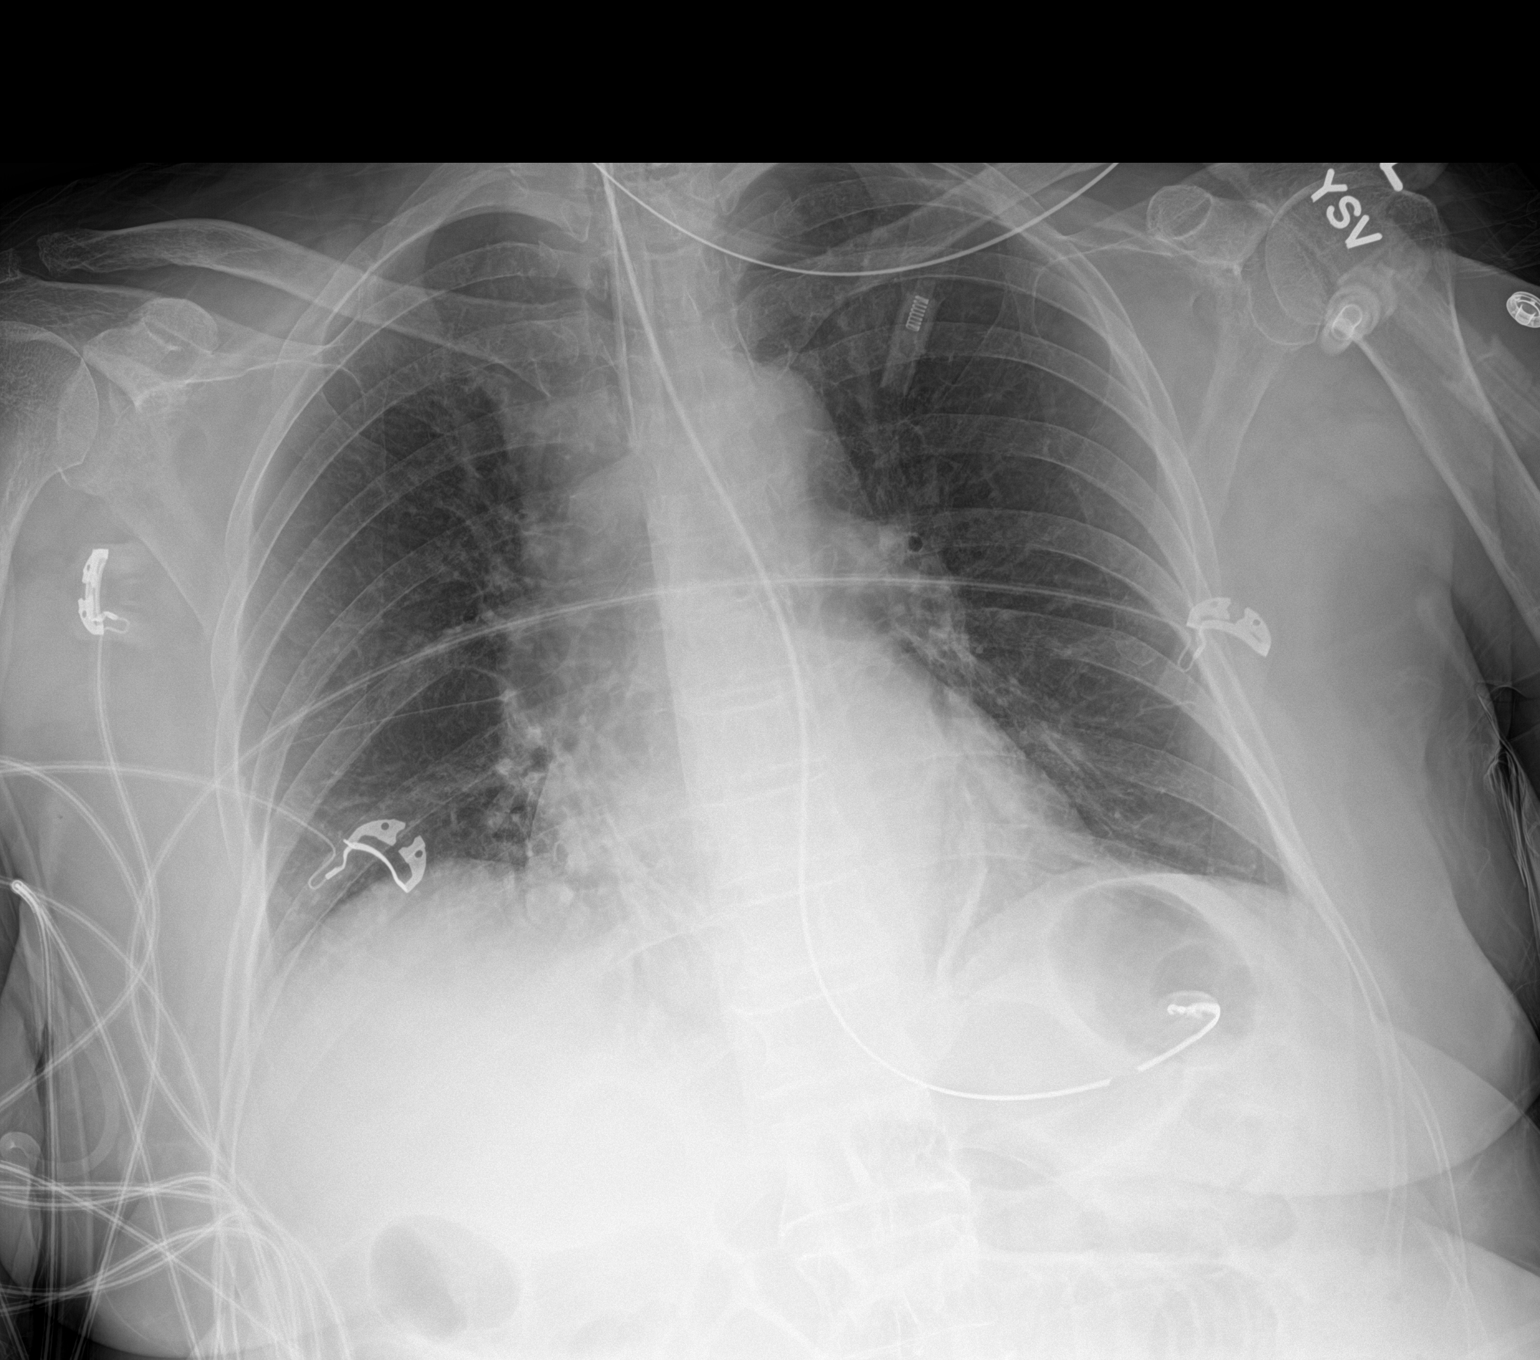

[1 of 1 positions shown; findings below may reference images not displayed]

FINDINGS: [EG] hours. Endotracheal tube tip is 3.4 cm above the base of the
carina. NG tube tip is in the stomach. Interstitial markings are
diffusely coarsened with chronic features. Streaky opacity at the
bases likely atelectasis or scarring. The visualized bony structures
of the thorax show now acute abnormality. Telemetry leads overlie
the chest.
IMPRESSION: Streaky basilar opacity bilaterally likely atelectasis or scarring.
Alveolar opacity described on the previous study has resolved in the
interval.

## 2019-12-23 MED ORDER — LACTATED RINGERS IV BOLUS
1000.0000 mL | Freq: Once | INTRAVENOUS | Status: AC
  Administered 2019-12-23: 1000 mL via INTRAVENOUS

## 2019-12-23 NOTE — Progress Notes (Signed)
NAME:  Jamie Mitchell, MRN:  [id number], DOB:  1945/12/16, LOS: 2 ADMISSION DATE:  12/21/2019, CONSULTATION DATE:  12/21/2019 REFERRING MD:  Dr. Ralene Bathe, CHIEF COMPLAINT:  Acute respiratory failure   Brief History   74yo female with known history of recurrent stridor and subglottic edema/stinosis presents with acute hypoxic respiratory failure resulting in intubation by EMS. PCCM consulted for further management and admission.   History of present illness   Jamie Mitchell is a 74yo female with PMX significant for thyroid disease, stroke, hypertension, chronic mastoiditis, and atrial fibrillation who presented to ED with acute hypoxic respiratory failure. Per EMS they were intimally called for complaints of shortness of breath, on arrival patient was found with agonal breathing and significant hypoxia with SPO2 of 60% on RA. Patient intubated and started on solumedrol.  CXR showed bilateral opacities, patient started on azithromycin and ceftriaxone with blood, sputum, and urine cultures collected.   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 During this admission she was seen by ENT and PCCM and treated with BIPAP and steroids.   Past Medical History  Thyroid disease Stroke Hypertension Chronic mastoiditis A-fib  Significant Hospital Events   Admitted with acute hypoxic respiratory failure, intubated per EMS  Consults:  ENT   Procedures:  ETT 7/29 >  Significant Diagnostic Tests:  CT head 7/29 > No CT evidence of acute intracranial abnormality. Redemonstrated large chronic left frontal lobe infarct. 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 Mild ethmoid and left maxillary sinus mucosal thickening. Large left middle ear/mastoid effusion. PFT 12/19/2019 ratio 69% FEV1 80% FVC 139% DLCO 118%  CXR 07/30 > Bilateral pulmonary interstitial  prominence again noted. Bilateral pulmonary alveolar infiltrates again noted. Interim improvement from prior exam. Low lung volumes with bibasilar atelectasis.  Micro Data:  Blood 7/29 > Sputum 7/29 > Urine 7/29 >  Antimicrobials:  Azithro 7/29 >> Rocephin 7/29 >>  Cefepime 7/29 >stopped  Vanc 7/29>stopped    Interim history/subjective:  Remains intubated, tolerating PSV currently Propofol 15 I/O+ 1.9 L total Heparin drip infusing   Objective   Blood pressure (!) 102/63, pulse 63, temperature 98.3 F (36.8 C), temperature source Oral, resp. rate 13, height 4\' 11"  (1.499 m), weight 66.1 kg, SpO2 100 %.    Vent Mode: PSV;CPAP FiO2 (%):  [40 %] 40 % Set Rate:  [20 bmp] 20 bmp Vt Set:  [340 mL-430 mL] 340 mL PEEP:  [5 cmH20] 5 cmH20 Pressure Support:  [5 cmH20] 5 cmH20 Plateau Pressure:  [7 cmH20-14 cmH20] 14 cmH20   Intake/Output Summary (Last 24 hours) at 12/23/2019 1136 Last data filed at 12/23/2019 0600 Gross per 24 hour  Intake 2151.42 ml  Output 480 ml  Net 1671.42 ml   Filed Weights   12/21/19 2300 12/22/19 0500 12/23/19 0400  Weight: 67.9 kg 67.9 kg 66.1 kg    Examination: General: Elderly ill-appearing woman, ventilated HENT: ET tube in place, no oral lesions, pupils equal Lungs: Decreased at both bases, otherwise clear Cardiovascular: Regular, distant, no murmur Abdomen: Nondistended, positive bowel sounds Extremities: No edema Neuro: Awake, alert, follows commands, moves all extremities  Resolved Hospital Problem list     Assessment & Plan:  Acute respiratory failure with hypoxia, hypercarbia, requiring mechanical ventilation 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  Hesitant to extubate given her  upper airway compromise, the recurrent nature of this problem.  She will need tracheostomy.  This  has been discussed with family and they are in agreement Empiric antibiotics for possible CAP as ordered Follow chest x-ray VAP prevention order set  Hx Subglottic stenosis and glottic edema, PFTs as above.  Appreciate ENT input and evaluation.  Family agreeable tracheostomy as above Continue Solu-Medrol She will need an immunology evaluation as an outpatient  Prior L MCA embolic CVA: residual dysphagia, known aspiration Supportive care SLP evaluation when appropriate  Afib: previously on amio, eliquis-- since discontinued with concern that this may have contributed to prior angioedema. Now on full dose lovenox BID at home P Continue heparin infusion, transition back to home enoxaparin once procedures completed  DM2 exacerbated by steroids.  Continue sliding-scale insulin as ordered  GERD pepcid    Best practice:  Diet: NPO  Pain/Anxiety/Delirium protocol (if indicated): Prop, PRN fent  VAP protocol (if indicated): yes DVT prophylaxis: SCDs GI prophylaxis: pepcid  Glucose control: SSI  Mobility: BR Code Status: Full  Family Communication: Updated son at bedside 7/31 Disposition: ICU   Labs   CBC: Recent Labs  Lab 12/21/19 1708 12/21/19 1711 12/21/19 1945 12/22/19 0133 12/23/19 0110  WBC 11.8*  --   --  10.6* 15.6*  HGB 12.8 13.3 12.2 11.1* 10.9*  HCT 40.8 39.0 36.0 34.9* 32.2*  MCV 92.1  --   --  90.2 88.7  PLT 226  --   --  197 240    Basic Metabolic Panel: Recent Labs  Lab 12/21/19 1708 12/21/19 1711 12/21/19 1945 12/22/19 1414 12/22/19 1648 12/23/19 0110  NA 131* 131* 131*  --   --  135  K 4.1 3.8 3.8  --   --  4.1  CL 97*  --   --   --   --  101  CO2 22  --   --   --   --  26  GLUCOSE 306*  --   --   --   --  204*  BUN 11  --   --   --   --  16  CREATININE 1.22*  --   --   --   --  0.87  CALCIUM 8.1*  --   --   --   --  8.3*  MG  --   --   --  1.7 1.8 1.9  PHOS  --   --   --  3.6 3.9 4.2   GFR: Estimated Creatinine Clearance: 49.1  mL/min (by C-G formula based on SCr of 0.87 mg/dL). Recent Labs  Lab 12/21/19 1708 12/21/19 1821 12/21/19 1849 12/21/19 2003 12/22/19 0133 12/23/19 0110  PROCALCITON  --  <0.10  --   --  1.19 0.98  WBC 11.8*  --   --   --  10.6* 15.6*  LATICACIDVEN  --   --  2.1* 2.5*  --   --     Liver Function Tests: Recent Labs  Lab 12/21/19 1708  AST 34  ALT 49*  ALKPHOS 49  BILITOT 0.7  PROT 6.0*  ALBUMIN 2.9*   No results for input(s): LIPASE, AMYLASE in the last 168 hours. No results for input(s): AMMONIA in the last 168 hours.  ABG    Component Value Date/Time   PHART 7.393 12/21/2019 1945   PCO2ART 51.1 (H) 12/21/2019 1945   PO2ART 215 (H) 12/21/2019 1945   HCO3 30.8 (H) 12/21/2019 1945   TCO2 32 12/21/2019 1945  ACIDBASEDEF 1.0 12/21/2019 1711   O2SAT 100.0 12/21/2019 1945     Coagulation Profile: Recent Labs  Lab 12/21/19 1803  INR 1.0    Cardiac Enzymes: No results for input(s): CKTOTAL, CKMB, CKMBINDEX, TROPONINI in the last 168 hours.  HbA1C: HbA1c, POC (controlled diabetic range)  Date/Time Value Ref Range Status  11/28/2019 11:06 AM 6.6 0.0 - 7.0 % Final   Hgb A1c MFr Bld  Date/Time Value Ref Range Status  12/02/2019 05:54 PM 7.1 (H) 4.8 - 5.6 % Final    Comment:    (NOTE) Pre diabetes:          5.7%-6.4%  Diabetes:              >6.4%  Glycemic control for   <7.0% adults with diabetes   07/20/2019 03:56 AM 6.1 (H) 4.8 - 5.6 % Final    Comment:    (NOTE) Pre diabetes:          5.7%-6.4% Diabetes:              >6.4% Glycemic control for   <7.0% adults with diabetes   07/20/2019 03:56 AM 6.0 (H) 4.8 - 5.6 % Final    Comment:    (NOTE) Pre diabetes:          5.7%-6.4% Diabetes:              >6.4% Glycemic control for   <7.0% adults with diabetes     CBG: Recent Labs  Lab 12/22/19 1905 12/22/19 2315 12/23/19 0303 12/23/19 0805 12/23/19 1124  GLUCAP 239* 163* 190* 219* 196*    Independent CC time 32 minutes  Baltazar Apo, MD,  PhD 12/23/2019, 11:42 AM [facility] [phone] or if no answer [phone]

## 2019-12-23 NOTE — Progress Notes (Signed)
ANTICOAGULATION CONSULT NOTE   Pharmacy Consult for Heparin  Indication: atrial fibrillation  No Known Allergies  Patient Measurements: Height: 4\' 11"  (149.9 cm) Weight: 66.1 kg (145 lb 11.6 oz) IBW/kg (Calculated) : 43.2  Vital Signs: Temp: 98.1 F (36.7 C) (07/31 0357) Temp Source: Oral (07/31 0357) BP: 102/63 (07/31 0706) Pulse Rate: 69 (07/31 0800)  Labs: Recent Labs    12/21/19 1708 12/21/19 1711 12/21/19 1803 12/21/19 1849 12/21/19 1945 12/21/19 1945 12/22/19 0133 12/22/19 0133 12/22/19 1210 12/22/19 2248 12/23/19 0110  HGB 12.8   < >  --   --  12.2   < > 11.1*  --   --   --  10.9*  HCT 40.8   < >  --   --  36.0  --  34.9*  --   --   --  32.2*  PLT 226  --   --   --   --   --  197  --   --   --  211  APTT  --   --  29  --   --   --   --   --   --   --   --   LABPROT  --   --  13.0  --   --   --   --   --   --   --   --   INR  --   --  1.0  --   --   --   --   --   --   --   --   HEPARINUNFRC  --   --   --   --   --   --  1.03*   < > 0.90* 0.61 0.47  CREATININE 1.22*  --   --   --   --   --   --   --   --   --  0.87  TROPONINIHS 15  --   --  42*  --   --   --   --   --   --   --    < > = values in this interval not displayed.    Estimated Creatinine Clearance: 49.1 mL/min (by C-G formula based on SCr of 0.87 mg/dL).  Assessment: 74 y.o. female with Afib for heparin  AM level is therapeutic  Goal of Therapy:  Heparin level 0.3-0.7 units/ml Monitor platelets by anticoagulation protocol: Yes   Plan:  Continue Heparin at current rate  Check qam heparin levels and CBC  Alanda Slim, PharmD, [facility] Clinical Pharmacist Please see AMION for all Pharmacists' Contact Phone Numbers 12/23/2019, 8:33 AM

## 2019-12-23 NOTE — Progress Notes (Signed)
eLink Physician-Brief Progress Note Patient Name: Jamie Mitchell DOB: November 25, 1945 MRN: [id number]   Date of Service  12/23/2019  HPI/Events of Note  Hypotension - BP = 79/53 with MAP = 62. LVEF = 60-65%.  eICU Interventions  Plan: 1. Bolus with LR 1 liter IV over 1 hour now.     Intervention Category Major Interventions: Hypotension - evaluation and management  Dayanne Yiu Eugene 12/23/2019, 3:52 AM

## 2019-12-23 NOTE — Progress Notes (Signed)
Pt has not voided since foley has been removed earlier on day shift. Bladder scan showed 152mls of urine, pt does not feel like she needs to void. Also blood pressures are on the softer side with MAPs around 65. Spoke to [facility] about above concerns.

## 2019-12-24 LAB — CBC
HCT: 33.1 % — ABNORMAL LOW (ref 36.0–46.0)
Hemoglobin: 10.8 g/dL — ABNORMAL LOW (ref 12.0–15.0)
MCH: 29 pg (ref 26.0–34.0)
MCHC: 32.6 g/dL (ref 30.0–36.0)
MCV: 88.7 fL (ref 80.0–100.0)
Platelets: 211 10*3/uL (ref 150–400)
RBC: 3.73 MIL/uL — ABNORMAL LOW (ref 3.87–5.11)
RDW: 17 % — ABNORMAL HIGH (ref 11.5–15.5)
WBC: 16.2 10*3/uL — ABNORMAL HIGH (ref 4.0–10.5)
nRBC: 0 % (ref 0.0–0.2)

## 2019-12-24 LAB — BASIC METABOLIC PANEL
Anion gap: 8 (ref 5–15)
BUN: 21 mg/dL (ref 8–23)
CO2: 25 mmol/L (ref 22–32)
Calcium: 8.2 mg/dL — ABNORMAL LOW (ref 8.9–10.3)
Chloride: 103 mmol/L (ref 98–111)
Creatinine, Ser: 0.78 mg/dL (ref 0.44–1.00)
GFR calc Af Amer: >60 mL/min (ref >60)
GFR calc non Af Amer: >60 mL/min (ref >60)
Glucose, Bld: 263 mg/dL — ABNORMAL HIGH (ref 70–99)
Potassium: 3.7 mmol/L (ref 3.5–5.1)
Sodium: 136 mmol/L (ref 135–145)

## 2019-12-24 LAB — GLUCOSE, CAPILLARY
Glucose-Capillary: 239 mg/dL — ABNORMAL HIGH (ref 70–99)
Glucose-Capillary: 251 mg/dL — ABNORMAL HIGH (ref 70–99)
Glucose-Capillary: 161 mg/dL — ABNORMAL HIGH (ref 70–99)
Glucose-Capillary: 185 mg/dL — ABNORMAL HIGH (ref 70–99)
Glucose-Capillary: 234 mg/dL — ABNORMAL HIGH (ref 70–99)
Glucose-Capillary: 231 mg/dL — ABNORMAL HIGH (ref 70–99)

## 2019-12-24 LAB — HEPARIN LEVEL (UNFRACTIONATED): Heparin Unfractionated: 0.47 IU/mL (ref 0.30–0.70)

## 2019-12-24 LAB — TRIGLYCERIDES: Triglycerides: 69 mg/dL (ref <150)

## 2019-12-24 NOTE — Progress Notes (Signed)
NAME:  Jamie Mitchell, MRN:  [id number], DOB:  January 30, 1946, LOS: 3 ADMISSION DATE:  12/21/2019, CONSULTATION DATE:  12/21/2019 REFERRING MD:  Dr. Ralene Bathe, CHIEF COMPLAINT:  Acute respiratory failure   Brief History   74yo female with known history of recurrent stridor and subglottic edema/stinosis presents with acute hypoxic respiratory failure resulting in intubation by EMS. PCCM consulted for further management and admission.   History of present illness   Jamie Mitchell is a 74yo female with PMX significant for thyroid disease, stroke, hypertension, chronic mastoiditis, and atrial fibrillation who presented to ED with acute hypoxic respiratory failure. Per EMS they were intimally called for complaints of shortness of breath, on arrival patient was found with agonal breathing and significant hypoxia with SPO2 of 60% on RA. Patient intubated and started on solumedrol.  CXR showed bilateral opacities, patient started on azithromycin and ceftriaxone with blood, sputum, and urine cultures collected.   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 During this admission she was seen by ENT and PCCM and treated with BIPAP and steroids.   Past Medical History  Thyroid disease Stroke Hypertension Chronic mastoiditis A-fib  Significant Hospital Events   Admitted with acute hypoxic respiratory failure, intubated per EMS  Consults:  ENT   Procedures:  ETT 7/29 >  Significant Diagnostic Tests:  CT head 7/29 > No CT evidence of acute intracranial abnormality. Redemonstrated large chronic left frontal lobe infarct. 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 Mild ethmoid and left maxillary sinus mucosal thickening. Large left middle ear/mastoid effusion. PFT 12/19/2019 ratio 69% FEV1 80% FVC 139% DLCO 118%  CXR 07/30 > Bilateral pulmonary interstitial  prominence again noted. Bilateral pulmonary alveolar infiltrates again noted. Interim improvement from prior exam. Low lung volumes with bibasilar atelectasis.  Micro Data:  Blood 7/29 > Sputum 7/29 > Urine 7/29 >  Antimicrobials:  Azithro 7/29 >> Rocephin 7/29 >>  Cefepime 7/29 >stopped  Vanc 7/29>stopped    Interim history/subjective:   Tolerating pressure support WBC 16.2 I do not see any notes from ENT regarding tracheostomy   Objective   Blood pressure 127/76, pulse 61, temperature (!) 97.2 F (36.2 C), temperature source Axillary, resp. rate 15, height 4\' 11"  (1.499 m), weight 69.5 kg, SpO2 100 %.    Vent Mode: CPAP;PSV FiO2 (%):  [40 %] 40 % Set Rate:  [20 bmp] 20 bmp Vt Set:  [340 mL] 340 mL PEEP:  [5 cmH20] 5 cmH20 Pressure Support:  [5 cmH20] 5 cmH20 Plateau Pressure:  [11 cmH20-14 cmH20] 14 cmH20   Intake/Output Summary (Last 24 hours) at 12/24/2019 1147 Last data filed at 12/24/2019 6283 Gross per 24 hour  Intake 1525.77 ml  Output 900 ml  Net 625.77 ml   Filed Weights   12/22/19 0500 12/23/19 0400 12/24/19 0434  Weight: 67.9 kg 66.1 kg 69.5 kg    Examination: General: Elderly woman, ventilated HENT: Pupils equal, no oral lesions, ET tube in place Lungs: Decreased anterolaterally, otherwise clear Cardiovascular: Regular, distant, no murmur Abdomen: Nondistended, positive bowel sounds Extremities: No edema Neuro: Alert, awake, moves all extremities  Resolved Hospital Problem list     Assessment & Plan:  Acute respiratory failure with hypoxia, hypercarbia, requiring mechanical ventilation 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  Given upper airway findings, recurrent nature of her  respiratory failure.  Hesitant to perform a trial of extubation.  I believe she needs tracheostomy.  Family  agrees.  I documentation indicates that ENT was consulted but I do not see any note from them.  We will need to call them back on 8/2 Empiric antibiotics for possible CAP as ordered Follow intermittent chest x-ray VAP prevention orders  Hx Subglottic stenosis and glottic edema, PFTs as above.  Family agreeable to tracheostomy.  Await ENT input.  Question tracheal stenosis that may require either debridement or dilation Continue Solu-Medrol Will need an immunology evaluation as an outpatient   Prior L MCA embolic CVA: residual dysphagia, known aspiration Supportive care SLP evaluation when appropriate, extubated  Afib: previously on amio, eliquis-- since discontinued with concern that this may have contributed to prior angioedema. Now on full dose lovenox BID at home P Continue heparin infusion.  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  Sliding-scale insulin as ordered  GERD Pepcid   Best practice:  Diet: NPO  Pain/Anxiety/Delirium protocol (if indicated): Prop, PRN fent  VAP protocol (if indicated): yes DVT prophylaxis: SCDs GI prophylaxis: pepcid  Glucose control: SSI  Mobility: BR Code Status: Full  Family Communication: Updated son at bedside 7/31 with assistance of interpreter Disposition: ICU   Labs   CBC: Recent Labs  Lab 12/21/19 1708 12/21/19 1708 12/21/19 1711 12/21/19 1945 12/22/19 0133 12/23/19 0110 12/24/19 0146  WBC 11.8*  --   --   --  10.6* 15.6* 16.2*  HGB 12.8   < > 13.3 12.2 11.1* 10.9* 10.8*  HCT 40.8   < > 39.0 36.0 34.9* 32.2* 33.1*  MCV 92.1  --   --   --  90.2 88.7 88.7  PLT 226  --   --   --  197 211 211   < > = values in this interval not displayed.    Basic Metabolic Panel: Recent Labs  Lab 12/21/19 1708 12/21/19 1711 12/21/19 1945 12/22/19 1414 12/22/19 1648 12/23/19 0110 12/23/19 1616 12/24/19 0146  NA 131* 131* 131*  --   --  135  --  136  K 4.1  3.8 3.8  --   --  4.1  --  3.7  CL 97*  --   --   --   --  101  --  103  CO2 22  --   --   --   --  26  --  25  GLUCOSE 306*  --   --   --   --  204*  --  263*  BUN 11  --   --   --   --  16  --  21  CREATININE 1.22*  --   --   --   --  0.87  --  0.78  CALCIUM 8.1*  --   --   --   --  8.3*  --  8.2*  MG  --   --   --  1.7 1.8 1.9 2.1  --   PHOS  --   --   --  3.6 3.9 4.2 3.4  --    GFR: Estimated Creatinine Clearance: 54.7 mL/min (by C-G formula based on SCr of 0.78 mg/dL). Recent Labs  Lab 12/21/19 1708 12/21/19 1821 12/21/19 1849 12/21/19 2003 12/22/19 0133 12/23/19 0110 12/24/19 0146  PROCALCITON  --  <0.10  --   --  1.19 0.98  --   WBC  11.8*  --   --   --  10.6* 15.6* 16.2*  LATICACIDVEN  --   --  2.1* 2.5*  --   --   --     Liver Function Tests: Recent Labs  Lab 12/21/19 1708  AST 34  ALT 49*  ALKPHOS 49  BILITOT 0.7  PROT 6.0*  ALBUMIN 2.9*   No results for input(s): LIPASE, AMYLASE in the last 168 hours. No results for input(s): AMMONIA in the last 168 hours.  ABG    Component Value Date/Time   PHART 7.393 12/21/2019 1945   PCO2ART 51.1 (H) 12/21/2019 1945   PO2ART 215 (H) 12/21/2019 1945   HCO3 30.8 (H) 12/21/2019 1945   TCO2 32 12/21/2019 1945   ACIDBASEDEF 1.0 12/21/2019 1711   O2SAT 100.0 12/21/2019 1945     Coagulation Profile: Recent Labs  Lab 12/21/19 1803  INR 1.0    Cardiac Enzymes: No results for input(s): CKTOTAL, CKMB, CKMBINDEX, TROPONINI in the last 168 hours.  HbA1C: HbA1c, POC (controlled diabetic range)  Date/Time Value Ref Range Status  11/28/2019 11:06 AM 6.6 0.0 - 7.0 % Final   Hgb A1c MFr Bld  Date/Time Value Ref Range Status  12/02/2019 05:54 PM 7.1 (H) 4.8 - 5.6 % Final    Comment:    (NOTE) Pre diabetes:          5.7%-6.4%  Diabetes:              >6.4%  Glycemic control for   <7.0% adults with diabetes   07/20/2019 03:56 AM 6.1 (H) 4.8 - 5.6 % Final    Comment:    (NOTE) Pre diabetes:           5.7%-6.4% Diabetes:              >6.4% Glycemic control for   <7.0% adults with diabetes   07/20/2019 03:56 AM 6.0 (H) 4.8 - 5.6 % Final    Comment:    (NOTE) Pre diabetes:          5.7%-6.4% Diabetes:              >6.4% Glycemic control for   <7.0% adults with diabetes     CBG: Recent Labs  Lab 12/23/19 1945 12/23/19 2335 12/24/19 0359 12/24/19 0712 12/24/19 1103  GLUCAP 186* 174* 239* 251* 161*    Independent CC time 32 minutes  Baltazar Apo, MD, PhD 12/24/2019, 11:47 AM [place] [facility] [phone] or if no answer [phone]

## 2019-12-24 NOTE — Progress Notes (Signed)
ANTICOAGULATION CONSULT NOTE   Pharmacy Consult for Heparin  Indication: atrial fibrillation  No Known Allergies  Patient Measurements: Height: 4\' 11"  (149.9 cm) Weight: 69.5 kg (153 lb 3.5 oz) IBW/kg (Calculated) : 43.2  Vital Signs: Temp: 97.6 F (36.4 C) (08/01 0700) Temp Source: Axillary (08/01 0700) BP: 102/64 (08/01 0800) Pulse Rate: 57 (08/01 0800)  Labs: Recent Labs    12/21/19 1708 12/21/19 1711 12/21/19 1803 12/21/19 1849 12/21/19 1945 12/22/19 0133 12/22/19 0133 12/22/19 1210 12/22/19 2248 12/23/19 0110 12/24/19 0146  HGB 12.8   < >  --   --    < > 11.1*   < >  --   --  10.9* 10.8*  HCT 40.8   < >  --   --    < > 34.9*  --   --   --  32.2* 33.1*  PLT 226  --   --   --    < > 197  --   --   --  211 211  APTT  --   --  29  --   --   --   --   --   --   --   --   LABPROT  --   --  13.0  --   --   --   --   --   --   --   --   INR  --   --  1.0  --   --   --   --   --   --   --   --   HEPARINUNFRC  --   --   --   --   --  1.03*  --    < > 0.61 0.47 0.47  CREATININE 1.22*  --   --   --   --   --   --   --   --  0.87 0.78  TROPONINIHS 15  --   --  42*  --   --   --   --   --   --   --    < > = values in this interval not displayed.    Estimated Creatinine Clearance: 54.7 mL/min (by C-G formula based on SCr of 0.78 mg/dL).  Assessment: 74 y.o. female with Afib for heparin  AM level is therapeutic  Goal of Therapy:  Heparin level 0.3-0.7 units/ml Monitor platelets by anticoagulation protocol: Yes   Plan:  Continue Heparin at current rate of 550 units/hr Check qam heparin levels and CBC  Alanda Slim, PharmD, [facility] Clinical Pharmacist Please see AMION for all Pharmacists' Contact Phone Numbers 12/24/2019, 8:34 AM

## 2019-12-25 LAB — BASIC METABOLIC PANEL WITH GFR
Anion gap: 8 (ref 5–15)
BUN: 22 mg/dL (ref 8–23)
CO2: 26 mmol/L (ref 22–32)
Calcium: 8 mg/dL — ABNORMAL LOW (ref 8.9–10.3)
Chloride: 103 mmol/L (ref 98–111)
Creatinine, Ser: 0.69 mg/dL (ref 0.44–1.00)
GFR calc Af Amer: >60 mL/min
GFR calc non Af Amer: >60 mL/min
Glucose, Bld: 201 mg/dL — ABNORMAL HIGH (ref 70–99)
Potassium: 3.9 mmol/L (ref 3.5–5.1)
Sodium: 137 mmol/L (ref 135–145)

## 2019-12-25 LAB — GLUCOSE, CAPILLARY
Glucose-Capillary: 153 mg/dL — ABNORMAL HIGH (ref 70–99)
Glucose-Capillary: 184 mg/dL — ABNORMAL HIGH (ref 70–99)
Glucose-Capillary: 193 mg/dL — ABNORMAL HIGH (ref 70–99)
Glucose-Capillary: 212 mg/dL — ABNORMAL HIGH (ref 70–99)
Glucose-Capillary: 237 mg/dL — ABNORMAL HIGH (ref 70–99)
Glucose-Capillary: 252 mg/dL — ABNORMAL HIGH (ref 70–99)

## 2019-12-25 LAB — CBC
HCT: 32.9 % — ABNORMAL LOW (ref 36.0–46.0)
Hemoglobin: 10.7 g/dL — ABNORMAL LOW (ref 12.0–15.0)
MCH: 28.6 pg (ref 26.0–34.0)
MCHC: 32.5 g/dL (ref 30.0–36.0)
MCV: 88 fL (ref 80.0–100.0)
Platelets: 225 10*3/uL (ref 150–400)
RBC: 3.74 MIL/uL — ABNORMAL LOW (ref 3.87–5.11)
RDW: 17.1 % — ABNORMAL HIGH (ref 11.5–15.5)
WBC: 10.9 10*3/uL — ABNORMAL HIGH (ref 4.0–10.5)
nRBC: 0 % (ref 0.0–0.2)

## 2019-12-25 LAB — HEPARIN LEVEL (UNFRACTIONATED): Heparin Unfractionated: 0.46 IU/mL (ref 0.30–0.70)

## 2019-12-25 LAB — MAGNESIUM: Magnesium: 2.2 mg/dL (ref 1.7–2.4)

## 2019-12-25 LAB — TRIGLYCERIDES: Triglycerides: 64 mg/dL

## 2019-12-25 MED ORDER — INSULIN ASPART 100 UNIT/ML ~~LOC~~ SOLN: 4.0000 [IU] | Freq: Three times a day (TID) | SUBCUTANEOUS | Status: DC

## 2019-12-25 MED ORDER — POLYETHYLENE GLYCOL 3350 17 G PO PACK
17.0000 g | PACK | Freq: Every day | ORAL | Status: DC
  Administered 2019-12-28: 17 g
  Filled 2019-12-25 (×2): qty 1

## 2019-12-25 NOTE — Progress Notes (Addendum)
ANTICOAGULATION CONSULT NOTE   Pharmacy Consult for Heparin  Indication: atrial fibrillation  No Known Allergies  Patient Measurements: Height: 4\' 11"  (149.9 cm) Weight: 69.5 kg (153 lb 3.5 oz) IBW/kg (Calculated) : 43.2  Vital Signs: Temp: 98.2 F (36.8 C) (08/02 0723) Temp Source: Oral (08/02 0723) BP: 120/72 (08/02 0700) Pulse Rate: 46 (08/02 0700)  Labs: Recent Labs    12/23/19 0110 12/23/19 0110 12/24/19 0146 12/25/19 0300  HGB 10.9*   < > 10.8* 10.7*  HCT 32.2*  --  33.1* 32.9*  PLT 211  --  211 225  HEPARINUNFRC 0.47  --  0.47 0.46  CREATININE 0.87  --  0.78 0.69   < > = values in this interval not displayed.    Estimated Creatinine Clearance: 54.7 mL/min (by C-G formula based on SCr of 0.69 mg/dL).  Assessment: 74 y.o. female with Afib for heparin  PTA patient was on lovenox 60mg  BID for afib - previously was on amiodarone and Eliquis July 2021 but both were d/cd for concern they were contributing to angioedema.     AM level is therapeutic again at 0.46, HH stable, PLT WNL. No s/sx bleeding charted.  Goal of Therapy:  Heparin level 0.3-0.7 units/ml Monitor platelets by anticoagulation protocol: Yes   Plan:  Continue heparin at current rate of 550 units/hr Check AM heparin levels and CBC Follow plans for tracheostomy and restarting enoxaparin  Mercy Riding, PharmD PGY1 Acute Care Pharmacy Resident Please refer to [facility] for unit-specific pharmacist

## 2019-12-25 NOTE — Progress Notes (Signed)
NAME:  Jamie Mitchell, MRN:  [id number], DOB:  22-Mar-1946, LOS: 4 ADMISSION DATE:  12/21/2019, CONSULTATION DATE:  12/21/2019 REFERRING MD:  Dr. Ralene Bathe, CHIEF COMPLAINT:  Acute respiratory failure   Brief History   74yo female with known history of recurrent stridor and subglottic edema/stinosis presents with acute hypoxic respiratory failure resulting in intubation by EMS. PCCM consulted for further management and admission.   History of present illness   Jamie Mitchell is a 74yo female with PMX significant for thyroid disease, stroke, hypertension, chronic mastoiditis, and atrial fibrillation who presented to ED with acute hypoxic respiratory failure. Per EMS they were intimally called for complaints of shortness of breath, on arrival patient was found with agonal breathing and significant hypoxia with SPO2 of 60% on RA. Patient intubated and started on solumedrol.  CXR showed bilateral opacities, patient started on azithromycin and ceftriaxone with blood, sputum, and urine cultures collected.   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 During this admission she was seen by ENT and PCCM and treated with BIPAP and steroids.   Past Medical History  Thyroid disease Stroke Hypertension Chronic mastoiditis A-fib  Significant Hospital Events   Admitted with acute hypoxic respiratory failure, intubated per EMS  Consults:  ENT   Procedures:  ETT 7/29 >  Significant Diagnostic Tests:  CT head 7/29 > No CT evidence of acute intracranial abnormality. Redemonstrated large chronic left frontal lobe infarct. 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 Mild ethmoid and left maxillary sinus mucosal thickening. Large left middle ear/mastoid effusion. PFT 12/19/2019 ratio 69% FEV1 80% FVC 139% DLCO 118%  CXR 07/30 > Bilateral pulmonary interstitial  prominence again noted. Bilateral pulmonary alveolar infiltrates again noted. Interim improvement from prior exam. Low lung volumes with bibasilar atelectasis.  Micro Data:  Blood 7/29 > Sputum 7/29 > Urine 7/29 >  Antimicrobials:  Azithro 7/29 >> Rocephin 7/29 >>  Cefepime 7/29 >stopped  Vanc 7/29>stopped    Interim history/subjective:   No significant changes   Objective   Blood pressure 120/72, pulse 46, temperature 98.2 F (36.8 C), temperature source Oral, resp. rate 20, height 4\' 11"  (1.499 m), weight 69.5 kg, SpO2 99 %.    Vent Mode: PRVC FiO2 (%):  [40 %] 40 % Set Rate:  [20 bmp] 20 bmp Vt Set:  [340 mL] 340 mL PEEP:  [5 cmH20] 5 cmH20 Pressure Support:  [5 cmH20] 5 cmH20 Plateau Pressure:  [11 cmH20-13 cmH20] 13 cmH20   Intake/Output Summary (Last 24 hours) at 12/25/2019 0744 Last data filed at 12/25/2019 7829 Gross per 24 hour  Intake 1888.62 ml  Output 1750 ml  Net 138.62 ml   Filed Weights   12/23/19 0400 12/24/19 0434 12/25/19 0500  Weight: 66.1 kg 69.5 kg 69.5 kg    Examination: General: Elderly woman, no distress, ventilated HENT: ET tube in place, pupils equal Lungs: Decreased, clear, no wheezing or crackles Cardiovascular: Regular, no murmur Abdomen: Nondistended, positive bowel sounds Extremities: No edema Neuro: Alert, awake, nods to questions, moves all extremities  Resolved Hospital Problem list     Assessment & Plan:  Acute respiratory failure with hypoxia, hypercarbia, requiring mechanical ventilation 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  Given upper airway findings, recurrent nature of her respiratory failure  I continue to believe she  needs tracheostomy.  Family agrees. She has seen Dr. Lucia Gaskins in the past as well as [facility]. Apparently laryngoscopy between  episodes has been reassuring. I will contact Dr. Lucia Gaskins today 8/2 Empiric antibiotics for CAP, day #4 of five Volume and chest x-ray VAP prevention order set  Hx Subglottic stenosis and glottic edema, PFTs as above.  As above laryngoscopy when asymptomatic has been reassuring.  In order to be safely liberated from MV and prevent recurrent episodes we recommend tracheostomy.  Will try to get in touch with Dr. Lucia Gaskins. Continue Solu-Medrol for now, start to wean on 8/3 Will need an immunology evaluation as an outpatient   Prior L MCA embolic CVA: residual dysphagia, known aspiration Supportive care SLP evaluation when appropriate, extubated  Afib: previously on amio, eliquis-- since discontinued with concern that this may have contributed to prior angioedema. Now on full dose lovenox BID at home P Continue heparin.  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  Sliding-scale insulin as ordered  GERD Pepcid   Best practice:  Diet: NPO  Pain/Anxiety/Delirium protocol (if indicated): Prop, PRN fent  VAP protocol (if indicated): yes DVT prophylaxis: SCDs GI prophylaxis: pepcid  Glucose control: SSI  Mobility: BR Code Status: Full  Family Communication: Updated son at bedside 8/2 Disposition: ICU   Labs   CBC: Recent Labs  Lab 12/21/19 1708 12/21/19 1711 12/21/19 1945 12/22/19 0133 12/23/19 0110 12/24/19 0146 12/25/19 0300  WBC 11.8*  --   --  10.6* 15.6* 16.2* 10.9*  HGB 12.8   < > 12.2 11.1* 10.9* 10.8* 10.7*  HCT 40.8   < > 36.0 34.9* 32.2* 33.1* 32.9*  MCV 92.1  --   --  90.2 88.7 88.7 88.0  PLT 226  --   --  197 211 211 225   < > = values in this interval not displayed.    Basic Metabolic Panel: Recent Labs  Lab 12/21/19 1708 12/21/19 1708 12/21/19 1711 12/21/19 1945 12/22/19 1414 12/22/19 1648 12/23/19 0110 12/23/19 1616 12/24/19 0146 12/25/19 0300  NA 131*   < > 131* 131*  --   --   135  --  136 137  K 4.1   < > 3.8 3.8  --   --  4.1  --  3.7 3.9  CL 97*  --   --   --   --   --  101  --  103 103  CO2 22  --   --   --   --   --  26  --  25 26  GLUCOSE 306*  --   --   --   --   --  204*  --  263* 201*  BUN 11  --   --   --   --   --  16  --  21 22  CREATININE 1.22*  --   --   --   --   --  0.87  --  0.78 0.69  CALCIUM 8.1*  --   --   --   --   --  8.3*  --  8.2* 8.0*  MG  --   --   --   --  1.7 1.8 1.9 2.1  --  2.2  PHOS  --   --   --   --  3.6 3.9 4.2 3.4  --   --    < > = values in  this interval not displayed.   GFR: Estimated Creatinine Clearance: 54.7 mL/min (by C-G formula based on SCr of 0.69 mg/dL). Recent Labs  Lab 12/21/19 1708 12/21/19 1821 12/21/19 1849 12/21/19 2003 12/22/19 0133 12/23/19 0110 12/24/19 0146 12/25/19 0300  PROCALCITON  --  <0.10  --   --  1.19 0.98  --   --   WBC   < >  --   --   --  10.6* 15.6* 16.2* 10.9*  LATICACIDVEN  --   --  2.1* 2.5*  --   --   --   --    < > = values in this interval not displayed.    Liver Function Tests: Recent Labs  Lab 12/21/19 1708  AST 34  ALT 49*  ALKPHOS 49  BILITOT 0.7  PROT 6.0*  ALBUMIN 2.9*   No results for input(s): LIPASE, AMYLASE in the last 168 hours. No results for input(s): AMMONIA in the last 168 hours.  ABG    Component Value Date/Time   PHART 7.393 12/21/2019 1945   PCO2ART 51.1 (H) 12/21/2019 1945   PO2ART 215 (H) 12/21/2019 1945   HCO3 30.8 (H) 12/21/2019 1945   TCO2 32 12/21/2019 1945   ACIDBASEDEF 1.0 12/21/2019 1711   O2SAT 100.0 12/21/2019 1945     Coagulation Profile: Recent Labs  Lab 12/21/19 1803  INR 1.0    Cardiac Enzymes: No results for input(s): CKTOTAL, CKMB, CKMBINDEX, TROPONINI in the last 168 hours.  HbA1C: HbA1c, POC (controlled diabetic range)  Date/Time Value Ref Range Status  11/28/2019 11:06 AM 6.6 0.0 - 7.0 % Final   Hgb A1c MFr Bld  Date/Time Value Ref Range Status  12/02/2019 05:54 PM 7.1 (H) 4.8 - 5.6 % Final    Comment:     (NOTE) Pre diabetes:          5.7%-6.4%  Diabetes:              >6.4%  Glycemic control for   <7.0% adults with diabetes   07/20/2019 03:56 AM 6.1 (H) 4.8 - 5.6 % Final    Comment:    (NOTE) Pre diabetes:          5.7%-6.4% Diabetes:              >6.4% Glycemic control for   <7.0% adults with diabetes   07/20/2019 03:56 AM 6.0 (H) 4.8 - 5.6 % Final    Comment:    (NOTE) Pre diabetes:          5.7%-6.4% Diabetes:              >6.4% Glycemic control for   <7.0% adults with diabetes     CBG: Recent Labs  Lab 12/24/19 1504 12/24/19 1923 12/24/19 2317 12/25/19 0309 12/25/19 0721  GLUCAP 185* 234* 231* 184* 212*    Independent CC time 31 minutes  Baltazar Apo, MD, PhD 12/25/2019, 7:44 AM [facility] [phone] or if no answer [phone]

## 2019-12-25 NOTE — Progress Notes (Signed)
PCCM Interval Note  I left message for Dr Rachel Moulds with ENT at [facility] this am. Still haven't heard back.   Patient's family tells me that the discussion they had with Dr Rachel Moulds suggested that if she has a recurrence (like this one) that she would get a trach. I will try to confirm the plan with [facility]NT, but it may be wise to consult [facility] here in prep for a procedure. Dr Lucia Gaskins indicated that he did not want to perform trach since the patient was no longer his, was being cared for at [facility].   Baltazar Apo, MD, PhD 12/25/2019, 5:56 PM [facility] [phone] or if no answer [phone]

## 2019-12-25 NOTE — Progress Notes (Signed)
PCCM interval note   I reviewed the patient's case with Dr. Lucia Gaskins with ENT.  She has had waxing and waning episodes of edema that seem to improve and almost resolve with steroids, then has recurrence of respiratory distress.  She was seen by ENT Dr. Rachel Moulds at [facility] and underwent DL with balloon dilation and steroid injection of subglottic stenosis.  Dr. Lucia Gaskins recommends discussion of the case with Dr. Rachel Moulds to determine next steps.  I am concerned about extubating without upper airway visualization.  She may ultimately require tracheostomy.  Question whether she may require transfer to [facility].  I will attempt to contact Dr. Rachel Moulds to discuss.  Baltazar Apo, MD, PhD 12/25/2019, 9:44 AM [facility] [phone] or if no answer [phone]

## 2019-12-25 NOTE — Progress Notes (Signed)
Inpatient Diabetes Program Recommendations  AACE/ADA: New Consensus Statement on Inpatient Glycemic Control (2015)  Target Ranges:  Prepandial:   less than 140 mg/dL      Peak postprandial:   less than 180 mg/dL (1-2 hours)      Critically ill patients:  140 - 180 mg/dL   Results for Jamie Mitchell, Jamie Mitchell (MRN [id number]) as of 12/25/2019 09:53  Ref. Range 12/23/2019 23:35 12/24/2019 03:59 12/24/2019 07:12 12/24/2019 11:03 12/24/2019 15:04 12/24/2019 19:23  Glucose-Capillary Latest Ref Range: 70 - 99 mg/dL 174 (H)  4 units NOVOLOG 239 (H)  7 units NOVOLOG  251 (H)  11 units NOVOLOG  161 (H)  4 units NOVOLOG  185 (H) 234 (H)  7 units NOVOLOG    Results for Jamie Mitchell, Jamie Mitchell (MRN [id number]) as of 12/25/2019 09:53  Ref. Range 12/24/2019 23:17 12/25/2019 03:09 12/25/2019 07:21  Glucose-Capillary Latest Ref Range: 70 - 99 mg/dL 231 (H)  7 units NOVOLOG  184 (H)  4 units NOVOLOG 212 (H)  7 units NOVOLOG     Home DM Meds: Metformin 500 mg BID  Current Orders: Novolog Resistant Correction Scale/ SSI (0-20 units) Q4 hours    MD- Note patient getting Solumedrol 40 mg BID.  Also getting Tube Feeds at 45 cc/hr.  Having some glucose elevations.  Please consider adding Novolog Tube Feed Coverage: Novolog 4 units Q4 hours  HOLD if Tube Feeds HELD for any reason     --Will follow patient during hospitalization--  Wyn Quaker RN, MSN, CDE Diabetes Coordinator Inpatient Glycemic Control Team Team Pager: [phone] (8a-5p)

## 2019-12-26 ENCOUNTER — Ambulatory Visit: Payer: Self-pay | Admitting: *Deleted

## 2019-12-26 LAB — HEPARIN LEVEL (UNFRACTIONATED): Heparin Unfractionated: 0.31 IU/mL (ref 0.30–0.70)

## 2019-12-26 LAB — CBC
HCT: 34 % — ABNORMAL LOW (ref 36.0–46.0)
Hemoglobin: 11.1 g/dL — ABNORMAL LOW (ref 12.0–15.0)
MCH: 28.7 pg (ref 26.0–34.0)
MCHC: 32.6 g/dL (ref 30.0–36.0)
MCV: 87.9 fL (ref 80.0–100.0)
Platelets: 239 10*3/uL (ref 150–400)
RBC: 3.87 MIL/uL (ref 3.87–5.11)
RDW: 17.2 % — ABNORMAL HIGH (ref 11.5–15.5)
WBC: 10.1 10*3/uL (ref 4.0–10.5)
nRBC: 0 % (ref 0.0–0.2)

## 2019-12-26 LAB — GLUCOSE, CAPILLARY
Glucose-Capillary: 197 mg/dL — ABNORMAL HIGH (ref 70–99)
Glucose-Capillary: 197 mg/dL — ABNORMAL HIGH (ref 70–99)
Glucose-Capillary: 224 mg/dL — ABNORMAL HIGH (ref 70–99)
Glucose-Capillary: 224 mg/dL — ABNORMAL HIGH (ref 70–99)
Glucose-Capillary: 224 mg/dL — ABNORMAL HIGH (ref 70–99)
Glucose-Capillary: 114 mg/dL — ABNORMAL HIGH (ref 70–99)
Glucose-Capillary: 215 mg/dL — ABNORMAL HIGH (ref 70–99)

## 2019-12-26 LAB — CULTURE, BLOOD (ROUTINE X 2)
Culture: NO GROWTH
Special Requests: ADEQUATE

## 2019-12-26 LAB — TRIGLYCERIDES: Triglycerides: 61 mg/dL (ref <150)

## 2019-12-26 MED ORDER — METHYLPREDNISOLONE SODIUM SUCC 125 MG IJ SOLR
40.0000 mg | Freq: Every day | INTRAMUSCULAR | Status: DC
  Administered 2019-12-27: 40 mg via INTRAVENOUS
  Filled 2019-12-26: qty 2

## 2019-12-26 MED ORDER — INSULIN ASPART 100 UNIT/ML ~~LOC~~ SOLN
4.0000 [IU] | SUBCUTANEOUS | Status: DC
  Administered 2019-12-26 – 2019-12-27 (×5): 4 [IU] via SUBCUTANEOUS

## 2019-12-26 NOTE — Progress Notes (Signed)
NAME:  Jamie Mitchell, MRN:  [id number], DOB:  24-Jul-1945, LOS: 5 ADMISSION DATE:  12/21/2019, CONSULTATION DATE:  12/21/2019 REFERRING MD:  Dr. Ralene Bathe, CHIEF COMPLAINT:  Acute respiratory failure   Brief History   74yo female with known history of recurrent stridor and subglottic edema/stinosis presents with acute hypoxic respiratory failure resulting in intubation by EMS. PCCM consulted for further management and admission.   History of present illness   Jamie Mitchell is a 74yo female with PMX significant for thyroid disease, stroke, hypertension, chronic mastoiditis, and atrial fibrillation who presented to ED with acute hypoxic respiratory failure. Per EMS they were intimally called for complaints of shortness of breath, on arrival patient was found with agonal breathing and significant hypoxia with SPO2 of 60% on RA. Patient intubated and started on solumedrol.  CXR showed bilateral opacities, patient started on azithromycin and ceftriaxone with blood, sputum, and urine cultures collected.   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 During this admission she was seen by ENT and PCCM and treated with BIPAP and steroids.   Past Medical History  Thyroid disease Stroke Hypertension Chronic mastoiditis A-fib  Significant Hospital Events   Admitted with acute hypoxic respiratory failure, intubated per EMS  Consults:  ENT   Procedures:  ETT 7/29 >  Significant Diagnostic Tests:  CT head 7/29 > No CT evidence of acute intracranial abnormality. Redemonstrated large chronic left frontal lobe infarct. 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 Mild ethmoid and left maxillary sinus mucosal thickening. Large left middle ear/mastoid effusion. PFT 12/19/2019 ratio 69% FEV1 80% FVC 139% DLCO 118%  CXR 07/30 > Bilateral pulmonary interstitial  prominence again noted. Bilateral pulmonary alveolar infiltrates again noted. Interim improvement from prior exam. Low lung volumes with bibasilar atelectasis.  Micro Data:  Sars2 7/29: negative Blood 7/29 > ngtd Sputum 7/29 > Urine 7/29 > ngtd  Antimicrobials:  Azithro 7/29 >>8/2 Rocephin 7/29 >>  Cefepime 7/29 >stopped  Vanc 7/29>stopped    Interim history/subjective:   8/3: remains intubated. Following up with previous attending to see whether [facility] reached back out to determine plan. Agree with extubation not being ideal without eval. Awaiting call back from [facility] to explain situation. Pt is alert and awake and following commands. Son is frustrated with delays as expected.    Objective   Blood pressure 107/66, pulse (!) 52, temperature (!) 97.4 F (36.3 C), temperature source Axillary, resp. rate 20, height 4\' 11"  (1.499 m), weight 66.7 kg, SpO2 100 %.    Vent Mode: PRVC FiO2 (%):  [40 %] 40 % Set Rate:  [20 bmp] 20 bmp Vt Set:  [340 mL] 340 mL PEEP:  [5 cmH20] 5 cmH20 Pressure Support:  [8 cmH20] 8 cmH20 Plateau Pressure:  [12 cmH20] 12 cmH20   Intake/Output Summary (Last 24 hours) at 12/26/2019 0753 Last data filed at 12/26/2019 0600 Gross per 24 hour  Intake 1586.34 ml  Output 800 ml  Net 786.34 ml   Filed Weights   12/24/19 0434 12/25/19 0500 12/26/19 0416  Weight: 69.5 kg 69.5 kg 66.7 kg    Examination: General: Elderly woman, no distress, ventilated HENT: NCAT, ET tube in place, pupils equal Lungs: rhonchi bilaterally, no wheezing  Cardiovascular: Regular, no murmur Abdomen: Nondistended, positive bowel sounds Extremities: No edema Neuro: Alert, awake, nods to questions, moves all extremities  Resolved Hospital Problem list     Assessment & Plan:  Acute respiratory failure with hypoxia, hypercarbia, requiring mechanical ventilation 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  Given upper airway findings, recurrent nature of her respiratory failure  I continue to believe she needs tracheostomy.  Family agrees. She has seen Dr. Lucia Gaskins in the past as well as [facility]. Apparently laryngoscopy between episodes has been reassuring. -per notes Dr Lucia Gaskins has indicated they would not perform trach as pt is no longer theirs -s/p empiric abx (completed azithro) VAP prevention order set  Hx Subglottic stenosis and glottic edema, PFTs as above.  As above laryngoscopy when asymptomatic has been reassuring.  In order to be safely liberated from MV and prevent recurrent episodes we recommend tracheostomy.  -will attempt to contact [facility] ent  -if unreachable this am. Will contact [facility] for eval, awaiting call back -Continue Solu-Medrol for now decrease to daily -Will need an immunology evaluation as an outpatient   Prior L MCA embolic CVA: residual dysphagia, known aspiration Supportive care SLP evaluation when appropriate, extubated  Afib: previously on amio, eliquis-- since discontinued with concern that this may have contributed to prior angioedema. Now on full dose lovenox BID at home P Continue heparin.  She is on enoxaparin at baseline, plan to restart when finished with procedures  Chronic normocytic anemia:  -stable, follow  DM2 with hyperglycemia:  - exacerbated by steroids, weaning today Sliding-scale insulin as ordered  GERD Pepcid   Best practice:  Diet: NPO  Pain/Anxiety/Delirium protocol (if indicated): Prop, PRN fent  VAP protocol (if indicated): yes DVT prophylaxis: SCDs GI prophylaxis: pepcid  Glucose control: SSI  Mobility: BR Code Status: Full  Family Communication: Updated son at bedside 8/3 Disposition: ICU   Labs   CBC: Recent Labs  Lab 12/22/19 0133 12/23/19 0110 12/24/19 0146 12/25/19 0300 12/26/19 0233  WBC 10.6* 15.6* 16.2*  10.9* 10.1  HGB 11.1* 10.9* 10.8* 10.7* 11.1*  HCT 34.9* 32.2* 33.1* 32.9* 34.0*  MCV 90.2 88.7 88.7 88.0 87.9  PLT 197 211 211 225 250    Basic Metabolic Panel: Recent Labs  Lab 12/21/19 1708 12/21/19 1708 12/21/19 1711 12/21/19 1945 12/22/19 1414 12/22/19 1648 12/23/19 0110 12/23/19 1616 12/24/19 0146 12/25/19 0300  NA 131*   < > 131* 131*  --   --  135  --  136 137  K 4.1   < > 3.8 3.8  --   --  4.1  --  3.7 3.9  CL 97*  --   --   --   --   --  101  --  103 103  CO2 22  --   --   --   --   --  26  --  25 26  GLUCOSE 306*  --   --   --   --   --  204*  --  263* 201*  BUN 11  --   --   --   --   --  16  --  21 22  CREATININE 1.22*  --   --   --   --   --  0.87  --  0.78 0.69  CALCIUM 8.1*  --   --   --   --   --  8.3*  --  8.2* 8.0*  MG  --   --   --   --  1.7 1.8 1.9 2.1  --  2.2  PHOS  --   --   --   --  3.6 3.9 4.2 3.4  --   --    < > = values in this interval not displayed.   GFR: Estimated Creatinine Clearance: 53.6 mL/min (by C-G formula based on SCr of 0.69 mg/dL). Recent Labs  Lab 12/21/19 1708 12/21/19 1821 12/21/19 1849 12/21/19 2003 12/22/19 0133 12/22/19 0133 12/23/19 0110 12/24/19 0146 12/25/19 0300 12/26/19 0233  PROCALCITON  --  <0.10  --   --  1.19  --  0.98  --   --   --   WBC   < >  --   --   --  10.6*   < > 15.6* 16.2* 10.9* 10.1  LATICACIDVEN  --   --  2.1* 2.5*  --   --   --   --   --   --    < > = values in this interval not displayed.    Liver Function Tests: Recent Labs  Lab 12/21/19 1708  AST 34  ALT 49*  ALKPHOS 49  BILITOT 0.7  PROT 6.0*  ALBUMIN 2.9*   No results for input(s): LIPASE, AMYLASE in the last 168 hours. No results for input(s): AMMONIA in the last 168 hours.  ABG    Component Value Date/Time   PHART 7.393 12/21/2019 1945   PCO2ART 51.1 (H) 12/21/2019 1945   PO2ART 215 (H) 12/21/2019 1945   HCO3 30.8 (H) 12/21/2019 1945   TCO2 32 12/21/2019 1945   ACIDBASEDEF 1.0 12/21/2019 1711   O2SAT 100.0 12/21/2019  1945     Coagulation Profile: Recent Labs  Lab 12/21/19 1803  INR 1.0    Cardiac Enzymes: No results for input(s): CKTOTAL, CKMB, CKMBINDEX, TROPONINI in the last 168 hours.  HbA1C: HbA1c, POC (controlled diabetic range)  Date/Time Value Ref Range Status  11/28/2019 11:06 AM 6.6 0.0 - 7.0 % Final   Hgb A1c MFr Bld  Date/Time Value Ref Range Status  12/02/2019 05:54 PM 7.1 (H) 4.8 - 5.6 % Final    Comment:    (NOTE) Pre diabetes:          5.7%-6.4%  Diabetes:              >6.4%  Glycemic control for   <7.0% adults with diabetes   07/20/2019 03:56 AM 6.1 (H) 4.8 - 5.6 % Final    Comment:    (NOTE) Pre diabetes:          5.7%-6.4% Diabetes:              >6.4% Glycemic control for   <7.0% adults with diabetes   07/20/2019 03:56 AM 6.0 (H) 4.8 - 5.6 % Final    Comment:    (NOTE) Pre diabetes:          5.7%-6.4% Diabetes:              >6.4% Glycemic control for   <7.0% adults with diabetes     CBG: Recent Labs  Lab 12/25/19 1455 12/25/19 1924 12/25/19 2340 12/26/19 0355 12/26/19 0704  GLUCAP 153* 252* 193* 197* 224*    Critical care time: The patient is critically ill with multiple organ systems failure and requires high complexity decision making for assessment and support, frequent evaluation and titration of therapies, application of advanced monitoring technologies and extensive interpretation of multiple databases.  Critical care time 38 mins. This represents my time independent of the NPs time taking care of the pt. This  is excluding procedures.    Audria Nine DO [facility] 12/26/2019, 7:59 AM

## 2019-12-26 NOTE — Consult Note (Signed)
  Asked to evaluate patient for possible tracheostomy.  She has a complicated history recently of recurrent airway edema and airway distress.  She has been seen by multiple otolaryngologists here in town including Dr. Lucia Gaskins, Dr. Janace Hoard, Dr. Benjamine Mola.  The critical care service has been unable to get any other ENTs back in to see her and evaluate her for tracheostomy.  A few weeks ago she underwent [employer] laryngoscopy with dilation of the subglottic stenosis by Dr. Rowe Clack at [facility].  She has not had follow-up since then.  She was readmitted a couple of days ago and reintubated.  We do not know the etiology of the subglottic stenosis.  She is currently intubated but awake and alert.  She has 2 sons by her side, 1 of whom lives here in town, and speaks very good [place].  I had a long discussion with him about situation.  I agree that the best next course of action would be tracheostomy.  That will allow her to be weaned off the ventilator quickly.  If the only problem with her respiratory status is the subglottic stenosis then this should relieve that completely.  We can work on treating the subglottic stenosis electively as an outpatient if she has a stable airway with tracheostomy.  On examination, she is awake and alert with an oral tube in place.  Her neck is free of any masses.  I will try to schedule her for tracheostomy as soon as possible.  All questions were answered.

## 2019-12-26 NOTE — Progress Notes (Signed)
ANTICOAGULATION CONSULT NOTE   Pharmacy Consult for Heparin  Indication: atrial fibrillation  No Known Allergies  Patient Measurements: Height: 4\' 11"  (149.9 cm) Weight: 66.7 kg (147 lb 0.8 oz) IBW/kg (Calculated) : 43.2  Vital Signs: Temp: 97.6 F (36.4 C) (08/03 0400) Temp Source: Oral (08/03 0400) BP: 121/73 (08/03 0500) Pulse Rate: 47 (08/03 0500)  Labs: Recent Labs    12/24/19 0146 12/24/19 0146 12/25/19 0300 12/26/19 0233  HGB 10.8*   < > 10.7* 11.1*  HCT 33.1*  --  32.9* 34.0*  PLT 211  --  225 239  HEPARINUNFRC 0.47  --  0.46 0.31  CREATININE 0.78  --  0.69  --    < > = values in this interval not displayed.    Estimated Creatinine Clearance: 53.6 mL/min (by C-G formula based on SCr of 0.69 mg/dL).  Assessment: 74 y.o. female with Afib for heparin  PTA patient was on lovenox 60mg  BID for afib - previously was on amiodarone and Eliquis July 2021 but both were d/cd for concern they were contributing to angioedema.     AM level is down from yesterday but still therapeutic at 0.31. Will keep the same rate and check AM level tomorrow. HH stable, PLT WNL. No s/sx bleeding charted. No plans for trach yet as of this morning - CC is waiting for response from [facility].  Goal of Therapy:  Heparin level 0.3-0.7 units/ml Monitor platelets by anticoagulation protocol: Yes   Plan:  Continue heparin at current rate of 550 units/hr Check AM heparin levels and CBC Follow plans for tracheostomy and restarting enoxaparin  Mercy Riding, PharmD PGY1 Acute Care Pharmacy Resident Please refer to [facility] for unit-specific pharmacist

## 2019-12-27 ENCOUNTER — Inpatient Hospital Stay (HOSPITAL_COMMUNITY): Payer: Medicare Other | Admitting: Certified Registered Nurse Anesthetist

## 2019-12-27 ENCOUNTER — Encounter (HOSPITAL_COMMUNITY)
Admission: EM | Disposition: A | Discharge status: Home / Self Care | Payer: Self-pay | Source: Home / Self Care | Attending: Critical Care Medicine

## 2019-12-27 DIAGNOSIS — R061 Stridor: Secondary | ICD-10-CM | POA: Diagnosis not present

## 2019-12-27 HISTORY — PX: TRACHEOSTOMY TUBE PLACEMENT: SHX814

## 2019-12-27 LAB — GLUCOSE, CAPILLARY
Glucose-Capillary: 101 mg/dL — ABNORMAL HIGH (ref 70–99)
Glucose-Capillary: 125 mg/dL — ABNORMAL HIGH (ref 70–99)
Glucose-Capillary: 165 mg/dL — ABNORMAL HIGH (ref 70–99)
Glucose-Capillary: 191 mg/dL — ABNORMAL HIGH (ref 70–99)
Glucose-Capillary: 197 mg/dL — ABNORMAL HIGH (ref 70–99)
Glucose-Capillary: 203 mg/dL — ABNORMAL HIGH (ref 70–99)
Glucose-Capillary: 41 mg/dL — CL (ref 70–99)

## 2019-12-27 LAB — BASIC METABOLIC PANEL
Anion gap: 8 (ref 5–15)
BUN: 26 mg/dL — ABNORMAL HIGH (ref 8–23)
CO2: 27 mmol/L (ref 22–32)
Calcium: 8 mg/dL — ABNORMAL LOW (ref 8.9–10.3)
Chloride: 103 mmol/L (ref 98–111)
Creatinine, Ser: 0.79 mg/dL (ref 0.44–1.00)
GFR calc Af Amer: >60 mL/min (ref >60)
GFR calc non Af Amer: >60 mL/min (ref >60)
Glucose, Bld: 159 mg/dL — ABNORMAL HIGH (ref 70–99)
Potassium: 3.6 mmol/L (ref 3.5–5.1)
Sodium: 138 mmol/L (ref 135–145)

## 2019-12-27 LAB — CBC
HCT: 34.7 % — ABNORMAL LOW (ref 36.0–46.0)
Hemoglobin: 11.3 g/dL — ABNORMAL LOW (ref 12.0–15.0)
MCH: 28.7 pg (ref 26.0–34.0)
MCHC: 32.6 g/dL (ref 30.0–36.0)
MCV: 88.1 fL (ref 80.0–100.0)
Platelets: 239 10*3/uL (ref 150–400)
RBC: 3.94 MIL/uL (ref 3.87–5.11)
RDW: 17.2 % — ABNORMAL HIGH (ref 11.5–15.5)
WBC: 11.1 10*3/uL — ABNORMAL HIGH (ref 4.0–10.5)
nRBC: 0.4 % — ABNORMAL HIGH (ref 0.0–0.2)

## 2019-12-27 LAB — CULTURE, BLOOD (ROUTINE X 2)
Culture: NO GROWTH
Special Requests: ADEQUATE

## 2019-12-27 LAB — TRIGLYCERIDES: Triglycerides: 60 mg/dL (ref <150)

## 2019-12-27 LAB — HEPARIN LEVEL (UNFRACTIONATED): Heparin Unfractionated: 0.24 IU/mL — ABNORMAL LOW (ref 0.30–0.70)

## 2019-12-27 SURGERY — CREATION, TRACHEOSTOMY
Anesthesia: General

## 2019-12-27 MED ORDER — SODIUM CHLORIDE 0.9 % IR SOLN
Status: DC | PRN
  Administered 2019-12-27: 1000 mL

## 2019-12-27 MED ORDER — ENOXAPARIN SODIUM 80 MG/0.8ML ~~LOC~~ SOLN
1.0000 mg/kg | Freq: Two times a day (BID) | SUBCUTANEOUS | Status: DC
  Administered 2019-12-27 – 2019-12-29 (×4): 70 mg via SUBCUTANEOUS
  Filled 2019-12-27 (×4): qty 0.7

## 2019-12-27 MED ORDER — DEXTROSE 50 % IV SOLN
25.0000 g | INTRAVENOUS | Status: AC
  Administered 2019-12-27: 25 g via INTRAVENOUS

## 2019-12-27 MED ORDER — SODIUM CHLORIDE 0.9 % IV SOLN
INTRAVENOUS | Status: DC | PRN
Start: 2019-12-27 — End: 2019-12-27

## 2019-12-27 MED ORDER — FENTANYL CITRATE (PF) 100 MCG/2ML IJ SOLN
INTRAMUSCULAR | Status: DC | PRN
  Administered 2019-12-27 (×2): 50 ug via INTRAVENOUS

## 2019-12-27 MED ORDER — DEXTROSE-NACL 5-0.9 % IV SOLN: INTRAVENOUS | Status: DC

## 2019-12-27 MED ORDER — DEXTROSE 50 % IV SOLN
INTRAVENOUS | Status: AC
  Filled 2019-12-27: qty 50

## 2019-12-27 MED ORDER — MIDAZOLAM HCL 5 MG/5ML IJ SOLN
INTRAMUSCULAR | Status: DC | PRN
  Administered 2019-12-27: 2 mg via INTRAVENOUS

## 2019-12-27 MED ORDER — FENTANYL CITRATE (PF) 250 MCG/5ML IJ SOLN
INTRAMUSCULAR | Status: AC
  Filled 2019-12-27: qty 5

## 2019-12-27 MED ORDER — LIDOCAINE-EPINEPHRINE 1 %-1:100000 IJ SOLN
INTRAMUSCULAR | Status: AC
  Filled 2019-12-27: qty 1

## 2019-12-27 MED ORDER — MIDAZOLAM HCL 2 MG/2ML IJ SOLN
INTRAMUSCULAR | Status: AC
  Filled 2019-12-27: qty 2

## 2019-12-27 MED ORDER — ROCURONIUM BROMIDE 10 MG/ML (PF) SYRINGE
PREFILLED_SYRINGE | INTRAVENOUS | Status: DC | PRN
  Administered 2019-12-27: 20 mg via INTRAVENOUS

## 2019-12-27 MED ORDER — PROPOFOL 10 MG/ML IV BOLUS
INTRAVENOUS | Status: AC
  Filled 2019-12-27: qty 20

## 2019-12-27 SURGICAL SUPPLY — 28 items
CANISTER SUCT 3000ML PPV (MISCELLANEOUS) ×2 IMPLANT
CLEANER TIP ELECTROSURG 2X2 (MISCELLANEOUS) ×2 IMPLANT
COVER SURGICAL LIGHT HANDLE (MISCELLANEOUS) ×2 IMPLANT
DRSG DRAWTEX TRACH 4X4 (GAUZE/BANDAGES/DRESSINGS) ×2 IMPLANT
ELECT COATED BLADE 2.86 ST (ELECTRODE) ×2 IMPLANT
ELECT REM PT RETURN 9FT ADLT (ELECTROSURGICAL) ×2
ELECTRODE REM PT RTRN 9FT ADLT (ELECTROSURGICAL) ×1 IMPLANT
GLOVE ECLIPSE 7.5 STRL STRAW (GLOVE) ×2 IMPLANT
GOWN STRL REUS W/ TWL LRG LVL3 (GOWN DISPOSABLE) ×2 IMPLANT
GOWN STRL REUS W/TWL LRG LVL3 (GOWN DISPOSABLE) ×4
KIT BASIN OR (CUSTOM PROCEDURE TRAY) ×2 IMPLANT
KIT TURNOVER KIT B (KITS) ×2 IMPLANT
NEEDLE PRECISIONGLIDE 27X1.5 (NEEDLE) ×2 IMPLANT
NS IRRIG 1000ML POUR BTL (IV SOLUTION) ×2 IMPLANT
PAD ARMBOARD 7.5X6 YLW CONV (MISCELLANEOUS) ×4 IMPLANT
PENCIL FOOT CONTROL (ELECTRODE) ×2 IMPLANT
SPONGE DRAIN TRACH 4X4 STRL 2S (GAUZE/BANDAGES/DRESSINGS) ×2 IMPLANT
SUT CHROMIC 2 0 SH (SUTURE) ×2 IMPLANT
SUT ETHILON 3 0 PS 1 (SUTURE) ×2 IMPLANT
SUT SILK 4 0 (SUTURE) ×2
SUT SILK 4 0 TIE 10X30 (SUTURE) ×2 IMPLANT
SUT SILK 4-0 18XBRD TIE 12 (SUTURE) ×1 IMPLANT
SYR 20ML LL LF (SYRINGE) ×2 IMPLANT
SYR CONTROL 10ML LL (SYRINGE) IMPLANT
TOWEL GREEN STERILE FF (TOWEL DISPOSABLE) ×2 IMPLANT
TRAY ENT MC OR (CUSTOM PROCEDURE TRAY) ×2 IMPLANT
TUBE CONNECTING 12X1/4 (SUCTIONS) ×2 IMPLANT
TUBE TRACH SHILEY  6 DIST  CUF (TUBING) ×2 IMPLANT

## 2019-12-27 NOTE — Anesthesia Procedure Notes (Signed)
Date/Time: 12/27/2019 11:15 AM Performed by: Eligha Bridegroom, CRNA Pre-anesthesia Checklist: Patient identified, Emergency Drugs available, Suction available, Patient being monitored and Timeout performed Patient Re-evaluated:Patient Re-evaluated prior to induction Oxygen Delivery Method: Circle system utilized Preoxygenation: Pre-oxygenation with 100% oxygen Induction Type: IV induction Airway Equipment and Method: Tracheostomy Placement Confirmation: positive ETCO2 and breath sounds checked- equal and bilateral

## 2019-12-27 NOTE — Progress Notes (Signed)
NAME:  Jamie Mitchell, MRN:  [id number], DOB:  1946-01-08, LOS: 6 ADMISSION DATE:  12/21/2019, CONSULTATION DATE:  12/21/2019 REFERRING MD:  Dr. Ralene Bathe, CHIEF COMPLAINT:  Acute respiratory failure   Brief History   74yo female with known history of recurrent stridor and subglottic edema/stinosis presents with acute hypoxic respiratory failure resulting in intubation by EMS. PCCM consulted for further management and admission.   History of present illness   Jamie Mitchell is a 74yo female with PMX significant for thyroid disease, stroke, hypertension, chronic mastoiditis, and atrial fibrillation who presented to ED with acute hypoxic respiratory failure. Per EMS they were intimally called for complaints of shortness of breath, on arrival patient was found with agonal breathing and significant hypoxia with SPO2 of 60% on RA. Patient intubated and started on solumedrol.  CXR showed bilateral opacities, patient started on azithromycin and ceftriaxone with blood, sputum, and urine cultures collected.   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 During this admission she was seen by ENT and PCCM and treated with BIPAP and steroids.   Past Medical History  Thyroid disease Stroke Hypertension Chronic mastoiditis A-fib  Significant Hospital Events   Admitted with acute hypoxic respiratory failure, intubated per EMS  Consults:  ENT   Procedures:  ETT 7/29 >  Significant Diagnostic Tests:  CT head 7/29 > No CT evidence of acute intracranial abnormality. Redemonstrated large chronic left frontal lobe infarct. 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 Mild ethmoid and left maxillary sinus mucosal thickening. Large left middle ear/mastoid effusion. PFT 12/19/2019 ratio 69% FEV1 80% FVC 139% DLCO 118%  CXR 07/30 > Bilateral pulmonary interstitial  prominence again noted. Bilateral pulmonary alveolar infiltrates again noted. Interim improvement from prior exam. Low lung volumes with bibasilar atelectasis.  Micro Data:  Sars2 7/29: negative Blood 7/29 > ngtd Sputum 7/29 > Urine 7/29 > ngtd  Antimicrobials:  Azithro 7/29 >>8/2 Rocephin 7/29 >>  Cefepime 7/29 >stopped  Vanc 7/29>stopped    Interim history/subjective:  8/4: doing well remains intubated, for trach later today. On heparin but RN aware of stop time for procedure. Tf held. Pt is comfortable on vent at this time.  8/3: remains intubated. Following up with previous attending to see whether [facility] reached back out to determine plan. Agree with extubation not being ideal without eval. Awaiting call back from [facility] ENT to explain situation. Pt is alert and awake and following commands. Son is frustrated with delays as expected.    Objective   Blood pressure 100/64, pulse (!) 59, temperature 97.9 F (36.6 C), temperature source Oral, resp. rate 20, height 4\' 11"  (1.499 m), weight 68.8 kg, SpO2 100 %.    Vent Mode: PRVC FiO2 (%):  [40 %] 40 % Set Rate:  [20 bmp] 20 bmp Vt Set:  [340 mL] 340 mL PEEP:  [5 cmH20] 5 cmH20 Pressure Support:  [5 cmH20] 5 cmH20 Plateau Pressure:  [12 cmH20-15 cmH20] 12 cmH20   Intake/Output Summary (Last 24 hours) at 12/27/2019 0819 Last data filed at 12/27/2019 0700 Gross per 24 hour  Intake 671.98 ml  Output 900 ml  Net -228.02 ml   Filed Weights   12/25/19 0500 12/26/19 0416 12/27/19 0500  Weight: 69.5 kg 66.7 kg 68.8 kg    Examination: General: Elderly woman, no distress, ventilated HENT: NCAT, ET tube in place, pupils equal Lungs: rhonchi bilaterally, no wheezing  Cardiovascular: Regular, no murmur Abdomen:  Nondistended, positive bowel sounds Extremities: No edema Neuro: Alert, awake, nods to questions, moves all extremities  Resolved Hospital Problem list     Assessment & Plan:  Acute respiratory failure with hypoxia,  hypercarbia, requiring mechanical ventilation 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 -trach today with Dr Constance Holster, who is GREATLY appreciated for this pt -s/p empiric abx (completed azithro) VAP prevention order set  Hx Subglottic stenosis and glottic edema, PFTs as above.  -trach today -Continue Solu-Medrol daily today -Will need an immunology evaluation as an outpatient   Prior L MCA embolic CVA: residual dysphagia, known aspiration Supportive care SLP evaluation when appropriate, extubated  Afib: previously on amio, eliquis-- since discontinued with concern that this may have contributed to prior angioedema. Now on full dose lovenox BID at home P Continue heparin.  She is on enoxaparin at baseline, plan to restart when finished with procedures  Chronic normocytic anemia:  -stable, follow  DM2 with hyperglycemia:  - exacerbated by steroids Sliding-scale insulin as ordered -tf held for procedure today.   GERD Pepcid   Best practice:  Diet: NPO  Pain/Anxiety/Delirium protocol (if indicated): Prop, PRN fent  VAP protocol (if indicated): yes DVT prophylaxis: SCDs GI prophylaxis: pepcid  Glucose control: SSI  Mobility: BR Code Status: Full  Family Communication: Updated son at bedside 8/4 Disposition: ICU   Labs   CBC: Recent Labs  Lab 12/23/19 0110 12/24/19 0146 12/25/19 0300 12/26/19 0233 12/27/19 0142  WBC 15.6* 16.2* 10.9* 10.1 11.1*  HGB 10.9* 10.8* 10.7* 11.1* 11.3*  HCT 32.2* 33.1* 32.9* 34.0* 34.7*  MCV 88.7 88.7 88.0 87.9 88.1  PLT 211 211 225 239 539    Basic Metabolic Panel: Recent Labs  Lab 12/21/19 1708 12/21/19 1711 12/21/19 1945 12/22/19 1414 12/22/19 1648 12/23/19 0110 12/23/19 1616 12/24/19 0146 12/25/19 0300 12/27/19 0142  NA 131*   < > 131*  --   --  135  --   136 137 138  K 4.1   < > 3.8  --   --  4.1  --  3.7 3.9 3.6  CL 97*  --   --   --   --  101  --  103 103 103  CO2 22  --   --   --   --  26  --  25 26 27   GLUCOSE 306*  --   --   --   --  204*  --  263* 201* 159*  BUN 11  --   --   --   --  16  --  21 22 26*  CREATININE 1.22*  --   --   --   --  0.87  --  0.78 0.69 0.79  CALCIUM 8.1*  --   --   --   --  8.3*  --  8.2* 8.0* 8.0*  MG  --   --   --  1.7 1.8 1.9 2.1  --  2.2  --   PHOS  --   --   --  3.6 3.9 4.2 3.4  --   --   --    < > = values in this interval not displayed.   GFR: Estimated Creatinine Clearance: 54.4 mL/min (by C-G formula based on SCr of 0.79 mg/dL). Recent Labs  Lab 12/21/19 1708 12/21/19 1821 12/21/19 1849 12/21/19 2003 12/22/19 0133 12/22/19 0133  12/23/19 0110 12/23/19 0110 12/24/19 0146 12/25/19 0300 12/26/19 0233 12/27/19 0142  PROCALCITON  --  <0.10  --   --  1.19  --  0.98  --   --   --   --   --   WBC   < >  --   --   --  10.6*   < > 15.6*   < > 16.2* 10.9* 10.1 11.1*  LATICACIDVEN  --   --  2.1* 2.5*  --   --   --   --   --   --   --   --    < > = values in this interval not displayed.    Liver Function Tests: Recent Labs  Lab 12/21/19 1708  AST 34  ALT 49*  ALKPHOS 49  BILITOT 0.7  PROT 6.0*  ALBUMIN 2.9*   No results for input(s): LIPASE, AMYLASE in the last 168 hours. No results for input(s): AMMONIA in the last 168 hours.  ABG    Component Value Date/Time   PHART 7.393 12/21/2019 1945   PCO2ART 51.1 (H) 12/21/2019 1945   PO2ART 215 (H) 12/21/2019 1945   HCO3 30.8 (H) 12/21/2019 1945   TCO2 32 12/21/2019 1945   ACIDBASEDEF 1.0 12/21/2019 1711   O2SAT 100.0 12/21/2019 1945     Coagulation Profile: Recent Labs  Lab 12/21/19 1803  INR 1.0    Cardiac Enzymes: No results for input(s): CKTOTAL, CKMB, CKMBINDEX, TROPONINI in the last 168 hours.  HbA1C: HbA1c, POC (controlled diabetic range)  Date/Time Value Ref Range Status  11/28/2019 11:06 AM 6.6 0.0 - 7.0 % Final    Hgb A1c MFr Bld  Date/Time Value Ref Range Status  12/02/2019 05:54 PM 7.1 (H) 4.8 - 5.6 % Final    Comment:    (NOTE) Pre diabetes:          5.7%-6.4%  Diabetes:              >6.4%  Glycemic control for   <7.0% adults with diabetes   07/20/2019 03:56 AM 6.1 (H) 4.8 - 5.6 % Final    Comment:    (NOTE) Pre diabetes:          5.7%-6.4% Diabetes:              >6.4% Glycemic control for   <7.0% adults with diabetes   07/20/2019 03:56 AM 6.0 (H) 4.8 - 5.6 % Final    Comment:    (NOTE) Pre diabetes:          5.7%-6.4% Diabetes:              >6.4% Glycemic control for   <7.0% adults with diabetes     CBG: Recent Labs  Lab 12/26/19 1505 12/26/19 1924 12/27/19 0014 12/27/19 0336 12/27/19 0707  GLUCAP 114* 215* 101* 191* 203*    Critical care time: The patient is critically ill with multiple organ systems failure and requires high complexity decision making for assessment and support, frequent evaluation and titration of therapies, application of advanced monitoring technologies and extensive interpretation of multiple databases.  Critical care time 36 mins. This represents my time independent of the NPs time taking care of the pt. This is excluding procedures.    Audria Nine DO [facility] 12/27/2019, 8:19 AM

## 2019-12-27 NOTE — Anesthesia Preprocedure Evaluation (Signed)
Anesthesia Evaluation  Patient identified by MRN, date of birth, ID band Patient awake    Reviewed: Allergy & Precautions, NPO status , Patient's Chart, lab work & pertinent test results  Airway Mallampati: Intubated  TM Distance: >3 FB     Dental  (+) Dental Advisory Given   Pulmonary  On vent 2/2 subglottic stenosis   breath sounds clear to auscultation       Cardiovascular hypertension, Pt. on medications + dysrhythmias  Rhythm:Regular Rate:Normal     Neuro/Psych CVA    GI/Hepatic Neg liver ROS, GERD  ,  Endo/Other  Hypothyroidism   Renal/GU Renal disease     Musculoskeletal  (+) Arthritis ,   Abdominal   Peds  Hematology negative hematology ROS (+)   Anesthesia Other Findings   Reproductive/Obstetrics                             Anesthesia Physical Anesthesia Plan  ASA: IV  Anesthesia Plan: General   Post-op Pain Management:    Induction: Intravenous  PONV Risk Score and Plan: 3 and Dexamethasone, Ondansetron and Treatment may vary due to age or medical condition  Airway Management Planned: Oral ETT and Tracheostomy  Additional Equipment: None  Intra-op Plan:   Post-operative Plan: Post-operative intubation/ventilation  Informed Consent: I have reviewed the patients History and Physical, chart, labs and discussed the procedure including the risks, benefits and alternatives for the proposed anesthesia with the patient or authorized representative who has indicated his/her understanding and acceptance.     Dental advisory given  Plan Discussed with: CRNA  Anesthesia Plan Comments:         Anesthesia Quick Evaluation

## 2019-12-27 NOTE — Progress Notes (Signed)
ANTICOAGULATION CONSULT NOTE  Pharmacy Consult for Heparin  Indication: atrial fibrillation  No Known Allergies  Patient Measurements: Height: 4\' 11"  (149.9 cm) Weight: 66.7 kg (147 lb 0.8 oz) IBW/kg (Calculated) : 43.2  Vital Signs: Temp: 98.2 F (36.8 C) (08/04 0000) Temp Source: Oral (08/04 0000) BP: 109/78 (08/04 0400) Pulse Rate: 61 (08/04 0400)  Labs: Recent Labs    12/25/19 0300 12/25/19 0300 12/26/19 0233 12/27/19 0142  HGB 10.7*   < > 11.1* 11.3*  HCT 32.9*  --  34.0* 34.7*  PLT 225  --  239 239  HEPARINUNFRC 0.46  --  0.31 0.24*  CREATININE 0.69  --   --  0.79   < > = values in this interval not displayed.    Estimated Creatinine Clearance: 53.6 mL/min (by C-G formula based on SCr of 0.79 mg/dL).  Assessment: 74 y.o. female with Afib for heparin.  Heparin level subtherapeutic this morning  Goal of Therapy:  Heparin level 0.3-0.7 units/ml Monitor platelets by anticoagulation protocol: Yes   Plan:  Increase Heparin 650 units/hr  Phillis Knack, PharmD, BCPS

## 2019-12-27 NOTE — Progress Notes (Signed)
eLink Physician-Brief Progress Note Patient Name: Jamie Mitchell DOB: [birth year] MRN: [id number]   Date of Service  12/27/2019  HPI/Events of Note  Pt has had a hypoglycemic event. Tube feeds are off and plan for Cotrack placement in am  eICU Interventions  Start D5NS  Check q1 glucose x 2 to make sure improvement is stable      Intervention Category Major Interventions: Other:  Margaretmary Lombard 12/27/2019, 11:36 PM

## 2019-12-27 NOTE — Progress Notes (Addendum)
Nutrition Follow-up ° °DOCUMENTATION CODES:  ° °Not applicable ° °INTERVENTION:  ° °Resume tube feeding after tracheostomy (Cortrak to be placed today): °Vital AF 1.2 at 45 ml/h (1080 ml per day) °Prosource TF 45 ml once daily ° °Provides 1336 kcal (1479 kcal total with propofol), 92 gm protein, 876 ml free water daily. ° °NUTRITION DIAGNOSIS:  ° °Inadequate oral intake related to inability to eat as evidenced by NPO status. ° °Ongoing ° °GOAL:  ° °Patient will meet greater than or equal to 90% of their needs  ° °Met with TF ° °MONITOR:  ° °Vent status, TF tolerance, Labs ° °REASON FOR ASSESSMENT:  ° °Ventilator, Consult °Enteral/tube feeding initiation and management ° °ASSESSMENT:  ° °74 yo female admitted with sepsis, acute respiratory failure d/t multifocal PNA. PMH includes subglottic stenosis, excision of benign mucosal uvular lesion 12/11/19, CVA, COPD, A fib, HTN, hypothyroidism.  ° °Discussed patient in ICU rounds and with RN today.  °TF on hold for trach placement today.  °Cortrak to be placed today.  °Current TF order: Vital AF 1.2 via OGT at 45 ml/h (1080 ml/day) with Prosource TF 45 ml once daily to provide 1336 kcals, 92 gm protein, 876 ml free water daily ° °Patient remains intubated on ventilator support °MV: 7.8 L/min °Temp (24hrs), Avg:98.2 °F (36.8 °C), Min:97.6 °F (36.4 °C), Max:98.7 °F (37.1 °C) ° °Propofol: 5.4 ml/hr providing 143 kcal from lipid ° °Labs reviewed.  °CBG: 101-191-203 ° °Medications reviewed and include colace, pepcid, novolog, solumedrol, miralax, propofol. °  °Weight up from 66 kg on admission to 68.8 kg today. °I/O +4 L since admission. °UOP 1050 ml x 24 hours. ° ° °Diet Order:   °Diet Order   ° None  °  ° ° °EDUCATION NEEDS:  ° °No education needs have been identified at this time ° °Skin:  Skin Assessment: Reviewed RN Assessment ° °Last BM:  8/3 type 7 ° °Height:  ° °Ht Readings from Last 1 Encounters:  °12/21/19 4' 11" (1.499 m)  ° ° °Weight:  ° °Wt Readings from Last 1  Encounters:  °12/27/19 68.8 kg  ° ° °Ideal Body Weight:  44.7 kg ° °BMI:  Body mass index is 30.63 kg/m². ° °Estimated Nutritional Needs:  ° °Kcal:  1470 ° °Protein:  90-100 gm ° °Fluid:  >/= 1.5 L ° ° ° °Kimberly H, RD, LDN, CNSC °Please refer to Amion for contact information.                                                       ° °

## 2019-12-27 NOTE — Progress Notes (Signed)
Pt transported to OR ~1100. Pt returned from OR s/p trach ~1200 Order for Cortrak placed per Dr. Ruthann Cancer & team paged.  Cortrak team unable to place, recc fluoroscopy guided placement.

## 2019-12-27 NOTE — Op Note (Signed)
12/27/2019 11:51 AM   PATIENT:  Jamie Mitchell, 74 y.o. female  PRE-OPERATIVE DIAGNOSIS:  AIRWAY DISTRESS  POST-OPERATIVE DIAGNOSIS:  AIRWAY DISTRESS   PROCEDURE:  Procedure(s): TRACHEOSTOMY  SURGEON:  Surgeon(s): Beckie Salts, MD  ASSISTANTS: none   ANESTHESIA:   general  EBL: Minimal   DRAINS: none   LOCAL MEDICATIONS USED:  NONE  COUNTS CORRECT:  YES  PROCEDURE DETAILS: Patient was taken to the operating room and placed on the operating table in the supine position. A shoulder roll was placed for positioning. The patient was previously orally intubated. The neck was prepped and draped in a standard fashion. A vertical incision was created just above the sternal notch using electrocautery. The midline fascia was divided. The isthmus of the thyroid was divided with cautery and a 4-0 silk tie was ised on the left side. The upper trachea was exposed. A tracheotomy was created between the second and third tracheal rings in a horizontal fashion. A lower tracheal flap was created with scissors and the flap was sutured to the cervical skin using 2-0 chromic suture. The orotracheal tube was removed. The #6 Shiley tracheostomy tube was placed without difficulty and the cuff was inflated. The shield was secured to the neck using a Velcro straps and nylon suture. The patient was then transferred back to the intensive care unit in critical condition.  PLAN OF CARE: Transfer to ICU  PATIENT DISPOSITION:  ICU - hemodynamically stable.

## 2019-12-27 NOTE — Progress Notes (Signed)
Cortrak Tube Team Note:  Consult received to place a Cortrak feeding tube.   Unable to advance tube past LES. Recommend fluoroscopy guided nasogastric tube placement.   Koleen Distance MS, RD, LDN Please refer to [facility] for RD and/or RD on-call/weekend/after hours pager

## 2019-12-27 NOTE — Transfer of Care (Signed)
Immediate Anesthesia Transfer of Care Note  Patient: Jamie Mitchell  Procedure(s) Performed: TRACHEOSTOMY (N/A )  Patient Location: PACU  Anesthesia Type:General  Level of Consciousness: sedated and Patient remains intubated per anesthesia plan  Airway & Oxygen Therapy: Patient placed on Ventilator (see vital sign flow sheet for setting)  Post-op Assessment: Report given to RN and Post -op Vital signs reviewed and stable  Post vital signs: Reviewed and stable  Last Vitals:  Vitals Value Taken Time  BP 102/68 12/27/19 1215  Temp    Pulse 60 12/27/19 1218  Resp 20 12/27/19 1218  SpO2 95 % 12/27/19 1218  Vitals shown include unvalidated device data.  Last Pain:  Vitals:   12/27/19 0709  TempSrc: Oral  PainSc:       Patients Stated Pain Goal: 0 (40/34/74 2595)  Complications: No complications documented.

## 2019-12-27 NOTE — Anesthesia Postprocedure Evaluation (Signed)
Anesthesia Post Note  Patient: Jamie Mitchell  Procedure(s) Performed: TRACHEOSTOMY (N/A )     Patient location during evaluation: SICU Anesthesia Type: General Level of consciousness: sedated Pain management: pain level controlled Vital Signs Assessment: post-procedure vital signs reviewed and stable Respiratory status: patient remains intubated per anesthesia plan Cardiovascular status: stable Postop Assessment: no apparent nausea or vomiting Anesthetic complications: no   No complications documented.  Last Vitals:  Vitals:   12/27/19 1000 12/27/19 1101  BP: 106/70   Pulse: (!) 58   Resp: 19   Temp:    SpO2: 100% 100%    Last Pain:  Vitals:   12/27/19 0709  TempSrc: Oral  PainSc:                  Tiajuana Amass

## 2019-12-28 ENCOUNTER — Other Ambulatory Visit: Payer: Self-pay | Admitting: *Deleted

## 2019-12-28 ENCOUNTER — Encounter (HOSPITAL_COMMUNITY): Payer: Self-pay | Admitting: Otolaryngology

## 2019-12-28 LAB — BASIC METABOLIC PANEL
Anion gap: 12 (ref 5–15)
BUN: 21 mg/dL (ref 8–23)
CO2: 21 mmol/L — ABNORMAL LOW (ref 22–32)
Calcium: 7.9 mg/dL — ABNORMAL LOW (ref 8.9–10.3)
Chloride: 105 mmol/L (ref 98–111)
Creatinine, Ser: 0.91 mg/dL (ref 0.44–1.00)
GFR calc Af Amer: >60 mL/min (ref >60)
GFR calc non Af Amer: >60 mL/min (ref >60)
Glucose, Bld: 115 mg/dL — ABNORMAL HIGH (ref 70–99)
Potassium: 4.8 mmol/L (ref 3.5–5.1)
Sodium: 138 mmol/L (ref 135–145)

## 2019-12-28 LAB — GLUCOSE, CAPILLARY
Glucose-Capillary: 120 mg/dL — ABNORMAL HIGH (ref 70–99)
Glucose-Capillary: 126 mg/dL — ABNORMAL HIGH (ref 70–99)
Glucose-Capillary: 142 mg/dL — ABNORMAL HIGH (ref 70–99)
Glucose-Capillary: 148 mg/dL — ABNORMAL HIGH (ref 70–99)
Glucose-Capillary: 172 mg/dL — ABNORMAL HIGH (ref 70–99)
Glucose-Capillary: 213 mg/dL — ABNORMAL HIGH (ref 70–99)
Glucose-Capillary: 294 mg/dL — ABNORMAL HIGH (ref 70–99)
Glucose-Capillary: 306 mg/dL — ABNORMAL HIGH (ref 70–99)
Glucose-Capillary: 79 mg/dL (ref 70–99)

## 2019-12-28 LAB — CBC
HCT: 36.4 % (ref 36.0–46.0)
Hemoglobin: 11.8 g/dL — ABNORMAL LOW (ref 12.0–15.0)
MCH: 29.6 pg (ref 26.0–34.0)
MCHC: 32.4 g/dL (ref 30.0–36.0)
MCV: 91.5 fL (ref 80.0–100.0)
Platelets: 265 10*3/uL (ref 150–400)
RBC: 3.98 MIL/uL (ref 3.87–5.11)
RDW: 17.5 % — ABNORMAL HIGH (ref 11.5–15.5)
WBC: 15.9 10*3/uL — ABNORMAL HIGH (ref 4.0–10.5)
nRBC: 0.1 % (ref 0.0–0.2)

## 2019-12-28 LAB — TRIGLYCERIDES: Triglycerides: 73 mg/dL (ref <150)

## 2019-12-28 MED ORDER — ENSURE ENLIVE PO LIQD
237.0000 mL | Freq: Two times a day (BID) | ORAL | Status: DC
  Administered 2019-12-28 – 2020-01-03 (×9): 237 mL via ORAL

## 2019-12-28 MED ORDER — FAMOTIDINE 20 MG PO TABS
20.0000 mg | ORAL_TABLET | Freq: Two times a day (BID) | ORAL | Status: DC
  Filled 2019-12-28: qty 1

## 2019-12-28 MED ORDER — METHYLPREDNISOLONE SODIUM SUCC 40 MG IJ SOLR
20.0000 mg | Freq: Every day | INTRAMUSCULAR | Status: DC
  Administered 2019-12-28: 20 mg via INTRAVENOUS
  Filled 2019-12-28 (×2): qty 0.5

## 2019-12-28 MED ORDER — POLYETHYLENE GLYCOL 3350 17 G PO PACK
17.0000 g | PACK | Freq: Every day | ORAL | Status: DC
  Administered 2019-12-31 – 2020-01-02 (×3): 17 g via ORAL
  Filled 2019-12-28 (×5): qty 1

## 2019-12-28 MED ORDER — FAMOTIDINE 20 MG PO TABS
20.0000 mg | ORAL_TABLET | Freq: Two times a day (BID) | ORAL | Status: DC
  Administered 2019-12-28 – 2020-01-04 (×15): 20 mg via ORAL
  Filled 2019-12-28 (×14): qty 1

## 2019-12-28 MED ORDER — INSULIN ASPART 100 UNIT/ML ~~LOC~~ SOLN
0.0000 [IU] | Freq: Three times a day (TID) | SUBCUTANEOUS | Status: DC
  Administered 2019-12-28: 15 [IU] via SUBCUTANEOUS
  Administered 2019-12-28 – 2019-12-29 (×2): 3 [IU] via SUBCUTANEOUS
  Administered 2019-12-29 (×2): 7 [IU] via SUBCUTANEOUS
  Administered 2019-12-30 (×2): 4 [IU] via SUBCUTANEOUS
  Administered 2019-12-30 (×2): 3 [IU] via SUBCUTANEOUS
  Administered 2019-12-31: 11 [IU] via SUBCUTANEOUS
  Administered 2019-12-31: 4 [IU] via SUBCUTANEOUS
  Administered 2019-12-31: 15 [IU] via SUBCUTANEOUS
  Administered 2020-01-01: 4 [IU] via SUBCUTANEOUS
  Administered 2020-01-01: 15 [IU] via SUBCUTANEOUS
  Administered 2020-01-01: 11 [IU] via SUBCUTANEOUS
  Administered 2020-01-02: 4 [IU] via SUBCUTANEOUS
  Administered 2020-01-02: 11 [IU] via SUBCUTANEOUS
  Administered 2020-01-02: 20 [IU] via SUBCUTANEOUS
  Administered 2020-01-03: 7 [IU] via SUBCUTANEOUS
  Administered 2020-01-03 (×2): 3 [IU] via SUBCUTANEOUS
  Administered 2020-01-03: 15 [IU] via SUBCUTANEOUS
  Administered 2020-01-04: 3 [IU] via SUBCUTANEOUS
  Administered 2020-01-04: 15 [IU] via SUBCUTANEOUS
  Administered 2020-01-04: 11 [IU] via SUBCUTANEOUS

## 2019-12-28 MED ORDER — LEVOTHYROXINE SODIUM 88 MCG PO TABS
88.0000 ug | ORAL_TABLET | Freq: Every day | ORAL | Status: DC
  Administered 2019-12-29 – 2020-01-04 (×7): 88 ug via ORAL
  Filled 2019-12-28 (×7): qty 1

## 2019-12-28 MED ORDER — ORAL CARE MOUTH RINSE
15.0000 mL | Freq: Two times a day (BID) | OROMUCOSAL | Status: DC
  Administered 2019-12-29 – 2020-01-04 (×9): 15 mL via OROMUCOSAL

## 2019-12-28 MED ORDER — CHLORHEXIDINE GLUCONATE 0.12 % MT SOLN
15.0000 mL | Freq: Two times a day (BID) | OROMUCOSAL | Status: DC
  Administered 2019-12-28 – 2020-01-04 (×15): 15 mL via OROMUCOSAL
  Filled 2019-12-28 (×11): qty 15

## 2019-12-28 MED ORDER — DOCUSATE SODIUM 50 MG/5ML PO LIQD
100.0000 mg | Freq: Two times a day (BID) | ORAL | Status: DC
  Administered 2019-12-28 – 2020-01-01 (×6): 100 mg via ORAL
  Filled 2019-12-28 (×7): qty 10

## 2019-12-28 MED ORDER — FAMOTIDINE 20 MG PO TABS: 20.0000 mg | ORAL_TABLET | Freq: Two times a day (BID) | ORAL | Status: DC

## 2019-12-28 MED ORDER — ACETAMINOPHEN 325 MG PO TABS: 650.0000 mg | ORAL_TABLET | Freq: Four times a day (QID) | ORAL | Status: DC | PRN

## 2019-12-28 MED ORDER — ATORVASTATIN CALCIUM 40 MG PO TABS
40.0000 mg | ORAL_TABLET | Freq: Every evening | ORAL | Status: DC
  Administered 2019-12-28 – 2020-01-04 (×8): 40 mg via ORAL
  Filled 2019-12-28 (×6): qty 1

## 2019-12-28 MED ORDER — POLYETHYLENE GLYCOL 3350 17 G PO PACK: 17.0000 g | PACK | Freq: Every day | ORAL | Status: DC | PRN

## 2019-12-28 NOTE — Progress Notes (Signed)
12/28/19 1300  SLP Visit Information  SLP Received On 12/28/19  General Information  HPI Pt is a 74 year old woman suffers from recurrent stridor and subglottic edema/stinosis presents with acute hypoxic respiratory failure resulting in intubation by EMS. Trach placed by ENT on 8/4.  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  During the hospitalization she underwent IR thrombectomy for which she was intubated for duration of 3 days.  After leaving the hospital her son reports she had dysphonia but no stridor. She was readmitted 0n 10/31/2019 with stridor, ENT exam showed subglottic swelling, confirmed by CT, discharged with a steriod taper. Was reporting delayed regurgitation of PO at that time, esophagram showed Gastroesophageal reflux andBorderline appearance for nonspecific esophageal dysmotility, given the presence of proximal escape and initial secondary and tertiary contractions. Readmitted on 6/25 with similar symptoms, MBS at that time showed Mild oral dysphgia with delayed transit of boluses "No reflux or retrograde movement of boluses observed, however patient did exhibit s/s of some SOB/gasping after successive PO's, as well as instances of belching/gagging but without regurgitation." Primary complaint has been globus. Pt was tolerating mech soft diet at the time of d/c.   Additional Tracheostomy Tube Assessment  Trach Collar Period waking hours  Secretion Description bloody thin  Level of Secretion Expectoration Tracheal  Ventilator Dependency  Vent Dependent No  Cuff Deflation Trial  Tolerated Cuff Deflation Yes  Length of Time for Cuff Deflation Trial all waking hours  Behavior Alert  PMSV Trial  PMSV was placed for 5 minutes  Able to redirect subglottic air through upper airway Yes  Able to Attain Phonation Yes  Voice Quality Low vocal intensity  Able to Expectorate Secretions Yes  Level of Secretion Expectoration with PMSV Oral;Tracheal  Breath Support  for Phonation Moderately decreased  Intelligibility Intelligibility reduced  Word 25-49% accurate  Phrase Not tested  Sentence Not tested  Conversation Not tested  Respirations During Trial 30  SpO2 During Trial 99 %  SLP - End of Session  Patient left in bed;with family/visitor present  Nurse Communication PMSV use/precautions;Swallow strategies reviewed;Treatment plan  Assessment  Clinical Impression Statement (ACUTE ONLY) Pt demonstrates brief tolerance of PMSV placed on trach with deflated cuff in the setting of subglottic edema. Pt is able to tracheally expectorate secretions with hard volitional coughing. With valve placemetn she can achieve audible sustained phonation and word level speech though articulation is at times impaired (baseline aphasia/apraxia). Pt was able to repeat words, state her name. After several minutes pts vital remained stable, RR a little up to high 20s, but pt restless and relieved with PMSV removal. Verbalized yes that it was hard to breathe. Suspect incomplete upper airway patency for prolonged PMSV use. Likely a cuffless and or 4 Shiley will improve airflow when pt is ready for a trach change. Pt may wear PMSV for up to 2 minutes with full staff supervision. Discussed stirct precautions and reasoning with pts son at bedside.   SLP Visit Diagnosis Aphonia (R49.1)  SLP Recommendation/Assessment Patient needs continued Speech Lanaguage Pathology Services  PMSV - Therapy Diagnosis Dyphonia  Plan  Speech Therapy Frequency (ACUTE ONLY) min 2x/week  Duration 2 weeks  Treatment/Interventions Patient/family education;Compensatory strategies;SLP instruction and feedback;Cueing hierarchy  Potential to Achieve Goals (ACUTE ONLY) Good  Potential Considerations (ACUTE ONLY) Co-morbidities;Previous level of function  SLP Recommendations  Patient may use Passy-Muir Speech Valve Intermittently with supervision  PMSV Supervision Full  MD: Please consider changing  trach tube  to  Smaller size;Cuffless  Follow up Recommendations 24 hour supervision/assistance  Individuals Consulted  Consulted and Agree with Results and Recommendations Patient;Family member/caregiver  SLP Time Calculation  SLP Start Time (ACUTE ONLY) 1000  SLP Stop Time (ACUTE ONLY) 1034  SLP Time Calculation (min) (ACUTE ONLY) 34 min  SLP Evaluations  $ SLP Speech Visit 1 Visit  SLP Evaluations  $ SLP Eval Voice Prosthetic Device Procedure  $$ Passy Muir Speaking Valve yes

## 2019-12-28 NOTE — Patient Outreach (Signed)
[facility]) Care Management  12/28/2019  Jamie Mitchell Jul 02, 1945 [id number]   Received [place] / Epic in-basket message, patient hospitalized on 12/21/2019 for Acute respiratory failure with hypoxia and remains inpatient.   RNCM sent the following in-basket message to [place] KitchenNatividad Brood and [place]):  Gilles Chiquito,   patient hospitalized on 12/21/2019 for Acute respiratory failure with hypoxia and remains inpatient, please follow up for discharge disposition/ discharge planning.   RNCM will follow post discharge if patient hospitalized for less than 10 days or will close case if patient hospitalized greater than 10 days.  No telephonic outreach at this time due to hospitalization.  Patient's case will be discussed at 01/05/2020 Multidisciplinary Case Discussion with [facility] Care Management team.     Colbert Coyer. Annia Friendly, BSN, [place] [facility] Telephonic CM Phone: [phone] Fax: [phone]

## 2019-12-28 NOTE — Progress Notes (Addendum)
NAME:  Jamie Mitchell, MRN:  [id number], DOB:  04/15/1946, LOS: 7 ADMISSION DATE:  12/21/2019, CONSULTATION DATE:  12/21/2019 REFERRING MD:  Dr. Ralene Bathe, CHIEF COMPLAINT:  Acute respiratory failure   Brief History   74yo female with known history of recurrent stridor and subglottic edema/stinosis presents with acute hypoxic respiratory failure resulting in intubation by EMS. PCCM consulted for further management and admission.   History of present illness   Jamie Mitchell is a 74yo female with PMX significant for thyroid disease, stroke, hypertension, chronic mastoiditis, and atrial fibrillation who presented to ED with acute hypoxic respiratory failure. Per EMS they were intimally called for complaints of shortness of breath, on arrival patient was found with agonal breathing and significant hypoxia with SPO2 of 60% on RA. Patient intubated and started on solumedrol.  CXR showed bilateral opacities, patient started on azithromycin and ceftriaxone with blood, sputum, and urine cultures collected.   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 During this admission she was seen by ENT and PCCM and treated with BIPAP and steroids.   Past Medical History  Thyroid disease Stroke Hypertension Chronic mastoiditis A-fib  Significant Hospital Events   Admitted with acute hypoxic respiratory failure, intubated per EMS  Consults:  ENT  speech Procedures:  ETT 7/29 >8/4 Trach 8/4  Significant Diagnostic Tests:  CT head 7/29 > No CT evidence of acute intracranial abnormality. Redemonstrated large chronic left frontal lobe infarct. 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 Mild ethmoid and left maxillary sinus mucosal thickening. Large left middle ear/mastoid effusion. PFT 12/19/2019 ratio 69% FEV1 80% FVC 139% DLCO 118%  CXR 07/30 > Bilateral  pulmonary interstitial prominence again noted. Bilateral pulmonary alveolar infiltrates again noted. Interim improvement from prior exam. Low lung volumes with bibasilar atelectasis.  Micro Data:  Sars2 7/29: negative Blood 7/29 > ngtd Sputum 7/29 > Urine 7/29 > ngtd  Antimicrobials:  Azithro 7/29 >>8/2 Rocephin 7/29 >>  Cefepime 7/29 >stopped  Vanc 7/29>stopped    Interim history/subjective:  8/5: doing well. Placed on cpap will move to atc later today. Denies pain. Consulted speech for pmv and swallow as soon as possible. Otherwise will need fluoro for cortrak 8/4: doing well remains intubated, for trach later today. On heparin but RN aware of stop time for procedure. Tf held. Pt is comfortable on vent at this time.  8/3: remains intubated. Following up with previous attending to see whether [facility] reached back out to determine plan. Agree with extubation not being ideal without eval. Awaiting call back from [facility] ENT to explain situation. Pt is alert and awake and following commands. Son is frustrated with delays as expected.    Objective   Blood pressure 120/80, pulse (!) 51, temperature 98.5 F (36.9 C), temperature source Oral, resp. rate (!) 22, height 4\' 11"  (1.499 m), weight 64.7 kg, SpO2 99 %.    Vent Mode: PRVC FiO2 (%):  [40 %] 40 % Set Rate:  [20 bmp] 20 bmp Vt Set:  [340 mL] 340 mL PEEP:  [5 cmH20] 5 cmH20 Pressure Support:  [8 cmH20] 8 cmH20 Plateau Pressure:  [11 cmH20-13 cmH20] 11 cmH20   Intake/Output Summary (Last 24 hours) at 12/28/2019 0809 Last data filed at 12/28/2019 0600 Gross per 24 hour  Intake 795.02 ml  Output 2150 ml  Net -1354.98 ml   Filed Weights   12/26/19 0416 12/27/19 0500 12/28/19 0400  Weight: 66.7 kg  68.8 kg 64.7 kg    Examination: General: Elderly woman, no distress, on cpap 5/5 and tolerating well HENT: NCAT, trach in place,  pupils equal Lungs: faint rhonchi in bilateral lungs anteriorly, no wheezing  Cardiovascular: Regular, no  murmur Abdomen: Nondistended, positive bowel sounds Extremities: No edema Neuro: Alert, awake, nods to questions, moves all extremities  Resolved Hospital Problem list     Assessment & Plan:  Acute respiratory failure with hypoxia, hypercarbia, requiring mechanical ventilation 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  -will need cortrak with fluoro if unable to do so (unable to pass cortrak at bedside 8/4) -s/p empiric abx (completed azithro)   Hx Subglottic stenosis and glottic edema, PFTs as above.  -s/p trach 8/5 -weaning solumedrol -Will need an immunology evaluation as an outpatient   Prior L MCA embolic CVA: residual dysphagia, known aspiration Supportive care SLP evaluation pending atc trials  Afib: previously on amio, eliquis-- since discontinued with concern that this may have contributed to prior angioedema. Now on full dose lovenox BID at home P Resume lovenox  Chronic normocytic anemia:  -stable, follow  DM2 with hyperglycemia:  - exacerbated by steroids... off in 2 days Sliding-scale insulin as ordered -tf held 2/2 po access  GERD Pepcid -esophageal dysmotility and small hiatal hernia found on esophogram back in June. Certain this plus reflux can exacerbate inflammation of vocal cords - speech recommended continuing h2 blocker and f/u with GI -they have requested referral to Dr Bryan Lemma after d/c   Best practice:  Diet: NPO  Pain/Anxiety/Delirium protocol (if indicated):  PRN fent  VAP protocol (if indicated): yes DVT prophylaxis: SCDs GI prophylaxis: pepcid  Glucose control: SSI  Mobility: BR Code Status: Full  Family Communication: Updated son at bedside 8/5 Disposition: ICU   Labs   CBC: Recent Labs    Lab 12/24/19 0146 12/25/19 0300 12/26/19 0233 12/27/19 0142 12/28/19 0406  WBC 16.2* 10.9* 10.1 11.1* 15.9*  HGB 10.8* 10.7* 11.1* 11.3* 11.8*  HCT 33.1* 32.9* 34.0* 34.7* 36.4  MCV 88.7 88.0 87.9 88.1 91.5  PLT 211 225 239 239 350    Basic Metabolic Panel: Recent Labs  Lab 12/21/19 1708 12/22/19 1414 12/22/19 1648 12/23/19 0110 12/23/19 1616 12/24/19 0146 12/25/19 0300 12/27/19 0142 12/28/19 0406  NA   < >  --   --  135  --  136 137 138 138  K   < >  --   --  4.1  --  3.7 3.9 3.6 4.8  CL   < >  --   --  101  --  103 103 103 105  CO2   < >  --   --  26  --  25 26 27  21*  GLUCOSE   < >  --   --  204*  --  263* 201* 159* 115*  BUN   < >  --   --  16  --  21 22 26* 21  CREATININE   < >  --   --  0.87  --  0.78 0.69 0.79 0.91  CALCIUM   < >  --   --  8.3*  --  8.2* 8.0* 8.0* 7.9*  MG  --  1.7 1.8 1.9 2.1  --  2.2  --   --  PHOS  --  3.6 3.9 4.2 3.4  --   --   --   --    < > = values in this interval not displayed.   GFR: Estimated Creatinine Clearance: 46.4 mL/min (by C-G formula based on SCr of 0.91 mg/dL). Recent Labs  Lab 12/21/19 1708 12/21/19 1821 12/21/19 1849 12/21/19 2003 12/22/19 0133 12/22/19 0133 12/23/19 0110 12/24/19 0146 12/25/19 0300 12/26/19 0233 12/27/19 0142 12/28/19 0406  PROCALCITON  --  <0.10  --   --  1.19  --  0.98  --   --   --   --   --   WBC   < >  --   --   --  10.6*   < > 15.6*   < > 10.9* 10.1 11.1* 15.9*  LATICACIDVEN  --   --  2.1* 2.5*  --   --   --   --   --   --   --   --    < > = values in this interval not displayed.    Liver Function Tests: Recent Labs  Lab 12/21/19 1708  AST 34  ALT 49*  ALKPHOS 49  BILITOT 0.7  PROT 6.0*  ALBUMIN 2.9*   No results for input(s): LIPASE, AMYLASE in the last 168 hours. No results for input(s): AMMONIA in the last 168 hours.  ABG    Component Value Date/Time   PHART 7.393 12/21/2019 1945   PCO2ART 51.1 (H) 12/21/2019 1945   PO2ART 215 (H) 12/21/2019 1945   HCO3 30.8 (H)  12/21/2019 1945   TCO2 32 12/21/2019 1945   ACIDBASEDEF 1.0 12/21/2019 1711   O2SAT 100.0 12/21/2019 1945     Coagulation Profile: Recent Labs  Lab 12/21/19 1803  INR 1.0    Cardiac Enzymes: No results for input(s): CKTOTAL, CKMB, CKMBINDEX, TROPONINI in the last 168 hours.  HbA1C: HbA1c, POC (controlled diabetic range)  Date/Time Value Ref Range Status  11/28/2019 11:06 AM 6.6 0.0 - 7.0 % Final   Hgb A1c MFr Bld  Date/Time Value Ref Range Status  12/02/2019 05:54 PM 7.1 (H) 4.8 - 5.6 % Final    Comment:    (NOTE) Pre diabetes:          5.7%-6.4%  Diabetes:              >6.4%  Glycemic control for   <7.0% adults with diabetes   07/20/2019 03:56 AM 6.1 (H) 4.8 - 5.6 % Final    Comment:    (NOTE) Pre diabetes:          5.7%-6.4% Diabetes:              >6.4% Glycemic control for   <7.0% adults with diabetes   07/20/2019 03:56 AM 6.0 (H) 4.8 - 5.6 % Final    Comment:    (NOTE) Pre diabetes:          5.7%-6.4% Diabetes:              >6.4% Glycemic control for   <7.0% adults with diabetes     CBG: Recent Labs  Lab 12/27/19 2340 12/28/19 0047 12/28/19 0221 12/28/19 0304 12/28/19 0755  GLUCAP 197* 148* 126* 120* 172*    Critical care time: The patient is critically ill with multiple organ systems failure and requires high complexity decision making for assessment and support, frequent evaluation and titration of therapies, application of advanced monitoring technologies and extensive interpretation of multiple databases.  Critical care time 66  mins. This represents my time independent of the NPs time taking care of the pt. This is excluding procedures.    [facility] 12/28/2019, 8:09 AM

## 2019-12-28 NOTE — Progress Notes (Signed)
eLink Physician-Brief Progress Note Patient Name: Jamie Mitchell DOB: 26-Dec-1945 MRN: [id number]   Date of Service  12/28/2019  HPI/Events of Note  AM labs requested  eICU Interventions  Ordered     Intervention Category Minor Interventions: Clinical assessment - ordering diagnostic tests  Margaretmary Lombard 12/28/2019, 2:10 AM

## 2019-12-28 NOTE — Evaluation (Signed)
Clinical/Bedside Swallow Evaluation Patient Details  Name: Jamie Mitchell MRN: [id number] Date of Birth: 1946/04/06  Today's Date: 12/28/2019 Time: SLP Start Time (ACUTE ONLY): 1000 SLP Stop Time (ACUTE ONLY): 1034 SLP Time Calculation (min) (ACUTE ONLY): 34 min  Past Medical History:  Past Medical History:  Diagnosis Date  . Atrial fibrillation ([place])   . 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 Hypertension   . Stroke ([place])   . Thyroid disease    Past Surgical History:  Past Surgical History:  Procedure Laterality Date  . IR CT HEAD LTD  07/20/2019  . IR PERCUTANEOUS ART THROMBECTOMY/INFUSION INTRACRANIAL INC DIAG ANGIO  07/20/2019  . RADIOLOGY WITH ANESTHESIA N/A 07/20/2019   Procedure: IR WITH ANESTHESIA;  Surgeon: Luanne Bras, MD;  Location: [facility];  Service: Radiology;  Laterality: N/A;  . TRACHEOSTOMY TUBE PLACEMENT N/A 12/27/2019   Procedure: TRACHEOSTOMY;  Surgeon: Izora Gala, MD;  [facility] OR;  Service: ENT;  Laterality: N/A;   HPI:  Pt is a 73 year old woman suffers from recurrent stridor and subglottic edema/stinosis presents with acute hypoxic respiratory failure resulting in intubation by EMS. Trach placed by ENT on 8/4.  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  During the hospitalization she underwent IR thrombectomy for which she was intubated for duration of 3 days.  After leaving the hospital her son reports she had dysphonia but no stridor. She was readmitted 0n 10/31/2019 with stridor, ENT exam showed subglottic swelling, confirmed by CT, discharged with a steriod taper. Was reporting delayed regurgitation of PO at that time, esophagram showed Gastroesophageal reflux andBorderline appearance for nonspecific esophageal dysmotility, given the presence of proximal escape and initial secondary and tertiary contractions. Readmitted on 6/25 with similar symptoms, MBS at that time showed Mild oral dysphgia with delayed transit of boluses "No  reflux or retrograde movement of boluses observed, however patient did exhibit s/s of some SOB/gasping after successive PO's, as well as instances of belching/gagging but without regurgitation." Primary complaint has been globus. Pt was tolerating mech soft diet at the time of d/c.    Assessment / Plan / Recommendation Clinical Impression  Pt demonstrates no overt signs of aspiration with consecutive straw sips of thin liquids, and no difficulty with solids or puree. Pt is unalbe to tolerate PMSV for prolonged periods, so Trials also given with PMSV off, with no negative consequence observed. She has good cough ability to clear airway as needed when secretions present. Risk of silent aspiration relatively low. She has a history of a primary esophageal dysphagia that we reviewed during session. Pt is at high risk of laryngeal pharyngeal reflux given dx GER on esopahgram, dysmotility and report of lgobus and regurgitation in prior visits. LPR can result in laryngeal edema. Recommend f/u with GI given this history that has not been addressed. We started some basic reflux precautions, but will need f/u. Pt may intiate a mechanical soft diet with thin liquids in the meantime.  SLP Visit Diagnosis: Dysphagia, unspecified (R13.10)    Aspiration Risk  Moderate aspiration risk    Diet Recommendation     Medication Administration: Whole meds with puree Supervision: Patient able to self feed Compensations: Slow rate;Small sips/bites Postural Changes: Seated upright at 90 degrees    Other  Recommendations Recommended Consults: Consider GI evaluation   Follow up Recommendations 24 hour supervision/assistance      Frequency and Duration min 2x/week          Prognosis  Swallow Study   General HPI: Pt is a 74 year old woman suffers from recurrent stridor and subglottic edema/stinosis presents with acute hypoxic respiratory failure resulting in intubation by EMS. Trach placed by ENT on 8/4.  Pt  has a history of L MCA CVA in in 06/2018 with resulting aphasia.  During the hospitalization she underwent IR thrombectomy for which she was intubated for duration of 3 days.  After leaving the hospital her son reports she had dysphonia but no stridor. She was readmitted 0n 10/31/2019 with stridor, ENT exam showed subglottic swelling, confirmed by CT, discharged with a steriod taper. Was reporting delayed regurgitation of PO at that time, esophagram showed Gastroesophageal reflux andBorderline appearance for nonspecific esophageal dysmotility, given the presence of proximal escape and initial secondary and tertiary contractions. Readmitted on 6/25 with similar symptoms, MBS at that time showed Mild oral dysphgia with delayed transit of boluses "No reflux or retrograde movement of boluses observed, however patient did exhibit s/s of some SOB/gasping after successive PO's, as well as instances of belching/gagging but without regurgitation." Primary complaint has been globus. Pt was tolerating mech soft diet at the time of d/c.  Type of Study: Bedside Swallow Evaluation Diet Prior to this Study: NPO Temperature Spikes Noted: No Respiratory Status: Trach Collar History of Recent Intubation: Yes Length of Intubations (days): 7 days Date extubated: 12/27/19 Behavior/Cognition: Alert;Cooperative;Pleasant mood Self-Feeding Abilities: Able to feed self Patient Positioning: Upright in bed Baseline Vocal Quality: Low vocal intensity Volitional Cough: Strong Volitional Swallow: Able to elicit    Oral/Motor/Sensory Function Overall Oral Motor/Sensory Function: Within functional limits   Ice Chips     Thin Liquid Thin Liquid: Within functional limits Presentation: Straw    Nectar Thick Nectar Thick Liquid: Not tested   Honey Thick Honey Thick Liquid: Not tested   Puree Puree: Within functional limits Presentation: Self Fed   Solid     Solid: Within functional limits      Keron Koffman, Katherene Ponto 12/28/2019,1:52 PM

## 2019-12-28 NOTE — Progress Notes (Signed)
Changed pt to 5/5 cpap this am with adequate tv

## 2019-12-29 LAB — BASIC METABOLIC PANEL
Anion gap: 11 (ref 5–15)
BUN: 15 mg/dL (ref 8–23)
CO2: 24 mmol/L (ref 22–32)
Calcium: 8.4 mg/dL — ABNORMAL LOW (ref 8.9–10.3)
Chloride: 101 mmol/L (ref 98–111)
Creatinine, Ser: 0.74 mg/dL (ref 0.44–1.00)
GFR calc Af Amer: >60 mL/min (ref >60)
GFR calc non Af Amer: >60 mL/min (ref >60)
Glucose, Bld: 128 mg/dL — ABNORMAL HIGH (ref 70–99)
Potassium: 3.9 mmol/L (ref 3.5–5.1)
Sodium: 136 mmol/L (ref 135–145)

## 2019-12-29 LAB — CBC
HCT: 37.6 % (ref 36.0–46.0)
Hemoglobin: 12.3 g/dL (ref 12.0–15.0)
MCH: 28.7 pg (ref 26.0–34.0)
MCHC: 32.7 g/dL (ref 30.0–36.0)
MCV: 87.9 fL (ref 80.0–100.0)
Platelets: 257 10*3/uL (ref 150–400)
RBC: 4.28 MIL/uL (ref 3.87–5.11)
RDW: 17.4 % — ABNORMAL HIGH (ref 11.5–15.5)
WBC: 11.9 10*3/uL — ABNORMAL HIGH (ref 4.0–10.5)
nRBC: 0 % (ref 0.0–0.2)

## 2019-12-29 LAB — GLUCOSE, CAPILLARY
Glucose-Capillary: 136 mg/dL — ABNORMAL HIGH (ref 70–99)
Glucose-Capillary: 234 mg/dL — ABNORMAL HIGH (ref 70–99)
Glucose-Capillary: 216 mg/dL — ABNORMAL HIGH (ref 70–99)
Glucose-Capillary: 161 mg/dL — ABNORMAL HIGH (ref 70–99)
Glucose-Capillary: 145 mg/dL — ABNORMAL HIGH (ref 70–99)

## 2019-12-29 MED ORDER — APIXABAN 5 MG PO TABS
5.0000 mg | ORAL_TABLET | Freq: Two times a day (BID) | ORAL | Status: DC
  Administered 2019-12-29 – 2020-01-04 (×13): 5 mg via ORAL
  Filled 2019-12-29 (×13): qty 1

## 2019-12-29 MED ORDER — AMIODARONE LOAD VIA INFUSION
150.0000 mg | Freq: Once | INTRAVENOUS | Status: AC
  Administered 2019-12-29: 150 mg via INTRAVENOUS
  Filled 2019-12-29: qty 83.34

## 2019-12-29 MED ORDER — SODIUM CHLORIDE 0.9 % IV BOLUS
500.0000 mL | Freq: Once | INTRAVENOUS | Status: AC
  Administered 2019-12-29: 500 mL via INTRAVENOUS

## 2019-12-29 MED ORDER — AMIODARONE LOAD VIA INFUSION: 150.0000 mg | Freq: Once | INTRAVENOUS | Status: DC

## 2019-12-29 MED ORDER — AMIODARONE HCL IN DEXTROSE 360-4.14 MG/200ML-% IV SOLN: 30.0000 mg/h | INTRAVENOUS | Status: DC

## 2019-12-29 MED ORDER — AMIODARONE IV BOLUS ONLY 150 MG/100ML
150.0000 mg | Freq: Once | INTRAVENOUS | Status: DC
  Filled 2019-12-29: qty 100

## 2019-12-29 MED ORDER — AMIODARONE HCL 200 MG PO TABS
200.0000 mg | ORAL_TABLET | Freq: Every day | ORAL | Status: DC
  Administered 2019-12-29 – 2020-01-04 (×7): 200 mg via ORAL
  Filled 2019-12-29 (×7): qty 1

## 2019-12-29 MED ORDER — MIDODRINE HCL 5 MG PO TABS
10.0000 mg | ORAL_TABLET | Freq: Three times a day (TID) | ORAL | Status: DC
  Administered 2019-12-29 – 2020-01-01 (×10): 10 mg via ORAL
  Filled 2019-12-29 (×10): qty 2

## 2019-12-29 MED ORDER — AMIODARONE HCL IN DEXTROSE 360-4.14 MG/200ML-% IV SOLN
60.0000 mg/h | INTRAVENOUS | Status: DC
  Administered 2019-12-29 (×2): 60 mg/h via INTRAVENOUS
  Filled 2019-12-29 (×2): qty 200

## 2019-12-29 NOTE — Plan of Care (Signed)
  Problem: Activity: Goal: Risk for activity intolerance will decrease Outcome: Progressing Note: Pt ambulated to chair this morning with two assist. Tolerated well.    Problem: Nutrition: Goal: Adequate nutrition will be maintained Outcome: Progressing Note: Pt is tolerating mechanical soft diet well.   Problem: Elimination: Goal: Will not experience complications related to bowel motility Outcome: Progressing Goal: Will not experience complications related to urinary retention Outcome: Progressing   Problem: Pain Managment: Goal: General experience of comfort will improve Outcome: Progressing   Problem: Safety: Goal: Ability to remain free from injury will improve Outcome: Progressing   Problem: Skin Integrity: Goal: Risk for impaired skin integrity will decrease Outcome: Progressing

## 2019-12-29 NOTE — Progress Notes (Signed)
Amiodarone drip infiltrated into patients right anterior forearm. Pharmacy and MD made aware. Pharmacy recommendation was a cool compress. RN placed ice pack on arm and will continue to monitor.

## 2019-12-29 NOTE — Progress Notes (Signed)
Nutrition Follow-up  DOCUMENTATION CODES:   Not applicable  INTERVENTION:   - Ensure Enlive po BID, each supplement provides 350 kcal and 20 grams of protein  - Magic cup TID with meals, each supplement provides 290 kcal and 9 grams of protein  NUTRITION DIAGNOSIS:   Inadequate oral intake related to inability to eat as evidenced by NPO status.  Progressing, pt now on dysphagia 3 diet  GOAL:   Patient will meet greater than or equal to 90% of their needs  Progressing  MONITOR:   PO intake, Supplement acceptance, Diet advancement, Labs, Weight trends, Skin  REASON FOR ASSESSMENT:   Ventilator, Consult Enteral/tube feeding initiation and management  ASSESSMENT:   74 yo female admitted with sepsis, acute respiratory failure d/t multifocal PNA. PMH includes subglottic stenosis, excision of benign mucosal uvular lesion 12/11/19, CVA, COPD, A fib, HTN, hypothyroidism.  8/04 - trach 8/05 - ATC, diet advanced to dysphagia 3  Discussed pt with RN and during ICU rounds. Pt eating well and tolerating dysphagia 3 diet without difficulty.  Weight down a total of 10 lbs since admit. RD will order oral nutrition supplements to aid pt in meeting kcal and protein needs. Discussed this plan with pt and family yesterday.  Meal Completion: 90%  Medications reviewed and include: colace, pepcid, Ensure Enlive BID, SSI, miralax, amiodarone gtt  Labs reviewed. CBG's: 79-306 x 24 hours  UOP: 1050 ml x 24 hours I/O's: +2.2 L since admit  Diet Order:   Diet Order            DIET DYS 3 Room service appropriate? Yes; Fluid consistency: Thin  Diet effective now                 EDUCATION NEEDS:   No education needs have been identified at this time  Skin:  Skin Assessment: Skin Integrity Issues: Incisions: neck  Last BM:  12/29/19 type 6  Height:   Ht Readings from Last 1 Encounters:  12/21/19 4\' 11"  (1.499 m)    Weight:   Wt Readings from Last 1 Encounters:  12/29/19  61.5 kg    Ideal Body Weight:  44.7 kg  BMI:  Body mass index is 27.38 kg/m.  Estimated Nutritional Needs:   Kcal:  1600-1800  Protein:  80-95 grams  Fluid:  >/= 1.5 L    Gaynell Face, MS, RD, LDN Inpatient Clinical Dietitian Please see [facility] for contact information.

## 2019-12-29 NOTE — Progress Notes (Addendum)
ANTICOAGULATION CONSULT NOTE - Initial Consult  Pharmacy Consult for Eliquis Indication: atrial fibrillation  No Known Allergies  Patient Measurements: Height: 4\' 11"  (149.9 cm) Weight: 61.5 kg (135 lb 9.3 oz) IBW/kg (Calculated) : 43.2  Vital Signs: Temp: 98.8 F (37.1 C) (08/06 0700) Temp Source: Oral (08/06 0700) BP: 108/77 (08/06 0600) Pulse Rate: 91 (08/06 0600)  Labs: Recent Labs    12/27/19 0142 12/27/19 0142 12/28/19 0406 12/29/19 0330  HGB 11.3*   < > 11.8* 12.3  HCT 34.7*  --  36.4 37.6  PLT 239  --  265 257  HEPARINUNFRC 0.24*  --   --   --   CREATININE 0.79  --  0.91 0.74   < > = values in this interval not displayed.    Estimated Creatinine Clearance: 51.4 mL/min (by C-G formula based on SCr of 0.74 mg/dL).   Medical History: Past Medical History:  Diagnosis Date  . Atrial fibrillation ([place])   . 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 Hypertension   . Stroke ([place])   . Thyroid disease     Medications:  Infusions:  . sodium chloride Stopped (12/25/19 1942)  . amiodarone 60 mg/hr (12/29/19 0600)   Followed by  . amiodarone      Assessment: 5 yoF with afib presenting with acute hypoxic respiratory failure, s/p trach placement. Patient was on amiodarone and Eliquis in the past but previous admission d/cd both d/t angioedema possibly from Eliquis and amiodarone.   Upon further eval, CCM feels swelling was more likely r/t chronic glottis edema vs true allergy. Also not listed as allergy in chart. Patient was maintained on heparin gtt then switched to lovenox. Now restarting Eliquis and amiodarone because patient is in afib again.  Previous dose of Eliquis was 5mg  BID, which is still appropriate since patient is >60kg, <80y/o and SCr <1.0. H/H stable, no s/sx bleeding.   Goal of Therapy:  Monitor platelets by anticoagulation protocol: Yes   Plan:  D/c lovenox  Resume previous dose of apixaban 5mg  BID  Monitor H/H, clinical progress  Mercy Riding, PharmD PGY1 Acute Care Pharmacy Resident Please refer to [facility] for unit-specific pharmacist

## 2019-12-29 NOTE — Progress Notes (Signed)
eLink Physician-Brief Progress Note Patient Name: Jamie Mitchell DOB: March 30, 1946 MRN: [id number]   Date of Service  12/29/2019  HPI/Events of Note  Afib with RVR and rate 120-130, patient was actually on Amiodarone and Elquis at home.  eICU Interventions  Amiodarone bolus and infusion ordered.        Kerry Kass Yoona Ishii 12/29/2019, 4:31 AM

## 2019-12-29 NOTE — Progress Notes (Signed)
NAME:  Calia Napp, MRN:  [id number], DOB:  Nov 08, 1945, LOS: 8 ADMISSION DATE:  12/21/2019, CONSULTATION DATE:  12/21/2019 REFERRING MD:  Dr. Ralene Bathe, CHIEF COMPLAINT:  Acute respiratory failure   Brief History   74yo female with known history of recurrent stridor and subglottic edema/stinosis presents with acute hypoxic respiratory failure resulting in intubation by EMS. PCCM consulted for further management and admission.   History of present illness   Laquia [name] is a 74yo female with PMX significant for thyroid disease, stroke, hypertension, chronic mastoiditis, and atrial fibrillation who presented to ED with acute hypoxic respiratory failure. Per EMS they were intimally called for complaints of shortness of breath, on arrival patient was found with agonal breathing and significant hypoxia with SPO2 of 60% on RA. Patient intubated and started on solumedrol.  CXR showed bilateral opacities, patient started on azithromycin and ceftriaxone with blood, sputum, and urine cultures collected.   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 During this admission she was seen by ENT and PCCM and treated with BIPAP and steroids.   Past Medical History  Thyroid disease Stroke Hypertension Chronic mastoiditis A-fib  Significant Hospital Events   Admitted with acute hypoxic respiratory failure, intubated per EMS  Consults:  ENT  speech Procedures:  ETT 7/29 >8/4 Trach 8/4  Significant Diagnostic Tests:  CT head 7/29 > No CT evidence of acute intracranial abnormality. Redemonstrated large chronic left frontal lobe infarct. 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 Mild ethmoid and left maxillary sinus mucosal thickening. Large left middle ear/mastoid effusion. PFT 12/19/2019 ratio 69% FEV1 80% FVC 139% DLCO 118%  CXR 07/30 > Bilateral  pulmonary interstitial prominence again noted. Bilateral pulmonary alveolar infiltrates again noted. Interim improvement from prior exam. Low lung volumes with bibasilar atelectasis.  Micro Data:  Sars2 7/29: negative Blood 7/29 > ngtd Sputum 7/29 > Urine 7/29 > ngtd  Antimicrobials:  Azithro 7/29 >>8/2 Rocephin 7/29 >> 8/3 Cefepime 7/29 >stopped  Vanc 7/29>stopped    Interim history/subjective:  8/6: doing well. Tolerated atc all day yesterday. Taking po. Will transfer to floor today if able to control hr better. Ccm will follow intermittently for trach. Will need CUFFLESS trach prior to D/C. Defer to ENT on changing but traditionally this is done after approximately 7-10 days once tract has matured slightly. Pt was started on amio infusion overnight 2/2 afib with rvr. This am she remains in afib with rvr will rebolus 8/5: doing well. Placed on cpap will move to atc later today. Denies pain. Consulted speech for pmv and swallow as soon as possible. Otherwise will need fluoro for cortrak 8/4: doing well remains intubated, for trach later today. On heparin but RN aware of stop time for procedure. Tf held. Pt is comfortable on vent at this time.  8/3: remains intubated. Following up with previous attending to see whether [facility] reached back out to determine plan. Agree with extubation not being ideal without eval. Awaiting call back from [facility] to explain situation. Pt is alert and awake and following commands. Son is frustrated with delays as expected.    Objective   Blood pressure 108/77, pulse 91, temperature 98.8 F (37.1 C), temperature source Oral, resp. rate (!) 24, height 4\' 11"  (1.499 m), weight 61.5 kg, SpO2 96 %.    FiO2 (%):  [28 %-40 %] 28 %   Intake/Output Summary (Last 24 hours) at 12/29/2019 0744 Last data  filed at 12/29/2019 0600 Gross per 24 hour  Intake 241.7 ml  Output 1050 ml  Net -808.3 ml   Filed Weights   12/27/19 0500 12/28/19 0400 12/29/19 0400  Weight: 68.8  kg 64.7 kg 61.5 kg    Examination: General: Elderly woman, no distress, on atc sitting up in bed in nad HENT: NCAT, trach in place,  pupils equal Lungs: clear bilaterally anterior and posterior, no wheezing  Cardiovascular: Regular, no murmur Abdomen: Nondistended, positive bowel sounds Extremities: No edema Neuro: Alert, awake, nods to questions, moves all extremities  Resolved Hospital Problem list     Assessment & Plan:  Acute respiratory failure with hypoxia, hypercarbia, requiring mechanical ventilation 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 Appreciate speech    Hx Subglottic stenosis and glottic edema, PFTs as above.  -s/p trach 8/5 -stopped solumedrol -Will need an immunology evaluation as an outpatient   Prior L MCA embolic CVA: residual dysphagia, known aspiration Supportive care   Afib with rvr: previously on amio, eliquis--  Resumed on amio overnight  P Can transition to back to home eliquis today. -resume amio po -rebolus amio 150 at this time -hopefully can wean gtt. -when she was taken off amio and eliquis with concern this contributed to her upper airway issues. However she was not started on any new agent for her afib.  -she also still had upper airway issues warranting this presentation and trach while off these two medications making it even less likely these are contributing.     Chronic normocytic anemia:  -stable, follow  DM2 with hyperglycemia:  - completes steroids today Sliding-scale insulin as ordered   GERD Pepcid -esophageal dysmotility and small hiatal hernia found on esophogram back in June. Certain this plus reflux can exacerbate inflammation of vocal cords - speech recommended continuing h2 blocker and f/u with GI -they have requested referral to Dr  Bryan Lemma after d/c   Best practice:  Diet: NPO  Pain/Anxiety/Delirium protocol (if indicated):  PRN fent  VAP protocol (if indicated): yes DVT prophylaxis: SCDs GI prophylaxis: pepcid  Glucose control: SSI  Mobility: BR Code Status: Full  Family Communication: Updated son at bedside 8/6 Disposition: possible transfer to floor today if hr better controlled. TRH to assume care and CCM will follow periodically for trach.   Labs   CBC: Recent Labs  Lab 12/25/19 0300 12/26/19 0233 12/27/19 0142 12/28/19 0406 12/29/19 0330  WBC 10.9* 10.1 11.1* 15.9* 11.9*  HGB 10.7* 11.1* 11.3* 11.8* 12.3  HCT 32.9* 34.0* 34.7* 36.4 37.6  MCV 88.0 87.9 88.1 91.5 87.9  PLT 225 239 239 265 109    Basic Metabolic Panel: Recent Labs  Lab 12/22/19 1414 12/22/19 1648 12/23/19 0110 12/23/19 0110 12/23/19 1616 12/24/19 0146 12/25/19 0300 12/27/19 0142 12/28/19 0406 12/29/19 0330  NA  --   --  135   < >  --  136 137 138 138 136  K  --   --  4.1   < >  --  3.7 3.9 3.6 4.8 3.9  CL  --   --  101   < >  --  103 103 103 105 101  CO2  --   --  26   < >  --  25 26 27  21* 24  GLUCOSE  --   --  204*   < >  --  263* 201* 159* 115* 128*  BUN  --   --  16   < >  --  21 22 26* 21 15  CREATININE  --   --  0.87   < >  --  0.78 0.69 0.79 0.91 0.74  CALCIUM  --   --  8.3*   < >  --  8.2* 8.0* 8.0* 7.9* 8.4*  MG 1.7 1.8 1.9  --  2.1  --  2.2  --   --   --   PHOS 3.6 3.9 4.2  --  3.4  --   --   --   --   --    < > = values in this interval not displayed.   GFR: Estimated Creatinine Clearance: 51.4 mL/min (by C-G formula based on SCr of 0.74 mg/dL). Recent Labs  Lab 12/23/19 0110 12/24/19 0146 12/26/19 0233 12/27/19 0142 12/28/19 0406 12/29/19 0330  PROCALCITON 0.98  --   --   --   --   --   WBC 15.6*   < > 10.1 11.1* 15.9* 11.9*   < > = values in this interval not displayed.    Liver Function Tests: No results for input(s): AST, ALT, ALKPHOS, BILITOT, PROT, ALBUMIN in the last 168 hours. No  results for input(s): LIPASE, AMYLASE in the last 168 hours. No results for input(s): AMMONIA in the last 168 hours.  ABG    Component Value Date/Time   PHART 7.393 12/21/2019 1945   PCO2ART 51.1 (H) 12/21/2019 1945   PO2ART 215 (H) 12/21/2019 1945   HCO3 30.8 (H) 12/21/2019 1945   TCO2 32 12/21/2019 1945   ACIDBASEDEF 1.0 12/21/2019 1711   O2SAT 100.0 12/21/2019 1945     Coagulation Profile: No results for input(s): INR, PROTIME in the last 168 hours.  Cardiac Enzymes: No results for input(s): CKTOTAL, CKMB, CKMBINDEX, TROPONINI in the last 168 hours.  HbA1C: HbA1c, POC (controlled diabetic range)  Date/Time Value Ref Range Status  11/28/2019 11:06 AM 6.6 0.0 - 7.0 % Final   Hgb A1c MFr Bld  Date/Time Value Ref Range Status  12/02/2019 05:54 PM 7.1 (H) 4.8 - 5.6 % Final    Comment:    (NOTE) Pre diabetes:          5.7%-6.4%  Diabetes:              >6.4%  Glycemic control for   <7.0% adults with diabetes   07/20/2019 03:56 AM 6.1 (H) 4.8 - 5.6 % Final    Comment:    (NOTE) Pre diabetes:          5.7%-6.4% Diabetes:              >6.4% Glycemic control for   <7.0% adults with diabetes   07/20/2019 03:56 AM 6.0 (H) 4.8 - 5.6 % Final    Comment:    (NOTE) Pre diabetes:          5.7%-6.4% Diabetes:              >6.4% Glycemic control for   <7.0% adults with diabetes     CBG: Recent Labs  Lab 12/28/19 1502 12/28/19 1724 12/28/19 1905 12/28/19 2225 12/29/19 0710  GLUCAP 294* 306* 213* 79 136*     care time 35 mins. This represents my time independent of the NPs time taking care of the pt. This is excluding procedures.    [facility] 12/29/2019, 7:44 AM

## 2019-12-29 NOTE — Progress Notes (Signed)
  Speech Language Pathology Treatment: Dysphagia  Patient Details Name: Jamie Mitchell MRN: [id number] DOB: Aug 02, 1945 Today's Date: 12/29/2019 Time: 5520-8022 SLP Time Calculation (min) (ACUTE ONLY): 15 min  Assessment / Plan / Recommendation Clinical Impression  Pt still unable to wear PMSV for more than a few second without feeling pressure and inability to exhale. Expect pt will improve with cuffless trach.  Tolerating mech soft diet and thin liquids with no signs of aspiration. Does reports ongoing, chronic discomfort with swallowing. Spent majority of session reviewing basic reflux precautions; encouraged a wedge pillow, leaving 3-4 hours between last meal and bed time and avoiding caffeine, soda and other common reflux triggers.   HPI HPI: Pt is a 74 year old woman suffers from recurrent stridor and subglottic edema/stinosis presents with acute hypoxic respiratory failure resulting in intubation by EMS. Trach placed by ENT on 8/4.  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  During the hospitalization she underwent IR thrombectomy for which she was intubated for duration of 3 days.  After leaving the hospital her son reports she had dysphonia but no stridor. She was readmitted 0n 10/31/2019 with stridor, ENT exam showed subglottic swelling, confirmed by CT, discharged with a steriod taper. Was reporting delayed regurgitation of PO at that time, esophagram showed Gastroesophageal reflux andBorderline appearance for nonspecific esophageal dysmotility, given the presence of proximal escape and initial secondary and tertiary contractions. Readmitted on 6/25 with similar symptoms, MBS at that time showed Mild oral dysphgia with delayed transit of boluses "No reflux or retrograde movement of boluses observed, however patient did exhibit s/s of some SOB/gasping after successive PO's, as well as instances of belching/gagging but without regurgitation." Primary complaint has  been globus. Pt was tolerating mech soft diet at the time of d/c.       SLP Plan  Continue with current plan of care       Recommendations  Diet recommendations: Dysphagia 3 (mechanical soft);Thin liquid Liquids provided via: Cup;Straw      Patient may use Passy-Muir Speech Valve: with SLP only         Follow up Recommendations: 24 hour supervision/assistance SLP Visit Diagnosis: Dysphagia, unspecified (R13.10) Plan: Continue with current plan of care       GO               Herbie Baltimore, MA [place] Pager [phone] Office [phone]  Lynann Beaver 12/29/2019, 2:29 PM

## 2019-12-29 NOTE — Consult Note (Signed)
   [facility] Inpatient Consult   12/29/2019  Jamie Mitchell Apr 20, 1946 [id number]  [place] patient: Medicare  Patient is currently active with [facility] for chronic disease management services.  Patient has been engaged by a [facility].  Our community based plan of care has focused on disease management and community resource support.   Aware of patient's hospitalization status.  Plan:  Will follow with Inpatient Transition Of Care [TOC] team member to make aware that [facility] following.  Of note, [facility] Care Management services does not replace or interfere with any services that are needed or arranged by inpatient [facility] care management team.  For additional questions or referrals please contact:  Natividad Brood, RN BSN [facility] Liaison  [phone] business mobile phone Toll free office [phone]  Fax number: [phone] [email or web address]

## 2019-12-29 NOTE — Progress Notes (Signed)
Patient ID: Jamie Mitchell, female   DOB: 11-Feb-1946, 74 y.o.   MRN: [id number] Tracheostomy follow-up.  She is awake and alert sitting in the chair.  She is breathing quietly.  The tracheostomy is well seated.  There is no evidence of bleeding.  Plan is to change to a #4 uncuffed trach tube on Monday.  This can be done by respiratory therapy.  Call if there is any additional problems.  We will follow-up as an outpatient.

## 2019-12-30 ENCOUNTER — Encounter (HOSPITAL_COMMUNITY): Payer: Self-pay | Admitting: Internal Medicine

## 2019-12-30 LAB — BASIC METABOLIC PANEL
Anion gap: 12 (ref 5–15)
BUN: 25 mg/dL — ABNORMAL HIGH (ref 8–23)
CO2: 20 mmol/L — ABNORMAL LOW (ref 22–32)
Calcium: 7.9 mg/dL — ABNORMAL LOW (ref 8.9–10.3)
Chloride: 103 mmol/L (ref 98–111)
Creatinine, Ser: 0.82 mg/dL (ref 0.44–1.00)
GFR calc Af Amer: >60 mL/min (ref >60)
GFR calc non Af Amer: >60 mL/min (ref >60)
Glucose, Bld: 160 mg/dL — ABNORMAL HIGH (ref 70–99)
Potassium: 4.1 mmol/L (ref 3.5–5.1)
Sodium: 135 mmol/L (ref 135–145)

## 2019-12-30 LAB — GLUCOSE, CAPILLARY
Glucose-Capillary: 135 mg/dL — ABNORMAL HIGH (ref 70–99)
Glucose-Capillary: 154 mg/dL — ABNORMAL HIGH (ref 70–99)
Glucose-Capillary: 171 mg/dL — ABNORMAL HIGH (ref 70–99)
Glucose-Capillary: 143 mg/dL — ABNORMAL HIGH (ref 70–99)

## 2019-12-30 LAB — CBC
HCT: 35.6 % — ABNORMAL LOW (ref 36.0–46.0)
Hemoglobin: 11.6 g/dL — ABNORMAL LOW (ref 12.0–15.0)
MCH: 28.8 pg (ref 26.0–34.0)
MCHC: 32.6 g/dL (ref 30.0–36.0)
MCV: 88.3 fL (ref 80.0–100.0)
Platelets: 269 10*3/uL (ref 150–400)
RBC: 4.03 MIL/uL (ref 3.87–5.11)
RDW: 17.3 % — ABNORMAL HIGH (ref 11.5–15.5)
WBC: 11.1 10*3/uL — ABNORMAL HIGH (ref 4.0–10.5)
nRBC: 0 % (ref 0.0–0.2)

## 2019-12-30 NOTE — Progress Notes (Signed)
Accepted patient from [place] , [place]. Pt is settled & stable in the room with her husband and son.,

## 2019-12-30 NOTE — Progress Notes (Signed)
PROGRESS NOTE    Jamie Mitchell  VQM:[id number] DOB: 01/02/1946 DOA: 12/21/2019 PCP: Charlott Rakes, MD  Outpatient Specialists:   Brief Narrative: As per documentation on admission "Jamie Mitchell is a 74yo female with PMX significant for thyroid disease, stroke, hypertension, chronic mastoiditis, and atrial fibrillation who presented to ED with acute hypoxic respiratory failure. Per EMS they were intimally called for complaints of shortness of breath, on arrival patient was found with agonal breathing and significant hypoxia with SPO2 of 60% on RA. Patient intubated and started on solumedrol.  CXR showed bilateral opacities, patient started on azithromycin and ceftriaxone with blood, sputum, and urine cultures collected.   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 During this admission she was seen by ENT and PCCM and treated with BIPAP and steroids. ".  Significant Diagnostic Tests:  CT head 7/29 > No CT evidence of acute intracranial abnormality. Redemonstrated large chronic left frontal lobe infarct. 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 Mild ethmoid and left maxillary sinus mucosal thickening. Large left middle ear/mastoid effusion. PFT 12/19/2019 ratio 69% FEV1 80% FVC 139% DLCO 118% CXR 07/30 > Bilateral pulmonary interstitial prominence again noted. Bilateral pulmonary alveolar infiltrates again noted. Interim improvement from prior exam. Low lung volumes with bibasilar atelectasis.  Micro Data:  Sars2 7/29: negative Blood 7/29 > ngtd Sputum 7/29 > Urine 7/29 > ngtd  Antimicrobials:  Azithro 7/29 >>8/2 Rocephin 7/29 >> 8/3 Cefepime 7/29 >stopped  Vanc 7/29>stopped    Patient is currently trached.  Patient has been transferred to the hospitalist team.   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 -ENT input is highly appreciated.  Hx Subglottic stenosis and glottic edema,PFTs as above.  -s/p trach 8/5 -stopped solumedrol -As per prior documentation, patient well need an immunology evaluation as an outpatient  Prior L MCA embolic CVA: residual dysphagia, known aspiration Supportive care  Afib with rvr: -Heart rate is currently controlled. -Continue Eliquis and amiodarone.    Chronic normocytic anemia:  -stable  DM2 with hyperglycemia:  -Continue to monitor closely. Sliding-scale insulin as ordered   GERD Pepcid -esophageal dysmotility and small hiatal hernia found on esophogram back in June.  - speech recommended continuing h2 blocker and f/u with GI -they have requested referral to Dr Bryan Lemma after d/c   DVT prophylaxis: Apixaban. Code Status: Full code. Family Communication:  Disposition Plan: This will depend on hospital course.   Consultants:   ICU team.  ENT.  Procedures:   Tracheostomy.  Antimicrobials:   None   Subjective: No new complaints.  Objective: Vitals:   12/30/19 1030 12/30/19 1100 12/30/19 1109 12/30/19 1200  BP: 108/74 106/81  (!) 108/96  Pulse: 69 67  (!) 44  Resp: (!) 26 20  (!) 21  Temp:   98.4 F (36.9 C)   TempSrc:   Oral   SpO2: 98% 96%  99%  Weight:      Height:        Intake/Output Summary (Last 24 hours) at 12/30/2019 1435 Last data filed at 12/29/2019 2000 Gross per 24 hour  Intake --  Output 150 ml  Net -150 ml   [employer]   12/28/19  0400 12/29/19 0400 12/30/19 0500  Weight: 64.7 kg 61.5 kg 64.4 kg    Examination:  General exam: Appears calm and comfortable.  Patient has trach in  place. Respiratory system: Decreased air entry globally.  Cardiovascular system: S1 & S2  Gastrointestinal system: Abdomen is nondistended, soft and nontender. No organomegaly or masses felt. Normal bowel sounds heard. Central nervous system: Alert and oriented.  Patient moves all extremities.   Extremities: No leg edema.  Data Reviewed: I have personally reviewed following labs and imaging studies  CBC: Recent Labs  Lab 12/26/19 0233 12/27/19 0142 12/28/19 0406 12/29/19 0330 12/30/19 0142  WBC 10.1 11.1* 15.9* 11.9* 11.1*  HGB 11.1* 11.3* 11.8* 12.3 11.6*  HCT 34.0* 34.7* 36.4 37.6 35.6*  MCV 87.9 88.1 91.5 87.9 88.3  PLT 239 239 265 257 829   Basic Metabolic Panel: Recent Labs  Lab 12/23/19 1616 12/24/19 0146 12/25/19 0300 12/27/19 0142 12/28/19 0406 12/29/19 0330 12/30/19 0142  NA  --    < > 137 138 138 136 135  K  --    < > 3.9 3.6 4.8 3.9 4.1  CL  --    < > 103 103 105 101 103  CO2  --    < > 26 27 21* 24 20*  GLUCOSE  --    < > 201* 159* 115* 128* 160*  BUN  --    < > 22 26* 21 15 25*  CREATININE  --    < > 0.69 0.79 0.91 0.74 0.82  CALCIUM  --    < > 8.0* 8.0* 7.9* 8.4* 7.9*  MG 2.1  --  2.2  --   --   --   --   PHOS 3.4  --   --   --   --   --   --    < > = values in this interval not displayed.   GFR: Estimated Creatinine Clearance: 51.4 mL/min (by C-G formula based on SCr of 0.82 mg/dL). Liver Function Tests: No results for input(s): AST, ALT, ALKPHOS, BILITOT, PROT, ALBUMIN in the last 168 hours. No results for input(s): LIPASE, AMYLASE in the last 168 hours. No results for input(s): AMMONIA in the last 168 hours. Coagulation Profile: No results for input(s): INR, PROTIME in the last 168 hours. Cardiac Enzymes: No results for input(s): CKTOTAL, CKMB, CKMBINDEX, TROPONINI in the last 168 hours. BNP (last 3 results) No results for input(s): PROBNP in the last 8760 hours. HbA1C: No results for input(s): HGBA1C in the last 72 hours. CBG: Recent Labs   Lab 12/29/19 1501 12/29/19 1920 12/29/19 2223 12/30/19 0705 12/30/19 1108  GLUCAP 216* 161* 145* 135* 154*   Lipid Profile: Recent Labs    12/28/19 0406  TRIG 73   Thyroid Function Tests: No results for input(s): TSH, T4TOTAL, FREET4, T3FREE, THYROIDAB in the last 72 hours. Anemia Panel: No results for input(s): VITAMINB12, FOLATE, FERRITIN, TIBC, IRON, RETICCTPCT in the last 72 hours. Urine analysis:    Component Value Date/Time   COLORURINE YELLOW 12/21/2019 1800   APPEARANCEUR CLEAR 12/21/2019 1800   LABSPEC 1.010 12/21/2019 1800   PHURINE 7.0 12/21/2019 1800   GLUCOSEU >=500 (A) 12/21/2019 1800   HGBUR NEGATIVE 12/21/2019 1800   BILIRUBINUR NEGATIVE 12/21/2019 1800   KETONESUR NEGATIVE 12/21/2019 1800   PROTEINUR 30 (A) 12/21/2019 1800   NITRITE NEGATIVE 12/21/2019 1800   LEUKOCYTESUR NEGATIVE 12/21/2019 1800   Sepsis Labs: @LABRCNTIP (procalcitonin:4,lacticidven:4)  ) Recent Results (from the past 240 hour(s))  Blood Culture (routine x 2)     Status: None   Collection Time: 12/21/19  5:04 PM   Specimen: BLOOD RIGHT WRIST  Result Value Ref Range Status   Specimen Description BLOOD RIGHT WRIST  Final   Special Requests   Final    BOTTLES DRAWN AEROBIC AND ANAEROBIC Blood Culture adequate volume   Culture   Final    NO GROWTH 5 DAYS Performed at [facility], [address], [place], [place] [address]    Report Status 12/27/2019 FINAL  Final  SARS Coronavirus 2 by RT PCR (hospital order, performed in [facility] hospital lab) Nasopharyngeal Nasopharyngeal Swab     Status: None   Collection Time: 12/21/19  5:07 PM   Specimen: Nasopharyngeal Swab  Result Value Ref Range Status   SARS Coronavirus 2 NEGATIVE NEGATIVE Final    Comment: (NOTE) SARS-CoV-2 target nucleic acids are NOT DETECTED.  The SARS-CoV-2 RNA is generally detectable in upper and lower respiratory specimens during the acute phase of infection. The lowest concentration of SARS-CoV-2  viral copies this assay can detect is 250 copies / mL. A negative result does not preclude SARS-CoV-2 infection and should not be used as the sole basis for treatment or other patient management decisions.  A negative result may occur with improper specimen collection / handling, submission of specimen other than nasopharyngeal swab, presence of viral mutation(s) within the areas targeted by this assay, and inadequate number of viral copies (<250 copies / mL). A negative result must be combined with clinical observations, patient history, and epidemiological information.  Fact Sheet for Patients:   StrictlyIdeas.no  Fact Sheet for Healthcare Providers: BankingDealers.co.za  This test is not yet approved or  cleared by the [place] FDA and has been authorized for detection and/or diagnosis of SARS-CoV-2 by FDA under an Emergency Use Authorization (EUA).  This EUA will remain in effect (meaning this test can be used) for the duration of the COVID-19 declaration under Section 564(b)(1) of the Act, 21 U.S.C. section 360bbb-3(b)(1), unless the authorization is terminated or revoked sooner.  Performed at [facility], [place] [address], [place], [place] [address]   Urine culture     Status: None   Collection Time: 12/21/19  6:33 PM   Specimen: In/Out Cath Urine  Result Value Ref Range Status   Specimen Description IN/OUT CATH URINE  Final   Special Requests NONE  Final   Culture   Final    NO GROWTH Performed at [facility], [place] [address], [place], [place] [address]    Report Status 12/22/2019 FINAL  Final  Blood Culture (routine x 2)     Status: None   Collection Time: 12/21/19  6:48 PM   Specimen: BLOOD  Result Value Ref Range Status   Specimen Description BLOOD LEFT ANTECUBITAL  Final   Special Requests   Final    BOTTLES DRAWN AEROBIC AND ANAEROBIC Blood Culture adequate volume   Culture   Final    NO GROWTH 5  DAYS Performed at [facility], [place] [address] [place], [place] [address]    Report Status 12/26/2019 FINAL  Final  MRSA PCR Screening     Status: None   Collection Time: 12/21/19 11:04 PM   Specimen: Nasal Mucosa; Nasopharyngeal  Result Value Ref Range Status   MRSA by PCR NEGATIVE NEGATIVE Final    Comment:        The GeneXpert MRSA Assay (FDA approved for NASAL  specimens only), is one component of a comprehensive MRSA colonization surveillance program. It is not intended to diagnose MRSA infection nor to guide or monitor treatment for MRSA infections. Performed at [facility], [place] [address], [place], [place] [address]          Radiology Studies: No results found.      Scheduled Meds: . amiodarone  200 mg Oral Daily  . apixaban  5 mg Oral BID  . atorvastatin  40 mg Oral QPM  . chlorhexidine  15 mL Mouth Rinse BID  . Chlorhexidine Gluconate Cloth  6 each Topical Q0600  . docusate  100 mg Oral BID  . famotidine  20 mg Oral BID  . feeding supplement (ENSURE ENLIVE)  237 mL Oral BID BM  . insulin aspart  0-20 Units Subcutaneous TID AC & HS  . levothyroxine  88 mcg Oral QAC breakfast  . mouth rinse  15 mL Mouth Rinse q12n4p  . midodrine  10 mg Oral TID WC  . polyethylene glycol  17 g Oral Daily   Continuous Infusions: . sodium chloride Stopped (12/25/19 1942)     LOS: 9 days    Time spent: Greater than 35 minutes.    Dana Allan, MD  [facility] Pager #: [phone] 7PM-7AM contact night coverage as above

## 2019-12-31 DIAGNOSIS — E869 Volume depletion, unspecified: Secondary | ICD-10-CM

## 2019-12-31 DIAGNOSIS — I4891 Unspecified atrial fibrillation: Secondary | ICD-10-CM

## 2019-12-31 LAB — CBC
HCT: 39.1 % (ref 36.0–46.0)
Hemoglobin: 12.7 g/dL (ref 12.0–15.0)
MCH: 28.4 pg (ref 26.0–34.0)
MCHC: 32.5 g/dL (ref 30.0–36.0)
MCV: 87.5 fL (ref 80.0–100.0)
Platelets: 283 10*3/uL (ref 150–400)
RBC: 4.47 MIL/uL (ref 3.87–5.11)
RDW: 17.4 % — ABNORMAL HIGH (ref 11.5–15.5)
WBC: 8.5 10*3/uL (ref 4.0–10.5)
nRBC: 0 % (ref 0.0–0.2)

## 2019-12-31 LAB — BASIC METABOLIC PANEL
Anion gap: 8 (ref 5–15)
BUN: 12 mg/dL (ref 8–23)
CO2: 24 mmol/L (ref 22–32)
Calcium: 8.4 mg/dL — ABNORMAL LOW (ref 8.9–10.3)
Chloride: 104 mmol/L (ref 98–111)
Creatinine, Ser: 0.78 mg/dL (ref 0.44–1.00)
GFR calc Af Amer: >60 mL/min (ref >60)
GFR calc non Af Amer: >60 mL/min (ref >60)
Glucose, Bld: 116 mg/dL — ABNORMAL HIGH (ref 70–99)
Potassium: 4.2 mmol/L (ref 3.5–5.1)
Sodium: 136 mmol/L (ref 135–145)

## 2019-12-31 LAB — GLUCOSE, CAPILLARY
Glucose-Capillary: 228 mg/dL — ABNORMAL HIGH (ref 70–99)
Glucose-Capillary: 275 mg/dL — ABNORMAL HIGH (ref 70–99)
Glucose-Capillary: 93 mg/dL (ref 70–99)
Glucose-Capillary: 302 mg/dL — ABNORMAL HIGH (ref 70–99)

## 2019-12-31 MED ORDER — ARFORMOTEROL TARTRATE 15 MCG/2ML IN NEBU
15.0000 ug | INHALATION_SOLUTION | Freq: Two times a day (BID) | RESPIRATORY_TRACT | Status: DC
  Administered 2019-12-31 – 2020-01-04 (×8): 15 ug via RESPIRATORY_TRACT
  Filled 2019-12-31 (×8): qty 2

## 2019-12-31 MED ORDER — FLUTICASONE FUROATE-VILANTEROL 100-25 MCG/INH IN AEPB
1.0000 | INHALATION_SPRAY | Freq: Every day | RESPIRATORY_TRACT | Status: DC
  Filled 2019-12-31: qty 28

## 2019-12-31 MED ORDER — UMECLIDINIUM BROMIDE 62.5 MCG/INH IN AEPB
1.0000 | INHALATION_SPRAY | Freq: Every day | RESPIRATORY_TRACT | Status: DC
  Filled 2019-12-31: qty 7

## 2019-12-31 MED ORDER — BUDESONIDE 0.25 MG/2ML IN SUSP
0.2500 mg | Freq: Two times a day (BID) | RESPIRATORY_TRACT | Status: DC
  Administered 2019-12-31 – 2020-01-04 (×8): 0.25 mg via RESPIRATORY_TRACT
  Filled 2019-12-31 (×8): qty 2

## 2019-12-31 MED ORDER — LEVALBUTEROL HCL 0.63 MG/3ML IN NEBU
0.6300 mg | INHALATION_SOLUTION | Freq: Four times a day (QID) | RESPIRATORY_TRACT | Status: DC | PRN
  Administered 2019-12-31: 0.63 mg via RESPIRATORY_TRACT
  Filled 2019-12-31: qty 3

## 2019-12-31 MED ORDER — SODIUM CHLORIDE 0.9 % IV SOLN: INTRAVENOUS | Status: DC

## 2019-12-31 MED ORDER — SODIUM CHLORIDE 3 % IN NEBU
4.0000 mL | INHALATION_SOLUTION | Freq: Every day | RESPIRATORY_TRACT | Status: AC
  Administered 2019-12-31 – 2020-01-02 (×3): 4 mL via RESPIRATORY_TRACT
  Filled 2019-12-31 (×3): qty 4

## 2019-12-31 MED ORDER — PREDNISONE 20 MG PO TABS
40.0000 mg | ORAL_TABLET | Freq: Every day | ORAL | Status: DC
  Administered 2019-12-31 – 2020-01-03 (×4): 40 mg via ORAL
  Filled 2019-12-31 (×4): qty 2

## 2019-12-31 MED ORDER — SODIUM CHLORIDE 0.9 % IV BOLUS
500.0000 mL | Freq: Once | INTRAVENOUS | Status: AC
  Administered 2019-12-31: 500 mL via INTRAVENOUS

## 2019-12-31 NOTE — Progress Notes (Signed)
Pt changed from O2 TC to RA TC with increased flow to provide more humidity since pt plugged off earlier today.  Pt sats have remained 95% or greater while RT present.  RT explained to RN how to switch humidity bottle back to O2 if pt requires O2 at any point.  RT will continue to monitor.

## 2019-12-31 NOTE — Progress Notes (Signed)
PROGRESS NOTE    Jamie Mitchell  QPY:[id number] DOB: 09/04/1945 DOA: 12/21/2019 PCP: Charlott Rakes, MD  Outpatient Specialists:   Brief Narrative: As per documentation on admission "Jamie Mitchell is a 74yo female with PMX significant for thyroid disease, stroke, hypertension, chronic mastoiditis, and atrial fibrillation who presented to ED with acute hypoxic respiratory failure. Per EMS they were intimally called for complaints of shortness of breath, on arrival patient was found with agonal breathing and significant hypoxia with SPO2 of 60% on RA. Patient intubated and started on solumedrol.  CXR showed bilateral opacities, patient started on azithromycin and ceftriaxone with blood, sputum, and urine cultures collected.   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 During this admission she was seen by ENT and PCCM and treated with BIPAP and steroids. ".  12/31/2019: Patient seen alongside patient's son and husband.  Thick secretions noted.  Nursing staff is finding it difficult to suction the secretions.  Will try hypertonic saline nebulizer, get respiratory therapist to assist with secretions and will send the sputum for culture.  Will hydrate patient.  Patient is still wheezing.  Will start patient on prednisone 40 Mg p.o. once daily.  We will also start Breo Ellipta and Umeclidinium Ellipta.  Will discontinue as needed duo nebs as patient has A. fib/a flutter with RVR.  Will use Xopenex inhaler as needed.  Hopefully, with hydration and adequate secretion, the heart rate will improve.  Will have low threshold to consult cardiology to assist with A. fib a flutter management.  Systolic blood pressure is 84 mmHg.  Hopefully, this will also improve with hydration.  Despite above, patient continues to look stable.  Significant Diagnostic Tests:  CT head 7/29 > No CT evidence of acute intracranial abnormality.  Redemonstrated large chronic left frontal lobe infarct. 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 Mild ethmoid and left maxillary sinus mucosal thickening. Large left middle ear/mastoid effusion. PFT 12/19/2019 ratio 69% FEV1 80% FVC 139% DLCO 118% CXR 07/30 > Bilateral pulmonary interstitial prominence again noted. Bilateral pulmonary alveolar infiltrates again noted. Interim improvement from prior exam. Low lung volumes with bibasilar atelectasis.  Micro Data:  Sars2 7/29: negative Blood 7/29 > ngtd Sputum 7/29 > Urine 7/29 > ngtd  Antimicrobials:  Azithro 7/29 >>8/2 Rocephin 7/29 >> 8/3 Cefepime 7/29 >stopped  Vanc 7/29>stopped    Patient is currently trached.  Patient has been transferred to the hospitalist team.   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 -ENT input is highly appreciated. 12/31/2019: Currently see above documentation.  Still wheezing.  Thick secretions noted.  Management of documented above.  Hx Subglottic stenosis and glottic edema,PFTs as above.  -s/p trach 8/5 -stopped solumedrol -As per prior documentation, patient well need an immunology evaluation as an outpatient  Prior L MCA embolic CVA: residual dysphagia, known aspiration Supportive care  Afib with rvr: -Heart rate is currently controlled. -Continue Eliquis and amiodarone. 12/31/2019: Heart rate is now currently controlled.  Patient is seen in A. fib/a flutter with RVR.  Will monitor closely after adequate hydration and management of thick secretions.  Will have low threshold to consult cardiology.  Chronic  normocytic anemia:  -stable  DM2 with hyperglycemia:  -Continue to monitor closely. Sliding-scale insulin as ordered   GERD Pepcid -esophageal dysmotility and small hiatal hernia found on esophogram back in June.  - speech recommended continuing h2 blocker and f/u with GI -they have requested referral to Dr Bryan Lemma after d/c   DVT prophylaxis: Apixaban. Code Status: Full code. Family Communication:  Disposition Plan: This will depend on hospital course.   Consultants:   ICU team.  ENT.  Procedures:   Tracheostomy.  Antimicrobials:   None   Subjective: No new complaints. Thick secretions. A. fib/a flutter with RVR.  Objective: Vitals:   12/31/19 0309 12/31/19 0500 12/31/19 0530 12/31/19 0805  BP: 99/62     Pulse: 98  92 (!) 112  Resp: (!) 22  14 (!) 22  Temp: 98.7 F (37.1 C)     TempSrc: Oral     SpO2: 99%  96% 99%  Weight:  64.3 kg    Height:        Intake/Output Summary (Last 24 hours) at 12/31/2019 1040 Last data filed at 12/30/2019 2300 Gross per 24 hour  Intake --  Output 400 ml  Net -400 ml   [employer]   12/29/19 0400 12/30/19 0500 12/31/19 0500  Weight: 61.5 kg 64.4 kg 64.3 kg    Examination:  General exam: Appears calm and comfortable.  Patient has trach in place.  Dry buccal membrane. Respiratory system: Decreased air entry globally, with inspiratory and expiratory wheeze Cardiovascular system: S1 & S2  Gastrointestinal system: Abdomen is nondistended, soft and nontender. No organomegaly or masses felt. Normal bowel sounds heard. Central nervous system: Alert and oriented.  Patient moves all extremities.   Extremities: No leg edema.  Data Reviewed: I have personally reviewed following labs and imaging studies  CBC: Recent Labs  Lab 12/27/19 0142 12/28/19 0406 12/29/19 0330 12/30/19 0142 12/31/19 0148  WBC 11.1* 15.9* 11.9* 11.1* 8.5  HGB 11.3* 11.8* 12.3 11.6* 12.7  HCT 34.7* 36.4 37.6 35.6* 39.1  MCV 88.1 91.5  87.9 88.3 87.5  PLT 239 265 257 269 381   Basic Metabolic Panel: Recent Labs  Lab 12/25/19 0300 12/25/19 0300 12/27/19 0142 12/28/19 0406 12/29/19 0330 12/30/19 0142 12/31/19 0148  NA 137   < > 138 138 136 135 136  K 3.9   < > 3.6 4.8 3.9 4.1 4.2  CL 103   < > 103 105 101 103 104  CO2 26   < > 27 21* 24 20* 24  GLUCOSE 201*   < > 159* 115* 128* 160* 116*  BUN 22   < > 26* 21 15 25* 12  CREATININE 0.69   < > 0.79 0.91 0.74 0.82 0.78  CALCIUM 8.0*   < > 8.0* 7.9* 8.4* 7.9* 8.4*  MG 2.2  --   --   --   --   --   --    < > = values in this interval not displayed.   GFR: Estimated Creatinine Clearance: 52.5 mL/min (by C-G formula based on SCr of 0.78 mg/dL). Liver Function Tests: No results for input(s): AST, ALT, ALKPHOS, BILITOT, PROT, ALBUMIN in the last 168 hours. No results for input(s): LIPASE, AMYLASE in the last 168 hours. No results for input(s): AMMONIA in the last 168 hours. Coagulation Profile: No results for input(s): INR, PROTIME in the last 168 hours. Cardiac Enzymes: No results for input(s): CKTOTAL, CKMB, CKMBINDEX, TROPONINI in the last 168 hours. BNP (last 3 results) No results for input(s): PROBNP  in the last 8760 hours. HbA1C: No results for input(s): HGBA1C in the last 72 hours. CBG: Recent Labs  Lab 12/30/19 0705 12/30/19 1108 12/30/19 1507 12/30/19 2029 12/31/19 0800  GLUCAP 135* 154* 171* 143* 228*   Lipid Profile: No results for input(s): CHOL, HDL, LDLCALC, TRIG, CHOLHDL, LDLDIRECT in the last 72 hours. Thyroid Function Tests: No results for input(s): TSH, T4TOTAL, FREET4, T3FREE, THYROIDAB in the last 72 hours. Anemia Panel: No results for input(s): VITAMINB12, FOLATE, FERRITIN, TIBC, IRON, RETICCTPCT in the last 72 hours. Urine analysis:    Component Value Date/Time   COLORURINE YELLOW 12/21/2019 1800   APPEARANCEUR CLEAR 12/21/2019 1800   LABSPEC 1.010 12/21/2019 1800   PHURINE 7.0 12/21/2019 1800   GLUCOSEU >=500 (A) 12/21/2019  1800   HGBUR NEGATIVE 12/21/2019 1800   BILIRUBINUR NEGATIVE 12/21/2019 1800   KETONESUR NEGATIVE 12/21/2019 1800   PROTEINUR 30 (A) 12/21/2019 1800   NITRITE NEGATIVE 12/21/2019 1800   LEUKOCYTESUR NEGATIVE 12/21/2019 1800   Sepsis Labs: @LABRCNTIP (procalcitonin:4,lacticidven:4)  ) Recent Results (from the past 240 hour(s))  Blood Culture (routine x 2)     Status: None   Collection Time: 12/21/19  5:04 PM   Specimen: BLOOD RIGHT WRIST  Result Value Ref Range Status   Specimen Description BLOOD RIGHT WRIST  Final   Special Requests   Final    BOTTLES DRAWN AEROBIC AND ANAEROBIC Blood Culture adequate volume   Culture   Final    NO GROWTH 5 DAYS Performed at [facility], [address], [place], [place] [address]    Report Status 12/27/2019 FINAL  Final  SARS Coronavirus 2 by RT PCR (hospital order, performed in [facility] hospital lab) Nasopharyngeal Nasopharyngeal Swab     Status: None   Collection Time: 12/21/19  5:07 PM   Specimen: Nasopharyngeal Swab  Result Value Ref Range Status   SARS Coronavirus 2 NEGATIVE NEGATIVE Final    Comment: (NOTE) SARS-CoV-2 target nucleic acids are NOT DETECTED.  The SARS-CoV-2 RNA is generally detectable in upper and lower respiratory specimens during the acute phase of infection. The lowest concentration of SARS-CoV-2 viral copies this assay can detect is 250 copies / mL. A negative result does not preclude SARS-CoV-2 infection and should not be used as the sole basis for treatment or other patient management decisions.  A negative result may occur with improper specimen collection / handling, submission of specimen other than nasopharyngeal swab, presence of viral mutation(s) within the areas targeted by this assay, and inadequate number of viral copies (<250 copies / mL). A negative result must be combined with clinical observations, patient history, and epidemiological information.  Fact Sheet for Patients:    StrictlyIdeas.no  Fact Sheet for Healthcare Providers: BankingDealers.co.za  This test is not yet approved or  cleared by the [place] FDA and has been authorized for detection and/or diagnosis of SARS-CoV-2 by FDA under an Emergency Use Authorization (EUA).  This EUA will remain in effect (meaning this test can be used) for the duration of the COVID-19 declaration under Section 564(b)(1) of the Act, 21 U.S.C. section 360bbb-3(b)(1), unless the authorization is terminated or revoked sooner.  Performed at [facility], [place] [address] [place], [place] [address]   Urine culture     Status: None   Collection Time: 12/21/19  6:33 PM   Specimen: In/Out Cath Urine  Result Value Ref Range Status   Specimen Description IN/OUT CATH URINE  Final   Special Requests NONE  Final  Culture   Final    NO GROWTH Performed at [facility], [place] [address], [place], [place] [address]    Report Status 12/22/2019 FINAL  Final  Blood Culture (routine x 2)     Status: None   Collection Time: 12/21/19  6:48 PM   Specimen: BLOOD  Result Value Ref Range Status   Specimen Description BLOOD LEFT ANTECUBITAL  Final   Special Requests   Final    BOTTLES DRAWN AEROBIC AND ANAEROBIC Blood Culture adequate volume   Culture   Final    NO GROWTH 5 DAYS Performed at [facility], [place] [address], [place], [place] [address]    Report Status 12/26/2019 FINAL  Final  MRSA PCR Screening     Status: None   Collection Time: 12/21/19 11:04 PM   Specimen: Nasal Mucosa; Nasopharyngeal  Result Value Ref Range Status   MRSA by PCR NEGATIVE NEGATIVE Final    Comment:        The GeneXpert MRSA Assay (FDA approved for NASAL specimens only), is one component of a comprehensive MRSA colonization surveillance program. It is not intended to diagnose MRSA infection nor to guide or monitor treatment for MRSA infections. Performed at [facility], [place] [address] [place], [place] [address]          Radiology Studies: No results found.      Scheduled Meds:  amiodarone  200 mg Oral Daily   apixaban  5 mg Oral BID   atorvastatin  40 mg Oral QPM   chlorhexidine  15 mL Mouth Rinse BID   Chlorhexidine Gluconate Cloth  6 each Topical Q0600   docusate  100 mg Oral BID   famotidine  20 mg Oral BID   feeding supplement (ENSURE ENLIVE)  237 mL Oral BID BM   insulin aspart  0-20 Units Subcutaneous TID AC & HS   levothyroxine  88 mcg Oral QAC breakfast   mouth rinse  15 mL Mouth Rinse q12n4p   midodrine  10 mg Oral TID WC   polyethylene glycol  17 g Oral Daily   sodium chloride HYPERTONIC  4 mL Nebulization Daily   Continuous Infusions:  sodium chloride Stopped (12/25/19 1942)   sodium chloride     sodium chloride       LOS: 10 days    Time spent: Greater than 35 minutes.    Dana Allan, MD  [facility] Pager #: [phone] 7PM-7AM contact night coverage as above

## 2020-01-01 ENCOUNTER — Ambulatory Visit: Payer: Medicare Other | Admitting: Family Medicine

## 2020-01-01 ENCOUNTER — Inpatient Hospital Stay: Payer: Medicare Other | Admitting: Family Medicine

## 2020-01-01 LAB — BASIC METABOLIC PANEL
Anion gap: 10 (ref 5–15)
BUN: 20 mg/dL (ref 8–23)
CO2: 19 mmol/L — ABNORMAL LOW (ref 22–32)
Calcium: 8.3 mg/dL — ABNORMAL LOW (ref 8.9–10.3)
Chloride: 106 mmol/L (ref 98–111)
Creatinine, Ser: 0.95 mg/dL (ref 0.44–1.00)
GFR calc Af Amer: >60 mL/min (ref >60)
GFR calc non Af Amer: >60 mL/min (ref >60)
Glucose, Bld: 189 mg/dL — ABNORMAL HIGH (ref 70–99)
Potassium: 4.3 mmol/L (ref 3.5–5.1)
Sodium: 135 mmol/L (ref 135–145)

## 2020-01-01 LAB — GLUCOSE, CAPILLARY
Glucose-Capillary: 116 mg/dL — ABNORMAL HIGH (ref 70–99)
Glucose-Capillary: 304 mg/dL — ABNORMAL HIGH (ref 70–99)
Glucose-Capillary: 275 mg/dL — ABNORMAL HIGH (ref 70–99)
Glucose-Capillary: 173 mg/dL — ABNORMAL HIGH (ref 70–99)

## 2020-01-01 MED ORDER — FLUDROCORTISONE ACETATE 0.1 MG PO TABS
0.1000 mg | ORAL_TABLET | Freq: Two times a day (BID) | ORAL | Status: DC
  Administered 2020-01-01 – 2020-01-03 (×4): 0.1 mg via ORAL
  Filled 2020-01-01 (×5): qty 1

## 2020-01-01 MED ORDER — MIDODRINE HCL 5 MG PO TABS
5.0000 mg | ORAL_TABLET | Freq: Three times a day (TID) | ORAL | Status: DC
  Administered 2020-01-01 – 2020-01-04 (×10): 5 mg via ORAL
  Filled 2020-01-01 (×10): qty 1

## 2020-01-01 NOTE — Progress Notes (Signed)
PROGRESS NOTE    Jamie Mitchell  UTM:[id number] DOB: [birth year] DOA: 12/21/2019 PCP: Charlott Rakes, MD      Brief Narrative:  [name] is a 74 y.o. F with Afib, DM, HTN, hypothyroidism, and hx stroke who presented with       Assessment & Plan:  Acute hypoxic respiratory failure and severe sepsis due to multifocal pneumonia Presented with SPO2 60 and dyspnea as well as tachycardia, tachypnea, leukocytosis and pneumonia on chest x-ray.  Admitted to ICU and completed 6 days cefepime.  Patient now mentating at baseline, taking orals.  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  She was admitted repeatedly with stridor, respiratory distress, found on imaging to have a subglottic swelling/fullness of unknown etiology.  This appeared to resolve with steroids repeatedly.  She was evaluated by Drs. Jamie Mitchell and Jamie Mitchell and Jamie Mitchell and referred to [facility] where she underwent laryngoscopy and it appears from their notes that they suspect this is related to chronic aspiration.  During the current illness, this subglottic stenosis was again contributing to her airway compromise and so the decision was made to place a tracheostomy. -Continue prednisone and taper at discharge -Continue ensure -SLP -Trach teaching -DME supplies are in place  Orthostatic hypotension BP normal during the day on midodrine Nursing note that she she is off midodrine at night, she has orthostatic symptoms and low BP with sitting up. -Reduce midodrine -Add fluorinef -Ted hose  Paroxysmal atrial fibrillation Rate controlled -Continue amiodarone -Continue apixaban  Hypertension Stroke, secondary prevention BP had been low requiring midodrine BP normal now -Continue midodrine  Diabetes, type II without complication, uncontrolled without  long-term insulin use Glucose controlled -Hold metformin -Continue SS corrections  Hypothyroidism -Continue levothyroxine           Disposition: Status is: Inpatient  Remains inpatient appropriate because:she remains to be weaned off O2 and complete tracheostomy teaching   Dispo: The patient is from: Home              Anticipated d/c is to: Home              Anticipated d/c date is: 1 day              Patient currently is not medically stable to d/c.      The patient was admitted with pneumonia and a recurrent subglottic stenosis. She has completed treatment for her pneumonia and had a tracheostomy placed.  If she is successfully weaned off O2 and humidified air today, and her BP/orthostatic pressures remain normal and asymptomatic with standing, she is likely ready for discharge tomorrow when trach teaching is complete.        MDM: The below labs and imaging reports were reviewed and summarized above.  Medication management as above.   DVT prophylaxis: SCDs Start: 12/21/19 1949 apixaban (ELIQUIS) tablet 5 mg  Code Status: FULL Family Communication: Son at bedside    Consultants:   PCCM  ENT  Procedures:   8/4 tracheostomy  Antimicrobials:   Azithromycin and ceftriaxone 7/29 >> 8/3   Culture data:   7/29 blood culture x2 NG  7/29 urine culture NG           Subjective: All h istory collected through interpreter at the bedside.   No fever, cough, chest discomfort, dyspnea.  No confusion, weakness.  Has some manageable secretions from the trach.  Objective: Vitals:   01/01/20 0315 01/01/20 0340 01/01/20 0500 01/01/20 0733  BP:  103/66  129/67  Pulse: 67 67  76  Resp: 11 20  (!) 25  Temp:  98 F (36.7 C)  98.3 F (36.8 C)  TempSrc:  Oral  Oral  SpO2: 99% 93%  97%  Weight:   66 kg   Height:        Intake/Output Summary (Last 24 hours) at 01/01/2020 0813 Last data filed at 01/01/2020 0335 Gross per 24 hour  Intake 1547.43 ml   Output --  Net 1547.43 ml   Filed Weights   12/30/19 0500 12/31/19 0500 01/01/20 0500  Weight: 64.4 kg 64.3 kg 66 kg    Examination: General appearance: elderly adult female, alert and in no acute distress. Sitting up in bed, interactive, pleasant   HEENT: Anicteric, conjunctiva pink, lids and lashes normal. No nasal deformity, discharge, epistaxis.  Lips moist, trach in place, minimal secretions.   Skin: Warm and dry.  no jaundice.  No suspicious rashes or lesions. Cardiac: RRR, nl S1-S2, no murmurs appreciated.  Capillary refill is brisk.  JVPnot visible.  No LE edema.  Radial pulses 2+ and symmetric. Respiratory: Normal respiratory rate and rhythm.  CTAB without rales or wheezes. Abdomen: Abdomen soft.  no TTP. No ascites, distension, hepatosplenomegaly.   MSK: No deformities or effusions. Neuro: Awake and alert.  EOMI, moves all extremities. Speech fluent.    Psych: Sensorium intact and responding to questions, attention normal. Affect pleasant.  Judgment and insight appear normal.    Data Reviewed: I have personally reviewed following labs and imaging studies:  CBC: Recent Labs  Lab 12/27/19 0142 12/28/19 0406 12/29/19 0330 12/30/19 0142 12/31/19 0148  WBC 11.1* 15.9* 11.9* 11.1* 8.5  HGB 11.3* 11.8* 12.3 11.6* 12.7  HCT 34.7* 36.4 37.6 35.6* 39.1  MCV 88.1 91.5 87.9 88.3 87.5  PLT 239 265 257 269 782   Basic Metabolic Panel: Recent Labs  Lab 12/28/19 0406 12/29/19 0330 12/30/19 0142 12/31/19 0148 01/01/20 0153  NA 138 136 135 136 135  K 4.8 3.9 4.1 4.2 4.3  CL 105 101 103 104 106  CO2 21* 24 20* 24 19*  GLUCOSE 115* 128* 160* 116* 189*  BUN 21 15 25* 12 20  CREATININE 0.91 0.74 0.82 0.78 0.95  CALCIUM 7.9* 8.4* 7.9* 8.4* 8.3*   GFR: Estimated Creatinine Clearance: 44.8 mL/min (by C-G formula based on SCr of 0.95 mg/dL). Liver Function Tests: No results for input(s): AST, ALT, ALKPHOS, BILITOT, PROT, ALBUMIN in the last 168 hours. No results for  input(s): LIPASE, AMYLASE in the last 168 hours. No results for input(s): AMMONIA in the last 168 hours. Coagulation Profile: No results for input(s): INR, PROTIME in the last 168 hours. Cardiac Enzymes: No results for input(s): CKTOTAL, CKMB, CKMBINDEX, TROPONINI in the last 168 hours. BNP (last 3 results) No results for input(s): PROBNP in the last 8760 hours. HbA1C: No results for input(s): HGBA1C in the last 72 hours. CBG: Recent Labs  Lab 12/31/19 0800 12/31/19 1217 12/31/19 1616 12/31/19 2139 01/01/20 0730  GLUCAP 228* 275* 93 302* 116*   Lipid Profile: No results for input(s): CHOL, HDL, LDLCALC, TRIG, CHOLHDL, LDLDIRECT in the last 72 hours. Thyroid Function Tests: No results for input(s): TSH, T4TOTAL, FREET4, T3FREE, THYROIDAB in the last 72 hours. Anemia Panel: No results for input(s): VITAMINB12, FOLATE, FERRITIN, TIBC, IRON, RETICCTPCT in the last 72 hours. Urine analysis:  Component Value Date/Time   COLORURINE YELLOW 12/21/2019 1800   APPEARANCEUR CLEAR 12/21/2019 1800   LABSPEC 1.010 12/21/2019 1800   PHURINE 7.0 12/21/2019 1800   GLUCOSEU >=500 (A) 12/21/2019 1800   HGBUR NEGATIVE 12/21/2019 1800   BILIRUBINUR NEGATIVE 12/21/2019 1800   KETONESUR NEGATIVE 12/21/2019 1800   PROTEINUR 30 (A) 12/21/2019 1800   NITRITE NEGATIVE 12/21/2019 1800   LEUKOCYTESUR NEGATIVE 12/21/2019 1800   Sepsis Labs: @LABRCNTIP (procalcitonin:4,lacticacidven:4)  ) Recent Results (from the past 240 hour(s))  Culture, respiratory     Status: None (Preliminary result)   Collection Time: 12/31/19  5:00 PM   Specimen: Tracheal Aspirate  Result Value Ref Range Status   Specimen Description TRACHEAL ASPIRATE  Final   Special Requests NONE  Final   Gram Stain   Final    ABUNDANT WBC PRESENT, PREDOMINANTLY PMN NO ORGANISMS SEEN Performed at [facility], [address] [place], [place] [address]    Culture PENDING  Incomplete   Report Status PENDING  Incomplete          Radiology Studies: No results found.      Scheduled Meds: . amiodarone  200 mg Oral Daily  . apixaban  5 mg Oral BID  . arformoterol  15 mcg Nebulization BID  . atorvastatin  40 mg Oral QPM  . budesonide (PULMICORT) nebulizer solution  0.25 mg Nebulization BID  . chlorhexidine  15 mL Mouth Rinse BID  . Chlorhexidine Gluconate Cloth  6 each Topical Q0600  . docusate  100 mg Oral BID  . famotidine  20 mg Oral BID  . feeding supplement (ENSURE ENLIVE)  237 mL Oral BID BM  . insulin aspart  0-20 Units Subcutaneous TID AC & HS  . levothyroxine  88 mcg Oral QAC breakfast  . mouth rinse  15 mL Mouth Rinse q12n4p  . midodrine  10 mg Oral TID WC  . polyethylene glycol  17 g Oral Daily  . predniSONE  40 mg Oral Q breakfast  . sodium chloride HYPERTONIC  4 mL Nebulization Daily   Continuous Infusions: . sodium chloride Stopped (12/25/19 1942)  . sodium chloride 75 mL/hr at 12/31/19 2041     LOS: 11 days    Time spent: 25 minutes    Edwin Dada, MD [facility] 01/01/2020, 8:13 AM     Please page though [place] or Epic secure chat:  For [employer], Adult nurse

## 2020-01-02 LAB — GLUCOSE, CAPILLARY
Glucose-Capillary: 157 mg/dL — ABNORMAL HIGH (ref 70–99)
Glucose-Capillary: 363 mg/dL — ABNORMAL HIGH (ref 70–99)
Glucose-Capillary: 273 mg/dL — ABNORMAL HIGH (ref 70–99)
Glucose-Capillary: 117 mg/dL — ABNORMAL HIGH (ref 70–99)

## 2020-01-02 MED ORDER — LOPERAMIDE HCL 2 MG PO CAPS
2.0000 mg | ORAL_CAPSULE | ORAL | Status: DC | PRN
  Administered 2020-01-02: 2 mg via ORAL
  Filled 2020-01-02: qty 1

## 2020-01-02 NOTE — Progress Notes (Signed)
Trach education done with son at bedside. Trach care booklet was given to family as well. RT went over cleaning trach, inner cannula, changing trach ties, around skin and trach site. Suctioning. Emergent situations with bagging, changing a trach and suction, and when to call 911. Son was able to repeat back and understood what was taught. Son also stated if he had any questions he would call RT back before he left.

## 2020-01-02 NOTE — Evaluation (Addendum)
Physical Therapy Evaluation Patient Details Name: Jamie Mitchell MRN: [id number] DOB: 05-04-1946 Today's Date: 01/02/2020   History of Present Illness  Pt is 74 yo female with hx of afib, DM, HTN, hypothyroidism, and CVA (feb 2021).  She was admitted with hypoxic resp failure and severe sepsis due to multifocal PNA. 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 Pt was intubated 12/21/19 >>12/27/19, then had tracheostomy.  Clinical Impression   Pt admitted with above diagnosis. Pt was able to ambulate 300' and perform stairs on RA with stable O2 sats.  She did have increased RR/DOE with activity.  Additionally, pt with mild drifting requiring min guard for safety as she fatigued.  Pt with good family support.  Pt making excellent progress considering prolonged hospital stay with intubation and trach placement. Pt currently with functional limitations due to the deficits listed below (see PT Problem List). Pt will benefit from skilled PT to increase their independence and safety with mobility to allow discharge to the venue listed below.    O2 sats rest on RA 100% O2 sats ambulation and stairs on RA 97%     Follow Up Recommendations Home health PT;  Son asked about moisturized air for trach at home (sent Case Manager secure chat)    Equipment Recommendations  None recommended by PT    Recommendations for Other Services       Precautions / Restrictions Precautions Precautions: None Precaution Comments: trach      Mobility  Bed Mobility Overal bed mobility: Needs Assistance Bed Mobility: Supine to Sit;Sit to Supine     Supine to sit: Supervision Sit to supine: Supervision      Transfers Overall transfer level: Needs assistance Equipment used: None Transfers: Sit to/from Stand Sit to Stand: Min guard         General transfer comment: Son has been assisting her up/down in room and  to bathroom.  Pt performed toielting ADLs independently during therapy.  Ambulation/Gait Ambulation/Gait assistance: Min guard Gait Distance (Feet): 300 Feet Assistive device: None Gait Pattern/deviations: Drifts right/left;Step-through pattern Gait velocity: decreased   General Gait Details: Pt with mild drifitng as she fatigued requiring min guard for safety  Stairs Stairs: Yes Stairs assistance: Min guard Stair Management: Alternating pattern;Step to pattern;Forwards;One rail Left (and HHA) Number of Stairs: 10 General stair comments: Performed 10 steps with 1 rail and HHA.  Performed half alternating and educated on step to as she fatigued  Wheelchair Mobility    Modified Rankin (Stroke Patients Only)       Balance Overall balance assessment: Needs assistance Sitting-balance support: No upper extremity supported Sitting balance-Leahy Scale: Good     Standing balance support: No upper extremity supported Standing balance-Leahy Scale: Good                               Pertinent Vitals/Pain Pain Assessment: No/denies pain    Home Living Family/patient expects to be discharged to:: Private residence Living Arrangements: Children;Spouse/significant other Available Help at Discharge: Family;Available 24 hours/day Type of Home: Mobile home Home Access: Stairs to enter Entrance Stairs-Rails: Right;Left;Can reach both Entrance Stairs-Number of Steps: 7 Home Layout: One level Home Equipment: Shower seat      Prior Function Level of Independence: Needs assistance   Gait / Transfers Assistance Needed: Able to ambulate in community  ADL's / Homemaking Assistance Needed: Able to do ADLs  but had assist with IADLs - could do light cleaning  Comments: Has finished therapy after stroke (stroke affected R side and speech Feb 2021)     Hand Dominance        Extremity/Trunk Assessment   Upper Extremity Assessment Upper Extremity Assessment: LUE  deficits/detail;RUE deficits/detail RUE Deficits / Details: ROM WFL; MMT 4/5 LUE Deficits / Details: ROM WFL; MMT 4/5    Lower Extremity Assessment Lower Extremity Assessment: LLE deficits/detail;RLE deficits/detail RLE Deficits / Details: ROM WFL; MMT 4+/5 LLE Deficits / Details: ROM WFL; MMT 4+/5    Cervical / Trunk Assessment Cervical / Trunk Assessment: Normal  Communication   Communication: Prefers language other than [place];Other (comment) (spanish)  Cognition Arousal/Alertness: Awake/alert Behavior During Therapy: WFL for tasks assessed/performed Overall Cognitive Status: Within Functional Limits for tasks assessed                                        General Comments General comments (skin integrity, edema, etc.): Pt on RA with trach collar at arrival.  Her O2 sats were 100% rest.  Maintained 97% with ambulation and stairs.  RR to 32 after stairs but returned to normal with rest.    Exercises     Assessment/Plan    PT Assessment Patient needs continued PT services  PT Problem List Decreased strength;Decreased mobility;Decreased balance;Cardiopulmonary status limiting activity;Decreased activity tolerance       PT Treatment Interventions DME instruction;Therapeutic activities;Gait training;Therapeutic exercise;Patient/family education;Stair training;Balance training;Functional mobility training    PT Goals (Current goals can be found in the Care Plan section)  Acute Rehab PT Goals Patient Stated Goal: return home PT Goal Formulation: With patient/family Time For Goal Achievement: 01/16/20 Potential to Achieve Goals: Good    Frequency Min 3X/week   Barriers to discharge        Co-evaluation               AM-PAC PT "6 Clicks" Mobility  Outcome Measure Help needed turning from your back to your side while in a flat bed without using bedrails?: None Help needed moving from lying on your back to sitting on the side of a flat bed without  using bedrails?: None Help needed moving to and from a bed to a chair (including a wheelchair)?: None Help needed standing up from a chair using your arms (e.g., wheelchair or bedside chair)?: None Help needed to walk in hospital room?: None Help needed climbing 3-5 steps with a railing? : None 6 Click Score: 24    End of Session Equipment Utilized During Treatment: Gait belt Activity Tolerance: Patient tolerated treatment well Patient left: in chair;with family/visitor present Nurse Communication: Mobility status (no O2 needs) PT Visit Diagnosis: Unsteadiness on feet (R26.81)    Time: 3568-6168 PT Time Calculation (min) (ACUTE ONLY): 20 min   Charges:   PT Evaluation $PT Eval Moderate Complexity: 1 Mod          [place], PT Acute Rehab Services Pager [phone] [facility] [phone]    Karlton Lemon 01/02/2020, 5:23 PM

## 2020-01-02 NOTE — Progress Notes (Signed)
Inpatient Diabetes Program Recommendations  AACE/ADA: New Consensus Statement on Inpatient Glycemic Control (2015)  Target Ranges:  Prepandial:   less than 140 mg/dL      Peak postprandial:   less than 180 mg/dL (1-2 hours)      Critically ill patients:  140 - 180 mg/dL   Lab Results  Component Value Date   GLUCAP 157 (H) 01/02/2020   HGBA1C 7.1 (H) 12/02/2019    Review of Glycemic Control Results for Jamie Mitchell, Jamie Mitchell (MRN [id number]) as of 01/02/2020 09:10  Ref. Range 01/01/2020 07:30 01/01/2020 12:08 01/01/2020 16:12 01/01/2020 21:20 01/02/2020 08:13  Glucose-Capillary Latest Ref Range: 70 - 99 mg/dL 116 (H) 304 (H) 275 (H) 173 (H) 157 (H)   Diabetes history: DM 2 Outpatient Diabetes medications: metformin 500 mg bid Current orders for Inpatient glycemic control:  Novolog 0-20 units tid and hs  Ensure Enlive bid between meals PO prednisone 40 mg Daily  Inpatient Diabetes Program Recommendations:    If pt remains on current steroid dose, may consider Novolog 4 units tid meal coverage if eating >50% of meals.  Thanks,  Tama Headings RN, MSN, BC-ADM Inpatient Diabetes Coordinator Team Pager [phone] (8a-5p)

## 2020-01-02 NOTE — Progress Notes (Signed)
PROGRESS NOTE    Jamie Mitchell  DJS:[id number] DOB: [birth year] DOA: 12/21/2019 PCP: Charlott Rakes, MD      Brief Narrative:  [name] is a 74 y.o. F with Afib, DM, HTN, hypothyroidism, and h/o CVA was admitted 12/21/19 for acute hypoxic respiratory failure secondary to multifocal pneumonia. 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    Assessment & Plan:  Disposition Is ready for discharge and is awaiting completion of trach teaching to patient and family Also awaiting PT evaluation to see if she is safe to go home versus recommendations for rehab. Once trach teaching and PT are done, patient is ready for discharge  Acute hypoxic respiratory failure and severe sepsis due to multifocal pneumonia Patient has responded well to prolonged treatment for multifocal pneumonia with cefepime.  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 -Continue prednisone and taper at discharge -Continue ensure -SLP -Trach teaching -DME supplies are in place  Orthostatic hypotension BP normal during the day on midodrine Nursing note that she she is off midodrine at night, she has orthostatic symptoms and low BP with sitting up. -Reduce midodrine -Add fluorinef -Ted hose  Paroxysmal atrial fibrillation Rate controlled -Continue amiodarone -Continue apixaban  Hypertension Stroke, secondary prevention BP had been low requiring midodrine BP normal now -Continue midodrine  Diabetes, type II without complication, uncontrolled without long-term insulin use Glucose controlled -Hold metformin -Continue SS corrections  Hypothyroidism -Continue levothyroxine   MDM: The below labs and imaging reports were reviewed and summarized above.  Medication management  as above.   DVT prophylaxis: SCDs Start: 12/21/19 1949 apixaban (ELIQUIS) tablet 5 mg  Code Status: FULL Family Communication: Son at bedside    Consultants:   PCCM  ENT  Procedures:   8/4 tracheostomy  Antimicrobials:   Azithromycin and ceftriaxone 7/29 >> 8/3   Culture data:   7/29 blood culture x2 NG  7/29 urine culture NG    Subjective:  Patient seen in conjunction with her son.  She states she is doing okay.  Has no complaints or questions.  Son would like for patient to go home but he is agreeable to PT evaluation to see if she has any DME needs before she goes home.  He really would rather not have her go to rehab.  Objective: Vitals:   01/02/20 0814 01/02/20 1159 01/02/20 1607 01/02/20 1618  BP: (!) 105/59  98/73   Pulse: 71 73 65 70  Resp:  18 14 18   Temp: 98.1 F (36.7 C)  98.9 F (37.2 C)   TempSrc:      SpO2: 98% 96% 99% 97%  Weight:      Height:        Intake/Output Summary (Last 24 hours) at 01/02/2020 1857 Last data filed at 01/01/2020 2208 Gross per 24 hour  Intake 240 ml  Output 0 ml  Net 240 ml   Filed Weights   12/30/19 0500 12/31/19 0500 01/01/20 0500  Weight: 64.4 kg 64.3 kg 66 kg   Physical Exam: Blood pressure 98/73, pulse 70, temperature 98.9 F (37.2 C), resp. rate 18, height 4\' 11"  (1.499 m), weight 66 kg, SpO2 97 %. Gen: Early female sitting up in bed with trach in place in no acute distress with attentive son at bedside Eyes: sclera anicteric, conjuctiva mildly injected bilaterally CVS: S1-S2, regulary, no gallops Respiratory:  decreased air entry likely secondary to decreased inspiratory effort GI: NABS, soft, NT  LE: No edema. No cyanosis Neuro: grossly nonfocal.    Data Reviewed: I have personally reviewed following labs and imaging studies:  CBC: Recent Labs  Lab 12/27/19 0142 12/28/19 0406 12/29/19 0330 12/30/19 0142 12/31/19 0148  WBC 11.1* 15.9* 11.9* 11.1* 8.5  HGB 11.3* 11.8* 12.3 11.6* 12.7  HCT  34.7* 36.4 37.6 35.6* 39.1  MCV 88.1 91.5 87.9 88.3 87.5  PLT 239 265 257 269 027   Basic Metabolic Panel: Recent Labs  Lab 12/28/19 0406 12/29/19 0330 12/30/19 0142 12/31/19 0148 01/01/20 0153  NA 138 136 135 136 135  K 4.8 3.9 4.1 4.2 4.3  CL 105 101 103 104 106  CO2 21* 24 20* 24 19*  GLUCOSE 115* 128* 160* 116* 189*  BUN 21 15 25* 12 20  CREATININE 0.91 0.74 0.82 0.78 0.95  CALCIUM 7.9* 8.4* 7.9* 8.4* 8.3*   GFR: Estimated Creatinine Clearance: 44.8 mL/min (by C-G formula based on SCr of 0.95 mg/dL). Liver Function Tests: No results for input(s): AST, ALT, ALKPHOS, BILITOT, PROT, ALBUMIN in the last 168 hours. No results for input(s): LIPASE, AMYLASE in the last 168 hours. No results for input(s): AMMONIA in the last 168 hours. Coagulation Profile: No results for input(s): INR, PROTIME in the last 168 hours. Cardiac Enzymes: No results for input(s): CKTOTAL, CKMB, CKMBINDEX, TROPONINI in the last 168 hours. BNP (last 3 results) No results for input(s): PROBNP in the last 8760 hours. HbA1C: No results for input(s): HGBA1C in the last 72 hours. CBG: Recent Labs  Lab 01/01/20 1612 01/01/20 2120 01/02/20 0813 01/02/20 1211 01/02/20 1604  GLUCAP 275* 173* 157* 363* 273*   Lipid Profile: No results for input(s): CHOL, HDL, LDLCALC, TRIG, CHOLHDL, LDLDIRECT in the last 72 hours. Thyroid Function Tests: No results for input(s): TSH, T4TOTAL, FREET4, T3FREE, THYROIDAB in the last 72 hours. Anemia Panel: No results for input(s): VITAMINB12, FOLATE, FERRITIN, TIBC, IRON, RETICCTPCT in the last 72 hours. Urine analysis:    Component Value Date/Time   COLORURINE YELLOW 12/21/2019 1800   APPEARANCEUR CLEAR 12/21/2019 1800   LABSPEC 1.010 12/21/2019 1800   PHURINE 7.0 12/21/2019 1800   GLUCOSEU >=500 (A) 12/21/2019 1800   HGBUR NEGATIVE 12/21/2019 1800   BILIRUBINUR NEGATIVE 12/21/2019 1800   KETONESUR NEGATIVE 12/21/2019 1800   PROTEINUR 30 (A) 12/21/2019 1800    NITRITE NEGATIVE 12/21/2019 1800   LEUKOCYTESUR NEGATIVE 12/21/2019 1800   Sepsis Labs: @LABRCNTIP (procalcitonin:4,lacticacidven:4)  ) Recent Results (from the past 240 hour(s))  Culture, respiratory     Status: None (Preliminary result)   Collection Time: 12/31/19  5:00 PM   Specimen: Tracheal Aspirate  Result Value Ref Range Status   Specimen Description TRACHEAL ASPIRATE  Final   Special Requests NONE  Final   Gram Stain   Final    ABUNDANT WBC PRESENT, PREDOMINANTLY PMN NO ORGANISMS SEEN    Culture   Final    RARE Normal respiratory flora-no Staph aureus or Pseudomonas seen Performed at [facility], [address] [place], [place] [address]    Report Status PENDING  Incomplete         Radiology Studies: No results found.      Scheduled Meds: . amiodarone  200 mg Oral Daily  . apixaban  5 mg Oral BID  . arformoterol  15 mcg Nebulization BID  . atorvastatin  40 mg Oral QPM  . budesonide (PULMICORT) nebulizer solution  0.25  mg Nebulization BID  . chlorhexidine  15 mL Mouth Rinse BID  . Chlorhexidine Gluconate Cloth  6 each Topical Q0600  . famotidine  20 mg Oral BID  . feeding supplement (ENSURE ENLIVE)  237 mL Oral BID BM  . fludrocortisone  0.1 mg Oral BID  . insulin aspart  0-20 Units Subcutaneous TID AC & HS  . levothyroxine  88 mcg Oral QAC breakfast  . mouth rinse  15 mL Mouth Rinse q12n4p  . midodrine  5 mg Oral TID WC  . polyethylene glycol  17 g Oral Daily  . predniSONE  40 mg Oral Q breakfast   Continuous Infusions:    LOS: 12 days    Time spent: 25 minutes    Vashti Hey, MD [facility] 01/02/2020, 6:57 PM     Please page though [facility] or Epic secure chat:  For [employer], Adult nurse

## 2020-01-02 NOTE — Progress Notes (Addendum)
  Speech Language Pathology Treatment: Dysphagia;Passy Muir Speaking valve  Patient Details Name: Jamie Mitchell MRN: [id number] DOB: 1945-10-20 Today's Date: 01/02/2020 Time: 6808-8110 SLP Time Calculation (min) (ACUTE ONLY): 20 min  Assessment / Plan / Recommendation Clinical Impression  Pt has not been able to tolerate PMV with cuffed trach thus far and. SLP was told that trach was not cuffless, even though cuffed is documented in trach section. Arrived to room with pt's son present and her trach is cuffed. Taking a deep breath she is able to transit exhaled air to vocal cords producing slight vocalization. PMV placed and after 10 seconds it was evident from pt's behavior that air exchange was insufficient. Large amount of exhaled air present when valve removed reinforcing decreased air flow to upper airway. Son reports trach was supposed to be switched to cuffless yesterday. ST will need to see once trach is cuffless prior to being discharged from hospital.  Pt and son report there have been no difficulties with her swallowing. Son was able to state reflux precautions accurately and overall, has a very good understanding of pt's trach and reflux precautions. Will continue therapy with valve when trach is cuffless.    HPI HPI: Pt is a 74 year old woman suffers from recurrent stridor and subglottic edema/stinosis presents with acute hypoxic respiratory failure resulting in intubation by EMS. Trach placed by ENT on 8/4.  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  During the hospitalization she underwent IR thrombectomy for which she was intubated for duration of 3 days.  After leaving the hospital her son reports she had dysphonia but no stridor. She was readmitted 0n 10/31/2019 with stridor, ENT exam showed subglottic swelling, confirmed by CT, discharged with a steriod taper. Was reporting delayed regurgitation of PO at that time, esophagram showed Gastroesophageal  reflux andBorderline appearance for nonspecific esophageal dysmotility, given the presence of proximal escape and initial secondary and tertiary contractions. Readmitted on 6/25 with similar symptoms, MBS at that time showed Mild oral dysphgia with delayed transit of boluses "No reflux or retrograde movement of boluses observed, however patient did exhibit s/s of some SOB/gasping after successive PO's, as well as instances of belching/gagging but without regurgitation." Primary complaint has been globus. Pt was tolerating mech soft diet at the time of d/c.       SLP Plan  Continue with current plan of care       Recommendations  Diet recommendations: Dysphagia 3 (mechanical soft);Thin liquid Liquids provided via: Cup;Straw Medication Administration: Whole meds with puree Compensations: Slow rate;Small sips/bites Postural Changes and/or Swallow Maneuvers: Seated upright 90 degrees;Upright 30-60 min after meal      Patient may use Passy-Muir Speech Valve: with SLP only PMSV Supervision: Full MD: Please consider changing trach tube to : Smaller size;Cuffless         Oral Care Recommendations: Oral care BID Follow up Recommendations:  (possibly outpatient if not seen prior to d/c) SLP Visit Diagnosis: Dysphagia, unspecified (R13.10);Aphonia (R49.1) Plan: Continue with current plan of care       GO                Jamie Mitchell 01/02/2020, 4:38 PM   Jamie Mitchell Jamie [email or web address].Ed Risk analyst [phone] Office [phone]

## 2020-01-02 NOTE — TOC Progression Note (Addendum)
Transition of Care [facility]) - Progression Note    Patient Details  Name: Jamie Mitchell MRN: [id number] Date of Birth: Mar 01, 1946  Transition of Care [facility]) CM/SW [place], RN Phone Number: [phone]  01/02/2020, 3:26 PM  Clinical Narrative:    CM consulted for patient discharging home with new trach. Safe discharge plan will include Home oxygen saturation assessment, PT/OT evaluation to determine the most appropriate level of care, family education for trach, trach supplies will be shipped once patient is discharged but the patient will need at least 3 days supply prior to discharge, this supply should include 1 trach of the same size and 1 trach 1 size smaller. Patient must be set up with appointment to follow up with the trach clinic. [facility] services should be set up according to PT/OT recommendations. Once it is determined that the patient is safe for discharge  necessary equipment setup will be initiated. Currently awaiting PT /OT evaluation.  CM will continue to follow and assist with discharge needs.   Expected Discharge Plan: Home/Self Care Barriers to Discharge: Continued Medical Work up  Expected Discharge Plan and Services Expected Discharge Plan: Home/Self Care   Discharge Planning Services: CM Consult   Living arrangements for the past 2 months: Single Family Home                                       Social Determinants of Health (SDOH) Interventions    Readmission Risk Interventions No flowsheet data found.

## 2020-01-03 ENCOUNTER — Inpatient Hospital Stay: Payer: Medicare Other | Admitting: Family Medicine

## 2020-01-03 DIAGNOSIS — I951 Orthostatic hypotension: Secondary | ICD-10-CM

## 2020-01-03 DIAGNOSIS — J690 Pneumonitis due to inhalation of food and vomit: Secondary | ICD-10-CM

## 2020-01-03 DIAGNOSIS — Z8673 Personal history of transient ischemic attack (TIA), and cerebral infarction without residual deficits: Secondary | ICD-10-CM

## 2020-01-03 DIAGNOSIS — E1165 Type 2 diabetes mellitus with hyperglycemia: Secondary | ICD-10-CM

## 2020-01-03 DIAGNOSIS — Z9889 Other specified postprocedural states: Secondary | ICD-10-CM

## 2020-01-03 LAB — CULTURE, RESPIRATORY W GRAM STAIN: Culture: NORMAL

## 2020-01-03 LAB — GLUCOSE, CAPILLARY
Glucose-Capillary: 144 mg/dL — ABNORMAL HIGH (ref 70–99)
Glucose-Capillary: 319 mg/dL — ABNORMAL HIGH (ref 70–99)
Glucose-Capillary: 204 mg/dL — ABNORMAL HIGH (ref 70–99)
Glucose-Capillary: 144 mg/dL — ABNORMAL HIGH (ref 70–99)

## 2020-01-03 LAB — HEMOGLOBIN A1C
Hgb A1c MFr Bld: 7.8 % — ABNORMAL HIGH (ref 4.8–5.6)
Mean Plasma Glucose: 177.16 mg/dL

## 2020-01-03 MED ORDER — AMIODARONE HCL 200 MG PO TABS: 200.0000 mg | ORAL_TABLET | Freq: Every day | ORAL | 1 refills | Status: DC

## 2020-01-03 MED ORDER — PREDNISONE 5 MG PO TABS: 5.0000 mg | ORAL_TABLET | Freq: Every day | ORAL | Status: DC

## 2020-01-03 MED ORDER — PREDNISONE 20 MG PO TABS: 20.0000 mg | ORAL_TABLET | Freq: Every day | ORAL | Status: DC

## 2020-01-03 MED ORDER — MIDODRINE HCL 5 MG PO TABS: 5.0000 mg | ORAL_TABLET | Freq: Three times a day (TID) | ORAL | 1 refills | Status: DC

## 2020-01-03 MED ORDER — BUDESONIDE 0.25 MG/2ML IN SUSP: 0.2500 mg | Freq: Two times a day (BID) | RESPIRATORY_TRACT | 2 refills | Status: DC

## 2020-01-03 MED ORDER — METFORMIN HCL 500 MG PO TABS: 1000.0000 mg | ORAL_TABLET | Freq: Two times a day (BID) | ORAL | 1 refills | Status: DC

## 2020-01-03 MED ORDER — PREDNISONE 10 MG PO TABS: 10.0000 mg | ORAL_TABLET | Freq: Every day | ORAL | Status: DC

## 2020-01-03 MED ORDER — IPRATROPIUM-ALBUTEROL 0.5-2.5 (3) MG/3ML IN SOLN: 3.0000 mL | Freq: Four times a day (QID) | RESPIRATORY_TRACT | 2 refills | Status: DC | PRN

## 2020-01-03 MED ORDER — PREDNISONE 20 MG PO TABS
30.0000 mg | ORAL_TABLET | Freq: Every day | ORAL | Status: DC
  Administered 2020-01-04: 30 mg via ORAL
  Filled 2020-01-03: qty 1

## 2020-01-03 MED ORDER — PREDNISONE 10 MG PO TABS: ORAL_TABLET | ORAL | 0 refills | Status: DC

## 2020-01-03 MED ORDER — FLUDROCORTISONE ACETATE 0.1 MG PO TABS: 0.1000 mg | ORAL_TABLET | Freq: Two times a day (BID) | ORAL | 1 refills | Status: DC

## 2020-01-03 NOTE — Progress Notes (Signed)
PROGRESS NOTE  Jamie Mitchell YWV:[id number] DOB: February 13, 1946   PCP: Charlott Rakes, MD  Patient is from: Home  DOA: 12/21/2019 LOS: 35  Brief Narrative / Interim history: 74 y.o. Spanish-speaking female with PMH of CVA, Afib, DM, HTN and hypothyroidism admitted 12/21/19 for acute hypoxic respiratory failure secondary to multifocal pneumonia. 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   She underwent tracheostomy on 8/4 by ENT.   Plan is to downsize to a #4 cuffless per ENT recommendation and discharge home.  Patient and family has  received trach care.   Subjective: Seen and examined earlier this morning.  No major events overnight of this morning.  No complaints.  She denies chest pain, dyspnea, GI or UTI symptoms.  She is very eager to go home.  Patient's son at bedside.  Video interpreter with ID number [id number] utilized for this encounter.  Objective: Vitals:   01/03/20 0404 01/03/20 0734 01/03/20 1028 01/03/20 1402  BP:  95/68    Pulse: 86 78 64 68  Resp: 18 19 20 20   Temp:  98 F (36.7 C)    TempSrc:  Oral    SpO2: 97% 97% 97% 97%  Weight:      Height:        Intake/Output Summary (Last 24 hours) at 01/03/2020 1530 Last data filed at 01/02/2020 2322 Gross per 24 hour  Intake 240 ml  Output 0 ml  Net 240 ml   Filed Weights   12/30/19 0500 12/31/19 0500 01/01/20 0500  Weight: 64.4 kg 64.3 kg 66 kg    Examination:  GENERAL: No apparent distress.  Nontoxic. HEENT: MMM.  Vision and hearing grossly intact.  NECK: Tracheostomy in place. RESP:  No IWOB.  Fair aeration bilaterally. CVS:  RRR. Heart sounds normal.  ABD/GI/GU: BS+. Abd soft, NTND.  MSK/EXT:  Moves extremities. No apparent deformity. No edema.  SKIN: no apparent skin lesion or wound NEURO: Awake, alert and oriented appropriately.  No apparent focal neuro deficit. PSYCH: Calm.  Normal affect.   Procedures:  80/4-tracheostomy by ENT.  Microbiology summarized: 7/29-blood cultures NGTD 7/29-urine cultures NGTD  Assessment & Plan: Acute hypoxic respiratory failure with hypoxia and hypercarbia  Severe sepsis due to multifocal pneumonia Subglottic stenosis/recurrent glottic edema -Completed antibiotic course. -Tracheostomy on 8/4.  Tolerated cuffless, #4 today. -Family received trach care teaching -Taper steroid. -Outpatient follow-up with ENT in place  Orthostatic hypotension: BP soft but normal range. -Continue low-dose midodrine -Florinef added 4 days ago-adrenal insufficiency? Her K and Na don't suggest.  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  On amiodarone and Eliquis at home -Continue amiodarone -Continue apixaban  Uncontrolled DM-2 with hyperglycemia: A1c 7.8%.  Hyperglycemia could be due to steroid.  On Metformin at home. Recent Labs  Lab 01/02/20 1604 01/02/20 2130 01/03/20 0728 01/03/20 1124 01/03/20 1637  GLUCAP 273* 117* 144* 319* 204*  -Continue SSI -Start tapering steroid -Continue statin  Hypothyroidism:  -Continue levothyroxine  Language barrier affecting communication -Video interpreter utilized for this encounter  Body mass index is 29.39 kg/m. Nutrition Problem: Inadequate oral intake Etiology: inability to eat Signs/Symptoms: NPO status Interventions: Ensure Enlive (each supplement provides 350kcal and 20 grams of protein), Magic cup   DVT prophylaxis:  SCDs Start: 12/21/19 1949 apixaban (ELIQUIS) tablet 5  mg  Code Status: Full code Family Communication: Updated patient's son at bedside..  Status is: Inpatient  Remains inpatient appropriate because:Inpatient level of care appropriate due to severity of illness to observe on  cuffless trach   Dispo:  Patient From: Home  Planned Disposition: Home  Expected discharge date: 01/04/2020   Medically stable for discharge: No        Consultants:  PCCM ENT   Sch Meds:  Scheduled Meds: . amiodarone  200 mg Oral Daily  . apixaban  5 mg Oral BID  . arformoterol  15 mcg Nebulization BID  . atorvastatin  40 mg Oral QPM  . budesonide (PULMICORT) nebulizer solution  0.25 mg Nebulization BID  . chlorhexidine  15 mL Mouth Rinse BID  . Chlorhexidine Gluconate Cloth  6 each Topical Q0600  . famotidine  20 mg Oral BID  . feeding supplement (ENSURE ENLIVE)  237 mL Oral BID BM  . fludrocortisone  0.1 mg Oral BID  . insulin aspart  0-20 Units Subcutaneous TID AC & HS  . levothyroxine  88 mcg Oral QAC breakfast  . mouth rinse  15 mL Mouth Rinse q12n4p  . midodrine  5 mg Oral TID WC  . polyethylene glycol  17 g Oral Daily  . predniSONE  40 mg Oral Q breakfast   Continuous Infusions: PRN Meds:.acetaminophen, fentaNYL (SUBLIMAZE) injection, levalbuterol, loperamide, polyethylene glycol  Antimicrobials: Anti-infectives (From admission, onward)   Start     Dose/Rate Route Frequency Ordered Stop   12/22/19 1845  vancomycin (VANCOCIN) IVPB 1000 mg/200 mL premix  Status:  Discontinued       "Followed by" Linked Group Details   1,000 mg 200 mL/hr over 60 Minutes Intravenous Every 24 hours 12/21/19 1842 12/21/19 2145   12/22/19 1800  cefTRIAXone (ROCEPHIN) 2 g in sodium chloride 0.9 % 100 mL IVPB        2 g 200 mL/hr over 30 Minutes Intravenous Every 24 hours 12/21/19 2145 12/26/19 1905   12/22/19 1800  azithromycin (ZITHROMAX) 500 mg in sodium chloride 0.9 % 250 mL IVPB  Status:  Discontinued        500 mg 250 mL/hr over 60 Minutes Intravenous Every 24 hours 12/21/19 2145 12/26/19 0757   12/21/19 2100  ceFEPIme (MAXIPIME) 2 g in sodium chloride 0.9 % 100 mL IVPB  Status:  Discontinued        2 g 200 mL/hr over 30 Minutes Intravenous Every 12 hours 12/21/19 2049  12/21/19 2145   12/21/19 1845  vancomycin (VANCOREADY) IVPB 1500 mg/300 mL  Status:  Discontinued       "Followed by" Linked Group Details   1,500 mg 150 mL/hr over 120 Minutes Intravenous  Once 12/21/19 1842 12/21/19 2145   12/21/19 1815  cefTRIAXone (ROCEPHIN) 1 g in sodium chloride 0.9 % 100 mL IVPB        1 g 200 mL/hr over 30 Minutes Intravenous  Once 12/21/19 1803 12/21/19 2029   12/21/19 1815  azithromycin (ZITHROMAX) 500 mg in sodium chloride 0.9 % 250 mL IVPB  Status:  Discontinued        500 mg 250 mL/hr over 60 Minutes Intravenous  Once 12/21/19 1803 12/21/19 2100       I have personally reviewed the following labs and images: CBC: Recent Labs  Lab 12/28/19 0406 12/29/19 0330 12/30/19 0142 12/31/19 0148  WBC 15.9* 11.9* 11.1* 8.5  HGB 11.8* 12.3 11.6* 12.7  HCT 36.4 37.6 35.6* 39.1  MCV 91.5 87.9 88.3 87.5  PLT 265 257 269 283   BMP &GFR Recent Labs  Lab 12/28/19 0406 12/29/19 0330 12/30/19 0142 12/31/19 0148 01/01/20 0153  NA 138 136 135 136 135  K 4.8 3.9 4.1 4.2 4.3  CL 105 101 103 104 106  CO2 21* 24 20* 24 19*  GLUCOSE 115* 128* 160* 116* 189*  BUN 21 15 25* 12 20  CREATININE 0.91 0.74 0.82 0.78 0.95  CALCIUM 7.9* 8.4* 7.9* 8.4* 8.3*   Estimated Creatinine Clearance: 44.8 mL/min (by C-G formula based on SCr of 0.95 mg/dL). Liver & Pancreas: No results for input(s): AST, ALT, ALKPHOS, BILITOT, PROT, ALBUMIN in the last 168 hours. No results for input(s): LIPASE, AMYLASE in the last 168 hours. No results for input(s): AMMONIA in the last 168 hours. Diabetic: Recent Labs    01/03/20 0950  HGBA1C 7.8*   Recent Labs  Lab 01/02/20 1211 01/02/20 1604 01/02/20 2130 01/03/20 0728 01/03/20 1124  GLUCAP 363* 273* 117* 144* 319*   Cardiac Enzymes: No results for input(s): CKTOTAL, CKMB, CKMBINDEX, TROPONINI in the last 168 hours. No results for input(s): PROBNP in the last 8760 hours. Coagulation Profile: No results for input(s): INR, PROTIME  in the last 168 hours. Thyroid Function Tests: No results for input(s): TSH, T4TOTAL, FREET4, T3FREE, THYROIDAB in the last 72 hours. Lipid Profile: No results for input(s): CHOL, HDL, LDLCALC, TRIG, CHOLHDL, LDLDIRECT in the last 72 hours. Anemia Panel: No results for input(s): VITAMINB12, FOLATE, FERRITIN, TIBC, IRON, RETICCTPCT in the last 72 hours. Urine analysis:    Component Value Date/Time   COLORURINE YELLOW 12/21/2019 1800   APPEARANCEUR CLEAR 12/21/2019 1800   LABSPEC 1.010 12/21/2019 1800   PHURINE 7.0 12/21/2019 1800   GLUCOSEU >=500 (A) 12/21/2019 1800   HGBUR NEGATIVE 12/21/2019 1800   BILIRUBINUR NEGATIVE 12/21/2019 1800   KETONESUR NEGATIVE 12/21/2019 1800   PROTEINUR 30 (A) 12/21/2019 1800   NITRITE NEGATIVE 12/21/2019 1800   LEUKOCYTESUR NEGATIVE 12/21/2019 1800   Sepsis Labs: Invalid input(s): PROCALCITONIN, [place]  Microbiology: Recent Results (from the past 240 hour(s))  Culture, respiratory     Status: None   Collection Time: 12/31/19  5:00 PM   Specimen: Tracheal Aspirate  Result Value Ref Range Status   Specimen Description TRACHEAL ASPIRATE  Final   Special Requests NONE  Final   Gram Stain   Final    ABUNDANT WBC PRESENT, PREDOMINANTLY PMN NO ORGANISMS SEEN    Culture   Final    RARE Normal respiratory flora-no Staph aureus or Pseudomonas seen Performed at [facility], [address] [place], [place] [address]    Report Status 01/03/2020 FINAL  Final    Radiology Studies: No results found.    Crissy Mccreadie T. Owen  If 7PM-7AM, please contact night-coverage [email or web address] Password [facility] 01/03/2020, 3:30 PM

## 2020-01-03 NOTE — Consult Note (Signed)
[facility] CM Inpatient Consult   01/03/2020  Sumi Vavrek 08/17/1945 [id number]   Follow up: >10 days hospitalization  Patient is for transitioning home.  Met with patient and son, [name],  at the bedside. Patient with s/p tracheostomy, with multifocal pneumonia.  Son states " trach changed today and that she states she is doing much better now with the trach and she is ready to go home."  She is currently for home.  PT/OT notes recommended home health.    Plan: Will reach out to inpatient Transition of Care team that [facility] RNCM to follow post hospital.  Will update [facility] RN Care Coordinator of disposition and ongoing follow up needs when patient transitions..  For questions, please contact:  Victoria Brewer, RN BSN CCM [facility] Liaison  [phone] business mobile phone Toll free office [phone]  Fax number: [phone] [email or web address]   

## 2020-01-03 NOTE — Care Management Important Message (Signed)
Important Message  Patient Details  Name: Jamie Mitchell MRN: [id number] Date of Birth: 18-Apr-1946   Medicare Important Message Given:  Yes     Orbie Pyo 01/03/2020, 3:24 PM

## 2020-01-03 NOTE — Procedures (Signed)
Tracheostomy Change Note  Patient Details:   Name: Jamie Mitchell DOB: [birth year] MRN: [id number]    Airway Documentation:    Evaluation  O2 sats: stable throughout Complications: No apparent complications Patient did tolerate procedure well. Bilateral Breath Sounds: Clear, Diminished   Trach sutures were removed and the #6 Shiley cuffed tracheostomy tube was replaced with a #4 cuffless Shiley tube.  After tube was changed, positive CO2 return noted with EZ Cap co2 detector, bilateral breath sounds were clear and I was able to pass a suction catheter into the airway without difficulty.  Pt's o2 saturations were 96% or greater on room air throughout the procedure.  [name] was at bedside.     Earney Navy 01/03/2020, 3:04 PM

## 2020-01-03 NOTE — Progress Notes (Signed)
Inpatient Diabetes Program Recommendations  AACE/ADA: New Consensus Statement on Inpatient Glycemic Control (2015)  Target Ranges:  Prepandial:   less than 140 mg/dL      Peak postprandial:   less than 180 mg/dL (1-2 hours)      Critically ill patients:  140 - 180 mg/dL   Lab Results  Component Value Date   GLUCAP 319 (H) 01/03/2020   HGBA1C 7.8 (H) 01/03/2020    Review of Glycemic Control Results for RIANNA, LUKES (MRN [id number]) as of 01/03/2020 12:32  Ref. Range 01/02/2020 08:13 01/02/2020 12:11  Glucose-Capillary Latest Ref Range: 70 - 99 mg/dL 157 (H) 363 (H)   Results for TAIJAH, MACRAE (MRN [id number]) as of 01/03/2020 12:32  Ref. Range 01/03/2020 07:28 01/03/2020 11:24  Glucose-Capillary Latest Ref Range: 70 - 99 mg/dL 144 (H) 319 (H)    Inpatient Diabetes Program Recommendations:    Post prandials elevated.  Please consider Novolog 4 units if eats at least 50% of meals   Will continue to follow while inpatient.  Thank you, Reche Dixon, RN, BSN Diabetes Coordinator Inpatient Diabetes Program [phone] (team pager from 8a-5p)

## 2020-01-03 NOTE — TOC Progression Note (Signed)
Transition of Care [facility]) - Progression Note    Patient Details  Name: Jamie Mitchell MRN: [id number] Date of Birth: Jun 03, 1945  Transition of Care [facility]) CM/SW [place], RN Phone Number: [phone] 01/03/2020, 10:58 AM  Clinical Narrative:   CM received report from bedside nurse Tammi Klippel who reports that patient has a cuffed trach. CM spoke with RT who verified that patient still has original cuffed trach with sutures in place. RT stated that MD has been made aware that ENT will need to be consulted to change trach to a cuffless. Patient currently is not ready for discharge. TOC will continue to follow.    Expected Discharge Plan: Home/Self Care Barriers to Discharge: Continued Medical Work up  Expected Discharge Plan and Services Expected Discharge Plan: Home/Self Care   Discharge Planning Services: CM Consult   Living arrangements for the past 2 months: Single Family Home Expected Discharge Date: 01/03/20                                     Social Determinants of Health (SDOH) Interventions    Readmission Risk Interventions No flowsheet data found.

## 2020-01-04 ENCOUNTER — Other Ambulatory Visit (HOSPITAL_COMMUNITY): Payer: Self-pay | Admitting: Student

## 2020-01-04 DIAGNOSIS — E039 Hypothyroidism, unspecified: Secondary | ICD-10-CM

## 2020-01-04 DIAGNOSIS — Z789 Other specified health status: Secondary | ICD-10-CM

## 2020-01-04 LAB — RENAL FUNCTION PANEL
Albumin: 2.7 g/dL — ABNORMAL LOW (ref 3.5–5.0)
Anion gap: 8 (ref 5–15)
BUN: 28 mg/dL — ABNORMAL HIGH (ref 8–23)
CO2: 25 mmol/L (ref 22–32)
Calcium: 8.5 mg/dL — ABNORMAL LOW (ref 8.9–10.3)
Chloride: 102 mmol/L (ref 98–111)
Creatinine, Ser: 1 mg/dL (ref 0.44–1.00)
GFR calc Af Amer: >60 mL/min (ref >60)
GFR calc non Af Amer: 57 mL/min — ABNORMAL LOW (ref >60)
Glucose, Bld: 98 mg/dL (ref 70–99)
Phosphorus: 3.6 mg/dL (ref 2.5–4.6)
Potassium: 3.9 mmol/L (ref 3.5–5.1)
Sodium: 135 mmol/L (ref 135–145)

## 2020-01-04 LAB — CBC
HCT: 33.8 % — ABNORMAL LOW (ref 36.0–46.0)
Hemoglobin: 11 g/dL — ABNORMAL LOW (ref 12.0–15.0)
MCH: 29 pg (ref 26.0–34.0)
MCHC: 32.5 g/dL (ref 30.0–36.0)
MCV: 89.2 fL (ref 80.0–100.0)
Platelets: 326 10*3/uL (ref 150–400)
RBC: 3.79 MIL/uL — ABNORMAL LOW (ref 3.87–5.11)
RDW: 18.3 % — ABNORMAL HIGH (ref 11.5–15.5)
WBC: 13.9 10*3/uL — ABNORMAL HIGH (ref 4.0–10.5)
nRBC: 0.1 % (ref 0.0–0.2)

## 2020-01-04 LAB — GLUCOSE, CAPILLARY
Glucose-Capillary: 130 mg/dL — ABNORMAL HIGH (ref 70–99)
Glucose-Capillary: 307 mg/dL — ABNORMAL HIGH (ref 70–99)
Glucose-Capillary: 277 mg/dL — ABNORMAL HIGH (ref 70–99)

## 2020-01-04 LAB — MAGNESIUM: Magnesium: 2.4 mg/dL (ref 1.7–2.4)

## 2020-01-04 MED ORDER — PREDNISONE 10 MG PO TABS
ORAL_TABLET | ORAL | 0 refills | Status: DC
Start: 2020-01-04 — End: 2020-01-15

## 2020-01-04 MED ORDER — IPRATROPIUM-ALBUTEROL 0.5-2.5 (3) MG/3ML IN SOLN: 3.0000 mL | Freq: Four times a day (QID) | RESPIRATORY_TRACT | 2 refills | Status: DC | PRN

## 2020-01-04 MED ORDER — AMIODARONE HCL 200 MG PO TABS: 200.0000 mg | ORAL_TABLET | Freq: Every day | ORAL | 1 refills | Status: DC

## 2020-01-04 MED ORDER — BUDESONIDE 0.25 MG/2ML IN SUSP: 0.2500 mg | Freq: Two times a day (BID) | RESPIRATORY_TRACT | 2 refills | Status: DC

## 2020-01-04 MED ORDER — MIDODRINE HCL 5 MG PO TABS: 5.0000 mg | ORAL_TABLET | Freq: Three times a day (TID) | ORAL | 1 refills | Status: DC

## 2020-01-04 MED ORDER — METFORMIN HCL 500 MG PO TABS: 1000.0000 mg | ORAL_TABLET | Freq: Two times a day (BID) | ORAL | 1 refills | Status: DC

## 2020-01-04 NOTE — Progress Notes (Signed)
Physical Therapy Treatment Patient Details Name: Jamie Mitchell MRN: [id number] DOB: 1946-04-12 Today's Date: 01/04/2020    History of Present Illness Pt is 74 yo female with hx of afib, DM, HTN, hypothyroidism, and CVA (feb 2021).  She was admitted with hypoxic resp failure and severe sepsis due to multifocal PNA. 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 Pt was intubated 12/21/19 >>12/27/19, then had tracheostomy.    PT Comments    Pt and son felt that pt was progressing well toward safe mobility though not yet at baseline.  Emphasis on gait training with balance challenge.      Follow Up Recommendations  Home health PT     Equipment Recommendations  None recommended by PT    Recommendations for Other Services       Precautions / Restrictions Precautions Precautions:  (mild fall risk)    Mobility  Bed Mobility               General bed mobility comments: OOB on arrival  Transfers Overall transfer level: Needs assistance   Transfers: Sit to/from Stand Sit to Stand: Min guard            Ambulation/Gait Ambulation/Gait assistance: Min guard Gait Distance (Feet): 350 Feet Assistive device: None Gait Pattern/deviations: Step-through pattern;Decreased stride length;Drifts right/left Gait velocity: decreased Gait velocity interpretation: 1.31 - 2.62 ft/sec, indicative of limited community ambulator General Gait Details: guarded gait overall, some drift, occasional mild staggering that she can self-recover from..  Pt able to change speeds, scan, back up, abruptly change direction and step over low obstacles, but withou occasional deviation.   Stairs             Wheelchair Mobility    Modified Rankin (Stroke Patients Only)       Balance     Sitting balance-Leahy Scale: Good       Standing balance-Leahy Scale: Good                               Cognition Arousal/Alertness: Awake/alert Behavior During Therapy: WFL for tasks assessed/performed Overall Cognitive Status: Within Functional Limits for tasks assessed                                        Exercises      General Comments General comments (skin integrity, edema, etc.): sats maintain at mid to upper 90's on RA with PMV over trach during ambulation in the halls.      Pertinent Vitals/Pain Pain Assessment: No/denies pain    Home Living                      Prior Function            PT Goals (current goals can now be found in the care plan section) Acute Rehab PT Goals Patient Stated Goal: return home PT Goal Formulation: With patient/family Time For Goal Achievement: 01/16/20 Potential to Achieve Goals: Good Progress towards PT goals: Progressing toward goals    Frequency    Min 3X/week      PT Plan Current plan remains appropriate    Co-evaluation              AM-PAC PT "6 Clicks" Mobility   Outcome Measure  Help  needed turning from your back to your side while in a flat bed without using bedrails?: None Help needed moving from lying on your back to sitting on the side of a flat bed without using bedrails?: None Help needed moving to and from a bed to a chair (including a wheelchair)?: None Help needed standing up from a chair using your arms (e.g., wheelchair or bedside chair)?: A Little Help needed to walk in hospital room?: A Little Help needed climbing 3-5 steps with a railing? : A Little 6 Click Score: 21    End of Session   Activity Tolerance: Patient tolerated treatment well Patient left: in chair;with family/visitor present Nurse Communication: Mobility status PT Visit Diagnosis: Unsteadiness on feet (R26.81)     Time: 1116-1140 PT Time Calculation (min) (ACUTE ONLY): 24 min  Charges:  $Gait Training: 8-22 mins $Therapeutic Activity: 8-22 mins                    01/04/2020  Ginger Carne.,  PT Acute Rehabilitation Services [phone]  (pager) [phone]  (office)    Tessie Fass Kajol Crispen 01/04/2020, 12:37 PM

## 2020-01-04 NOTE — TOC Progression Note (Addendum)
Transition of Care [facility]) - Progression Note    Patient Details  Name: Jamie Mitchell MRN: [id number] Date of Birth: 08-Jan-1946  Transition of Care [facility]) CM/SW [place], RN Phone Number: [phone]  01/04/2020, 9:39 AM Son at bedside and offered choice for Home health. Cm called [facility] to set up home health. Alvis Lemmings reports that they are unable to take new trachs. CM was referred to [employer] of the Adult nursing office  Sula Rumple to determine if patient would be able to receive services from [facility]. Gaspar Bidding informed CM that their services are more for long term and ongoing private duty nursing to provide care in lieu of caregiver. Services through [facility] are not for short term teaching and reinforcement. Dr. Cyndia Skeeters has been made aware of this and that their is one call pending from [facility].  1215 CM spoke with Otis Dials at [facility] to inquire if patient would be appropriate to receive [facility] services after discharge with new trach.Per Sharyn Lull this is a safety issue for [facility] agencies to take new trach patients. [facility] is unable to accept new trach. Currently CM is unable to obtain Home Health due to patient having a new trach and no agency can accept new trach. MD [place] made aware.   70 Dr. Cyndia Skeeters is ok with patient not being able to be set up with Home Health services and wants to know if patient can receive outpatient Home health. CM spoke with son [name] at bedside who is agreeable to outpatient rehab and states that he or his wife would be able to transport patient to outpatient rehab. Outpatient rehab referral has been entered and info has been added to the avs. Information for trach clinic follow up has been forwarded to the respiratory care department secretary for scheduling follow up services.Lurline Idol order form has been faxed to [facility]. Bedside RN made aware that patient will need 3-4 day supply of trach supplies including trachs (1 of the same side and  1 the next size down). Ambulatory sats ranging 95-98% per RT note. Home O2 is not needed. Zach with [employer] has beed made aware that no O2 is needed but we need suction and humidification delivered. CM has placed 4day supply of trach supplies at the bedside and asked the secretary to order extra trach's to go in the bag. Suction canister for home has been delivered to the room and son is aware.   1510 CM confirmed with Zach at [facility] that all equipment has been delivered. Zach confirms suction delivered to the bedside and compressor for humidification will be delivered to the home.     Expected Discharge Plan: Home/Self Care Barriers to Discharge: Continued Medical Work up  Expected Discharge Plan and Services Expected Discharge Plan: Home/Self Care   Discharge Planning Services: CM Consult   Living arrangements for the past 2 months: Single Family Home Expected Discharge Date: 01/04/20                                     Social Determinants of Health (SDOH) Interventions    Readmission Risk Interventions No flowsheet data found.

## 2020-01-04 NOTE — Progress Notes (Signed)
Results for KIMI, BORDEAU (MRN [id number]) as of 01/04/2020 12:54  Ref. Range 01/03/2020 11:24 01/03/2020 16:37 01/03/2020 21:47 01/04/2020 08:06 01/04/2020 12:25  Glucose-Capillary Latest Ref Range: 70 - 99 mg/dL 319 (H) 204 (H) 144 (H) 130 (H) 307 (H)  Noted that postprandial CBGs are elevated.  Recommend adding Novolog 5 units TID as meal coverage given if  50% of meals are consumed and if blood sugars continue to be greater than 180 mg/dl.   Harvel Ricks RN BSN CDE Diabetes Coordinator Pager: [phone]  8am-5pm

## 2020-01-04 NOTE — Plan of Care (Signed)
  Problem: Clinical Measurements: Goal: Respiratory complications will improve Outcome: Progressing   Problem: Coping: Goal: Level of anxiety will decrease Outcome: Progressing   Problem: Nutrition: Goal: Adequate nutrition will be maintained Outcome: Progressing   Problem: Elimination: Goal: Will not experience complications related to bowel motility Outcome: Progressing   Problem: Elimination: Goal: Will not experience complications related to urinary retention Outcome: Progressing   Problem: Activity: Goal: Ability to tolerate increased activity will improve Outcome: Progressing

## 2020-01-04 NOTE — TOC Transition Note (Signed)
Transition of Care [facility]) - CM/SW Discharge Note   Patient Details  Name: Jamie Mitchell MRN: [id number] Date of Birth: Jan 14, 1946  Transition of Care [facility]) CM/SW Contact:  Angelita Ingles, RN Phone Number: [phone]  01/04/2020, 4:22 PM   Clinical Narrative:   Patient has been set up for outpt PT, follow up with trach clinic& supplies. Bedside nurse made aware to add trach same size and 1 size down to the supply bag. No further CM needs noted at this time. CM will sign off.     Final next level of care: Home/Self Care Barriers to Discharge: No Barriers Identified   Patient Goals and CMS Choice Patient states their goals for this hospitalization and ongoing recovery are:: Wants to go home CMS Medicare.gov Compare Post Acute Care list provided to:: Patient Represenative (must comment) (Son [place]) Choice offered to / list presented to : Adult Children  Discharge Placement                       Discharge Plan and Services   Discharge Planning Services: CM Consult            DME Arranged: Suction, Other see comment (compressor for humidity) DME Agency: [facility] Date DME Agency Contacted: 01/04/20 Time DME Agency Contacted: [id number] Representative spoke with at DME Agency: Anon Raices: NA [place] Agency: NA        Social Determinants of Health ([place]) Interventions     Readmission Risk Interventions No flowsheet data found.

## 2020-01-04 NOTE — Progress Notes (Signed)
  Speech Language Pathology Treatment: Jamie Mitchell Speaking valve  Patient Details Name: Jamie Mitchell MRN: [id number] DOB: [birth year] Today's Date: 01/04/2020 Time: 1100-1120 SLP Time Calculation (min) (ACUTE ONLY): 20 min  Assessment / Plan / Recommendation Clinical Impression  Pt's trach has been downsized to #4 cuffless, allowing much better access to upper airway and significant improvement in PMV toleration.  [name] present, as well as video interpreter. Pt tolerated PMV without difficulty.  She achieved excellent-quality phonation and speech was fully intelligible.  Pt/son both shown how to place and remove valve.  Pt was able to donn/doff valve with mirror and then without mirror independently.  She should use valve during all waking hours and remove prior to sleep.  She understands to remove valve if having difficulty breathing. AT end of session, PT walked with pt with her valve in place with stable VS throughout.  Education complete/goals met.  Pt is set up with ENT f/u as OP to manage trach.    HPI HPI: Pt is a 74 year old woman suffers from recurrent stridor and subglottic edema/stinosis presents with acute hypoxic respiratory failure resulting in intubation by EMS. Trach placed by ENT on 8/4.  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  During the hospitalization she underwent IR thrombectomy for which she was intubated for duration of 3 days.  After leaving the hospital her son reports she had dysphonia but no stridor. She was readmitted 0n 10/31/2019 with stridor, ENT exam showed subglottic swelling, confirmed by CT, discharged with a steriod taper. Was reporting delayed regurgitation of PO at that time, esophagram showed Gastroesophageal reflux andBorderline appearance for nonspecific esophageal dysmotility, given the presence of proximal escape and initial secondary and tertiary contractions. Readmitted on 6/25 with similar symptoms, MBS at that time  showed Mild oral dysphgia with delayed transit of boluses "No reflux or retrograde movement of boluses observed, however patient did exhibit s/s of some SOB/gasping after successive PO's, as well as instances of belching/gagging but without regurgitation." Primary complaint has been globus. Pt was tolerating mech soft diet at the time of d/c.       SLP Plan  All goals met       Recommendations         Patient may use Passy-Muir Speech Valve: During all waking hours (remove during sleep) PMSV Supervision: Intermittent         Oral Care Recommendations: Oral care BID Plan: All goals met       GO                Jamie Mitchell 01/04/2020, 11:27 AM  Jamie Mitchell L. Tivis Ringer, [name] Office number [phone] Pager [phone]

## 2020-01-04 NOTE — Progress Notes (Signed)
SATURATION QUALIFICATIONS: (This note is used to comply with regulatory documentation for home oxygen)  Patient Saturations on Room Air at Rest = 98%  Patient Saturations on Room Air while Ambulating = 95-98%  Please briefly explain why patient needs home oxygen:  Pt is saturating well on RA with a PMV on her trach during ambulation in the hall.  Mild to moderate dyspnea at times due to deconditioning, but sats stayed generally normal overall.  01/04/2020  Ginger Carne., PT Acute Rehabilitation Services [phone]  (pager) [phone]  (office)

## 2020-01-04 NOTE — Discharge Summary (Signed)
Physician Discharge Summary  Jamie Mitchell VHQ:[id number] DOB: [birth year] DOA: 12/21/2019  PCP: Charlott Rakes, MD  Admit date: 12/21/2019 Discharge date: 01/04/2020  Admitted From: Home Disposition: Home  Recommendations for Outpatient Follow-up:  1. Follow ups as below. 2. Left voicemail for [place] [facility] to schedule apt with Pete 3. Please obtain CBC/BMP/Mag at follow up 4. Please follow up on the following pending results: None  Home Health: Case management not able to find [facility] agency who can take patient. Outpatient PT ordered Equipment/Devices: Trach care supplies  Discharge Condition: Stable CODE STATUS: Full code   Follow-up Information    Charlott Rakes, MD. Schedule an appointment as soon as possible for a visit in 1 week(s).   Specialty: Family Medicine Contact information: [place] [address] [phone]        Izora Gala, MD. Schedule an appointment as soon as possible for a visit in 2 week(s).   Specialty: Otolaryngology Contact information: [address] [place] [address] [phone]        [facility] Follow up.   Why: You have been set up with outpatient rehab. The rehab center will call you to set up start of service date. Should you have any questions please call number listed above. Contact information: [place] [address] [phone]               Hospital Course: 74 y.o. Spanish-speaking female with PMH of CVA, Afib, DM, HTN and hypothyroidism admitted 12/21/19 for acute hypoxic respiratory failure secondary to multifocal pneumonia. 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  She underwent tracheostomy on 8/4 by ENT.   Plan is to downsize to a #4 cuffless per ENT recommendation and discharge home.  Patient and  family has  received trach care.   Per respiratory therapy, patient's son demonstrated require competency to take care of patient's trach.  Updated Dr. Constance Holster from ENT about patient's discharge plan.  He recommended follow-up with him in 2 weeks. PCCM also recommended follow up in their clinic in about a month. I have left voice mail at trach clinic to schedule her with Laurey Arrow in one month.   Case management was not able to secure home health for patient.  Referral to outpatient PT ordered.  See individual problem list below for more on hospital course.  Discharge Diagnoses:  Acute hypoxic respiratory failure with hypoxia and hypercarbia  Severe sepsis due to multifocal pneumonia Subglottic stenosis/recurrent glottic edema -Completed antibiotic course. -Tracheostomy on 8/4. To cuffless, #4 on 01/03/2020 -Family received trach care teaching -Taper steroid as below -Outpatient follow-up with ENT and [facility] tr[facility] clinic as above  Orthostatic hypotension: B normotensive. -Continue low-dose midodrine -Discontinued Florinef.  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  On amiodarone and Eliquis at home -Continue amiodarone and Eliquis   Uncontrolled DM-2 with hyperglycemia: A1c 7.8%.  Hyperglycemia could be due to steroid.  On Metformin at home. -Increased home Metformin to 1000 mg twice daily -Tapering steroid -Continue statin  Hypothyroidism:  -Continue levothyroxine  Language barrier affecting communication -Video interpreter utilized for this encounter    Body mass index is 28.18 kg/m. Nutrition Problem: Inadequate oral intake Etiology: inability to eat Signs/Symptoms: NPO  status Interventions: Ensure Enlive (each supplement provides 350kcal and 20 grams of protein), Magic cup       Discharge Exam: Vitals:   01/04/20 1459 01/04/20 1605  BP:  114/73  Pulse: (!) 56 64  Resp: 18 15  Temp:  98.2 F (36.8 C)  SpO2: 92% 96%    GENERAL: No apparent distress.  Nontoxic. HEENT: MMM.  Vision and hearing grossly intact.  NECK: Trach collar RESP:  No IWOB.  Fair aeration bilaterally. CVS:  RRR. Heart sounds normal.  ABD/GI/GU: Bowel sounds present. Soft. Non tender.  MSK/EXT:  Moves extremities. No apparent deformity. No edema.  SKIN: no apparent skin lesion or wound NEURO: Awake, alert and oriented appropriately.  No apparent focal neuro deficit. PSYCH: Calm. Normal affect.   Discharge Instructions  Discharge Instructions    Ambulatory referral to Physical Therapy   Complete by: As directed    Iontophoresis - 4 mg/ml of dexamethasone: No   T.E.N.S. Unit Evaluation and Dispense as Indicated: No   Diet - low sodium heart healthy   Complete by: As directed    Diet Carb Modified   Complete by: As directed    Discharge instructions   Complete by: As directed    It has been a pleasure taking care of you!  You were hospitalized and treated for pneumonia.  You were treated medically and surgically.  Please go to your follow-up appointment with ENT and other providers as scheduled.  Also recommend follow-up with your primary care doctor in 1 to 2 weeks.   We may have started you on other new medications or made some changes to your home medications during this hospitalization. Please review your new medication list and the directions carefully before you take them.    Take care,   Increase activity slowly   Complete by: As directed    No wound care   Complete by: As directed      Allergies as of 01/04/2020   No Known Allergies     Medication List    STOP taking these medications   albuterol 108 (90 Base) MCG/ACT inhaler Commonly known as: VENTOLIN HFA   enoxaparin 60 MG/0.6ML injection Commonly known as: LOVENOX     TAKE these medications     amiodarone 200 MG tablet Commonly known as: PACERONE Take 1 tablet (200 mg total) by mouth daily.   atorvastatin 40 MG tablet Commonly known as: LIPITOR Take 1 tablet (40 mg total) by mouth daily. What changed:   how to take this  when to take this   cetirizine 10 MG tablet Commonly known as: ZyrTEC Allergy Take 1 tablet (10 mg total) by mouth 2 (two) times daily.   diclofenac Sodium 1 % Gel Commonly known as: Voltaren Apply 2 g topically 4 (four) times daily. What changed:   when to take this  reasons to take this   EPINEPHrine 0.3 mg/0.3 mL Soaj injection Commonly known as: EPI-PEN Inject 0.3 mLs (0.3 mg total) into the muscle as needed for anaphylaxis.   famotidine 20 MG tablet Commonly known as: PEPCID Take 1 tablet (20 mg total) by mouth 2 (two) times daily.   ipratropium-albuterol 0.5-2.5 (3) MG/3ML Soln Commonly known as: DUONEB Take 3 mLs by nebulization every 6 (six) hours as needed.   levothyroxine 88 MCG tablet Commonly known as: SYNTHROID Take 1 tablet (88 mcg total) by mouth daily before breakfast.   metFORMIN 500 MG tablet Commonly known as: GLUCOPHAGE Take 2 tablets (1,000 mg total) by  mouth 2 (two) times daily with a meal. What changed: how much to take   midodrine 5 MG tablet Commonly known as: PROAMATINE Take 1 tablet (5 mg total) by mouth 3 (three) times daily with meals.   omeprazole 40 MG capsule Commonly known as: PRILOSEC Take 40 mg by mouth 2 (two) times daily.   predniSONE 10 MG tablet Commonly known as: DELTASONE Take 2 tablets daily for 3 days, 1 tablet daily for 3 days, then half a tablet for 3 days            Durable Medical Equipment  (From admission, onward)         Start     Ordered   01/01/20 1515  For home use only DME Trach supplies  Once       Question Answer Comment  Trach Type Shiley   Size 6   Cuffed or Uncuffed Uncuffed   Fenestrated No   Does patient need speaking valve? Yes   Trach Humidification  No   Suction Needed? Yes   Manual Resuscitator Needed? No   Emergency Backup Trach size (one size smaller than trach in throat) 4      01/01/20 1516          Consultations:  PCCM  ENT  Procedures/Studies:  8/4-tracheostomy by ENT   CT Head Wo Contrast  Result Date: 12/21/2019 CLINICAL DATA:  Mental status change, unknown cause. EXAM: CT HEAD WITHOUT CONTRAST TECHNIQUE: Contiguous axial images were obtained from the base of the skull through the vertex without intravenous contrast. COMPARISON:  Head CT 07/31/2019, brain MRI 07/22/2019 FINDINGS: Brain: Redemonstrated large chronic left MCA territory infarct centered within the left frontal operculum, left insula and anterolateral left frontal lobe. The extent of this infarct matches the extent of restricted diffusion demonstrated on prior brain MRI 07/22/2019. No acute intracranial hemorrhage or acute infarct is identified. No extra-axial fluid collection. No evidence of intracranial mass. No midline shift. Redemonstrated scattered parenchymal calcifications within the brain. Vascular: No hyperdense vessel. Skull: Normal. Negative for fracture or focal lesion. Sinuses/Orbits: Visualized orbits show no acute finding. Mild ethmoid and left maxillary sinus mucosal thickening. Left middle ear/mastoid effusion. Other: Partially visualized support tubes. IMPRESSION: No CT evidence of acute intracranial abnormality. Redemonstrated large chronic left frontal lobe infarct. 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 Mild ethmoid and left maxillary sinus mucosal thickening. Large left middle ear/mastoid effusion. Electronically Signed   By: Kellie Simmering DO   On: 12/21/2019 18:38   DG Chest Port 1 View  Result Date: 12/23/2019 CLINICAL DATA:  Respiratory failure. EXAM: PORTABLE CHEST 1 VIEW COMPARISON:  12/22/2019 FINDINGS: 0553 hours. Endotracheal tube tip is 3.4 cm above the base  of the carina. NG tube tip is in the stomach. Interstitial markings are diffusely coarsened with chronic features. Streaky opacity at the bases likely atelectasis or scarring. The visualized bony structures of the thorax show now acute abnormality. Telemetry leads overlie the chest. IMPRESSION: Streaky basilar opacity bilaterally likely atelectasis or scarring. Alveolar opacity described on the previous study has resolved in the interval. Electronically Signed   By: Misty Stanley M.D.   On: 12/23/2019 10:50   DG Chest Port 1 View  Result Date: 12/22/2019 CLINICAL DATA:  Intubation. EXAM: PORTABLE CHEST 1 VIEW COMPARISON:  12/21/2019.  12/02/2019. FINDINGS: Endotracheal tube tip 1 cm above the carina. NG tube tip noted in the stomach. Heart size normal. Bilateral pulmonary interstitial prominence again noted. Bilateral pulmonary  infiltrates again noted. Interim improvement from prior exam. Low lung volumes with bibasilar atelectasis. IMPRESSION: 1. Endotracheal tube tip 1 cm above the carina. NG tube tip noted the stomach. 2. Bilateral pulmonary interstitial prominence again noted. Bilateral pulmonary alveolar infiltrates again noted. Interim improvement from prior exam. Low lung volumes with bibasilar atelectasis. Electronically Signed   By: Marcello Moores  Register   On: 12/22/2019 05:23   DG Chest Port 1 View  Result Date: 12/21/2019 CLINICAL DATA:  74 year old female with respiratory failure. EXAM: PORTABLE CHEST 1 VIEW COMPARISON:  Chest radiograph dated 12/02/2019 and CT dated 12/02/2019 FINDINGS: Endotracheal tube with tip approximately 15 mm above the carina. Recommend retraction by approximately 3 cm for optimal positioning. Diffuse bilateral interstitial and hazy airspace density may represent multifocal pneumonia, edema, or ARDS. Clinical correlation is recommended. No pleural effusion or pneumothorax. The cardiac silhouette is within limits. No acute osseous pathology. Air distended stomach. IMPRESSION:  1. Endotracheal tube above the carina. Recommend retraction by approximately 3 cm for optimal positioning. 2. Diffuse interstitial and airspace densities. Electronically Signed   By: Anner Crete M.D.   On: 12/21/2019 17:36   DG Abd Portable 1V  Result Date: 12/21/2019 CLINICAL DATA:  NG tube placement EXAM: PORTABLE ABDOMEN - 1 VIEW COMPARISON:  None. FINDINGS: Esophageal tube tip and side-port project over the proximal stomach. Moderate diffuse gaseous enlargement large and small bowel, small bowel dilated up to 5.3 cm. Gas is present at the rectum. No radiopaque calculi are seen. IMPRESSION: 1. Esophageal tube tip and side-port project over the proximal stomach. 2. Moderate diffuse gaseous enlargement of the bowel suggestive of ileus. Electronically Signed   By: Donavan Foil M.D.   On: 12/21/2019 23:37        The results of significant diagnostics from this hospitalization (including imaging, microbiology, ancillary and laboratory) are listed below for reference.     Microbiology: Recent Results (from the past 240 hour(s))  Culture, respiratory     Status: None   Collection Time: 12/31/19  5:00 PM   Specimen: Tracheal Aspirate  Result Value Ref Range Status   Specimen Description TRACHEAL ASPIRATE  Final   Special Requests NONE  Final   Gram Stain   Final    ABUNDANT WBC PRESENT, PREDOMINANTLY PMN NO ORGANISMS SEEN    Culture   Final    RARE Normal respiratory flora-no Staph aureus or Pseudomonas seen Performed at [facility], [address], [place], [place] [address]    Report Status 01/03/2020 FINAL  Final     Labs: BNP (last 3 results) Recent Labs    12/02/19 1021 12/21/19 1709  BNP 92.1 998.3*   Basic Metabolic Panel: Recent Labs  Lab 12/29/19 0330 12/30/19 0142 12/31/19 0148 01/01/20 0153 01/04/20 0108  NA 136 135 136 135 135  K 3.9 4.1 4.2 4.3 3.9  CL 101 103 104 106 102  CO2 24 20* 24 19* 25  GLUCOSE 128* 160* 116* 189* 98  BUN 15 25* 12 20  28*  CREATININE 0.74 0.82 0.78 0.95 1.00  CALCIUM 8.4* 7.9* 8.4* 8.3* 8.5*  MG  --   --   --   --  2.4  PHOS  --   --   --   --  3.6   Liver Function Tests: Recent Labs  Lab 01/04/20 0108  ALBUMIN 2.7*   No results for input(s): LIPASE, AMYLASE in the last 168 hours. No results for input(s): AMMONIA in the last 168 hours. CBC: Recent  Labs  Lab 12/29/19 0330 12/30/19 0142 12/31/19 0148 01/04/20 0108  WBC 11.9* 11.1* 8.5 13.9*  HGB 12.3 11.6* 12.7 11.0*  HCT 37.6 35.6* 39.1 33.8*  MCV 87.9 88.3 87.5 89.2  PLT 257 269 283 326   Cardiac Enzymes: No results for input(s): CKTOTAL, CKMB, CKMBINDEX, TROPONINI in the last 168 hours. BNP: Invalid input(s): POCBNP CBG: Recent Labs  Lab 01/03/20 1637 01/03/20 2147 01/04/20 0806 01/04/20 1225 01/04/20 1603  GLUCAP 204* 144* 130* 307* 277*   D-Dimer No results for input(s): DDIMER in the last 72 hours. Hgb A1c Recent Labs    01/03/20 0950  HGBA1C 7.8*   Lipid Profile No results for input(s): CHOL, HDL, LDLCALC, TRIG, CHOLHDL, LDLDIRECT in the last 72 hours. Thyroid function studies No results for input(s): TSH, T4TOTAL, T3FREE, THYROIDAB in the last 72 hours.  Invalid input(s): FREET3 Anemia work up No results for input(s): VITAMINB12, FOLATE, FERRITIN, TIBC, IRON, RETICCTPCT in the last 72 hours. Urinalysis    Component Value Date/Time   COLORURINE YELLOW 12/21/2019 1800   APPEARANCEUR CLEAR 12/21/2019 1800   LABSPEC 1.010 12/21/2019 1800   PHURINE 7.0 12/21/2019 1800   GLUCOSEU >=500 (A) 12/21/2019 1800   HGBUR NEGATIVE 12/21/2019 1800   BILIRUBINUR NEGATIVE 12/21/2019 1800   KETONESUR NEGATIVE 12/21/2019 1800   PROTEINUR 30 (A) 12/21/2019 1800   NITRITE NEGATIVE 12/21/2019 1800   LEUKOCYTESUR NEGATIVE 12/21/2019 1800   Sepsis Labs Invalid input(s): PROCALCITONIN,  WBC,  LACTICIDVEN   Time coordinating discharge: 35 minutes  SIGNED:  Mercy Riding, MD  [facility] 01/04/2020, 7:31 PM  If  7PM-7AM, please contact night-coverage [email or web address] Password [email or web address]

## 2020-01-04 NOTE — Discharge Instructions (Signed)
Diabetes mellitus y actividad fsica Diabetes Mellitus and Exercise Hacer actividad fsica habitualmente es importante para el estado de salud general, en especial si tiene diabetes (diabetes mellitus). La actividad fsica no solo se reduce a bajar de peso. Aporta muchos beneficios para la salud, como aumento de la fuerza muscular y la densidad sea, y reduccin de las grasas corporales y el estrs. Esto mejora el estado fsico, la flexibilidad y la resistencia, y todo ello redunda en un mejor estado de salud general. La actividad fsica tiene beneficios adicionales para los diabticos, entre ellos:  Disminuye el apetito.  Ayuda a bajar y mantener la glucemia bajo control.  Baja la presin arterial.  Ayuda a controlar las cantidades de sustancias grasas (lpidos) en la sangre, como el colesterol y los triglicridos.  Mejora la respuesta del cuerpo a la insulina (optimizacin de la sensibilidad a la insulina).  Reduce la cantidad de insulina que el cuerpo necesita.  Reduce el riesgo de sufrir cardiopata coronaria de la siguiente forma: ? Baja los niveles de colesterol y triglicridos. ? Aumenta los niveles de colesterol bueno. ? Disminuye la glucemia. Cul es mi plan de actividad? El mdico o un educador para la diabetes certificado pueden ayudarlo a elaborar un plan respecto del tipo y de la frecuencia de actividad fsica (plan de actividades) adecuado para usted. Asegrese de lo siguiente:  Haga por lo menos 150minutos semanales de ejercicios de intensidad moderada o vigorosa. Estos podran ser caminatas dinmicas, ciclismo o gimnasia acutica. ? Haga ejercicios de elongacin y de fortalecimiento, como yoga o levantamiento de pesas, por lo menos 2veces por semana. ? Reparta la actividad en al menos 3das de la semana.  Haga algn tipo de actividad fsica todos los das. ? No deje pasar ms de 2das seguidos sin hacer algn tipo de actividad fsica. ? Evite permanecer inactivo  durante ms de 30minutos seguidos. Tmese descansos frecuentes para caminar o estirarse.  Elija un tipo de ejercicio o de actividad que disfrute y establezca objetivos realistas.  Comience lentamente y aumente de manera gradual la intensidad del ejercicio con el correr del tiempo. Qu debo saber acerca del control de la diabetes?   Contrlese la glucemia antes y despus de ejercitarse. ? Si la glucemia es de 240mg/dl (13,3mmol/l) o ms antes de comenzar a hacer actividad fsica, controle la orina para detectar la presencia de cetonas. Si tiene cetonas en la orina, no haga ejercicio hasta que la glucemia se normalice. ? Si la glucemia es de 100 mg/dl (5.6 mmol/l) o menos, tome una colacin que contenga entre 15 y 20 gramos de carbohidratos. Controle la glucemia 15 minutos despus de la colacin para asegurarse de que el nivel est por encima de 100 mg/dl (5.6 mmol/l) antes de comenzar a hacer actividad fsica.  Conozca los sntomas de la glucemia baja (hipoglucemia) y aprenda cmo tratarla. El riesgo de tener hipoglucemia aumenta durante y despus de hacer actividad fsica. Los sntomas frecuentes de hipoglucemia pueden incluir los siguientes: ? Hambre. ? Ansiedad. ? Sudoracin y piel hmeda. ? Confusin. ? Mareos o sensacin de desvanecimiento. ? Aumento de la frecuencia cardaca o palpitaciones. ? Visin borrosa. ? Hormigueo o adormecimiento alrededor de la boca, los labios o la lengua. ? Estremecimientos y temblores. ? Irritabilidad.  Tenga una colacin de carbohidratos de accin rpida disponible antes, durante y despus de ejercitarse, a fin de evitar o tratar la hipoglucemia.  Evite inyectarse insulina en las zonas del cuerpo que ejercitar. Por ejemplo, evite inyectarse insulina en: ? Los brazos,   si juega al tenis. ? Las piernas, si corre.  Lleve registros de sus hbitos de actividad fsica. Esto puede ayudarlos a usted y al mdico a Tax adviser de control de la diabetes  segn sea necesario. Escriba los siguientes datos: ? Los alimentos que consume antes y despus de Field seismologist actividad fsica. ? Los niveles de glucosa en la sangre antes y despus de hacer [place]. ? El tipo y cantidad de [place] fsica que Musician. ? Cuando se prev que la insulina alcance su valor mximo, si [place] insulina. No haga actividad fsica en los momentos en que insulina alcanza su valor mximo.  Cuando comience un ejercicio o una actividad nuevos, trabaje con el mdico para asegurarse de que la actividad sea segura para usted y para Secretary/administrator la [place], los medicamentos o la ingesta de alimentos segn sea necesario.  Beba gran cantidad de agua mientras hace ejercicio para evitar la deshidratacin o los golpes de Freight forwarder. Beba suficiente lquido como para mantener la orina clara o de color amarillo plido. Resumen  Hacer actividad fsica habitualmente es importante para el estado de salud general, en especial si tiene diabetes (diabetes mellitus).  La actividad fsica aporta muchos beneficios para la salud, como aumentar la fuerza muscular y la densidad sea, y reducir las grasas corporales y Dealer.  El mdico o un educador para la diabetes certificado pueden ayudarlo a Engineer, petroleum del tipo y de la frecuencia de actividad fsica (plan de actividades) adecuado para usted.  Cuando comience un ejercicio o una actividad nuevos, trabaje con el mdico para asegurarse de que la actividad sea segura para usted y para Secretary/administrator la [place], los medicamentos o la ingesta de alimentos segn sea necesario. Esta informacin no tiene Marine scientist el consejo del mdico. Asegrese de hacerle al mdico cualquier pregunta que tenga. Document Revised: 03/08/2017 Document Reviewed: 10/21/2015 Elsevier Patient Education  Routt diabetes mellitus y el cuidado de los pies Diabetes Mellitus and Foot Care El cuidado de los pies es un aspecto importante de la salud,  especialmente si tiene diabetes. La diabetes puede generar problemas debido a que el flujo sanguneo (circulacin) es deficiente en las piernas y los pies, y esto puede hacer que la piel:  Se torne ms fina y Audiological scientist.  Se resquebraje ms fcilmente.  Cicatrice ms lentamente.  Se descame y agriete. Tambin pueden estar daados los nervios (neuropata) de las piernas y de los pies, lo que provoca una disminucin de la sensibilidad. En consecuencia, es posible que no advierta heridas pequeas en los pies que pueden causar problemas ms graves. Identificar y tratar cualquier complicacin lo antes posible es la mejor manera de evitar futuros problemas de pie. Cmo cuidar los pies Higiene de los pies  [employer] pies todos los das con agua tibia y un [place]. No use agua caliente. Luego squese los pies y [employer] dedos dando palmaditas, hasta que estn completamente secos. No remoje los pies, ya que esto puede resecar la piel.  Crtese las uas de los pies en lnea recta. No escarbe debajo de las uas o alrededor [employer]. Lime los bordes de las uas con una lima o esmeril.  Aplique una locin hidratante o vaselina en la piel de los pies y en las uas secas y [place]. Use una locin que no contenga alcohol ni fragancias. No aplique locin entre los dedos. Zapatos y calcetines  Use calcetines de algodn o medias limpias todos los [place].  Asegrese de que no le [employer]. No use calcetines que le lleguen a las rodillas, ya que podran disminuir el flujo de sangre a las piernas.  Use zapatos de cuero que le queden bien y que sean acolchados. Revise siempre los zapatos antes de ponerlos para asegurarse de que no haya objetos en su interior.  Para amoldar los zapatos, clcelos solo algunas horas por da. Esto evitar lesiones en los pies. Heridas, rasguos, durezas y callosidades  Controle sus pies diariamente para observar si hay ampollas, cortes, moretones, llagas o  enrojecimiento. Si no puede ver la planta del pie, use un espejo o pdale ayuda a Nurse, children's.  No corte las durezas o callosidades, ni trate de quitarlas con medicamentos.  Si algo le ha raspado, cortado o lastimado la piel de los pies, mantenga la piel de esa zona limpia y [place]. Puede higienizar estas zonas con agua y un jabn suave. No limpie la zona con agua oxigenada, alcohol ni yodo.  Si tiene una herida, un rasguo, una dureza o una callosidad en el pie, revsela varias veces al da para asegurarse de que se est curando y no se infecte. Est atento a los siguientes signos: ? Dolor, hinchazn o enrojecimiento. ? Lquido o sangre. ? Calor. ? Pus o mal olor. Instrucciones generales  No se cruce de piernas. Esto puede disminuir el flujo de sangre a los pies.  No use bolsas de agua caliente ni almohadillas trmicas en los pies. Podran causar quemaduras. Si ha perdido la sensibilidad en los pies o las piernas, no sabr lo que le est sucediendo hasta que sea demasiado tarde.  Proteja sus pies del calor y del fro con calzado, en la playa o sobre el pavimento caliente.  Programe una cita para un examen completo de los pies por lo menos una vez al ao (anualmente) o con ms frecuencia si tiene [employer]. Si tiene [employer], infrmele al mdico de [employer] cortes, las llagas o los moretones. Comunquese con un mdico si:  Tiene una afeccin que aumenta su riesgo de tener infecciones y tiene cortes, llagas o moretones en los pies.  Tiene una lesin que no se [place].  Tiene una zona irritada en las piernas o los pies.  Siente una sensacin de ardor u hormigueo en las piernas o los pies.  Siente dolor o calambres en las piernas o los pies.  Las piernas o los pies estn adormecidos.  Siente los pies siempre fros.  Siente dolor alrededor de una ua del pie. Solicite ayuda de inmediato si:  Tiene una herida, un rasguo, una dureza o una callosidad  en el pie y: ? Scientist, clinical (histocompatibility and immunogenetics), hinchazn o enrojecimiento que empeora. ? Le sale lquido o sangre de la herida, el rasguo, la dureza o la callosidad. ? La herida, el rasguo, la dureza o la callosidad est caliente al tacto. ? Le sale pus o mal olor de la herida, el rasguo, la dureza o la callosidad. ? Tiene fiebre. ? Tiene una lnea roja que sube por la pierna. Resumen  Controle todos los das el estado de sus pies para observar si hay cortes, llagas, manchas rojas, hinchazn o ampollas.  Humctese los pies y las piernas a diario.  Use zapatos de cuero que le queden bien y que sean acolchados.  Si tiene [employer], infrmele al mdico de [employer] cortes, las llagas o los moretones.  Programe una cita para un examen completo de los  pies por lo menos una vez al ao (anualmente) o con ms frecuencia si tiene [employer]. Esta informacin no tiene Marine scientist el consejo del mdico. Asegrese de hacerle al mdico cualquier pregunta que tenga. Document Revised: 01/01/2017 Document Reviewed: 01/01/2017 Elsevier Patient Education  [place].   Informacin bsica sobre la diabetes Diabetes Basics  La diabetes (diabetes mellitus) es una enfermedad de larga duracin (crnica). Se produce cuando el cuerpo no utiliza Occupational hygienist (glucosa) que se libera de los alimentos despus de comer. La diabetes puede deberse a uno de [employer] o a ambos:  El pncreas no produce suficiente cantidad de una hormona llamada insulina.  El cuerpo no reacciona de forma normal a la insulina que produce. La insulina permite que ciertos azcares (glucosa) ingresen a las clulas del cuerpo. Esto le proporciona energa. Si tiene diabetes, los azcares no pueden ingresar a las clulas. Esto produce un aumento del nivel de Dispensing optician (hiperglucemia). Sigue estas instrucciones en tu casa: Cmo se trata la diabetes? Es posible que tenga que  administrarse insulina u otros medicamentos para la diabetes todos los [place] para mantener el nivel de Location manager en la sangre equilibrado. Adminstrese los medicamentos para la diabetes todos los [place] se lo haya indicado el mdico. Haga una lista de los medicamentos para la diabetes aqu: Medicamentos para la diabetes  Nombre del medicamento: ______________________________ ? Cantidad (dosis): ________________ Mellody Drown (a.m./p.m.): _______________ Wilford Grist: ___________________________________  Micki Riley medicamento: ______________________________ ? Cantidad (dosis): ________________ Mellody Drown (a.m./p.m.): _______________ Wilford Grist: ___________________________________  Micki Riley medicamento: ______________________________ ? Cantidad (dosis): ________________ Mellody Drown (a.m./p.m.): _______________ Wilford Grist: ___________________________________ Si [place] insulina, aprender cmo aplicrsela con inyecciones. Es posible que deba ajustar la cantidad en funcin de los alimentos que coma. Haga una lista de los tipos de [place] aqu: Insulina  Tipo de insulina: ______________________________ ? Cantidad (dosis): ________________ Mellody Drown (a.m./p.m.): _______________ Wilford Grist: ___________________________________  Suezanne Jacquet: ______________________________ ? Cantidad (dosis): ________________ Mellody Drown (a.m./p.m.): _______________ Wilford Grist: ___________________________________  Suezanne Jacquet: ______________________________ ? Cantidad (dosis): ________________ Mellody Drown (a.m./p.m.): _______________ Wilford Grist: ___________________________________  Suezanne Jacquet: ______________________________ ? Cantidad (dosis): ________________ Mellody Drown (a.m./p.m.): _______________ Wilford Grist: ___________________________________  Suezanne Jacquet: ______________________________ ? Cantidad (dosis): ________________ Mellody Drown (a.m./p.m.): _______________ Wilford Grist: ___________________________________ Cmo me controlo el nivel de azcar en la  sangre?  Controle sus niveles de azcar en la sangre con un medidor de glucemia segn las indicaciones del mdico. El mdico fijar los objetivos del tratamiento para usted. Generalmente, los resultados de los niveles de azcar en la sangre deben ser los siguientes:  Antes de las comidas (preprandial): de 80 a 130mg /dl (de 4,4 a 7,57mmol/l).  Despus de las comidas (posprandial): por debajo de 180mg /dl (35mmol/l).  Nivel de A1c: menos del 7%. Anote las veces que se controlar los niveles de azcar en la sangre: Controles de azcar en la sangre  Hora: _______________ Wilford Grist: ___________________________________  Mellody Drown: _______________ Notas: ___________________________________  Hora: _______________ Notas: ___________________________________  Hora: _______________ Notas: ___________________________________  Hora: _______________ Notas: ___________________________________  Hora: _______________ Notas: ___________________________________  Sander Nephew debo saber acerca del nivel bajo de azcar en la sangre? Un nivel bajo de azcar en la sangre se denomina hipoglucemia. Este cuadro ocurre cuando el nivel de azcar en la sangre es igual o menor que 70mg /dl (3,57mmol/l). Entre los sntomas, se pueden incluir los siguientes:  Sentir: ? [place]. ? Preocupacin o nervios (ansiedad). ? Sudoracin y [employer]. ? Confusin. ? Mareos. ? Somnolencia. ? Ganas de vomitar (nuseas).  Tener: ? Latidos cardacos acelerados. ?  Dolor de [place]. ? Cambios en la visin. ? Hormigueo y falta de sensibilidad (entumecimiento) alrededor de la boca, los labios o la [place]. ? Movimientos espasmdicos que no puede controlar (convulsiones).  Dificultades para hacer lo siguiente: ? Moverse (coordinacin). ? [place]. ? Desmayos. ? Molestarse con facilidad (irritabilidad). Tratamiento del nivel bajo de azcar en la sangre Para tratar un nivel bajo de azcar en la sangre, ingiera un alimento o una bebida  azucarada de inmediato. Si puede pensar con claridad y tragar de manera segura, siga la regla 15/15, que consiste en lo siguiente:  Consuma 15gramos de un hidrato de carbono de accin rpida (carbohidrato). Hable con su mdico acerca de cunto debera consumir.  Algunos hidratos de carbono de accin rpida son: ? Comprimidos de azcar (pastillas de glucosa). Consuma 3o 4pastillas de glucosa. ? De 6 a 8unidades de caramelos duros. ? De 4 a 6onzas (de 120 a 158ml) de jugo de frutas. ? De 4 a 6onzas (de 120 a 1108ml) de refresco comn (no diettico). ? 1 cucharada (9ml) de miel o azcar.  Contrlese el nivel de azcar en la sangre 66minutos despus de ingerir el hidrato de carbono.  Si el nivel de azcar en la sangre todava es igual o menor que 70mg /dl (3,61mmol/l), ingiera nuevamente 15gramos de un hidrato de carbono.  Si el nivel de azcar en la sangre no supera los 70mg /dl (3,85mmol/l) despus de 3intentos, solicite ayuda de inmediato.  Ingiera una comida o una colacin en el transcurso de 1hora despus de que el nivel de azcar en la sangre se haya normalizado. Tratamiento del nivel muy bajo de azcar en la sangre Si el nivel de azcar en la sangre es igual o menor que 54mg /dl (79mmol/l), significa que est muy bajo (hipoglucemia grave). Esto es Engineer, maintenance (IT). No espere a ver si los sntomas desaparecen. Solicite atencin mdica de inmediato. Comunquese con el servicio de emergencias de su localidad (911 en los [place]). No conduzca por sus propios medios [employer] hospital. Preguntas para hacerle al mdico  Es necesario que me rena con Radio broadcast assistant en el cuidado de la diabetes?  Qu equipos necesitar para cuidarme en casa?  Qu medicamentos para la diabetes necesito? Cundo debo tomarlos?  Con qu frecuencia debo controlar mi nivel de azcar en la sangre?  A qu nmero puedo llamar si tengo preguntas?  Cundo es mi prxima cita con el  mdico?  Dnde puedo encontrar un grupo de apoyo para las personas con diabetes? Dnde buscar ms informacin  American Diabetes Association (Asociacin Estadounidense de la Diabetes): www.diabetes.org  American Association of Diabetes Educators (Asociacin Estadounidense de Instructores para el Cuidado de la Diabetes): www.diabeteseducator.org/patient-resources Comunquese con un mdico si:  El nivel de azcar en la sangre es igual o mayor que 240mg /dl (13,24mmol/dl) durante 2das seguidos.  Ha estado enfermo o ha tenido fiebre durante 2das o ms y no mejora.  Tiene alguno de estos problemas durante ms de 6horas: ? No puede comer ni beber. ? Siente malestar estomacal (nuseas). ? [place]. ? Presenta heces lquidas (diarrea). Solicite ayuda inmediatamente si:  El nivel de azcar en la sangre est por debajo de 54mg /dl (32mmol/l).  Se siente confundida.  Tiene dificultad para hacer lo siguiente: ? Pensar con claridad. ? La respiracin. Resumen  La diabetes (diabetes mellitus) es una enfermedad de larga duracin (crnica). Se produce cuando el cuerpo no utiliza Occupational hygienist (glucosa) que se libera de los alimentos despus de la digestin.  Aplquese la insulina y [employer]  medicamentos para la diabetes como se lo hayan indicado.  Contrlese el nivel de azcar en la sangre todos los [place], con la frecuencia que le hayan indicado.  Concurra a todas las visitas de seguimiento como se lo haya indicado el mdico. Esto es importante. Esta informacin no tiene Marine scientist el consejo del mdico. Asegrese de hacerle al mdico cualquier pregunta que tenga. Document Revised: 07/06/2018 Document Reviewed: 09/17/2017 Elsevier Patient Education  [place].

## 2020-01-05 ENCOUNTER — Other Ambulatory Visit: Payer: Self-pay | Admitting: Student

## 2020-01-05 MED ORDER — IPRATROPIUM-ALBUTEROL 0.5-2.5 (3) MG/3ML IN SOLN: 3.0000 mL | Freq: Four times a day (QID) | RESPIRATORY_TRACT | 2 refills | Status: DC | PRN

## 2020-01-05 NOTE — Progress Notes (Signed)
Sent Rx for Duoneb to [facility] on [address] as requested.

## 2020-01-08 ENCOUNTER — Other Ambulatory Visit: Payer: Self-pay | Admitting: *Deleted

## 2020-01-08 NOTE — Patient Outreach (Signed)
[facility]) Care Management  [place]  01/08/2020   Jamie Mitchell 07-08-1945 [id number]  Subjective: Telephone calltopatient's designated party release/ son Jamie Mitchell) home number, spoke with son, stated patient's name, date of birth, and address.  States patient is doing okay concerning what she has been through lately, still weak overall due to being in the hospital bed for approximately 2 weeks, and tires easily.   States patient has a positive attitude, he, and the rest of the family are all hanging in there, feels everything is going to be alright, remain optimistic.   Son states patient has the following scheduled appointments: on 01/15/2020 with primary care MD, with the cardiologist on 01/25/2020, and with Ear Nose Throat MD at [facility] ( sometimes next week, exact date unknown).   States he is hoping that patient's trach is temporary and cause of throat issues are still unknown.  States he is planning to contact trach supplies vendor regarding needing them to send correct trach size.    States they sent a 6 inch trach versus 4 inch trach supplies.   Son states he is aware of signs/ symptoms to report, how to reach provider if needed after hours, when to go to ED, and / or call 911.   States patient blood sugar was elevated in the hospital, even though patient is not diabetic, maintaining low carb diet, and he is testing it at home as needed.  Son in agreement to receive the following educational materials: EMMI handout ( How to Care for a Tracheostomy ) and EMMI handout ( Prediabetes).  Son states is in agreement to receive materials in [place] as needed.    Son states patient  does not have any transportation, Data processing manager, or pharmacy needs at this time.   States he is very appreciative of the follow up and is in agreement to continue to receive [facility] information / services on patient's behalf.      Objective: Per KPN (Knowledge  Performance Now, point of care tool) and chart review,patient hospitalized 12/21/2019 - 01/04/2020 for Acute respiratory failure with hypoxia, status post TRACHEOSTOMY on 12/27/2019.  Patient hospitalized 11/17/2019 - 11/20/2019 forshortness of breath,Acute respiratory failure,Glottic edema with stridor. Patient hospitalized 10/30/2019 -11/01/2019 for shortness of breath. Patient hospitalized 07/25/2019 - 08/03/2019 forLeft middle cerebral artery strokerehab. 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 Patient also has a history of Hypothyroidism,Hypertension,Prediabetes,Aphasia as late effect of cerebrovascular accident, and Dysphagia.    Encounter Medications:  Outpatient Encounter Medications as of 01/08/2020  Medication Sig  . amiodarone (PACERONE) 200 MG tablet Take 1 tablet (200 mg total) by mouth daily.  [name] atorvastatin (LIPITOR) 40 MG tablet Take 1 tablet (40 mg total) by mouth daily. (Patient taking differently: 40 mg every evening. )  . cetirizine (ZYRTEC ALLERGY) 10 MG tablet Take 1 tablet (10 mg total) by mouth 2 (two) times daily.  . diclofenac Sodium (VOLTAREN) 1 % GEL Apply 2 g topically 4 (four) times daily. (Patient taking differently: Apply 2 g topically 4 (four) times daily as needed (shoulder pain). )  . EPINEPHrine 0.3 mg/0.3 mL IJ SOAJ injection Inject 0.3 mLs (0.3 mg total) into the muscle as needed for anaphylaxis.  . famotidine (PEPCID) 20 MG tablet Take 1 tablet (20 mg total) by mouth 2 (two) times daily.  [name] ipratropium-albuterol (DUONEB) 0.5-2.5 (3) MG/3ML SOLN Take 3 mLs by nebulization every 6 (  six) hours as needed.  [name] levothyroxine (SYNTHROID) 88 MCG tablet Take 1 tablet (88 mcg total) by mouth daily before breakfast.  . metFORMIN (GLUCOPHAGE) 500 MG tablet Take 2 tablets (1,000 mg total) by mouth 2 (two) times daily with a meal.  .  midodrine (PROAMATINE) 5 MG tablet Take 1 tablet (5 mg total) by mouth 3 (three) times daily with meals.  [name] omeprazole (PRILOSEC) 40 MG capsule Take 40 mg by mouth 2 (two) times daily.   . predniSONE (DELTASONE) 10 MG tablet Take 2 tablets daily for 3 days, 1 tablet daily for 3 days, then half a tablet for 3 days   No facility-administered encounter medications on file as of 01/08/2020.    Functional Status:  In your present state of health, do you have any difficulty performing the following activities: 12/30/2019 11/18/2019  Hearing? N N  Vision? N N  Difficulty concentrating or making decisions? N N  Walking or climbing stairs? N N  Dressing or bathing? Y N  Doing errands, shopping? N N  Preparing Food and eating ? - -  Using the Toilet? - -  In the past six months, have you accidently leaked urine? - -  Do you have problems with loss of bowel control? - -  Managing your Medications? - -  Managing your Finances? - -  Housekeeping or managing your Housekeeping? - -  Some recent data might be hidden    Fall/Depression Screening: Fall Risk  11/28/2019 09/26/2019 08/11/2019  Falls in the past year? 0 1 0  Number falls in past yr: - 1 -  Injury with Fall? - 0 -  Risk for fall due to : No Fall Risks History of fall(s) -  Follow up - Falls evaluation completed -   PHQ 2/9 Scores 11/28/2019 11/15/2019 09/06/2019 09/04/2019 02/26/2017 08/24/2016 02/20/2016  PHQ - 2 Score 0 0 0 0 0 0 0  PHQ- 9 Score 0 - - 0 0 0 0  Exception Documentation - - - - - - -    Assessment: Received [facility] Liaison referral on 08/03/2019. Referral source: Nativid[facility] Brood. Referral reason: This was a hospital referral from the nutritionist/patient on [employer] family needed teaching and support -Has home health; [facility] is TOC.  Please assign to [place] Coordinator for complex care and disease management follow up calls, patient is aphasic and speaks/understands Spanish, her son is  the contact person and speaks fluent [place], his name is Jamie Mitchell, (Son) [phone] and assess for further needs. Patient was in the rehab center.Screening follow up completed,hasreferredto Education officer, museum for [employer] follow up, telephone assessment completed,andRNCM will continue to follow for care management needs. Patient's case discussed on 01/05/2020 in Multidisciplinary Case Discussion by covering RNCM Deloria Lair) with [facility] Care Management team.    Goals Addressed            This Visit's Progress   . Patient will not have any hospitalzations within the next 30 days.   Not on track    [place] (see longitudinal plan of care for additional care plan information)  Current Barriers:  [name] Knowledge Deficits related to stroke and  glottic edema.  Nurse Case Manager Clinical Goal(s):  [name] Over the next 31 days, patient will not experience hospital admission. Hospital Admissions in last 6 months = 5 . Over the next 30 days, patient will attend all scheduled medical appointments:    Interventions:  . Inter-disciplinary care team  collaboration (see longitudinal plan of care) . Collaborated with [place] Management team regarding patient's case, as needed. . Discussed plans with patient for ongoing care management follow up and provided patient with direct contact information for care management team . Provided patient with handout educational materials related to aphasia. . Reviewed scheduled/upcoming provider appointments including:  .   primary MD, allergist, cardiologist, and neurologist appointments.     Patient Self Care Activities:  . Attends all scheduled provider appointments . Performs ADL's independently . Per patient's son, patient has completed outpatient physical and occupational therapy. . Patient has aphasia, speech therapy currently on hold due to glottic edema evaluation and treatment.  Updated 01/08/2020           Plan:  RNCM will  send patient's son EMMI educational handouts per his request.  RNCM will call patient's son/ designated party release for telephone outreach attempt, within 30 business days, assessment follow up.      Chae Shuster H. Annia Friendly, BSN, [facility] Telephonic CM Phone: [phone] Fax: [phone]

## 2020-01-14 NOTE — Progress Notes (Signed)
Subjective:    Patient ID: Jamie Mitchell, female    DOB: [birth year], 74 y.o.   MRN: [id number]  01/15/2020 This is a pleasant 74 year old female seen with the assistance of Colletta Maryland for Spanish interpretation and also the patient's daughter-in-law was present.  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  She was left with aphasia chronic throat pain subglottic edema.  She was intubated during that visit.  She was found to have upon referral to ear nose and throat at [facility] under anesthesia significant post cricoid edema.  Subglottis showed a shelf of tissue and not able to visualize proximal trachea.  False vocal cords were slightly full.  True vocal cords were mobile bilaterally with complete glottal closure.  With abduction there seems to be limited inability to open and a course scarring with bilateral arytenoids affected.  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  The patient subsequently got readmitted before additional work to be done on the area of the trachea on December 21, 2019.  This is a post hospital visit for that admission.  Note primary care had seen this patient previously early in July.  During the subsequent admission in July 20 night the patient underwent tracheostomy by local ENT surgeon [facility].  She had acute respiratory failure with hypoxemia and subglottic edema noted.  The patient has discharge and now comes back to the office for post hospital follow-up.  Note she states her breathing is improved.  There is decreased cough.  There is less shortness of breath noted.  The patient denies any other complaints at this time.  She is able to swallow without difficulty.  Below is the initial admission to [facility] July  10 through July 12  [facility] Course: ([facility] 12/02/19 - 12/04/19) Patient admitted for acute hypoxic respiratory failure in the setting of presumed recurrent angioedema/subglottic edema of unknown etiology. This recurrent episodes initially started shortly after her admission in 06/2019 for acute CVA and new onset atrial fibrillation, unsure if related to intubation vs medication AE started during this time. (amiodarone and eliquis have been discontinued in the event that these medications have contributed to her symptoms). On arrival, she received IV solu-medrol, epi, and required BiPAP with significant improvement. CTA ruled out PE or infiltrate. During our evaluation, she was breathing comfortably on [place], however with significant inspiratory/expiratory wheezing and mild stridor. She was given a breathing treatment with improvement until transferred on to the floor with subsequent worsening. A rapid response was called and she required BiPAP again with minimal to no air movement heard. At that time I spoke with Dr. Tamala Julian, CCM, who accepted transfer to the ICU for close observation and considerations for intubation. Additionally heard back from ENT, Dr. Lucia Gaskins who she is seeing outpatient for this concern, who recommended transfer to [facility] for further direct laryngeal visualization. Patient was transferred back to our care when stable. Family states that she does well on high doses of Prednisone but worsens once she begins to taper. Has outpatient ENT appointment scheduled with [facility] on 7/13 at 3 PM. Was continued on 40 mg of prednisone at discharge.  Previous Report Reviewed: ER records, historical medical records and imaging reports: CTA 12/02/2019  The following portions of the patient's history were reviewed and  updated as appropriate: allergies, current medications, past family history, past medical history, past social history, past surgical history and problem  list.  Review of Systems A complete ROS was performed with pertinent positives/negatives noted in the HPI. The remainder of the ROS are negative.   Past History:   Active Ambulatory Problems  Diagnosis Date Noted  . Inspiratory stridor 12/05/2019  . Hoarseness 12/05/2019  . Acute respiratory failure with hypoxia ([place]) 12/08/2019  . 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 Atrial fibrillation with RVR ([place]) 12/08/2019  . 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 Hypertension 07/23/2015  . Hyperlipidemia 07/23/2015  . Subglottic edema 10/30/2019  . Right hemiplegia ([place]) 12/08/2019  . Hypothyroidism 01/25/2014  . Dysphagia 07/25/2019  . Type 2 diabetes mellitus ([place]) 12/08/2019  . Wheezing 12/19/2019   Below is the discharge summary from July 29 to August 12  Admit date: 12/21/2019 Discharge date: 01/04/2020 Hospital Course: 74 y.o.Spanish-speaking femalewith PMH ofCVA,Afib, DM, HTN andhypothyroidism admitted 12/21/19 for acute hypoxic respiratory failure secondary to multifocal pneumonia. 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  Plan is to downsize to a #4 cuffless per ENT recommendationand discharge home.Patient and family hasreceived trach care.  Per respiratory therapy, patient's son demonstrated require competency to take care of patient's trach.  Updated Dr. Constance Holster from ENT about patient's discharge plan.  He recommended follow-up with him in 2 weeks. PCCM also recommended follow up in their clinic in about a month. I have left voice mail at trach clinic to schedule her with Laurey Arrow in one month.   Case management was not able to secure home health for patient.  Referral to outpatient PT ordered.  See individual problem list below for more  on hospital course.  Discharge Diagnoses:  Acute hypoxic respiratory failurewith hypoxia and hypercarbia Severe sepsis due to multifocal pneumonia Subglottic stenosis/recurrent glottic edema -Completed antibiotic course. -Tracheostomy on 8/4. To cuffless, #4 on 01/03/2020 -Family received trach care teaching -Taper steroid as below -Outpatient follow-up with ENT and [place] trach clinic as above  Orthostatic hypotension:B normotensive. -Continue low-dose midodrine -Discontinued Flor[place].  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 On amiodarone and Eliquis at home -Continue amiodarone and Eliquis   Uncontrolled DM-2 with hyperglycemia: A1c 7.8%.Hyperglycemia could be due to steroid. On Metformin at home. -Increased home Metformin to 1000 mg twice daily -Tapering steroid -Continue statin  Hypothyroidism: -Continue levothyroxine  Language barrier affecting communication -Video interpreter utilized for this encounter  Note the daughter-in-law states they need help with physical therapy the patient is having difficulty walking and does not have a walker apparently in the hospitalization they try to find a home health agency and no one would take the patient's case    Past Medical History:  Diagnosis Date  . Atrial fibrillation ([place])   . 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 Hypertension   . Renal failure 07/25/2019  . Stroke ([place])   . Thyroid disease      Family History  Family history unknown: Yes     Social History   Socioeconomic History  . Marital status: Married    Spouse name: Not on file  . Number of children: Not on file  . Years of  education: Not on file  . Highest education level: Not on file  Occupational History  . Not on file  Tobacco Use  .  Smoking status: Never Smoker  . Smokeless tobacco: Never Used  Substance and Sexual Activity  . Alcohol use: No  . Drug use: No  . Sexual activity: Not Currently    Birth control/protection: None    Comment: Married  Other Topics Concern  . Not on file  Social History Narrative   Lives with son [name])    Social Determinants of Health   Financial Resource Strain:   . Difficulty of Paying Living Expenses: Not on file  Food Insecurity:   . Worried About Charity fundraiser in the Last Year: Not on file  . Ran Out of Food in the Last Year: Not on file  Transportation Needs: No Transportation Needs  . Lack of Transportation (Medical): No  . Lack of Transportation (Non-Medical): No  Physical Activity:   . Days of Exercise per Week: Not on file  . Minutes of Exercise per Session: Not on file  Stress:   . Feeling of Stress : Not on file  Social Connections:   . Frequency of Communication with Friends and Family: Not on file  . Frequency of Social Gatherings with Friends and Family: Not on file  . Attends Religious Services: Not on file  . Active Member of Clubs or Organizations: Not on file  . Attends Archivist Meetings: Not on file  . Marital Status: Not on file  Intimate Partner Violence:   . Fear of Current or Ex-Partner: Not on file  . Emotionally Abused: Not on file  . Physically Abused: Not on file  . Sexually Abused: Not on file     No Known Allergies   Outpatient Medications Prior to Visit  Medication Sig Dispense Refill  . amiodarone (PACERONE) 200 MG tablet Take 1 tablet (200 mg total) by mouth daily. 90 tablet 1  . cetirizine (ZYRTEC ALLERGY) 10 MG tablet Take 1 tablet (10 mg total) by mouth 2 (two) times daily. 30 tablet 0  . EPINEPHrine 0.3 mg/0.3 mL IJ SOAJ injection Inject 0.3 mLs (0.3 mg total) into the muscle as needed for anaphylaxis. 1 each 1  . ipratropium-albuterol (DUONEB) 0.5-2.5 (3) MG/3ML SOLN Take 3 mLs by nebulization every 6 (six) hours  as needed. 360 mL 2  . levothyroxine (SYNTHROID) 88 MCG tablet Take 1 tablet (88 mcg total) by mouth daily before breakfast. 30 tablet 3  . metFORMIN (GLUCOPHAGE) 500 MG tablet Take 2 tablets (1,000 mg total) by mouth 2 (two) times daily with a meal. 180 tablet 1  . midodrine (PROAMATINE) 5 MG tablet Take 1 tablet (5 mg total) by mouth 3 (three) times daily with meals. 90 tablet 1  . atorvastatin (LIPITOR) 40 MG tablet Take 1 tablet (40 mg total) by mouth daily. (Patient taking differently: 40 mg every evening. ) 30 tablet 0  . diclofenac Sodium (VOLTAREN) 1 % GEL Apply 2 g topically 4 (four) times daily. (Patient taking differently: Apply 2 g topically 4 (four) times daily as needed (shoulder pain). ) 100 g 3  . ELIQUIS 5 MG TABS tablet Take 5 mg by mouth 2 (two) times daily.    . famotidine (PEPCID) 20 MG tablet Take 1 tablet (20 mg total) by mouth 2 (two) times daily. 60 tablet 1  . omeprazole (PRILOSEC) 40 MG capsule Take 40 mg by mouth 2 (two) times daily.     [name]  predniSONE (DELTASONE) 10 MG tablet Take 2 tablets daily for 3 days, 1 tablet daily for 3 days, then half a tablet for 3 days (Patient not taking: Reported on 01/15/2020) 11 tablet 0   No facility-administered medications prior to visit.      Review of Systems Review of Systems  Constitutional: Positive for activity change and fatigue.  Eyes: Negative.   Respiratory: Positive for shortness of breath and stridor.   Cardiovascular: Negative for chest pain, palpitations and leg swelling.  Gastrointestinal: Negative.   Endocrine: Negative.   Genitourinary: Negative.   Musculoskeletal: Negative.   Skin: Negative.   Allergic/Immunologic: Negative.  Food allergies:   Neurological: Positive for dizziness, facial asymmetry, speech difficulty, weakness, light-headedness and numbness. Negative for tremors, seizures and syncope.  Hematological: Negative.   Psychiatric/Behavioral: Negative.       Objective:   Physical Exam  BP  119/78   Pulse 73   Temp 98.1 F (36.7 C)   Resp 16   Wt 139 lb 12.8 oz (63.4 kg)   SpO2 96%   BMI 28.24 kg/m    Gen: Pleasant, well-nourished, in no distress,  normal affect  ENT: Lurline Idol is in place without drainage no lesions,  mouth clear,  oropharynx clear, no postnasal drip  Neck: No JVD, no TMG, no carotid bruits  Lungs: No use of accessory muscles, no dullness to percussion, clear without rales or rhonchi no stridor is auscultated  Cardiovascular: RRR, heart sounds normal, no murmur or gallops, no peripheral edema  Abdomen: soft and NT, no HSM,  BS normal  Musculoskeletal: No deformities, no cyanosis or clubbing  Neuro: alert, hemiparesis right greater than left  Skin: Warm, no lesions or rashes Foot exam was normal  All recent labs reviewed in [place] link from recent hospitalizations  Microbiologic cultures were negative       Assessment & Plan:  I personally reviewed all images and lab data in the [facility] system as well as any outside material available during this office visit and agree with the  radiology impressions.   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 (HCC) s/p tPA and mechanical thrombectomy, d/t AF As per atrial fibrillation assessment note the patient has aftereffects of the stroke with right-sided hemiparesis and difficulty ambulating  The patient never was offered the benefit of physical therapy and is homebound  I will order homebound physical therapy for this patient and as well a Rollator type rolling walker  History of 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 Residual hemiparesis dictates need for homebound physical therapy  Hypertension Hypertension under good control no need for medications at this point with blood pressure today 119/78  If anything this patient tends to run low blood pressure and is on midodrine 5 mg 3 times daily  Acute hypoxemic respiratory failure ([place]) History of acute respiratory failure with tracheotomy placement now resolved  Subglottic edema Subglottic edema necessitating tracheostomy  Patient has follow-up in the [facility] health system and has an appointment this week with ENT for follow-up  Dysphagia Dysphagia and esophageal dysmotility warranting need for both Pepcid and proton pump inhibitor no swallowing has improved  Hypothyroidism Hypothyroidism continue levothyroxine  therapy  Type 2 diabetes mellitus (HCC) Diabetes mellitus now on Metformin at 1000 mg twice a day which is well controlled A1c at 6  Foot exam today was normal will obtain urine microalbumin  Residual Right hemiplegia (HCC) Residual right hemiplegia and dictating need for physical therapy at home and walker  DJD (degenerative joint disease) of knee Severe left osteoarthritis of the knee will prescribe topical diclofenac  Renal failure Renal failure has resolved  Hoarseness Due to subglottic stenosis  Inspiratory stridor Resolved with placement of trach   Jamie Mitchell was seen today for hospitalization follow-up.  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 -     For home use only DME Other see comment -     Home Health -     Face-to-face encounter (required for Medicare/Medicaid  patients)  Residual Right hemiplegia ([place]) -     For home use only DME Other see comment -     Home Health -     Face-to-face encounter (required for Medicare/Medicaid patients)  Type 2 diabetes mellitus with other circulatory complication, without long-term current use of insulin (HCC) -     Microalbumin / creatinine urine ratio  Atrial fibrillation with RVR ([place])  Essential hypertension  Acute hypoxemic respiratory failure ([place])  Subglottic edema  Pharyngeal dysphagia  Other specified hypothyroidism  Primary osteoarthritis of left knee  Acute renal failure with tubular necrosis (HCC)  Hoarseness  Inspiratory stridor  Other orders -     famotidine (PEPCID) 20 MG tablet; Take 1 tablet (20 mg total) by mouth 2 (two) times daily. -     omeprazole (PRILOSEC) 40 MG capsule; Take 1 capsule (40 mg total) by mouth 2 (two) times daily. -     atorvastatin (LIPITOR) 40 MG tablet; Take 1 tablet (40 mg total) by mouth every evening. -     ELIQUIS 5 MG TABS tablet; Take 1 tablet (5 mg total) by mouth 2 (two) times daily. -     diclofenac Sodium (VOLTAREN) 1 % GEL; Apply 2 g topically 4 (four) times daily as needed (shoulder pain). -     Flu Vaccine QUAD 36+ mos IM -     Tdap vaccine greater than or equal to 7yo IM   A flu vaccine and tetanus vaccine were given at this visit  I spent 40 minutes before during and after the clinic visit reviewing all the records in order to be able to construct this patient's timeline of complex events from her stroke in February through the referral by primary care in July to her visits with pulmonary in [facility] and [facility] in July with laryngoscopy having been done and then subsequent admissions twice in July culminating in the tracheostomy placement.  I also had to use a Spanish interpreter for this visit therefore the entire time for this visit was 60 minutes  I use complex medical decision making in order to help this patient and  explained to the daughter-in-law and the patient her current disease state.  I also recommended they keep her follow-up appointment with ear nose and throat which is actually this week at [facility].  We  will have this patient return in follow-up with her primary care within the next month.  I am going to have this patient receive home physical therapy and obtain for her rolling walker.  I had to do a complete medication review and reconciliation to determine which medicines needed renewal and I went into the chart and renewed all those meds medications

## 2020-01-15 ENCOUNTER — Ambulatory Visit: Payer: Medicare Other | Attending: Family Medicine | Admitting: Critical Care Medicine

## 2020-01-15 ENCOUNTER — Encounter: Payer: Self-pay | Admitting: Critical Care Medicine

## 2020-01-15 ENCOUNTER — Other Ambulatory Visit: Payer: Self-pay | Admitting: Critical Care Medicine

## 2020-01-15 ENCOUNTER — Other Ambulatory Visit: Payer: Self-pay

## 2020-01-15 VITALS — BP 119/78 | HR 73 | Temp 98.1°F | Resp 16 | Wt 139.8 lb

## 2020-01-15 DIAGNOSIS — I69320 Aphasia following cerebral infarction: Secondary | ICD-10-CM | POA: Diagnosis not present

## 2020-01-15 DIAGNOSIS — Z930 Tracheostomy status: Secondary | ICD-10-CM | POA: Diagnosis not present

## 2020-01-15 DIAGNOSIS — Z791 Long term (current) use of non-steroidal anti-inflammatories (NSAID): Secondary | ICD-10-CM | POA: Insufficient documentation

## 2020-01-15 DIAGNOSIS — Z7952 Long term (current) use of systemic steroids: Secondary | ICD-10-CM | POA: Diagnosis not present

## 2020-01-15 DIAGNOSIS — E785 Hyperlipidemia, unspecified: Secondary | ICD-10-CM | POA: Diagnosis not present

## 2020-01-15 DIAGNOSIS — R1313 Dysphagia, pharyngeal phase: Secondary | ICD-10-CM

## 2020-01-15 DIAGNOSIS — M1712 Unilateral primary osteoarthritis, left knee: Secondary | ICD-10-CM | POA: Diagnosis not present

## 2020-01-15 DIAGNOSIS — I69351 Hemiplegia and hemiparesis following cerebral infarction affecting right dominant side: Secondary | ICD-10-CM | POA: Diagnosis not present

## 2020-01-15 DIAGNOSIS — E038 Other specified hypothyroidism: Secondary | ICD-10-CM

## 2020-01-15 DIAGNOSIS — Z7901 Long term (current) use of anticoagulants: Secondary | ICD-10-CM | POA: Diagnosis not present

## 2020-01-15 DIAGNOSIS — I639 Cerebral infarction, unspecified: Secondary | ICD-10-CM

## 2020-01-15 DIAGNOSIS — J9601 Acute respiratory failure with hypoxia: Secondary | ICD-10-CM

## 2020-01-15 DIAGNOSIS — R061 Stridor: Secondary | ICD-10-CM

## 2020-01-15 DIAGNOSIS — Z79899 Other long term (current) drug therapy: Secondary | ICD-10-CM | POA: Insufficient documentation

## 2020-01-15 DIAGNOSIS — I480 Paroxysmal atrial fibrillation: Secondary | ICD-10-CM | POA: Diagnosis not present

## 2020-01-15 DIAGNOSIS — I951 Orthostatic hypotension: Secondary | ICD-10-CM | POA: Insufficient documentation

## 2020-01-15 DIAGNOSIS — I63412 Cerebral infarction due to embolism of left middle cerebral artery: Secondary | ICD-10-CM

## 2020-01-15 DIAGNOSIS — J384 Edema of larynx: Secondary | ICD-10-CM | POA: Diagnosis not present

## 2020-01-15 DIAGNOSIS — G8191 Hemiplegia, unspecified affecting right dominant side: Secondary | ICD-10-CM

## 2020-01-15 DIAGNOSIS — N170 Acute kidney failure with tubular necrosis: Secondary | ICD-10-CM | POA: Diagnosis not present

## 2020-01-15 DIAGNOSIS — E1165 Type 2 diabetes mellitus with hyperglycemia: Secondary | ICD-10-CM | POA: Diagnosis not present

## 2020-01-15 DIAGNOSIS — Z8701 Personal history of pneumonia (recurrent): Secondary | ICD-10-CM | POA: Insufficient documentation

## 2020-01-15 DIAGNOSIS — I4891 Unspecified atrial fibrillation: Secondary | ICD-10-CM

## 2020-01-15 DIAGNOSIS — I63512 Cerebral infarction due to unspecified occlusion or stenosis of left middle cerebral artery: Secondary | ICD-10-CM | POA: Diagnosis not present

## 2020-01-15 DIAGNOSIS — E1159 Type 2 diabetes mellitus with other circulatory complications: Secondary | ICD-10-CM | POA: Diagnosis not present

## 2020-01-15 DIAGNOSIS — R490 Dysphonia: Secondary | ICD-10-CM

## 2020-01-15 DIAGNOSIS — I10 Essential (primary) hypertension: Secondary | ICD-10-CM

## 2020-01-15 DIAGNOSIS — Z23 Encounter for immunization: Secondary | ICD-10-CM | POA: Diagnosis not present

## 2020-01-15 DIAGNOSIS — Z7984 Long term (current) use of oral hypoglycemic drugs: Secondary | ICD-10-CM | POA: Insufficient documentation

## 2020-01-15 MED ORDER — ATORVASTATIN CALCIUM 40 MG PO TABS
40.0000 mg | ORAL_TABLET | Freq: Every evening | ORAL | 3 refills | Status: DC
  Filled 2020-09-09: qty 30, 30d supply, fill #0
  Filled 2020-10-15: qty 30, 30d supply, fill #1
  Filled 2020-11-18: qty 30, 30d supply, fill #2
  Filled 2020-12-05 – 2020-12-11 (×2): qty 60, 60d supply, fill #3

## 2020-01-15 MED ORDER — DICLOFENAC SODIUM 1 % EX GEL: 2.0000 g | Freq: Four times a day (QID) | CUTANEOUS | 4 refills | Status: DC | PRN

## 2020-01-15 MED ORDER — FAMOTIDINE 20 MG PO TABS: 20.0000 mg | ORAL_TABLET | Freq: Two times a day (BID) | ORAL | 1 refills | Status: DC

## 2020-01-15 MED ORDER — ELIQUIS 5 MG PO TABS: 5.0000 mg | ORAL_TABLET | Freq: Two times a day (BID) | ORAL | 6 refills | Status: DC

## 2020-01-15 MED ORDER — OMEPRAZOLE 40 MG PO CPDR: 40.0000 mg | DELAYED_RELEASE_CAPSULE | Freq: Two times a day (BID) | ORAL | 6 refills | Status: DC

## 2020-01-15 NOTE — Assessment & Plan Note (Signed)
Hypothyroidism continue levothyroxine therapy

## 2020-01-15 NOTE — Assessment & Plan Note (Signed)
Resolved with placement of trach

## 2020-01-15 NOTE — Assessment & Plan Note (Signed)
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

## 2020-01-15 NOTE — Assessment & Plan Note (Signed)
The patient knows to follow-up with ENT at [facility] on her tracheostomy status

## 2020-01-15 NOTE — Assessment & Plan Note (Signed)
Due to subglottic stenosis

## 2020-01-15 NOTE — Assessment & Plan Note (Signed)
Subglottic edema necessitating tracheostomy  Patient has follow-up in the [place] and has an appointment this week with ENT for follow-up

## 2020-01-15 NOTE — Assessment & Plan Note (Signed)
Severe left osteoarthritis of the knee will prescribe topical diclofenac

## 2020-01-15 NOTE — Assessment & Plan Note (Signed)
Dysphagia and esophageal dysmotility warranting need for both Pepcid and proton pump inhibitor no swallowing has improved

## 2020-01-15 NOTE — Assessment & Plan Note (Signed)
Residual right hemiplegia and dictating need for physical therapy at home and walker

## 2020-01-15 NOTE — Assessment & Plan Note (Signed)
Hypertension under good control no need for medications at this point with blood pressure today 119/78  If anything this patient tends to run low blood pressure and is on midodrine 5 mg 3 times daily

## 2020-01-15 NOTE — Assessment & Plan Note (Signed)
Renal failure has resolved

## 2020-01-15 NOTE — Assessment & Plan Note (Signed)
History of acute respiratory failure with tracheotomy placement now resolved

## 2020-01-15 NOTE — Assessment & Plan Note (Signed)
Residual hemiparesis dictates need for homebound physical therapy

## 2020-01-15 NOTE — Patient Instructions (Addendum)
All refills sent to your pharmacy that are needed  A home-based physical therapist will be sent to the house  A rolling walker will be provided to help with ambulation keep your appointment  Flu shot and tetanus shot given today  A urine sample be obtained today to check for protein in the urine    Keep upcoming appt at [facility]  See Dr Margarita Rana in one month    Vacuna Td (ttanos y difteria): lo que debe saber Td (Tetanus, Diphtheria) Vaccine: What You Need to Know 1. Por qu vacunarse? La vacuna Td puede prevenir el ttanos y la difteria. El ttanos ingresa al organismo a travs de cortes o heridas. La difteria se contagia de persona a Nurse, learning disability.  El [place] (T) provoca rigidez dolorosa en los msculos. El ttanos puede causar graves problemas de [place], como no poder abrir la boca, Best boy dificultad para tragar y Ambulance person, o la muerte.  La DIFTERIA (D) puede causar dificultad para respirar, insuficiencia cardaca, parlisis o muerte. 2. Edward Jolly Td La vacuna Td es solo para nios de 7 aos en adelante, adolescentes y [place].  La vacuna Td habitualmente se aplica como dosis de refuerzo cada 10aos, pero tambin puede administrarse antes si la persona sufre una [place] o herida sucia y grave. En lugar de la vacuna Td, se puede utilizar otra vacuna llamada Tdap que, adems del ttanos y la difteria, protege contra la tos [place], tambin conocida como "tos convulsa".  La vacuna Td puede aplicarse al mismo tiempo que otras vacunas. 3. Hable con el mdico Comunquese con la persona que le coloca las vacunas si la persona que la recibe:  Ha tenido una reaccin alrgica despus de Ardelia Mems dosis anterior de cualquier vacuna contra el ttanos o la difteria, o cualquier alergia grave, potencialmente mortal.  Alguna vez tuvo sndrome de Guillain-Barr (tambin llamado SGB).  Ha tenido dolor intenso o hinchazn despus de una dosis anterior de cualquier vacuna contra el ttanos o la  difteria. En algunos casos, es posible que el mdico decida posponer la aplicacin de la vacuna Td para una visita en el futuro.  Las personas que sufren trastornos menores, como un resfro, pueden vacunarse. Las [employer] tienen enfermedades moderadas o graves generalmente deben esperar hasta recuperarse para poder recibir la vacunaTd.  Su mdico puede darle ms informacin. 4. R[place]gos de [place] reaccin a la vacuna  Despus de recibir la vacuna Td a veces se puede Patent attorney, enrojecimiento o Estate agent donde se aplic la inyeccin, fiebre leve, dolor de cabeza, sensacin de cansancio y nuseas, vmitos, diarrea o dolor de [place]. Las personas a veces se desmayan despus de procedimientos mdicos, incluida la vacunacin. Informe al mdico si se siente mareado, tiene cambios en la visin o zumbidos en los odos.  Al igual que con cualquier [employer], existe una probabilidad muy remota de que una vacuna cause una reaccin alrgica grave, otra lesin grave o la muerte. 5. Qu pasa si se presenta un problema grave? Podra producirse una reaccin alrgica despus de que la persona vacunada abandone la clnica. Si observa signos de Nurse, mental health grave (ronchas, hinchazn de la cara y la garganta, dificultad para respirar, latidos cardacos acelerados, mareos o debilidad), llame al 9-1-1 y lleve a la persona al hospital ms cercano.  Si se presentan otros signos que le preocupan, comunquese con su mdico.  Las reacciones adversas deben informarse al Sistema de Informe de Eventos Adversos de Clinical biochemist (Vaccine Adverse Event Reporting System, VAERS). Por  lo general, el mdico presenta este informe o puede hacerlo usted mismo. Visite el sitio web del VAERS en www.vaers.SamedayNews.es o llame al 714-801-4047. El VAERS es solo para Electrical engineer; su personal no proporciona asesoramiento mdico. 6. Programa Nacional de Compensacin de Daos por [place] de  Compensacin de Daos por Clinical biochemist (National Vaccine Injury Fiserv, Runner, broadcasting/film/video) es un programa federal que fue creado para Patent examiner a las personas que puedan haber sufrido daos al recibir ciertas vacunas. Visite el sitio web del VICP en GoldCloset.com.ee o llame al [phone] para obtener ms informacin acerca del programa y de cmo presentar un reclamo. Hay un lmite de tiempo para presentar un reclamo de compensacin. 7. Cmo puedo obtener ms informacin?  Pregntele a su mdico.  Comunquese con el servicio de salud de su localidad o su estado.  Comunquese con Garment/textile technologist for [employer] and Prevention, CDC (Centros para el Control y [place]): ? Llame al [phone] (1-800-CDC-INFO) o ? Visite el sitio Biomedical engineer en http://hunter.com/ Declaracin de informacin sobre la vacuna Td (05/28/2018) Esta informacin no tiene Marine scientist el consejo del mdico. Asegrese de hacerle al mdico cualquier pregunta que tenga. Document Revised: 09/20/2018 Document Reviewed: 09/20/2018 Elsevier Patient Education  [place].   Influenza Virus Vaccine injection (Fluarix) Qu es este medicamento? La VACUNA ANTIGRIPAL ayuda a disminuir el riesgo de contraer la influenza, tambin conocida como la gripe. La vacuna solo ayuda a protegerle contra algunas cepas de influenza. Esta vacuna no ayuda a reducir Catering manager de contraer influenza pandmica H1N1. Este medicamento puede ser utilizado para otros usos; si tiene alguna pregunta consulte con su proveedor de atencin mdica o con su farmacutico. MARCAS COMUNES: Fluarix, Fluzone Qu le debo informar a mi profesional de la salud antes de tomar este medicamento? Necesita saber si usted presenta alguno de los siguientes problemas o situaciones:  trastorno de sangrado como hemofilia  fiebre o infeccin  sndrome de Guillain-Barre u otros problemas neurolgicos  problemas del  sistema inmunolgico  infeccin por el virus de la inmunodeficiencia humana (VIH) o SIDA  niveles bajos de plaquetas en la sangre  esclerosis mltiple  una reaccin IT consultant o inusual a las vacunas antigripales, a los huevos, protenas de pollo, al ltex, a la gentamicina, a otros medicamentos, alimentos, colorantes o conservantes  si est embarazada o buscando quedar embarazada  si est amamantando a un beb Cmo debo [employer]? Esta vacuna se administra mediante inyeccin por va intramuscular. Lo administra un profesional de [employer]	[place]. Recibir una copia de informacin escrita sobre la vacuna antes de cada vacuna. Asegrese de leer este folleto cada vez cuidadosamente. Este folleto puede cambiar con frecuencia. Hable con su pediatra para informarse acerca del uso de este medicamento en nios. Puede requerir atencin especial. Sobredosis: Pngase en contacto inmediatamente con un centro toxicolgico o una sala de urgencia si usted cree que haya tomado demasiado medicamento. ATENCIN: [employer] es solo para usted. No comparta este medicamento con nadie. Qu sucede si me olvido de una dosis? No se aplica en este caso. Qu puede interactuar con este medicamento?  quimioterapia o radioterapia  medicamentos que suprimen el sistema inmunolgico, tales como etanercept, anakinra, infliximab y adalimumab  medicamentos que tratan o previenen cogulos sanguneos, como warfarina  fenitona  medicamentos esteroideos, como la prednisona o la cortisona  teofilina  vacunas Puede ser que esta lista no menciona todas las posibles interacciones. Informe a su profesional [place]  los productos a base de hierbas, medicamentos de venta libre o suplementos nutritivos que est tomando. Si usted fuma, consume bebidas alcohlicas o si utiliza drogas ilegales, indqueselo tambin a su profesional de [employer]	[place]. Algunas sustancias pueden interactuar con su  medicamento. A qu debo estar atento al usar [employer]? Informe a su mdico o a Barrister's clerk de la [employer] todos los efectos secundarios que persistan despus de 3 das. Llame a su proveedor de atencin mdica si se presentan sntomas inusuales dentro de las 6 semanas posteriores a la vacunacin. Es posible que todava pueda contraer la gripe, pero la enfermedad no ser tan fuerte como normalmente. No puede contraer la gripe de esta vacuna. La vacuna antigripal no le protege contra resfros u otras enfermedades que pueden causar [place]. Debe vacunarse cada ao. Qu efectos secundarios puedo tener al [employer] este medicamento? Efectos secundarios que debe informar a su mdico o a Barrister's clerk de la salud tan pronto como sea posible:  Chief of Staff como erupcin cutnea, picazn o urticarias, hinchazn de la cara, labios o lengua Efectos secundarios que, por lo general, no requieren atencin mdica (debe informarlos a su mdico o a su profesional de la salud si persisten o si son molestos):  fiebre  dolor de cabeza  molestias y Forensic psychologist, sensibilidad, enrojecimiento o Estate agent de la inyeccin  cansancio o debilidad Puede ser que esta lista no menciona todos los posibles efectos secundarios. Comunquese a su mdico por asesoramiento mdico [employer]. Usted puede informar los efectos secundarios a la FDA por telfono al 1-800-FDA-1088. Dnde debo guardar mi medicina? Esta vacuna se administra solamente en clnicas, farmacias, consultorio mdico u otro consultorio de un profesional de la salud y no Sports coach en su domicilio. ATENCIN: Este folleto es un resumen. Puede ser que no cubra toda la posible informacin. Si usted tiene preguntas acerca de esta medicina, consulte con su mdico, su farmacutico o su profesional de Technical sales engineer.  2020 Elsevier/Gold Standard (2009-11-12 15:31:40)

## 2020-01-15 NOTE — Assessment & Plan Note (Signed)
As per atrial fibrillation assessment note the patient has aftereffects of the stroke with right-sided hemiparesis and difficulty ambulating  The patient never was offered the benefit of physical therapy and is homebound  I will order homebound physical therapy for this patient and as well a Rollator type rolling walker

## 2020-01-15 NOTE — Assessment & Plan Note (Signed)
Diabetes mellitus now on Metformin at 1000 mg twice a day which is well controlled A1c at 6  Foot exam today was normal will obtain urine microalbumin

## 2020-01-16 DIAGNOSIS — R490 Dysphonia: Secondary | ICD-10-CM | POA: Diagnosis not present

## 2020-01-16 DIAGNOSIS — Z930 Tracheostomy status: Secondary | ICD-10-CM | POA: Diagnosis not present

## 2020-01-16 DIAGNOSIS — Z48813 Encounter for surgical aftercare following surgery on the respiratory system: Secondary | ICD-10-CM | POA: Diagnosis not present

## 2020-01-16 LAB — MICROALBUMIN / CREATININE URINE RATIO
Creatinine, Urine: 175.1 mg/dL
Microalb/Creat Ratio: 6 mg/g creat (ref 0–29)
Microalbumin, Urine: 10 ug/mL

## 2020-01-20 ENCOUNTER — Telehealth: Payer: Self-pay

## 2020-01-20 NOTE — Telephone Encounter (Signed)
[employer] Mickel Baas BV#[id number]  contacted pt to go over lab results pt is aware and doesn't have any questions or concerns

## 2020-01-24 NOTE — Progress Notes (Signed)
Cardiology Office Note:    Date:  01/25/2020   ID:  Jamie Mitchell 09-28-1945, MRN [id number]  PCP:  Jamie Rakes, MD  Cardiologist:  Jamie Heinz, MD  Electrophysiologist:  None   Referring MD: Jamie Rakes, MD   Chief Complaint  Patient presents with  . Atrial Fibrillation    History of Present Illness:    Jamie Mitchell is a 74 y.o. female with a hx of hypertension, HLD, and thyroid disease  who presents for follow-up.  Patient moved from [place] to [place] last month.  She was admitted on 07/19/2019 to [facility] with acute CVA.  TPA was administered, and CTA head showed M2 occlusion.  Thrombectomy and revascularization were complicated by subarachnoid hemorrhage.  She was admitted to the neuro ICU.  Course was complicated by intermittent atrial fibrillation/atrial flutter with RVR with postconversion pauses up to 8 seconds.  EP was consulted, and she was started on amiodarone.  She was discharged on Eliquis 5 mg twice daily and amiodarone 200 mg twice daily.  TTE during admission showed EF 60 to 65%, normal RV function, mild MR. After discharge from hospital, a Zio patch x14 days was done, which showed numerous sinus pauses, longest lasting 3.3 seconds.  She was referred to EP and PPM was planned.  However, this was put on hold as she was admitted to [facility] from 6/7 through 11/01/2019 with shortness of breath and stridor.  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  She received epinephrine in the ED.  She was discharged on antibiotics and a steroid taper.  She was subsequently admitted from 12/21/2019 through 01/04/2020 with acute hypoxic respiratory failure due to multifocal pneumonia.  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  She underwent tracheostomy on 8/4.  Since she was discharged from the hospital, she reports she is doing much better.  She denies any  chest pain or dyspnea.  Reports she feels fatigued but otherwise has no complaints.  Denies any lightheadedness or syncope.  Reports she is taking Eliquis, denies any bleeding issues.   Past Medical History:  Diagnosis Date  . Atrial fibrillation ([place])   . 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 Hypertension   . Renal failure 07/25/2019  . Stroke ([place])   . Thyroid disease     Past Surgical History:  Procedure Laterality Date  . IR CT HEAD LTD  07/20/2019  . IR PERCUTANEOUS ART THROMBECTOMY/INFUSION INTRACRANIAL INC DIAG ANGIO  07/20/2019  . RADIOLOGY WITH ANESTHESIA N/A 07/20/2019   Procedure: IR WITH ANESTHESIA;  Surgeon: Luanne Bras, MD;  Location: [facility];  Service: Radiology;  Laterality: N/A;  . TRACHEOSTOMY TUBE PLACEMENT N/A 12/27/2019   Procedure: TRACHEOSTOMY;  Surgeon: Izora Gala, MD;  Location: [facility];  Service: ENT;  Laterality: N/A;    Current Medications: Current Meds  Medication Sig  . amiodarone (PACERONE) 200 MG tablet Take 1 tablet (200 mg total) by mouth daily.  [name] atorvastatin (LIPITOR) 40 MG tablet Take 1 tablet (40 mg total) by mouth every evening.  . cetirizine (ZYRTEC ALLERGY) 10 MG tablet Take 1 tablet (10 mg total) by mouth 2 (two) times daily.  . diclofenac Sodium (VOLTAREN) 1 % GEL Apply 2 g topically 4 (four) times daily as needed (shoulder pain).  [name] ELIQUIS 5 MG TABS tablet Take 1 tablet (5 mg total) by mouth 2 (two) times daily.  [name] EPINEPHrine 0.3 mg/0.3 mL IJ SOAJ  injection Inject 0.3 mLs (0.3 mg total) into the muscle as needed for anaphylaxis.  . famotidine (PEPCID) 20 MG tablet Take 1 tablet (20 mg total) by mouth 2 (two) times daily.  [name] ipratropium-albuterol (DUONEB) 0.5-2.5 (3) MG/3ML SOLN Take 3 mLs by nebulization every 6 (six) hours as needed.  [name] levothyroxine (SYNTHROID) 88 MCG tablet Take 1 tablet (88 mcg total) by mouth daily before breakfast.  . metFORMIN (GLUCOPHAGE) 500 MG tablet Take 2 tablets (1,000 mg total) by mouth 2 (two) times daily with  a meal.  . midodrine (PROAMATINE) 5 MG tablet Take 1 tablet (5 mg total) by mouth 3 (three) times daily with meals.  [name] omeprazole (PRILOSEC) 40 MG capsule Take 1 capsule (40 mg total) by mouth 2 (two) times daily.     Allergies:   Patient has no known allergies.   Social History   Socioeconomic History  . Marital status: Married    Spouse name: Not on file  . Number of children: Not on file  . Years of education: Not on file  . Highest education level: Not on file  Occupational History  . Not on file  Tobacco Use  . Smoking status: Never Smoker  . Smokeless tobacco: Never Used  Substance and Sexual Activity  . Alcohol use: No  . Drug use: No  . Sexual activity: Not Currently    Birth control/protection: None    Comment: Married  Other Topics Concern  . Not on file  Social History Narrative   Lives with son [name])    Social Determinants of Health   Financial Resource Strain:   . Difficulty of Paying Living Expenses: Not on file  Food Insecurity:   . Worried About Charity fundraiser in the Last Year: Not on file  . Ran Out of Food in the Last Year: Not on file  Transportation Needs: No Transportation Needs  . Lack of Transportation (Medical): No  . Lack of Transportation (Non-Medical): No  Physical Activity:   . Days of Exercise per Week: Not on file  . Minutes of Exercise per Session: Not on file  Stress:   . Feeling of Stress : Not on file  Social Connections:   . Frequency of Communication with Friends and Family: Not on file  . Frequency of Social Gatherings with Friends and Family: Not on file  . Attends Religious Services: Not on file  . Active Member of Clubs or Organizations: Not on file  . Attends Archivist Meetings: Not on file  . Marital Status: Not on file     Family History: The patient's Family history is unknown by patient.  ROS:   Please see the history of present illness.     All other systems reviewed and are  negative.  EKGs/Labs/Other Studies Reviewed:    The following studies were reviewed today:   EKG:  EKG is ordered today.  The ekg ordered today demonstrates sinus rhythm, rate 58, no ST/T abnormalities   TTE 07/20/19: 1. Left ventricular ejection fraction, by estimation, is 60 to 65%. The  left ventricle has normal function. The left ventricle has no regional  wall motion abnormalities. Left ventricular diastolic parameters were  normal.  2. Right ventricular systolic function is normal. The right ventricular  size is normal. Tricuspid regurgitation signal is inadequate for assessing  PA pressure.  3. The mitral valve is normal in structure and function. Mild mitral  valve regurgitation. No evidence of mitral stenosis.  4. The aortic  valve is normal in structure and function. Aortic valve  regurgitation is not visualized. No aortic stenosis is present.  5. The inferior vena cava is normal in size with greater than 50%  respiratory variability, suggesting right atrial pressure of 3 mmHg.   Recent Labs: 12/21/2019: ALT 49; B Natriuretic Peptide 114.1; TSH 4.973 01/04/2020: BUN 28; Creatinine, Ser 1.00; Hemoglobin 11.0; Magnesium 2.4; Platelets 326; Potassium 3.9; Sodium 135  Recent Lipid Panel    Component Value Date/Time   CHOL 113 07/20/2019 0356   CHOL 137 02/26/2017 1035   TRIG 73 12/28/2019 0406   HDL 36 (L) 07/20/2019 0356   HDL 35 (L) 02/26/2017 1035   CHOLHDL 3.1 07/20/2019 0356   VLDL 11 07/20/2019 0356   LDLCALC 66 07/20/2019 0356   LDLCALC 49 02/26/2017 1035   LDLDIRECT 78 02/20/2016 1221    Physical Exam:    VS:  BP 122/74   Pulse 63   Ht 4\' 11"  (1.499 m)   Wt 142 lb 6.4 oz (64.6 kg)   SpO2 99%   BMI 28.76 kg/m     Wt Readings from Last 3 Encounters:  01/25/20 142 lb 6.4 oz (64.6 kg)  01/15/20 139 lb 12.8 oz (63.4 kg)  01/04/20 139 lb 8 oz (63.3 kg)     ZOX:WRUE nourished, well developed in no acute distress HEENT: Normal NECK: No JVD CARDIAC:  Bradycardic, irregular no murmurs, rubs, gallops RESPIRATORY:  Clear to auscultation without rales, wheezing or rhonchi  ABDOMEN: Soft, non-tender, non-distended MUSCULOSKELETAL:  No edema; No deformity  SKIN: Warm and dry NEUROLOGIC:  Alert and oriented x 3 PSYCHIATRIC:  Normal affect   ASSESSMENT:    1. Tachycardia-bradycardia syndrome ([place])   2. Atrial fibrillation, unspecified type (HCC)   3. Elevated TSH    PLAN:    Tachybrady syndrome: has Paroxysmal atrial fibrillation, but also having sinus pauses up to 3.3 seconds on recent monitor.  Referred to EP, planning for PPM - EP follow-up  Atrial fibrillation/atrial flutter: Intermittent episodes of AF/AFL with RVR following acute CVA that was complicated by [facility].  Had postconversion pauses up to 8 seconds during CVA admission.  Currently on Eliquis 5 mg twice daily and amiodarone 200 mg twice daily.  TTE shows normal LVEF.  No atrial fibrillation on Zio patch x14 days on 09/15/2019. -Continue Eliquis 5 mg twice daily -Continue amiodarone 200 mg daily  Elevated TSH: TSH 4.973 on 12/21/2019.  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  She underwent tracheostomy on 8/4.  RTC in 6 months  Medication Adjustments/Labs and Tests Ordered: Current medicines are reviewed at length with the patient today.  Concerns regarding medicines are outlined above.  Orders Placed This Encounter  Procedures  . TSH  . T4, free  . EKG 12-Lead   No orders of the defined types were placed in this encounter.   Patient Instructions  Medication Instructions:  Your physician recommends that you continue on your current medications as directed. Please refer to the Current Medication list given to you today.  *If you need a refill on your cardiac medications before your next appointment, please call your pharmacy*   Lab Work: TSH and freeT4 today  If you have labs (blood work) drawn today and your tests  are completely normal, you will receive your results only by: [name] MyChart Message (if you have MyChart) OR . A paper copy in the mail If you have any lab test that is abnormal or we  need to change your treatment, we will call you to review the results.    Follow-Up: At [facility], you and your health needs are our priority.  As part of our continuing mission to provide you with exceptional heart care, we have created designated Provider Care Teams.  These Care Teams include your primary Cardiologist (physician) and Advanced Practice Providers (APPs -  Physician Assistants and Nurse Practitioners) who all work together to provide you with the care you need, when you need it.  We recommend signing up for the patient portal called "MyChart".  Sign up information is provided on this After Visit Summary.  MyChart is used to connect with patients for Virtual Visits (Telemedicine).  Patients are able to view lab/test results, encounter notes, upcoming appointments, etc.  Non-urgent messages can be sent to your provider as well.   To learn more about what you can do with MyChart, go to [email or web address].    Your next appointment:   6 month(s)  The format for your next appointment:   In Person  Provider:   Oswaldo Milian, MD   Other Instructions Dr. Tanna Furry office will contact you to arrange a follow up appointment     Signed, Jamie Heinz, MD  01/25/2020 5:18 PM    [place]

## 2020-01-25 ENCOUNTER — Other Ambulatory Visit: Payer: Self-pay

## 2020-01-25 ENCOUNTER — Encounter: Payer: Self-pay | Admitting: Cardiology

## 2020-01-25 ENCOUNTER — Ambulatory Visit (INDEPENDENT_AMBULATORY_CARE_PROVIDER_SITE_OTHER): Payer: Medicare Other | Admitting: Cardiology

## 2020-01-25 VITALS — BP 122/74 | HR 63 | Ht 59.0 in | Wt 142.4 lb

## 2020-01-25 DIAGNOSIS — R7989 Other specified abnormal findings of blood chemistry: Secondary | ICD-10-CM

## 2020-01-25 DIAGNOSIS — I4891 Unspecified atrial fibrillation: Secondary | ICD-10-CM

## 2020-01-25 DIAGNOSIS — I495 Sick sinus syndrome: Secondary | ICD-10-CM

## 2020-01-25 NOTE — Patient Instructions (Signed)
Medication Instructions:  Your physician recommends that you continue on your current medications as directed. Please refer to the Current Medication list given to you today.  *If you need a refill on your cardiac medications before your next appointment, please call your pharmacy*   Lab Work: TSH and freeT4 today  If you have labs (blood work) drawn today and your tests are completely normal, you will receive your results only by: [name] MyChart Message (if you have MyChart) OR . A paper copy in the mail If you have any lab test that is abnormal or we need to change your treatment, we will call you to review the results.    Follow-Up: At [facility], you and your health needs are our priority.  As part of our continuing mission to provide you with exceptional heart care, we have created designated Provider Care Teams.  These Care Teams include your primary Cardiologist (physician) and Advanced Practice Providers (APPs -  Physician Assistants and Nurse Practitioners) who all work together to provide you with the care you need, when you need it.  We recommend signing up for the patient portal called "MyChart".  Sign up information is provided on this After Visit Summary.  MyChart is used to connect with patients for Virtual Visits (Telemedicine).  Patients are able to view lab/test results, encounter notes, upcoming appointments, etc.  Non-urgent messages can be sent to your provider as well.   To learn more about what you can do with MyChart, go to [email or web address].    Your next appointment:   6 month(s)  The format for your next appointment:   In Person  Provider:   Oswaldo Milian, MD   Other Instructions Dr. Tanna Furry office will contact you to arrange a follow up appointment

## 2020-01-26 LAB — T4, FREE: Free T4: 2.11 ng/dL — ABNORMAL HIGH (ref 0.82–1.77)

## 2020-01-26 LAB — TSH: TSH: 2.63 u[IU]/mL (ref 0.450–4.500)

## 2020-01-30 ENCOUNTER — Telehealth: Payer: Self-pay

## 2020-01-31 ENCOUNTER — Other Ambulatory Visit: Payer: Self-pay | Admitting: *Deleted

## 2020-01-31 ENCOUNTER — Ambulatory Visit: Payer: Medicare Other | Admitting: *Deleted

## 2020-01-31 NOTE — Patient Outreach (Addendum)
[facility]) Care Management  01/31/2020  Jamie Mitchell June 17, 1945 [id number]   Subjective: Telephone call to patient's designated party release/ son [facility] number, no answer, left HIPAA compliant voicemail message, and requested call back.    Objective: Per KPN (Knowledge Performance Now, point of care tool) and chart review,patient hospitalized 12/21/2019 - 01/04/2020 for Acute respiratory failure with hypoxia, status post TRACHEOSTOMY on 12/27/2019.  Patient hospitalized 11/17/2019 - 11/20/2019 forshortness of breath,Acute respiratory failure,Glottic edema with stridor. Patient hospitalized 10/30/2019 -11/01/2019 for shortness of breath. Patient hospitalized 07/25/2019 - 08/03/2019 forLeft middle cerebral artery strokerehab. 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 Patient also has a history of Hypothyroidism,Hypertension,Prediabetes,Aphasia as late effect of cerebrovascular accident, and Dysphagia.     Assessment: Received [facility] Liaison referral on 08/03/2019. Referral source: Natividad Brood. Referral reason: This was a hospital referral from the nutritionist/patient on SSI family needed teaching and support -Has home health; [facility] is T[facility].  Please assign to [place] Coordinator for complex care and disease management follow up calls, patient is aphasic and speaks/understands Spanish, her son is the contact person and speaks fluent [place], his name is [name], (Son) [phone] and assess for further needs. Patient was in the rehab center.Screening follow up completed,hasreferredto Education officer, museum for [employer] follow up, telephone assessment completed,andRNCM will continue to follow for care management needs. Patient's case discussed on 01/05/2020  in Multidisciplinary Case Discussionby covering RNCM Deloria Lair) with [facility] Care Management team.    Plan:  RNCM has sent patient's son EMMI educational handouts per his request and will verify receipt, at next successful outreach.  RNCM will call patient's son/ designated party release for 2nd telephone outreach attempt, within 4 business days, assessment follow up.      Trevar Boehringer H. Annia Friendly, BSN, [place] Management [facility] Telephonic CM Phone: [phone] Fax: [phone]

## 2020-02-01 ENCOUNTER — Encounter: Payer: Self-pay | Admitting: Internal Medicine

## 2020-02-01 ENCOUNTER — Ambulatory Visit (INDEPENDENT_AMBULATORY_CARE_PROVIDER_SITE_OTHER): Payer: Medicare Other | Admitting: Internal Medicine

## 2020-02-01 ENCOUNTER — Other Ambulatory Visit: Payer: Self-pay

## 2020-02-01 VITALS — BP 134/72 | HR 71 | Ht 59.0 in | Wt 142.0 lb

## 2020-02-01 DIAGNOSIS — I10 Essential (primary) hypertension: Secondary | ICD-10-CM | POA: Diagnosis not present

## 2020-02-01 DIAGNOSIS — I495 Sick sinus syndrome: Secondary | ICD-10-CM

## 2020-02-01 DIAGNOSIS — I639 Cerebral infarction, unspecified: Secondary | ICD-10-CM | POA: Diagnosis not present

## 2020-02-01 NOTE — Progress Notes (Signed)
HPI Jamie Mitchell returns today for followup. She is a pleasant 74 yo woman with a h/o HTN, thyroid dysfunction and sinus node dysfunction with pauses of 3.5 seconds. She has subsequently developed upper airway difficulties leading to the insertion of a tracheostomy. She has done well since with no syncope or near syncope. No symptomatic atrial fib. She denies chest pain or sob. No edema. Appetite is good. No Known Allergies   Current Outpatient Medications  Medication Sig Dispense Refill   amiodarone (PACERONE) 200 MG tablet Take 1 tablet (200 mg total) by mouth daily. 90 tablet 1   atorvastatin (LIPITOR) 40 MG tablet Take 1 tablet (40 mg total) by mouth every evening. 90 tablet 3   cetirizine (ZYRTEC ALLERGY) 10 MG tablet Take 1 tablet (10 mg total) by mouth 2 (two) times daily. 30 tablet 0   diclofenac Sodium (VOLTAREN) 1 % GEL Apply 2 g topically 4 (four) times daily as needed (shoulder pain). 50 g 4   ELIQUIS 5 MG TABS tablet Take 1 tablet (5 mg total) by mouth 2 (two) times daily. 60 tablet 6   EPINEPHrine 0.3 mg/0.3 mL IJ SOAJ injection Inject 0.3 mLs (0.3 mg total) into the muscle as needed for anaphylaxis. 1 each 1   famotidine (PEPCID) 20 MG tablet Take 1 tablet (20 mg total) by mouth 2 (two) times daily. 60 tablet 1   ipratropium-albuterol (DUONEB) 0.5-2.5 (3) MG/3ML SOLN Take 3 mLs by nebulization every 6 (six) hours as needed. 360 mL 2   levothyroxine (SYNTHROID) 88 MCG tablet Take 1 tablet (88 mcg total) by mouth daily before breakfast. 30 tablet 3   metFORMIN (GLUCOPHAGE) 500 MG tablet Take 2 tablets (1,000 mg total) by mouth 2 (two) times daily with a meal. 180 tablet 1   midodrine (PROAMATINE) 5 MG tablet Take 1 tablet (5 mg total) by mouth 3 (three) times daily with meals. 90 tablet 1   omeprazole (PRILOSEC) 40 MG capsule Take 1 capsule (40 mg total) by mouth 2 (two) times daily. 60 capsule 6   No current facility-administered medications for this visit.      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   Past Surgical History:  Procedure Laterality Date   IR CT HEAD LTD  07/20/2019   IR PERCUTANEOUS ART THROMBECTOMY/INFUSION INTRACRANIAL INC DIAG ANGIO  07/20/2019   RADIOLOGY WITH ANESTHESIA N/A 07/20/2019   Procedure: IR WITH ANESTHESIA;  Surgeon: Luanne Bras, MD;  Location: [place];  Service: Radiology;  Laterality: N/A;   TRACHEOSTOMY TUBE PLACEMENT N/A 12/27/2019   Procedure: TRACHEOSTOMY;  Surgeon: Izora Gala, MD;  Location: [facility] OR;  Service: ENT;  Laterality: N/A;     Family History  Family history unknown: Yes     Social History   Socioeconomic History   Marital status: Married    Spouse name: Not on file   Number of children: Not on file   Years of education: Not on file   Highest education level: Not on file  Occupational History   Not on file  Tobacco Use   Smoking status: Never Smoker   Smokeless tobacco: Never Used  Substance and Sexual Activity   Alcohol use: No   Drug use: No  Sexual activity: Not Currently    Birth control/protection: None    Comment: Married  Other Topics Concern   Not on file  Social History Narrative   Lives with son [name])    Social Determinants of Health   Financial Resource Strain:    Difficulty of Paying Living Expenses: Not on file  Food Insecurity:    Worried About Charity fundraiser in the Last Year: Not on file   [employer] of Food in the Last Year: Not on file  Transportation Needs: No Transportation Needs   Lack of Transportation (Medical): No   Lack of Transportation (Non-Medical): No  Physical Activity:    Days of Exercise per Week: Not on file   Minutes of Exercise per Session: Not on file  Stress:    Feeling of Stress :  Not on file  Social Connections:    Frequency of Communication with Friends and Family: Not on file   Frequency of Social Gatherings with Friends and Family: Not on file   Attends Religious Services: Not on file   Active Member of Clubs or Organizations: Not on file   Attends Archivist Meetings: Not on file   Marital Status: Not on file  Intimate Partner Violence:    Fear of Current or Ex-Partner: Not on file   Emotionally Abused: Not on file   Physically Abused: Not on file   Sexually Abused: Not on file     BP 134/72    Pulse 71    Ht 4\' 11"  (1.499 m)    Wt 142 lb (64.4 kg)    SpO2 97%    BMI 28.68 kg/m   Physical Exam:  Well appearing NAD HEENT: Unremarkable Neck:  No JVD, no thyromegally Lymphatics:  No adenopathy Back:  No CVA tenderness Lungs:  Clear with no wheezes HEART:  Regular rate rhythm, no murmurs, no rubs, no clicks Abd:  soft, positive bowel sounds, no organomegally, no rebound, no guarding Ext:  2 plus pulses, no edema, no cyanosis, no clubbing Skin:  No rashes no nodules Neuro:  CN II through XII intact, motor grossly intact  Assess/Plan: 1. Sinus node dysfunction - she is asymptomatic. She will undergo watchful waiting. With her increased infection risk, we will not proceed with PPM. 2. PAF - she appears to be maintaining NSR on amiodarone. 3. 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 Hopefully the trach could be reversed. 4. HTN - her bp is well controlled. No change in meds.  Carleene Overlie Zameer Borman,MD

## 2020-02-01 NOTE — Patient Instructions (Addendum)
Medication Instructions:  Your physician recommends that you continue on your current medications as directed. Please refer to the Current Medication list given to you today.  Labwork: None ordered.  Testing/Procedures: None ordered.  Follow-Up: Your physician wants you to follow-up in: as needed with Dr. Taylor.      Any Other Special Instructions Will Be Listed Below (If Applicable).  If you need a refill on your cardiac medications before your next appointment, please call your pharmacy.   

## 2020-02-02 NOTE — Telephone Encounter (Signed)
No pacemaker indicated at this time.

## 2020-02-05 ENCOUNTER — Other Ambulatory Visit: Payer: Self-pay | Admitting: *Deleted

## 2020-02-05 NOTE — Patient Outreach (Signed)
[facility]) Care Management  02/05/2020  Jamie Mitchell August 07, 1945 [id number]   Subjective: Telephone call to patient's designated party release/ son Jamie Mitchell, no answer, left HIPAA compliant voicemail message, and requested call back.    Objective:Per KPN (Knowledge Performance Now, point of care tool) and chart review,patient hospitalized 12/21/2019- 8/12/2021for Acute respiratory failure with hypoxia, status postTRACHEOSTOMYon 12/27/2019. Patient hospitalized 11/17/2019 - 11/20/2019 forshortness of breath,Acute respiratory failure,Glottic edema with stridor. Patient hospitalized 10/30/2019 -11/01/2019 for shortness of breath. Patient hospitalized 07/25/2019 - 08/03/2019 forLeft middle cerebral artery strokerehab. 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 Patient also has a history of Hypothyroidism,Hypertension,Prediabetes,Aphasia as late effect of cerebrovascular accident, and Dysphagia.    Jamie Mitchell referral on 08/03/2019. Referral source: Jamie Mitchell. Referral reason: This was a hospital referral from the nutritionist/patient on SSI family needed teaching and support -Has home health; [facility] is TOC.  Please assign to [place] Coordinator for complex care and disease management follow up calls, patient is aphasic and speaks/understands Spanish, her son is the contact person and speaks fluent [place], his name is Jamie Mitchell, (Son) [phone] and assess for further needs. Patient was in the rehab center.Screening follow up completed,hasreferredto Education officer, museum for [employer] follow up, telephone assessmentcompleted,andRNCM will continue to follow for care management needs. Patient's case  discussedon8/13/2021 inMultidisciplinary Case Discussionby covering RNCM ([place] Management team.     Plan:RNCM has sent patient's son EMMI educational handouts per his request and will verify receipt, at next successful outreach. RNCM will send unsuccessful outreach letter, [facility] pamphlet, will call patient's son/ designated party releasefor 3rd telephone outreach attempt, within 4business days, assessmentfollow up.      Jamie Mitchell H. Annia Friendly, BSN, [place] Management [facility] Telephonic CM Phone: [phone] Fax: [phone]

## 2020-02-07 ENCOUNTER — Encounter: Payer: Medicare Other | Admitting: Internal Medicine

## 2020-02-08 ENCOUNTER — Ambulatory Visit: Payer: Self-pay | Admitting: *Deleted

## 2020-02-09 ENCOUNTER — Other Ambulatory Visit: Payer: Self-pay | Admitting: *Deleted

## 2020-02-09 NOTE — Patient Outreach (Signed)
[facility] [facility]) Care Management  [place]  02/09/2020   Jamie Mitchell [birth year] [id number]  Subjective:  Telephone calltopatient's designated party release/ son [name] number, spoke with son, stated patient's name, date of birth, and address.States patient is doingokay, is having some periodic vomiting after she eats, has been off, and off for approximately 1 week.  States patient has no pain  no fever,and does not want to eat after the vomiting episode for the rest of the day.   States he has contacted patient's primary MD and has a follow up appointment for evaluation of this issue on 02/14/2020.  Son states he is aware of signs/ symptoms to report, how to reach provider if needed after hours, when to go to ED, and / or call 911.   States he is planning today to call MD's office to see if patient can be seen sooner since vomiting is increasing in frequency (has occurred 3 days in a row).   States he will contact this RNCM is assistance needed to get sooner appointment.   Son states patients follow up appointment since last conversation with this RNCM have all went well.   States he has received EMMI educational materials and does not have questions regarding information.  Son states patient  does not have any Air traffic controller,  transportation, Data processing manager, or pharmacy needs at this time. States he is very appreciative of the follow up and is in agreement to continue to receive [place] Management information / services on patient's behalf.     Objective: Per KPN (Knowledge Performance Now, point of care tool) and chart review,patient hospitalized 12/21/2019- 8/12/2021for Acute respiratory failure with hypoxia, status postTRACHEOSTOMYon 12/27/2019. Patient hospitalized 11/17/2019 - 11/20/2019 forshortness of breath,Acute respiratory failure,Glottic edema with stridor. Patient hospitalized 10/30/2019 -11/01/2019 for shortness of breath.  Patient hospitalized 07/25/2019 - 08/03/2019 forLeft middle cerebral artery strokerehab. 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 Patient also has a history of Hypothyroidism,Hypertension,Prediabetes,Aphasia as late effect of cerebrovascular accident, and Dysphagia.    Encounter Medications:  Outpatient Encounter Medications as of 02/09/2020  Medication Sig  . amiodarone (PACERONE) 200 MG tablet Take 1 tablet (200 mg total) by mouth daily.  [name] atorvastatin (LIPITOR) 40 MG tablet Take 1 tablet (40 mg total) by mouth every evening.  . cetirizine (ZYRTEC ALLERGY) 10 MG tablet Take 1 tablet (10 mg total) by mouth 2 (two) times daily.  . diclofenac Sodium (VOLTAREN) 1 % GEL Apply 2 g topically 4 (four) times daily as needed (shoulder pain).  [name] ELIQUIS 5 MG TABS tablet Take 1 tablet (5 mg total) by mouth 2 (two) times daily.  [name] EPINEPHrine 0.3 mg/0.3 mL IJ SOAJ injection Inject 0.3 mLs (0.3 mg total) into the muscle as needed for anaphylaxis.  . famotidine (PEPCID) 20 MG tablet Take 1 tablet (20 mg total) by mouth 2 (two) times daily.  [name] ipratropium-albuterol (DUONEB) 0.5-2.5 (3) MG/3ML SOLN Take 3 mLs by nebulization every 6 (six) hours as needed.  [name] levothyroxine (SYNTHROID) 88 MCG tablet Take 1 tablet (88 mcg total) by mouth daily before breakfast.  . metFORMIN (GLUCOPHAGE) 500 MG tablet Take 2 tablets (1,000 mg total) by mouth 2 (two) times daily with a meal.  . midodrine (PROAMATINE) 5 MG tablet Take 1 tablet (5 mg total) by mouth 3 (three) times daily with meals.  [name] omeprazole (PRILOSEC) 40 MG capsule Take 1 capsule (40 mg  total) by mouth 2 (two) times daily.   No facility-administered encounter medications on file as of 02/09/2020.    Functional Status:  In your present state of health, do you have any difficulty performing the following activities:  12/30/2019 11/18/2019  Hearing? N N  Vision? N N  Difficulty concentrating or making decisions? N N  Walking or climbing stairs? N N  Dressing or bathing? Y N  Doing errands, shopping? N N  Preparing Food and eating ? - -  Using the Toilet? - -  In the past six months, have you accidently leaked urine? - -  Do you have problems with loss of bowel control? - -  Managing your Medications? - -  Managing your Finances? - -  Housekeeping or managing your Housekeeping? - -  Some recent data might be hidden    Fall/Depression Screening: Fall Risk  11/28/2019 09/26/2019 08/11/2019  Falls in the past year? 0 1 0  Number falls in past yr: - 1 -  Injury with Fall? - 0 -  Risk for fall due to : No Fall Risks History of fall(s) -  Follow up - Falls evaluation completed -   PHQ 2/9 Scores 11/28/2019 11/15/2019 09/06/2019 09/04/2019 02/26/2017 08/24/2016 02/20/2016  PHQ - 2 Score 0 0 0 0 0 0 0  PHQ- 9 Score 0 - - 0 0 0 0  Exception Documentation - - - - - - -    Assessment: Received [facility] Liaison referral on 08/03/2019. Referral source: Natividad Brood. Referral reason: This was a hospital referral from the nutritionist/patient on SSI family needed teaching and support -Has home health; [facility] is TOC.  Please assign to [place] Coordinator for complex care and disease management follow up calls, patient is aphasic and speaks/understands Spanish, her son is the contact person and speaks fluent [place], his name is [name], (Son) [phone] and assess for further needs. Patient was in the rehab center.Screening follow up completed,hasreferredto Education officer, museum for [employer] follow up, telephone assessmentcompleted,andRNCM will continue to follow for care management needs. Patient's case discussedon8/13/2021 inMultidisciplinary Case Discussionby covering RNCM ([place] Management team.   Goals Addressed             This Visit's Progress   . Patient will not have any hospitalzations within the next 30 days.       CARE PLAN ENTRY (see longitudinal plan of care for additional care plan information)  Current Barriers:  [name] Knowledge Deficits related to stroke and  glottic edema.  Nurse Case Manager Clinical Goal(s):  [name] Over the next 31 days, patient will not experience hospital admission. Hospital Admissions in last 6 months = 5 . Over the next 30 days, patient will attend all scheduled medical appointments:    Interventions:  . Inter-disciplinary care team collaboration (see longitudinal plan of care) . Provided education to patient re: (patient's son, signs and symptoms to report to MD, follow up for sooner appointment as needed.  Nash Dimmer with [place] Management team regarding patient's case, as needed. . Discussed plans with patient for ongoing care management follow up and provided patient with direct contact information for care management team . Provided patient with handout educational materials related to aphasia. . Reviewed scheduled/upcoming provider appointments including:  .   primary MD, allergist, cardiologist, and neurologist appointments.     Patient Self Care Activities:  . Attends all scheduled provider appointments . Performs ADL's independently . Calls  provider office for new concerns or questions . Per patient's son, patient has completed outpatient physical and occupational therapy. . Patient has aphasia, speech therapy currently on hold due to glottic edema evaluation and treatment.  Updated 02/09/2020          Plan:RNCM will call patient's son/ designated party releasefortelephone outreach attempt, within21 business days, assessmentfollow up, will proceed with case closure, after 4th unsuccessful outreach call.      Harding Thomure H. Annia Friendly, BSN, [place] Management [facility] Telephonic CM Phone: [phone] Fax: [phone]

## 2020-02-14 ENCOUNTER — Ambulatory Visit: Payer: Medicare Other | Attending: Family Medicine | Admitting: Family Medicine

## 2020-02-14 ENCOUNTER — Encounter: Payer: Self-pay | Admitting: Family Medicine

## 2020-02-14 DIAGNOSIS — R112 Nausea with vomiting, unspecified: Secondary | ICD-10-CM

## 2020-02-14 DIAGNOSIS — I4891 Unspecified atrial fibrillation: Secondary | ICD-10-CM | POA: Diagnosis not present

## 2020-02-14 DIAGNOSIS — I639 Cerebral infarction, unspecified: Secondary | ICD-10-CM | POA: Diagnosis not present

## 2020-02-14 DIAGNOSIS — Z930 Tracheostomy status: Secondary | ICD-10-CM | POA: Diagnosis not present

## 2020-02-14 DIAGNOSIS — I63412 Cerebral infarction due to embolism of left middle cerebral artery: Secondary | ICD-10-CM | POA: Diagnosis not present

## 2020-02-14 DIAGNOSIS — E038 Other specified hypothyroidism: Secondary | ICD-10-CM

## 2020-02-14 DIAGNOSIS — I69320 Aphasia following cerebral infarction: Secondary | ICD-10-CM | POA: Diagnosis not present

## 2020-02-14 MED ORDER — PROMETHAZINE HCL 25 MG PO TABS: 25.0000 mg | ORAL_TABLET | Freq: Three times a day (TID) | ORAL | 1 refills | Status: DC | PRN

## 2020-02-14 NOTE — Progress Notes (Signed)
Daughter(Claudia Marlowe Sax) is on the line states that patient has lost her ability to speak.  She has had a stoke in february and she has been having problems.  Discharged with a trach and she sates that she has been vomiting more frequently after she eats.

## 2020-02-14 NOTE — Progress Notes (Signed)
Virtual Visit via Telephone Note  I connected with Jamie Mitchell, on 02/14/2020 at 8:34 AM by telephone due to the COVID-19 pandemic and verified that I am speaking with the correct person using two identifiers.   Consent: I discussed the limitations, risks, security and privacy concerns of performing an evaluation and management service by telephone and the availability of in person appointments. I also discussed with the patient that there may be a patient responsible charge related to this service. The patient expressed understanding and agreed to proceed.   Location of Patient: Home  Location of Provider: Clinic   Persons participating in Telemedicine visit: Jamie Mitchell Jamie Mitchell Dr. 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 Also found to have hypothyroidism and commenced on Levothyroxine. She had several episodes of acute respiratory distress secondary to subglottic stenosis with subsequent tracheostomy placement.  Daughter in law provides the history and complains patient has been vomiting since discharge. Symptoms have been present for 3 weeks and occurs 1-2x/times which is sometimes related to her meals and occurs. Patient denies nausea, vomiting. She is currently on Omeprazole and Famotidine. The patient has also been weak and fatigued since discharge and this has progressively worsened.   Fasting blood sugars are 110-134 and she is compliant with Metformin. Last seen by Cardiology 2 weeks ago with no change in regimen. ENT  follow up comes up next month.    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 90 tablet 1   atorvastatin (LIPITOR) 40 MG tablet Take 1 tablet (40 mg total) by mouth every evening. 90 tablet 3   cetirizine (ZYRTEC ALLERGY) 10 MG tablet Take 1 tablet (10 mg total) by mouth 2 (two) times daily. 30 tablet 0   diclofenac Sodium (VOLTAREN) 1 % GEL Apply 2 g topically 4 (four) times daily as needed (shoulder pain). 50 g 4   ELIQUIS 5 MG TABS tablet Take 1 tablet (5 mg total) by mouth 2 (two) times daily. 60 tablet 6   EPINEPHrine 0.3 mg/0.3 mL IJ SOAJ injection Inject 0.3 mLs (0.3 mg total) into the muscle as needed for anaphylaxis. 1 each 1   famotidine (PEPCID) 20 MG tablet Take 1 tablet (20 mg total) by mouth 2 (two) times daily. 60 tablet 1   ipratropium-albuterol (DUONEB) 0.5-2.5 (3) MG/3ML SOLN Take 3 mLs by nebulization every 6 (six) hours as needed. 360 mL 2   levothyroxine (SYNTHROID) 88 MCG tablet Take 1 tablet (88 mcg total) by mouth daily before breakfast. 30 tablet 3   metFORMIN (GLUCOPHAGE) 500 MG tablet Take 2 tablets (1,000 mg total) by mouth 2 (two) times daily with a meal. 180 tablet 1   midodrine (PROAMATINE) 5 MG tablet Take 1 tablet (5 mg total) by mouth 3 (three) times daily with  meals. 90 tablet 1   omeprazole (PRILOSEC) 40 MG capsule Take 1 capsule (40 mg total) by mouth 2 (two) times daily. 60 capsule 6   No current facility-administered medications on file prior to visit.    Observations/Objective: Awake alert, no evidence of distress  Lab Results  Component Value Date   HGBA1C 7.8 (H) 01/03/2020    Assessment and Plan: 1.  Atrial fibrillation with RVR (HCC) Stable Continue anticoagulation with Eliquis  Followed by cardiology  2. Non-intractable vomiting with nausea, unspecified vomiting type Unknown etiology We will check electrolytes especially given fatigue Antiemetic added to regimen Might need to be screened for H. pylori but she is currently on a PPI Referred to GI as she might need an upper endoscopy as well - promethazine (PHENERGAN) 25 MG tablet; Take 1 tablet (25 mg total) by mouth every 8 (eight) hours as needed for nausea or vomiting.  Dispense: 60 tablet; Refill: 1 - Ambulatory referral to Gastroenterology - Basic Metabolic Panel  3. 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 Tracheostomy status ([place]) Stable Keep appointment with ENT  5. Other specified hypothyroidism Controlled Continue levothyroxine  6. Aphasia as late effect of cerebrovascular accident (CVA) Risk factor modification   Follow Up Instructions: 1 month   I discussed the assessment and treatment plan with the patient. The patient was provided an opportunity to ask questions and all were answered. The patient agreed with the plan and demonstrated an understanding of the instructions.   The patient was advised to call back or seek an in-person evaluation if the symptoms worsen or if the condition fails to improve as anticipated.     I provided 22 minutes total of non-face-to-face time during this encounter including median intraservice time, reviewing previous notes, investigations, ordering medications, medical decision making, coordinating care and patient verbalized understanding at the end of the visit.     Charlott Rakes, MD, FAAFP. [facility],  [place]   02/14/2020, 8:34 AM

## 2020-02-15 ENCOUNTER — Telehealth: Payer: Self-pay

## 2020-02-15 LAB — CBC WITH DIFFERENTIAL/PLATELET
Basophils Absolute: 0.1 10*3/uL (ref 0.0–0.2)
Basos: 1 %
EOS (ABSOLUTE): 0 10*3/uL (ref 0.0–0.4)
Eos: 1 %
Hematocrit: 39.6 % (ref 34.0–46.6)
Hemoglobin: 12.7 g/dL (ref 11.1–15.9)
Immature Grans (Abs): 0 10*3/uL (ref 0.0–0.1)
Immature Granulocytes: 1 %
Lymphocytes Absolute: 2.7 10*3/uL (ref 0.7–3.1)
Lymphs: 46 %
MCH: 30.6 pg (ref 26.6–33.0)
MCHC: 32.1 g/dL (ref 31.5–35.7)
MCV: 95 fL (ref 79–97)
Monocytes Absolute: 0.4 10*3/uL (ref 0.1–0.9)
Monocytes: 8 %
Neutrophils Absolute: 2.5 10*3/uL (ref 1.4–7.0)
Neutrophils: 43 %
Platelets: 324 10*3/uL (ref 150–450)
RBC: 4.15 x10E6/uL (ref 3.77–5.28)
RDW: 15.2 % (ref 11.7–15.4)
WBC: 5.8 10*3/uL (ref 3.4–10.8)

## 2020-02-15 LAB — BASIC METABOLIC PANEL
BUN/Creatinine Ratio: 6 — ABNORMAL LOW (ref 12–28)
BUN: 6 mg/dL — ABNORMAL LOW (ref 8–27)
CO2: 25 mmol/L (ref 20–29)
Calcium: 9 mg/dL (ref 8.7–10.3)
Chloride: 102 mmol/L (ref 96–106)
Creatinine, Ser: 1 mg/dL (ref 0.57–1.00)
GFR calc Af Amer: 65 mL/min/{1.73_m2} (ref >59)
GFR calc non Af Amer: 57 mL/min/{1.73_m2} — ABNORMAL LOW (ref >59)
Glucose: 88 mg/dL (ref 65–99)
Potassium: 4.6 mmol/L (ref 3.5–5.2)
Sodium: 140 mmol/L (ref 134–144)

## 2020-02-15 NOTE — Telephone Encounter (Signed)
-----   Message from Charlott Rakes, MD sent at 02/15/2020 12:43 PM EDT ----- Please inform the patient that labs are normal. Thank you.

## 2020-02-15 NOTE — Telephone Encounter (Signed)
Patient name and DOB has been verified Patient was informed of lab results. Patient had no questions.  

## 2020-02-19 ENCOUNTER — Ambulatory Visit: Payer: Medicare Other | Admitting: Family Medicine

## 2020-02-23 ENCOUNTER — Other Ambulatory Visit (HOSPITAL_COMMUNITY): Payer: Medicare Other

## 2020-02-23 ENCOUNTER — Other Ambulatory Visit: Payer: Self-pay | Admitting: *Deleted

## 2020-02-23 NOTE — Patient Outreach (Signed)
[facility]) Care Management  [place]  02/23/2020   Lucine Bilski 09-Aug-1945 [id number]  Subjective: Telephone calltopatient's designated party release/ son Maryjean Morn, spoke with son,stated patient's name, date of birth, and address.States patient is doing a little bit better, 02/14/2020 follow up appointment with primary MD, went okay, it was a virtual visit, and son prefers in person visit.   Son is planning to speak with provider's office regarding his preference of in-person appointment, if possible due to the nature of patient's current condition.  Per [place] Link/ Epic appointment desk, patient's next appointment with primary MD is on 03/19/2020.    Son states patient has been referred to gastroenterologist to evaluate stomach issues/ vomiting and he has not received a call from the office regarding appointment status.   Son states he will follow up with gastroenterologist office if he has not received a follow up call by end of business on 02/26/2020.  RNCM advised son, moving to another position after 03/07/2020, patient will be transitioned to another [facility] in the future.  Son states he will discuss with patient to determine follow up needs/ frequency.  RNCM will call son for 1 additional outreach prior to transition, per son's request.  Son states patient does not have any Air traffic controller,  transportation, Data processing manager, or pharmacy needs at this time. States he is very appreciative of the follow up and is in agreement to continue to receive [place] Management information / services on patient's behalf.    Objective: Per KPN (Knowledge Performance Now, point of care tool) and chart review,patient hospitalized 12/21/2019- 8/12/2021for Acute respiratory failure with hypoxia, status postTRACHEOSTOMYon 12/27/2019. Patient hospitalized 11/17/2019 - 11/20/2019 forshortness of breath,Acute respiratory failure,Glottic edema  with stridor. Patient hospitalized 10/30/2019 -11/01/2019 for shortness of breath. Patient hospitalized 07/25/2019 - 08/03/2019 forLeft middle cerebral artery strokerehab. 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 Patient also has a history of Hypothyroidism,Hypertension,Prediabetes,Aphasia as late effect of cerebrovascular accident, and Dysphagia.    Encounter Medications:  Outpatient Encounter Medications as of 02/23/2020  Medication Sig  . amiodarone (PACERONE) 200 MG tablet Take 1 tablet (200 mg total) by mouth daily.  [name] atorvastatin (LIPITOR) 40 MG tablet Take 1 tablet (40 mg total) by mouth every evening.  . cetirizine (ZYRTEC ALLERGY) 10 MG tablet Take 1 tablet (10 mg total) by mouth 2 (two) times daily.  . diclofenac Sodium (VOLTAREN) 1 % GEL Apply 2 g topically 4 (four) times daily as needed (shoulder pain).  [name] ELIQUIS 5 MG TABS tablet Take 1 tablet (5 mg total) by mouth 2 (two) times daily.  [name] EPINEPHrine 0.3 mg/0.3 mL IJ SOAJ injection Inject 0.3 mLs (0.3 mg total) into the muscle as needed for anaphylaxis.  . famotidine (PEPCID) 20 MG tablet Take 1 tablet (20 mg total) by mouth 2 (two) times daily.  [name] ipratropium-albuterol (DUONEB) 0.5-2.5 (3) MG/3ML SOLN Take 3 mLs by nebulization every 6 (six) hours as needed.  [name] levothyroxine (SYNTHROID) 88 MCG tablet Take 1 tablet (88 mcg total) by mouth daily before breakfast.  . metFORMIN (GLUCOPHAGE) 500 MG tablet Take 2 tablets (1,000 mg total) by mouth 2 (two) times daily with a meal.  . midodrine (PROAMATINE) 5 MG tablet Take 1 tablet (5 mg total) by mouth 3 (three) times daily with meals.  [name] omeprazole (PRILOSEC) 40 MG capsule Take 1 capsule (40 mg total) by mouth 2 (two) times daily.  [name]  promethazine (PHENERGAN) 25 MG tablet Take 1 tablet (25 mg total) by mouth every 8 (eight) hours as needed for nausea or  vomiting.   No facility-administered encounter medications on file as of 02/23/2020.    Functional Status:  In your present state of health, do you have any difficulty performing the following activities: 12/30/2019 11/18/2019  Hearing? N N  Vision? N N  Difficulty concentrating or making decisions? N N  Walking or climbing stairs? N N  Dressing or bathing? Y N  Doing errands, shopping? N N  Preparing Food and eating ? - -  Using the Toilet? - -  In the past six months, have you accidently leaked urine? - -  Do you have problems with loss of bowel control? - -  Managing your Medications? - -  Managing your Finances? - -  Housekeeping or managing your Housekeeping? - -  Some recent data might be hidden    Fall/Depression Screening: Fall Risk  02/14/2020 11/28/2019 09/26/2019  Falls in the past year? 0 0 1  Number falls in past yr: - - 1  Injury with Fall? - - 0  Risk for fall due to : - No Fall Risks History of fall(s)  Follow up - - Falls evaluation completed   PHQ 2/9 Scores 11/28/2019 11/15/2019 09/06/2019 09/04/2019 02/26/2017 08/24/2016 02/20/2016  PHQ - 2 Score 0 0 0 0 0 0 0  PHQ- 9 Score 0 - - 0 0 0 0  Exception Documentation - - - - - - -    Assessment: Received [facility] Liaison referral on 08/03/2019. Referral source: Natividad Brood. Referral reason: This was a hospital referral from the nutritionist/patient on [employer]SI family needed teaching and support -Has home health; [facility] is T[facility].  Please assign to [place] Coordinator for complex care and disease management follow up calls, patient is aphasic and speaks/understands Spanish, her son is the contact person and speaks fluent [place], his name is [name], (Son) [phone] and assess for further needs. Patient was in the rehab center.Screening follow up completed,hasreferredto Education officer, museum for [employer] follow up, telephone assessmentcompleted,andRNCM will continue to  follow for care management needs. Patient's case discussedon8/13/2021 inMultidisciplinary Case Discussionby covering RNCM ([place] Management team.    Goals Addressed            This Visit's Progress   . Patient will not have any hospitalzations within the next 30 days.   On track    [place] (see longitudinal plan of care for additional care plan information)  Current Barriers:  [name] Knowledge Deficits related to stroke and  glottic edema.  Nurse Case Manager Clinical Goal(s):  [name] Over the next 31 days, patient will not experience hospital admission. Hospital Admissions in last 6 months = 5 . Over the next 30 days, patient will attend all scheduled medical appointments:    Interventions:  . Inter-disciplinary care team collaboration (see longitudinal plan of care) . Provided education to patient re: (patient's son, signs and symptoms to report to MD, follow up for sooner appointment as needed.  Nash Dimmer with [place] Management team regarding patient's case, as needed. . Discussed plans with patient for ongoing care management follow up and provided patient with direct contact information for care management team . Provided patient with handout educational materials related to aphasia. . Reviewed scheduled/upcoming provider appointments including:  .   primary MD, and gastroenterologist appointments.   . Discussed RNCM  transition plan and next steps for patient outreach with patient's son   Patient Self Care Activities:  . Attends all scheduled provider appointments . Performs ADL's independently . Calls provider office for new concerns or questions . Per patient's son, patient has completed outpatient physical and occupational therapy. . Patient has aphasia, speech therapy currently on hold due to glottic edema evaluation and treatment. . Patient's son will follow up with gastroenterologist referral status   Updated  10/ 05/2019          Plan: RNCM will call patient's son/ designated party releasefortelephone outreach attempt, within21 business days, assessmentfollow up, will proceed with case closure, after 4th unsuccessful outreach call.      Iyanla Eilers H. Annia Friendly, BSN, [place] Management [facility] Telephonic CM Phone: [phone] Fax: [phone]

## 2020-02-26 ENCOUNTER — Other Ambulatory Visit (HOSPITAL_COMMUNITY)
Admission: RE | Admit: 2020-02-26 | Discharge: 2020-02-26 | Disposition: A | Discharge status: Home / Self Care | Payer: Medicare Other | Source: Ambulatory Visit | Attending: Family Medicine | Admitting: Family Medicine

## 2020-02-26 DIAGNOSIS — Z20822 Contact with and (suspected) exposure to covid-19: Secondary | ICD-10-CM | POA: Diagnosis not present

## 2020-02-26 DIAGNOSIS — Z01812 Encounter for preprocedural laboratory examination: Secondary | ICD-10-CM | POA: Insufficient documentation

## 2020-02-26 LAB — SARS CORONAVIRUS 2 (TAT 6-24 HRS): SARS Coronavirus 2: NEGATIVE

## 2020-02-27 DIAGNOSIS — I4891 Unspecified atrial fibrillation: Secondary | ICD-10-CM | POA: Diagnosis not present

## 2020-02-27 DIAGNOSIS — E039 Hypothyroidism, unspecified: Secondary | ICD-10-CM | POA: Diagnosis not present

## 2020-02-27 DIAGNOSIS — R0602 Shortness of breath: Secondary | ICD-10-CM | POA: Diagnosis not present

## 2020-02-27 DIAGNOSIS — R0609 Other forms of dyspnea: Secondary | ICD-10-CM | POA: Diagnosis not present

## 2020-02-27 DIAGNOSIS — Z7901 Long term (current) use of anticoagulants: Secondary | ICD-10-CM | POA: Diagnosis not present

## 2020-02-27 DIAGNOSIS — Z930 Tracheostomy status: Secondary | ICD-10-CM | POA: Diagnosis not present

## 2020-02-27 DIAGNOSIS — J386 Stenosis of larynx: Secondary | ICD-10-CM | POA: Diagnosis not present

## 2020-02-27 DIAGNOSIS — R1110 Vomiting, unspecified: Secondary | ICD-10-CM | POA: Diagnosis not present

## 2020-02-27 DIAGNOSIS — I10 Essential (primary) hypertension: Secondary | ICD-10-CM | POA: Diagnosis not present

## 2020-02-27 DIAGNOSIS — Z8673 Personal history of transient ischemic attack (TIA), and cerebral infarction without residual deficits: Secondary | ICD-10-CM | POA: Diagnosis not present

## 2020-02-27 DIAGNOSIS — Z7984 Long term (current) use of oral hypoglycemic drugs: Secondary | ICD-10-CM | POA: Diagnosis not present

## 2020-02-27 DIAGNOSIS — E119 Type 2 diabetes mellitus without complications: Secondary | ICD-10-CM | POA: Diagnosis not present

## 2020-02-28 ENCOUNTER — Ambulatory Visit (HOSPITAL_COMMUNITY)
Admission: RE | Admit: 2020-02-28 | Discharge: 2020-02-28 | Disposition: A | Discharge status: Home / Self Care | Payer: Medicare Other | Source: Ambulatory Visit | Attending: Acute Care | Admitting: Acute Care

## 2020-02-28 ENCOUNTER — Other Ambulatory Visit: Payer: Self-pay

## 2020-02-28 DIAGNOSIS — Z930 Tracheostomy status: Secondary | ICD-10-CM | POA: Insufficient documentation

## 2020-02-28 DIAGNOSIS — J386 Stenosis of larynx: Secondary | ICD-10-CM | POA: Diagnosis not present

## 2020-02-28 DIAGNOSIS — Z4682 Encounter for fitting and adjustment of non-vascular catheter: Secondary | ICD-10-CM | POA: Insufficient documentation

## 2020-02-28 NOTE — Progress Notes (Addendum)
Tracheostomy Procedure Note  Jamie Mitchell [id number] 22-Aug-1945  Pre Procedure Tracheostomy Information  Trach Brand: Shiley Size: 4.0 Style: Uncuffed Secured by: Velcro   Procedure: Trach cleaning and Trach change    Post Procedure Tracheostomy Information  Trach Brand: Shiley Size: 4.0 Style: Uncuffed Secured by: Velcro   Post Procedure Evaluation:  ETCO2 positive color change from yellow to purple : Yes.   Vital signs:VSS Patients current condition: stable Complications: No apparent complications Trach site exam: clean and dry Wound care done: 4 x 4 gauze dain gauze Patient did tolerate procedure well. Pt given new PMV to take home  Education: none  Prescription needs: none    Additional needs: none

## 2020-02-28 NOTE — Progress Notes (Signed)
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 She has been followed by [facility] since her dc in august but presents to the trach clinic today. She is accompanied by her family and spanish interpreter. She is unclear as to why she is here but does report she has had same trach In place since her discharge back in august.  ROS  Review of Systems - History obtained from the patient General ROS: negative ENT ROS: negative Endocrine ROS: negative Respiratory ROS: no cough, shortness of breath, or wheezing Cardiovascular ROS: no chest pain or dyspnea on exertion Gastrointestinal ROS: no abdominal pain, change in bowel habits, or black or bloody stools Genito-Urinary ROS: no dysuria, trouble voiding, or hematuria Musculoskeletal ROS: negative Neurological ROS: no TIA or stroke symptoms  Vital signs:  Reviewed  Exam:  Physical Exam Vitals reviewed.  Constitutional:      General: She is not in acute distress.    Appearance: Normal appearance. She is normal weight.  HENT:     Head: Normocephalic.     Nose: Nose normal.     Mouth/Throat:     Mouth: Mucous membranes are moist.  Neck:     Comments: Trach site unremarkable. Her stoma is unremarkable  Cardiovascular:     Rate and Rhythm: Normal rate and regular rhythm.  Pulmonary:     Effort: Pulmonary effort is normal.     Breath sounds: Normal breath sounds.  Abdominal:     General: Abdomen is flat.     Palpations: Abdomen is soft.  Musculoskeletal:        General: Normal range of motion.  Skin:    General: Skin is warm and dry.     Capillary Refill: Capillary refill takes less than 2 seconds.     Trach change/procedure: # 4 cuffless trach removed. Current site evaluated and is unremarkable. New cuffless trach replaced w/out difficulty       Impression/dx  Trach dependence  Subglottic stenosis  Vocal cord dysfxn  Discussion   Trach placed 8/4 for upper airway obstruction and associated respiratory failure. Now remains trach dependant and followed at [facility]. Continues to see [facility] for vocal cord movement and assessment. Last dictation notes she does have some limitations to her VC abduction. She had not followed w/ SLP but it was felt she may also have a functional element of her vocal cord dysfxn.  I am doubtful that she will be able to be decannulated safely.    last dictated transnasal laryngoscopy 7/13: Vocal folds are mobile bilaterally, however there appears to be limited abduction possibly due to interarytenoid scaring. Subglottis is only able to be partially visualized   Plan   Continue routine trach care  ROV 3 months for routine trach change I have reached out to Dr Rachel Moulds at [facility]. We will need to coordinate. I am happy to continue to take care of the trach and routine management of it BUT given her history I would not even consider decannulation unless that were to be recommended by the [facility] team. If she is not a candidate for decannulation we can take care of all her needs here. If they think that this is still an option with time and therapy I will ask that we continue to share her care or that they  take on her care all together if that is the preference.    Visit time: 39  minutes.   Erick Colace ACNP-BC [place]

## 2020-03-04 ENCOUNTER — Other Ambulatory Visit: Payer: Self-pay | Admitting: *Deleted

## 2020-03-04 NOTE — Patient Outreach (Signed)
[facility]) Care Management  03/04/2020  Jamie Mitchell [birth year] [id number]   Subjective: Telephone calltopatient's designated party release/ son [facility] Jamie Mitchell, no answer, left HIPAA compliant voicemail message, and requested call back.    Objective: Per KPN (Knowledge Performance Now, point of care tool) and chart review,patient hospitalized 12/21/2019- 8/12/2021for Acute respiratory failure with hypoxia, status postTRACHEOSTOMYon 12/27/2019. Patient hospitalized 11/17/2019 - 11/20/2019 forshortness of breath,Acute respiratory failure,Glottic edema with stridor. Patient hospitalized 10/30/2019 -11/01/2019 for shortness of breath. Patient hospitalized 07/25/2019 - 08/03/2019 forLeft middle cerebral artery strokerehab. 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 Patient also has a history of Hypothyroidism,Hypertension,Prediabetes,Aphasia as late effect of cerebrovascular accident, and Dysphagia.    Assessment: Received [facility] Liaison referral on 08/03/2019. Referral source: Natividad Brood. Referral reason: This was a hospital referral from the nutritionist/patient on [employer]SI family needed teaching and support -Has home health; [facility] is T[facility].  Please assign to [place] Coordinator for complex care and disease management follow up calls, patient is aphasic and speaks/understands Spanish, her son is the contact person and speaks fluent [place], his name is Jamie Mitchell, (Son) [phone] and assess for further needs. Patient was in the rehab center.Screening follow up completed,hasreferredto Education officer, museum for [employer] follow up, telephone assessmentcompleted,andRNCM will continue to follow for care management needs. Patient's case  discussedon8/13/2021 inMultidisciplinary Case Discussionby covering RNCM ([place] Management team.     Plan: RNCM will call patient's son/ designated party releasefor 2nd telephone outreach attempt, within4 business days, assessmentfollow up, will proceed with case closure, after 4th unsuccessful outreach call.      Jamie Mitchell H. Annia Friendly, BSN, [place] Management [facility] Telephonic CM Phone: [phone] Fax: [phone]

## 2020-03-05 ENCOUNTER — Other Ambulatory Visit: Payer: Self-pay | Admitting: *Deleted

## 2020-03-05 NOTE — Patient Outreach (Signed)
[facility]) Care Management  03/05/2020  Jamie Mitchell Jan 11, 1946 [id number]   Subjective: Telephone calltopatient's designated party release/ son [facility] [name], no answer, left HIPAA compliant voicemail message, and requested call back.    Objective:Per KPN (Knowledge Performance Now, point of care tool) and chart review,patient hospitalized 12/21/2019- 8/12/2021for Acute respiratory failure with hypoxia, status postTRACHEOSTOMYon 12/27/2019. Patient hospitalized 11/17/2019 - 11/20/2019 forshortness of breath,Acute respiratory failure,Glottic edema with stridor. Patient hospitalized 10/30/2019 -11/01/2019 for shortness of breath. Patient hospitalized 07/25/2019 - 08/03/2019 forLeft middle cerebral artery strokerehab. 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 Patient also has a history of Hypothyroidism,Hypertension,Prediabetes,Aphasia as late effect of cerebrovascular accident, and Dysphagia.    [facility] Liaison referral on 08/03/2019. Referral source: Natividad Brood. Referral reason: This was a hospital referral from the nutritionist/patient on [employer] family needed teaching and support -Has home health; [facility] is T[facility].  Please assign to [place] Coordinator for complex care and disease management follow up calls, patient is aphasic and speaks/understands Spanish, her son is the contact person and speaks fluent [place], his name is Jamie Mitchell, (Son) [phone] and assess for further needs. Patient was in the rehab center.Screening follow up completed,hasreferredto Education officer, museum for [employer] follow up, telephone assessmentcompleted,andRNCM will continue to follow for care management needs. Patient's case  discussedon8/13/2021 inMultidisciplinary Case Discussionby covering RNCM ([place] Management team.     Plan:RNCM will call patient's son/ designated party releasefor 3rd telephone outreach attempt, within4 business days, assessmentfollow up, will proceed with case closure, after 4th unsuccessful outreach call.      Sherril Shipman H. Annia Friendly, BSN, [place] Management [facility] Telephonic CM Phone: [phone] Fax: [phone]

## 2020-03-06 ENCOUNTER — Other Ambulatory Visit: Payer: Self-pay | Admitting: *Deleted

## 2020-03-06 NOTE — Patient Outreach (Signed)
[facility]) Care Management  [place]  03/06/2020   Jamie Mitchell 1946-01-22 [id number]  Subjective: Telephone calltopatient's designated party release/ son Maryjean Morn, spoke with son,stated patient's name, date of birth, and address.Son states he is in agreement to brief follow up.  States patient is doing a little bit better, has follow up appointments on 02/27/2020 and 02/28/2020 regarding trach, appointments went well.   Son states he is planning to follow up with patient's [facility] providers to verify which providers [facility] or [facility] will follow up on trach going forward.    States patient is scheduled for outpatient speech therapy at [facility] on on 03/07/2020.  Son states he spoke with patient,  patient in agreement to be transitioned to another [facility], for continued care management services, since this RNCM will be transitioning to another position.    Son given [place] [facility] main office number and will contact office, if he has not received call from newly assigned RNCM by 03/28/2020.   Son states he never received Advanced Directives packet from [place] [facility] Social Worker.  RNCM advised will send son Advanced Directives packet, per his request.  Son states patient does not have any education material,  transportation, or pharmacy needs at this time.  States he is very Patent attorney of this RNCM's services/  follow up and is in agreement to continue to receive [place] Management information / services, on patient's behalf.      Objective: Per KPN (Knowledge Performance Now, point of care tool) and chart review,patient hospitalized 12/21/2019- 8/12/2021for Acute respiratory failure with hypoxia, status postTRACHEOSTOMYon 12/27/2019. Patient hospitalized 11/17/2019 - 11/20/2019 forshortness of breath,Acute respiratory failure,Glottic edema with stridor. Patient hospitalized 10/30/2019 -11/01/2019 for shortness of  breath. Patient hospitalized 07/25/2019 - 08/03/2019 forLeft middle cerebral artery strokerehab. 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 Patient also has a history of Hypothyroidism,Hypertension,Prediabetes,Aphasia as late effect of cerebrovascular accident, and Dysphagia.   Encounter Medications:  Outpatient Encounter Medications as of 03/06/2020  Medication Sig   amiodarone (PACERONE) 200 MG tablet Take 1 tablet (200 mg total) by mouth daily.   atorvastatin (LIPITOR) 40 MG tablet Take 1 tablet (40 mg total) by mouth every evening.   cetirizine (ZYRTEC ALLERGY) 10 MG tablet Take 1 tablet (10 mg total) by mouth 2 (two) times daily.   diclofenac Sodium (VOLTAREN) 1 % GEL Apply 2 g topically 4 (four) times daily as needed (shoulder pain).   ELIQUIS 5 MG TABS tablet Take 1 tablet (5 mg total) by mouth 2 (two) times daily.   EPINEPHrine 0.3 mg/0.3 mL IJ SOAJ injection Inject 0.3 mLs (0.3 mg total) into the muscle as needed for anaphylaxis.   famotidine (PEPCID) 20 MG tablet Take 1 tablet (20 mg total) by mouth 2 (two) times daily.   ipratropium-albuterol (DUONEB) 0.5-2.5 (3) MG/3ML SOLN Take 3 mLs by nebulization every 6 (six) hours as needed.   levothyroxine (SYNTHROID) 88 MCG tablet Take 1 tablet (88 mcg total) by mouth daily before breakfast.   metFORMIN (GLUCOPHAGE) 500 MG tablet Take 2 tablets (1,000 mg total) by mouth 2 (two) times daily with a meal.   midodrine (PROAMATINE) 5 MG tablet Take 1 tablet (5 mg total) by mouth 3 (three) times daily with meals.   omeprazole (PRILOSEC) 40 MG capsule Take 1 capsule (40 mg total) by mouth 2 (two) times daily.   promethazine (PHENERGAN) 25 MG tablet  Take 1 tablet (25 mg total) by mouth every 8 (eight) hours as needed for nausea or vomiting.   No facility-administered encounter medications on file as of  03/06/2020.    Functional Status:  In your present state of health, do you have any difficulty performing the following activities: 12/30/2019 11/18/2019  Hearing? N N  Vision? N N  Difficulty concentrating or making decisions? N N  Walking or climbing stairs? N N  Dressing or bathing? Y N  Doing errands, shopping? N N  Some recent data might be hidden    Fall/Depression Screening: Fall Risk  02/14/2020 11/28/2019 09/26/2019  Falls in the past year? 0 0 1  Number falls in past yr: - - 1  Injury with Fall? - - 0  Risk for fall due to : - No Fall Risks History of fall(s)  Follow up - - Falls evaluation completed   PHQ 2/9 Scores 11/28/2019 11/15/2019 09/06/2019 09/04/2019 02/26/2017 08/24/2016 02/20/2016  PHQ - 2 Score 0 0 0 0 0 0 0  PHQ- 9 Score 0 - - 0 0 0 0  Exception Documentation - - - - - - -    Assessment: Received [facility] Liaison referral on 08/03/2019. Referral source: Natividad Brood. Referral reason: This was a hospital referral from the nutritionist/patient on SSI family needed teaching and support -Has home health; [facility] is TOC.  Please assign to [facility] Coordinator for complex care and disease management follow up calls, patient is aphasic and speaks/understands Spanish, her son is the contact person and speaks fluent [place], his name is [name], (Son) [phone] and assess for further needs. Patient was in the rehab center.Screening follow up completed,hasreferredto Education officer, museum for [employer] follow up, telephone assessmentcompleted,andRNCM will continue to follow for care management needs. Patient's case discussedon8/13/2021 inMultidisciplinary Case Discussionby covering RNCM ([place] Management team.   Goals Addressed            This Visit's Progress    Patient will not have any hospitalzations within the next 30 days.   On track    [place] (see longitudinal plan of  care for additional care plan information)  Current Barriers:   Knowledge Deficits related to stroke and  glottic edema.  Nurse Case Manager Clinical Goal(s):   Over the next 31 days, patient will not experience hospital admission. Hospital Admissions in last 6 months = 5  Over the next 30 days, patient will attend all scheduled medical appointments:    Interventions:   Inter-disciplinary care team collaboration (see longitudinal plan of care)  Provided education to patient and/or caregiver about advanced directives  Provided education to patient re: (patient's son, signs and symptoms to report to MD, follow up for sooner appointment as needed.   Collaborated with [place] Management team regarding patient's case, as needed.  Discussed plans with patient for ongoing care management follow up and provided patient with direct contact information for care management team  Provided patient with handout educational materials related to aphasia.  Reviewed scheduled/upcoming provider appointments including:     speech therapy and gastroenterologist appointments.    Discussed RNCM transition plan and next steps for patient outreach with patient's son   Patient Self Care Activities:   Attends all scheduled provider appointments  Performs ADL's independently  Calls provider office for new concerns or questions  Per patient's son, patient has completed outpatient physical and occupational therapy.  Patient has aphasia, speech therapy  currently on hold due to glottic edema evaluation and treatment.  Patient's son will follow up with gastroenterologist referral status   Updated  10/ 13/2021         Plan: RNCM will send patient resource letter with Advanced Directives packet, per patient's request.  Newly assigned RNCM will call patient's son/ designated party releasefor telephone outreach attempt, within30 business days, assessmentfollow up, will proceed with case closure,  after 4th unsuccessful outreach call.      Laine Fonner H. Annia Friendly, BSN, [place] Management [facility] Telephonic CM Phone: [phone] Fax: [phone]

## 2020-03-07 DIAGNOSIS — Z930 Tracheostomy status: Secondary | ICD-10-CM | POA: Diagnosis not present

## 2020-03-07 DIAGNOSIS — R490 Dysphonia: Secondary | ICD-10-CM | POA: Diagnosis not present

## 2020-03-07 DIAGNOSIS — J3802 Paralysis of vocal cords and larynx, bilateral: Secondary | ICD-10-CM | POA: Diagnosis not present

## 2020-03-07 DIAGNOSIS — J383 Other diseases of vocal cords: Secondary | ICD-10-CM | POA: Diagnosis not present

## 2020-03-13 ENCOUNTER — Telehealth: Payer: Self-pay

## 2020-03-13 DIAGNOSIS — J3802 Paralysis of vocal cords and larynx, bilateral: Secondary | ICD-10-CM | POA: Diagnosis not present

## 2020-03-13 DIAGNOSIS — J383 Other diseases of vocal cords: Secondary | ICD-10-CM | POA: Diagnosis not present

## 2020-03-13 DIAGNOSIS — Z930 Tracheostomy status: Secondary | ICD-10-CM | POA: Diagnosis not present

## 2020-03-13 DIAGNOSIS — R490 Dysphonia: Secondary | ICD-10-CM | POA: Diagnosis not present

## 2020-03-13 NOTE — Telephone Encounter (Signed)
Patient called to make a medication refill for  midodrine (PROAMATINE) 5 MG tablet   Patient daughter states she only has 1 pill left for tomorrow.   Patient uses  [facility]   Please advice [phone]

## 2020-03-14 NOTE — Telephone Encounter (Signed)
Medication was prescribed by ED doctor.

## 2020-03-14 NOTE — Telephone Encounter (Signed)
She does have hypertension and does not need to remain on this.  I will need to review her blood pressure without this medication at her upcoming visit in a few days and make a decision regarding continuation or discontinuation.

## 2020-03-15 NOTE — Telephone Encounter (Signed)
Medication concerns will be addressed at upcoming office visit on 03/19/2020

## 2020-03-19 ENCOUNTER — Ambulatory Visit: Payer: Medicare Other | Attending: Family Medicine | Admitting: Family Medicine

## 2020-03-19 ENCOUNTER — Other Ambulatory Visit: Payer: Self-pay

## 2020-03-19 ENCOUNTER — Encounter: Payer: Self-pay | Admitting: Family Medicine

## 2020-03-19 VITALS — BP 135/83 | HR 69 | Ht 59.0 in | Wt 136.0 lb

## 2020-03-19 DIAGNOSIS — R112 Nausea with vomiting, unspecified: Secondary | ICD-10-CM | POA: Diagnosis not present

## 2020-03-19 DIAGNOSIS — I4891 Unspecified atrial fibrillation: Secondary | ICD-10-CM

## 2020-03-19 DIAGNOSIS — I63512 Cerebral infarction due to unspecified occlusion or stenosis of left middle cerebral artery: Secondary | ICD-10-CM

## 2020-03-19 DIAGNOSIS — I639 Cerebral infarction, unspecified: Secondary | ICD-10-CM

## 2020-03-19 DIAGNOSIS — E038 Other specified hypothyroidism: Secondary | ICD-10-CM

## 2020-03-19 DIAGNOSIS — Z1321 Encounter for screening for nutritional disorder: Secondary | ICD-10-CM

## 2020-03-19 DIAGNOSIS — R5383 Other fatigue: Secondary | ICD-10-CM | POA: Diagnosis not present

## 2020-03-19 DIAGNOSIS — E1159 Type 2 diabetes mellitus with other circulatory complications: Secondary | ICD-10-CM

## 2020-03-19 DIAGNOSIS — E559 Vitamin D deficiency, unspecified: Secondary | ICD-10-CM | POA: Diagnosis not present

## 2020-03-19 LAB — GLUCOSE, POCT (MANUAL RESULT ENTRY): POC Glucose: 98 mg/dl (ref 70–99)

## 2020-03-19 NOTE — Patient Instructions (Signed)
[facility] [phone] 

## 2020-03-19 NOTE — Progress Notes (Signed)
Refill request for midodrine   She still is having vomiting. Has not seen GI yet.  She has been weak and tired.

## 2020-03-19 NOTE — Progress Notes (Signed)
Subjective:  Patient ID: Jamie Mitchell, female    DOB: April 18, 1946  Age: 74 y.o. MRN: [id number]  CC: Diabetes   HPI Jamie Mitchell a 74 year old [place] a history of Left middle cerebral artery strokein 2/2021with residual aphasia status postTPA during hospitalization;thrombectomy and revascularization complicated by subarachnoid hemorrhage, hypothyroidism, Type 2 Diabetes Mellitus paroxysmal atrial fibrillation with RVR for on amiodarone and Eliquis.  I had referred her to GI last month due to vomiting which daughter-in-law states is still present and she is yet to see GI.  Omeprazole and famotidine have been ineffective.  She has early satiety but denies presence of abdominal pain, constipation or diarrhea. She is also weak; gets tired after walking a few steps  Even after resting well  Echocardiogram from 06/2019 revealed EF of 60 to 65% no regional wall motion abnormalities, normal LV function. Seen at [facility]  trach clinic on 02/27/2020 with plans for trach changes every 3 months. Seen at the A. fib clinic last month with plans for medical management of both A. fib and tachybradycardia syndrome. Requests refill of midodrine however her blood pressure is normal at 135/83 without midodrine.  Past Medical History:  Diagnosis Date  . Atrial fibrillation ([place])   . 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 Hypertension   . Renal failure 07/25/2019  . Stroke ([place])   . Thyroid disease     Past Surgical History:  Procedure Laterality Date  . IR CT HEAD LTD  07/20/2019  . IR PERCUTANEOUS ART THROMBECTOMY/INFUSION INTRACRANIAL INC DIAG ANGIO  07/20/2019  . RADIOLOGY WITH ANESTHESIA N/A 07/20/2019   Procedure: IR WITH ANESTHESIA;  Surgeon: Luanne Bras, MD;  [facility];  Service: Radiology;  Laterality: N/A;  . TRACHEOSTOMY TUBE PLACEMENT N/A 12/27/2019   Procedure: TRACHEOSTOMY;  Surgeon: Izora Gala, MD;  Location: [place];  Service: ENT;   Laterality: N/A;    Family History  Family history unknown: Yes    No Known Allergies  Outpatient Medications Prior to Visit  Medication Sig Dispense Refill  . amiodarone (PACERONE) 200 MG tablet Take 1 tablet (200 mg total) by mouth daily. 90 tablet 1  . atorvastatin (LIPITOR) 40 MG tablet Take 1 tablet (40 mg total) by mouth every evening. 90 tablet 3  . cetirizine (ZYRTEC ALLERGY) 10 MG tablet Take 1 tablet (10 mg total) by mouth 2 (two) times daily. 30 tablet 0  . diclofenac Sodium (VOLTAREN) 1 % GEL Apply 2 g topically 4 (four) times daily as needed (shoulder pain). 50 g 4  . ELIQUIS 5 MG TABS tablet Take 1 tablet (5 mg total) by mouth 2 (two) times daily. 60 tablet 6  . EPINEPHrine 0.3 mg/0.3 mL IJ SOAJ injection Inject 0.3 mLs (0.3 mg total) into the muscle as needed for anaphylaxis. 1 each 1  . famotidine (PEPCID) 20 MG tablet Take 1 tablet (20 mg total) by mouth 2 (two) times daily. 60 tablet 1  . ipratropium-albuterol (DUONEB) 0.5-2.5 (3) MG/3ML SOLN Take 3 mLs by nebulization every 6 (six) hours as needed. 360 mL 2  . levothyroxine (SYNTHROID) 88 MCG tablet Take 1 tablet (88 mcg total) by mouth daily before breakfast. 30 tablet 3  . metFORMIN (GLUCOPHAGE) 500 MG tablet Take 2 tablets (1,000 mg total) by mouth 2 (two) times daily with a meal. 180 tablet 1  . omeprazole (PRILOSEC) 40 MG capsule Take 1 capsule (40 mg total) by mouth 2 (two) times daily. 60 capsule 6  . promethazine (PHENERGAN) 25 MG  tablet Take 1 tablet (25 mg total) by mouth every 8 (eight) hours as needed for nausea or vomiting. 60 tablet 1  . midodrine (PROAMATINE) 5 MG tablet Take 1 tablet (5 mg total) by mouth 3 (three) times daily with meals. 90 tablet 1   No facility-administered medications prior to visit.     ROS Review of Systems  Constitutional: Positive for fatigue. Negative for activity change and appetite change.  HENT: Negative for congestion, sinus pressure and sore throat.   Eyes: Negative  for redness and visual disturbance.  Respiratory: Negative for cough, chest tightness, shortness of breath and wheezing.   Cardiovascular: Negative for chest pain and palpitations.  Gastrointestinal: Positive for nausea and vomiting. Negative for abdominal distention, abdominal pain and constipation.  Endocrine: Negative for polydipsia.  Genitourinary: Negative for dysuria and frequency.  Musculoskeletal: Negative for arthralgias and back pain.  Skin: Negative for rash.  Neurological: Negative for tremors, light-headedness and numbness.  Hematological: Does not bruise/bleed easily.  Psychiatric/Behavioral: Negative for agitation and behavioral problems.    Objective:  BP 135/83   Pulse 69   Ht 4\' 11"  (1.499 m)   Wt 136 lb (61.7 kg)   SpO2 97%   BMI 27.47 kg/m   BP/Weight 03/19/2020 09/25/6142 07/23/5398  Systolic BP 867 619 509  Diastolic BP 83 72 74  Wt. (Lbs) 136 142 142.4  BMI 27.47 28.68 28.76      Physical Exam Constitutional:      Appearance: She is well-developed.  Neck:     Vascular: No JVD.     Comments: Trach in place. Cardiovascular:     Rate and Rhythm: Normal rate.     Heart sounds: Normal heart sounds. No murmur heard.   Pulmonary:     Effort: Pulmonary effort is normal.     Breath sounds: Normal breath sounds. No wheezing or rales.  Chest:     Chest wall: No tenderness.  Abdominal:     General: Bowel sounds are normal. There is no distension.     Palpations: Abdomen is soft. There is no mass.     Tenderness: There is no abdominal tenderness.  Musculoskeletal:        General: Normal range of motion.     Right lower leg: No edema.     Left lower leg: No edema.  Neurological:     Mental Status: She is alert and oriented to person, place, and time.  Psychiatric:        Mood and Affect: Mood normal.     CMP Latest Ref Rng & Units 02/14/2020 01/04/2020 01/01/2020  Glucose 65 - 99 mg/dL 88 98 189(H)  BUN 8 - 27 mg/dL 6(L) 28(H) 20  Creatinine 0.57 -  1.00 mg/dL 1.00 1.00 0.95  Sodium 134 - 144 mmol/L 140 135 135  Potassium 3.5 - 5.2 mmol/L 4.6 3.9 4.3  Chloride 96 - 106 mmol/L 102 102 106  CO2 20 - 29 mmol/L 25 25 19(L)  Calcium 8.7 - 10.3 mg/dL 9.0 8.5(L) 8.3(L)  Total Protein 6.5 - 8.1 g/dL - - -  Total Bilirubin 0.3 - 1.2 mg/dL - - -  Alkaline Phos 38 - 126 U/L - - -  AST 15 - 41 U/L - - -  ALT 0 - 44 U/L - - -    Lipid Panel     Component Value Date/Time   CHOL 113 07/20/2019 0356   CHOL 137 02/26/2017 1035   TRIG 73 12/28/2019 0406   HDL 36 (  L) 07/20/2019 0356   HDL 35 (L) 02/26/2017 1035   CHOLHDL 3.1 07/20/2019 0356   VLDL 11 07/20/2019 0356   LDLCALC 66 07/20/2019 0356   LDLCALC 49 02/26/2017 1035   LDLDIRECT 78 02/20/2016 1221    CBC    Component Value Date/Time   WBC 5.8 02/14/2020 1336   WBC 13.9 (H) 01/04/2020 0108   RBC 4.15 02/14/2020 1336   RBC 3.79 (L) 01/04/2020 0108   HGB 12.7 02/14/2020 1336   HCT 39.6 02/14/2020 1336   PLT 324 02/14/2020 1336   MCV 95 02/14/2020 1336   MCH 30.6 02/14/2020 1336   MCH 29.0 01/04/2020 0108   MCHC 32.1 02/14/2020 1336   MCHC 32.5 01/04/2020 0108   RDW 15.2 02/14/2020 1336   LYMPHSABS 2.7 02/14/2020 1336   MONOABS 0.7 12/02/2019 1021   EOSABS 0.0 02/14/2020 1336   BASOSABS 0.1 02/14/2020 1336    Lab Results  Component Value Date   HGBA1C 7.8 (H) 01/03/2020    Assessment & Plan:  1. Type 2 diabetes mellitus with other circulatory complication, without long-term current use of insulin (HCC) Not fully optimized with A1c of 7.8; goal is less than 7.0 Newly diagnosed No regimen change A1c due at next visit Counseled on Diabetic diet, my plate method, 830 minutes of moderate intensity exercise/week Blood sugar logs with fasting goals of 80-120 mg/dl, random of less than 180 and in the event of sugars less than 60 mg/dl or greater than 400 mg/dl encouraged to notify the clinic. Advised on the need for annual eye exams, annual foot exams, Pneumonia vaccine. -  POCT glucose (manual entry)  2. Atrial fibrillation with RVR (HCC) Paroxysmal A. fib Currently on amiodarone and Eliquis Followed by EP  3. 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 Other specified hypothyroidism Controlled - T4, free - TSH  5. Other fatigue We will check thyroid panel and vitamin D Last CBC was normal If symptoms persist advised to reach out to her cardiologist  6. Encounter for vitamin deficiency screening - VITAMIN D 25 Hydroxy (Vit-D Deficiency, Fractures)   7 Nausea and vomiting Uncontrolled on PPI I will have the referral coordinator reach out to GI to follow-up on her referral.   No orders of the defined types were placed in this encounter.   Follow-up: Return in about 3 months (around 06/19/2020) for Chronic disease management.       Charlott Rakes, MD, FAAFP. [facility] [place], [place]   03/19/2020, 1:05 PM

## 2020-03-20 ENCOUNTER — Other Ambulatory Visit: Payer: Self-pay | Admitting: Family Medicine

## 2020-03-20 LAB — TSH: TSH: 0.113 u[IU]/mL — ABNORMAL LOW (ref 0.450–4.500)

## 2020-03-20 LAB — T4, FREE: Free T4: 2.41 ng/dL — ABNORMAL HIGH (ref 0.82–1.77)

## 2020-03-20 LAB — VITAMIN D 25 HYDROXY (VIT D DEFICIENCY, FRACTURES): Vit D, 25-Hydroxy: 14.7 ng/mL — ABNORMAL LOW (ref 30.0–100.0)

## 2020-03-20 MED ORDER — ERGOCALCIFEROL 1.25 MG (50000 UT) PO CAPS: 50000.0000 [IU] | ORAL_CAPSULE | ORAL | 0 refills | Status: DC

## 2020-03-20 MED ORDER — LEVOTHYROXINE SODIUM 75 MCG PO TABS
75.0000 ug | ORAL_TABLET | Freq: Every day | ORAL | 3 refills | Status: DC
Start: 2020-03-20 — End: 2020-07-18

## 2020-03-21 ENCOUNTER — Telehealth: Payer: Self-pay

## 2020-03-21 NOTE — Telephone Encounter (Signed)
Patient was called and a voicemail was left informing patient to return phone call for lab results. 

## 2020-03-21 NOTE — Telephone Encounter (Signed)
-----   Message from Charlott Rakes, MD sent at 03/20/2020  8:36 AM EDT ----- Please inform her her thyroid levels reveal she is now hyperthyroid and she has vitamin D deficiency that can explain her fatigue.  I have reduced her dose of levothyroxine and sent a prescription for Drisdol to her pharmacy for course of 12 weeks after which she would need a repeat level and would need to continue with OTC vitamin D.

## 2020-03-27 ENCOUNTER — Encounter: Payer: Self-pay | Admitting: Internal Medicine

## 2020-03-27 ENCOUNTER — Ambulatory Visit (INDEPENDENT_AMBULATORY_CARE_PROVIDER_SITE_OTHER): Payer: Medicare Other | Admitting: Internal Medicine

## 2020-03-27 ENCOUNTER — Other Ambulatory Visit: Payer: Self-pay | Admitting: Internal Medicine

## 2020-03-27 VITALS — BP 138/80 | HR 76 | Ht 58.5 in | Wt 135.1 lb

## 2020-03-27 DIAGNOSIS — R112 Nausea with vomiting, unspecified: Secondary | ICD-10-CM

## 2020-03-27 DIAGNOSIS — R935 Abnormal findings on diagnostic imaging of other abdominal regions, including retroperitoneum: Secondary | ICD-10-CM | POA: Diagnosis not present

## 2020-03-27 DIAGNOSIS — I639 Cerebral infarction, unspecified: Secondary | ICD-10-CM | POA: Diagnosis not present

## 2020-03-27 MED ORDER — ONDANSETRON HCL 4 MG PO TABS: 4.0000 mg | ORAL_TABLET | Freq: Three times a day (TID) | ORAL | 1 refills | Status: DC | PRN

## 2020-03-27 NOTE — Patient Instructions (Signed)
  Hemos enviado los siguientes medicamentos a su farmacia para que los recoja a su conveniencia: Zofran: tmelo tres veces al da segn sea necesario para las nuseas.  Se le ha programado una serie GI superior en [place] es el 01/03/2020 a las 11:00 am. Llegue 30 minutos antes de su examen para registrarse. Asegrese de no comer ni beber nada despus de la medianoche de la noche anterior a la prueba. Si necesita reprogramar, llame a radiologa al [phone]. ________________________________________________________________ Ardelia Mems serie GI superior Joylene Grapes X para ayudar a diagnosticar problemas del tracto GI superior, que incluye el esfago, el estmago y Agricultural consultant. El duodeno es la primera parte del intestino delgado. Ardelia Mems serie GI superior la realiza un tecnlogo en radiologa o un radilogo (un mdico que se especializa en imgenes de rayos X) en un hospital o centro ambulatorio. Mientras est sentado o de pie frente a una mquina de rayos X, el paciente bebe bario lquido, que a menudo es blanco y tiene una consistencia y un sabor a tiza. El bario lquido recubre el revestimiento del tracto gastrointestinal superior y hace que los signos de enfermedad se vean ms claramente en los rayos X. El video de rayos X, llamado fluoroscopia, se [place] para ver el lquido de bario que se mueve a travs del esfago, el [place] y Agricultural consultant. Se realizan radiografas y fluoroscopia adicionales mientras el paciente est acostado en una mesa de rayos X. Para cubrir completamente el tracto gastrointestinal superior con lquido de bario, el tecnlogo o radilogo puede presionar el abdomen o pedirle al paciente que cambie de posicin. Los pacientes permanecen quietos en varias posiciones, lo que permite al tecnlogo o radilogo tomar radiografas del tracto gastrointestinal superior en diferentes ngulos. Si un tecnlogo realiza la serie GI superior, un radilogo examinar posteriormente las imgenes  para buscar problemas.  Esta prueba suele tardar aproximadamente 1 hora en completarse.

## 2020-03-27 NOTE — Progress Notes (Signed)
HISTORY OF PRESENT ILLNESS:  Jamie Mitchell is a 74 y.o. female, non-English-speaking native of [place], with multiple medical problems including atrial fibrillation on amiodarone, history of stroke on Eliquis, diabetes mellitus recently diagnosed on Metformin, hypertension, hypothyroidism, and a history of acute hypoxic respiratory failure secondary to multifocal pneumonia for which she was hospitalized for 2 weeks December 21, 2019 through January 04, 2020. She underwent tracheostomy during that admission. She is sent today by her primary care provider regarding a 67-monthhistory of problems with nausea vomiting. Problems began after her hospital discharge. Her hemoglobin A1c was 7.8. Her Metformin was increased to 1000 mg twice daily. She is accompanied today by her non-English-speaking daughter-in-law who assists with history. A professional interpreter is present. She tells me that she may vomit 2 or 3 times per day. She may go a day without vomiting. Not always related to meals. There is no abdominal pain. She was placed on omeprazole and famotidine without improvement. Phenergan does not help. Review of blood work from February 14, 2020 reveals unremarkable basic metabolic panel and CBC. Hemoglobin 12.7. Last TSH 2.1. Review of x-ray file shows esophagram performed November 01, 2019. Evidence of reflux questionable esophageal motility disorder. Mild organoaxial rotation of the stomach in the upper abdomen noted.  REVIEW OF SYSTEMS:  All non-GI ROS negative as otherwise stated in the HPI except for cough, fatigue, heart rhythm change, shortness of breath, sore throat, voice change  Past Medical History:  Diagnosis Date  . Atrial fibrillation ([facility]   . 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 DM (diabetes mellitus) ([facility]   . HLD (hyperlipidemia)   . Hypertension   . Hypothyroidism   . Renal failure 07/25/2019  . Stroke ([facility]     Past Surgical History:  Procedure Laterality Date  . IR CT HEAD LTD   07/20/2019  . IR PERCUTANEOUS ART THROMBECTOMY/INFUSION INTRACRANIAL INC DIAG ANGIO  07/20/2019  . KNEE SURGERY Left   . RADIOLOGY WITH ANESTHESIA N/A 07/20/2019   Procedure: IR WITH ANESTHESIA;  Surgeon: DLuanne Bras MD;  [facility]  Service: Radiology;  Laterality: N/A;  . TRACHEOSTOMY TUBE PLACEMENT N/A 12/27/2019   Procedure: TRACHEOSTOMY;  Surgeon: RIzora Gala MD;  [facility] [facility]  Service: ENT;  Laterality: N/A;    Social History Jamie Mitchell  reports that she has never smoked. She has never used smokeless tobacco. She reports that she does not drink alcohol and does not use drugs.  family history includes Birth defects in her daughter; Heart disease in her sister.  No Known Allergies     PHYSICAL EXAMINATION: Vital signs: BP 138/80 (BP Location: Left Arm, Patient Position: Sitting, Cuff Size: Normal)   Pulse 76   Ht 4' 10.5" (1.486 m) Comment: height measured without shoes  Wt 135 lb 2 oz (61.3 kg)   BMI 27.76 kg/m   Constitutional: generally well-appearing, no acute distress Psychiatric: alert and oriented x3, cooperative Eyes: extraocular movements intact, anicteric, conjunctiva pink Mouth: oral pharynx moist, no lesions Neck: supple no lymphadenopathy. Tracheostomy in place Cardiovascular: heart irregular rate and rhythm, no murmur Lungs: clear to auscultation bilaterally Abdomen: soft, nontender, nondistended, no obvious ascites, no peritoneal signs, normal bowel sounds, no organomegaly Rectal: Omitted Extremities: no clubbing or cyanosis. Trace lower extremity edema bilaterally Skin: no lesions on visible extremities Neuro: No focal deficits. Cranial nerves intact  ASSESSMENT:  1. Problems with recurrent vomiting as described. Seems it started after her most recent hospital discharge. Metformin was increased at that time. Prior  esophagram suggesting possible gastric volvulus. I suspect that her symptoms are related to higher dose Metformin.  Rule out obstructive process, such as gastric volvulus. 2. Multiple significant medical problems   PLAN:  1. Upper GI series. Rule out obstructive process. Rule out ulcer disease. Rule out organoaxial volvulus of the stomach. 2. Prescribe Zofran 4 mg 3 times daily running 3. If no significant abnormality on upper GI series, then return to PCP to hold Metformin to see if this is the culprit. If so, other treatments for diabetes can be initiated. A total time of 65 minutes was required preparing to see the patient, reviewing a myriad of hospital records, test, x-rays, and surgical records. Obtaining comprehensive history with the assistance of a professional interpreter, performing comprehensive physical examination. Also, counseling the patient and her daughter-in-law regarding her above problems, ordering medications and advanced radiology tests, and documenting clinical information in the health record.

## 2020-03-28 ENCOUNTER — Ambulatory Visit: Payer: Self-pay | Admitting: *Deleted

## 2020-04-01 ENCOUNTER — Other Ambulatory Visit: Payer: Self-pay

## 2020-04-01 ENCOUNTER — Other Ambulatory Visit: Payer: Self-pay | Admitting: *Deleted

## 2020-04-01 ENCOUNTER — Ambulatory Visit: Payer: Medicare Other

## 2020-04-01 ENCOUNTER — Ambulatory Visit (HOSPITAL_COMMUNITY)
Admission: RE | Admit: 2020-04-01 | Discharge: 2020-04-01 | Disposition: A | Discharge status: Home / Self Care | Payer: Medicare Other | Source: Ambulatory Visit | Attending: Internal Medicine | Admitting: Internal Medicine

## 2020-04-01 DIAGNOSIS — K219 Gastro-esophageal reflux disease without esophagitis: Secondary | ICD-10-CM | POA: Diagnosis not present

## 2020-04-01 DIAGNOSIS — R112 Nausea with vomiting, unspecified: Secondary | ICD-10-CM | POA: Insufficient documentation

## 2020-04-01 IMAGING — RF DG UGI W/ HIGH DENSITY W/O KUB
10 of 13 series · 15 of 19 positions shown · non-contrast
Comparison: [DATE] esophagram

CLINICAL DATA: Vomiting.  Nausea.

EXAM:
UPPER GI SERIES WITH KUB
TECHNIQUE: After obtaining a scout radiograph a routine upper GI series was
performed using thin and thick barium
FLUOROSCOPY TIME:  Fluoroscopy Time:  2 minutes and 30 seconds
Radiation Exposure Index (if provided by the fluoroscopic device):
47.5 mGy
Number of Acquired Spot Images: 0

[Series 1: t abdomen supine · 0.15mm/px · 1 of 1 slices shown]
[im 1/1]
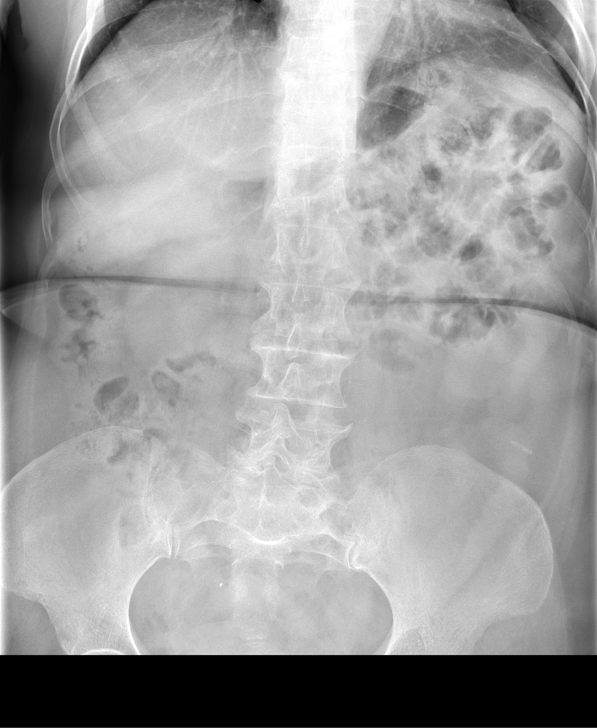

[Series 2: cp_standard · 0.51mm/px · 3 of 200 frames shown (1 of 9)]
[frame 31/200]
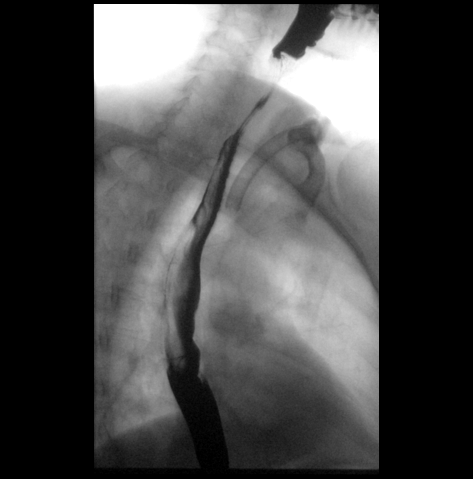
[frame 101/200]
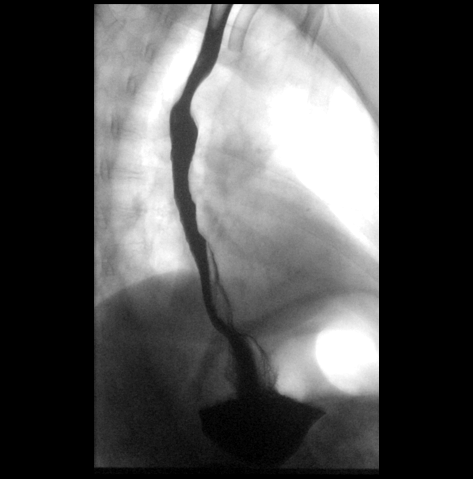
[frame 171/200]
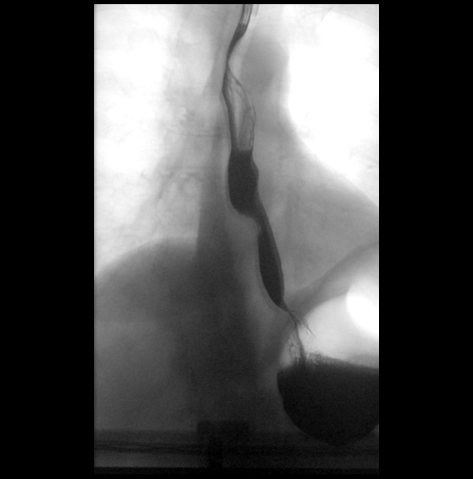

[Series 3: cp_standard · 0.25mm/px · 1 of 1 slices shown (2 of 9)]
[im 1/1]
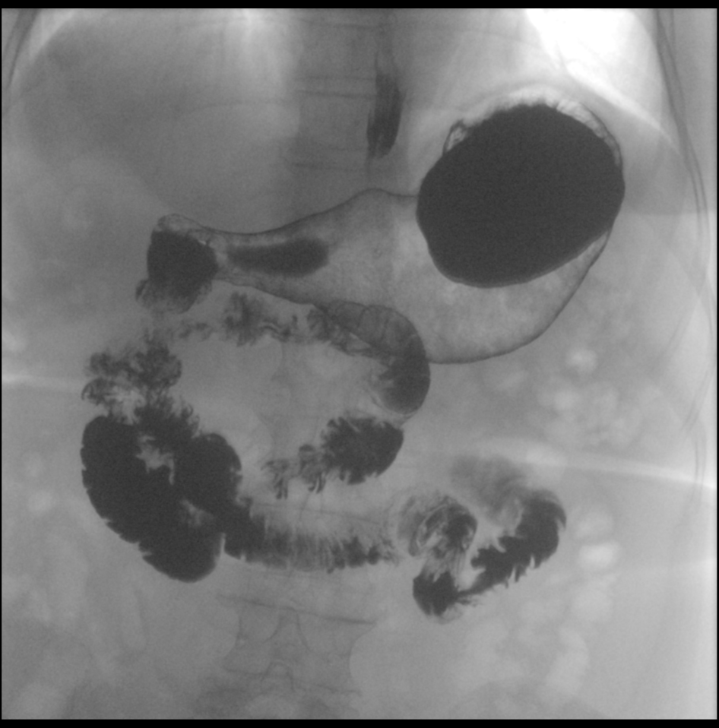

[Series 4: cp_standard · 0.25mm/px · 1 of 1 slices shown (3 of 9)]
[im 1/1]
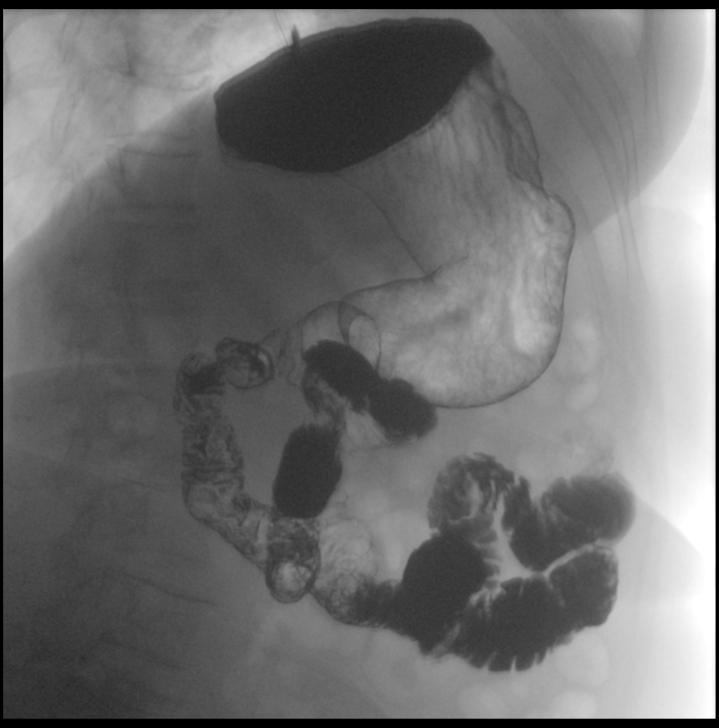

[Series 6: cp_standard · 0.25mm/px · 1 of 1 slices shown (4 of 9)]
[im 1/1]
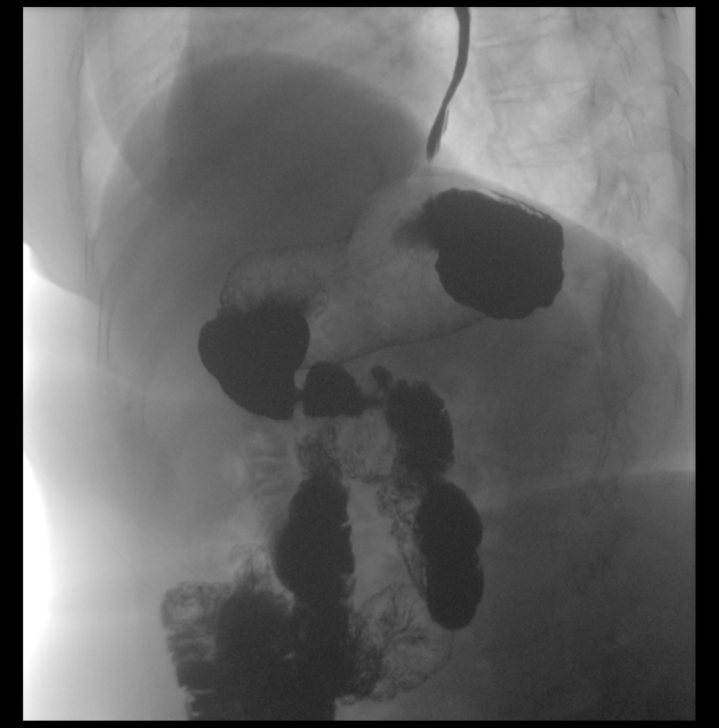

[Series 7: cp_standard · 0.26mm/px · 1 of 1 slices shown (5 of 9)]
[im 1/1]
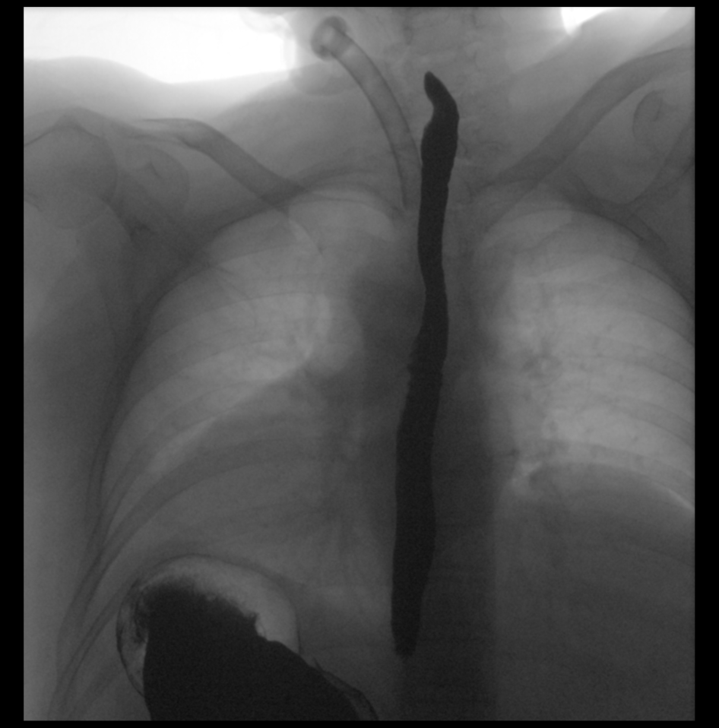

[Series 8: cp_standard · 0.27mm/px · 1 of 1 slices shown (6 of 9)]
[im 1/1]
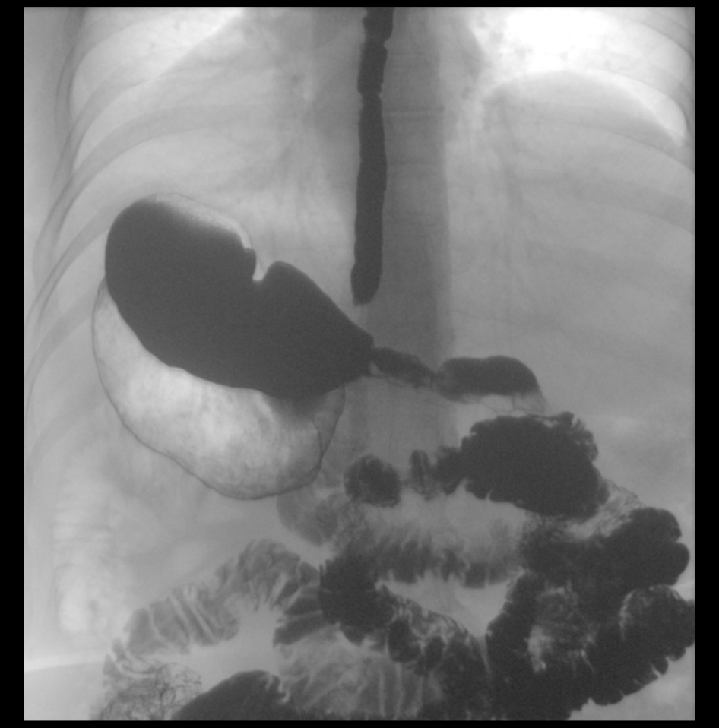

[Series 10: cp_standard · 0.36mm/px · 4 of 173 frames shown (7 of 9)]
[frame 26/173]
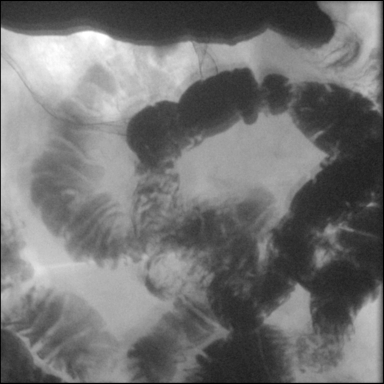
[frame 87/173]
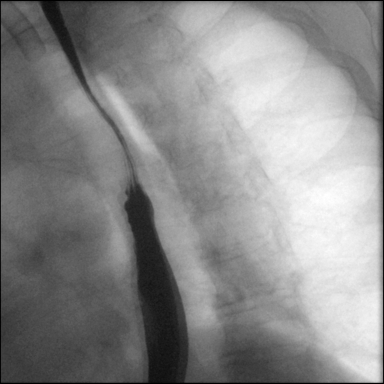
[frame 148/173]
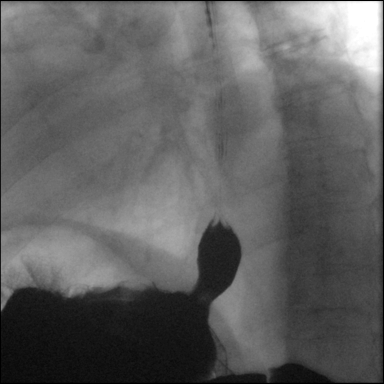
[frame 165/173]
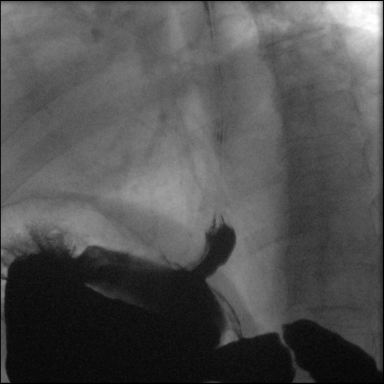

[Series 12: cp_standard · 0.18mm/px · 1 of 1 slices shown (8 of 9)]
[im 1/1]
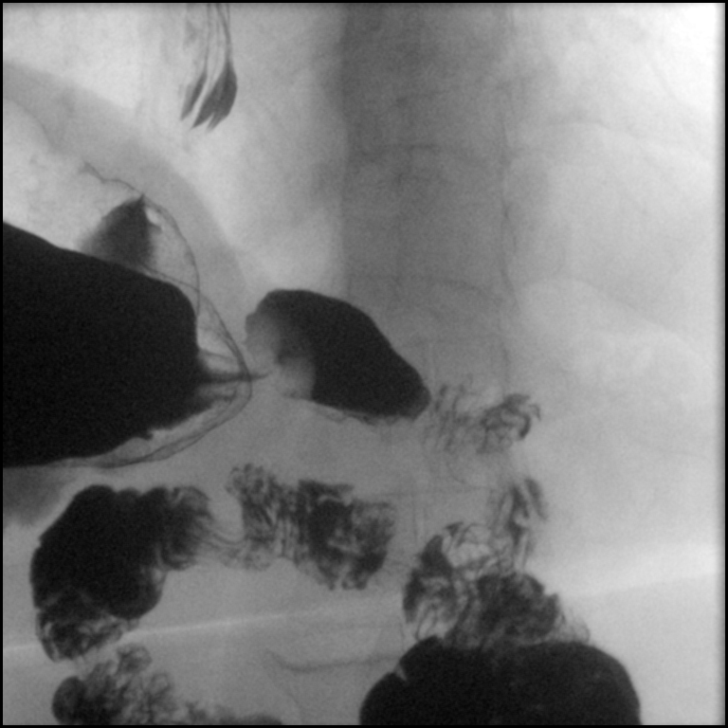

[Series 13: cp_standard · 0.27mm/px · 1 of 1 slices shown (9 of 9)]
[im 1/1]
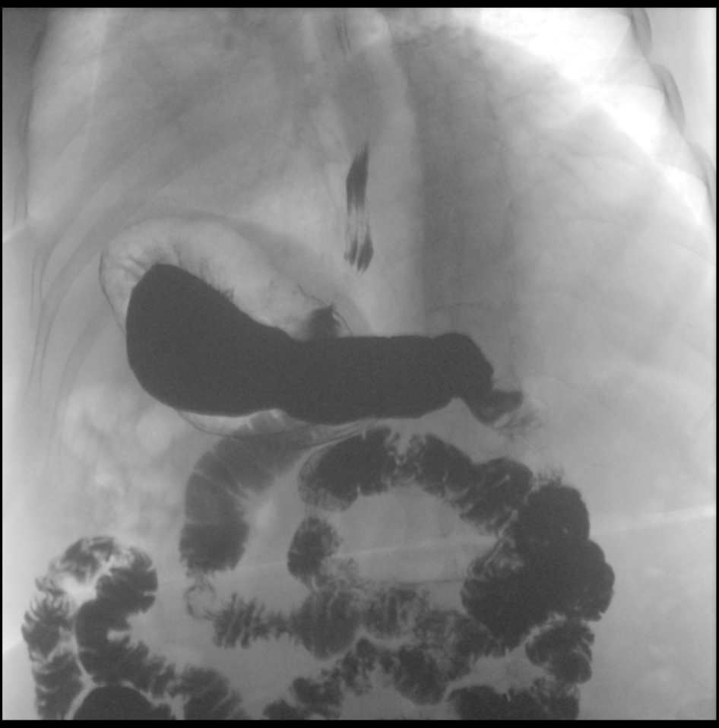

[15 of 19 positions shown; findings below may reference images not displayed]

FINDINGS: Expected difficulty secondary to language barrier. A translator was
used.

Preprocedure scout film is unremarkable. Double contrast evaluation
of the esophagus is normal.

Double contrast evaluation of the stomach demonstrates no mass,
ulcer. Spontaneous gastroesophageal reflux to the level of the upper
thoracic esophagus. Normal duodenal bulb and C-loop.
IMPRESSION: 1. Significant spontaneous gastroesophageal reflux to the level of
the upper thoracic esophagus.
2. Normal appearance of the stomach and imaged duodenum.

## 2020-04-01 MED ORDER — PANTOPRAZOLE SODIUM 40 MG PO TBEC: 40.0000 mg | DELAYED_RELEASE_TABLET | Freq: Two times a day (BID) | ORAL | 6 refills | Status: DC

## 2020-04-01 NOTE — Patient Outreach (Signed)
[facility]) Care Management  04/01/2020  Rachyl Wuebker Aug 22, 1945 [id number]   Telephone outreach to Mr. [name], Mrs. Zella Richer son to follow up from prior [place].  Unsuccessful, left message and requested a return call.  [name]. [name], MSN, [facility] Gerontological Nurse Practitioner [facility] Care Management [phone]

## 2020-04-02 ENCOUNTER — Other Ambulatory Visit: Payer: Self-pay | Admitting: *Deleted

## 2020-04-02 NOTE — Patient Outreach (Signed)
[facility]) Care Management  04/02/2020  Jamie Mitchell Dec 06, 1945 [id number]  Telephone outreach to pt's son, 2nd attempt.  Spoke to [name]. Introduced myself as new Transport planner taking over for [employer].  Mr. [name] says his mother is doing a little better. She has been advised to stop her metformin as this seems to be the factor that started her sxs of nausea, vomiting. She is to monitor her glucose daily per son and will have a follow up in a couple of weeks.  Requested a time that the 3 of [place] could speak together and Mr. [name] feels NP Spanish is good to have an initial conversation and introduction. He suggests I call tomorrow and talk with his sister who stays with his mother.  Scheduled a call for 3:30 tomorrow.  [name]. [name], MSN, [facility] Gerontological Nurse Practitioner [facility] Care Management [phone]

## 2020-04-03 ENCOUNTER — Other Ambulatory Visit: Payer: Self-pay | Admitting: *Deleted

## 2020-04-03 ENCOUNTER — Other Ambulatory Visit: Payer: Self-pay

## 2020-04-03 NOTE — Patient Outreach (Signed)
[name]) Care Management  04/03/2020  Jamie Mitchell Oct 09, 1945 [id number]  Telephone call to pt's residence. Talked with her daughter in law in pt presence in [place]. She verified that she understood me well. This is a follow up call to prior care manager's involvement.  Jamie Mitchell, informs me that her mother-in-law is improving. She does not have near as much nausea or vomiting. She is able to eat regular meals. Her wt has stablized. Her fasting glucose levels are <120, NFBS are <160. Last HgbA1C was 7.8 which was up above her <7.0 previous levels. She does get an appropriate diabetic diet prepared by her daughter-in-law.  BPs are in the 130's/70's in general. She is not having any acute respiratory sxs. She does get somewhat SOB in conversation, not being able to complete a whole sentence. This is not unusual. She has oxygen and wears it prn. They do monitor her pulse oxygen and it is usually 90-95%. Pt is able to perform ADLs and needs assistance with IADLs. She seems to be content and much happier than her immediate post CVA status.  Jamie Mitchell and myself agreed there is not need for continued care management, however, encouraged her to keep my number and NP is available for questions or problems in the future.  Jamie Pont. [name], MSN, [facility] Gerontological Nurse Practitioner [facility] Care Management [phone]

## 2020-04-04 DIAGNOSIS — J3802 Paralysis of vocal cords and larynx, bilateral: Secondary | ICD-10-CM | POA: Diagnosis not present

## 2020-04-04 DIAGNOSIS — R490 Dysphonia: Secondary | ICD-10-CM | POA: Diagnosis not present

## 2020-04-05 ENCOUNTER — Ambulatory Visit: Payer: Self-pay | Admitting: *Deleted

## 2020-04-13 DIAGNOSIS — E119 Type 2 diabetes mellitus without complications: Secondary | ICD-10-CM | POA: Diagnosis not present

## 2020-04-13 DIAGNOSIS — H40033 Anatomical narrow angle, bilateral: Secondary | ICD-10-CM | POA: Diagnosis not present

## 2020-04-17 ENCOUNTER — Telehealth: Payer: Self-pay | Admitting: Family Medicine

## 2020-04-17 NOTE — Telephone Encounter (Signed)
Jamie Mitchell was called and informed to continue OTC VIT de pills for pt.

## 2020-04-17 NOTE — Telephone Encounter (Signed)
Copied from [place] [id number]. Topic: General - Other >> Apr 17, 2020  1:36 PM Keene Breath wrote: Reason for CRM: Patient's daughter-n-law Rosemarie Ax) called to ask the nurse to call regarding the new medication the patient is taking.  She stated that the doctor wanted to know how the medication was affecting the patient.  CB# [phone]

## 2020-04-29 ENCOUNTER — Ambulatory Visit: Payer: Medicare Other | Admitting: Cardiology

## 2020-05-30 DIAGNOSIS — J386 Stenosis of larynx: Secondary | ICD-10-CM | POA: Diagnosis not present

## 2020-05-30 DIAGNOSIS — J383 Other diseases of vocal cords: Secondary | ICD-10-CM | POA: Diagnosis not present

## 2020-05-30 DIAGNOSIS — Z930 Tracheostomy status: Secondary | ICD-10-CM | POA: Diagnosis not present

## 2020-06-05 ENCOUNTER — Telehealth (INDEPENDENT_AMBULATORY_CARE_PROVIDER_SITE_OTHER): Payer: Self-pay

## 2020-06-05 NOTE — Telephone Encounter (Signed)
Copied from [place] [id number]. Topic: General - Other >> Jun 04, 2020  3:18 PM Hinda Lenis D wrote: PT daughter requesting to speak with a nurse / fatigue / please advise

## 2020-06-06 NOTE — Telephone Encounter (Signed)
Please refer pt to mobile unit or set appointment with any available provider.

## 2020-06-07 NOTE — Telephone Encounter (Signed)
I have tried to contact the patient but was not successful. I left a vm asking patient to call back for an appt or to go to UC.

## 2020-06-17 ENCOUNTER — Telehealth: Payer: Self-pay | Admitting: Family Medicine

## 2020-06-18 ENCOUNTER — Encounter: Payer: Self-pay | Admitting: Physician Assistant

## 2020-06-18 ENCOUNTER — Ambulatory Visit (INDEPENDENT_AMBULATORY_CARE_PROVIDER_SITE_OTHER): Payer: Medicare Other | Admitting: Physician Assistant

## 2020-06-18 ENCOUNTER — Other Ambulatory Visit: Payer: Self-pay

## 2020-06-18 VITALS — BP 132/75 | HR 60 | Temp 98.7°F | Resp 18 | Ht 62.0 in

## 2020-06-18 DIAGNOSIS — K224 Dyskinesia of esophagus: Secondary | ICD-10-CM | POA: Diagnosis not present

## 2020-06-18 DIAGNOSIS — K219 Gastro-esophageal reflux disease without esophagitis: Secondary | ICD-10-CM | POA: Diagnosis not present

## 2020-06-18 DIAGNOSIS — E559 Vitamin D deficiency, unspecified: Secondary | ICD-10-CM | POA: Diagnosis not present

## 2020-06-18 DIAGNOSIS — I63512 Cerebral infarction due to unspecified occlusion or stenosis of left middle cerebral artery: Secondary | ICD-10-CM | POA: Diagnosis not present

## 2020-06-18 DIAGNOSIS — R5383 Other fatigue: Secondary | ICD-10-CM

## 2020-06-18 DIAGNOSIS — E038 Other specified hypothyroidism: Secondary | ICD-10-CM

## 2020-06-18 NOTE — Telephone Encounter (Signed)
Medical Assistant used [employer] to contact patient.  Interpreter Name:Oscar Interpreter #: [phone] Patient has taken medication today and patient has eaten today. Patient complains of feeling increased fatigue over the past 3 days which is a condition she has been experiencing for "sometime now" Patient states she is able to eat with intermittent vomiting after eating. Patient is unable to dictate which foods causes it.

## 2020-06-18 NOTE — Patient Instructions (Signed)
Fatiga Fatigue Si siente fatiga, tiene una sensacin de cansancio en todo momento y falta de energa o falta de motivacin. La fatiga puede dificultar el inicio o la realizacin de tareas debido al agotamiento. Por lo general, la fatiga ocasional o leve con frecuencia es una reaccin normal a la actividad o la vida. Sin embargo, la fatiga de larga duracin (crnica) o extrema puede ser sntoma de una enfermedad. Siga estas indicaciones en su casa: Instrucciones generales  Controle su fatiga para ver si hay cambios.  Acustese y levntese a la misma hora todos los das.  Evite la fatiga moderando su ritmo durante el da y durmiendo lo suficiente durante la noche.  Mantenga un peso saludable. Medicamentos  Tome los medicamentos de venta libre y los recetados solamente como se lo haya indicado el mdico.  Tome una multivitamina, si se lo indic el mdico.  No tome ningn complemento alimenticio o a base de hierbas a menos que lo haya autorizado el mdico. Actividad  Realice ejercicio con regularidad como se lo haya indicado el mdico.  Practique tcnicas que lo ayuden a relajarse, como yoga, tai chi, meditacin, terapia de masajes.   Comida y bebida  Evite las comidas abundantes a la noche.  Consuma una dieta bien balanceada que incluya protenas magras, cereales integrales, abundantes frutas, verduras y productos lcteos descremados.  Evite el consumo excesivo de cafena.  Evite el consumo de alcohol.  Beba suficiente lquido para mantener la orina de color amarillo plido.   Estilo de vida  Cambie las situaciones que le provocan estrs. Trate de que su horario de trabajo y personal estn equilibrados.  No consuma ningn producto que contenga nicotina o tabaco, como cigarrillos y cigarrillos electrnicos. Si necesita ayuda para dejar de fumar, consulte al mdico.  No consuma drogas. Comunquese con un mdico si:  La fatiga no mejora.  Tiene fiebre.  Pierde peso o  aumenta de peso de forma repentina.  Tiene dolores de cabeza.  Tiene problemas para conciliar el sueo o para dormir en la noche.  Se siente enojado, culpable, ansioso o triste.  No puede defecar (estreimiento).  Tiene la piel seca.  Observa hinchazn en las piernas u otras partes del cuerpo. Solicite ayuda de inmediato si:  Se siente confundido.  Tiene visin borrosa.  Tiene mareos o se desmaya.  Tiene un dolor de cabeza intenso.  Siente dolor intenso en el abdomen, la espalda o el rea entre la cintura y las caderas (pelvis).  Tiene dolor de pecho, dificultad para respirar, o latidos cardacos irregulares o acelerados.  No puede orinar u orina menos de lo normal.  Presenta sangrado anormal, como sangrado del recto, la vagina, la nariz, los pulmones o los pezones.  Vomita sangre.  Tiene pensamientos acerca de lastimarse a usted mismo o a otras personas. Si alguna vez siente que puede lastimarse o lastimar a los dems, o tiene pensamientos de poner fin a su vida, busque ayuda de inmediato. Puede dirigirse al servicio de emergencias ms cercano o comunicarse con:  El servicio de emergencias local (911 en los [place]).  Una lnea de asistencia al suicida y atencin en crisis, como la Lnea Nacional de Prevencin del Suicidio (National Suicide Prevention Lifeline) al 1-800-273-8255. Est disponible las 24 horas del da. Resumen  Si siente fatiga, tiene una sensacin de cansancio en todo momento y falta de energa o falta de motivacin.  La fatiga puede dificultar el inicio o la realizacin de tareas debido al agotamiento.  La fatiga de   larga duracin (crnica) o extrema puede ser sntoma de una enfermedad.  Realice ejercicio con regularidad como se lo haya indicado el mdico.  Cambie las situaciones que le provocan estrs. Trate de que su horario de trabajo y personal estn equilibrados. Esta informacin no tiene como fin reemplazar el consejo del mdico.  Asegrese de hacerle al mdico cualquier pregunta que tenga. Document Revised: 04/28/2017 Document Reviewed: 04/28/2017 Elsevier Patient Education  2021 Elsevier Inc.  

## 2020-06-18 NOTE — Progress Notes (Signed)
Established Patient Office Visit  Subjective:  Patient ID: Jamie Mitchell, female    DOB: February 29, 1946  Age: 75 y.o. 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 Also found to have hypothyroidism and commenced on Levothyroxine.  She presents today with relative who does help with history.  Due to language barrier, an interpreter was present during the history-taking and subsequent discussion (and for part of the physical exam) with this patient.  States that she has not had follow-up with gastroenterology, continues to have episodes of vomiting after eating, states this happens several times a week.  Reports that she has been having fatigue, despite adequate rest.  This has been a chronic complaint.  Reports that she has been taking the vitamin D on a weekly basis, states that she was only given 1 refill, states that she took it for 2 months but has been out the past month.   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   DM (diabetes mellitus) (HCC)    HLD (hyperlipidemia)    Hypertension    Hypothyroidism    Renal failure 07/25/2019   Stroke [facility])     Past Surgical History:  Procedure Laterality Date   IR CT HEAD LTD  07/20/2019   IR PERCUTANEOUS ART THROMBECTOMY/INFUSION INTRACRANIAL INC DIAG ANGIO  07/20/2019   KNEE SURGERY Left    RADIOLOGY WITH ANESTHESIA N/A 07/20/2019   Procedure: IR WITH ANESTHESIA;  Surgeon: Julieanne Cotton, MD;  Location: [facility] OR;  Service: Radiology;   Laterality: N/A;   TRACHEOSTOMY TUBE PLACEMENT N/A 12/27/2019   Procedure: TRACHEOSTOMY;  Surgeon: Serena Colonel, MD;  Location: [facility] OR;  Service: ENT;  Laterality: N/A;    Family History  Problem Relation Age of Onset   Birth defects Daughter        feet   Heart disease Sister     Social History   Socioeconomic History   Marital status: Married    Spouse name: Not on file   Number of children: 11   Years of education: Not on file   Highest education level: Not on file  Occupational History   Not on file  Tobacco Use   Smoking status: Never Smoker   Smokeless tobacco: Never Used  Vaping Use   Vaping Use: Never used  Substance and Sexual Activity   Alcohol use: No   Drug use: No   Sexual activity: Not Currently    Birth control/protection: None    Comment: Married  Other Topics Concern   Not on file  Social History Narrative   Lives with son Elita Quick)    Social Determinants of Health   Financial Resource Strain: Not on file  Food Insecurity: Not on file  Transportation Needs: No Transportation Needs   Lack of Transportation (Medical): No   Lack of Transportation (Non-Medical): No  Physical Activity: Not on file  Stress: Not on file  Social Connections: Not on file  Intimate Partner Violence: Not on file    Outpatient Medications Prior to Visit  Medication Sig Dispense Refill   amiodarone (PACERONE) 200 MG tablet Take  1 tablet (200 mg total) by mouth daily. 90 tablet 1   atorvastatin (LIPITOR) 40 MG tablet Take 1 tablet (40 mg total) by mouth every evening. 90 tablet 3   diclofenac Sodium (VOLTAREN) 1 % GEL Apply 2 g topically 4 (four) times daily as needed (shoulder pain). 50 g 4   ELIQUIS 5 MG TABS tablet Take 1 tablet (5 mg total) by mouth 2 (two) times daily. 60 tablet 6   EPINEPHrine 0.3 mg/0.3 mL IJ SOAJ injection Inject 0.3 mLs (0.3 mg total) into the muscle as needed for anaphylaxis. 1 each 1   ergocalciferol (DRISDOL) 1.25 MG (50000 UT)  capsule Take 1 capsule (50,000 Units total) by mouth once a week. 12 capsule 0   ipratropium-albuterol (DUONEB) 0.5-2.5 (3) MG/3ML SOLN Take 3 mLs by nebulization every 6 (six) hours as needed. 360 mL 2   levothyroxine (SYNTHROID) 75 MCG tablet Take 1 tablet (75 mcg total) by mouth daily before breakfast. 30 tablet 3   metFORMIN (GLUCOPHAGE) 500 MG tablet Take 2 tablets (1,000 mg total) by mouth 2 (two) times daily with a meal. 180 tablet 1   ondansetron (ZOFRAN) 4 MG tablet Take 1 tablet (4 mg total) by mouth 3 (three) times daily as needed for nausea or vomiting. 90 tablet 1   pantoprazole (PROTONIX) 40 MG tablet Take 1 tablet (40 mg total) by mouth 2 (two) times daily before a meal. 60 tablet 6   promethazine (PHENERGAN) 25 MG tablet Take 1 tablet (25 mg total) by mouth every 8 (eight) hours as needed for nausea or vomiting. 60 tablet 1   No facility-administered medications prior to visit.    No Known Allergies  ROS Review of Systems  Constitutional: Positive for fatigue. Negative for chills and fever.  HENT: Negative for congestion.   Eyes: Negative.   Respiratory: Negative for cough and shortness of breath.   Cardiovascular: Negative for chest pain.  Gastrointestinal: Positive for abdominal pain and vomiting.  Endocrine: Negative.   Genitourinary: Negative.   Musculoskeletal: Negative.   Skin: Negative.   Allergic/Immunologic: Negative.   Neurological: Negative.   Hematological: Negative.   Psychiatric/Behavioral: Negative.       Objective:    Physical Exam Vitals and nursing note reviewed.  Constitutional:      General: She is not in acute distress.    Appearance: Normal appearance. She is not ill-appearing.  HENT:     Head: Normocephalic and atraumatic.     Right Ear: External ear normal.     Left Ear: External ear normal.     Nose: Nose normal.     Mouth/Throat:     Mouth: Mucous membranes are moist.     Pharynx: Oropharynx is clear.  Eyes:      Extraocular Movements: Extraocular movements intact.     Conjunctiva/sclera: Conjunctivae normal.     Pupils: Pupils are equal, round, and reactive to light.  Neck:     Comments: Lurline Idol present Cardiovascular:     Rate and Rhythm: Normal rate and regular rhythm.     Pulses: Normal pulses.     Heart sounds: Normal heart sounds.  Pulmonary:     Effort: Pulmonary effort is normal.     Breath sounds: Normal breath sounds.  Abdominal:     General: Abdomen is flat.     Palpations: Abdomen is soft.     Tenderness: There is no abdominal tenderness.  Musculoskeletal:        General: Normal range of motion.  Skin:  General: Skin is warm and dry.  Neurological:     General: No focal deficit present.     Mental Status: She is alert.  Psychiatric:        Mood and Affect: Mood normal.        Behavior: Behavior normal.        Thought Content: Thought content normal.        Judgment: Judgment normal.     BP 132/75 (BP Location: Left Arm, Patient Position: Sitting, Cuff Size: Normal)    Pulse 60    Temp 98.7 F (37.1 C) (Oral)    Resp 18    Ht 5\' 2"  (1.575 m)    SpO2 97%    BMI 24.71 kg/m  Wt Readings from Last 3 Encounters:  03/27/20 135 lb 2 oz (61.3 kg)  03/19/20 136 lb (61.7 kg)  02/01/20 142 lb (64.4 kg)     Health Maintenance Due  Topic Date Due   OPHTHALMOLOGY EXAM  Never done   MAMMOGRAM  Never done   DEXA SCAN  Never done   COVID-19 Vaccine (3 - Booster for Moderna series) 04/20/2020    There are no preventive care reminders to display for this patient.  Lab Results  Component Value Date   TSH 0.113 (L) 03/19/2020   Lab Results  Component Value Date   WBC 5.8 02/14/2020   HGB 12.7 02/14/2020   HCT 39.6 02/14/2020   MCV 95 02/14/2020   PLT 324 02/14/2020   Lab Results  Component Value Date   NA 140 02/14/2020   K 4.6 02/14/2020   CO2 25 02/14/2020   GLUCOSE 88 02/14/2020   BUN 6 (L) 02/14/2020   CREATININE 1.00 02/14/2020   BILITOT 0.7 12/21/2019    ALKPHOS 49 12/21/2019   AST 34 12/21/2019   ALT 49 (H) 12/21/2019   PROT 6.0 (L) 12/21/2019   ALBUMIN 2.7 (L) 01/04/2020   CALCIUM 9.0 02/14/2020   ANIONGAP 8 01/04/2020   Lab Results  Component Value Date   CHOL 113 07/20/2019   Lab Results  Component Value Date   HDL 36 (L) 07/20/2019   Lab Results  Component Value Date   LDLCALC 66 07/20/2019   Lab Results  Component Value Date   TRIG 73 12/28/2019   Lab Results  Component Value Date   CHOLHDL 3.1 07/20/2019   Lab Results  Component Value Date   HGBA1C 7.8 (H) 01/03/2020      Assessment & Plan:   Problem List Items Addressed This Visit      Cardiovascular and Mediastinum   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 Esophageal dysmotility Assessment and plan from May 30, 2020 office visit with otolaryngology Assessment:  75 y.o. female with h/o DM, HTN, Afib, hypothyroidism, and Left MCA embolic stroke (63 s/p tPA and thrombectomy on Eliquis, intubated 4 days) who has developed progressively worsening stridor and shortness of breath since 09/2019. She is s/p SMDL, CRE balloon dilation, steroid injection into subglottic stenosis  on 12/11/19. She did well until 12/21/19 when she yet again had increased work of breathing requiring admission to [facility] and ultimately intubation for acute hypoxic respiratory failure now s/p tracheostomy with Dr. Constance Holster on 12/27/19. She was discharged with a 4 CFS and PMV. She has had no further breathing obstruction since discharge, but continued to have dyspnea with exertion. Her cardiologist has recommended  a pacemaker. Her understanding of this is that she cannot get the pacemaker while having a trach. After conversation in October with Marni Griffon with the trach clinic at [facility], we do not believe she will be a safe decannulation anytime soon. I believe this is still the case as she does not have normal abduction of her vocal cords and does not tolerate trach plugging. I do not believe decannulation is likely in the near future, thus if it is medically appropriate and having a pacemaker can be performed safely in the setting of a trach, it would make sense to not let tracheostomy delay this treatment. Fortunately, the patient has recently had improved energy since starting breathing exercises and vitamin D replacement therapy.   TFL today does not show improvement. She continues to have limited abduction of her vocal cords. It remains difficult to get her to get a view of the subglottis. She does not tolerate finger plugging at this time. She is continuing the breathing exercises provided by SLP. She said her last trach change was in October and so she is due for a routine trach change at [facility] clinic by the end of the month.     Plan:   - Return in 3 months for repeat evaluation - Continue breathing exercises as provided by Voice Therapist.  - Continue routine trach care with Marni Griffon at the [facility] with [facility] - Is being followed by Cardiology     2. Gastroesophageal reflux disease, unspecified whether esophagitis present Patient given appointment to follow-up at gastroenterology, upper GI series was completed, however no follow-up was completed   EXAM: UPPER GI SERIES WITH KUB  TECHNIQUE: After obtaining a scout radiograph a routine upper GI series was performed using thin and thick barium  FLUOROSCOPY TIME:  Fluoroscopy Time:  2 minutes and 30 seconds  Radiation Exposure Index (if provided by the fluoroscopic device): 47.5 mGy  Number of Acquired  Spot Images: 0  COMPARISON:  11/01/2019 esophagram  FINDINGS: Expected difficulty secondary to language barrier. A translator was used.  Preprocedure scout film is unremarkable. Double contrast evaluation of the esophagus is normal.  Double contrast evaluation of the stomach demonstrates no mass, ulcer. Spontaneous gastroesophageal reflux to the level of the upper thoracic esophagus. Normal duodenal bulb and C-loop.  IMPRESSION: 1. Significant spontaneous gastroesophageal reflux to the level of the upper thoracic esophagus. 2. Normal appearance of the stomach and imaged duodenum.   Electronically Signed   By: Abigail Miyamoto M.D.   On: 04/01/2020 13:49   3. 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 Other specified hypothyroidism  - Thyroid Panel With TSH  5. Other fatigue  - Comprehensive metabolic panel  6. Vitamin D deficiency  - Vitamin D 1,25 dihydroxy  No orders of the defined types were placed in this encounter. Appointment in place for her follow-up with gastroenterology and with  I have reviewed the patient's medical history (PMH, PSH, Social History, Family History, Medications, and allergies) , and have been updated if relevant. I spent 30 minutes  reviewing chart and  face to face time with patient.     Follow-up: No follow-ups on file.    Loraine Grip Mayers, PA-C

## 2020-06-18 NOTE — Progress Notes (Unsigned)
Medical Assistant used [employer] to contact patient.  Interpreter Name:Oscar Interpreter #: [id number] Patient has taken medication today and patient has eaten today. Patient complains of feeling increased fatigue over the past 3 days which is a condition she has been experiencing for "sometime now" Patient states she is able to eat with intermittent vomiting after eating. Patient is unable to dictate which foods causes it.  

## 2020-06-19 DIAGNOSIS — R5383 Other fatigue: Secondary | ICD-10-CM | POA: Insufficient documentation

## 2020-06-19 DIAGNOSIS — E559 Vitamin D deficiency, unspecified: Secondary | ICD-10-CM | POA: Insufficient documentation

## 2020-06-22 LAB — COMPREHENSIVE METABOLIC PANEL
ALT: 21 IU/L (ref 0–32)
AST: 18 IU/L (ref 0–40)
Albumin/Globulin Ratio: 1.6 (ref 1.2–2.2)
Albumin: 4.1 g/dL (ref 3.7–4.7)
Alkaline Phosphatase: 87 IU/L (ref 44–121)
BUN/Creatinine Ratio: 8 — ABNORMAL LOW (ref 12–28)
BUN: 9 mg/dL (ref 8–27)
Bilirubin Total: 0.6 mg/dL (ref 0.0–1.2)
CO2: 23 mmol/L (ref 20–29)
Calcium: 9 mg/dL (ref 8.7–10.3)
Chloride: 100 mmol/L (ref 96–106)
Creatinine, Ser: 1.2 mg/dL — ABNORMAL HIGH (ref 0.57–1.00)
GFR calc Af Amer: 52 mL/min/{1.73_m2} — ABNORMAL LOW (ref >59)
GFR calc non Af Amer: 45 mL/min/{1.73_m2} — ABNORMAL LOW (ref >59)
Globulin, Total: 2.6 g/dL (ref 1.5–4.5)
Glucose: 124 mg/dL — ABNORMAL HIGH (ref 65–99)
Potassium: 4.7 mmol/L (ref 3.5–5.2)
Sodium: 140 mmol/L (ref 134–144)
Total Protein: 6.7 g/dL (ref 6.0–8.5)

## 2020-06-22 LAB — VITAMIN D 1,25 DIHYDROXY
Vitamin D 1, 25 (OH)2 Total: 28 pg/mL
Vitamin D2 1, 25 (OH)2: 18 pg/mL
Vitamin D3 1, 25 (OH)2: 10 pg/mL

## 2020-06-22 LAB — THYROID PANEL WITH TSH
Free Thyroxine Index: 5.3 — ABNORMAL HIGH (ref 1.2–4.9)
T3 Uptake Ratio: 42 % — ABNORMAL HIGH (ref 24–39)
T4, Total: 12.5 ug/dL — ABNORMAL HIGH (ref 4.5–12.0)
TSH: 0.59 u[IU]/mL (ref 0.450–4.500)

## 2020-06-24 ENCOUNTER — Telehealth: Payer: Self-pay | Admitting: Family Medicine

## 2020-06-24 NOTE — Telephone Encounter (Signed)
Called pt and given phone nr, unable to reach , left VM

## 2020-06-24 NOTE — Telephone Encounter (Signed)
Patient's caregiver called to ask for the results of patients test results.  Please call Dorann Ou at [phone]

## 2020-06-25 ENCOUNTER — Telehealth: Payer: Self-pay | Admitting: Family Medicine

## 2020-06-25 ENCOUNTER — Telehealth: Payer: Self-pay | Admitting: *Deleted

## 2020-06-25 ENCOUNTER — Ambulatory Visit: Payer: Self-pay | Admitting: *Deleted

## 2020-06-25 NOTE — Telephone Encounter (Signed)
Called patient using interpreter services and LVM letting her know to call back to schedule virtual appointment.

## 2020-06-25 NOTE — Telephone Encounter (Signed)
Carilyn Goodpasture, this patient needs to be scheduled for a virtual visit with anyone available including the mobile unit. Thanks

## 2020-06-25 NOTE — Telephone Encounter (Signed)
I called and spoke with son [name].   At his request I called back using Spanish interpreter.from [employer].  He had called in wanting to know if there was any medication his mother could take for covid so I returned his call.  He is calling regarding his mother.   Yesterday test was positive for covid 19.    She is high risk and I was wondering if there is any medication she can take.  She has a trach, had a stroke, has diabetes.  She is not having symptoms of covid.    I let him know about the monoclonal antibody infusion for high risk people.   I let him know I would pass this information on to Dr. Margarita Rana to see if your mother would be a candidate.   If so the infusion clinic has to have an order from the doctor He was agreeable to having someone call him back regarding the infusion. [phone]  using a Spanish interpreter is best way to contact [name], son.  I sent my notes high priority to [facility] for Dr. Charlott Rakes.     Reason for Disposition . HIGH RISK for severe COVID complications (e.g., age > 59 years, obesity with BMI > 25, pregnant, chronic lung disease or other chronic medical condition)  (Exception: Already seen by PCP and no new or worsening symptoms.)  Answer Assessment - Initial Assessment Questions 1. COVID-19 DIAGNOSIS: "Who made your COVID-19 diagnosis?" "Was it confirmed by a positive lab test?" If not diagnosed by a HCP, ask "Are there lots of cases (community spread) where you live?" Note: See public health department website, if unsure.     See documentation notes.   Her covid test done yesterday is positive.   She is not having any symptoms per son [name]. 2. COVID-19 EXPOSURE: "Was there any known exposure to COVID before the symptoms began?" CDC Definition of close contact: within 6 feet (2 meters) for a total of 15 minutes or more over a 24-hour period.      *No Answer* 3. ONSET:  "When did the COVID-19 symptoms start?"      Not having symptoms 4. WORST SYMPTOM: "What is your worst symptom?" (e.g., cough, fever, shortness of breath, muscle aches)     Triage stopped once he mentioned his mother is not having symptoms.   She is feeling good like her usual self. 5. COUGH: "Do you have a cough?" If Yes, ask: "How bad is the cough?"       *No Answer* 6. FEVER: "Do you have a fever?" If Yes, ask: "What is your temperature, how was it measured, and when did it start?"     *No Answer* 7. 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  She had a stroke 8. BETTER-SAME-WORSE: "Are you getting better, staying the same or getting worse compared to yesterday?"  If getting worse, ask, "In what way?"     Feels good  9. HIGH RISK DISEASE: "Do you have any chronic medical problems?" (e.g., asthma, heart or lung disease, weak immune system, obesity, etc.)     Yes  Has a trach, has diabetes, had a stroke. 10. VACCINE: "Have you gotten the COVID-19 vaccine?" If Yes ask: "Which one, how many shots, when did you get it?"       *  No Answer* 11. PREGNANCY: "Is there any chance you are pregnant?" "When was your last menstrual period?"       *No Answer* 12. OTHER SYMPTOMS: "Do you have any other symptoms?"  (e.g., chills, fatigue, headache, loss of smell or taste, muscle pain, sore throat; new loss of smell or taste especially support the diagnosis of COVID-19)       *No Answer*  Protocols used: CORONAVIRUS (COVID-19) DIAGNOSED OR SUSPECTED-A-AH

## 2020-06-25 NOTE — Telephone Encounter (Signed)
Medical Assistant used [employer] to contact patient.  Interpreter Name: Marton Redwood #: [id number] Patient verified DOB Patient is aware of TSH being normal and to continue with current regimen. Patient is aware of increasing hydration for slightly decreased kidney function. Patient is is aware of vitamin level d being back to normal and needing to take OTC 2000 units daily. Patient had no concerns.

## 2020-06-25 NOTE — Telephone Encounter (Signed)
-----   Message from Kennieth Rad, [place] sent at 06/23/2020  9:18 AM EST ----- Please call patient and let her know that her thyroid function is within normal limits, continue current regimen. Her kidney function has decreased slightly, she needs to in increase hydration, have this rechecked at next office visit. Her vitamin D level is back within normal limits, she should take 2000 units over-the-counter once a day in order to maintain those limits

## 2020-06-25 NOTE — Telephone Encounter (Signed)
Message has been sent to front desk staff to schedule.

## 2020-06-26 ENCOUNTER — Telehealth (INDEPENDENT_AMBULATORY_CARE_PROVIDER_SITE_OTHER): Payer: Medicare Other | Admitting: Physician Assistant

## 2020-06-26 DIAGNOSIS — U071 COVID-19: Secondary | ICD-10-CM

## 2020-06-26 NOTE — Progress Notes (Signed)
Patient tested positive for COVID the day after her in office visit.  Patient has eaten today and patient has taken medication today. Patient would like information for infusion clinic due to underlining conditions.

## 2020-06-26 NOTE — Progress Notes (Signed)
Established Patient Office Visit  Subjective:  Patient ID: Jamie Mitchell, female    DOB: July 31, 1945  Age: 75 y.o. MRN: [id number]  CC:  Chief Complaint  Patient presents with  . Covid Positive   Virtual Visit via Telephone Note  I connected with Angala Esquivel Venegas on 06/26/20 at 10:50 AM EST by telephone and verified that I am speaking with the correct person using two identifiers.  Location Patient: Home Provider: Primary Care at [facility]   I discussed the limitations, risks, security and privacy concerns of performing an evaluation and management service by telephone and the availability of in person appointments. I also discussed with the patient that there may be a patient responsible charge related to this service. The patient expressed understanding and agreed to proceed.  [id number] interpreter   History of Present Illness: Son is present and provides history due to patient's difficulty with communication.  Reports that patient tested positive for Covid on June 24, 2020.  States that she has not been experiencing any symptoms, states that she was tested with a home COVID test after live-in daughter in law tested positive a few days prior.    Endorses two Moderna Covid vaccines with the last one in May 2021.   Due to language barrier, an interpreter was present during the history-taking and subsequent discussion (and for part of the physical exam) with this patient.    Observations/Objective:  Medical history and current medications reviewed, no physical exam completed   Past Medical History:  Diagnosis Date  . Atrial fibrillation (HCC)   . 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 DM (diabetes mellitus) (HCC)   . HLD (hyperlipidemia)   . Hypertension   . Hypothyroidism   . Renal failure 07/25/2019  . Stroke [facility])     Past Surgical History:  Procedure Laterality Date  . IR CT HEAD LTD  07/20/2019  . IR PERCUTANEOUS ART THROMBECTOMY/INFUSION INTRACRANIAL INC  DIAG ANGIO  07/20/2019  . KNEE SURGERY Left   . RADIOLOGY WITH ANESTHESIA N/A 07/20/2019   Procedure: IR WITH ANESTHESIA;  Surgeon: Julieanne Cotton, MD;  Location: [facility] OR;  Service: Radiology;  Laterality: N/A;  . TRACHEOSTOMY TUBE PLACEMENT N/A 12/27/2019   Procedure: TRACHEOSTOMY;  Surgeon: Serena Colonel, MD;  Location: [facility] OR;  Service: ENT;  Laterality: N/A;    Family History  Problem Relation Age of Onset  . Birth defects Daughter        feet  . Heart disease Sister     Social History   Socioeconomic History  . Marital status: Married    Spouse name: Not on file  . Number of children: 11  . Years of education: Not on file  . Highest education level: Not on file  Occupational History  . Not on file  Tobacco Use  . Smoking status: Never Smoker  . Smokeless tobacco: Never Used  Vaping Use  . Vaping Use: Never used  Substance and Sexual Activity  . Alcohol use: No  . Drug use: No  . Sexual activity: Not Currently    Birth control/protection: None    Comment: Married  Other Topics Concern  . Not on file  Social History Narrative   Lives with son Elita Quick)    Social Determinants of Health   Financial Resource Strain: Not on file  Food Insecurity: Not on file  Transportation Needs: No Transportation Needs  . Lack of Transportation (Medical): No  . Lack of Transportation (Non-Medical): No  Physical Activity: Not on file  Stress: Not on file  Social Connections: Not on file  Intimate Partner Violence: Not on file    Outpatient Medications Prior to Visit  Medication Sig Dispense Refill  . amiodarone (PACERONE) 200 MG tablet Take 1 tablet (200 mg total) by mouth daily. 90 tablet 1  . atorvastatin (LIPITOR) 40 MG tablet Take 1 tablet (40 mg total) by mouth every evening. 90 tablet 3  . diclofenac Sodium (VOLTAREN) 1 % GEL Apply 2 g topically 4 (four) times daily as needed (shoulder pain). 50 g 4  . ELIQUIS 5 MG TABS tablet Take 1 tablet (5 mg total) by mouth 2 (two) times  daily. 60 tablet 6  . EPINEPHrine 0.3 mg/0.3 mL IJ SOAJ injection Inject 0.3 mLs (0.3 mg total) into the muscle as needed for anaphylaxis. 1 each 1  . ergocalciferol (DRISDOL) 1.25 MG (50000 UT) capsule Take 1 capsule (50,000 Units total) by mouth once a week. 12 capsule 0  . ipratropium-albuterol (DUONEB) 0.5-2.5 (3) MG/3ML SOLN Take 3 mLs by nebulization every 6 (six) hours as needed. 360 mL 2  . levothyroxine (SYNTHROID) 75 MCG tablet Take 1 tablet (75 mcg total) by mouth daily before breakfast. 30 tablet 3  . metFORMIN (GLUCOPHAGE) 500 MG tablet Take 2 tablets (1,000 mg total) by mouth 2 (two) times daily with a meal. 180 tablet 1  . ondansetron (ZOFRAN) 4 MG tablet Take 1 tablet (4 mg total) by mouth 3 (three) times daily as needed for nausea or vomiting. 90 tablet 1  . pantoprazole (PROTONIX) 40 MG tablet Take 1 tablet (40 mg total) by mouth 2 (two) times daily before a meal. 60 tablet 6  . promethazine (PHENERGAN) 25 MG tablet Take 1 tablet (25 mg total) by mouth every 8 (eight) hours as needed for nausea or vomiting. 60 tablet 1   No facility-administered medications prior to visit.    No Known Allergies  ROS Review of Systems  Constitutional: Negative for chills and fever.  HENT: Negative for congestion and sore throat.   Eyes: Negative.   Respiratory: Negative for cough.   Cardiovascular: Negative.   Gastrointestinal: Negative for nausea.  Endocrine: Negative.   Genitourinary: Negative.   Musculoskeletal: Negative for myalgias.  Allergic/Immunologic: Negative.   Neurological: Negative.   Hematological: Negative.   Psychiatric/Behavioral: Negative.       Objective:     There were no vitals taken for this visit. Wt Readings from Last 3 Encounters:  03/27/20 135 lb 2 oz (61.3 kg)  03/19/20 136 lb (61.7 kg)  02/01/20 142 lb (64.4 kg)     Health Maintenance Due  Topic Date Due  . OPHTHALMOLOGY EXAM  Never done  . MAMMOGRAM  Never done  . DEXA SCAN  Never done  .  COVID-19 Vaccine (3 - Booster for Moderna series) 04/20/2020    There are no preventive care reminders to display for this patient.  Lab Results  Component Value Date   TSH 0.590 06/18/2020   Lab Results  Component Value Date   WBC 5.8 02/14/2020   HGB 12.7 02/14/2020   HCT 39.6 02/14/2020   MCV 95 02/14/2020   PLT 324 02/14/2020   Lab Results  Component Value Date   NA 140 06/18/2020   K 4.7 06/18/2020   CO2 23 06/18/2020   GLUCOSE 124 (H) 06/18/2020   BUN 9 06/18/2020   CREATININE 1.20 (H) 06/18/2020   BILITOT 0.6 06/18/2020   ALKPHOS 87 06/18/2020   AST 18 06/18/2020   ALT 21  06/18/2020   PROT 6.7 06/18/2020   ALBUMIN 4.1 06/18/2020   CALCIUM 9.0 06/18/2020   ANIONGAP 8 01/04/2020   Lab Results  Component Value Date   CHOL 113 07/20/2019   Lab Results  Component Value Date   HDL 36 (L) 07/20/2019   Lab Results  Component Value Date   LDLCALC 66 07/20/2019   Lab Results  Component Value Date   TRIG 73 12/28/2019   Lab Results  Component Value Date   CHOLHDL 3.1 07/20/2019   Lab Results  Component Value Date   HGBA1C 7.8 (H) 01/03/2020      Assessment & Plan:   Problem List Items Addressed This Visit   None   Visit Diagnoses    COVID-19    -  Primary     Assessment and Plan:  1. COVID-19 Patient education given on isolation guidelines.  Patient is not experiencing any symptoms, encouraged watchful waiting, red flags given for prompt reevaluation.  No AVS created, patient declines my chart  Follow Up Instructions:    I discussed the assessment and treatment plan with the patient. The patient was provided an opportunity to ask questions and all were answered. The patient agreed with the plan and demonstrated an understanding of the instructions.   The patient was advised to call back or seek an in-person evaluation if the symptoms worsen or if the condition fails to improve as anticipated.  I provided 21 minutes of non-face-to-face  time during this encounter.   Latravia Southgate S Brexton Sofia, PA-C   No orders of the defined types were placed in this encounter.   Follow-up: No follow-ups on file.    Loraine Grip [place], PA-C

## 2020-06-29 ENCOUNTER — Encounter: Payer: Self-pay | Admitting: Physician Assistant

## 2020-07-09 ENCOUNTER — Ambulatory Visit: Payer: Medicare Other | Admitting: Family Medicine

## 2020-07-10 ENCOUNTER — Encounter: Payer: Self-pay | Admitting: Internal Medicine

## 2020-07-10 ENCOUNTER — Other Ambulatory Visit: Payer: Self-pay

## 2020-07-10 ENCOUNTER — Ambulatory Visit (INDEPENDENT_AMBULATORY_CARE_PROVIDER_SITE_OTHER): Payer: Medicare Other | Admitting: Internal Medicine

## 2020-07-10 VITALS — BP 110/60 | HR 57 | Ht 58.5 in | Wt 129.0 lb

## 2020-07-10 DIAGNOSIS — R112 Nausea with vomiting, unspecified: Secondary | ICD-10-CM | POA: Diagnosis not present

## 2020-07-10 DIAGNOSIS — R634 Abnormal weight loss: Secondary | ICD-10-CM

## 2020-07-10 DIAGNOSIS — I63512 Cerebral infarction due to unspecified occlusion or stenosis of left middle cerebral artery: Secondary | ICD-10-CM | POA: Diagnosis not present

## 2020-07-10 NOTE — Progress Notes (Signed)
HISTORY OF PRESENT ILLNESS:  Jamie Mitchell is a 75 y.o. female, non-English-speaking native of [place], with multiple medical problems including atrial fibrillation on amiodarone, history of stroke on Eliquis, diabetes mellitus on Metformin, hypertension, hypothyroidism, and a history of acute hypoxic respiratory failure secondary to multifocal pneumonia for which she was hospitalized for 2 weeks in late July and August 2021.  She was evaluated in this office March 27, 2020 regarding problems with recurrent vomiting.  See that dictation for details.  Considerations include gastric volvulus, medication reaction (Metformin), and GERD.  She presents today for follow-up.  Since her last office evaluation she underwent upper GI series.  There was no evidence for obstructive process.  She was placed on pantoprazole 40 mg twice daily.  She has been compliant.  Also prescribed running Zofran.  Did not check on compliance.  She has continued on Metformin without change.  Her symptoms began after leaving the hospital.  That was her only significant new medication.  She is accompanied today by her daughter as well as a Nurse, adult.  Told that her symptom complex is unchanged since her last visit.  She denies abdominal pain.  She continues with slow weight loss.  6 pounds since her last visit.  No hematemesis.  She does have an appointment with community medicine March 9.  She tells me that she has been monitoring her blood sugars regularly.  They have been normal.  REVIEW OF SYSTEMS:  All non-GI ROS negative unless otherwise stated in the HPI except for fatigue  Past Medical History:  Diagnosis Date  . Atrial fibrillation ([place])   . 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 DM (diabetes mellitus) ([place])   . HLD (hyperlipidemia)   . Hypertension   . Hypothyroidism   . Renal failure 07/25/2019  . Stroke [facility])     Past Surgical History:  Procedure Laterality Date  . IR CT HEAD LTD  07/20/2019  .  IR PERCUTANEOUS ART THROMBECTOMY/INFUSION INTRACRANIAL INC DIAG ANGIO  07/20/2019  . KNEE SURGERY Left   . RADIOLOGY WITH ANESTHESIA N/A 07/20/2019   Procedure: IR WITH ANESTHESIA;  Surgeon: Luanne Bras, MD;  Location: [place];  Service: Radiology;  Laterality: N/A;  . TRACHEOSTOMY TUBE PLACEMENT N/A 12/27/2019   Procedure: TRACHEOSTOMY;  Surgeon: Izora Gala, MD;  Location: [place];  Service: ENT;  Laterality: N/A;    Social History Aliena Esquivel Venegas  reports that she has never smoked. She has never used smokeless tobacco. She reports that she does not drink alcohol and does not use drugs.  family history includes Birth defects in her daughter; Heart disease in her sister.  No Known Allergies     PHYSICAL EXAMINATION: Vital signs: BP 110/60   Pulse (!) 57   Ht 4' 10.5" (1.486 m)   Wt 129 lb (58.5 kg)   SpO2 99%   BMI 26.50 kg/m   Constitutional: generally well-appearing, no acute distress Psychiatric: alert and oriented x3, cooperative Eyes: Anicteric Mouth: Mask Abdomen: Not reexamined Skin: No jaundice Neuro: Grossly intact  ASSESSMENT:  1.  Ongoing problems with vomiting or weight loss.  No obstructive process on upper GI series.  No response to high-dose PPI.  Rule out Metformin effect 2.  Multiple medical problems   PLAN:  1.  Stop Metformin to see if this helps GI symptoms.  Notify her PCP. 2.  Monitor blood sugars twice daily.  If blood sugar is above 200-300 contact PCP immediately.  Also, we have reached out to her  PCPs office to have her seen within the next 1 to 2 weeks. 3.  If symptoms resolve with Metformin, PCP can determine alternative therapy for diabetes if necessary 4.  If no improvement off Metformin, look for other causes 5.  CT scan of the abdomen pelvis to assess for other processes that may explain nausea vomiting weight loss issues 6.  GI office follow-up 2 months.  If ongoing problems despite the above then consider gastric emptying  scan Total time of 30 minutes was spent preparing to see the patient, reviewing test, obtaining history, performing exam, counseling the patient and her family, ordering advanced radiology, adjusting medications, changing her outpatient follow-up, and documenting information at the health record.

## 2020-07-10 NOTE — Patient Instructions (Signed)
  [facility] Scheduling se comunicar con usted en los prximos 2 das para programar una tomografa computarizada del abdomen/pelvis. El nmero en su identificador de llamadas ser [phone], Michele Rockers llamen. Si no ha tenido noticias de [employer], llame al [phone] para programar.  La oficina del Dr. Margarita Rana no tena una cita antes, pero luego lo puse en la lista de cancelaciones.

## 2020-07-15 ENCOUNTER — Ambulatory Visit (HOSPITAL_COMMUNITY)
Admission: EM | Admit: 2020-07-15 | Discharge: 2020-07-15 | Disposition: A | Discharge status: Home / Self Care | Payer: Medicare Other | Attending: Internal Medicine | Admitting: Internal Medicine

## 2020-07-15 ENCOUNTER — Encounter (HOSPITAL_COMMUNITY): Payer: Self-pay

## 2020-07-15 ENCOUNTER — Inpatient Hospital Stay (HOSPITAL_COMMUNITY)
Admission: EM | Admit: 2020-07-15 | Discharge: 2020-07-18 | DRG: 308 | Disposition: A | Discharge status: Home / Self Care | Payer: Medicare Other | Attending: Internal Medicine | Admitting: Internal Medicine

## 2020-07-15 ENCOUNTER — Other Ambulatory Visit: Payer: Self-pay

## 2020-07-15 ENCOUNTER — Emergency Department (HOSPITAL_COMMUNITY): Payer: Medicare Other

## 2020-07-15 DIAGNOSIS — I480 Paroxysmal atrial fibrillation: Secondary | ICD-10-CM | POA: Diagnosis present

## 2020-07-15 DIAGNOSIS — Z8249 Family history of ischemic heart disease and other diseases of the circulatory system: Secondary | ICD-10-CM

## 2020-07-15 DIAGNOSIS — Z79890 Hormone replacement therapy (postmenopausal): Secondary | ICD-10-CM

## 2020-07-15 DIAGNOSIS — R1310 Dysphagia, unspecified: Secondary | ICD-10-CM | POA: Diagnosis present

## 2020-07-15 DIAGNOSIS — Z8673 Personal history of transient ischemic attack (TIA), and cerebral infarction without residual deficits: Secondary | ICD-10-CM

## 2020-07-15 DIAGNOSIS — E1169 Type 2 diabetes mellitus with other specified complication: Secondary | ICD-10-CM

## 2020-07-15 DIAGNOSIS — J9610 Chronic respiratory failure, unspecified whether with hypoxia or hypercapnia: Secondary | ICD-10-CM | POA: Diagnosis present

## 2020-07-15 DIAGNOSIS — Z79899 Other long term (current) drug therapy: Secondary | ICD-10-CM

## 2020-07-15 DIAGNOSIS — E119 Type 2 diabetes mellitus without complications: Secondary | ICD-10-CM

## 2020-07-15 DIAGNOSIS — K219 Gastro-esophageal reflux disease without esophagitis: Secondary | ICD-10-CM | POA: Diagnosis present

## 2020-07-15 DIAGNOSIS — R079 Chest pain, unspecified: Secondary | ICD-10-CM | POA: Diagnosis present

## 2020-07-15 DIAGNOSIS — R42 Dizziness and giddiness: Secondary | ICD-10-CM

## 2020-07-15 DIAGNOSIS — I10 Essential (primary) hypertension: Secondary | ICD-10-CM | POA: Diagnosis present

## 2020-07-15 DIAGNOSIS — J386 Stenosis of larynx: Secondary | ICD-10-CM | POA: Diagnosis present

## 2020-07-15 DIAGNOSIS — U071 COVID-19: Secondary | ICD-10-CM | POA: Diagnosis not present

## 2020-07-15 DIAGNOSIS — R0789 Other chest pain: Secondary | ICD-10-CM | POA: Diagnosis not present

## 2020-07-15 DIAGNOSIS — R001 Bradycardia, unspecified: Secondary | ICD-10-CM | POA: Diagnosis present

## 2020-07-15 DIAGNOSIS — I495 Sick sinus syndrome: Secondary | ICD-10-CM | POA: Diagnosis not present

## 2020-07-15 DIAGNOSIS — Z7901 Long term (current) use of anticoagulants: Secondary | ICD-10-CM

## 2020-07-15 DIAGNOSIS — R21 Rash and other nonspecific skin eruption: Secondary | ICD-10-CM | POA: Diagnosis not present

## 2020-07-15 DIAGNOSIS — Z7984 Long term (current) use of oral hypoglycemic drugs: Secondary | ICD-10-CM

## 2020-07-15 DIAGNOSIS — I4892 Unspecified atrial flutter: Secondary | ICD-10-CM | POA: Diagnosis present

## 2020-07-15 DIAGNOSIS — E039 Hypothyroidism, unspecified: Secondary | ICD-10-CM | POA: Diagnosis present

## 2020-07-15 DIAGNOSIS — E785 Hyperlipidemia, unspecified: Secondary | ICD-10-CM | POA: Diagnosis present

## 2020-07-15 DIAGNOSIS — Z930 Tracheostomy status: Secondary | ICD-10-CM

## 2020-07-15 LAB — CBC
HCT: 37.6 % (ref 36.0–46.0)
Hemoglobin: 12.9 g/dL (ref 12.0–15.0)
MCH: 29.9 pg (ref 26.0–34.0)
MCHC: 34.3 g/dL (ref 30.0–36.0)
MCV: 87 fL (ref 80.0–100.0)
Platelets: 389 10*3/uL (ref 150–400)
RBC: 4.32 MIL/uL (ref 3.87–5.11)
RDW: 15.9 % — ABNORMAL HIGH (ref 11.5–15.5)
WBC: 5.1 10*3/uL (ref 4.0–10.5)
nRBC: 0 % (ref 0.0–0.2)

## 2020-07-15 LAB — BASIC METABOLIC PANEL
Anion gap: 10 (ref 5–15)
BUN: 5 mg/dL — ABNORMAL LOW (ref 8–23)
CO2: 23 mmol/L (ref 22–32)
Calcium: 9.1 mg/dL (ref 8.9–10.3)
Chloride: 100 mmol/L (ref 98–111)
Creatinine, Ser: 0.99 mg/dL (ref 0.44–1.00)
GFR, Estimated: >60 mL/min (ref >60)
Glucose, Bld: 103 mg/dL — ABNORMAL HIGH (ref 70–99)
Potassium: 4.2 mmol/L (ref 3.5–5.1)
Sodium: 133 mmol/L — ABNORMAL LOW (ref 135–145)

## 2020-07-15 LAB — TROPONIN I (HIGH SENSITIVITY): Troponin I (High Sensitivity): 4 ng/L (ref <18)

## 2020-07-15 IMAGING — CR DG CHEST 2V
2 series · 2 of 2 positions shown · non-contrast
Comparison: Chest radiograph dated [DATE].

CLINICAL DATA: 74-year-old female with chest pain.

EXAM:
CHEST - 2 VIEW

[chest pa]
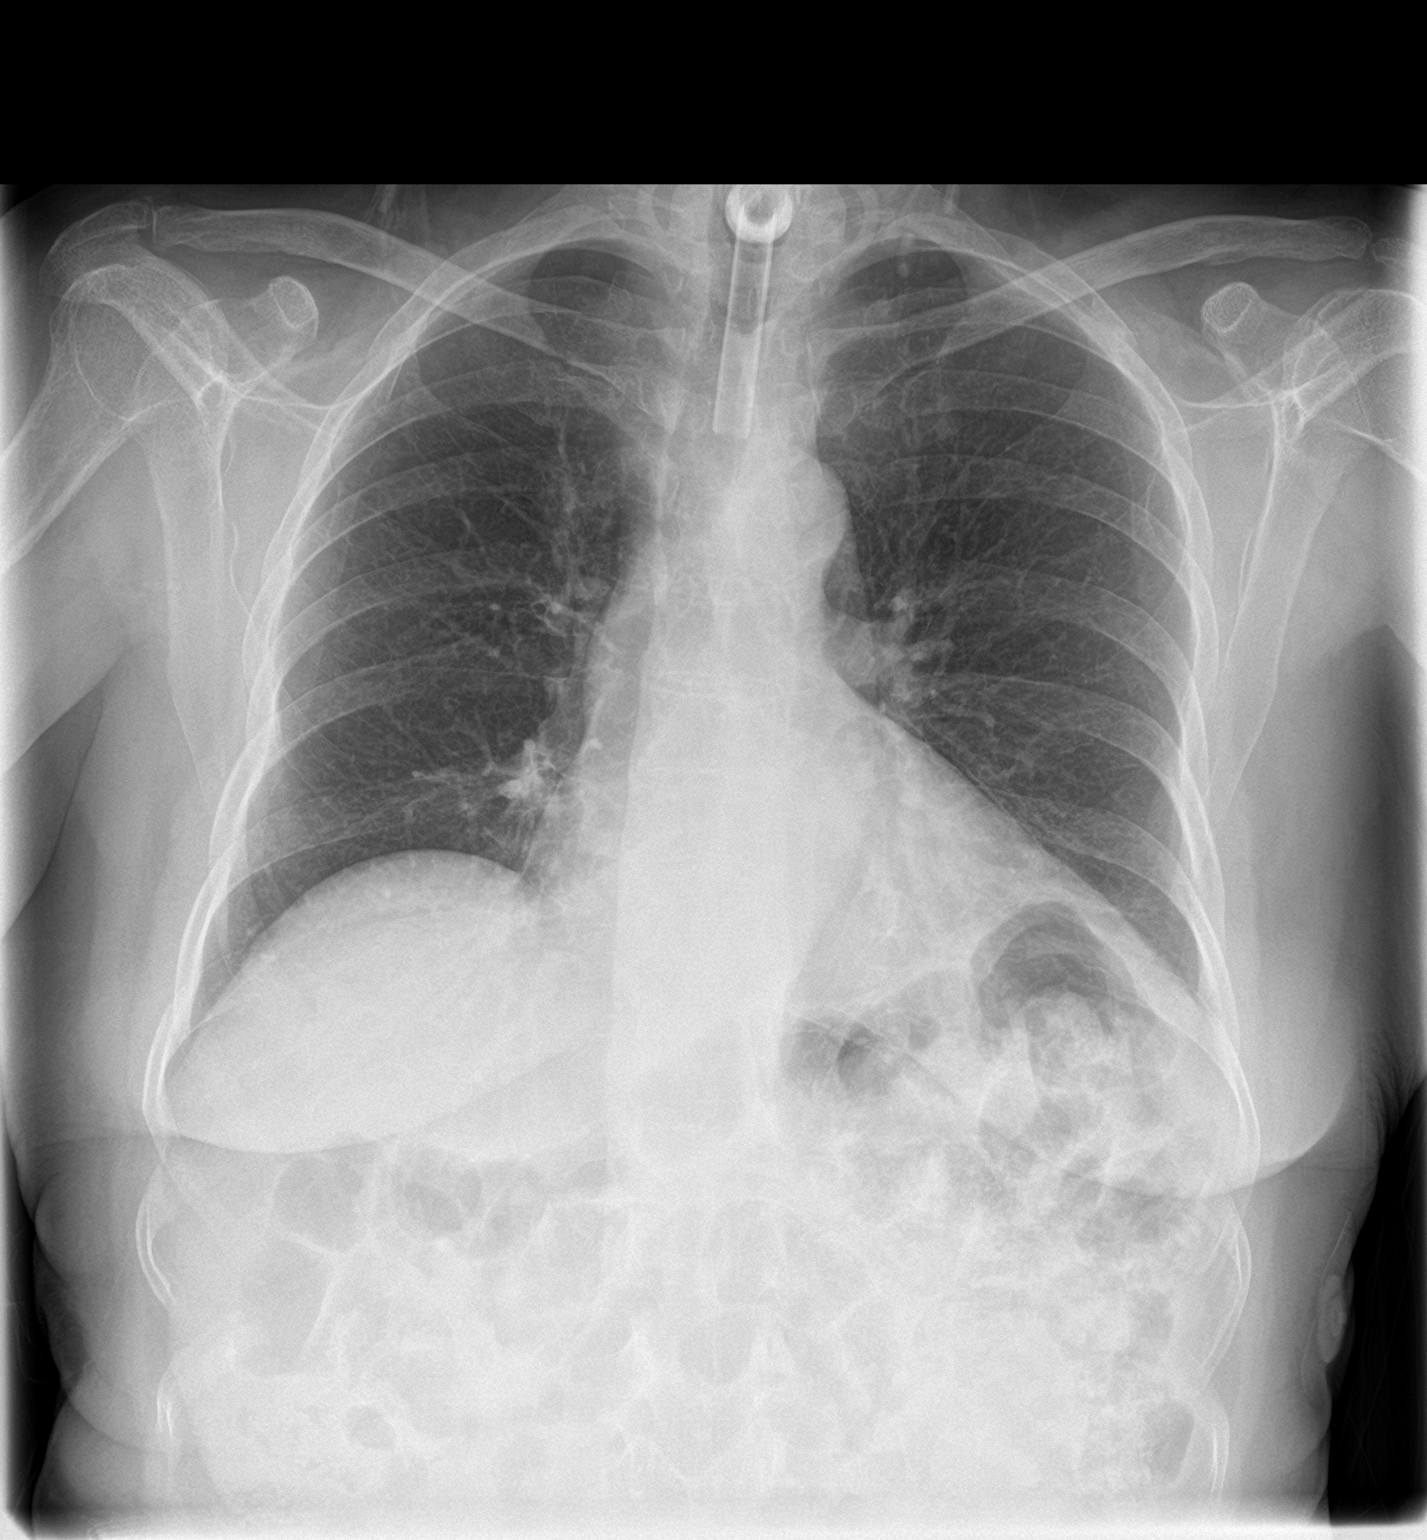

[chest lat]
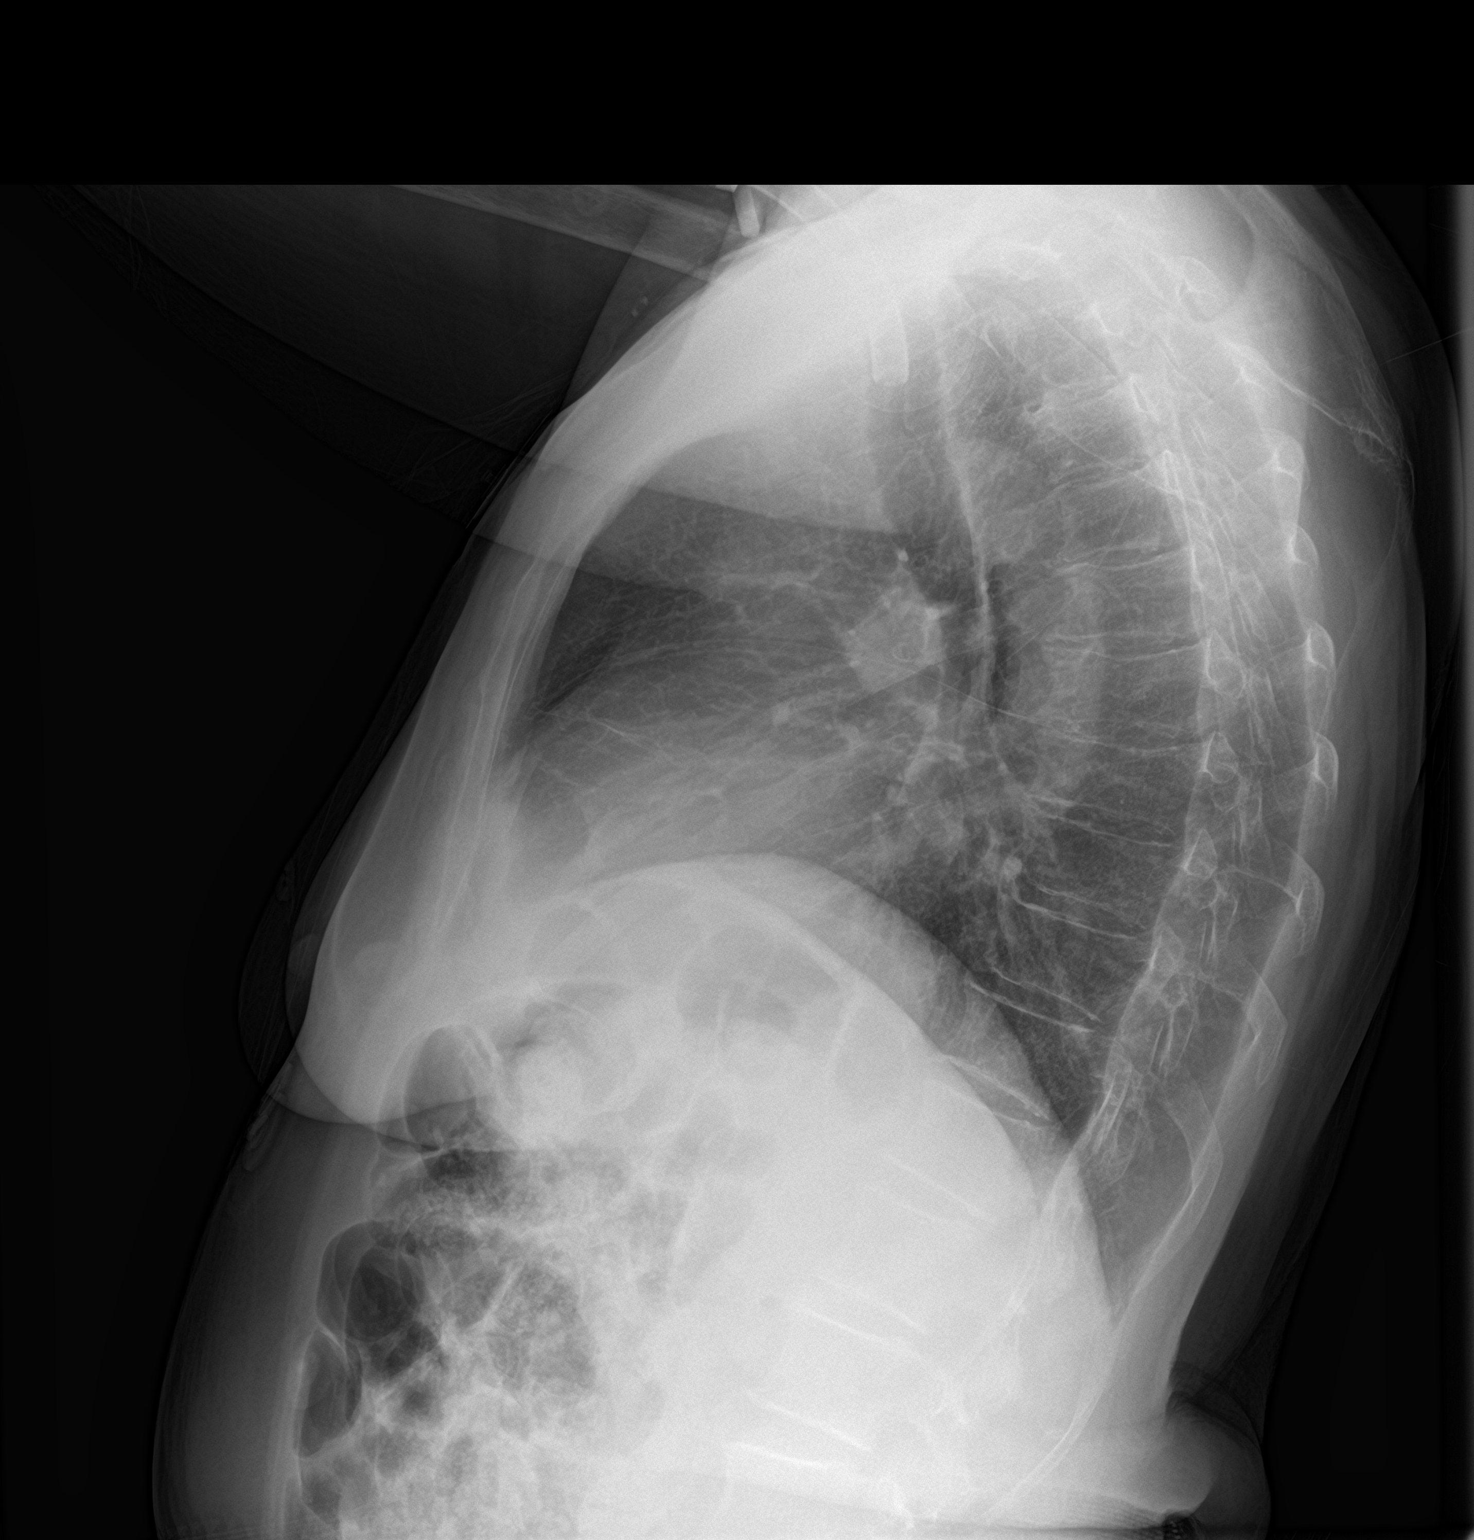

[2 of 2 positions shown; findings below may reference images not displayed]

FINDINGS: Tracheostomy above the carina. No focal consolidation, pleural
effusion or pneumothorax. Stable cardiac silhouette. No acute
osseous pathology.
IMPRESSION: No active cardiopulmonary disease.

## 2020-07-15 NOTE — ED Notes (Signed)
Reported chief complaint to Charge Nurse and vitals taken.

## 2020-07-15 NOTE — ED Triage Notes (Signed)
Pt sent from UC, went for CP, fatigue dizziness and had EKG done and told to come here for abnormal EKG

## 2020-07-15 NOTE — ED Triage Notes (Signed)
Pt from home with reported sharp stabbing chest pain that started last night.  Pt presents with a trach in place.

## 2020-07-15 NOTE — ED Notes (Signed)
TC to [facility] triage to report Pyt coming to ed for ABD EKG.

## 2020-07-16 ENCOUNTER — Encounter (HOSPITAL_COMMUNITY): Payer: Self-pay | Admitting: Internal Medicine

## 2020-07-16 DIAGNOSIS — R001 Bradycardia, unspecified: Secondary | ICD-10-CM | POA: Diagnosis present

## 2020-07-16 DIAGNOSIS — I480 Paroxysmal atrial fibrillation: Secondary | ICD-10-CM | POA: Diagnosis not present

## 2020-07-16 DIAGNOSIS — R0789 Other chest pain: Secondary | ICD-10-CM | POA: Diagnosis not present

## 2020-07-16 DIAGNOSIS — R079 Chest pain, unspecified: Secondary | ICD-10-CM | POA: Diagnosis present

## 2020-07-16 LAB — HEMOGLOBIN A1C
Hgb A1c MFr Bld: 5.9 % — ABNORMAL HIGH (ref 4.8–5.6)
Mean Plasma Glucose: 122.63 mg/dL

## 2020-07-16 LAB — TROPONIN I (HIGH SENSITIVITY): Troponin I (High Sensitivity): 5 ng/L (ref <18)

## 2020-07-16 LAB — RESP PANEL BY RT-PCR (FLU A&B, COVID) ARPGX2
Influenza A by PCR: NEGATIVE
Influenza B by PCR: NEGATIVE
SARS Coronavirus 2 by RT PCR: POSITIVE — AB

## 2020-07-16 LAB — CBG MONITORING, ED
Glucose-Capillary: 118 mg/dL — ABNORMAL HIGH (ref 70–99)
Glucose-Capillary: 98 mg/dL (ref 70–99)

## 2020-07-16 MED ORDER — PANTOPRAZOLE SODIUM 40 MG PO TBEC
40.0000 mg | DELAYED_RELEASE_TABLET | Freq: Two times a day (BID) | ORAL | Status: DC
  Administered 2020-07-16 – 2020-07-18 (×5): 40 mg via ORAL
  Filled 2020-07-16 (×5): qty 1

## 2020-07-16 MED ORDER — APIXABAN 5 MG PO TABS
5.0000 mg | ORAL_TABLET | Freq: Two times a day (BID) | ORAL | Status: DC
  Administered 2020-07-16 – 2020-07-18 (×5): 5 mg via ORAL
  Filled 2020-07-16 (×5): qty 1

## 2020-07-16 MED ORDER — ACETAMINOPHEN 325 MG PO TABS: 650.0000 mg | ORAL_TABLET | ORAL | Status: DC | PRN

## 2020-07-16 MED ORDER — ATORVASTATIN CALCIUM 40 MG PO TABS
40.0000 mg | ORAL_TABLET | Freq: Every evening | ORAL | Status: DC
  Administered 2020-07-16 – 2020-07-17 (×2): 40 mg via ORAL
  Filled 2020-07-16 (×2): qty 4
  Filled 2020-07-16: qty 1

## 2020-07-16 MED ORDER — LEVOTHYROXINE SODIUM 75 MCG PO TABS
75.0000 ug | ORAL_TABLET | Freq: Every day | ORAL | Status: DC
  Administered 2020-07-16 – 2020-07-18 (×3): 75 ug via ORAL
  Filled 2020-07-16 (×2): qty 1
  Filled 2020-07-16: qty 3

## 2020-07-16 MED ORDER — INSULIN ASPART 100 UNIT/ML ~~LOC~~ SOLN: 0.0000 [IU] | Freq: Three times a day (TID) | SUBCUTANEOUS | Status: DC

## 2020-07-16 MED ORDER — ONDANSETRON HCL 4 MG/2ML IJ SOLN: 4.0000 mg | Freq: Four times a day (QID) | INTRAMUSCULAR | Status: DC | PRN

## 2020-07-16 NOTE — ED Notes (Signed)
Lunch Tray Ordered @ 1006.  

## 2020-07-16 NOTE — H&P (Addendum)
History and Physical    Jamie Mitchell NID:[id number] DOB: [birth year] DOA: 07/15/2020  PCP: Charlott Rakes, MD Consultants:  Bonne Dolores - ENT Patient coming from:  Home - lives with son, his wife, and 4 grandchildren; NOK: Opal Sidles, [phone]  Chief Complaint: Chest pain  HPI: Jamie Mitchell is a 75 y.o. 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  She came to visit a year ago and had a stroke and so has stayed.  Her son noticed on Sunday that her heart rate was very low, down to 30 - he checked with BP cuff and pulse ox and both showed that.  She was not having pain or dizziness.  Yesterday AM, her HR was up to 51.  Later in the day, she felt dizzy and was unsteady with her walking.  Mild left-sided CP.  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  CP has resolved.  She has also been vomiting for "a while" - she vomits after eating small meals periodically.  She is always fatigued.    ED Course:  Carryover, per Dr. Alcario Drought:  75 yo with chronic trach, h/o stroke. Had CP today, associated with lightheaded spells, UC found low HR. EKG at 1700 yesterday with S.Brady with pauses. EDP spoke with cards who recd tele obs.   Review of Systems: As per HPI; otherwise review of systems reviewed and negative.   Ambulatory Status:  Ambulates without assistance  COVID Vaccine Status:  Complete  Past Medical History:  Diagnosis Date  . Atrial fibrillation ([place])   . 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 DM (diabetes mellitus) ([place])   . HLD (hyperlipidemia)   . Hypertension   . Hypothyroidism   . Renal failure 07/25/2019  . Stroke [facility])     Past Surgical History:  Procedure  Laterality Date  . IR CT HEAD LTD  07/20/2019  . IR PERCUTANEOUS ART THROMBECTOMY/INFUSION INTRACRANIAL INC DIAG ANGIO  07/20/2019  . KNEE SURGERY Left   . RADIOLOGY WITH ANESTHESIA N/A 07/20/2019   Procedure: IR WITH ANESTHESIA;  Surgeon: Luanne Bras, MD;  Location: [place];  Service: Radiology;  Laterality: N/A;  . TRACHEOSTOMY TUBE PLACEMENT N/A 12/27/2019   Procedure: TRACHEOSTOMY;  Surgeon: Izora Gala, MD;  Location: [place];  Service: ENT;  Laterality: N/A;    Social History   Socioeconomic History  . Marital status: Married    Spouse name: Not on file  . Number of children: 11  . Years of education: Not on file  . Highest education level: Not on file  Occupational History  . Not on file  Tobacco Use  . Smoking status: Never Smoker  . Smokeless tobacco: Never Used  Vaping Use  . Vaping Use: Never used  Substance and Sexual Activity  . Alcohol use: No  . Drug use: No  . Sexual activity: Not Currently    Birth control/protection: None    Comment: Married  Other Topics Concern  . Not on file  Social History Narrative   Lives with son [name])    Social Determinants of Health   Financial Resource Strain: Not on file  Food Insecurity: Not on file  Transportation Needs: No Transportation Needs  . Lack of Transportation (Medical): No  .  Lack of Transportation (Non-Medical): No  Physical Activity: Not on file  Stress: Not on file  Social Connections: Not on file  Intimate Partner Violence: Not on file    No Known Allergies  Family History  Problem Relation Age of Onset  . Birth defects Daughter        feet  . Heart disease Sister     Prior to Admission medications   Medication Sig Start Date End Date Taking? Authorizing Provider  amiodarone (PACERONE) 200 MG tablet Take 1 tablet (200 mg total) by mouth daily. 01/04/20  Yes Mercy Riding, MD  atorvastatin (LIPITOR) 40 MG tablet Take 1 tablet (40 mg total) by mouth every evening. 01/15/20  Yes Elsie Stain,  MD  diclofenac Sodium (VOLTAREN) 1 % GEL Apply 2 g topically 4 (four) times daily as needed (shoulder pain). 01/15/20  Yes Elsie Stain, MD  ELIQUIS 5 MG TABS tablet Take 1 tablet (5 mg total) by mouth 2 (two) times daily. 01/15/20  Yes Elsie Stain, MD  EPINEPHrine 0.3 mg/0.3 mL IJ SOAJ injection Inject 0.3 mLs (0.3 mg total) into the muscle as needed for anaphylaxis. 11/20/19  Yes Brimage, Ronnette Juniper, DO  levothyroxine (SYNTHROID) 75 MCG tablet Take 1 tablet (75 mcg total) by mouth daily before breakfast. 03/20/20  Yes Newlin, Enobong, MD  ondansetron (ZOFRAN) 4 MG tablet Take 1 tablet (4 mg total) by mouth 3 (three) times daily as needed for nausea or vomiting. 03/27/20  Yes Irene Shipper, MD  pantoprazole (PROTONIX) 40 MG tablet Take 1 tablet (40 mg total) by mouth 2 (two) times daily before a meal. 04/01/20  Yes Irene Shipper, MD  Pseudoeph-Doxylamine-DM-APAP (NYQUIL PO) Take 1 Dose by mouth at bedtime as needed (cough).   Yes [provider]  metFORMIN (GLUCOPHAGE) 500 MG tablet Take 2 tablets (1,000 mg total) by mouth 2 (two) times daily with a meal. Patient not taking: No sig reported 01/04/20   Mercy Riding, MD    Physical Exam: Vitals:   07/16/20 1200 07/16/20 1300 07/16/20 1407 07/16/20 1515  BP: 127/76 (!) 122/93 105/72 (!) 120/99  Pulse: 62 (!) 56 64 (!) 56  Resp: 13 19 14 18   Temp:      TempSrc:      SpO2: 98% 97% 100% 95%     . General:  Appears calm and comfortable and is in NAD, chronically ill . Eyes:  PERRL, EOMI, normal lids, iris . ENT:  grossly normal hearing, lips & tongue, mmm . Neck:  Trach in place . Cardiovascular:  RR with bradycardia, no m/r/g. No LE edema.  [name] Respiratory:   CTA bilaterally with no wheezes/rales/rhonchi.  Normal respiratory effort. . Abdomen:  soft, NT, ND, NABS . Skin:  no rash or induration seen on limited exam . Musculoskeletal:  grossly normal tone BUE/BLE, good ROM, no bony abnormality . Psychiatric: blunted mood and  affect, speech fluent and appropriate . Neurologic:  CN 2-12 grossly intact, moves all extremities in coordinated fashion    Radiological Exams on Admission: Independently reviewed - see discussion in A/P where applicable  DG Chest 2 View  Result Date: 07/15/2020 CLINICAL DATA:  75 year old female with chest pain. EXAM: CHEST - 2 VIEW COMPARISON:  Chest radiograph dated 12/23/2019. FINDINGS: Tracheostomy above the carina. No focal consolidation, pleural effusion or pneumothorax. Stable cardiac silhouette. No acute osseous pathology. IMPRESSION: No active cardiopulmonary disease. Electronically Signed   By: Anner Crete M.D.   On: 07/15/2020 20:43  EKG: Independently reviewed.  Sinus bradycardia with rate 55; low voltage   Labs on Admission: I have personally reviewed the available labs and imaging studies at the time of the admission.  Pertinent labs:   Unremarkable BMP HS troponin 4, 5 Normal CBC COVID POSITIVE (from 1/31)   Assessment/Plan Principal Problem:   Symptomatic bradycardia Active Problems:   Hypothyroidism   Hypertension   Hyperlipidemia   Type 2 diabetes mellitus (HCC)   Tracheostomy dependence (HCC)   Chest pain   Paroxysmal atrial fibrillation (HCC)   Bradycardia with mild CP -Afib with bradycardia, rate controlled with Amiodarone -Likely needs Amio dosage adjustment vs. Alternative therapy -Will hold Amio for now -TSH was normal on 1/25.  -Keep potassium >4, calcium >9, magnesium >2.  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 -Followed by trach clinic, due for trach change in another week -Trach care  ordered  CVA -Generally independent and ambulates without assistance (not currently since light-headed/weak with bradycardia) -Came to visit 1 year ago and had CVA so has not been able to return home  HTN -Not on home medications  HLD -Continue Lipitor  DM -Will check A1c -hold Glucophage -Cover with moderate-scale SSI  Hypothyroidism -Recent normal TSH -Continue Synthroid at current dose for now  Afib -Continue Eliquis      Level of care: Telemetry CardiacTelemetry DVT prophylaxis: Eliquis Code Status:  Full - confirmed with patient/family Family Communication: Son was present throughout evaluation Disposition Plan:  The patient is from: home  Anticipated d/c is to: home without [facility] services  Anticipated d/c date will depend on clinical response to treatment, but possibly as early as tomorrow if she has excellent response to treatment  Patient is currently: acutely ill Consults called: Cardiology Admission status:  It is my clinical opinion that referral for OBSERVATION is reasonable and necessary in this patient based on the above information provided. The aforementioned taken together are felt to place the patient at high risk for further clinical deterioration. However it is anticipated that the patient may be medically stable for discharge from the hospital within 24 to 48 hours.    Karmen Bongo MD [facility]   How to contact the [facility] Attending or Consulting provider [place] or covering provider during after hours [place], for this patient?  1. Check the care team in [facility] and look for a) attending/consulting TR[facility] provider listed and b) the [facility] team listed 2. Log into [email or web address] and use [place]'s universal password to access. If you do not have the password, please contact the hospital operator. 3. Locate the [facility] provider you are looking for under [facility] and page to a number that you can be directly reached. 4. If you still have difficulty  reaching the provider, please page the [facility] (Director on Call) for the Hospitalists listed on amion for assistance.   07/16/2020, 6:24 PM

## 2020-07-16 NOTE — Consult Note (Addendum)
Cardiology Consultation:   Patient ID: Jamie Mitchell MRN: [id number]; DOB: Jan 11, 1946  Admit date: 07/15/2020 Date of Consult: 07/16/2020  PCP:  Charlott Rakes, [place]  Cardiologist:  Donato Heinz, MD   Patient Profile:   Jamie Mitchell is a 75 y.o. 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 Lorin Mercy.  Patient moved from [place] to [place] 05/2019.  She was admitted on 07/19/2019 to [facility] with acute CVA.  TPA was administered, and CTA head showed M2 occlusion.  Thrombectomy and revascularization were complicated by subarachnoid hemorrhage.  Course was complicated by intermittent atrial fibrillation/atrial flutter with RVR with postconversion pauses up to 8 seconds.  EP was consulted, and she was started on amiodarone.  She was discharged on Eliquis 5 mg twice daily and amiodarone 200 mg twice daily.  TTE during admission showed EF 60 to 65%, normal RV function, mild MR. After discharge from hospital, a Zio patch x14 days was done, which showed numerous sinus pauses, longest lasting 3.3 seconds.  She was referred to EP and PPM was planned.  However, this was put on hold as she was admitted to [facility] from 6/7 through 11/01/2019 with shortness of breath and stridor.  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  She received epinephrine in the ED.  She was discharged on antibiotics and a steroid taper.  She was subsequently admitted from 12/21/2019 through 01/04/2020 with acute hypoxic respiratory failure due to multifocal pneumonia.  Course was complicated by severe sepsis, ongoing subglottic stenosis  likely secondary to chronic aspiration.  She underwent tracheostomy on 8/4.  Last seen by Dr. Lovena Le 02/01/2020.  Patient was asymptomatic.  Recommended watchful waiting due to risk of infection.  Tested positive for COVID-19 on June 24, 2020.  She was tested as other family member was positive.  Patient is asymptomatic.  Seen by GI 07/10/20 for vomiting and weight loss. Stopped Metformin to see response.   History of Present Illness:   Ms. Jamie Mitchell was doing fine up until Sunday evening when starting to feeling weak and dizzy while walking in the kitchen.  Son check patient's pulse and noted in 54s.  Symptoms lasted for 15 to 20 minutes.  Monday morning heart heart rate was in the 50s.  However in afternoon patient had recurrent symptoms of dizziness with near syncope.  She feld very weak with heart rate in 30s.  Chest pain.  Went to urgent care for further evaluation and sent to ER.  Heart rate in 50s is occasionally going to 80s with intermittent pause.  History obtained from reviewing chart and from son at bedside.   Troponin negative Creatinine and electrolyte normal Hemoglobin A1c 5.9 Covid positive Chest x-ray without acute finding  Past Medical History:  Diagnosis Date  . Atrial fibrillation ([place])   . 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 DM (diabetes mellitus) ([place])   . HLD (hyperlipidemia)   . Hypertension   . Hypothyroidism   . Renal failure 07/25/2019  . Stroke [facility])     Past Surgical History:  Procedure Laterality Date  . IR CT HEAD LTD  07/20/2019  . IR PERCUTANEOUS ART THROMBECTOMY/INFUSION INTRACRANIAL INC DIAG ANGIO  07/20/2019  .  KNEE SURGERY Left   . RADIOLOGY WITH ANESTHESIA N/A 07/20/2019   Procedure: IR WITH ANESTHESIA;  Surgeon: Luanne Bras, MD;  Location: [place];  Service: Radiology;  Laterality: N/A;  . TRACHEOSTOMY TUBE PLACEMENT N/A 12/27/2019   Procedure: TRACHEOSTOMY;  Surgeon: Izora Gala, MD;  Location: [place];  Service: ENT;  Laterality: N/A;     Inpatient Medications: Scheduled Meds: . apixaban  5 mg Oral BID  . atorvastatin  40 mg Oral QPM  . insulin aspart  0-15 Units Subcutaneous TID WC  . levothyroxine  75 mcg Oral QAC breakfast  . pantoprazole  40 mg Oral BID AC   Continuous Infusions:  PRN Meds: acetaminophen, ondansetron (ZOFRAN) IV  Allergies:   No Known Allergies  Social History:   Social History   Socioeconomic History  . Marital status: Married    Spouse name: Not on file  . Number of children: 11  . Years of education: Not on file  . Highest education level: Not on file  Occupational History  . Not on file  Tobacco Use  . Smoking status: Never Smoker  . Smokeless tobacco: Never Used  Vaping Use  . Vaping Use: Never used  Substance and Sexual Activity  . Alcohol use: No  . Drug use: No  . Sexual activity: Not Currently    Birth control/protection: None    Comment: Married  Other Topics Concern  . Not on file  Social History Narrative   Lives with son [name])    Social Determinants of Health   Financial Resource Strain: Not on file  Food Insecurity: Not on file  Transportation Needs: No Transportation Needs  . Lack of Transportation (Medical): No  . Lack of Transportation (Non-Medical): No  Physical Activity: Not on file  Stress: Not on file  Social Connections: Not on file  Intimate Partner Violence: Not on file    Family History:   Family History  Problem Relation Age of Onset  . Birth defects Daughter        feet  . Heart disease Sister      ROS:  Please see the history of present illness.  All other ROS reviewed and negative.     Physical Exam/Data:   Vitals:   07/16/20 1200 07/16/20 1300 07/16/20 1407 07/16/20 1515  BP: 127/76 (!) 122/93 105/72 (!) 120/99  Pulse: 62 (!) 56 64 (!) 56  Resp: 13 19 14 18   Temp:      TempSrc:      SpO2: 98% 97% 100% 95%   No intake or output data in the 24 hours ending 07/16/20 1635 Last 3 Weights 07/10/2020 03/27/2020 03/19/2020   Weight (lbs) 129 lb 135 lb 2 oz 136 lb  Weight (kg) 58.514 kg 61.292 kg 61.689 kg     There is no height or weight on file to calculate BMI.  General:  Well nourished, well developed, in no acute distress HEENT: S/p tracheostomy Lymph: no adenopathy Neck: no JVD Endocrine:  No thryomegaly Vascular: No carotid bruits; FA pulses 2+ bilaterally without bruits  Cardiac:  normal S1, S2; RRR; no murmur  Lungs:  clear to auscultation bilaterally, no wheezing, rhonchi or rales  Abd: soft, nontender, no hepatomegaly  Ext: no edema Musculoskeletal:  No deformities, BUE and BLE strength normal and equal Skin: warm and dry  Neuro:  CNs 2-12 intact, no focal abnormalities noted Psych:  Normal affect   EKG:  The EKG was personally reviewed and demonstrates:  Sinus bradycardia HR 55  bpm Telemetry:  Telemetry was personally reviewed and demonstrates: Sinus rhythm/bradycardia heart rate of 50 to 80s, intermittent heart rate in 30s  Relevant CV Studies:  Monitor 08/2019   Numerous sinus pauses, longest lasting 3.3 seconds (occured at 1:53pm)   14 days of data recorded on Zio monitor. Patient had a min HR of 23 bpm, max HR of 136 bpm, and avg HR of 45 bpm. Predominant underlying rhythm was Sinus Rhythm. No VT, atrial fibrillation, or high degree block noted.  8 runs of SVT occurred, longest lasting 7 beats.  101 pauses occurred, longest lasting 3.3 seconds.  Isolated atrial and ventricular ectopy was rare (<1%). There were 0 triggered events.   Echo 06/2019 1. Left ventricular ejection fraction, by estimation, is 60 to 65%. The  left ventricle has normal function. The left ventricle has no regional  wall motion abnormalities. Left ventricular diastolic parameters were  normal.  2. Right ventricular systolic function is normal. The right ventricular  size is normal. Tricuspid regurgitation signal is inadequate for assessing  PA pressure.  3. The mitral valve is normal in structure and function.  Mild mitral  valve regurgitation. No evidence of mitral stenosis.  4. The aortic valve is normal in structure and function. Aortic valve  regurgitation is not visualized. No aortic stenosis is present.  5. The inferior vena cava is normal in size with greater than 50%  respiratory variability, suggesting right atrial pressure of 3 mmHg.   Laboratory Data:  High Sensitivity Troponin:   Recent Labs  Lab 07/15/20 2031 07/15/20 2206  TROPONINIHS 4 5     Chemistry Recent Labs  Lab 07/15/20 2031  NA 133*  K 4.2  CL 100  CO2 23  GLUCOSE 103*  BUN 5*  CREATININE 0.99  CALCIUM 9.1  GFRNONAA >60  ANIONGAP 10    No results for input(s): PROT, ALBUMIN, AST, ALT, ALKPHOS, BILITOT in the last 168 hours. Hematology Recent Labs  Lab 07/15/20 2031  WBC 5.1  RBC 4.32  HGB 12.9  HCT 37.6  MCV 87.0  MCH 29.9  MCHC 34.3  RDW 15.9*  PLT 389   Radiology/Studies:  DG Chest 2 View  Result Date: 07/15/2020 CLINICAL DATA:  74 year old female with chest pain. EXAM: CHEST - 2 VIEW COMPARISON:  Chest radiograph dated 12/23/2019. FINDINGS: Tracheostomy above the carina. No focal consolidation, pleural effusion or pneumothorax. Stable cardiac silhouette. No acute osseous pathology. IMPRESSION: No active cardiopulmonary disease. Electronically Signed   By: Anner Crete M.D.   On: 07/15/2020 20:43     Assessment and Plan:   1. Symptomatic bradycardia 2. Tachybradycardia syndrome  Patient with prior history of atrial fibrillation/flutter with postconversion pause up to 8 sec white admitted for stroke 06/2019. Outpatient monitor showed multiple sinus pauses, longest lasting 3.3 seconds.  Previously recommended pacemaker but delayed secondary to tracheostomy.  She was asymptomatic when seen by Dr. Lovena Le last time September 2021 and watchful waiting recommended.  Now presented with symptomatic bradycardia.  Initial EKG with junctional bradycardia and since then HR better.  She was on  amiodarone 200 mg daily which is held. Continue to hold amiodarone. Watch overnight for any bradycardia. May be EP eval in AM. Check TSH.    3. Paroxysmal atrial fibrillation/flutter -Maintaining sinus rhythm -Amiodarone held due to bradycardia (continue to hold) -Continue Eliquis  4. COVID 44 -Initially diagnosed June 24, 2020 after family member tested positive -Patient is essentially asymptomatic -Covid positive here -Per primary team  5.  Chest pain -  Troponin negative -Likely demand  6. DM/ Vomiting - Seen by GI 07/10/20 for vomiting and weight loss. Stopped Metformin to see response.  - Per primary team   Risk Assessment/Risk Scores:   HEAR Score (for undifferentiated chest pain):  HEAR Score: 3{  CHA2DS2-VASc Score = 6  This indicates a 9.7% annual risk of stroke. The patient's score is based upon: CHF History: No HTN History: Yes Diabetes History: Yes Stroke History: Yes Vascular Disease History: No Age Score: 1 Gender Score: 1   For questions or updates, please contact [place] Please consult [email or web address] for contact info under    Jarrett Soho, [place]  07/16/2020 4:35 PM

## 2020-07-16 NOTE — ED Provider Notes (Signed)
[facility] Emergency Department Provider Note MRN:  [id number]  Arrival date & time: 07/16/20     Chief Complaint   Dizziness   History of Present Illness   Jamie Mitchell is a 75 y.o. year-old female with a history of hypertension, diabetes, A. fib, stroke, chronic respiratory failure status post trach presenting to the ED with chief complaint of dizziness.  Chest pain throughout the day today accompanied by lightheadedness.  Found to have heart rate in the 30s at home and at urgent care, sent here for further evaluation.  Continued pressure-like pain.  Seems a bit better than earlier today.  Denies any fever, has chronic cough, no abdominal pain.  Review of Systems  A complete 10 system review of systems was obtained and all systems are negative except as noted in the HPI and PMH.   Patient's Health History    Past Medical History:  Diagnosis Date  . Atrial fibrillation ([place])   . 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 DM (diabetes mellitus) ([place])   . HLD (hyperlipidemia)   . Hypertension   . Hypothyroidism   . Renal failure 07/25/2019  . Stroke [facility])     Past Surgical History:  Procedure Laterality Date  . IR CT HEAD LTD  07/20/2019  . IR PERCUTANEOUS ART THROMBECTOMY/INFUSION INTRACRANIAL INC DIAG ANGIO  07/20/2019  . KNEE SURGERY Left   . RADIOLOGY WITH ANESTHESIA N/A 07/20/2019   Procedure: IR WITH ANESTHESIA;  Surgeon: Luanne Bras, MD;  Location: [place];  Service: Radiology;  Laterality: N/A;  . TRACHEOSTOMY TUBE PLACEMENT N/A 12/27/2019   Procedure: TRACHEOSTOMY;  Surgeon: Izora Gala, MD;  Location: [facility] OR;  Service: ENT;  Laterality: N/A;    Family History  Problem Relation Age of Onset  . Birth defects Daughter        feet  . Heart disease Sister     Social History   Socioeconomic History  . Marital status: Married    Spouse name: Not on file  . Number of children: 11  . Years of education: Not on file  . Highest education  level: Not on file  Occupational History  . Not on file  Tobacco Use  . Smoking status: Never Smoker  . Smokeless tobacco: Never Used  Vaping Use  . Vaping Use: Never used  Substance and Sexual Activity  . Alcohol use: No  . Drug use: No  . Sexual activity: Not Currently    Birth control/protection: None    Comment: Married  Other Topics Concern  . Not on file  Social History Narrative   Lives with son [name])    Social Determinants of Health   Financial Resource Strain: Not on file  Food Insecurity: Not on file  Transportation Needs: No Transportation Needs  . Lack of Transportation (Medical): No  . Lack of Transportation (Non-Medical): No  Physical Activity: Not on file  Stress: Not on file  Social Connections: Not on file  Intimate Partner Violence: Not on file     Physical Exam   Vitals:   07/16/20 0225 07/16/20 0425  BP: 117/69 123/70  Pulse: (!) 49 (!) 53  Resp: 16 (!) 23  Temp: 98.4 F (36.9 C)   SpO2: 99% 97%    CONSTITUTIONAL: Chronically ill-appearing, NAD NEURO:  Alert and oriented x 3, no focal deficits EYES:  eyes equal and reactive ENT/NECK:  no LAD, no JVD, tracheostomy in place CARDIO: Rate rate, well-perfused, normal S1 and S2 PULM:  CTAB no wheezing  or rhonchi GI/GU:  normal bowel sounds, non-distended, non-tender MSK/SPINE:  No gross deformities, no edema SKIN:  no rash, atraumatic PSYCH:  Appropriate speech and behavior  *Additional and/or pertinent findings included in MDM below  Diagnostic and Interventional Summary    EKG Interpretation  Date/Time:  Monday July 15 2020 20:12:00 EST Ventricular Rate:  55 PR Interval:  182 QRS Duration: 80 QT Interval:  460 QTC Calculation: 440 R Axis:   42 Text Interpretation: Sinus bradycardia Low voltage QRS Cannot rule out Anterior infarct , age undetermined Abnormal ECG Confirmed by Gerlene Fee [phone]) on 07/16/2020 3:41:12 AM      Labs Reviewed  RESP PANEL BY RT-PCR (FLU A&B,  COVID) ARPGX2 - Abnormal; Notable for the following components:      Result Value   SARS Coronavirus 2 by RT PCR POSITIVE (*)    All other components within normal limits  BASIC METABOLIC PANEL - Abnormal; Notable for the following components:   Sodium 133 (*)    Glucose, Bld 103 (*)    BUN 5 (*)    All other components within normal limits  CBC - Abnormal; Notable for the following components:   RDW 15.9 (*)    All other components within normal limits  TROPONIN I (HIGH SENSITIVITY)  TROPONIN I (HIGH SENSITIVITY)    DG Chest 2 View  Final Result      Medications - No data to display   Procedures  /  Critical Care Procedures  ED Course and Medical Decision Making  I have reviewed the triage vital signs, the nursing notes, and pertinent available records from the EMR.  Listed above are laboratory and imaging tests that I personally ordered, reviewed, and interpreted and then considered in my medical decision making (see below for details).  Chest pain, multiple risk factors but EKG without ischemic features, troponin negative x2.  EKG done yesterday evening with low rate, sinus bradycardia with PACs and sinus pauses.  Possible that patient experiencing symptomatic bradycardia.  These symptoms and EKG reviewed by Dr. Paticia Stack, agrees that it is sinus rhythm, agrees it would be reasonable to admit for telemetry.  Accepted for admission by hospitalist service, possibly for observation.       Barth Kirks. Sedonia Small, [place] [email or web address]  Final Clinical Impressions(s) / ED Diagnoses     ICD-10-CM   1. Lightheadedness  R42   2. Chest pain, unspecified type  R07.9     ED Discharge Orders    None       Discharge Instructions Discussed with and Provided to Patient:   Discharge Instructions   None       Maudie Flakes, MD 07/16/20 [phone]

## 2020-07-16 NOTE — ED Notes (Signed)
Md at bedside

## 2020-07-16 NOTE — ED Notes (Signed)
Per Son at bedside. Pt tested positive for covid on 06/24/2020. Pt had text to verify date.

## 2020-07-17 DIAGNOSIS — J9610 Chronic respiratory failure, unspecified whether with hypoxia or hypercapnia: Secondary | ICD-10-CM | POA: Diagnosis present

## 2020-07-17 DIAGNOSIS — E038 Other specified hypothyroidism: Secondary | ICD-10-CM

## 2020-07-17 DIAGNOSIS — E785 Hyperlipidemia, unspecified: Secondary | ICD-10-CM | POA: Diagnosis present

## 2020-07-17 DIAGNOSIS — I480 Paroxysmal atrial fibrillation: Secondary | ICD-10-CM | POA: Diagnosis not present

## 2020-07-17 DIAGNOSIS — I4892 Unspecified atrial flutter: Secondary | ICD-10-CM | POA: Diagnosis present

## 2020-07-17 DIAGNOSIS — Z7984 Long term (current) use of oral hypoglycemic drugs: Secondary | ICD-10-CM | POA: Diagnosis not present

## 2020-07-17 DIAGNOSIS — U071 COVID-19: Secondary | ICD-10-CM | POA: Diagnosis present

## 2020-07-17 DIAGNOSIS — R0789 Other chest pain: Secondary | ICD-10-CM | POA: Diagnosis not present

## 2020-07-17 DIAGNOSIS — K219 Gastro-esophageal reflux disease without esophagitis: Secondary | ICD-10-CM | POA: Diagnosis present

## 2020-07-17 DIAGNOSIS — Z8249 Family history of ischemic heart disease and other diseases of the circulatory system: Secondary | ICD-10-CM | POA: Diagnosis not present

## 2020-07-17 DIAGNOSIS — E7800 Pure hypercholesterolemia, unspecified: Secondary | ICD-10-CM | POA: Diagnosis not present

## 2020-07-17 DIAGNOSIS — Z930 Tracheostomy status: Secondary | ICD-10-CM | POA: Diagnosis not present

## 2020-07-17 DIAGNOSIS — I495 Sick sinus syndrome: Secondary | ICD-10-CM | POA: Diagnosis present

## 2020-07-17 DIAGNOSIS — E039 Hypothyroidism, unspecified: Secondary | ICD-10-CM | POA: Diagnosis present

## 2020-07-17 DIAGNOSIS — E119 Type 2 diabetes mellitus without complications: Secondary | ICD-10-CM | POA: Diagnosis present

## 2020-07-17 DIAGNOSIS — R1310 Dysphagia, unspecified: Secondary | ICD-10-CM | POA: Diagnosis present

## 2020-07-17 DIAGNOSIS — I10 Essential (primary) hypertension: Secondary | ICD-10-CM | POA: Diagnosis present

## 2020-07-17 DIAGNOSIS — Z79899 Other long term (current) drug therapy: Secondary | ICD-10-CM | POA: Diagnosis not present

## 2020-07-17 DIAGNOSIS — Z8673 Personal history of transient ischemic attack (TIA), and cerebral infarction without residual deficits: Secondary | ICD-10-CM | POA: Diagnosis not present

## 2020-07-17 DIAGNOSIS — Z7901 Long term (current) use of anticoagulants: Secondary | ICD-10-CM | POA: Diagnosis not present

## 2020-07-17 DIAGNOSIS — Z79890 Hormone replacement therapy (postmenopausal): Secondary | ICD-10-CM | POA: Diagnosis not present

## 2020-07-17 DIAGNOSIS — J386 Stenosis of larynx: Secondary | ICD-10-CM | POA: Diagnosis present

## 2020-07-17 DIAGNOSIS — R001 Bradycardia, unspecified: Secondary | ICD-10-CM | POA: Diagnosis present

## 2020-07-17 DIAGNOSIS — R42 Dizziness and giddiness: Secondary | ICD-10-CM | POA: Diagnosis not present

## 2020-07-17 LAB — BASIC METABOLIC PANEL
Anion gap: 9 (ref 5–15)
BUN: 8 mg/dL (ref 8–23)
CO2: 23 mmol/L (ref 22–32)
Calcium: 8.7 mg/dL — ABNORMAL LOW (ref 8.9–10.3)
Chloride: 101 mmol/L (ref 98–111)
Creatinine, Ser: 0.94 mg/dL (ref 0.44–1.00)
GFR, Estimated: >60 mL/min (ref >60)
Glucose, Bld: 99 mg/dL (ref 70–99)
Potassium: 4.1 mmol/L (ref 3.5–5.1)
Sodium: 133 mmol/L — ABNORMAL LOW (ref 135–145)

## 2020-07-17 LAB — CBC
HCT: 35 % — ABNORMAL LOW (ref 36.0–46.0)
Hemoglobin: 11.7 g/dL — ABNORMAL LOW (ref 12.0–15.0)
MCH: 29.3 pg (ref 26.0–34.0)
MCHC: 33.4 g/dL (ref 30.0–36.0)
MCV: 87.5 fL (ref 80.0–100.0)
Platelets: 348 10*3/uL (ref 150–400)
RBC: 4 MIL/uL (ref 3.87–5.11)
RDW: 16 % — ABNORMAL HIGH (ref 11.5–15.5)
WBC: 5.4 10*3/uL (ref 4.0–10.5)
nRBC: 0 % (ref 0.0–0.2)

## 2020-07-17 LAB — GLUCOSE, CAPILLARY: Glucose-Capillary: 99 mg/dL (ref 70–99)

## 2020-07-17 LAB — CBG MONITORING, ED
Glucose-Capillary: 126 mg/dL — ABNORMAL HIGH (ref 70–99)
Glucose-Capillary: 98 mg/dL (ref 70–99)
Glucose-Capillary: 113 mg/dL — ABNORMAL HIGH (ref 70–99)

## 2020-07-17 LAB — MAGNESIUM
Magnesium: 1.9 mg/dL (ref 1.7–2.4)
Magnesium: 2 mg/dL (ref 1.7–2.4)

## 2020-07-17 LAB — MRSA PCR SCREENING: MRSA by PCR: NEGATIVE

## 2020-07-17 MED ORDER — AMIODARONE HCL 200 MG PO TABS
200.0000 mg | ORAL_TABLET | Freq: Every day | ORAL | Status: DC
  Administered 2020-07-18: 200 mg via ORAL
  Filled 2020-07-17: qty 1

## 2020-07-17 NOTE — ED Notes (Signed)
Checked patient cbg it was 73 notified RN paige of blood sugar patient is resting with call bell in reach

## 2020-07-17 NOTE — Plan of Care (Signed)

## 2020-07-17 NOTE — ED Notes (Signed)
Pt ambulated to restroom in room.

## 2020-07-17 NOTE — Progress Notes (Signed)
Progress Note  Patient Name: Jamie Mitchell Date of Encounter: 07/17/2020  [facility] Cardiologist: Donato Heinz, MD   Subjective   Denies any dyspnea.  Reports feels tired.  Has continued to have chest pain.  Inpatient Medications    Scheduled Meds: . apixaban  5 mg Oral BID  . atorvastatin  40 mg Oral QPM  . insulin aspart  0-15 Units Subcutaneous TID WC  . levothyroxine  75 mcg Oral QAC breakfast  . pantoprazole  40 mg Oral BID AC   Continuous Infusions:  PRN Meds: acetaminophen, ondansetron (ZOFRAN) IV   Vital Signs    Vitals:   07/17/20 0700 07/17/20 0800 07/17/20 1041 07/17/20 1100  BP: 121/70 112/86 110/79 104/64  Pulse: 61 (!) 55 (!) 54 (!) 53  Resp: 16 17 (!) 27 17  Temp:      TempSrc:      SpO2: 97% 93% 96% 96%   No intake or output data in the 24 hours ending 07/17/20 1122 Last 3 Weights 07/10/2020 03/27/2020 03/19/2020  Weight (lbs) 129 lb 135 lb 2 oz 136 lb  Weight (kg) 58.514 kg 61.292 kg 61.689 kg      Telemetry    Sinus bradycardia in 30-40s - Personally Reviewed  ECG    NSR, rate 55 - Personally Reviewed  Physical Exam   GEN: No acute distress.   Neck: No JVD Cardiac: regular, bradycardic, no murmurs, rubs, or gallops.  Respiratory: Clear to auscultation bilaterally. GI: Soft, nontender, non-distended  MS: No edema; No deformity. Neuro:  Nonfocal  Psych: Normal affect   Labs    High Sensitivity Troponin:   Recent Labs  Lab 07/15/20 2031 07/15/20 2206  TROPONINIHS 4 5      Chemistry Recent Labs  Lab 07/15/20 2031 07/17/20 0333  NA 133* 133*  K 4.2 4.1  CL 100 101  CO2 23 23  GLUCOSE 103* 99  BUN 5* 8  CREATININE 0.99 0.94  CALCIUM 9.1 8.7*  GFRNONAA >60 >60  ANIONGAP 10 9     Hematology Recent Labs  Lab 07/15/20 2031 07/17/20 0333  WBC 5.1 5.4  RBC 4.32 4.00  HGB 12.9 11.7*  HCT 37.6 35.0*  MCV 87.0 87.5  MCH 29.9 29.3  MCHC 34.3 33.4  RDW 15.9* 16.0*  PLT 389 348    BNPNo  results for input(s): BNP, PROBNP in the last 168 hours.   DDimer No results for input(s): DDIMER in the last 168 hours.   Radiology    DG Chest 2 View  Result Date: 07/15/2020 CLINICAL DATA:  75 year old female with chest pain. EXAM: CHEST - 2 VIEW COMPARISON:  Chest radiograph dated 12/23/2019. FINDINGS: Tracheostomy above the carina. No focal consolidation, pleural effusion or pneumothorax. Stable cardiac silhouette. No acute osseous pathology. IMPRESSION: No active cardiopulmonary disease. Electronically Signed   By: Anner Crete M.D.   On: 07/15/2020 20:43    Cardiac Studies   Monitor 08/2019   Numerous sinus pauses, longest lasting 3.3 seconds (occured at 1:53pm)  14 days of data recorded on Zio monitor. Patient had a min HR of 23 bpm, max HR of 136 bpm, and avg HR of 45 bpm. Predominant underlying rhythm was Sinus Rhythm. No VT, atrial fibrillation, or high degree block noted. 8 runs of SVT occurred, longest lasting 7 beats. 101 pauses occurred, longest lasting 3.3 seconds. Isolated atrial and ventricular ectopy was rare (<1%). There were 0 triggered events.   Echo 06/2019 1. Left ventricular ejection fraction, by  estimation, is 60 to 65%. The  left ventricle has normal function. The left ventricle has no regional  wall motion abnormalities. Left ventricular diastolic parameters were  normal.  2. Right ventricular systolic function is normal. The right ventricular  size is normal. Tricuspid regurgitation signal is inadequate for assessing  PA pressure.  3. The mitral valve is normal in structure and function. Mild mitral  valve regurgitation. No evidence of mitral stenosis.  4. The aortic valve is normal in structure and function. Aortic valve  regurgitation is not visualized. No aortic stenosis is present.  5. The inferior vena cava is normal in size with greater than 50%  respiratory variability, suggesting right atrial pressure of 3 mmHg.   Patient Profile      75 y.o. 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 Outpatient monitor showed multiple sinus pauses, longest lasting 3.3 seconds.  Previously recommended pacemaker but delayed secondary to tracheostomy.  She was asymptomatic when seen by Dr. Lovena Le last time September 2021 and watchful waiting recommended.  Presented with bradycardia on admission.  Her son reports she was having lightheadedness/fatigue and heart rate was in low 30s when he checked at home.  She was on amiodarone 200 mg daily which is held.  -Continue to hold amiodarone -HR remains in 30s this morning, will discuss with EP  Paroxysmal atrial fibrillation/flutter: Maintaining sinus rhythm.  Amiodarone held due to bradycardia.  On eliquis.  COVID 19: Initially diagnosed June 24, 2020 after family member tested positive.  COVID positive here, patient is essentially asymptomatic -Per primary team  Chest pain: Troponin negative despite continuous chest pain, suspect noncardiac  DM/ Vomiting: Seen by GI 07/10/20 for vomiting and weight loss. Stopped Metformin to see response.  - Per primary team   For questions or updates, please contact [place] Please consult [email or web address] for contact info under        Signed, Donato Heinz, MD  07/17/2020, 11:22 AM

## 2020-07-17 NOTE — ED Notes (Signed)
Tele Breakfast Order Placed

## 2020-07-17 NOTE — Consult Note (Addendum)
ELECTROPHYSIOLOGY CONSULT NOTE    Patient ID: Jamie Mitchell MRN: [id number], DOB/AGE: 1946/02/19 75 y.o.  Admit date: 07/15/2020 Date of Consult: 07/17/2020  Primary Physician: Charlott Rakes, MD Primary Cardiologist: Donato Heinz, MD  Electrophysiologist: Dr. Lovena Le  Referring Provider: Dr. Gardiner Rhyme  Patient Profile: Jamie Mitchell is a 75 y.o. 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 Gardiner Rhyme.  HPI:  Jamie Mitchell is a 75 y.o. female with medical history as above. Previously seen by Dr. Lovena Le after monitor revealed sinus bradycardia as low as the 20s while awake and pauses of just under 4 seconds. At one point had planned on pacing, but this was ultimately deferred due to tracheostomy (as above) and she was relatively asymptomatic.   She presented yesterday evening for evaluation of brief lightheadedness over the weekend. Her son checked HR by BP cuff and pulse ox and got 30-50s. She felt slightly fatigued while walking around.  Of note, she has continued to have difficulty swallowing and fatigue with eating, and medications recently adjusted.   EKG on arrival shows bradycardia in 50s.  Pertinent labs include HS trop 4 -> 5, Mg 2.0, WBC 5.4, Hgb 11.7, Cr 0.94, K 4.1  Unfortunately, she was moved within the ED without being admitted, so her initial tele is lost. Only available tele is since approx 1245 this afternoon.  She initially had reports of a pain in her chest. She states this is a very short, small pain localized to a point on her left chest, and has previously been associated with eating. Denies any exertional component.   She is currently feeling well. Her son is present who translates. She  only felt somewhat tired and dizzy this weekend. This was noted with HRs in the "30s" and in the "50s", no clear correlation. She denies syncope or near syncope.  She has issues with reflux, up to and including vomiting at times, and follows with Dr. Henrene Pastor. Metformin was recently stopped to see if this was worsening her symptoms (last week).   Past Medical History:  Diagnosis Date  . Atrial fibrillation ([place])   . 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 DM (diabetes mellitus) ([place])   . HLD (hyperlipidemia)   . Hypertension   . Hypothyroidism   . Renal failure 07/25/2019  . Stroke [facility])      Surgical History:  Past Surgical History:  Procedure Laterality Date  . IR CT HEAD LTD  07/20/2019  . IR PERCUTANEOUS ART THROMBECTOMY/INFUSION INTRACRANIAL INC DIAG ANGIO  07/20/2019  . KNEE SURGERY Left   . RADIOLOGY WITH ANESTHESIA N/A 07/20/2019   Procedure: IR WITH ANESTHESIA;  Surgeon: Luanne Bras, MD;  Location: [facility];  Service: Radiology;  Laterality: N/A;  . TRACHEOSTOMY TUBE PLACEMENT N/A 12/27/2019   Procedure: TRACHEOSTOMY;  Surgeon: Izora Gala, MD;  Location: [facility];  Service: ENT;  Laterality: N/A;     (Not in a hospital admission)   Inpatient Medications:  . apixaban  5 mg Oral BID  . atorvastatin  40 mg Oral QPM  . insulin aspart  0-15 Units Subcutaneous TID WC  . levothyroxine  75 mcg Oral QAC breakfast  . pantoprazole  40 mg Oral BID AC    Allergies: No Known Allergies  Social History   Socioeconomic History  .  Marital status: Married    Spouse name: Not on file  . Number of children: 11  . Years of education: Not on file  . Highest education level: Not on file  Occupational History  . Not on file  Tobacco Use  . Smoking status: Never Smoker  . Smokeless tobacco: Never Used  Vaping Use  . Vaping Use: Never used  Substance and Sexual Activity  . Alcohol use: No  . Drug use: No  . Sexual activity: Not Currently    Birth control/protection: None    Comment:  Married  Other Topics Concern  . Not on file  Social History Narrative   Lives with son [name])    Social Determinants of Health   Financial Resource Strain: Not on file  Food Insecurity: Not on file  Transportation Needs: No Transportation Needs  . Lack of Transportation (Medical): No  . Lack of Transportation (Non-Medical): No  Physical Activity: Not on file  Stress: Not on file  Social Connections: Not on file  Intimate Partner Violence: Not on file     Family History  Problem Relation Age of Onset  . Birth defects Daughter        feet  . Heart disease Sister      Review of Systems: All other systems reviewed and are otherwise negative except as noted above.  Physical Exam: Vitals:   07/17/20 1041 07/17/20 1100 07/17/20 1146 07/17/20 1315  BP: 110/79 104/64  (!) 119/98  Pulse: (!) 54 (!) 53 (!) 44 (!) 53  Resp: (!) 27 17 (!) 23 17  Temp:    98.3 F (36.8 C)  TempSrc:    Oral  SpO2: 96% 96% 99% 97%    GEN- The patient is well appearing, alert and oriented x 3 today.   HEENT: normocephalic, atraumatic; sclera clear, conjunctiva pink; hearing intact; oropharynx clear; neck supple Lungs- Clear to ausculation bilaterally, normal work of breathing.  No wheezes, rales, rhonchi Heart- Regular rate and rhythm, no murmurs, rubs or gallops GI- soft, non-tender, non-distended, bowel sounds present Extremities- no clubbing, cyanosis, or edema; DP/PT/radial pulses 2+ bilaterally MS- no significant deformity or atrophy Skin- warm and dry, no rash or lesion Psych- euthymic mood, full affect Neuro- strength and sensation are intact  Labs:   Lab Results  Component Value Date   WBC 5.4 07/17/2020   HGB 11.7 (L) 07/17/2020   HCT 35.0 (L) 07/17/2020   MCV 87.5 07/17/2020   PLT 348 07/17/2020    Recent Labs  Lab 07/17/20 0333  NA 133*  K 4.1  CL 101  CO2 23  BUN 8  CREATININE 0.94  CALCIUM 8.7*  GLUCOSE 99      Radiology/Studies: DG Chest 2 View  Result Date:  07/15/2020 CLINICAL DATA:  75 year old female with chest pain. EXAM: CHEST - 2 VIEW COMPARISON:  Chest radiograph dated 12/23/2019. FINDINGS: Tracheostomy above the carina. No focal consolidation, pleural effusion or pneumothorax. Stable cardiac silhouette. No acute osseous pathology. IMPRESSION: No active cardiopulmonary disease. Electronically Signed   By: Anner Crete M.D.   On: 07/15/2020 20:43    EKG: On arrival shows Sinus bradycardia in 50s.  EKG from 2/21 shows SB in the 50s, with occasional pauses related to ectopy.  (personally reviewed)  TELEMETRY: As above, currently the only telemetry available for review is since 1245 this afternoon, as patient was moved around in ED, and her previous room now has a different patient attached to the tele. Available data shows sinus brady  in the 50s without ectopy or pause.    I was able to review her tele from overnight earlier, as well as discuss it with Dr. Gardiner Rhyme. This showed occasional pauses which did not exceed 2.2 seconds, and were not associated with symptoms. (personally reviewed)  Assessment/Plan: 1.  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 No clear correlation with her symptoms.  She also has increased vagal tone in the setting of trach, which could certainly exacerbate this condition.  Would continue amiodarone 200 mg daily as her atrial fibrillation is currently well controlled and Emree Locicero remain in her system for quite sometime.  Avoid any other AV blockade (apart from amio as above)  2. Nonspecific GI complaints Recent upper GI series without obstruction.  Her "chest pain" is at a specific point on her left chest, not related to exertion, and has been related to meals in the past. HS-troponin negative.  She has signs of fatigue while eating per her son, with recent history of weight loss and reflux including vomiting at times.  Follows with GI.  Increase vagal tone in this setting could certainly  be aggravating her existing SND.   3. Subglotic stenosis s/p trach In addition to above, she is due for trach change out next month. Preferably pacing (if needed) would occur AFTER this, to further reduce risk of infection.  Dr. Curt Bears has seen, and I have re-discussed with Dr. Gardiner Rhyme. No clear correlation with her current symptoms and her chronic SND. Would continue her current medications. We have made close follow up with Dr. Lovena Le to further discuss the possibility of pacing down the road. She remains at a higher than average risk of infection given Trach.   For questions or updates, please contact [place] Please consult [email or web address] for contact info under Cardiology/STEMI.  Signed, Shirley Friar, PA-C  07/17/2020 1:39 PM  I have seen and examined this patient with Oda Kilts.  Agree with above, note added to reflect my findings.  On exam, RRR, no murmurs.  Patient presented to the emergency room after having episodes of dizziness over the weekend.  Apparently on telemetry, she had sinus pauses.  Her sinus pauses of reportedly less than 3 seconds as well as bradycardia.  She does have a history of sinus pauses which has been followed as an outpatient for the last year or so.  She has a history of atrial fibrillation and is on amiodarone.  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  This would make pacemaker implant at high risk of infection.  Would hold off on pacemaker implant for now.  Would continue telemetry while she is admitted.  Beverly Ferner check telemetry tomorrow.  Arwyn Besaw M. Shammond Arave MD 07/17/2020 4:49 PM

## 2020-07-17 NOTE — ED Notes (Signed)
RN attempted report x1.  

## 2020-07-17 NOTE — ED Notes (Signed)
Son at bedside.

## 2020-07-17 NOTE — Progress Notes (Signed)
PROGRESS NOTE        PATIENT DETAILS Name: Jamie Mitchell Age: 75 y.o. Sex: female Date of Birth: [birth year] Admit Date: 07/15/2020 Admitting Physician Karmen Bongo, MD OIZ:[id number], Charlane Ferretti, MD  Brief Narrative: Patient is a 75 y.o. female tracheostomy (due to recurrent stridor, A. fib on Eliquis, hypothyroidism, HTN, DM, CVA-presenting with bradycardia.  Admitted to the hospitalist service for further evaluation and treatment.  Significant events: 1/31>> tested positive for COVID-19 2/21>> admit for evaluation of bradycardia  Significant studies: 2/21>> chest x-ray: No pneumonia 1/25>> TSH: 0.5  Antimicrobial therapy: None  Microbiology data: None  Procedures : None  Consults: Cardiology  DVT Prophylaxis : apixaban (ELIQUIS) tablet 5 mg   Subjective: Eating breakfast-no chest pain or shortness of breath.  Assessment/Plan: Sinus bradycardia: Amiodarone on hold-heart rate in the 30s this morning-cardiology following with plans to consult EP today.  TSH within normal limits.  Chest pain: Noncardiac chest pain suspected-troponins negative.  Recent Covid infection: Asymptomatic-diagnosed on 1/31-no need for any further isolation.  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 Yes; Fluid consistency: Thin  Diet effective now                  Code Status: Full code   Family  Communication: None at bedside-we will update family in the next day or so once EP evaluation obtained.  Disposition Plan: Status is: Observation  The patient will require care spanning > 2 midnights and should be moved to inpatient because: Inpatient level of care appropriate due to severity of illness  Dispo: The patient is from: Home              Anticipated d/c is to: Home              Anticipated d/c date is: 1 day              Patient currently is not medically stable to d/c.   Difficult to place patient No   Barriers to Discharge: Bradycardia-heart rate in the 30s this morning-awaiting EP evaluation  Antimicrobial agents: Anti-infectives (From admission, onward)   None       Time spent: 25 minutes-Greater than 50% of this time was spent in counseling, explanation of diagnosis, planning of further management, and coordination of care.  MEDICATIONS: Scheduled Meds: . apixaban  5 mg Oral BID  . atorvastatin  40 mg Oral QPM  . insulin aspart  0-15 Units Subcutaneous TID WC  . levothyroxine  75 mcg Oral QAC breakfast  . pantoprazole  40 mg Oral BID AC   Continuous Infusions: PRN Meds:.acetaminophen, ondansetron (ZOFRAN) IV   PHYSICAL EXAM: Vital signs: Vitals:   07/17/20 1041 07/17/20 1100 07/17/20 1146 07/17/20 1315  BP: 110/79 104/64  (!) 119/98  Pulse: (!) 54 (!) 53 (!) 44 [name])  53  Resp: (!) 27 17 (!) 23 17  Temp:    98.3 F (36.8 C)  TempSrc:    Oral  SpO2: 96% 96% 99% 97%   There were no vitals filed for this visit. There is no height or weight on file to calculate BMI.   Gen Exam:Alert awake-not in any distress HEENT:atraumatic, normocephalic Chest: B/L clear to auscultation anteriorly CVS:S1S2 regular Abdomen:soft non tender, non distended Extremities:no edema Neurology: Non focal Skin: no rash  I have personally reviewed following labs and imaging studies  LABORATORY DATA: CBC: Recent Labs  Lab 07/15/20 2031 07/17/20 0333  WBC 5.1 5.4   HGB 12.9 11.7*  HCT 37.6 35.0*  MCV 87.0 87.5  PLT 389 761    Basic Metabolic Panel: Recent Labs  Lab 07/15/20 2031 07/17/20 0333  NA 133* 133*  K 4.2 4.1  CL 100 101  CO2 23 23  GLUCOSE 103* 99  BUN 5* 8  CREATININE 0.99 0.94  CALCIUM 9.1 8.7*  MG  --  1.9  2.0    GFR: Estimated Creatinine Clearance: 41.6 mL/min (by C-G formula based on SCr of 0.94 mg/dL).  Liver Function Tests: No results for input(s): AST, ALT, ALKPHOS, BILITOT, PROT, ALBUMIN in the last 168 hours. No results for input(s): LIPASE, AMYLASE in the last 168 hours. No results for input(s): AMMONIA in the last 168 hours.  Coagulation Profile: No results for input(s): INR, PROTIME in the last 168 hours.  Cardiac Enzymes: No results for input(s): CKTOTAL, CKMB, CKMBINDEX, TROPONINI in the last 168 hours.  BNP (last 3 results) No results for input(s): PROBNP in the last 8760 hours.  Lipid Profile: No results for input(s): CHOL, HDL, LDLCALC, TRIG, CHOLHDL, LDLDIRECT in the last 72 hours.  Thyroid Function Tests: No results for input(s): TSH, T4TOTAL, FREET4, T3FREE, THYROIDAB in the last 72 hours.  Anemia Panel: No results for input(s): VITAMINB12, FOLATE, FERRITIN, TIBC, IRON, RETICCTPCT in the last 72 hours.  Urine analysis:    Component Value Date/Time   COLORURINE YELLOW 12/21/2019 1800   APPEARANCEUR CLEAR 12/21/2019 1800   LABSPEC 1.010 12/21/2019 1800   PHURINE 7.0 12/21/2019 1800   GLUCOSEU >=500 (A) 12/21/2019 1800   HGBUR NEGATIVE 12/21/2019 1800   BILIRUBINUR NEGATIVE 12/21/2019 1800   KETONESUR NEGATIVE 12/21/2019 1800   PROTEINUR 30 (A) 12/21/2019 1800   NITRITE NEGATIVE 12/21/2019 1800   LEUKOCYTESUR NEGATIVE 12/21/2019 1800    Sepsis Labs: Lactic Acid, Venous    Component Value Date/Time   LATICACIDVEN 2.5 ([place]) 12/21/2019 2003    MICROBIOLOGY: Recent Results (from the past 240 hour(s))  Resp Panel by RT-PCR (Flu A&B, Covid) Nasopharyngeal Swab     Status: Abnormal    Collection Time: 07/16/20  4:30 AM   Specimen: Nasopharyngeal Swab; Nasopharyngeal(NP) swabs in vial transport medium  Result Value Ref Range Status   SARS Coronavirus 2 by RT PCR POSITIVE (A) NEGATIVE Final    Comment: RESULT CALLED TO, READ BACK BY AND VERIFIED WITH: J LYONS RN 07/16/20 0529 JDW (NOTE) SARS-CoV-2 target nucleic acids are DETECTED.  The SARS-CoV-2 RNA is generally detectable in upper respiratory specimens during the acute phase of infection. Positive results are indicative of the presence of the identified virus, but do not rule out bacterial infection or co-infection with other pathogens not detected by the test. Clinical correlation with patient history and other diagnostic information is necessary to determine patient infection status. The expected result is Negative.  Fact Sheet for Patients: EntrepreneurPulse.com.au  Fact Sheet  for Healthcare Providers: IncredibleEmployment.be  This test is not yet approved or cleared by the [place] and  has been authorized for detection and/or diagnosis of SARS-CoV-2 by FDA under an Emergency Use Authorization (EUA).  This EUA will remain in effect (meaning this test can be used)  for the duration of  the COVID-19 declaration under Section 564(b)(1) of the Act, 21 U.S.C. section 360bbb-3(b)(1), unless the authorization is terminated or revoked sooner.     Influenza A by PCR NEGATIVE NEGATIVE Final   Influenza B by PCR NEGATIVE NEGATIVE Final    Comment: (NOTE) The Xpert Xpress SARS-CoV-2/FLU/RSV plus assay is intended as an aid in the diagnosis of influenza from Nasopharyngeal swab specimens and should not be used as a sole basis for treatment. Nasal washings and aspirates are unacceptable for Xpert Xpress SARS-CoV-2/FLU/RSV testing.  Fact Sheet for Patients: EntrepreneurPulse.com.au  Fact Sheet for Healthcare  Providers: IncredibleEmployment.be  This test is not yet approved or cleared by the [place] FDA and has been authorized for detection and/or diagnosis of SARS-CoV-2 by FDA under an Emergency Use Authorization (EUA). This EUA will remain in effect (meaning this test can be used) for the duration of the COVID-19 declaration under Section 564(b)(1) of the Act, 21 U.S.C. section 360bbb-3(b)(1), unless the authorization is terminated or revoked.  Performed at [facility], [place] [address], [place], [place] [address]     RADIOLOGY STUDIES/RESULTS: DG Chest 2 View  Result Date: 07/15/2020 CLINICAL DATA:  75 year old female with chest pain. EXAM: CHEST - 2 VIEW COMPARISON:  Chest radiograph dated 12/23/2019. FINDINGS: Tracheostomy above the carina. No focal consolidation, pleural effusion or pneumothorax. Stable cardiac silhouette. No acute osseous pathology. IMPRESSION: No active cardiopulmonary disease. Electronically Signed   By: Anner Crete M.D.   On: 07/15/2020 20:43     LOS: 0 days   Oren Binet, MD  [facility]    To contact the attending provider between 7A-7P or the covering provider during after hours 7P-7A, please log into the web site [email or web address] and access using universal [place] password for that web site. If you do not have the password, please call the hospital operator.  07/17/2020, 1:42 PM

## 2020-07-17 NOTE — ED Notes (Signed)
RN attempted report x2 

## 2020-07-18 ENCOUNTER — Other Ambulatory Visit: Payer: Self-pay | Admitting: Family Medicine

## 2020-07-18 DIAGNOSIS — E7800 Pure hypercholesterolemia, unspecified: Secondary | ICD-10-CM | POA: Diagnosis not present

## 2020-07-18 DIAGNOSIS — E038 Other specified hypothyroidism: Secondary | ICD-10-CM | POA: Diagnosis not present

## 2020-07-18 DIAGNOSIS — I480 Paroxysmal atrial fibrillation: Secondary | ICD-10-CM | POA: Diagnosis not present

## 2020-07-18 DIAGNOSIS — R001 Bradycardia, unspecified: Secondary | ICD-10-CM | POA: Diagnosis not present

## 2020-07-18 LAB — GLUCOSE, CAPILLARY
Glucose-Capillary: 92 mg/dL (ref 70–99)
Glucose-Capillary: 102 mg/dL — ABNORMAL HIGH (ref 70–99)

## 2020-07-18 NOTE — Plan of Care (Signed)
  Problem: Clinical Measurements: Goal: Cardiovascular complication will be avoided Outcome: Progressing   

## 2020-07-18 NOTE — Discharge Summary (Signed)
PATIENT DETAILS Name: Jamie Mitchell Age: 75 y.o. Sex: female Date of Birth: [birth year] MRN: [id number]. Admitting Physician: Jonetta Osgood, MD GGE:[id number], Charlane Ferretti, MD  Admit Date: 07/15/2020 Discharge date: 07/18/2020  Recommendations for Outpatient Follow-up:  1. Follow up with PCP in 1-2 weeks 2. Please obtain CMP/CBC in one week 3. Ensure follow-up with cardiology/EP.  Admitted From:  Home  Disposition: [place]: No  Equipment/Devices: None  Discharge Condition: Stable  CODE STATUS: FULL CODE  Diet recommendation:  Diet Order            Diet - low sodium heart healthy           Diet Carb Modified           Diet heart healthy/carb modified Room service appropriate? Yes; Fluid consistency: Thin  Diet effective now                  Brief Narrative: Patient is a 75 y.o. female tracheostomy (due to recurrent stridor, A. fib on Eliquis, hypothyroidism, HTN, DM, CVA-presenting with bradycardia.  Admitted to the hospitalist service for further evaluation and treatment.  Significant events: 1/31>> tested positive for COVID-19 2/21>> admit for evaluation of bradycardia  Significant studies: 2/21>> chest x-ray: No pneumonia 1/25>> TSH: 0.5  Antimicrobial therapy: None  Microbiology data: None  Procedures : None  Consults: Cardiology  DVT Prophylaxis : apixaban (ELIQUIS) tablet 5 mg   Brief Hospital Course: Sinus bradycardia: Evaluated by cardiology and EP-felt not to be symptomatic-recommendations are for outpatient follow-up with EP for potential pacemaker placement at some point in time.  Per EP-okay to resume amiodarone.  TSH within normal limits.    Chest pain: Noncardiac chest pain suspected-troponins negative.  Recent Covid infection: Asymptomatic-diagnosed on 1/31-no need for any further isolation.  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  Continue Eliquis.  History of CVA: No focal deficits on exam-on Eliquis, statin  HLD: Continue statin  DM-2 (A1c 5.9 on 2/22): CBG stable-follow with PCP for further continued care.  Discharge Diagnoses:  Principal Problem:   Symptomatic bradycardia Active Problems:   Hypothyroidism   Hypertension   Hyperlipidemia   Type 2 diabetes mellitus (HCC)   Tracheostomy dependence (HCC)   Chest pain   Paroxysmal atrial fibrillation (HCC)   Sinus bradycardia   Discharge Instructions:  Activity:  As tolerated  Discharge Instructions    Call MD for:  difficulty breathing, headache or visual disturbances   Complete by: As directed    Call MD for:  persistant dizziness or light-headedness   Complete by: As directed    Diet - low sodium heart healthy   Complete by: As directed    Diet Carb Modified   Complete by: As directed    Discharge instructions   Complete by: As directed    Follow with Primary MD  Charlott Rakes, MD in 1-2 weeks  Follow with cardiology/EP team as instructed.  Please get a complete blood count and chemistry panel checked by your Primary MD at your next visit, and again as instructed by your Primary MD.  Get Medicines reviewed and adjusted: Please take all your medications with you for your next visit with your Primary MD  Laboratory/radiological data: Please request your Primary MD to go over all hospital tests and procedure/radiological  results at the follow up, please ask your Primary MD to get all Hospital records sent to his/her office.  In some cases, they will be blood work, cultures and biopsy results pending at the time of your discharge. Please request that your primary care M.D. follows up on these results.  Also Note the following: If you  experience worsening of your admission symptoms, develop shortness of breath, life threatening emergency, suicidal or homicidal thoughts you must seek medical attention immediately by calling 911 or calling your MD immediately  if symptoms less severe.  You must read complete instructions/literature along with all the possible adverse reactions/side effects for all the Medicines you take and that have been prescribed to you. Take any new Medicines after you have completely understood and accpet all the possible adverse reactions/side effects.   Do not drive when taking Pain medications or sleeping medications (Benzodaizepines)  Do not take more than prescribed Pain, Sleep and Anxiety Medications. It is not advisable to combine anxiety,sleep and pain medications without talking with your primary care practitioner  Special Instructions: If you have smoked or chewed Tobacco  in the last 2 yrs please stop smoking, stop any regular Alcohol  and or any Recreational drug use.  Wear Seat belts while driving.  Please note: You were cared for by a hospitalist during your hospital stay. Once you are discharged, your primary care physician will handle any further medical issues. Please note that NO REFILLS for any discharge medications will be authorized once you are discharged, as it is imperative that you return to your primary care physician (or establish a relationship with a primary care physician if you do not have one) for your post hospital discharge needs so that they can reassess your need for medications and monitor your lab values.   Increase activity slowly   Complete by: As directed      Allergies as of 07/18/2020   No Known Allergies     Medication List    TAKE these medications   amiodarone 200 MG tablet Commonly known as: PACERONE Take 1 tablet (200 mg total) by mouth daily.   atorvastatin 40 MG tablet Commonly known as: LIPITOR Take 1 tablet (40 mg total) by mouth every evening.    diclofenac Sodium 1 % Gel Commonly known as: Voltaren Apply 2 g topically 4 (four) times daily as needed (shoulder pain).   Eliquis 5 MG Tabs tablet Generic drug: apixaban Take 1 tablet (5 mg total) by mouth 2 (two) times daily.   EPINEPHrine 0.3 mg/0.3 mL Soaj injection Commonly known as: EPI-PEN Inject 0.3 mLs (0.3 mg total) into the muscle as needed for anaphylaxis.   levothyroxine 75 MCG tablet Commonly known as: SYNTHROID Take 1 tablet (75 mcg total) by mouth daily before breakfast.   metFORMIN 500 MG tablet Commonly known as: GLUCOPHAGE Take 2 tablets (1,000 mg total) by mouth 2 (two) times daily with a meal.   NYQUIL PO Take 1 Dose by mouth at bedtime as needed (cough).   ondansetron 4 MG tablet Commonly known as: ZOFRAN Take 1 tablet (4 mg total) by mouth 3 (three) times daily as needed for nausea or vomiting.   pantoprazole 40 MG tablet Commonly known as: PROTONIX Take 1 tablet (40 mg total) by mouth 2 (two) times daily before a meal.       Follow-up Information    Evans Lance, MD Follow up.   Specialty: Cardiology Why: on 3/17 at 330 pm for post hospital follow up  Contact information: [address] [address] [phone]              No Known Allergies  Other Procedures/Studies: DG Chest 2 View  Result Date: 07/15/2020 CLINICAL DATA:  75 year old female with chest pain. EXAM: CHEST - 2 VIEW COMPARISON:  Chest radiograph dated 12/23/2019. FINDINGS: Tracheostomy above the carina. No focal consolidation, pleural effusion or pneumothorax. Stable cardiac silhouette. No acute osseous pathology. IMPRESSION: No active cardiopulmonary disease. Electronically Signed   By: Anner Crete M.D.   On: 07/15/2020 20:43     TODAY-DAY OF DISCHARGE:  Subjective:   Verania Esquivel Venegas today has no headache,no chest abdominal pain,no new weakness tingling or numbness, feels much better wants to go home today.   Objective:    Blood pressure 98/63, pulse (!) 57, temperature 97.6 F (36.4 C), temperature source Oral, resp. rate 19, SpO2 95 %.  Intake/Output Summary (Last 24 hours) at 07/18/2020 1026 Last data filed at 07/17/2020 1820 Gross per 24 hour  Intake 240 ml  Output -  Net 240 ml   There were no vitals filed for this visit.  Exam: Awake Alert, Oriented *3, No new F.N deficits, Normal affect [place].AT,PERRAL Supple Neck,No JVD, No cervical lymphadenopathy appriciated.  Symmetrical Chest wall movement, Good air movement bilaterally, CTAB RRR,No Gallops,Rubs or new Murmurs, No Parasternal Heave +ve B.Sounds, Abd Soft, Non tender, No organomegaly appriciated, No rebound -guarding or rigidity. No Cyanosis, Clubbing or edema, No new Rash or bruise   PERTINENT RADIOLOGIC STUDIES: No results found.   PERTINENT LAB RESULTS: CBC: Recent Labs    07/15/20 2031 07/17/20 0333  WBC 5.1 5.4  HGB 12.9 11.7*  HCT 37.6 35.0*  PLT 389 348   CMET CMP     Component Value Date/Time   NA 133 (L) 07/17/2020 0333   NA 140 06/18/2020 1104   K 4.1 07/17/2020 0333   CL 101 07/17/2020 0333   CO2 23 07/17/2020 0333   GLUCOSE 99 07/17/2020 0333   BUN 8 07/17/2020 0333   BUN 9 06/18/2020 1104   CREATININE 0.94 07/17/2020 0333   CREATININE 0.80 07/24/2015 0947   CALCIUM 8.7 (L) 07/17/2020 0333   PROT 6.7 06/18/2020 1104   ALBUMIN 4.1 06/18/2020 1104   AST 18 06/18/2020 1104   ALT 21 06/18/2020 1104   ALKPHOS 87 06/18/2020 1104   BILITOT 0.6 06/18/2020 1104   GFRNONAA >60 07/17/2020 0333   GFRNONAA 77 07/24/2015 0947   GFRAA 52 (L) 06/18/2020 1104   GFRAA 88 07/24/2015 0947    GFR Estimated Creatinine Clearance: 41.6 mL/min (by C-G formula based on SCr of 0.94 mg/dL). No results for input(s): LIPASE, AMYLASE in the last 72 hours. No results for input(s): CKTOTAL, CKMB, CKMBINDEX, TROPONINI in the last 72 hours. Invalid input(s): POCBNP No results for input(s): DDIMER in the last 72 hours. Recent  Labs    07/16/20 0906  HGBA1C 5.9*   No results for input(s): CHOL, HDL, LDLCALC, TRIG, CHOLHDL, LDLDIRECT in the last 72 hours. No results for input(s): TSH, T4TOTAL, T3FREE, THYROIDAB in the last 72 hours.  Invalid input(s): FREET3 No results for input(s): VITAMINB12, FOLATE, FERRITIN, TIBC, IRON, RETICCTPCT in the last 72 hours. Coags: No results for input(s): INR in the last 72 hours.  Invalid input(s): PT Microbiology: Recent Results (from the past 240 hour(s))  Resp Panel by RT-PCR (Flu A&B, Covid) Nasopharyngeal Swab     Status: Abnormal   Collection Time: 07/16/20  4:30 AM  Specimen: Nasopharyngeal Swab; Nasopharyngeal(NP) swabs in vial transport medium  Result Value Ref Range Status   SARS Coronavirus 2 by RT PCR POSITIVE (A) NEGATIVE Final    Comment: RESULT CALLED TO, READ BACK BY AND VERIFIED WITH: J LYONS RN 07/16/20 0529 JDW (NOTE) SARS-CoV-2 target nucleic acids are DETECTED.  The SARS-CoV-2 RNA is generally detectable in upper respiratory specimens during the acute phase of infection. Positive results are indicative of the presence of the identified virus, but do not rule out bacterial infection or co-infection with other pathogens not detected by the test. Clinical correlation with patient history and other diagnostic information is necessary to determine patient infection status. The expected result is Negative.  Fact Sheet for Patients: EntrepreneurPulse.com.au  Fact Sheet for Healthcare Providers: IncredibleEmployment.be  This test is not yet approved or cleared by the [place] FDA and  has been authorized for detection and/or diagnosis of SARS-CoV-2 by FDA under an Emergency Use Authorization (EUA).  This EUA will remain in effect (meaning this test can be used)  for the duration of  the COVID-19 declaration under Section 564(b)(1) of the Act, 21 U.S.C. section 360bbb-3(b)(1), unless the authorization  is terminated or revoked sooner.     Influenza A by PCR NEGATIVE NEGATIVE Final   Influenza B by PCR NEGATIVE NEGATIVE Final    Comment: (NOTE) The Xpert Xpress SARS-CoV-2/FLU/RSV plus assay is intended as an aid in the diagnosis of influenza from Nasopharyngeal swab specimens and should not be used as a sole basis for treatment. Nasal washings and aspirates are unacceptable for Xpert Xpress SARS-CoV-2/FLU/RSV testing.  Fact Sheet for Patients: EntrepreneurPulse.com.au  Fact Sheet for Healthcare Providers: IncredibleEmployment.be  This test is not yet approved or cleared by the [place] FDA and has been authorized for detection and/or diagnosis of SARS-CoV-2 by FDA under an Emergency Use Authorization (EUA). This EUA will remain in effect (meaning this test can be used) for the duration of the COVID-19 declaration under Section 564(b)(1) of the Act, 21 U.S.C. section 360bbb-3(b)(1), unless the authorization is terminated or revoked.  Performed at [facility], [place] [address] [place], [place] [address]   MRSA PCR Screening     Status: None   Collection Time: 07/17/20  6:04 PM   Specimen: Nasopharyngeal  Result Value Ref Range Status   MRSA by PCR NEGATIVE NEGATIVE Final    Comment:        The GeneXpert MRSA Assay (FDA approved for NASAL specimens only), is one component of a comprehensive MRSA colonization surveillance program. It is not intended to diagnose MRSA infection nor to guide or monitor treatment for MRSA infections. Performed at [facility], [place] [address] [place], [place] [address]     FURTHER DISCHARGE INSTRUCTIONS:  Get Medicines reviewed and adjusted: Please take all your medications with you for your next visit with your Primary MD  Laboratory/radiological data: Please request your Primary MD to go over all hospital tests and procedure/radiological results at the follow up, please ask your  Primary MD to get all Hospital records sent to his/her office.  In some cases, they will be blood work, cultures and biopsy results pending at the time of your discharge. Please request that your primary care M.D. goes through all the records of your hospital data and follows up on these results.  Also Note the following: If you experience worsening of your admission symptoms, develop shortness of breath, life threatening emergency, suicidal or homicidal thoughts you must seek medical  attention immediately by calling 911 or calling your MD immediately  if symptoms less severe.  You must read complete instructions/literature along with all the possible adverse reactions/side effects for all the Medicines you take and that have been prescribed to you. Take any new Medicines after you have completely understood and accpet all the possible adverse reactions/side effects.   Do not drive when taking Pain medications or sleeping medications (Benzodaizepines)  Do not take more than prescribed Pain, Sleep and Anxiety Medications. It is not advisable to combine anxiety,sleep and pain medications without talking with your primary care practitioner  Special Instructions: If you have smoked or chewed Tobacco  in the last 2 yrs please stop smoking, stop any regular Alcohol  and or any Recreational drug use.  Wear Seat belts while driving.  Please note: You were cared for by a hospitalist during your hospital stay. Once you are discharged, your primary care physician will handle any further medical issues. Please note that NO REFILLS for any discharge medications will be authorized once you are discharged, as it is imperative that you return to your primary care physician (or establish a relationship with a primary care physician if you do not have one) for your post hospital discharge needs so that they can reassess your need for medications and monitor your lab values.  Total Time spent coordinating discharge  including counseling, education and face to face time equals 35 minutes.  SignedOren Binet 07/18/2020 10:26 AM

## 2020-07-18 NOTE — Progress Notes (Signed)
Reviewed tele with Dr. Curt Bears.   Consistent with previous findings of brief (<3 second) pauses and bradycardia, primarily nocturnal.   Would continue current plan of close EP follow up as outpatient for further discussion of risk vs benefit of pacer with Dr. Lovena Le.   As noted, she is due for trach change out in the next month, which would even further increase her already very high risk of infection from pacer.   Legrand Como [address]" Hart, PA-C  07/18/2020 8:11 AM

## 2020-07-19 ENCOUNTER — Telehealth: Payer: Self-pay

## 2020-07-19 NOTE — Telephone Encounter (Signed)
  Transition Care Management Follow-up Telephone Call  Date of discharge and from where:[facility] 07/15/2020 How have you been since you were released from the hospital?[facility] pt [place] [facility], DPR SIGNED FOR [facility] ON 03-27-20. OK TO SPEAK WITH CLAUDIA GARCIA stated "Stable but tired"  Any questions or concerns? No questions/concerns reported.  Items Reviewed: Did the pt receive and understand the discharge instructions provided? Stated that she has  instructions and have no questions.  Medications obtained and verified? She said that they have the medication list  and the hospital staff reviewed them in detail prior to discharge. She said that she has all of the medications and have have no questions.  Any new allergies since your discharge? None reported  Do you have support at home? Yes, family and kids Other (ie: DME, Home Health, etc)       Functional Questionnaire: (I = Independent and D = Dependent) ADL's:  Independent.      Follow up appointments reviewed:   PCP Hospital f/u appt confirmed? Dr Margarita Rana on 07/31/2020.  [facility] f/u appt confirmed? Cardio scheduled at this time  Are transportation arrangements needed?. They have transportation   If their condition worsens, is the pt aware to call  their PCP or go to the ED? Yes.Made pt aware if condition worsen or start experiencing rapid weight gain, chest pain, diff breathing, SOB, high fevers, or bleading to refer imediately to ED for further evaluation.  Was the patient provided with contact information for the PCP's office or ED? He has the phone number  Was the pt encouraged to call back with questions or concerns?yes  Britt Bolognese stated routinely checking BG, last reading recorded in the morning 158 fasting.  Stated she has been taking medications as prescribed.

## 2020-07-20 NOTE — Progress Notes (Signed)
Pt left a voice message via the language interpreter line [id number]), and informed not to show up for her covid test on Mon 07/22/20 due to pt testing + for covid on 07/16/20(results in Epic). Based on the guidelines the pt is in the 90 day window to not retest. The pt is still expected to quarantine until their procedure. Therefore, the pt can still have the scheduled procedure on 07/25/20, if asymptomatic.

## 2020-07-22 ENCOUNTER — Inpatient Hospital Stay (HOSPITAL_COMMUNITY)
Admission: RE | Admit: 2020-07-22 | Discharge: 2020-07-22 | Disposition: A | Discharge status: Home / Self Care | Payer: Medicare Other | Source: Ambulatory Visit

## 2020-07-24 ENCOUNTER — Other Ambulatory Visit: Payer: Self-pay

## 2020-07-24 ENCOUNTER — Ambulatory Visit (HOSPITAL_COMMUNITY)
Admission: RE | Admit: 2020-07-24 | Discharge: 2020-07-24 | Disposition: A | Discharge status: Home / Self Care | Payer: Medicare Other | Source: Ambulatory Visit | Attending: Acute Care | Admitting: Acute Care

## 2020-07-24 DIAGNOSIS — Z430 Encounter for attention to tracheostomy: Secondary | ICD-10-CM | POA: Insufficient documentation

## 2020-07-24 DIAGNOSIS — Z930 Tracheostomy status: Secondary | ICD-10-CM | POA: Diagnosis not present

## 2020-07-24 DIAGNOSIS — J386 Stenosis of larynx: Secondary | ICD-10-CM | POA: Diagnosis not present

## 2020-07-24 DIAGNOSIS — J383 Other diseases of vocal cords: Secondary | ICD-10-CM | POA: Insufficient documentation

## 2020-07-24 NOTE — Progress Notes (Signed)
Chief complaint/reason for visit Plan tracheostomy change  HPI 75 year old female whom I follow for tracheostomy management along with [facility].  She is followed by ENT there.  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  Last seen at [facility] about 1 month ago, still deemed not appropriate for decannulation.  Presents today for planned tracheostomy change.  Review of systems General: No fever, chills, sick exposure HEENT: No headache nasal discharge tracheostomy drainage sore throat, has however noted tracheostomy stoma a little more tender than usual Pulmonary: No shortness of breath no new cough or wheeze no chest pain Cardiac: No chest pain lightheadedness Abdomen: Nontender no nausea vomiting diarrhea Extremities: No weakness Neuro: No focal weakness no headache no seizure activity  Exam General Pleasant 75 year old non-English-speaking female sitting in wheelchair accompanied by her daughter-in-law and in [place] interpreter she is in no acute distress HEENT: Size 4 cuffless tracheostomy in place, she is able to phonate with PMV some but does have breathlessness still.  There is some mild granulation tissue at the bottom of the tracheostomy stoma located between 6 and 8:00 in relation to the stoma Pulmonary: Clear to auscultation no accessory use currently room air Cardiac: Regular rate and rhythm Abdomen nontender Neuro intact  Procedure The current size 4 tracheostomy was removed, tracheostomy stoma inspected found to be unremarkable new size 4 cuffless tracheostomy placed without difficulty appropriate positioning confirmed with end-tidal CO2  Impression/plan Tracheostomy dependence in the setting of subglottic stenosis and vocal cord dysfunction  Discussion Still not a candidate for decannulation per last ENT note, I would be surprised if she is ever a candidate for decannulation.   Of note she recently had a  admission for symptomatic bradycardia at which time she was seen by cardiology.  At that time she was found to be sinus bradycardia.  Electrophysiology saw her, at this time they had wanted to continue with close follow-up in the outpatient setting and not proceed with tracheostomy given concern about risk for infection, in regards to the tracheostomy and pacemaker certainly understand reluctance given concern for possible infection however her tracheostomy stoma is actually quite clean, and from our standpoint unless it is an absolute contraindication from an EP standpoint from a tracheostomy standpoint would have no problem with proceeding with pacemaker if indicated as long as patient clearly understands the risk.   Erick Colace ACNP-BC [place] Pager # [phone] OR # [phone] if no answer

## 2020-07-24 NOTE — Progress Notes (Signed)
Tracheostomy Procedure Note  Shaneta Cervenka [id number] [birth year]  Pre Procedure Tracheostomy Information  Trach Brand: Shiley Size: 4.0 Style: Uncuffed Secured by: Velcro   Procedure: Trach cleaning and Trach Change    Post Procedure Tracheostomy Information  Trach Brand: Shiley Size: 4.0   [id number] Style: Uncuffed Secured by: Velcro New Style placed today  [id number]  Post Procedure Evaluation:  ETCO2 positive color change from yellow to purple : Yes.   Vital signs:VSS Patients current condition: stable Complications: No apparent complications Trach site exam: clean and dry Wound care done: 4 x 4 drain gauze Patient did tolerate procedure well.   Education: New trach style demonstration and education for patient and family member  Prescription needs: none    Additional needs: New PMV and additioal trach given to family to take home

## 2020-07-25 ENCOUNTER — Ambulatory Visit: Payer: Medicare Other | Admitting: Cardiology

## 2020-07-25 ENCOUNTER — Telehealth: Payer: Self-pay

## 2020-07-25 DIAGNOSIS — R061 Stridor: Secondary | ICD-10-CM

## 2020-07-25 NOTE — Telephone Encounter (Signed)
-----   Message from Erick Colace, NP sent at 07/25/2020  2:03 PM EST ----- Regarding: DME need Can you please order  [phone] trach for this patient    Thanks  Laurey Arrow

## 2020-07-25 NOTE — Telephone Encounter (Signed)
Patient son, returned call.   Order sent to [employer].   Voiced understanding.   Nothing further needed at this time.

## 2020-07-25 NOTE — Telephone Encounter (Signed)
Call made to patient via interpreter services, interpreter # [phone] [place].   Call made to patient, she does not know where she gets her trach supplies.    She gave me a number for her son [name] [phone].    Son [name] says he does not know and he will call back with the information.

## 2020-07-26 ENCOUNTER — Encounter: Payer: Self-pay | Admitting: Adult Health

## 2020-07-26 ENCOUNTER — Other Ambulatory Visit: Payer: Self-pay

## 2020-07-26 ENCOUNTER — Ambulatory Visit (INDEPENDENT_AMBULATORY_CARE_PROVIDER_SITE_OTHER): Payer: Medicare Other | Admitting: Adult Health

## 2020-07-26 VITALS — BP 148/72 | HR 57 | Ht 58.5 in | Wt 128.6 lb

## 2020-07-26 DIAGNOSIS — I63512 Cerebral infarction due to unspecified occlusion or stenosis of left middle cerebral artery: Secondary | ICD-10-CM

## 2020-07-26 DIAGNOSIS — I63411 Cerebral infarction due to embolism of right middle cerebral artery: Secondary | ICD-10-CM | POA: Diagnosis not present

## 2020-07-26 DIAGNOSIS — I4891 Unspecified atrial fibrillation: Secondary | ICD-10-CM | POA: Diagnosis not present

## 2020-07-26 DIAGNOSIS — J386 Stenosis of larynx: Secondary | ICD-10-CM | POA: Diagnosis not present

## 2020-07-26 DIAGNOSIS — I495 Sick sinus syndrome: Secondary | ICD-10-CM | POA: Diagnosis not present

## 2020-07-26 NOTE — Progress Notes (Signed)
Cardiology Office Note   Date:  07/26/2020   ID:  Jamie, Mitchell 1945/10/20, MRN [id number]  PCP:  Charlott Rakes, MD  Cardiologist: Dr.Schumann CC: [facility] Follow Up   History of Present Illness: Jamie Mitchell is a 75 y.o. 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  She was seen on consultation by Dr. Darcel Bayley for bradycardia and chest pain on 07/17/2020.  The patient comes with an interpreter as she does not speak [place].  She has been recommended for pacemaker when she was admitted for stroke in early February 2022 as her outpatient monitor revealed multiple sinus pauses longest lasting 3.3 seconds.  Pacemaker implantation was delayed secondary to tracheostomy.  She was seen by Dr. Curt Bears on consultation.  His note stated that she had no clear correlation of bradycardia with her symptoms, that she had increased vagal tone in the setting of trach which could exacerbate the condition.  He recommended that she continue amiodarone 200 mg daily as her atrial fibrillation was controlled and will remain in her system for some time (this was discontinued during hospitalization).    She comes today without any complaints.  She has had her trach replaced on 07/24/2020.  Her daughter states that her heart rate has been running between 30 and 50 at home.  She has some complaints of dizziness but does not complain of chest pain syncope or near syncope during the day or is awakened at night by the symptoms.  She continues on low-dose of amiodarone (she is cutting the pill in half).   Past Medical History:  Diagnosis Date  . Atrial fibrillation ([place])   . 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 DM (diabetes mellitus)  ([place])   . HLD (hyperlipidemia)   . Hypertension   . Hypothyroidism   . Renal failure 07/25/2019  . Stroke [facility])     Past Surgical History:  Procedure Laterality Date  . IR CT HEAD LTD  07/20/2019  . IR PERCUTANEOUS ART THROMBECTOMY/INFUSION INTRACRANIAL INC DIAG ANGIO  07/20/2019  . KNEE SURGERY Left   . RADIOLOGY WITH ANESTHESIA N/A 07/20/2019   Procedure: IR WITH ANESTHESIA;  Surgeon: Luanne Bras, MD;  Location: [place];  Service: Radiology;  Laterality: N/A;  . TRACHEOSTOMY TUBE PLACEMENT N/A 12/27/2019   Procedure: TRACHEOSTOMY;  Surgeon: Izora Gala, MD;  Location: [place];  Service: ENT;  Laterality: N/A;     Current Outpatient Medications  Medication Sig Dispense Refill  . amiodarone (PACERONE) 200 MG tablet Take 1 tablet (200 mg total) by mouth daily. 90 tablet 1  . atorvastatin (LIPITOR) 40 MG tablet Take 1 tablet (40 mg total) by mouth every evening. 90 tablet 3  . diclofenac Sodium (VOLTAREN) 1 % GEL Apply 2 g topically 4 (four) times daily as needed (shoulder pain). 50 g 4  . ELIQUIS 5 MG TABS tablet Take 1 tablet (5 mg total) by mouth 2 (two) times daily. 60 tablet 6  . EPINEPHrine 0.3 mg/0.3 mL IJ SOAJ injection Inject 0.3 mLs (0.3 mg total) into the muscle as needed for anaphylaxis. 1 each 1  . levothyroxine (SYNTHROID) 75 MCG tablet TAKE 1 TABLET (75 MCG TOTAL) BY MOUTH DAILY BEFORE BREAKFAST. 30 tablet 0  . metFORMIN (GLUCOPHAGE) 500 MG tablet Take 2 tablets (1,000 mg total) by mouth 2 (two) times daily with a meal. 180 tablet 1  .  pantoprazole (PROTONIX) 40 MG tablet Take 1 tablet (40 mg total) by mouth 2 (two) times daily before a meal. 60 tablet 6  . Pseudoeph-Doxylamine-DM-APAP (NYQUIL PO) Take 1 Dose by mouth at bedtime as needed (cough).     No current facility-administered medications for this visit.    Allergies:   Patient has no known allergies.    Social History:  The patient  reports that she has never smoked. She has never used smokeless tobacco. She  reports that she does not drink alcohol and does not use drugs.   Family History:  The patient's family history includes Birth defects in her daughter; Heart disease in her sister.    ROS: All other systems are reviewed and negative. Unless otherwise mentioned in H&P    PHYSICAL EXAM: VS:  BP (!) 148/72   Pulse (!) 57   Ht 4' 10.5" (1.486 m)   Wt 128 lb 9.6 oz (58.3 kg)   BMI 26.42 kg/m  , BMI Body mass index is 26.42 kg/m. GEN: Well nourished, well developed, in no acute distress HEENT: normal Neck: no JVD, carotid bruits, or masses Cardiac: RRR, bradycardic; soft systolic murmurs, rubs, or gallops,no edema  Respiratory:  Clear to auscultation bilaterally, normal work of breathing, tracheostomy in place with speaker valve GI: soft, nontender, nondistended, + BS MS: no deformity or atrophy Skin: warm and dry, no rash Neuro: Right hemiparesis Psych: euthymic mood, full affect   EKG:   Sinus bradycardia, heart rate of 57 bpm, otherwise normal.  (Personally reviewed)   Recent Labs: 12/21/2019: B Natriuretic Peptide 114.1 06/18/2020: ALT 21; TSH 0.590 07/17/2020: BUN 8; Creatinine, Ser 0.94; Hemoglobin 11.7; Magnesium 2.0; Magnesium 1.9; Platelets 348; Potassium 4.1; Sodium 133    Lipid Panel    Component Value Date/Time   CHOL 113 07/20/2019 0356   CHOL 137 02/26/2017 1035   TRIG 73 12/28/2019 0406   HDL 36 (L) 07/20/2019 0356   HDL 35 (L) 02/26/2017 1035   CHOLHDL 3.1 07/20/2019 0356   VLDL 11 07/20/2019 0356   LDLCALC 66 07/20/2019 0356   LDLCALC 49 02/26/2017 1035   LDLDIRECT 78 02/20/2016 1221      Wt Readings from Last 3 Encounters:  07/26/20 128 lb 9.6 oz (58.3 kg)  07/10/20 129 lb (58.5 kg)  03/27/20 135 lb 2 oz (61.3 kg)      Other studies Reviewed:  Procedure 07/24/2020 The current size 4 tracheostomy was removed, tracheostomy stoma inspected found to be unremarkable new size 4 cuffless tracheostomy placed without difficulty appropriate positioning  confirmed with end-tidal CO2  Echocardiogram 07/20/2019  1. Left ventricular ejection fraction, by estimation, is 60 to 65%. The  left ventricle has normal function. The left ventricle has no regional  wall motion abnormalities. Left ventricular diastolic parameters were  normal.  2. Right ventricular systolic function is normal. The right ventricular  size is normal. Tricuspid regurgitation signal is inadequate for assessing  PA pressure.  3. The mitral valve is normal in structure and function. Mild mitral  valve regurgitation. No evidence of mitral stenosis.  4. The aortic valve is normal in structure and function. Aortic valve  regurgitation is not visualized. No aortic stenosis is present.  5. The inferior vena cava is normal in size with greater than 50%  respiratory variability, suggesting right atrial pressure of 3 mmHg.    ASSESSMENT AND PLAN:  1.  Tachybradycardia syndrome: Bradycardia noted during hospitalization on amiodarone.  The patient has been seen during hospitalization by Dr.  Camnitz in the setting of significant pauses.  Amiodarone was decreased to 200 mg daily.  Dr. Lovena Le was to follow-up with her to discuss need for pacemaker.  Daughter states that heart rate is between 30 and 50 bpm.  We will send her to see Dr. Lovena Le to discuss further need and timing for pacemaker implantation.  No changes in her medication.  2.  Paroxysmal atrial fibrillation: Continues on Eliquis 5 mg twice daily as directed.  No complaints of bleeding.  Timing of pacemaker and holding Eliquis per Dr. Lovena Le.  She will continue current medication regimens until then.  3.  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  Continues on Eliquis as above.  4.  Hyperlipidemia: Continue atorvastatin as directed.  5.  Subglottic stenosis: Status post tracheotomy change.  Followed by pulmonary.   Current medicines are reviewed at length with the patient today.   I have spent 25 minutes dedicated to the care of this patient on the date of this encounter to include pre-visit review of records, assessment, management and diagnostic testing,with shared decision making.  Labs/ tests ordered today include: None Phill Myron. West Pugh, ANP, AACC   07/26/2020 4:23 PM    [facility] [place] [address] Office [phone] Fax [phone]  Notice: This dictation was prepared with Dragon dictation along with smaller phrase technology. Any transcriptional errors that result from this process are unintentional and may not be corrected upon review.

## 2020-07-26 NOTE — Patient Instructions (Signed)
Medication Instructions:  Continue current medications  *If you need a refill on your cardiac medications before your next appointment, please call your pharmacy*   Lab Work: None Ordered   Testing/Procedures: None Ordered   Follow-Up: At [facility], you and your health needs are our priority.  As part of our continuing mission to provide you with exceptional heart care, we have created designated Provider Care Teams.  These Care Teams include your primary Cardiologist (physician) and Advanced Practice Providers (APPs -  Physician Assistants and Nurse Practitioners) who all work together to provide you with the care you need, when you need it.  We recommend signing up for the patient portal called "MyChart".  Sign up information is provided on this After Visit Summary.  MyChart is used to connect with patients for Virtual Visits (Telemedicine).  Patients are able to view lab/test results, encounter notes, upcoming appointments, etc.  Non-urgent messages can be sent to your provider as well.   To learn more about what you can do with MyChart, go to [email or web address].    Your next appointment:   Next available with Dr Lovena Le

## 2020-07-31 ENCOUNTER — Ambulatory Visit (HOSPITAL_BASED_OUTPATIENT_CLINIC_OR_DEPARTMENT_OTHER): Payer: Medicare Other | Admitting: Family Medicine

## 2020-07-31 ENCOUNTER — Ambulatory Visit
Admission: RE | Admit: 2020-07-31 | Discharge: 2020-07-31 | Disposition: A | Discharge status: Home / Self Care | Payer: Medicare Other | Source: Ambulatory Visit | Attending: Internal Medicine | Admitting: Internal Medicine

## 2020-07-31 ENCOUNTER — Other Ambulatory Visit: Payer: Self-pay

## 2020-07-31 ENCOUNTER — Other Ambulatory Visit: Payer: Self-pay | Admitting: Family Medicine

## 2020-07-31 ENCOUNTER — Encounter: Payer: Self-pay | Admitting: Family Medicine

## 2020-07-31 VITALS — BP 139/74 | HR 57 | Ht <= 58 in | Wt 129.8 lb

## 2020-07-31 DIAGNOSIS — E038 Other specified hypothyroidism: Secondary | ICD-10-CM | POA: Diagnosis not present

## 2020-07-31 DIAGNOSIS — I480 Paroxysmal atrial fibrillation: Secondary | ICD-10-CM | POA: Insufficient documentation

## 2020-07-31 DIAGNOSIS — E1159 Type 2 diabetes mellitus with other circulatory complications: Secondary | ICD-10-CM

## 2020-07-31 DIAGNOSIS — R5383 Other fatigue: Secondary | ICD-10-CM

## 2020-07-31 DIAGNOSIS — R634 Abnormal weight loss: Secondary | ICD-10-CM | POA: Insufficient documentation

## 2020-07-31 DIAGNOSIS — Z930 Tracheostomy status: Secondary | ICD-10-CM | POA: Diagnosis not present

## 2020-07-31 DIAGNOSIS — I69351 Hemiplegia and hemiparesis following cerebral infarction affecting right dominant side: Secondary | ICD-10-CM | POA: Diagnosis not present

## 2020-07-31 DIAGNOSIS — R112 Nausea with vomiting, unspecified: Secondary | ICD-10-CM | POA: Insufficient documentation

## 2020-07-31 DIAGNOSIS — I4891 Unspecified atrial fibrillation: Secondary | ICD-10-CM | POA: Diagnosis not present

## 2020-07-31 DIAGNOSIS — Z7901 Long term (current) use of anticoagulants: Secondary | ICD-10-CM | POA: Insufficient documentation

## 2020-07-31 DIAGNOSIS — I10 Essential (primary) hypertension: Secondary | ICD-10-CM | POA: Insufficient documentation

## 2020-07-31 DIAGNOSIS — Z7984 Long term (current) use of oral hypoglycemic drugs: Secondary | ICD-10-CM | POA: Insufficient documentation

## 2020-07-31 DIAGNOSIS — I63512 Cerebral infarction due to unspecified occlusion or stenosis of left middle cerebral artery: Secondary | ICD-10-CM | POA: Diagnosis not present

## 2020-07-31 DIAGNOSIS — E785 Hyperlipidemia, unspecified: Secondary | ICD-10-CM | POA: Diagnosis not present

## 2020-07-31 DIAGNOSIS — Z79899 Other long term (current) drug therapy: Secondary | ICD-10-CM | POA: Insufficient documentation

## 2020-07-31 DIAGNOSIS — I63412 Cerebral infarction due to embolism of left middle cerebral artery: Secondary | ICD-10-CM

## 2020-07-31 DIAGNOSIS — R1110 Vomiting, unspecified: Secondary | ICD-10-CM | POA: Diagnosis not present

## 2020-07-31 IMAGING — CT CT ABD-PELV W/ CM
2 of 5 series · 15 of 46 positions shown, 17 images · IV contrast (omnipaque)
Comparison: None

CLINICAL DATA: Nausea, vomiting and weight loss. Three months
duration. History of metformin utilization. Slight improvement by
report after cessation of this medication.

EXAM:
CT ABDOMEN AND PELVIS WITH CONTRAST
TECHNIQUE: Multidetector CT imaging of the abdomen and pelvis was performed
using the standard protocol following bolus administration of
intravenous contrast.
CONTRAST:  80mL OMNIPAQUE IOHEXOL 300 MG/ML  SOLN

[Series 2: axial st · axial · 0.73mm/px · z∈[+1121,+1491]mm · 12 of 88 slices shown, 14 images]
[im 7/88  soft-tissue]
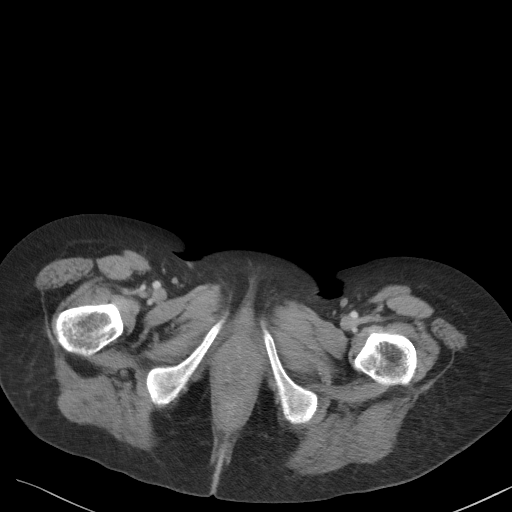
[im 7/88  bone]
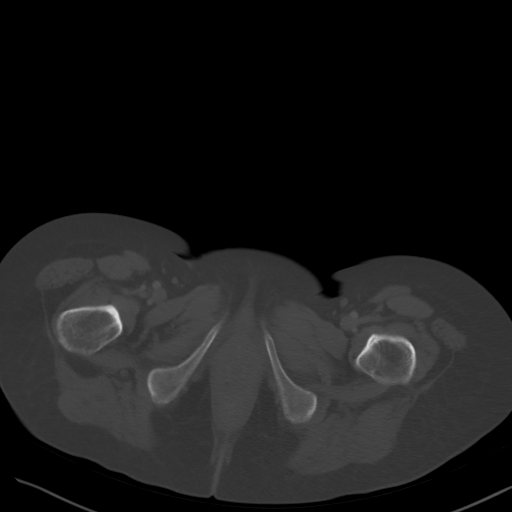
[im 14/88  soft-tissue]
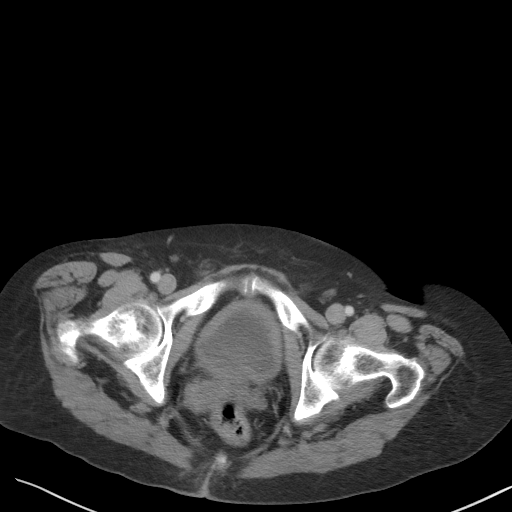
[im 21/88  soft-tissue]
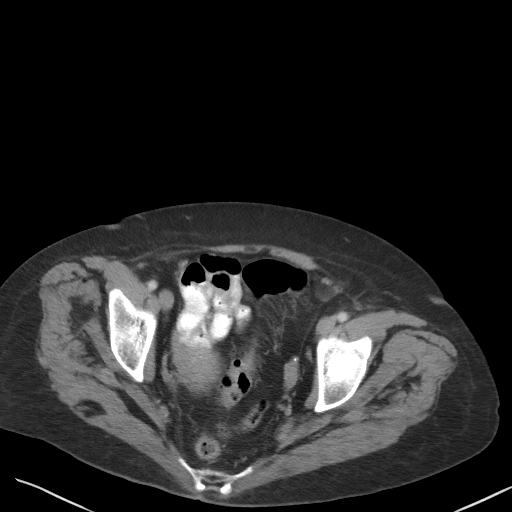
[im 27/88  soft-tissue]
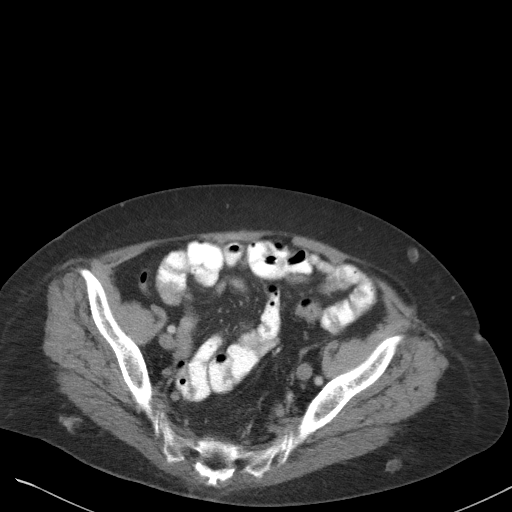
[im 34/88  soft-tissue]
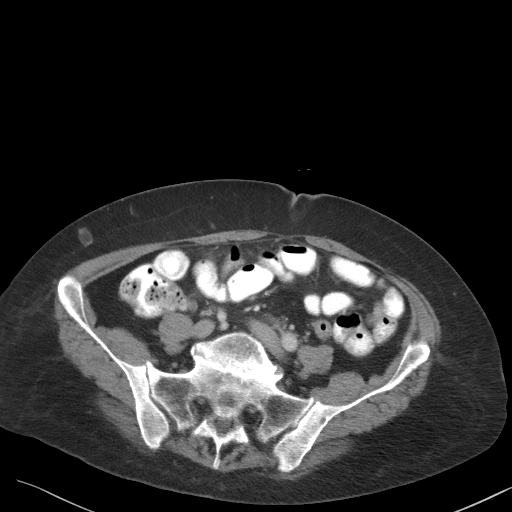
[im 41/88  soft-tissue]
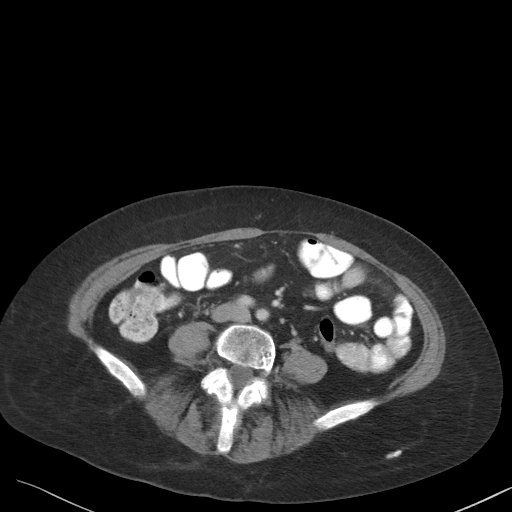
[im 47/88  soft-tissue]
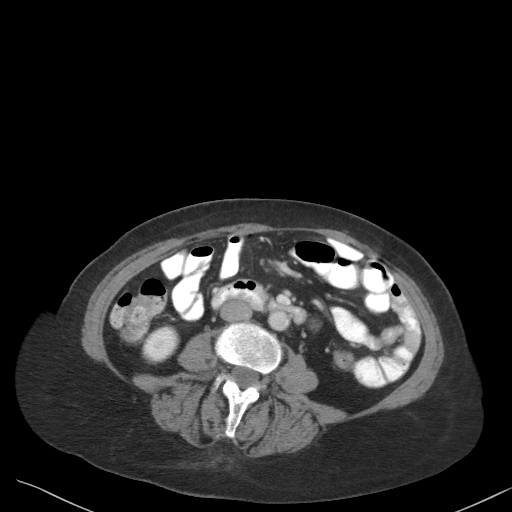
[im 54/88  soft-tissue]
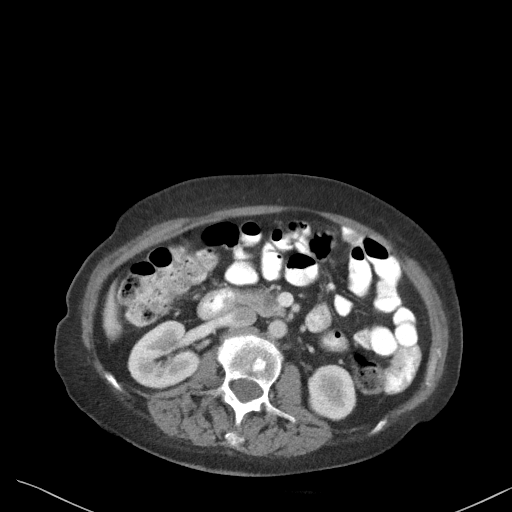
[im 61/88  soft-tissue]
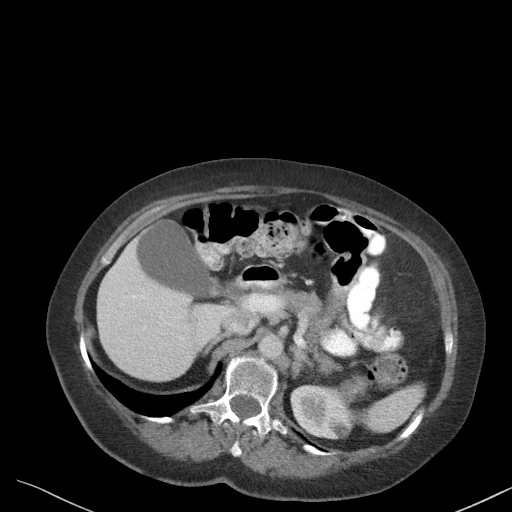
[im 61/88  bone]
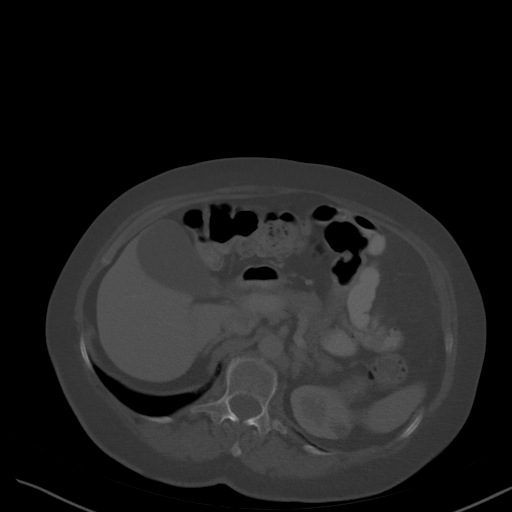
[im 67/88  soft-tissue]
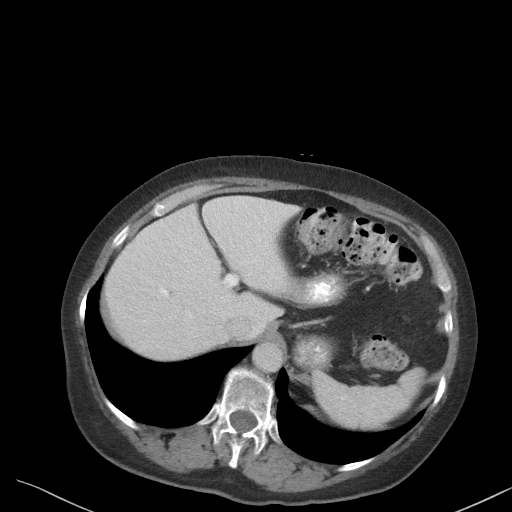
[im 74/88  soft-tissue]
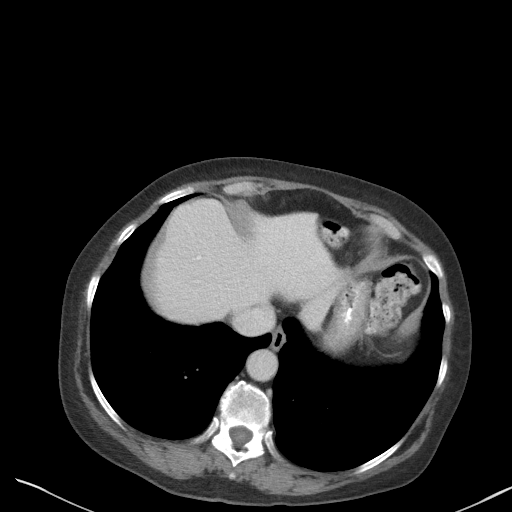
[im 81/88  soft-tissue]
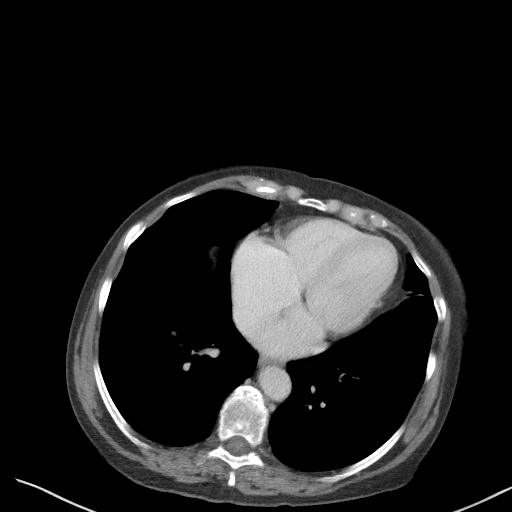

[Series 4: coronal st · coronal · 0.74mm/px · 3 of 82 slices shown]
[im 28/82  soft-tissue]
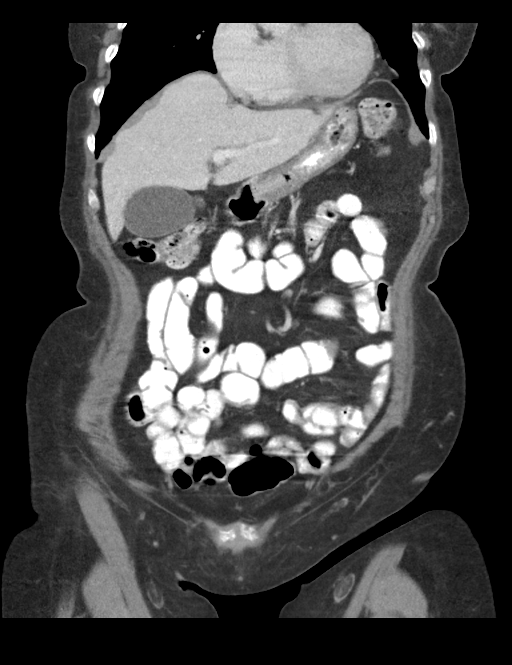
[im 37/82  soft-tissue]
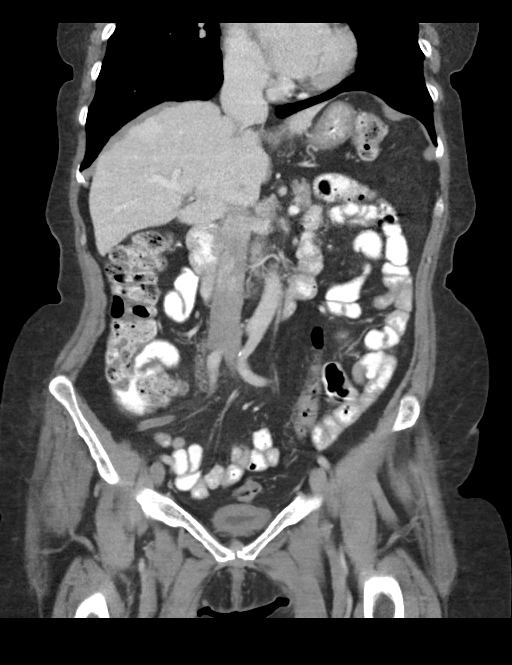
[im 46/82  soft-tissue]
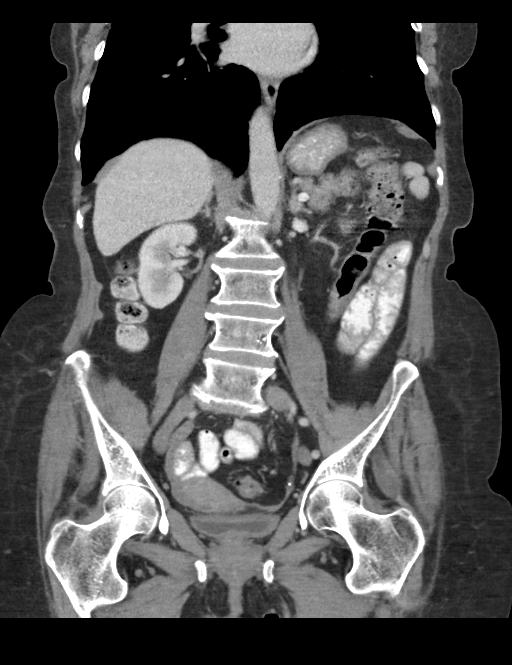

[15 of 46 positions shown; findings below may reference images not displayed]

FINDINGS: Lower chest: Lung bases are clear.  No effusion.  No consolidation.

Hepatobiliary: 1.5 x 1.5 cm hypervascular focus in the dome of the
RIGHT hepatic lobe hepatic subsegment VIII. No pericholecystic
stranding. No biliary duct dilation. Portal vein is patent.

Pancreas: Low-attenuation about the pancreatic head. No sign of
ductal dilation or pancreatic inflammation. Pattern suggestive of
fatty atrophy.

Spleen: Normal appearance of the spleen.

Adrenals/Urinary Tract: Adrenal glands are normal.

Renal cortical scarring bilaterally. Small cyst in the upper pole
the LEFT kidney. No hydronephrosis.

Stomach/Bowel: Gastrointestinal tract without acute process. Normal
appendix. Stool fills much of the colon.

Vascular/Lymphatic: Calcified atheromatous plaque in the abdominal
aorta. No aneurysmal dilation. Patent abdominal vasculature. There
is no gastrohepatic or hepatoduodenal ligament lymphadenopathy. No
retroperitoneal or mesenteric lymphadenopathy.

No pelvic sidewall lymphadenopathy.

Reproductive: No adnexal mass.  Uterus in situ.  Unremarkable by CT.

Other: No ascites

Musculoskeletal: Small fat containing umbilical hernia. Spinal
degenerative changes. No acute or destructive bone process. Grade 2
anterolisthesis of L5 on S1.
IMPRESSION: 1. No acute findings in the abdomen or pelvis.
2. Slightly greater than 1.5 cm hypervascular focus in the dome of
the RIGHT hemi liver is indeterminate. Consider abdominal MRI for
further evaluation. Area not imaged on delayed phase.
3. Variable attenuation of the pancreatic parenchyma is favored to
represent fatty atrophy. This could also be assessed with MRI to
exclude underlying pancreatic lesion.
4. Renal cortical scarring bilaterally.
5. Small fat containing umbilical hernia.
6. Aortic atherosclerosis.

These results will be called to the ordering clinician or
representative by the Radiologist Assistant, and communication
documented in the PACS or [REDACTED].

Aortic Atherosclerosis ([R5]-[R5]).

## 2020-07-31 MED ORDER — IOHEXOL 300 MG/ML  SOLN
100.0000 mL | Freq: Once | INTRAMUSCULAR | Status: AC | PRN
  Administered 2020-07-31: 80 mL via INTRAVENOUS

## 2020-07-31 MED ORDER — ELIQUIS 5 MG PO TABS: 5.0000 mg | ORAL_TABLET | Freq: Two times a day (BID) | ORAL | 6 refills | Status: DC

## 2020-07-31 MED ORDER — LEVOTHYROXINE SODIUM 75 MCG PO TABS: 75.0000 ug | ORAL_TABLET | Freq: Every day | ORAL | 6 refills | Status: DC

## 2020-07-31 NOTE — Telephone Encounter (Signed)
Pt has an appointment today with PCP

## 2020-07-31 NOTE — Patient Instructions (Signed)

## 2020-07-31 NOTE — Progress Notes (Signed)
Pt was in [place] on 07/15/20 Very fatigue.

## 2020-07-31 NOTE — Progress Notes (Signed)
Subjective:  Patient ID: Jamie Mitchell, female    DOB: May 30, 1945  Age: 75 y.o. 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  Hospitalized at [facility] from 07/15/2020 through 07/18/2020 after she presented with bradycardia. Seen by cardiology 5 days ago with referral to EP for evaluation for pacemaker placement.  She is accompanied by her son who complains the patient has been fatigued.  This is not a new complaint and she has been worked up with vitamin D levels returning normal, thyroid panel normal, slight anemia with hemoglobin of 11.7.  With regards to her Diabetes Mellitus she no longer takes Metformin as this was discontinued by GI due to GI symptoms of decreased appetite, satiety and symptoms improved since discontinuation. Fasting sugars range between 100 and 135. Past Medical History:  Diagnosis Date  . Atrial fibrillation ([place])   . 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 DM (diabetes mellitus) ([place])   . HLD (hyperlipidemia)   . Hypertension   . Hypothyroidism   . Renal failure 07/25/2019  . Stroke [facility])     Past Surgical History:  Procedure Laterality Date  . IR CT HEAD LTD  07/20/2019  . IR PERCUTANEOUS ART THROMBECTOMY/INFUSION INTRACRANIAL INC DIAG ANGIO  07/20/2019  . KNEE SURGERY Left   . RADIOLOGY WITH ANESTHESIA N/A 07/20/2019   Procedure: IR WITH ANESTHESIA;  Surgeon: Luanne Bras, MD;  Location: [place];  Service: Radiology;  Laterality: N/A;  . TRACHEOSTOMY TUBE PLACEMENT N/A 12/27/2019    Procedure: TRACHEOSTOMY;  Surgeon: Izora Gala, MD;  Location: [facility] OR;  Service: ENT;  Laterality: N/A;    Family History  Problem Relation Age of Onset  . Birth defects Daughter        feet  . Heart disease Sister     No Known Allergies  Outpatient Medications Prior to Visit  Medication Sig Dispense Refill  . amiodarone (PACERONE) 200 MG tablet Take 1 tablet (200 mg total) by mouth daily. 90 tablet 1  . atorvastatin (LIPITOR) 40 MG tablet Take 1 tablet (40 mg total) by mouth every evening. 90 tablet 3  . diclofenac Sodium (VOLTAREN) 1 % GEL Apply 2 g topically 4 (four) times daily as needed (shoulder pain). 50 g 4  . EPINEPHrine 0.3 mg/0.3 mL IJ SOAJ injection Inject 0.3 mLs (0.3 mg total) into the muscle as needed for anaphylaxis. 1 each 1  . pantoprazole (PROTONIX) 40 MG tablet Take 1 tablet (40 mg total) by mouth 2 (two) times daily before a meal. 60 tablet 6  . Pseudoeph-Doxylamine-DM-APAP (NYQUIL PO) Take 1 Dose by mouth at bedtime as needed (cough).    [name] ELIQUIS 5 MG TABS tablet Take 1 tablet (5 mg total) by mouth 2 (two) times daily. 60 tablet 6  . levothyroxine (SYNTHROID) 75 MCG tablet TAKE 1 TABLET (75 MCG TOTAL) BY MOUTH DAILY BEFORE BREAKFAST. 30 tablet 0  . metFORMIN (GLUCOPHAGE) 500 MG tablet Take 2 tablets (1,000 mg total) by mouth 2 (two) times daily with a meal. (Patient not taking: Reported on 07/31/2020) 180 tablet 1   No facility-administered medications prior to visit.     ROS Review of Systems  Constitutional:  Positive for fatigue. Negative for activity change and appetite change.  HENT: Negative for congestion, sinus pressure and sore throat.   Eyes: Negative for visual disturbance.  Respiratory: Negative for cough, chest tightness, shortness of breath and wheezing.   Cardiovascular: Negative for chest pain and palpitations.  Gastrointestinal: Negative for abdominal distention, abdominal pain and constipation.  Endocrine: Negative for polydipsia.   Genitourinary: Negative for dysuria and frequency.  Musculoskeletal: Negative for arthralgias and back pain.  Skin: Negative for rash.  Neurological: Negative for tremors, light-headedness and numbness.  Hematological: Does not bruise/bleed easily.  Psychiatric/Behavioral: Negative for agitation and behavioral problems.    Objective:  BP 139/74   Pulse (!) 57   Ht 4\' 10"  (1.473 m)   Wt 129 lb 12.8 oz (58.9 kg)   SpO2 99%   BMI 27.13 kg/m   BP/Weight 07/31/2020 07/26/2020 3/55/7322  Systolic BP 025 427 062  Diastolic BP 74 72 69  Wt. (Lbs) 129.8 128.6 -  BMI 27.13 26.42 -      Physical Exam Constitutional:      Appearance: She is well-developed.  Neck:     Vascular: No JVD.     Comments: Tracheostomy tube in place Cardiovascular:     Rate and Rhythm: Bradycardia present.     Heart sounds: Normal heart sounds. No murmur heard.   Pulmonary:     Effort: Pulmonary effort is normal.     Breath sounds: Normal breath sounds. No wheezing or rales.  Chest:     Chest wall: No tenderness.  Abdominal:     General: Bowel sounds are normal. There is no distension.     Palpations: Abdomen is soft. There is no mass.     Tenderness: There is no abdominal tenderness.  Musculoskeletal:        General: Normal range of motion.     Right lower leg: No edema.     Left lower leg: No edema.  Neurological:     Mental Status: She is alert and oriented to person, place, and time.  Psychiatric:        Mood and Affect: Mood normal.     CMP Latest Ref Rng & Units 07/17/2020 07/15/2020 06/18/2020  Glucose 70 - 99 mg/dL 99 103(H) 124(H)  BUN 8 - 23 mg/dL 8 5(L) 9  Creatinine 0.44 - 1.00 mg/dL 0.94 0.99 1.20(H)  Sodium 135 - 145 mmol/L 133(L) 133(L) 140  Potassium 3.5 - 5.1 mmol/L 4.1 4.2 4.7  Chloride 98 - 111 mmol/L 101 100 100  CO2 22 - 32 mmol/L 23 23 23   Calcium 8.9 - 10.3 mg/dL 8.7(L) 9.1 9.0  Total Protein 6.0 - 8.5 g/dL - - 6.7  Total Bilirubin 0.0 - 1.2 mg/dL - - 0.6  Alkaline  Phos 44 - 121 IU/L - - 87  AST 0 - 40 IU/L - - 18  ALT 0 - 32 IU/L - - 21    Lipid Panel     Component Value Date/Time   CHOL 113 07/20/2019 0356   CHOL 137 02/26/2017 1035   TRIG 73 12/28/2019 0406   HDL 36 (L) 07/20/2019 0356   HDL 35 (L) 02/26/2017 1035   CHOLHDL 3.1 07/20/2019 0356   VLDL 11 07/20/2019 0356   LDLCALC 66 07/20/2019 0356   LDLCALC 49 02/26/2017 1035   LDLDIRECT 78 02/20/2016 1221    CBC    Component Value Date/Time   WBC 5.4 07/17/2020 0333   RBC 4.00 07/17/2020 0333   HGB 11.7 (L)  07/17/2020 0333   HGB 12.7 02/14/2020 1336   HCT 35.0 (L) 07/17/2020 0333   HCT 39.6 02/14/2020 1336   PLT 348 07/17/2020 0333   PLT 324 02/14/2020 1336   MCV 87.5 07/17/2020 0333   MCV 95 02/14/2020 1336   MCH 29.3 07/17/2020 0333   MCHC 33.4 07/17/2020 0333   RDW 16.0 (H) 07/17/2020 0333   RDW 15.2 02/14/2020 1336   LYMPHSABS 2.7 02/14/2020 1336   MONOABS 0.7 12/02/2019 1021   EOSABS 0.0 02/14/2020 1336   BASOSABS 0.1 02/14/2020 1336    Lab Results  Component Value Date   HGBA1C 5.9 (H) 07/16/2020    Assessment & Plan:  1. Type 2 diabetes mellitus with other circulatory complication, without long-term current use of insulin (HCC) Controlled with A1c of 5.9 Currently on diet control Counseled on Diabetic diet, my plate method, 856 minutes of moderate intensity exercise/week Blood sugar logs with fasting goals of 80-120 mg/dl, random of less than 180 and in the event of sugars less than 60 mg/dl or greater than 400 mg/dl encouraged to notify the clinic. Advised on the need for annual eye exams, annual foot exams, Pneumonia vaccine.   2. Atrial fibrillation with RVR (HCC) With tachybradycardia syndrome On anticoagulation with Eliquis Continue amiodarone Has upcoming appointment for evaluation for pacemaker placement with EP - ELIQUIS 5 MG TABS tablet; Take 1 tablet (5 mg total) by mouth 2 (two) times daily.  Dispense: 60 tablet; Refill: 6  3. History of Left  middle cerebral artery stroke with residual right hemparesis Risk factor modification  4. Other fatigue This is chronic Work-up so far unrevealing  5. Tracheostomy status ([place]) Closely followed by ENT  6. Embolic stroke involving left middle cerebral artery (HCC) s/p tPA and mechanical thrombectomy, d/t AF Risk factor modification Continue statin  7. Other specified hypothyroidism Controlled - levothyroxine (SYNTHROID) 75 MCG tablet; Take 1 tablet (75 mcg total) by mouth daily before breakfast.  Dispense: 30 tablet; Refill: 6   Meds ordered this encounter  Medications  . ELIQUIS 5 MG TABS tablet    Sig: Take 1 tablet (5 mg total) by mouth 2 (two) times daily.    Dispense:  60 tablet    Refill:  6  . levothyroxine (SYNTHROID) 75 MCG tablet    Sig: Take 1 tablet (75 mcg total) by mouth daily before breakfast.    Dispense:  30 tablet    Refill:  6    Follow-up: Return in about 3 months (around 10/31/2020) for Chronic disease management.       Charlott Rakes, MD, FAAFP. [facility] and [place], [place]   07/31/2020, 5:52 PM

## 2020-08-01 ENCOUNTER — Ambulatory Visit: Payer: Medicare Other | Admitting: Family Medicine

## 2020-08-02 ENCOUNTER — Other Ambulatory Visit: Payer: Self-pay

## 2020-08-02 DIAGNOSIS — K769 Liver disease, unspecified: Secondary | ICD-10-CM

## 2020-08-06 ENCOUNTER — Other Ambulatory Visit: Payer: Self-pay

## 2020-08-06 DIAGNOSIS — K769 Liver disease, unspecified: Secondary | ICD-10-CM

## 2020-08-08 ENCOUNTER — Encounter: Payer: Self-pay | Admitting: Internal Medicine

## 2020-08-08 ENCOUNTER — Ambulatory Visit (INDEPENDENT_AMBULATORY_CARE_PROVIDER_SITE_OTHER): Payer: Medicare Other | Admitting: Internal Medicine

## 2020-08-08 ENCOUNTER — Other Ambulatory Visit: Payer: Self-pay

## 2020-08-08 VITALS — BP 122/64 | HR 59 | Ht <= 58 in | Wt 129.0 lb

## 2020-08-08 DIAGNOSIS — I10 Essential (primary) hypertension: Secondary | ICD-10-CM

## 2020-08-08 DIAGNOSIS — R001 Bradycardia, unspecified: Secondary | ICD-10-CM

## 2020-08-08 DIAGNOSIS — I480 Paroxysmal atrial fibrillation: Secondary | ICD-10-CM | POA: Diagnosis not present

## 2020-08-08 DIAGNOSIS — I63412 Cerebral infarction due to embolism of left middle cerebral artery: Secondary | ICD-10-CM

## 2020-08-08 NOTE — Progress Notes (Signed)
HPI Jamie Mitchell returns today for followup. She is a pleasant 75 yo woman with a h/o PAF, sinus node dysfunction and an indwelling tracheostomy. She has had documented sinus bradycardia though minimally symptomatic. She has not had syncope. Her AV conduction is good. She notes that her minimal symptoms have improved since she reduced her dose of amiodarone to 100 mg daily. She has rare palpitations.  No Known Allergies   Current Outpatient Medications  Medication Sig Dispense Refill  . amiodarone (PACERONE) 200 MG tablet Take 1 tablet (200 mg total) by mouth daily. (Patient taking differently: Take 100 mg by mouth daily.) 90 tablet 1  . atorvastatin (LIPITOR) 40 MG tablet Take 1 tablet (40 mg total) by mouth every evening. 90 tablet 3  . diclofenac Sodium (VOLTAREN) 1 % GEL Apply 2 g topically 4 (four) times daily as needed (shoulder pain). 50 g 4  . ELIQUIS 5 MG TABS tablet Take 1 tablet (5 mg total) by mouth 2 (two) times daily. 60 tablet 6  . EPINEPHrine 0.3 mg/0.3 mL IJ SOAJ injection Inject 0.3 mLs (0.3 mg total) into the muscle as needed for anaphylaxis. 1 each 1  . levothyroxine (SYNTHROID) 75 MCG tablet Take 1 tablet (75 mcg total) by mouth daily before breakfast. 30 tablet 6  . pantoprazole (PROTONIX) 40 MG tablet Take 1 tablet (40 mg total) by mouth 2 (two) times daily before a meal. 60 tablet 6  . Pseudoeph-Doxylamine-DM-APAP (NYQUIL PO) Take 1 Dose by mouth at bedtime as needed (cough).     No current facility-administered medications for this visit.     Past Medical History:  Diagnosis Date  . Atrial fibrillation ([place])   . 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 DM (diabetes mellitus) ([place])   . HLD (hyperlipidemia)   . Hypertension   . Hypothyroidism   . Renal failure 07/25/2019  . Stroke ([place])     ROS:   All systems reviewed and negative except as noted in the HPI.   Past Surgical History:  Procedure Laterality Date  . IR CT HEAD LTD  07/20/2019  . IR  PERCUTANEOUS ART THROMBECTOMY/INFUSION INTRACRANIAL INC DIAG ANGIO  07/20/2019  . KNEE SURGERY Left   . RADIOLOGY WITH ANESTHESIA N/A 07/20/2019   Procedure: IR WITH ANESTHESIA;  Surgeon: Luanne Bras, MD;  Location: [facility];  Service: Radiology;  Laterality: N/A;  . TRACHEOSTOMY TUBE PLACEMENT N/A 12/27/2019   Procedure: TRACHEOSTOMY;  Surgeon: Izora Gala, MD;  Location: [facility] OR;  Service: ENT;  Laterality: N/A;     Family History  Problem Relation Age of Onset  . Birth defects Daughter        feet  . Heart disease Sister      Social History   Socioeconomic History  . Marital status: Married    Spouse name: Not on file  . Number of children: 11  . Years of education: Not on file  . Highest education level: Not on file  Occupational History  . Not on file  Tobacco Use  . Smoking status: Never Smoker  . Smokeless tobacco: Never Used  Vaping Use  . Vaping Use: Never used  Substance and Sexual Activity  . Alcohol use: No  . Drug use: No  . Sexual activity: Not Currently    Birth control/protection: None    Comment: Married  Other Topics Concern  . Not on file  Social History Narrative   Lives with son Jamie Mitchell)    Social Determinants of Health  Financial Resource Strain: Not on file  Food Insecurity: Not on file  Transportation Needs: No Transportation Needs  . Lack of Transportation (Medical): No  . Lack of Transportation (Non-Medical): No  Physical Activity: Not on file  Stress: Not on file  Social Connections: Not on file  Intimate Partner Violence: Not on file     BP 122/64   Pulse (!) 59   Ht 4\' 9"  (1.448 m)   Wt 129 lb (58.5 kg)   SpO2 97%   BMI 27.92 kg/m   Physical Exam:  Well appearing NAD HEENT: Unremarkable Neck:  No JVD, no thyromegally; indwelling tracheostomy.  Lymphatics:  No adenopathy Back:  No CVA tenderness Lungs:  Clear with scattered rales HEART:  Regular rate rhythm, no murmurs, no rubs, no clicks Abd:  soft, positive bowel  sounds, no organomegally, no rebound, no guarding Ext:  2 plus pulses, no edema, no cyanosis, no clubbing Skin:  No rashes no nodules Neuro:  CN II through XII intact, motor grossly intact  EKG - sinus brady at 59/min   Assess/Plan: 1. PAF - she is mostly maintaining NSR and she will continue low dose amiodarone. 2. Sinus node dysfunction - She is a very poor candidate for PPM and she has not had syncope. She may have to live with episodes of atrial fib. Her indwelling and permanent tracheostomy will limit her pacing options. Because of sinus node dysfunction, she is not a good candidate for Micra 3. Indwelling tracheostomy - her long term prognosis is not good as these patients frequent get pneumonia. 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 4. HTN - her bp is well controlled today.

## 2020-08-08 NOTE — Patient Instructions (Addendum)
Medication Instructions:  °Your physician recommends that you continue on your current medications as directed. Please refer to the Current Medication list given to you today. ° °Labwork: °None ordered. ° °Testing/Procedures: °None ordered. ° °Follow-Up: °Your physician wants you to follow-up in: 6 months with Gregg Taylor, MD  ° ° °Any Other Special Instructions Will Be Listed Below (If Applicable). ° °If you need a refill on your cardiac medications before your next appointment, please call your pharmacy.  ° ° ° ° °

## 2020-08-20 ENCOUNTER — Other Ambulatory Visit: Payer: Self-pay | Admitting: Gastroenterology

## 2020-08-20 ENCOUNTER — Ambulatory Visit (HOSPITAL_COMMUNITY)
Admission: RE | Admit: 2020-08-20 | Discharge: 2020-08-20 | Disposition: A | Discharge status: Home / Self Care | Payer: Medicare Other | Source: Ambulatory Visit | Attending: Gastroenterology | Admitting: Gastroenterology

## 2020-08-20 ENCOUNTER — Other Ambulatory Visit: Payer: Self-pay

## 2020-08-20 DIAGNOSIS — K769 Liver disease, unspecified: Secondary | ICD-10-CM | POA: Diagnosis not present

## 2020-08-20 DIAGNOSIS — Z0389 Encounter for observation for other suspected diseases and conditions ruled out: Secondary | ICD-10-CM | POA: Diagnosis not present

## 2020-08-20 IMAGING — MR MR ABDOMEN WO/W CM
19 of 21 series · 45 of 48 positions shown · IV contrast (7ml GADAVIST)
Comparison: [DATE] CT

CLINICAL DATA: CT demonstrating possible liver and pancreatic
lesions.

EXAM:
MRI ABDOMEN WITHOUT AND WITH CONTRAST
TECHNIQUE: Multiplanar multisequence MR imaging of the abdomen was performed
both before and after the administration of intravenous contrast.
CONTRAST:  6mL GADAVIST GADOBUTROL 1 MMOL/ML IV SOLN

[Series 4: haste_cor_mbh · coronal · 6.0mm · 1.56mm/px · 1 of 36 slices shown]
[im 1/36]
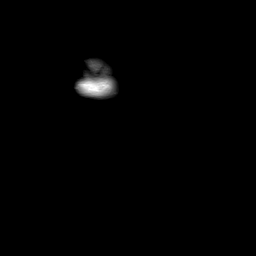

[Series 5: ax_trufi_mbh · axial · 6.0mm · 1.12mm/px · 1 of 40 slices shown]
[im 1/40]
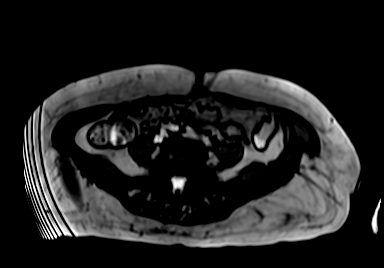

[Series 8: ax_diff_fb_tracew_dfc_mix · axial · 6.0mm · 1.60mm/px · 1 of 72 slices shown]
[im 1/72]
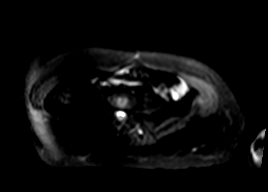

[Series 9: ax_diff_fb_adc_dfc_mix · axial · 6.0mm · 1.60mm/px · 1 of 36 slices shown]
[im 1/36]
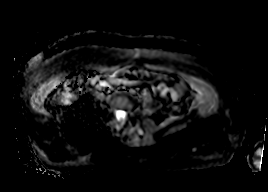

[Series 12: T2 fat-sat · axial · 6.0mm · 1.25mm/px · 1 of 36 slices shown]
[im 1/36]
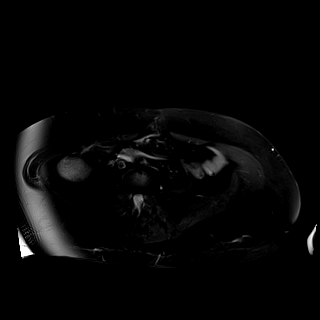

[Series 13: T1 dynamic · axial · 3.0mm · 1.25mm/px · z∈[-151,+86]mm · 3 of 80 slices shown (1 of 9)]
[im 1/80]
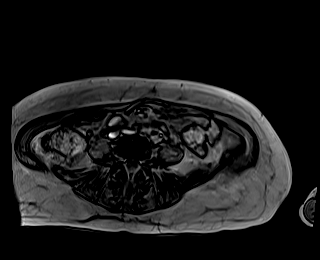
[im 40/80]
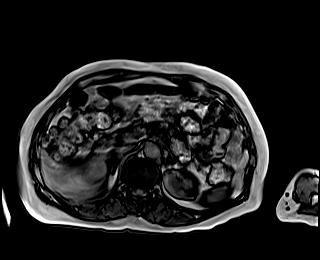
[im 80/80]
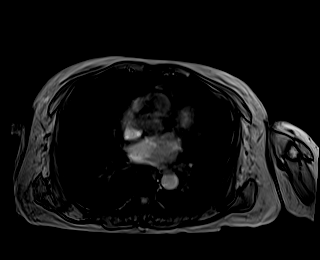

[Series 14: T1 dynamic · axial · 3.0mm · 1.25mm/px · z∈[-151,+86]mm · 3 of 80 slices shown (2 of 9)]
[im 1/80]
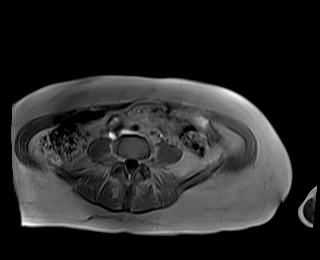
[im 40/80]
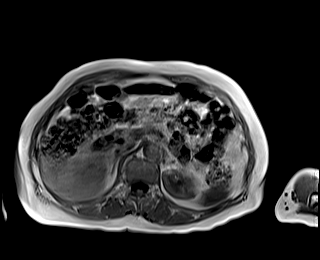
[im 80/80]
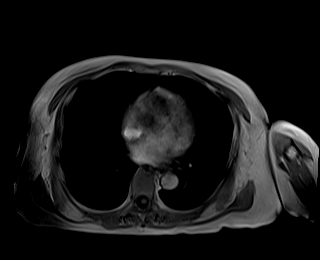

[Series 16: T1 dynamic · axial · 3.0mm · 1.25mm/px · z∈[-151,+86]mm · 3 of 80 slices shown (3 of 9)]
[im 1/80]
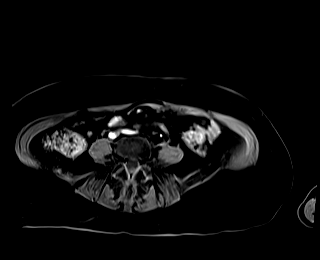
[im 40/80]
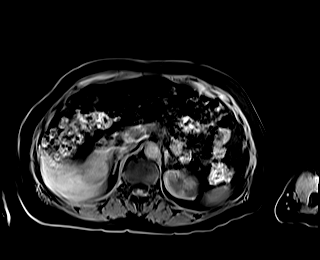
[im 80/80]
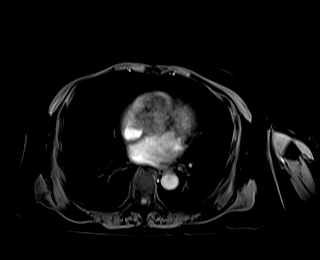

[Series 20: T1 dynamic · axial · 3.0mm · 1.25mm/px · z∈[-151,+86]mm · 3 of 80 slices shown (4 of 9)]
[im 1/80]
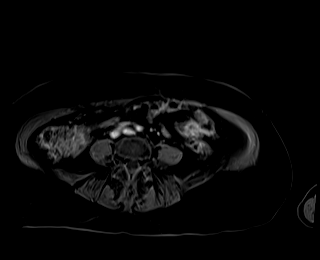
[im 40/80]
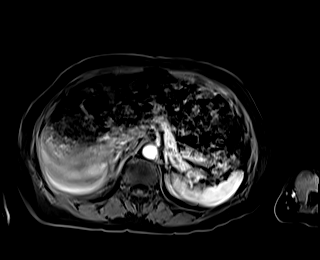
[im 80/80]
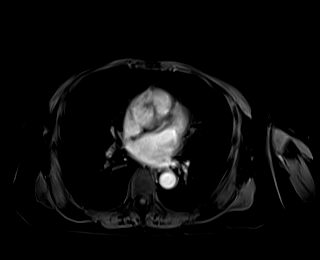

[Series 21: T1 dynamic · axial · 3.0mm · 1.25mm/px · z∈[-151,+86]mm · 3 of 80 slices shown (5 of 9)]
[im 1/80]
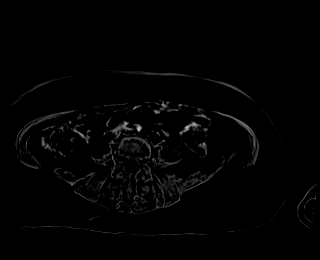
[im 40/80]
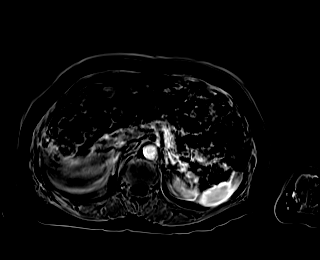
[im 80/80]
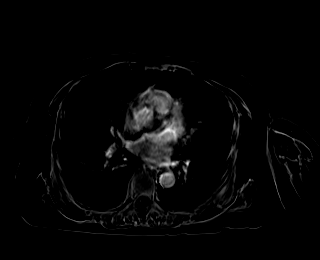

[Series 24: T1 dynamic · axial · 3.0mm · 1.25mm/px · z∈[-151,+86]mm · 3 of 80 slices shown (6 of 9)]
[im 1/80]
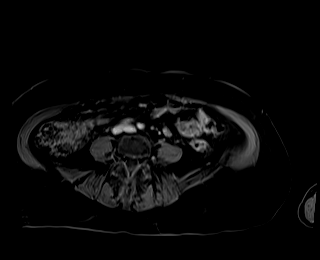
[im 40/80]
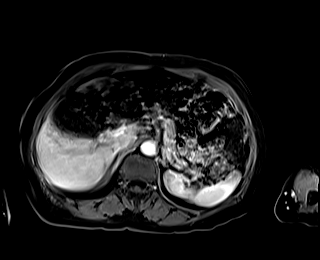
[im 80/80]
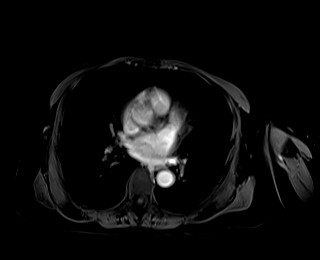

[Series 25: T1 dynamic · axial · 3.0mm · 1.25mm/px · z∈[-151,+86]mm · 3 of 80 slices shown (7 of 9)]
[im 1/80]
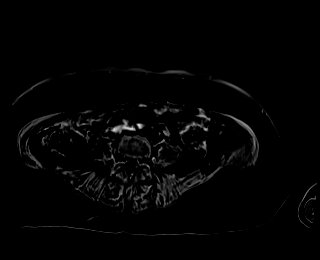
[im 40/80]
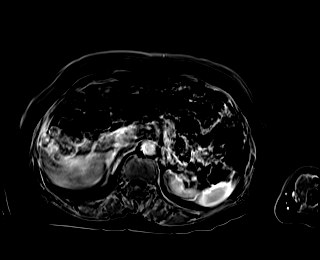
[im 80/80]
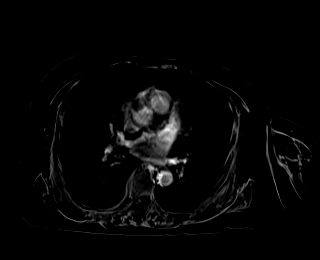

[Series 29: T1 dynamic · axial · 3.0mm · 1.25mm/px · z∈[-151,+86]mm · 3 of 80 slices shown (8 of 9)]
[im 1/80]
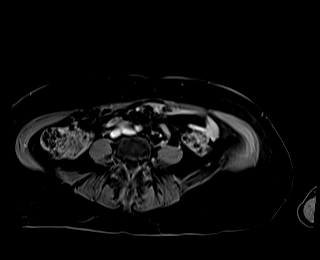
[im 40/80]
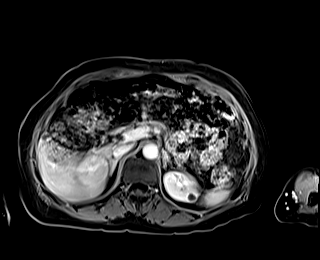
[im 80/80]
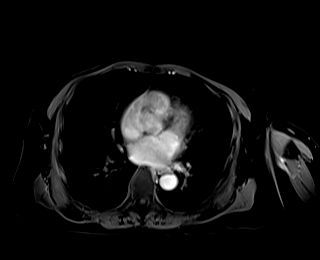

[Series 30: T1 dynamic · axial · 3.0mm · 1.25mm/px · z∈[-151,+86]mm · 3 of 80 slices shown (9 of 9)]
[im 1/80]
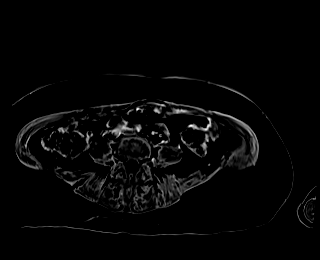
[im 40/80]
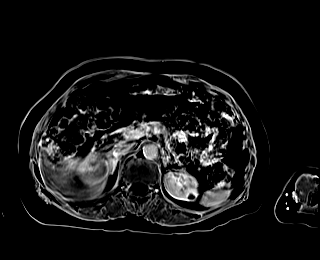
[im 80/80]
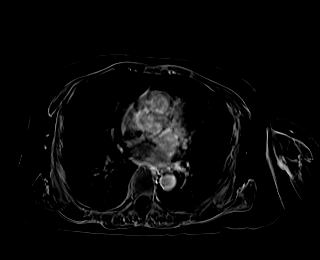

[Series 31: ax_haste_mbh · axial · 6.0mm · 1.25mm/px · 1 of 38 slices shown]
[im 1/38]
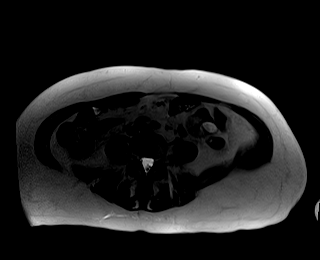

[Series 33: cor_vibe_dixon_delayed_w · coronal · 3.0mm · 2.34mm/px · 3 of 96 slices shown (1 of 2)]
[im 1/96]
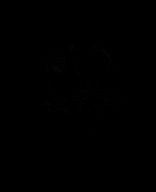
[im 48/96]
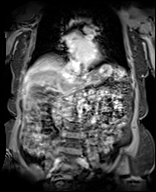
[im 96/96]
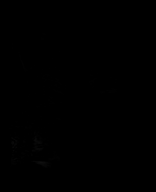

[Series 36: ax_dixon_delayed_w · axial · 3.0mm · 1.25mm/px · z∈[-151,+86]mm · 3 of 80 slices shown]
[im 1/80]
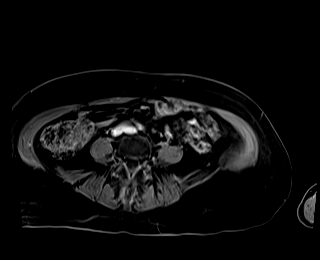
[im 40/80]
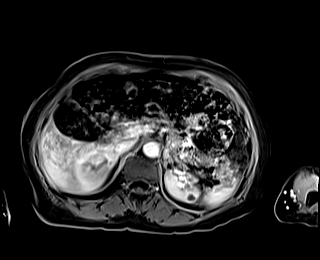
[im 80/80]
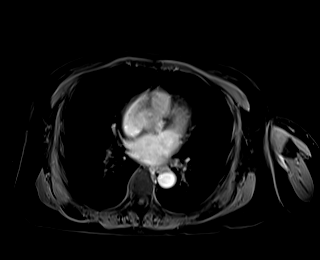

[Series 37: ax_dixon_delayed_w_sub · axial · 3.0mm · 1.25mm/px · z∈[-151,+86]mm · 3 of 80 slices shown]
[im 1/80]
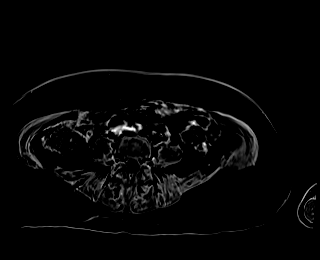
[im 40/80]
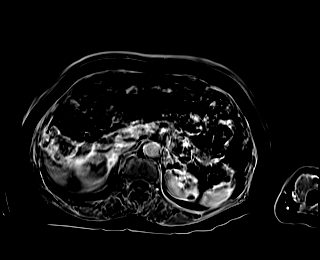
[im 80/80]
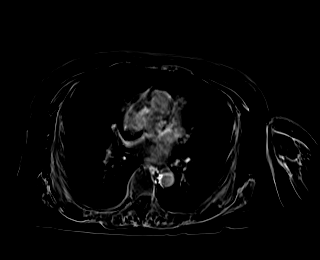

[Series 40: cor_vibe_dixon_delayed_w · coronal · 3.0mm · 2.34mm/px · 3 of 96 slices shown (2 of 2)]
[im 1/96]
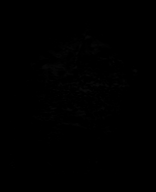
[im 48/96]
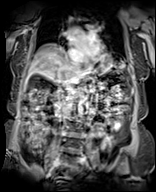
[im 96/96]
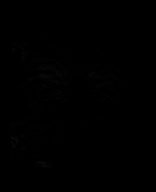

[45 of 48 positions shown; findings below may reference images not displayed]

FINDINGS: The pre contrast images are mildly motion degraded. The pre and
postcontrast dynamic age images are severely motion degraded,
essentially nondiagnostic. Per technologist notes, the patient does
not speak English and had difficulty with breath holds.

Lower chest: Normal heart size without pericardial or pleural
effusion.

Hepatobiliary: No noncontrast MRI correlate for the area of
hyperenhancement within the hepatic dome. No cirrhosis. No
suspicious liver lesion. Normal gallbladder, without biliary ductal
dilatation.

Pancreas: No pancreatic duct dilatation or pancreatic mass. The CT
abnormality was secondary to focal fatty replacement within the
head. No pancreatitis.

Spleen:  Normal in size, without focal abnormality.

Adrenals/Urinary Tract: Normal adrenal glands. An upper pole left
renal lesion of 8 mm is incompletely characterized but likely a
cyst. Normal right kidney.

Stomach/Bowel: Normal stomach and abdominal bowel loops.

Vascular/Lymphatic: Normal aortic caliber. No abdominal adenopathy.

Other:  No ascites.

Musculoskeletal: No acute osseous abnormality.
IMPRESSION: 1. Moderate to markedly motion degraded exam, as detailed above. The
pre and postcontrast dynamic images are essentially nondiagnostic.
2. No noncontrast MRI correlate for the CT abnormality within the
hepatic dome. This most likely represents a perfusion anomaly. If
history of primary malignancy or liver disease, this could be
re-evaluated with multiphase liver CT at 3-6 months.
3. The pancreatic abnormality represents fatty replacement within
the head. No pancreatic mass or acute pancreatitis.
4.  No acute abdominal process.

## 2020-08-20 MED ORDER — GADOBUTROL 1 MMOL/ML IV SOLN
6.0000 mL | Freq: Once | INTRAVENOUS | Status: AC | PRN
  Administered 2020-08-20: 6 mL via INTRAVENOUS

## 2020-08-24 ENCOUNTER — Other Ambulatory Visit: Payer: Self-pay

## 2020-09-09 ENCOUNTER — Other Ambulatory Visit: Payer: Self-pay | Admitting: Student

## 2020-09-09 ENCOUNTER — Other Ambulatory Visit: Payer: Self-pay

## 2020-09-09 MED FILL — Apixaban Tab 5 MG: ORAL | 30 days supply | Qty: 60 | Fill #0 | Status: CN

## 2020-09-09 MED FILL — Pantoprazole Sodium EC Tab 40 MG (Base Equiv): ORAL | 30 days supply | Qty: 60 | Fill #0 | Status: AC

## 2020-09-10 ENCOUNTER — Other Ambulatory Visit: Payer: Self-pay

## 2020-09-12 ENCOUNTER — Other Ambulatory Visit: Payer: Self-pay

## 2020-09-12 MED FILL — Amiodarone HCl Tab 200 MG: ORAL | 30 days supply | Qty: 30 | Fill #0 | Status: AC

## 2020-09-16 DIAGNOSIS — R1311 Dysphagia, oral phase: Secondary | ICD-10-CM | POA: Diagnosis not present

## 2020-09-16 DIAGNOSIS — J386 Stenosis of larynx: Secondary | ICD-10-CM | POA: Diagnosis not present

## 2020-09-16 DIAGNOSIS — Z930 Tracheostomy status: Secondary | ICD-10-CM | POA: Diagnosis not present

## 2020-09-16 DIAGNOSIS — K219 Gastro-esophageal reflux disease without esophagitis: Secondary | ICD-10-CM | POA: Diagnosis not present

## 2020-09-19 ENCOUNTER — Other Ambulatory Visit: Payer: Self-pay

## 2020-09-19 MED FILL — Apixaban Tab 5 MG: ORAL | 30 days supply | Qty: 60 | Fill #0 | Status: AC

## 2020-09-19 MED FILL — Levothyroxine Sodium Tab 75 MCG: ORAL | 30 days supply | Qty: 30 | Fill #0 | Status: AC

## 2020-10-15 ENCOUNTER — Other Ambulatory Visit: Payer: Self-pay | Admitting: Cardiology

## 2020-10-15 ENCOUNTER — Other Ambulatory Visit: Payer: Self-pay

## 2020-10-15 MED ORDER — AMIODARONE HCL 200 MG PO TABS
200.0000 mg | ORAL_TABLET | Freq: Every day | ORAL | 3 refills | Status: DC
  Filled 2020-10-15: qty 90, 90d supply, fill #0
  Filled 2020-12-11: qty 60, 60d supply, fill #1
  Filled 2021-02-11: qty 90, 90d supply, fill #1
  Filled 2021-05-12: qty 90, 90d supply, fill #2
  Filled 2021-08-08: qty 90, 90d supply, fill #0

## 2020-10-15 MED FILL — Levothyroxine Sodium Tab 75 MCG: ORAL | 30 days supply | Qty: 30 | Fill #1 | Status: AC

## 2020-10-15 MED FILL — Apixaban Tab 5 MG: ORAL | 30 days supply | Qty: 60 | Fill #1 | Status: AC

## 2020-10-30 ENCOUNTER — Other Ambulatory Visit: Payer: Self-pay

## 2020-10-30 MED FILL — Pantoprazole Sodium EC Tab 40 MG (Base Equiv): ORAL | 30 days supply | Qty: 60 | Fill #1 | Status: AC

## 2020-11-18 ENCOUNTER — Other Ambulatory Visit: Payer: Self-pay

## 2020-11-18 DIAGNOSIS — J383 Other diseases of vocal cords: Secondary | ICD-10-CM | POA: Diagnosis not present

## 2020-11-18 DIAGNOSIS — Z930 Tracheostomy status: Secondary | ICD-10-CM | POA: Diagnosis not present

## 2020-11-18 DIAGNOSIS — J386 Stenosis of larynx: Secondary | ICD-10-CM | POA: Diagnosis not present

## 2020-11-18 MED FILL — Apixaban Tab 5 MG: ORAL | 30 days supply | Qty: 60 | Fill #2 | Status: AC

## 2020-11-18 MED FILL — Levothyroxine Sodium Tab 75 MCG: ORAL | 30 days supply | Qty: 30 | Fill #2 | Status: AC

## 2020-12-04 ENCOUNTER — Other Ambulatory Visit (HOSPITAL_COMMUNITY)
Admission: RE | Admit: 2020-12-04 | Discharge: 2020-12-04 | Disposition: A | Discharge status: Home / Self Care | Payer: Medicare Other | Source: Ambulatory Visit | Attending: Family Medicine | Admitting: Family Medicine

## 2020-12-04 DIAGNOSIS — Z01812 Encounter for preprocedural laboratory examination: Secondary | ICD-10-CM | POA: Diagnosis not present

## 2020-12-04 DIAGNOSIS — Z20822 Contact with and (suspected) exposure to covid-19: Secondary | ICD-10-CM | POA: Diagnosis not present

## 2020-12-04 LAB — SARS CORONAVIRUS 2 (TAT 6-24 HRS): SARS Coronavirus 2: NEGATIVE

## 2020-12-05 ENCOUNTER — Other Ambulatory Visit: Payer: Self-pay

## 2020-12-06 ENCOUNTER — Telehealth: Payer: Self-pay

## 2020-12-06 ENCOUNTER — Other Ambulatory Visit: Payer: Self-pay

## 2020-12-06 ENCOUNTER — Ambulatory Visit (HOSPITAL_COMMUNITY)
Admission: RE | Admit: 2020-12-06 | Discharge: 2020-12-06 | Disposition: A | Discharge status: Home / Self Care | Payer: Medicare Other | Source: Ambulatory Visit | Attending: Acute Care | Admitting: Acute Care

## 2020-12-06 DIAGNOSIS — Z930 Tracheostomy status: Secondary | ICD-10-CM | POA: Diagnosis not present

## 2020-12-06 DIAGNOSIS — J386 Stenosis of larynx: Secondary | ICD-10-CM | POA: Insufficient documentation

## 2020-12-06 DIAGNOSIS — Z430 Encounter for attention to tracheostomy: Secondary | ICD-10-CM | POA: Insufficient documentation

## 2020-12-06 NOTE — Progress Notes (Signed)
[facility]   Reason for visit:  Planned trach change  HPI:  Known to me. 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 Still has evidence of obstruction in spite of prior attempts at tracheal dilation. Presents today for planned trach change  ROS  History obtained from unobtainable from patient due to language barrier and was obtained via the pt's DIL and interpriter  General ROS: negative for - chills, fatigue, fever, malaise, night sweats, or sleep disturbance ENT ROS: negative for - epistaxis, nasal congestion, nasal discharge, oral lesions, sinus pain, sneezing, sore throat, or trach site drainage Allergy and Immunology ROS: negative for - itchy/watery eyes, nasal congestion, postnasal drip, or seasonal allergies Respiratory ROS: positive for - shortness of breath and exertional dyspnea w/ PMV in place.  negative for - cough, hemoptysis, pleuritic pain, sputum changes, tachypnea, or wheezing Cardiovascular ROS: no chest pain or dyspnea on exertion Musculoskeletal ROS: negative Neurological ROS: no TIA or stroke symptoms Dermatological ROS: negative Vital signs:  Reviewed: Pulse Ox mid 90s Exam:  no distress ENT exam normal, no neck nodes or sinus tenderness trach site is unremarkable but did not sig rush of air with trach PMV removal clear to auscultation bilaterally regular rate and rhythm Abdomen soft and nontender without distention, masses , no wound infection noted. 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   Discussion  She still has some degree of  obstruction as evidenced by not tolerating occlusive capping trial AND rush of air w/ PMV removal which suggest air trapping. Because of this I do not think she is a candidate for decannulation. In addition to the above I have learned that she will be headed to [place] in the next week. Because of this I have provided her with an extra trach and PMV and instructed her to have this on her at all times in case her trach were to become occluded.  Plan  ROV 12 weeks Cont routine trach care Have instructed her to remove the PMV with exertion and continue to remove it at night. I think removing the trach w/ exertion should help with her activity tolerance     Visit time: 34 minutes.   Erick Colace ACNP-BC [place]

## 2020-12-06 NOTE — Telephone Encounter (Signed)
-----   Message from Charlott Rakes, MD sent at 12/05/2020  9:29 AM EDT ----- Please inform her that her COVID test is negative. Thanks

## 2020-12-06 NOTE — Progress Notes (Signed)
Tracheostomy Procedure Note  Jamie Mitchell [id number] 05/25/1946  Pre Procedure Tracheostomy Information  Trach Brand: Shiley Size:  4.0 Style: Uncuffed Secured by: Velcro   Procedure: Trach change/ Trach cleaning and Trach education    Post Procedure Tracheostomy Information  Trach Brand: Shiley Size:  4.0 Style: Uncuffed Secured by: Velcro   Post Procedure Evaluation:  ETCO2 positive color change from yellow to purple : Yes.   Vital signs:VSS Patients current condition: stable Complications: No apparent complications Trach site exam: clean and dry Wound care done: 4 x 4 gauze drain Patient did tolerate procedure well.   Education: Correct way to wear PMV valve along with attachment to hold PMV to trach collar  Prescription needs: none    Additional needs: New PMV  given at this time Pt plans to travel to [place] so patient was given an additional trach, PMV, lube, dale collar and gauze to carry on trip.

## 2020-12-06 NOTE — Telephone Encounter (Signed)
Patient name and DOB has been verified Patient was informed of lab results. Patient had no questions.  

## 2020-12-11 ENCOUNTER — Other Ambulatory Visit: Payer: Self-pay | Admitting: Critical Care Medicine

## 2020-12-11 ENCOUNTER — Other Ambulatory Visit: Payer: Self-pay

## 2020-12-11 MED ORDER — DICLOFENAC SODIUM 1 % EX GEL
2.0000 g | Freq: Four times a day (QID) | CUTANEOUS | 4 refills | Status: DC
  Filled 2020-12-11: qty 100, 12d supply, fill #0

## 2020-12-11 MED FILL — Levothyroxine Sodium Tab 75 MCG: ORAL | 60 days supply | Qty: 60 | Fill #3 | Status: AC

## 2020-12-11 MED FILL — Apixaban Tab 5 MG: ORAL | 60 days supply | Qty: 120 | Fill #3 | Status: AC

## 2020-12-13 DIAGNOSIS — Z20822 Contact with and (suspected) exposure to covid-19: Secondary | ICD-10-CM | POA: Diagnosis not present

## 2020-12-18 ENCOUNTER — Other Ambulatory Visit: Payer: Self-pay

## 2020-12-24 ENCOUNTER — Other Ambulatory Visit: Payer: Self-pay

## 2021-02-07 ENCOUNTER — Other Ambulatory Visit: Payer: Self-pay

## 2021-02-07 ENCOUNTER — Ambulatory Visit (INDEPENDENT_AMBULATORY_CARE_PROVIDER_SITE_OTHER): Payer: Medicare Other | Admitting: Internal Medicine

## 2021-02-07 VITALS — BP 138/80 | HR 59 | Ht 60.0 in | Wt 129.6 lb

## 2021-02-07 DIAGNOSIS — I63412 Cerebral infarction due to embolism of left middle cerebral artery: Secondary | ICD-10-CM | POA: Diagnosis not present

## 2021-02-07 DIAGNOSIS — Z79899 Other long term (current) drug therapy: Secondary | ICD-10-CM | POA: Diagnosis not present

## 2021-02-07 DIAGNOSIS — I480 Paroxysmal atrial fibrillation: Secondary | ICD-10-CM | POA: Diagnosis not present

## 2021-02-07 NOTE — Patient Instructions (Signed)
Medication Instructions:  Your physician recommends that you continue on your current medications as directed. Please refer to the Current Medication list given to you today.  *If you need a refill on your cardiac medications before your next appointment, please call your pharmacy*   Lab Work: Today: TSH & LFT  If you have labs (blood work) drawn today and your tests are completely normal, you will receive your results only by: [place] (if you have MyChart) OR A paper copy in the mail If you have any lab test that is abnormal or we need to change your treatment, we will call you to review the results.   Testing/Procedures: None ordered   Follow-Up: At [facility], you and your health needs are our priority.  As part of our continuing mission to provide you with exceptional heart care, we have created designated Provider Care Teams.  These Care Teams include your primary Cardiologist (physician) and Advanced Practice Providers (APPs -  Physician Assistants and Nurse Practitioners) who all work together to provide you with the care you need, when you need it.  We recommend signing up for the patient portal called "MyChart".  Sign up information is provided on this After Visit Summary.  MyChart is used to connect with patients for Virtual Visits (Telemedicine).  Patients are able to view lab/test results, encounter notes, upcoming appointments, etc.  Non-urgent messages can be sent to your provider as well.   To learn more about what you can do with MyChart, go to [email or web address].    Your next appointment:   1 year(s)  The format for your next appointment:   In Person  Provider:   Cristopher Peru, MD    Thank you for choosing [facility]!!     Other Instructions

## 2021-02-07 NOTE — Progress Notes (Signed)
HPI Ms. Venegas returns today for followup. She is a pleasant 75 yo woman with a h/o PAF, sinus node dysfunction and an indwelling tracheostomy. She has had documented sinus bradycardia though minimally symptomatic. She has not had syncope. Her AV conduction is good. She notes that her minimal symptoms have improved since she reduced her dose of amiodarone to 100 mg daily. She has rare palpitations. Overall she feels well.  No Known Allergies   Current Outpatient Medications  Medication Sig Dispense Refill   amiodarone (PACERONE) 200 MG tablet Take 1 tablet (200 mg total) by mouth daily. 90 tablet 3   atorvastatin (LIPITOR) 40 MG tablet Take 1 tablet (40 mg total) by mouth every evening. 90 tablet 3   diclofenac Sodium (VOLTAREN) 1 % GEL Apply 2 g topically 4 (four) times daily. 50 g 4   ELIQUIS 5 MG TABS tablet TAKE 1 TABLET (5 MG TOTAL) BY MOUTH 2 (TWO) TIMES DAILY. 60 tablet 6   EPINEPHrine 0.3 mg/0.3 mL IJ SOAJ injection Inject 0.3 mLs (0.3 mg total) into the muscle as needed for anaphylaxis. 1 each 1   levothyroxine (SYNTHROID) 75 MCG tablet TAKE 1 TABLET (75 MCG TOTAL) BY MOUTH DAILY BEFORE BREAKFAST. 30 tablet 6   pantoprazole (PROTONIX) 40 MG tablet TAKE 1 TABLET (40 MG TOTAL) BY MOUTH 2 (TWO) TIMES DAILY BEFORE A MEAL. 60 tablet 6   Pseudoeph-Doxylamine-DM-APAP (NYQUIL PO) Take 1 Dose by mouth at bedtime as needed (cough).     Vitamin D, Ergocalciferol, (DRISDOL) 1.25 MG (50000 UNIT) CAPS capsule TAKE 1 CAPSULE (50,000 UNITS TOTAL) BY MOUTH ONCE A WEEK. 12 capsule 0   No current facility-administered medications for this visit.     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   Past Surgical History:   Procedure Laterality Date   IR CT HEAD LTD  07/20/2019   IR PERCUTANEOUS ART THROMBECTOMY/INFUSION INTRACRANIAL INC DIAG ANGIO  07/20/2019   KNEE SURGERY Left    RADIOLOGY WITH ANESTHESIA N/A 07/20/2019   Procedure: IR WITH ANESTHESIA;  Surgeon: Luanne Bras, MD;  Location: [facility];  Service: Radiology;  Laterality: N/A;   TRACHEOSTOMY TUBE PLACEMENT N/A 12/27/2019   Procedure: TRACHEOSTOMY;  Surgeon: Izora Gala, MD;  Location: [facility] OR;  Service: ENT;  Laterality: N/A;     Family History  Problem Relation Age of Onset   Birth defects Daughter        feet   Heart disease Sister      Social History   Socioeconomic History   Marital status: Married    Spouse name: Not on file   Number of children: 33   Years of education: Not on file   Highest education level: Not on file  Occupational History   Not on file  Tobacco Use   Smoking status: Never   Smokeless tobacco: Never  Vaping Use   Vaping Use: Never used  Substance and Sexual Activity   Alcohol use: No   Drug use: No   Sexual activity: Not Currently    Birth control/protection: None    Comment: Married  Other Topics Concern   Not on file  Social History  Narrative   Lives with son [name])    Social Determinants of Health   Financial Resource Strain: Not on file  Food Insecurity: Not on file  Transportation Needs: Not on file  Physical Activity: Not on file  Stress: Not on file  Social Connections: Not on file  Intimate Partner Violence: Not on file     BP 138/80   Pulse (!) 59   Ht 5' (1.524 m)   Wt 129 lb 9.6 oz (58.8 kg)   SpO2 98%   BMI 25.31 kg/m   Physical Exam:  Well appearing NAD HEENT: Unremarkable Neck:  No JVD, no thyromegally Lymphatics:  No adenopathy Back:  No CVA tenderness Lungs:  Clear with no wheezes HEART:  Regular rate rhythm, no murmurs, no rubs, no clicks Abd:  soft, positive bowel sounds, no organomegally, no rebound, no guarding Ext:  2 plus pulses, no edema, no cyanosis,  no clubbing Skin:  No rashes no nodules Neuro:  CN II through XII intact, motor grossly intact  EKG - sinus brady at 59/min  DEVICE  Normal device function.  See PaceArt for details.   Assess/Plan:  1. PAF - she is mostly maintaining NSR and she will continue low dose amiodarone. 2. Sinus node dysfunction - She is a very poor candidate for PPM and she has not had syncope. She may have to live with episodes of atrial fib. Her indwelling and permanent tracheostomy will limit her pacing options. Because of sinus node dysfunction, she is not a good candidate for Micra 3. Indwelling tracheostomy - her long term prognosis is not good as these patients frequently get pneumonia. 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 4. HTN - her bp is well controlled today. She will continue her current meds.   Carleene Overlie Lexxie Winberg,MD

## 2021-02-08 LAB — HEPATIC FUNCTION PANEL
ALT: 15 IU/L (ref 0–32)
AST: 20 IU/L (ref 0–40)
Albumin: 4.2 g/dL (ref 3.7–4.7)
Alkaline Phosphatase: 99 IU/L (ref 44–121)
Bilirubin Total: 0.4 mg/dL (ref 0.0–1.2)
Bilirubin, Direct: 0.14 mg/dL (ref 0.00–0.40)
Total Protein: 7.1 g/dL (ref 6.0–8.5)

## 2021-02-08 LAB — TSH: TSH: 2.25 u[IU]/mL (ref 0.450–4.500)

## 2021-02-11 ENCOUNTER — Other Ambulatory Visit: Payer: Self-pay

## 2021-02-11 ENCOUNTER — Other Ambulatory Visit: Payer: Self-pay | Admitting: Critical Care Medicine

## 2021-02-11 MED FILL — Apixaban Tab 5 MG: ORAL | 60 days supply | Qty: 120 | Fill #4 | Status: AC

## 2021-02-11 MED FILL — Levothyroxine Sodium Tab 75 MCG: ORAL | 30 days supply | Qty: 30 | Fill #4 | Status: AC

## 2021-02-11 NOTE — Telephone Encounter (Signed)
Requested medication (s) are due for refill today: yes  Requested medication (s) are on the active medication list: yes  Last refill:  01/15/20 #90 3 RF  Future visit scheduled: no  Notes to clinic:  overdue labs   Requested Prescriptions  Pending Prescriptions Disp Refills   atorvastatin (LIPITOR) 40 MG tablet 90 tablet 3    Sig: Take 1 tablet (40 mg total) by mouth every evening.     Cardiovascular:  Antilipid - Statins Failed - 02/11/2021  4:40 PM      Failed - Total Cholesterol in normal range and within 360 days    Cholesterol, Total  Date Value Ref Range Status  02/26/2017 137 100 - 199 mg/dL Final   Cholesterol  Date Value Ref Range Status  07/20/2019 113 0 - 200 mg/dL Final          Failed - LDL in normal range and within 360 days    LDL Calculated  Date Value Ref Range Status  02/26/2017 49 0 - 99 mg/dL Final   LDL Cholesterol  Date Value Ref Range Status  07/20/2019 66 0 - 99 mg/dL Final    Comment:           Total Cholesterol/HDL:CHD Risk Coronary Heart Disease Risk Table                     Men   Women  1/2 Average Risk   3.4   3.3  Average Risk       5.0   4.4  2 X Average Risk   9.6   7.1  3 X Average Risk  23.4   11.0        Use the calculated Patient Ratio above and the CHD Risk Table to determine the patient's CHD Risk.        ATP III CLASSIFICATION (LDL):  <100     mg/dL   Optimal  100-129  mg/dL   Near or Above                    Optimal  130-159  mg/dL   Borderline  160-189  mg/dL   High  >190     mg/dL   Very High Performed at [facility] [address] [place], [place] [address]    Direct LDL  Date Value Ref Range Status  02/20/2016 78 <130 mg/dL Final    Comment:      Desirable range <100 mg/dL for patients with CHD or diabetes and <70 mg/dL for diabetic patients with known heart disease.             Failed - HDL in normal range and within 360 days    HDL  Date Value Ref Range Status  07/20/2019 36 (L) >40 mg/dL  Final  02/26/2017 35 (L) >39 mg/dL Final          Failed - Triglycerides in normal range and within 360 days    Triglycerides  Date Value Ref Range Status  12/28/2019 73 <150 mg/dL Final    Comment:    Performed at [facility], [place] [address] [place], [place] [address]          Passed - Patient is not pregnant      Passed - Valid encounter within last 12 months    Recent Outpatient Visits           6 months ago Type 2 diabetes mellitus with other circulatory  complication, without long-term current use of insulin ([place])   [place], [place], MD   10 months ago Type 2 diabetes mellitus with other circulatory complication, without long-term current use of insulin ([place])   [place], [place], MD   12 months ago Non-intractable vomiting with nausea, unspecified vomiting type   [place], [place], MD   1 year ago Subglottic edema   [place] Elsie Stain, MD   1 year ago Encounter for screening mammogram for malignant neoplasm of breast   [place] [facility] Charlott Rakes, MD

## 2021-02-12 ENCOUNTER — Other Ambulatory Visit: Payer: Self-pay

## 2021-02-13 ENCOUNTER — Other Ambulatory Visit: Payer: Self-pay

## 2021-02-13 MED ORDER — ATORVASTATIN CALCIUM 40 MG PO TABS
40.0000 mg | ORAL_TABLET | Freq: Every evening | ORAL | 0 refills | Status: DC
  Filled 2021-02-13: qty 30, 30d supply, fill #0

## 2021-02-18 ENCOUNTER — Other Ambulatory Visit: Payer: Self-pay

## 2021-03-05 ENCOUNTER — Other Ambulatory Visit: Payer: Self-pay

## 2021-03-05 DIAGNOSIS — J383 Other diseases of vocal cords: Secondary | ICD-10-CM | POA: Diagnosis not present

## 2021-03-05 DIAGNOSIS — H6690 Otitis media, unspecified, unspecified ear: Secondary | ICD-10-CM | POA: Diagnosis not present

## 2021-03-05 DIAGNOSIS — Z930 Tracheostomy status: Secondary | ICD-10-CM | POA: Diagnosis not present

## 2021-03-05 DIAGNOSIS — H6692 Otitis media, unspecified, left ear: Secondary | ICD-10-CM | POA: Diagnosis not present

## 2021-03-05 MED ORDER — CIPROFLOXACIN HCL 500 MG PO TABS
ORAL_TABLET | ORAL | 0 refills | Status: DC
  Filled 2021-03-05: qty 20, 10d supply, fill #0

## 2021-03-05 MED ORDER — CIPROFLOXACIN-DEXAMETHASONE 0.3-0.1 % OT SUSP
OTIC | 0 refills | Status: DC
  Filled 2021-03-05: qty 7.5, 15d supply, fill #0

## 2021-03-06 ENCOUNTER — Other Ambulatory Visit: Payer: Self-pay

## 2021-03-07 ENCOUNTER — Other Ambulatory Visit: Payer: Self-pay

## 2021-03-17 ENCOUNTER — Other Ambulatory Visit: Payer: Self-pay | Admitting: Family Medicine

## 2021-03-17 DIAGNOSIS — E038 Other specified hypothyroidism: Secondary | ICD-10-CM

## 2021-03-17 MED ORDER — LEVOTHYROXINE SODIUM 75 MCG PO TABS
ORAL_TABLET | ORAL | 6 refills | Status: DC
  Filled 2021-03-17: qty 30, 30d supply, fill #0
  Filled 2021-04-16: qty 30, 30d supply, fill #1
  Filled 2021-05-12: qty 90, 90d supply, fill #2
  Filled 2021-08-08: qty 60, 60d supply, fill #0

## 2021-03-17 NOTE — Telephone Encounter (Signed)
Requested Prescriptions  Pending Prescriptions Disp Refills  . levothyroxine (SYNTHROID) 75 MCG tablet 30 tablet 6    Sig: TAKE 1 TABLET (75 MCG TOTAL) BY MOUTH DAILY BEFORE BREAKFAST.     Endocrinology:  Hypothyroid Agents Failed - 03/17/2021  2:14 PM      Failed - TSH needs to be rechecked within 3 months after an abnormal result. Refill until TSH is due.      Passed - TSH in normal range and within 360 days    TSH  Date Value Ref Range Status  02/07/2021 2.250 0.450 - 4.500 uIU/mL Final         Passed - Valid encounter within last 12 months    Recent Outpatient Visits          7 months ago Type 2 diabetes mellitus with other circulatory complication, without long-term current use of insulin ([place])   [place], [place], MD   12 months ago Type 2 diabetes mellitus with other circulatory complication, without long-term current use of insulin ([place])   [place], Charlane Ferretti, MD   1 year ago Non-intractable vomiting with nausea, unspecified vomiting type   [place], [place], MD   1 year ago Subglottic edema   [place] Elsie Stain, MD   1 year ago Encounter for screening mammogram for malignant neoplasm of breast   [place], Charlane Ferretti, MD             . atorvastatin (LIPITOR) 40 MG tablet 30 tablet 0    Sig: Take 1 tablet (40 mg total) by mouth every evening.     Cardiovascular:  Antilipid - Statins Failed - 03/17/2021  2:14 PM      Failed - Total Cholesterol in normal range and within 360 days    Cholesterol, Total  Date Value Ref Range Status  02/26/2017 137 100 - 199 mg/dL Final   Cholesterol  Date Value Ref Range Status  07/20/2019 113 0 - 200 mg/dL Final         Failed - LDL in normal range and within 360 days    LDL Calculated  Date Value Ref Range Status  02/26/2017 49 0 - 99  mg/dL Final   LDL Cholesterol  Date Value Ref Range Status  07/20/2019 66 0 - 99 mg/dL Final    Comment:           Total Cholesterol/HDL:CHD Risk Coronary Heart Disease Risk Table                     Men   Women  1/2 Average Risk   3.4   3.3  Average Risk       5.0   4.4  2 X Average Risk   9.6   7.1  3 X Average Risk  23.4   11.0        Use the calculated Patient Ratio above and the CHD Risk Table to determine the patient's CHD Risk.        ATP III CLASSIFICATION (LDL):  <100     mg/dL   Optimal  100-129  mg/dL   Near or Above                    Optimal  130-159  mg/dL   Borderline  160-189  mg/dL   High  >190  mg/dL   Very High Performed at [facility], [place] [address], [place], [place] [address]    Direct LDL  Date Value Ref Range Status  02/20/2016 78 <130 mg/dL Final    Comment:      Desirable range <100 mg/dL for patients with CHD or diabetes and <70 mg/dL for diabetic patients with known heart disease.            Failed - HDL in normal range and within 360 days    HDL  Date Value Ref Range Status  07/20/2019 36 (L) >40 mg/dL Final  02/26/2017 35 (L) >39 mg/dL Final         Failed - Triglycerides in normal range and within 360 days    Triglycerides  Date Value Ref Range Status  12/28/2019 73 <150 mg/dL Final    Comment:    Performed at [facility], [place] [address], [place], [place] [address]         Passed - Patient is not pregnant      Passed - Valid encounter within last 12 months    Recent Outpatient Visits          7 months ago Type 2 diabetes mellitus with other circulatory complication, without long-term current use of insulin ([place])   [place], [place], MD   12 months ago Type 2 diabetes mellitus with other circulatory complication, without long-term current use of insulin ([place])   [place], Charlane Ferretti, MD   1 year ago Non-intractable vomiting with  nausea, unspecified vomiting type   [place], Enobong, MD   1 year ago Subglottic edema   [place] Elsie Stain, MD   1 year ago Encounter for screening mammogram for malignant neoplasm of breast   [facility] Charlott Rakes, MD

## 2021-03-17 NOTE — Telephone Encounter (Signed)
Requested medications are due for refill today.  yes  Requested medications are on the active medications list.  yes  Last refill. 02/13/2021  Future visit scheduled.   no  Notes to clinic.  Labs are expired.

## 2021-03-18 ENCOUNTER — Other Ambulatory Visit: Payer: Self-pay

## 2021-03-20 ENCOUNTER — Other Ambulatory Visit: Payer: Self-pay

## 2021-03-20 MED ORDER — ATORVASTATIN CALCIUM 40 MG PO TABS
40.0000 mg | ORAL_TABLET | Freq: Every evening | ORAL | 0 refills | Status: DC
  Filled 2021-03-20: qty 30, 30d supply, fill #0

## 2021-03-24 ENCOUNTER — Other Ambulatory Visit: Payer: Self-pay | Admitting: Internal Medicine

## 2021-03-24 LAB — SARS CORONAVIRUS 2 (TAT 6-24 HRS): SARS Coronavirus 2: NEGATIVE

## 2021-03-25 ENCOUNTER — Ambulatory Visit (HOSPITAL_COMMUNITY)
Admission: RE | Admit: 2021-03-25 | Discharge: 2021-03-25 | Disposition: A | Discharge status: Home / Self Care | Payer: Medicare Other | Source: Ambulatory Visit | Attending: Acute Care | Admitting: Acute Care

## 2021-03-25 DIAGNOSIS — Z930 Tracheostomy status: Secondary | ICD-10-CM

## 2021-03-25 DIAGNOSIS — J386 Stenosis of larynx: Secondary | ICD-10-CM | POA: Insufficient documentation

## 2021-03-25 NOTE — Progress Notes (Signed)
Tracheostomy Procedure Note  Caela Huot [id number] [birth year]  Pre Procedure Tracheostomy Information  Trach Brand: Shiley Size:  4.0 Style: Uncuffed Secured by: Velcro   Procedure: Trach change and Trach cleaning    Post Procedure Tracheostomy Information  Trach Brand: Shiley Size:  4.0 Style: Uncuffed Secured by: Velcro   Post Procedure Evaluation:  ETCO2 positive color change from yellow to purple : Yes.   Vital signs:VSS Patients current condition: stable Complications: No apparent complications Trach site exam: clean, dry Wound care done: 4 x 4 gauze drain Patient did tolerate procedure well.   Education: Wound bandage for trach site instructions with daughter  Prescription needs: none    Additional needs: New  PMV given at this visit and patient given 6 wound bandages to change every 5 day at home

## 2021-03-25 NOTE — Progress Notes (Signed)
[facility]   Reason for visit:  Trach change  HPI:  Well known to me. 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 Presents today for planned trach change. Only complaint is small amount of discomfort at the bottom of the trach stoma.  ROS  History obtained from the patient and spanish interpreter  General ROS: negative for - pain Psychological ROS: negative ENT ROS: negative Respiratory ROS: no cough, shortness of breath, or wheezing Cardiovascular ROS: no chest pain or dyspnea on exertion Gastrointestinal ROS: no abdominal pain, change in bowel habits, or black or bloody stools Musculoskeletal ROS: negative Neurological ROS: no TIA or stroke symptoms Vital signs:  Reviewed: Pulse Ox mid 90s  Exam:  no distress ENT exam normal, no neck nodes or sinus tenderness clear to auscultation bilaterally regular rate and rhythm Abdomen soft and nontender without distention, masses , no wound infection noted. extremities normal, atraumatic, no cyanosis or edema Alert and oriented x 3, gait normal., reflexes normal and symmetric, strength and  sensation grossly normal  Trach change/procedure:  trach change Shiley size 4 cuffless Wound appearance: some granulation at about 6 o'clock in relation to stoma  Placement confirmed w/ ETCO2      Impression/dx  Trach dependence  Subglottic stenosis  Vocal cord injury  Small ulceration r/t trach  Discussion  Doing well. Has some granulomatous tissue at bottom of trach stoma. I suspect this is from keeping the trach tie too loose.  Plan  Cont routine trach care Dressing applied to lower trach area for pressure relief and healing. Also provided several dressings to family. Instructed them to change weekly ROV 8 weeks    Visit time: 32 minutes.   Erick Colace ACNP-BC [place]

## 2021-04-09 ENCOUNTER — Other Ambulatory Visit: Payer: Self-pay | Admitting: Family Medicine

## 2021-04-09 ENCOUNTER — Other Ambulatory Visit: Payer: Self-pay

## 2021-04-09 DIAGNOSIS — I4891 Unspecified atrial fibrillation: Secondary | ICD-10-CM

## 2021-04-09 NOTE — Telephone Encounter (Signed)
Requested medications are due for refill today.  yes  Requested medications are on the active medications list.  yes  Last refill. Eliquis 07/31/2020, Lipitor 03/20/2021  Future visit scheduled.   no  Notes to clinic.  Pt already given a courtesy refill for 1 medication. Labs are expired and /or abnormal.

## 2021-04-10 MED ORDER — ELIQUIS 5 MG PO TABS
5.0000 mg | ORAL_TABLET | Freq: Two times a day (BID) | ORAL | 0 refills | Status: DC
  Filled 2021-04-10: qty 60, 30d supply, fill #0

## 2021-04-11 ENCOUNTER — Other Ambulatory Visit: Payer: Self-pay

## 2021-04-16 ENCOUNTER — Other Ambulatory Visit: Payer: Self-pay

## 2021-04-16 ENCOUNTER — Other Ambulatory Visit: Payer: Self-pay | Admitting: Family Medicine

## 2021-04-18 NOTE — Telephone Encounter (Signed)
Requested medication (s) are due for refill today: Yes  Requested medication (s) are on the active medication list: Yes  Last refill:  03/20/21  Future visit scheduled: No  Notes to clinic:  Unable to refill per protocol due to failed labs, no updated results. Pt also need to schedule appt     Requested Prescriptions  Pending Prescriptions Disp Refills   atorvastatin (LIPITOR) 40 MG tablet 30 tablet 0    Sig: Take 1 tablet (40 mg total) by mouth every evening. must have office visit for refills     Cardiovascular:  Antilipid - Statins Failed - 04/16/2021  3:26 PM      Failed - Total Cholesterol in normal range and within 360 days    Cholesterol, Total  Date Value Ref Range Status  02/26/2017 137 100 - 199 mg/dL Final   Cholesterol  Date Value Ref Range Status  07/20/2019 113 0 - 200 mg/dL Final          Failed - LDL in normal range and within 360 days    LDL Calculated  Date Value Ref Range Status  02/26/2017 49 0 - 99 mg/dL Final   LDL Cholesterol  Date Value Ref Range Status  07/20/2019 66 0 - 99 mg/dL Final    Comment:           Total Cholesterol/HDL:CHD Risk Coronary Heart Disease Risk Table                     Men   Women  1/2 Average Risk   3.4   3.3  Average Risk       5.0   4.4  2 X Average Risk   9.6   7.1  3 X Average Risk  23.4   11.0        Use the calculated Patient Ratio above and the CHD Risk Table to determine the patient's CHD Risk.        ATP III CLASSIFICATION (LDL):  <100     mg/dL   Optimal  100-129  mg/dL   Near or Above                    Optimal  130-159  mg/dL   Borderline  160-189  mg/dL   High  >190     mg/dL   Very High Performed at [place] [address] [place], [place] [address]    Direct LDL  Date Value Ref Range Status  02/20/2016 78 <130 mg/dL Final    Comment:      Desirable range <100 mg/dL for patients with CHD or diabetes and <70 mg/dL for diabetic patients with known heart disease.              Failed - HDL in normal range and within 360 days    HDL  Date Value Ref Range Status  07/20/2019 36 (L) >40 mg/dL Final  02/26/2017 35 (L) >39 mg/dL Final          Failed - Triglycerides in normal range and within 360 days    Triglycerides  Date Value Ref Range Status  12/28/2019 73 <150 mg/dL Final    Comment:    Performed at [facility], [place] [address] [place], [place] [address]          Passed - Patient is not pregnant      Passed - Valid encounter within last 12 months    Recent Outpatient  Visits           8 months ago Type 2 diabetes mellitus with other circulatory complication, without long-term current use of insulin ([place])   [place], [place], MD   1 year ago Type 2 diabetes mellitus with other circulatory complication, without long-term current use of insulin ([place])   [place], Charlane Ferretti, MD   1 year ago Non-intractable vomiting with nausea, unspecified vomiting type   [place], [place], MD   1 year ago Subglottic edema   [place] Elsie Stain, MD   1 year ago Encounter for screening mammogram for malignant neoplasm of breast   [facility] Charlott Rakes, MD

## 2021-04-22 ENCOUNTER — Other Ambulatory Visit: Payer: Self-pay

## 2021-04-22 ENCOUNTER — Other Ambulatory Visit: Payer: Self-pay | Admitting: Family Medicine

## 2021-04-24 ENCOUNTER — Other Ambulatory Visit: Payer: Self-pay

## 2021-04-24 ENCOUNTER — Ambulatory Visit: Payer: Medicare Other | Attending: Physician Assistant | Admitting: Physician Assistant

## 2021-04-24 ENCOUNTER — Encounter: Payer: Self-pay | Admitting: Physician Assistant

## 2021-04-24 VITALS — BP 136/76 | HR 56 | Temp 98.8°F | Resp 18 | Ht 61.5 in | Wt 135.0 lb

## 2021-04-24 DIAGNOSIS — Z930 Tracheostomy status: Secondary | ICD-10-CM | POA: Diagnosis not present

## 2021-04-24 DIAGNOSIS — I4891 Unspecified atrial fibrillation: Secondary | ICD-10-CM

## 2021-04-24 DIAGNOSIS — I10 Essential (primary) hypertension: Secondary | ICD-10-CM | POA: Diagnosis not present

## 2021-04-24 DIAGNOSIS — M1712 Unilateral primary osteoarthritis, left knee: Secondary | ICD-10-CM

## 2021-04-24 DIAGNOSIS — E7800 Pure hypercholesterolemia, unspecified: Secondary | ICD-10-CM | POA: Diagnosis not present

## 2021-04-24 DIAGNOSIS — E1159 Type 2 diabetes mellitus with other circulatory complications: Secondary | ICD-10-CM

## 2021-04-24 MED ORDER — ELIQUIS 5 MG PO TABS
5.0000 mg | ORAL_TABLET | Freq: Two times a day (BID) | ORAL | 1 refills | Status: DC
  Filled 2021-04-24 – 2021-08-08 (×3): qty 180, 90d supply, fill #0

## 2021-04-24 MED ORDER — ATORVASTATIN CALCIUM 40 MG PO TABS
40.0000 mg | ORAL_TABLET | Freq: Every evening | ORAL | 1 refills | Status: DC
Start: 2021-04-24 — End: 2021-10-22
  Filled 2021-04-24 – 2021-07-25 (×2): qty 90, 90d supply, fill #0

## 2021-04-24 MED ORDER — DICLOFENAC SODIUM 1 % EX GEL
2.0000 g | Freq: Four times a day (QID) | CUTANEOUS | 4 refills | Status: AC
  Filled 2021-04-24: qty 100, 12d supply, fill #0

## 2021-04-24 NOTE — Patient Instructions (Signed)
We will call you with your lab results   Kennieth Rad, PA-C Physician Assistant [facility] [email or web address]   Deficiencia de vitamina D Vitamin D Deficiency La deficiencia de vitamina D ocurre cuando el organismo no tiene suficiente vitamina D. La vitamina D es importante para el organismo por muchos motivos: Ayuda al organismo a Tax adviser calcio y fsforo, Sports administrator. Desempea un papel en la salud de los [place]. Puede ayudar a prevenir [employer], como la diabetes y Curator. Desempea un papel en la funcin muscular, incluida la actividad cardaca. Si la deficiencia de vitamina D es grave, puede causar una enfermedad que reblandece los [place]. En los adultos, se la conoce como osteomalacia. En los nios, recibe el nombre de raquitismo. Cules son las causas? Esta afeccin puede ser causada por lo siguiente: No comer suficientes alimentos que contengan vitamina D. No estar expuesto al sol lo suficiente. Sufrir ciertas enfermedades del aparato digestivo que le dificultan al organismo la absorcin de vitamina D. Estas enfermedades incluyen la enfermedad de Crohn, la pancreatitis crnica y la fibrosis [place]. Someterse a una [place] se extirpa Ardelia Mems parte del estmago o del intestino delgado. Tener enfermedad renal crnica o enfermedad heptica. Qu incrementa el riesgo? Es ms probable que desarrolle esta afeccin en los siguientes casos: Es de [place]. No pasa mucho tiempo al [employer]. Vive en un centro de atencin a Barrister's clerk. Ha tenido fracturas de [place]. Tiene huesos dbiles o finos (osteoporosis). Tiene una enfermedad o un trastorno que modifica la forma en que el organismo absorbe la vitamina D. Tiene piel oscura. Toma algunos medicamentos, como corticoesteroides o determinados anticonvulsivos. Tiene sobrepeso u obesidad. Cules son los signos o los  sntomas? En los casos leves de deficiencia de vitamina D, puede no haber sntomas. Si el trastorno es grave, los sntomas pueden incluir lo siguiente: [employer]. Dolor muscular. Caerse con frecuencia. Huesos fracturados por Primary school teacher. Cmo se diagnostica? Esta afeccin se puede diagnosticar con [employer]. Tambin se pueden Geophysical data processor de diagnstico por imgenes, como radiografas, para Solicitor. Cmo se trata? El tratamiento de esta afeccin puede depender de la causa. Las opciones de tratamiento incluyen las siguientes: Tomar suplementos de vitamina D. El Statistician cul es la dosis ms [place] para usted. Tomar un suplemento de calcio. El Statistician cul es la dosis ms [place] para usted. Siga estas instrucciones en su casa: Comida y bebida  Consuma alimentos que contengan vitamina D, como por ejemplo: Productos lcteos, cereales o jugos fortificados. El trmino fortificado significa que se ha aadido vitamina D al alimento. Revise la etiqueta del envase para ver si el alimento es fortificado. Pescados grasos, como el salmn o la [place]. Huevos. Ostras. Hong[place]. Es posible que los productos mencionados arriba no formen una lista completa de las bebidas o los alimentos recomendados. Consulte a un nutricionista para obtener ms informacin. Instrucciones generales [employer] y los suplementos solamente como se lo haya indicado el mdico. Reciba luz solar natural de forma regular y segura. No utilice camas solares. Mantenga un peso saludable. Baje de peso, si es necesario. Concurra a todas las visitas de seguimiento como se lo haya indicado el mdico. Esto es importante. Cmo se evita? Para obtener vitamina D, puede hacer lo siguiente: Consuma alimentos que contengan naturalmente vitamina D. Coma o beba productos que hayan sido fortificados con vitamina D, como cereales, jugos y productos lcteos  (  incluida la [place]). Tome un suplemento de vitamina D o un suplemento multivitamnico que la [place]. Expngase al sol. El organismo produce vitamina D de forma natural cuando se expone la piel a la luz del sol. El organismo transforma la luz solar en una forma de vitamina que puede [place]. Comunquese con un mdico si: Los sntomas no desaparecen. Siente nuseas o vomita. Tiene menos deposiciones de lo habitual o est estreido. Resumen La deficiencia de vitamina D ocurre cuando el organismo no tiene suficiente vitamina D. La vitamina D es importante para el organismo para la buena salud sea y la funcin muscular, y puede ayudar a prevenir algunas enfermedades. La deficiencia de vitamina D se trata principalmente con suplementos. El Statistician cul es la dosis ms [place] para usted. Puede obtener vitamina D al consumir alimentos que contengan vitamina D, al estar al sol y al tomar un suplemento de vitamina D o un suplemento multivitamnico que contenga vitamina D. Esta informacin no tiene Marine scientist el consejo del mdico. Asegrese de hacerle al mdico cualquier pregunta que tenga. Document Revised: 02/03/2018 Document Reviewed: 02/03/2018 Elsevier Patient Education  2022 [employer].

## 2021-04-24 NOTE — Progress Notes (Signed)
Established Patient Office Visit  Subjective:  Patient ID: Jamie Mitchell, female    DOB: March 29, 1946  Age: 75 y.o. MRN: [id number]  CC:  Chief Complaint  Patient presents with   Medication Refill    HPI Jamie Mitchell presents for medication refills .  Daughter is present and provides most of the history due to patient's condition.  States that she is doing well overall, states blood pressure readings at home are within normal limits.  Is being followed by cardiology for A. fib and otolaryngology for tracheostomy.  Due to language barrier, an interpreter was present during the history-taking and subsequent discussion (and for part of the physical exam) with this patient.    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   DM (diabetes mellitus) ([place])    HLD (hyperlipidemia)    Hypertension    Hypothyroidism    Renal failure 07/25/2019   Stroke [facility])     Past Surgical History:  Procedure Laterality Date   IR CT HEAD LTD  07/20/2019   IR PERCUTANEOUS ART THROMBECTOMY/INFUSION INTRACRANIAL INC DIAG ANGIO  07/20/2019   KNEE SURGERY Left    RADIOLOGY WITH ANESTHESIA N/A 07/20/2019   Procedure: IR WITH ANESTHESIA;  Surgeon: Luanne Bras, MD;  Location: [place];  Service: Radiology;  Laterality: N/A;   TRACHEOSTOMY TUBE PLACEMENT N/A 12/27/2019   Procedure: TRACHEOSTOMY;  Surgeon: Izora Gala, MD;  Location: [facility] OR;  Service: ENT;  Laterality: N/A;    Family History  Problem Relation Age of Onset   Birth defects Daughter        feet   Heart disease Sister     Social History   Socioeconomic History   Marital status: Married    Spouse name: Not on file   Number of children: 83   Years of education: Not on file   Highest education level: Not on file  Occupational History   Not on file  Tobacco Use   Smoking status: Never   Smokeless tobacco: Never  Vaping Use   Vaping Use: Never used   Substance and Sexual Activity   Alcohol use: No   Drug use: No   Sexual activity: Not Currently    Birth control/protection: None    Comment: Married  Other Topics Concern   Not on file  Social History Narrative   Lives with son Jamie Mitchell)    Social Determinants of Health   Financial Resource Strain: Not on file  Food Insecurity: Not on file  Transportation Needs: Not on file  Physical Activity: Not on file  Stress: Not on file  Social Connections: Not on file  Intimate Partner Violence: Not on file    Outpatient Medications Prior to Visit  Medication Sig Dispense Refill   amiodarone (PACERONE) 200 MG tablet Take 1 tablet (200 mg total) by mouth daily. 90 tablet 3   ciprofloxacin (CIPRO) 500 MG tablet Take 1 tablet (500 mg total) by mouth 2 times daily for 10 days. 20 tablet 0   ciprofloxacin-dexamethasone (CIPRODEX) OTIC suspension Place 4 drops into the left ear 2 times daily for 10 days. 7.5 mL 0   EPINEPHrine 0.3 mg/0.3 mL IJ SOAJ injection Inject 0.3 mLs (0.3 mg total) into the muscle as needed for anaphylaxis. 1 each 1   levothyroxine (SYNTHROID) 75 MCG tablet TAKE 1 TABLET (75 MCG TOTAL) BY MOUTH DAILY BEFORE BREAKFAST. 30 tablet 6   pantoprazole (PROTONIX) 40 MG tablet TAKE 1  TABLET (40 MG TOTAL) BY MOUTH 2 (TWO) TIMES DAILY BEFORE A MEAL. 60 tablet 6   Pseudoeph-Doxylamine-DM-APAP (NYQUIL PO) Take 1 Dose by mouth at bedtime as needed (cough).     atorvastatin (LIPITOR) 40 MG tablet Take 1 tablet (40 mg total) by mouth every evening. must have office visit for refills 30 tablet 0   diclofenac Sodium (VOLTAREN) 1 % GEL Apply 2 g topically 4 (four) times daily. 50 g 4   ELIQUIS 5 MG TABS tablet TAKE 1 TABLET (5 MG TOTAL) BY MOUTH 2 (TWO) TIMES DAILY. 60 tablet 0   No facility-administered medications prior to visit.    No Known Allergies  ROS Review of Systems  Constitutional: Negative.   HENT: Negative.    Eyes: Negative.   Respiratory:  Negative for shortness of  breath.   Cardiovascular:  Negative for chest pain.  Gastrointestinal: Negative.   Endocrine: Negative.   Genitourinary: Negative.   Musculoskeletal: Negative.   Skin: Negative.   Allergic/Immunologic: Negative.   Neurological: Negative.   Hematological: Negative.   Psychiatric/Behavioral: Negative.       Objective:    Physical Exam Vitals and nursing note reviewed.  Constitutional:      Appearance: Normal appearance.  HENT:     Head: Normocephalic.     Right Ear: External ear normal.     Left Ear: External ear normal.     Nose: Nose normal.     Mouth/Throat:     Mouth: Mucous membranes are moist.     Pharynx: Oropharynx is clear.  Eyes:     Extraocular Movements: Extraocular movements intact.     Conjunctiva/sclera: Conjunctivae normal.     Pupils: Pupils are equal, round, and reactive to light.  Neck:     Comments: Tracheostomy in place Cardiovascular:     Rate and Rhythm: Normal rate and regular rhythm.     Pulses: Normal pulses.     Heart sounds: Normal heart sounds.  Pulmonary:     Effort: Pulmonary effort is normal.     Breath sounds: Normal breath sounds.  Musculoskeletal:        General: Normal range of motion.  Skin:    General: Skin is warm and dry.  Neurological:     General: No focal deficit present.     Mental Status: She is alert and oriented to person, place, and time.  Psychiatric:        Mood and Affect: Mood normal.        Behavior: Behavior normal.        Thought Content: Thought content normal.        Judgment: Judgment normal.    BP 136/76 (BP Location: Left Arm, Patient Position: Sitting, Cuff Size: Normal)   Pulse (!) 56   Temp 98.8 F (37.1 C) (Oral)   Resp 18   Ht 5' 1.5" (1.562 m)   Wt 135 lb (61.2 kg)   SpO2 98%   BMI 25.09 kg/m  Wt Readings from Last 3 Encounters:  04/24/21 135 lb (61.2 kg)  02/07/21 129 lb 9.6 oz (58.8 kg)  08/08/20 129 lb (58.5 kg)     Health Maintenance Due  Topic Date Due   OPHTHALMOLOGY EXAM   Never done   Zoster Vaccines- Shingrix (1 of 2) Never done   MAMMOGRAM  Never done   DEXA SCAN  Never done   COVID-19 Vaccine (3 - Moderna risk series) 11/16/2019   INFLUENZA VACCINE  12/23/2020   HEMOGLOBIN A1C  01/13/2021  FOOT EXAM  01/14/2021   URINE MICROALBUMIN  01/14/2021    There are no preventive care reminders to display for this patient.  Lab Results  Component Value Date   TSH 2.250 02/07/2021   Lab Results  Component Value Date   WBC 5.4 07/17/2020   HGB 11.7 (L) 07/17/2020   HCT 35.0 (L) 07/17/2020   MCV 87.5 07/17/2020   PLT 348 07/17/2020   Lab Results  Component Value Date   NA 140 04/25/2021   K 4.7 04/25/2021   CO2 23 07/17/2020   GLUCOSE 88 04/25/2021   BUN 24 04/25/2021   CREATININE 1.09 (H) 04/25/2021   BILITOT 0.5 04/25/2021   ALKPHOS 92 04/25/2021   AST 20 04/25/2021   ALT 15 02/07/2021   PROT 6.9 04/25/2021   ALBUMIN 4.2 04/25/2021   CALCIUM 9.2 04/25/2021   ANIONGAP 9 07/17/2020   EGFR 54 (L) 04/25/2021   Lab Results  Component Value Date   CHOL 152 04/25/2021   Lab Results  Component Value Date   HDL 46 04/25/2021   Lab Results  Component Value Date   LDLCALC 88 04/25/2021   Lab Results  Component Value Date   TRIG 94 04/25/2021   Lab Results  Component Value Date   CHOLHDL 3.3 04/25/2021   Lab Results  Component Value Date   HGBA1C 5.9 (H) 07/16/2020      Assessment & Plan:   Problem List Items Addressed This Visit       Cardiovascular and Mediastinum   Atrial fibrillation with RVR (HCC)   Relevant Medications   atorvastatin (LIPITOR) 40 MG tablet   ELIQUIS 5 MG TABS tablet     Musculoskeletal and Integument   DJD (degenerative joint disease) of knee   Relevant Medications   diclofenac Sodium (VOLTAREN) 1 % GEL     Other   Hyperlipidemia (Chronic)   Relevant Medications   atorvastatin (LIPITOR) 40 MG tablet   ELIQUIS 5 MG TABS tablet   Other Relevant Orders   Lipid panel (Completed)   Other Visit  Diagnoses     Essential hypertension    -  Primary   Relevant Medications   atorvastatin (LIPITOR) 40 MG tablet   ELIQUIS 5 MG TABS tablet   Other Relevant Orders   Comp. Metabolic Panel (12) (Completed)   Tracheostomy status ([place])           Meds ordered this encounter  Medications   atorvastatin (LIPITOR) 40 MG tablet    Sig: Take 1 tablet (40 mg total) by mouth every evening. must have office visit for refills    Dispense:  90 tablet    Refill:  1    Order Specific Question:   Supervising Provider    Answer:   WRIGHT, PATRICK E [[id number]]   ELIQUIS 5 MG TABS tablet    Sig: TAKE 1 TABLET (5 MG TOTAL) BY MOUTH 2 (TWO) TIMES DAILY.    Dispense:  180 tablet    Refill:  1    Order Specific Question:   Supervising Provider    Answer:   Asencion Noble E [[id number]]   diclofenac Sodium (VOLTAREN) 1 % GEL    Sig: Apply 2 g topically 4 (four) times daily.    Dispense:  50 g    Refill:  4    Order Specific Question:   Supervising Provider    Answer:   WRIGHT, PATRICK E [[id number]]   1. Essential hypertension Continue follow-up with cardiology, continue current regimen, continue  checking blood pressure at home, keep a written log and have available for all office visits.  Patient to return for fasting labs.  Red flags given for prompt reevaluation - Comp. Metabolic Panel (12); Future  2. Pure hypercholesterolemia Continue current regimen - atorvastatin (LIPITOR) 40 MG tablet; Take 1 tablet (40 mg total) by mouth every evening. must have office visit for refills  Dispense: 90 tablet; Refill: 1 - Lipid panel; Future  3. Primary osteoarthritis of left knee Continue current regimen - diclofenac Sodium (VOLTAREN) 1 % GEL; Apply 2 g topically 4 (four) times daily.  Dispense: 50 g; Refill: 4  4. Atrial fibrillation with RVR (HCC) Continue current regimen, continue follow-up with cardiology - ELIQUIS 5 MG TABS tablet; TAKE 1 TABLET (5 MG TOTAL) BY MOUTH 2 (TWO) TIMES DAILY.  Dispense: 180 tablet;  Refill: 1  5. Tracheostomy status ([place])   I have reviewed the patient's medical history (PMH, PSH, Social History, Family History, Medications, and allergies) , and have been updated if relevant. I spent 20 minutes reviewing chart and  face to face time with patient.    Follow-up: Return in about 3 months (around 07/23/2021) for with PCP; needs fasting lab appt in next week.    Loraine Grip Mayers, PA-C

## 2021-04-24 NOTE — Progress Notes (Signed)
Patient has eaten and taken medication today Patient denies pain at this time.

## 2021-04-25 ENCOUNTER — Ambulatory Visit: Payer: Medicare Other | Attending: Family Medicine

## 2021-04-25 DIAGNOSIS — E7800 Pure hypercholesterolemia, unspecified: Secondary | ICD-10-CM

## 2021-04-25 DIAGNOSIS — I10 Essential (primary) hypertension: Secondary | ICD-10-CM

## 2021-04-26 LAB — COMP. METABOLIC PANEL (12)
AST: 20 IU/L (ref 0–40)
Albumin/Globulin Ratio: 1.6 (ref 1.2–2.2)
Albumin: 4.2 g/dL (ref 3.7–4.7)
Alkaline Phosphatase: 92 IU/L (ref 44–121)
BUN/Creatinine Ratio: 22 (ref 12–28)
BUN: 24 mg/dL (ref 8–27)
Bilirubin Total: 0.5 mg/dL (ref 0.0–1.2)
Calcium: 9.2 mg/dL (ref 8.7–10.3)
Chloride: 105 mmol/L (ref 96–106)
Creatinine, Ser: 1.09 mg/dL — ABNORMAL HIGH (ref 0.57–1.00)
Globulin, Total: 2.7 g/dL (ref 1.5–4.5)
Glucose: 88 mg/dL (ref 70–99)
Potassium: 4.7 mmol/L (ref 3.5–5.2)
Sodium: 140 mmol/L (ref 134–144)
Total Protein: 6.9 g/dL (ref 6.0–8.5)
eGFR: 54 mL/min/{1.73_m2} — ABNORMAL LOW (ref >59)

## 2021-04-26 LAB — LIPID PANEL
Chol/HDL Ratio: 3.3 ratio (ref 0.0–4.4)
Cholesterol, Total: 152 mg/dL (ref 100–199)
HDL: 46 mg/dL (ref >39)
LDL Chol Calc (NIH): 88 mg/dL (ref 0–99)
Triglycerides: 94 mg/dL (ref 0–149)
VLDL Cholesterol Cal: 18 mg/dL (ref 5–40)

## 2021-04-30 ENCOUNTER — Telehealth: Payer: Self-pay | Admitting: *Deleted

## 2021-04-30 NOTE — Telephone Encounter (Signed)
Medical Assistant used [employer] to contact patient.  Interpreter Name: Thedore Mins #: [id number] Patient verified DOB Patient is aware of liver being normal and needing to increase water to address slightly decreased kidney function. Patient also aware of cholesterol being well controlled on current regimen.

## 2021-04-30 NOTE — Telephone Encounter (Signed)
-----   Message from Kennieth Rad, [place] sent at 04/26/2021  7:29 AM EST ----- Please call patient and let her know that her liver function is within normal limits, her kidney function has decreased slightly, please encourage her to increase her hydration.  Her cholesterol is well controlled with current regimen.

## 2021-05-08 ENCOUNTER — Other Ambulatory Visit: Payer: Self-pay

## 2021-05-12 ENCOUNTER — Other Ambulatory Visit: Payer: Self-pay | Admitting: Acute Care

## 2021-05-12 ENCOUNTER — Other Ambulatory Visit: Payer: Self-pay

## 2021-05-13 ENCOUNTER — Ambulatory Visit (HOSPITAL_COMMUNITY)
Admission: RE | Admit: 2021-05-13 | Discharge: 2021-05-13 | Disposition: A | Discharge status: Home / Self Care | Payer: Medicare Other | Source: Ambulatory Visit | Attending: Acute Care | Admitting: Acute Care

## 2021-05-13 ENCOUNTER — Other Ambulatory Visit: Payer: Self-pay

## 2021-05-13 DIAGNOSIS — Z430 Encounter for attention to tracheostomy: Secondary | ICD-10-CM | POA: Insufficient documentation

## 2021-05-13 LAB — SARS CORONAVIRUS 2 (TAT 6-24 HRS): SARS Coronavirus 2: NEGATIVE

## 2021-05-13 NOTE — Progress Notes (Signed)
Tracheostomy Procedure Note  Jamie Mitchell [id number] 1946/01/13  Pre Procedure Tracheostomy Information  Trach Brand: Shiley Size:  4.0  [id number] Style: Uncuffed Secured by: Velcro   Procedure: Trach Cleaning and Trach change    Post Procedure Tracheostomy Information  Trach Brand: Shiley Size:  4.0 [id number] Style: Uncuffed Secured by: Velcro   Post Procedure Evaluation:  ETCO2 positive color change from yellow to purple : Yes.   Vital signs: VSS Patients current condition: stable Complications: No apparent complications Trach site exam: clean, dry Wound care done: 4 x 4 gauze Patient did tolerate procedure well.   Education: none  Prescription needs: none    Additional needs: New PMV given to patient today

## 2021-05-31 DIAGNOSIS — Z20822 Contact with and (suspected) exposure to covid-19: Secondary | ICD-10-CM | POA: Diagnosis not present

## 2021-07-25 ENCOUNTER — Other Ambulatory Visit: Payer: Self-pay

## 2021-07-29 DIAGNOSIS — Z20822 Contact with and (suspected) exposure to covid-19: Secondary | ICD-10-CM | POA: Diagnosis not present

## 2021-07-30 ENCOUNTER — Other Ambulatory Visit: Payer: Self-pay | Admitting: Acute Care

## 2021-07-31 ENCOUNTER — Other Ambulatory Visit: Payer: Self-pay | Admitting: Acute Care

## 2021-07-31 ENCOUNTER — Other Ambulatory Visit: Payer: Self-pay

## 2021-07-31 ENCOUNTER — Ambulatory Visit (HOSPITAL_COMMUNITY)
Admission: RE | Admit: 2021-07-31 | Discharge: 2021-07-31 | Disposition: A | Discharge status: Home / Self Care | Payer: Medicare Other | Source: Ambulatory Visit | Attending: Acute Care | Admitting: Acute Care

## 2021-07-31 DIAGNOSIS — Z430 Encounter for attention to tracheostomy: Secondary | ICD-10-CM | POA: Insufficient documentation

## 2021-07-31 DIAGNOSIS — Z930 Tracheostomy status: Secondary | ICD-10-CM

## 2021-07-31 DIAGNOSIS — J386 Stenosis of larynx: Secondary | ICD-10-CM | POA: Insufficient documentation

## 2021-07-31 LAB — SARS CORONAVIRUS 2 (TAT 6-24 HRS): SARS Coronavirus 2: NEGATIVE

## 2021-07-31 MED ORDER — PANTOPRAZOLE SODIUM 40 MG PO TBEC
DELAYED_RELEASE_TABLET | Freq: Two times a day (BID) | ORAL | 6 refills | Status: DC
  Filled 2021-07-31: qty 60, 30d supply, fill #0
  Filled 2021-09-01: qty 60, 30d supply, fill #1
  Filled 2021-10-06: qty 60, 30d supply, fill #2

## 2021-07-31 NOTE — Progress Notes (Signed)
Tracheostomy Procedure Note ? ?Jamie Mitchell ?[id number] ?13-Feb-1946 ? ?Pre Procedure Tracheostomy Information ? ?Trach BrandCristy Hilts ?Size:  4.0   [id number] ?Style: Uncuffed ?Secured by: Velcro ? ? ?Procedure: Trach change and trach cleaning ? ? ? ?Post Procedure Tracheostomy Information ? ?Trach BrandCristy Hilts ?Size:  4.0  [id number] ?Style: Uncuffed ?Secured by: Velcro ? ? ?Post Procedure Evaluation: ? ?ETCO2 positive color change from yellow to purple : Yes.   ?Vital signs:VSS ?Patients current condition: stable ?Complications: No apparent complications ?Trach site exam: clean, dry ?Wound care done: 4 x 4 gauze drain ?Patient did tolerate procedure well. ? ? ?Education: ?none ? ?Prescription needs: ?none ? ? ? ?Additional needs: ?New PMV given to patient today ? ? ? ? ? ? ?  ?

## 2021-08-01 ENCOUNTER — Other Ambulatory Visit: Payer: Self-pay

## 2021-08-01 NOTE — Progress Notes (Addendum)
Reason for visit  ?Planned trach change ? ?HPI  ?76 year old female patient non-English-speaking at baseline.  Follow here at the tracheostomy clinic for vocal cord injury and subglottic stenosis.  She is tracheostomy dependent and also followed by ENT.  Presents today for planned tracheostomy change ? ?Review of systems: ?General, no fever, chills, recent sick exposures.  HEENT: No headache, sinus congestion, tracheal stoma pain, does report fairly chronic ongoing nonproductive cough.  Pulmonary: No shortness of breath, wheezing, chest pain, or productivity and cough as mentioned above.  Cardiac: No chest pain, no palpitations, no lightheadedness or orthostasis.  Musculoskeletal: Denies pain, swelling, or limited mobility.  Endocrine: Denies weight changes, hot or cold intolerance.  Abdomen: Denies nausea, vomiting, diarrhea, change in appetite, change in bowel status.  GU: Deferred.  Neuro: Denies focal deficits or memory loss ? ?Physical exam ?Pulse oximetry mid 90s on room air ?General 76 year old female ambulatory, well-nourished, no distress. ?HEENT: Normocephalic atraumatic her tracheostomy site is unremarkable.  She has a PMV in place but is unable to phonate ?Pulmonary: Clear to auscultation ?Cardiac: Regular rate and rhythm ?Abdomen: Soft nontender ?Extremities: Warm dry brisk cap refill no edema ? ?Procedure: Tracheostomy change. ?The existing size 4 cuffless tracheostomy was removed, tracheostomy stoma was inspected, it was clean dry intact and without drainage or discharge.  Following this a new size 4 cuffless tracheostomy was placed over obturator and placement was verified with end-tidal CO2 patient tolerated well. ? ?Impression/plan ? 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 ? ?Discussion: ?She is tracheostomy dependent, I do not think she is decannulation candidate.  She does continue to complain of cough.  She has a history of reflux, is not currently on her  Protonix.  I wonder if this is contributing. ? ?Plan ?Continue routine tracheostomy care ?Return office visit 12 weeks for routine trach change ?Will renew her Protonix prescription ? ?My time 23 minutes ?Erick Colace ACNP-BC ?[place] ?Pager # [phone] OR # [phone] if no answer ? ?

## 2021-08-04 ENCOUNTER — Other Ambulatory Visit: Payer: Self-pay

## 2021-08-08 ENCOUNTER — Other Ambulatory Visit: Payer: Self-pay

## 2021-08-13 DIAGNOSIS — Z20822 Contact with and (suspected) exposure to covid-19: Secondary | ICD-10-CM | POA: Diagnosis not present

## 2021-09-01 ENCOUNTER — Other Ambulatory Visit: Payer: Self-pay

## 2021-09-02 ENCOUNTER — Other Ambulatory Visit: Payer: Self-pay

## 2021-09-08 DIAGNOSIS — Z20822 Contact with and (suspected) exposure to covid-19: Secondary | ICD-10-CM | POA: Diagnosis not present

## 2021-09-20 DIAGNOSIS — Z20822 Contact with and (suspected) exposure to covid-19: Secondary | ICD-10-CM | POA: Diagnosis not present

## 2021-09-23 DIAGNOSIS — Z20822 Contact with and (suspected) exposure to covid-19: Secondary | ICD-10-CM | POA: Diagnosis not present

## 2021-10-07 ENCOUNTER — Other Ambulatory Visit: Payer: Self-pay

## 2021-10-14 ENCOUNTER — Other Ambulatory Visit: Payer: Self-pay | Admitting: Family Medicine

## 2021-10-14 ENCOUNTER — Other Ambulatory Visit: Payer: Self-pay | Admitting: Physician Assistant

## 2021-10-14 DIAGNOSIS — E038 Other specified hypothyroidism: Secondary | ICD-10-CM

## 2021-10-14 DIAGNOSIS — I4891 Unspecified atrial fibrillation: Secondary | ICD-10-CM

## 2021-10-15 ENCOUNTER — Other Ambulatory Visit: Payer: Self-pay

## 2021-10-15 MED ORDER — ELIQUIS 5 MG PO TABS
5.0000 mg | ORAL_TABLET | Freq: Two times a day (BID) | ORAL | 0 refills | Status: DC
  Filled 2021-10-15: qty 60, 30d supply, fill #0

## 2021-10-15 MED ORDER — LEVOTHYROXINE SODIUM 75 MCG PO TABS
75.0000 ug | ORAL_TABLET | Freq: Every day | ORAL | 0 refills | Status: DC
  Filled 2021-10-15: qty 30, 30d supply, fill #0

## 2021-10-17 ENCOUNTER — Other Ambulatory Visit: Payer: Self-pay

## 2021-10-22 ENCOUNTER — Other Ambulatory Visit: Payer: Self-pay

## 2021-10-22 ENCOUNTER — Other Ambulatory Visit: Payer: Self-pay | Admitting: Physician Assistant

## 2021-10-22 DIAGNOSIS — E7800 Pure hypercholesterolemia, unspecified: Secondary | ICD-10-CM

## 2021-10-28 ENCOUNTER — Other Ambulatory Visit: Payer: Self-pay

## 2021-10-28 ENCOUNTER — Ambulatory Visit (HOSPITAL_COMMUNITY)
Admission: RE | Admit: 2021-10-28 | Discharge: 2021-10-28 | Disposition: A | Discharge status: Home / Self Care | Payer: Medicare Other | Source: Ambulatory Visit | Attending: Acute Care | Admitting: Acute Care

## 2021-10-28 ENCOUNTER — Other Ambulatory Visit: Payer: Self-pay | Admitting: Family Medicine

## 2021-10-28 DIAGNOSIS — X58XXXS Exposure to other specified factors, sequela: Secondary | ICD-10-CM | POA: Insufficient documentation

## 2021-10-28 DIAGNOSIS — Z430 Encounter for attention to tracheostomy: Secondary | ICD-10-CM | POA: Insufficient documentation

## 2021-10-28 DIAGNOSIS — E7800 Pure hypercholesterolemia, unspecified: Secondary | ICD-10-CM

## 2021-10-28 DIAGNOSIS — R053 Chronic cough: Secondary | ICD-10-CM | POA: Diagnosis not present

## 2021-10-28 DIAGNOSIS — Z930 Tracheostomy status: Secondary | ICD-10-CM

## 2021-10-28 DIAGNOSIS — S199XXS Unspecified injury of neck, sequela: Secondary | ICD-10-CM | POA: Diagnosis not present

## 2021-10-28 DIAGNOSIS — J386 Stenosis of larynx: Secondary | ICD-10-CM | POA: Diagnosis not present

## 2021-10-28 MED ORDER — ATORVASTATIN CALCIUM 40 MG PO TABS
40.0000 mg | ORAL_TABLET | Freq: Every evening | ORAL | 1 refills | Status: DC
  Filled 2021-10-28: qty 90, 90d supply, fill #0

## 2021-10-28 NOTE — Progress Notes (Signed)
Tracheostomy Procedure Note  Leotta Weingarten [id number] 12-Apr-1946  Pre Procedure Tracheostomy Information  Trach Brand: Shiley Size:  4.0   [id number] Style: Uncuffed Secured by: Velcro   Procedure: Trach change and trach cleaning    Post Procedure Tracheostomy Information  Trach Brand: Shiley Size:  4.0   [id number] Style: Uncuffed Secured by: Velcro   Post Procedure Evaluation:  ETCO2 positive color change from yellow to purple : Yes.   Vital signs:VSS Patients current condition: stable Complications: No apparent complications Trach site exam: clean, dry Wound care done: 4 x 4 gauze drain Patient did tolerate procedure well.   Education: none  Prescription needs: none    Additional needs: New PMV given to patient today

## 2021-10-28 NOTE — Progress Notes (Signed)
Reason for visit Plan tracheostomy change  HPI 76 year old female non-English-speaking at baseline.  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  She is tracheostomy dependent.  Presents today for planned tracheostomy change.  Only significant complaint is ongoing cough.  Review of systems General no fever chills no recent sick exposure.  HEENT no headache, sinus drainage, sore throat, postnasal drip.  Denies trach site discomfort, does have white-colored sputum.  Pulmonary: Denies increased shortness of breath or wheezing.  No chest pain.  Cardiac: No chest pain, no palpitations, no lightheadedness.  Abdomen: No nausea vomiting diarrhea.  Musculoskeletal: No pain or abnormalities.  Neuro: No headaches, gait disturbance, focal weakness or memory changes.  Physical exam Pulse oximetry 90s room air  General ambulatory 76 year old female currently in no acute distress HEENT normocephalic atraumatic size 4 tracheostomy stoma site is unremarkable she is unable to phonate, upper neck exam without upper airway wheezing or evidence of airway obstruction Pulmonary clear to auscultation diminished bases Cardiac regular rate and rhythm without murmur rub or gallop Abdomen soft without tenderness Extremities are warm and dry there is no edema Neuro awake oriented and ambulatory  Procedure The current size 4 tracheostomy was removed from stoma the site was inspected and was unremarkable.  The tracheostomy itself did have a fair amount of biofilm.  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  She does continue to complain of cough care.   Last time I renewed her PPI, she denies any reflux at this point, but still has her cough.  She does not seem particularly worried by it however it still present.  She is on amiodarone,  Her last chest x-ray was in 2022. Plan Continue routine tracheostomy care We will plan on upper airway inspection with bronchoscopy during next visit, I can examine a tracheostomy since stoma, and the tracheostomy down to the level of the carina looking specifically for evidence of tracheal injury from the tracheostomy tube which could be contributing to cough although I doubt it.  I may need to refer her to pulmonology as I do not see really any other possible contributing factors Plan on tracheostomy change in 8 weeks  21 minutes Erick Colace ACNP-BC [place] Pager # [phone] OR # [phone] if no answer

## 2021-10-28 NOTE — Telephone Encounter (Signed)
Requested Prescriptions  Pending Prescriptions Disp Refills  . atorvastatin (LIPITOR) 40 MG tablet 90 tablet 1    Sig: Take 1 tablet (40 mg total) by mouth every evening. must have office visit for refills     Cardiovascular:  Antilipid - Statins Failed - 10/28/2021  1:36 PM      Failed - Lipid Panel in normal range within the last 12 months    Cholesterol, Total  Date Value Ref Range Status  04/25/2021 152 100 - 199 mg/dL Final   LDL Chol Calc (NIH)  Date Value Ref Range Status  04/25/2021 88 0 - 99 mg/dL Final   Direct LDL  Date Value Ref Range Status  02/20/2016 78 <130 mg/dL Final    Comment:      Desirable range <100 mg/dL for patients with CHD or diabetes and <70 mg/dL for diabetic patients with known heart disease.      HDL  Date Value Ref Range Status  04/25/2021 46 >39 mg/dL Final   Triglycerides  Date Value Ref Range Status  04/25/2021 94 0 - 149 mg/dL Final         Passed - Patient is not pregnant      Passed - Valid encounter within last 12 months    Recent Outpatient Visits          6 months ago Essential hypertension   [place], Cari S, PA-C   1 year ago Type 2 diabetes mellitus with other circulatory complication, without long-term current use of insulin ([place])   [facility] [place], Charlane Ferretti, MD   1 year ago COVID-19   [facility], [place], PA-C   1 year ago Esophageal dysmotility   [facility] at [facility], Cari S, PA-C   1 year ago Type 2 diabetes mellitus with other circulatory complication, without long-term current use of insulin ([place])   [facility] Charlott Rakes, MD

## 2021-11-14 ENCOUNTER — Other Ambulatory Visit: Payer: Self-pay

## 2021-11-14 ENCOUNTER — Other Ambulatory Visit: Payer: Self-pay | Admitting: Family Medicine

## 2021-11-14 DIAGNOSIS — I4891 Unspecified atrial fibrillation: Secondary | ICD-10-CM

## 2021-11-14 DIAGNOSIS — E038 Other specified hypothyroidism: Secondary | ICD-10-CM

## 2021-11-14 MED ORDER — LEVOTHYROXINE SODIUM 75 MCG PO TABS
75.0000 ug | ORAL_TABLET | Freq: Every day | ORAL | 0 refills | Status: DC
  Filled 2021-11-14: qty 30, 30d supply, fill #0

## 2021-11-14 MED ORDER — ELIQUIS 5 MG PO TABS
5.0000 mg | ORAL_TABLET | Freq: Two times a day (BID) | ORAL | 0 refills | Status: DC
  Filled 2021-11-14: qty 60, 30d supply, fill #0

## 2021-11-18 ENCOUNTER — Ambulatory Visit: Payer: Medicare Other | Attending: Family Medicine | Admitting: Family Medicine

## 2021-11-18 ENCOUNTER — Other Ambulatory Visit: Payer: Self-pay | Admitting: Cardiology

## 2021-11-18 ENCOUNTER — Encounter: Payer: Self-pay | Admitting: Family Medicine

## 2021-11-18 ENCOUNTER — Other Ambulatory Visit: Payer: Self-pay

## 2021-11-18 VITALS — BP 128/78 | HR 55 | Temp 98.9°F | Ht 60.0 in | Wt 143.2 lb

## 2021-11-18 DIAGNOSIS — Z79899 Other long term (current) drug therapy: Secondary | ICD-10-CM | POA: Diagnosis not present

## 2021-11-18 DIAGNOSIS — Z8709 Personal history of other diseases of the respiratory system: Secondary | ICD-10-CM | POA: Insufficient documentation

## 2021-11-18 DIAGNOSIS — Z7182 Exercise counseling: Secondary | ICD-10-CM | POA: Diagnosis not present

## 2021-11-18 DIAGNOSIS — I495 Sick sinus syndrome: Secondary | ICD-10-CM | POA: Insufficient documentation

## 2021-11-18 DIAGNOSIS — Z79890 Hormone replacement therapy (postmenopausal): Secondary | ICD-10-CM | POA: Insufficient documentation

## 2021-11-18 DIAGNOSIS — I10 Essential (primary) hypertension: Secondary | ICD-10-CM | POA: Diagnosis not present

## 2021-11-18 DIAGNOSIS — Z8249 Family history of ischemic heart disease and other diseases of the circulatory system: Secondary | ICD-10-CM | POA: Diagnosis not present

## 2021-11-18 DIAGNOSIS — Z7901 Long term (current) use of anticoagulants: Secondary | ICD-10-CM | POA: Diagnosis not present

## 2021-11-18 DIAGNOSIS — E7800 Pure hypercholesterolemia, unspecified: Secondary | ICD-10-CM

## 2021-11-18 DIAGNOSIS — Z930 Tracheostomy status: Secondary | ICD-10-CM | POA: Insufficient documentation

## 2021-11-18 DIAGNOSIS — Z8673 Personal history of transient ischemic attack (TIA), and cerebral infarction without residual deficits: Secondary | ICD-10-CM | POA: Diagnosis not present

## 2021-11-18 DIAGNOSIS — E038 Other specified hypothyroidism: Secondary | ICD-10-CM | POA: Diagnosis not present

## 2021-11-18 DIAGNOSIS — I4891 Unspecified atrial fibrillation: Secondary | ICD-10-CM

## 2021-11-18 DIAGNOSIS — I480 Paroxysmal atrial fibrillation: Secondary | ICD-10-CM | POA: Diagnosis not present

## 2021-11-18 DIAGNOSIS — Z9889 Other specified postprocedural states: Secondary | ICD-10-CM | POA: Insufficient documentation

## 2021-11-18 DIAGNOSIS — I482 Chronic atrial fibrillation, unspecified: Secondary | ICD-10-CM | POA: Diagnosis not present

## 2021-11-18 DIAGNOSIS — Z6827 Body mass index (BMI) 27.0-27.9, adult: Secondary | ICD-10-CM | POA: Insufficient documentation

## 2021-11-18 DIAGNOSIS — Z760 Encounter for issue of repeat prescription: Secondary | ICD-10-CM | POA: Diagnosis not present

## 2021-11-18 DIAGNOSIS — E039 Hypothyroidism, unspecified: Secondary | ICD-10-CM | POA: Insufficient documentation

## 2021-11-18 DIAGNOSIS — Z8679 Personal history of other diseases of the circulatory system: Secondary | ICD-10-CM | POA: Diagnosis not present

## 2021-11-18 DIAGNOSIS — E1159 Type 2 diabetes mellitus with other circulatory complications: Secondary | ICD-10-CM | POA: Diagnosis not present

## 2021-11-18 DIAGNOSIS — Z713 Dietary counseling and surveillance: Secondary | ICD-10-CM | POA: Insufficient documentation

## 2021-11-18 LAB — POCT GLYCOSYLATED HEMOGLOBIN (HGB A1C): HbA1c, POC (controlled diabetic range): 5.9 % (ref 0.0–7.0)

## 2021-11-18 MED ORDER — ELIQUIS 5 MG PO TABS
5.0000 mg | ORAL_TABLET | Freq: Two times a day (BID) | ORAL | 0 refills | Status: DC
  Filled 2021-11-18 – 2021-12-09 (×2): qty 60, 30d supply, fill #0

## 2021-11-18 MED ORDER — ATORVASTATIN CALCIUM 40 MG PO TABS
40.0000 mg | ORAL_TABLET | Freq: Every evening | ORAL | 1 refills | Status: DC
  Filled 2021-11-18: qty 90, 90d supply, fill #0
  Filled 2021-12-09: qty 30, 30d supply, fill #0
  Filled 2022-02-23: qty 30, 30d supply, fill #1
  Filled 2022-03-23: qty 30, 30d supply, fill #2

## 2021-11-18 NOTE — Progress Notes (Addendum)
Subjective:  Patient ID: Jamie Mitchell, female    DOB: 11-14-1945  Age: 76 y.o. MRN: [id number]  CC: Hypertension   HPI Jamie Mitchell is a 76 y.o. 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    Interval History: Last seen in the clinic in 04/2021.  She will be traveling to [place] and is requesting a 90-day supply of her medications. She had trach change and trach cleaning at the trach clinic 3 weeks ago. Per Cardiology notes from last visit in 01/2021, she is a poor candidate for a pacemaker  She is accompanied by her daughter-in-law to today's visit and is seen with the aid of [employer] video interpreter.  Denies additional concerns at today's visit. For diabetes she is not on diet control and has no neuropathy, hypoglycemia, visual concerns. Endorses adherence with her statin, Eliquis and levothyroxine.  Denies any residual weakness. 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   DM (diabetes mellitus) (HCC)    HLD (hyperlipidemia)    Hypertension    Hypothyroidism    Renal failure 07/25/2019   Stroke [facility])     Past Surgical History:  Procedure Laterality Date   IR CT HEAD LTD  07/20/2019   IR PERCUTANEOUS ART THROMBECTOMY/INFUSION INTRACRANIAL INC DIAG ANGIO  07/20/2019   KNEE SURGERY Left    RADIOLOGY WITH ANESTHESIA N/A 07/20/2019   Procedure: IR WITH ANESTHESIA;  Surgeon: Julieanne Cotton, MD;  Location: [facility] OR;  Service: Radiology;  Laterality: N/A;   TRACHEOSTOMY TUBE PLACEMENT N/A 12/27/2019    Procedure: TRACHEOSTOMY;  Surgeon: Serena Colonel, MD;  Location: [facility] OR;  Service: ENT;  Laterality: N/A;    Family History  Problem Relation Age of Onset   Birth defects Daughter        feet   Heart disease Sister     Social History   Socioeconomic History   Marital status: Married    Spouse name: Not on file   Number of children: 11   Years of education: Not on file   Highest education level: Not on file  Occupational History   Not on file  Tobacco Use   Smoking status: Never   Smokeless tobacco: Never  Vaping Use   Vaping Use: Never used  Substance and Sexual Activity   Alcohol use: No   Drug use: No   Sexual activity: Not Currently    Birth control/protection: None    Comment: Married  Other Topics Concern   Not on file  Social History Narrative   Lives with son Elita Quick)    Social Determinants of Health   Financial Resource Strain: Not on file  Food Insecurity: Not on file  Transportation Needs: No Transportation Needs (09/26/2019)   PRAPARE - Administrator, Civil Service (Medical): No    Lack of Transportation (Non-Medical): No  Physical Activity: Not on file  Stress: Not on file  Social Connections: Not on file    No Known Allergies  Outpatient Medications Prior to Visit  Medication Sig Dispense Refill   amiodarone (PACERONE) 200 MG tablet Take 1 tablet (200 mg total) by mouth daily. 90  tablet 3   EPINEPHrine 0.3 mg/0.3 mL IJ SOAJ injection Inject 0.3 mLs (0.3 mg total) into the muscle as needed for anaphylaxis. 1 each 1   levothyroxine (SYNTHROID) 75 MCG tablet Take 1 tablet (75 mcg total) by mouth daily before breakfast. Please make PCP appointment! 30 tablet 0   atorvastatin (LIPITOR) 40 MG tablet Take 1 tablet (40 mg total) by mouth every evening. must have office visit for refills 90 tablet 1   ELIQUIS 5 MG TABS tablet Take 1 tablet (5 mg total) by mouth 2 (two) times daily. 60 tablet 0   pantoprazole (PROTONIX) 40 MG tablet TAKE 1 TABLET (40  MG TOTAL) BY MOUTH 2 (TWO) TIMES DAILY BEFORE A MEAL. 60 tablet 6   diclofenac Sodium (VOLTAREN) 1 % GEL Apply 2 g topically 4 (four) times daily. (Patient not taking: Reported on 11/18/2021) 50 g 4   ciprofloxacin (CIPRO) 500 MG tablet Take 1 tablet (500 mg total) by mouth 2 times daily for 10 days. (Patient not taking: Reported on 11/18/2021) 20 tablet 0   ciprofloxacin-dexamethasone (CIPRODEX) OTIC suspension Place 4 drops into the left ear 2 times daily for 10 days. (Patient not taking: Reported on 11/18/2021) 7.5 mL 0   Pseudoeph-Doxylamine-DM-APAP (NYQUIL PO) Take 1 Dose by mouth at bedtime as needed (cough). (Patient not taking: Reported on 11/18/2021)     No facility-administered medications prior to visit.     ROS Review of Systems  Constitutional:  Negative for activity change and appetite change.  HENT:  Negative for sinus pressure and sore throat.   Respiratory:  Negative for chest tightness, shortness of breath and wheezing.   Cardiovascular:  Negative for chest pain and palpitations.  Gastrointestinal:  Negative for abdominal distention, abdominal pain and constipation.  Genitourinary: Negative.   Musculoskeletal: Negative.   Psychiatric/Behavioral:  Negative for behavioral problems and dysphoric mood.     Objective:  BP 128/78   Pulse (!) 55   Temp 98.9 F (37.2 C) (Oral)   Ht 5' (1.524 m)   Wt 143 lb 3.2 oz (65 kg)   SpO2 96%   BMI 27.97 kg/m      11/18/2021   10:46 AM 04/24/2021    2:26 PM 02/07/2021    3:36 PM  BP/Weight  Systolic BP 128 136 138  Diastolic BP 78 76 80  Wt. (Lbs) 143.2 135 129.6  BMI 27.97 kg/m2 25.09 kg/m2 25.31 kg/m2      Physical Exam Constitutional:      Appearance: She is well-developed.  Neck:     Comments: Tracheostomy in place Cardiovascular:     Rate and Rhythm: Bradycardia present.     Heart sounds: Normal heart sounds. No murmur heard. Pulmonary:     Effort: Pulmonary effort is normal.     Breath sounds: Normal breath  sounds. No wheezing or rales.  Chest:     Chest wall: No tenderness.  Abdominal:     General: Bowel sounds are normal. There is no distension.     Palpations: Abdomen is soft. There is no mass.     Tenderness: There is no abdominal tenderness.  Musculoskeletal:        General: Normal range of motion.     Right lower leg: No edema.     Left lower leg: No edema.  Neurological:     Mental Status: She is alert and oriented to person, place, and time.  Psychiatric:        Mood and Affect: Mood normal.  Latest Ref Rng & Units 04/25/2021   10:09 AM 02/07/2021    4:12 PM 07/17/2020    3:33 AM  CMP  Glucose 70 - 99 mg/dL 88   99   BUN 8 - 27 mg/dL 24   8   Creatinine 4.09 - 1.00 mg/dL 8.11   9.14   Sodium 782 - 144 mmol/L 140   133   Potassium 3.5 - 5.2 mmol/L 4.7   4.1   Chloride 96 - 106 mmol/L 105   101   CO2 22 - 32 mmol/L   23   Calcium 8.7 - 10.3 mg/dL 9.2   8.7   Total Protein 6.0 - 8.5 g/dL 6.9  7.1    Total Bilirubin 0.0 - 1.2 mg/dL 0.5  0.4    Alkaline Phos 44 - 121 IU/L 92  99    AST 0 - 40 IU/L 20  20    ALT 0 - 32 IU/L  15      Lipid Panel     Component Value Date/Time   CHOL 152 04/25/2021 1009   TRIG 94 04/25/2021 1009   HDL 46 04/25/2021 1009   CHOLHDL 3.3 04/25/2021 1009   CHOLHDL 3.1 07/20/2019 0356   VLDL 11 07/20/2019 0356   LDLCALC 88 04/25/2021 1009   LDLDIRECT 78 02/20/2016 1221    CBC    Component Value Date/Time   WBC 5.4 07/17/2020 0333   RBC 4.00 07/17/2020 0333   HGB 11.7 (L) 07/17/2020 0333   HGB 12.7 02/14/2020 1336   HCT 35.0 (L) 07/17/2020 0333   HCT 39.6 02/14/2020 1336   PLT 348 07/17/2020 0333   PLT 324 02/14/2020 1336   MCV 87.5 07/17/2020 0333   MCV 95 02/14/2020 1336   MCH 29.3 07/17/2020 0333   MCHC 33.4 07/17/2020 0333   RDW 16.0 (H) 07/17/2020 0333   RDW 15.2 02/14/2020 1336   LYMPHSABS 2.7 02/14/2020 1336   MONOABS 0.7 12/02/2019 1021   EOSABS 0.0 02/14/2020 1336   BASOSABS 0.1 02/14/2020 1336    Lab  Results  Component Value Date   HGBA1C 5.9 11/18/2021    Lab Results  Component Value Date   TSH 2.250 02/07/2021    Assessment & Plan:  1. Type 2 diabetes mellitus with other circulatory complication, without long-term current use of insulin (HCC) Diet controlled with A1c of 5.9 Counseled on Diabetic diet, my plate method, 956 minutes of moderate intensity exercise/week Blood sugar logs with fasting goals of 80-120 mg/dl, random of less than 213 and in the event of sugars less than 60 mg/dl or greater than 086 mg/dl encouraged to notify the clinic. Advised on the need for annual eye exams, annual foot exams, Pneumonia vaccine. - POCT glycosylated hemoglobin (Hb A1C) - Microalbumin / creatinine urine ratio - CMP14+EGFR  2. Pure hypercholesterolemia Controlled Low-cholesterol diet - LP+Non-HDL Cholesterol - atorvastatin (LIPITOR) 40 MG tablet; Take 1 tablet (40 mg total) by mouth every evening. must have office visit for refills  Dispense: 90 tablet; Refill: 1  3. Atrial fibrillation with RVR (HCC) Currently in sinus rhythm Anticoagulation with Eliquis Continue amiodarone per cardiology - ELIQUIS 5 MG TABS tablet; Take 1 tablet (5 mg total) by mouth 2 (two) times daily.  Dispense: 60 tablet; Refill: 0  4. Other specified hypothyroidism Controlled We will check thyroid labs and adjust levothyroxine dose accordingly - T3, free - T4, free - TSH   Health Care Maintenance: We will address when she is back in the country  Meds ordered this encounter  Medications   atorvastatin (LIPITOR) 40 MG tablet    Sig: Take 1 tablet (40 mg total) by mouth every evening. must have office visit for refills    Dispense:  90 tablet    Refill:  1    #90 supply[place]   ELIQUIS 5 MG TABS tablet    Sig: Take 1 tablet (5 mg total) by mouth 2 (two) times daily.    Dispense:  60 tablet    Refill:  0    #90 supply[place]    Follow-up:  Return in about 6 months (around 05/20/2022).       Hoy Register, MD, FAAFP. [facility] [place], [place] [phone]   11/18/2021, 11:31 AM

## 2021-11-19 ENCOUNTER — Other Ambulatory Visit: Payer: Self-pay

## 2021-11-19 LAB — CMP14+EGFR
ALT: 11 IU/L (ref 0–32)
AST: 17 IU/L (ref 0–40)
Albumin/Globulin Ratio: 1.5 (ref 1.2–2.2)
Albumin: 4.2 g/dL (ref 3.7–4.7)
Alkaline Phosphatase: 111 IU/L (ref 44–121)
BUN/Creatinine Ratio: 16 (ref 12–28)
BUN: 18 mg/dL (ref 8–27)
Bilirubin Total: 0.5 mg/dL (ref 0.0–1.2)
CO2: 21 mmol/L (ref 20–29)
Calcium: 8.9 mg/dL (ref 8.7–10.3)
Chloride: 103 mmol/L (ref 96–106)
Creatinine, Ser: 1.1 mg/dL — ABNORMAL HIGH (ref 0.57–1.00)
Globulin, Total: 2.8 g/dL (ref 1.5–4.5)
Glucose: 87 mg/dL (ref 70–99)
Potassium: 5 mmol/L (ref 3.5–5.2)
Sodium: 141 mmol/L (ref 134–144)
Total Protein: 7 g/dL (ref 6.0–8.5)
eGFR: 53 mL/min/{1.73_m2} — ABNORMAL LOW (ref >59)

## 2021-11-19 LAB — LP+NON-HDL CHOLESTEROL
Cholesterol, Total: 136 mg/dL (ref 100–199)
HDL: 44 mg/dL (ref >39)
LDL Chol Calc (NIH): 71 mg/dL (ref 0–99)
Total Non-HDL-Chol (LDL+VLDL): 92 mg/dL (ref 0–129)
Triglycerides: 119 mg/dL (ref 0–149)
VLDL Cholesterol Cal: 21 mg/dL (ref 5–40)

## 2021-11-19 LAB — T4, FREE: Free T4: 1.55 ng/dL (ref 0.82–1.77)

## 2021-11-19 LAB — TSH: TSH: 1.32 u[IU]/mL (ref 0.450–4.500)

## 2021-11-19 LAB — T3, FREE: T3, Free: 1.6 pg/mL — ABNORMAL LOW (ref 2.0–4.4)

## 2021-11-19 MED ORDER — AMIODARONE HCL 200 MG PO TABS
200.0000 mg | ORAL_TABLET | Freq: Every day | ORAL | 3 refills | Status: AC
Start: 2021-11-19 — End: ?
  Filled 2021-11-19: qty 90, 90d supply, fill #0

## 2021-11-20 ENCOUNTER — Other Ambulatory Visit: Payer: Self-pay

## 2021-11-20 ENCOUNTER — Other Ambulatory Visit: Payer: Self-pay | Admitting: Family Medicine

## 2021-11-20 DIAGNOSIS — E038 Other specified hypothyroidism: Secondary | ICD-10-CM

## 2021-11-20 MED ORDER — LEVOTHYROXINE SODIUM 75 MCG PO TABS
75.0000 ug | ORAL_TABLET | Freq: Every day | ORAL | 1 refills | Status: DC
  Filled 2021-11-20 – 2021-12-09 (×2): qty 90, 90d supply, fill #0
  Filled 2022-03-08: qty 90, 90d supply, fill #1

## 2021-12-09 ENCOUNTER — Other Ambulatory Visit: Payer: Self-pay

## 2021-12-19 ENCOUNTER — Ambulatory Visit (HOSPITAL_COMMUNITY)
Admission: RE | Admit: 2021-12-19 | Discharge: 2021-12-19 | Disposition: A | Discharge status: Home / Self Care | Payer: Medicare Other | Source: Ambulatory Visit | Attending: Acute Care | Admitting: Acute Care

## 2021-12-19 DIAGNOSIS — J9801 Acute bronchospasm: Secondary | ICD-10-CM | POA: Diagnosis not present

## 2021-12-19 DIAGNOSIS — Z430 Encounter for attention to tracheostomy: Secondary | ICD-10-CM | POA: Insufficient documentation

## 2021-12-19 DIAGNOSIS — R059 Cough, unspecified: Secondary | ICD-10-CM | POA: Insufficient documentation

## 2021-12-19 DIAGNOSIS — Z930 Tracheostomy status: Secondary | ICD-10-CM | POA: Diagnosis not present

## 2021-12-19 NOTE — Progress Notes (Signed)
Reason for visit Plan tracheostomy change  HPI 76 year old female non-English-speaking at baseline.  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  She is tracheostomy dependent.  Presents today for planned tracheostomy change.  Only significant complaint is ongoing cough. Returns today for planned f/u. On our last visit I had also planned to evaluate the trach for evidence to stoma injury w/ bronch.   Review of systems Review of Systems  Constitutional:  Positive for fever. Negative for chills and malaise/fatigue.  HENT:  Negative for congestion.   Eyes: Negative.   Respiratory:  Positive for cough.        Notes no reflux, had no improvement w/ PPI trial Cough occurs typically under following circumstances: -cold exposure -anxiety  -increased activity   Cardiovascular: Negative.   Gastrointestinal:  Negative for heartburn and vomiting.  Skin: Negative.      Physical exam Pulse oximetry:mid 90s   General 76 year old female. She is ambulatory and in no distress HENT NCAT no JVD  Pulm clear  Card rrr Abd soft  Ext warm and dry  Neuro intact  Procedure Bronch at bedside: evaluate for lesions in upper trachea The bronch was entered thru the trach. The area distal to the trach was evaluated and clear of trauma or lesions.Then leaving the bronch in place just distal to the end of trach tube we slowly retracted the trach tube inspecting the trachea all the way up to level of stoma. No lesions or injury noted.  At that point a new trach was placed over obturator. Placement confirmed via ETCO2. 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 Bronchospasm)  Discussion 76 year old female doing well from trach standpoint. She still continues to have cough that to me sounds like could be more intermittent bronchospasm. The cough is not r/t reflux (tried PPI  tria). Not related to trach (looked and saw no evidence of trauma by FOB). As the cough seems to happen when she is anxious, exerting self or when cold I am concerned that this may be r/t reactive airway disease.   Plan Cont routine trach care We will see her in 3 mo. (She is going to [place] this month) When she gets back I will refer her to Pulmonary to further eval her cough    25 minutes   Erick Colace ACNP-BC [place] Pager # [phone] OR # [phone] if no answer

## 2021-12-19 NOTE — Progress Notes (Signed)
Tracheostomy Procedure Note  Jocelin Schuelke [id number] 20-Jan-1946  Pre Procedure Tracheostomy Information  Trach Brand: Shiley Size:  4.0 Style: Uncuffed Secured by: Velcro   Procedure: Tach Cleaning and Trach Cleanin Bronch done this visit for visualization of trach only   Post Procedure Tracheostomy Information  Trach Brand: Shiley Size:  4.0 Style: Uncuffed Secured by: Velcro   Post Procedure Evaluation:  ETCO2 positive color change from yellow to purple : Yes.   Vital signs:VSS sats 97% Patients current condition: stable Complications: No apparent complications Trach site exam: dry and dry Wound care done: 4 x 4 gauze drain Patient did tolerate procedure well.   Education: none  Prescription needs: none    Additional needs: none

## 2022-01-13 ENCOUNTER — Other Ambulatory Visit: Payer: Self-pay | Admitting: Family Medicine

## 2022-01-13 DIAGNOSIS — I4891 Unspecified atrial fibrillation: Secondary | ICD-10-CM

## 2022-01-14 ENCOUNTER — Other Ambulatory Visit: Payer: Self-pay

## 2022-01-14 MED ORDER — ELIQUIS 5 MG PO TABS
5.0000 mg | ORAL_TABLET | Freq: Two times a day (BID) | ORAL | 0 refills | Status: DC
  Filled 2022-01-14: qty 180, 90d supply, fill #0

## 2022-01-15 ENCOUNTER — Other Ambulatory Visit: Payer: Self-pay

## 2022-02-24 ENCOUNTER — Other Ambulatory Visit: Payer: Self-pay

## 2022-02-24 ENCOUNTER — Ambulatory Visit: Payer: Medicare Other | Admitting: Family Medicine

## 2022-03-09 ENCOUNTER — Other Ambulatory Visit: Payer: Self-pay

## 2022-03-24 ENCOUNTER — Other Ambulatory Visit: Payer: Self-pay

## 2022-03-26 ENCOUNTER — Telehealth: Payer: Self-pay | Admitting: Family Medicine

## 2022-03-26 NOTE — Telephone Encounter (Signed)
Rosemarie Ax, the patient's daughter-in-law, stated she was advised that a renewal is needed for supplies to clean the patient's tracheal tube. Pt stated that the company that sends the supplies advised them that Medicaid is requesting this and only sent a week supply and will not be sending any more supplies until this is renewed.   Please advise.

## 2022-03-27 NOTE — Telephone Encounter (Signed)
Call placed to patient and she states that she needs a script for cleaning supplies for her Tracheal tube.  Fax Number [phone]

## 2022-03-30 MED ORDER — MISC. DEVICES MISC: 11 refills | Status: AC

## 2022-03-30 NOTE — Telephone Encounter (Signed)
Prescription has been written.

## 2022-04-01 ENCOUNTER — Ambulatory Visit: Payer: Medicare Other | Attending: Family Medicine | Admitting: Family Medicine

## 2022-04-01 ENCOUNTER — Encounter: Payer: Self-pay | Admitting: Family Medicine

## 2022-04-01 ENCOUNTER — Other Ambulatory Visit: Payer: Self-pay

## 2022-04-01 VITALS — BP 137/85 | HR 51 | Wt 139.8 lb

## 2022-04-01 DIAGNOSIS — Z23 Encounter for immunization: Secondary | ICD-10-CM

## 2022-04-01 DIAGNOSIS — I4891 Unspecified atrial fibrillation: Secondary | ICD-10-CM | POA: Diagnosis not present

## 2022-04-01 DIAGNOSIS — Z760 Encounter for issue of repeat prescription: Secondary | ICD-10-CM | POA: Insufficient documentation

## 2022-04-01 DIAGNOSIS — Z79890 Hormone replacement therapy (postmenopausal): Secondary | ICD-10-CM | POA: Diagnosis not present

## 2022-04-01 DIAGNOSIS — E1159 Type 2 diabetes mellitus with other circulatory complications: Secondary | ICD-10-CM | POA: Diagnosis not present

## 2022-04-01 DIAGNOSIS — Z8673 Personal history of transient ischemic attack (TIA), and cerebral infarction without residual deficits: Secondary | ICD-10-CM | POA: Diagnosis not present

## 2022-04-01 DIAGNOSIS — R4701 Aphasia: Secondary | ICD-10-CM | POA: Diagnosis not present

## 2022-04-01 DIAGNOSIS — Z7901 Long term (current) use of anticoagulants: Secondary | ICD-10-CM | POA: Insufficient documentation

## 2022-04-01 DIAGNOSIS — Z930 Tracheostomy status: Secondary | ICD-10-CM

## 2022-04-01 DIAGNOSIS — Z79899 Other long term (current) drug therapy: Secondary | ICD-10-CM | POA: Insufficient documentation

## 2022-04-01 DIAGNOSIS — E7800 Pure hypercholesterolemia, unspecified: Secondary | ICD-10-CM | POA: Diagnosis not present

## 2022-04-01 DIAGNOSIS — E038 Other specified hypothyroidism: Secondary | ICD-10-CM

## 2022-04-01 DIAGNOSIS — I480 Paroxysmal atrial fibrillation: Secondary | ICD-10-CM | POA: Diagnosis not present

## 2022-04-01 LAB — POCT GLYCOSYLATED HEMOGLOBIN (HGB A1C): HbA1c, POC (controlled diabetic range): 5.8 % (ref 0.0–7.0)

## 2022-04-01 LAB — GLUCOSE, POCT (MANUAL RESULT ENTRY): POC Glucose: 99 mg/dl (ref 70–99)

## 2022-04-01 MED ORDER — ELIQUIS 5 MG PO TABS
5.0000 mg | ORAL_TABLET | Freq: Two times a day (BID) | ORAL | 1 refills | Status: AC
  Filled 2022-04-01 – 2022-04-10 (×2): qty 180, 90d supply, fill #0
  Filled 2022-07-14: qty 180, 90d supply, fill #1

## 2022-04-01 MED ORDER — ATORVASTATIN CALCIUM 40 MG PO TABS
40.0000 mg | ORAL_TABLET | Freq: Every evening | ORAL | 1 refills | Status: AC
  Filled 2022-04-01 – 2022-04-15 (×4): qty 90, 90d supply, fill #0
  Filled 2022-07-14: qty 90, 90d supply, fill #1

## 2022-04-01 NOTE — Telephone Encounter (Signed)
Script has been faxed over

## 2022-04-01 NOTE — Patient Instructions (Signed)
Traqueostoma, cuidados posteriores Tracheostomy, Care After Esta hoja le brinda informacin sobre cmo cuidarse despus del procedimiento. El mdico tambin podr darle instrucciones ms especficas. Comunquese con el mdico si tiene problemas o preguntas. Qu puedo esperar despus del procedimiento? Despus del procedimiento, es normal [employer] siguientes sntomas: [place], supuracin o sangre seca alrededor de la abertura de la traqueostoma (estoma). Imposibilidad de hablar con la voz normal. Ms tos con mucosidad. Sentidos limitados del olfato y Dispensing optician. Siga estas instrucciones en su casa: Cuidados de la traqueostoma [employer] manos con agua y jabn durante 20 segundos antes y despus de tocar el vendaje u otra parte de la traqueostoma. Cambie la venda (vendaje) como se lo haya indicado el mdico. Asegrese de que usted y las personas que lo asisten sepan cmo hacer lo siguiente: Limpiar y Designer, jewellery cnula de traqueotoma. Mantener el estoma libre de mucosidad. Succionar el conducto que le permite respirar (trquea). Cuidar la piel de alrededor del estoma. Proteger la trquea y los pulmones del aire seco. Para hacer esto: Utilice un intercambiador de calor y humedad (HME) con la cnula de traqueotoma. Tambin puede [employer] cualquier otro dispositivo que agregue humedad al aire. Use un humidificador para agregar humedad al aire de su casa. Beba abundante agua para mantener una hidratacin [place]. Al cambiar el soporte de la traqueostoma alrededor del cuello, asegrese de poder introducir dos dedos entre el soporte y el cuello. Siempre tenga los insumos disponibles para el cuidado de la traqueostoma. Comida y bebida  Siga las indicaciones del mdico respecto de las restricciones en las comidas o las bebidas. Pruebe agregar condimentos y hierbas a los alimentos para Engineer, maintenance (IT). Beba suficiente agua o lquidos para mantener la orina de color amarillo plido y  para Theatre manager la mucosidad poco espesa. Actividad Si le administraron un sedante durante el procedimiento, puede afectarlo por varias horas. No conduzca ni opere maquinaria hasta que el mdico le indique que es seguro [place]. Evite actividades que requieran Hydrographic surveyor durante 2 semanas o el tiempo que le haya indicado el mdico. Retome sus actividades normales segn lo indicado por el mdico. Pregntele al mdico qu actividades son seguras para usted. Instrucciones generales Evite que la cnula de traqueostoma se moje. Cuando tome un bao de inmersin o [place] la cnula de traqueotoma con un escudo de ducha, como se lo haya indicado el mdico. No practique natacin. Vaya a un terapeuta del habla para hallar la mejor forma de comunicarse. Existen muchas opciones de comunicacin con una cnula de traqueotoma. No consuma ningn producto que contenga nicotina o tabaco. Estos productos incluyen cigarrillos, tabaco para Higher education careers adviser y aparatos de vapeo, como los Psychologist, sport and exercise. Si necesita ayuda para dejar de fumar, consulte al [employer]. Use los medicamentos de venta libre y los recetados solamente como se lo haya indicado el mdico. Use una pulsera o un collar de alerta mdica que diga que tiene una cnula de traqueotoma. Cumpla con todas las visitas de seguimiento. Esto es importante. Comunquese con un mdico si tiene: Fiebre o escalofros. Una tos que no desaparece y produce ms mucosidad. Ms sangrado alrededor de la cnula de traqueotoma y la abertura de la traqueostoma. Enrojecimiento, hinchazn, drenaje o llagas alrededor de la cnula de traqueotoma o del estoma. Preguntas acerca de cmo cuidar de la traqueostoma. Solicita ayuda de inmediato si: Tiene dificultad para respirar. Su tubo traqueal se sale y no Adult nurse a Radiation protection practitioner. Tose o se ahoga despus de comer. Tiene sangre de color rojo brillante  que proviene de la cnula de traqueotoma. Estos sntomas pueden  representar un problema grave que constituye Engineer, maintenance (IT). No espere a ver si los sntomas desaparecen. Solicite atencin mdica de inmediato. Comunquese con el servicio de emergencias de su localidad (911 en los [place]). No conduzca por sus propios medios [employer]. Resumen Despus de una traqueotoma, es comn tener sangre seca y un sangrado leve alrededor de la abertura de la traqueostoma. Lvese las manos con agua y jabn durante al menos 20 segundos antes de tocar el vendaje u otra parte de la traqueostoma. Asegrese de que usted y las personas que lo asisten sepan cmo limpiar y Child psychotherapist tubo para respirar (cnula de traqueostoma). Busque ayuda de inmediato si tiene problemas para respirar. Esta informacin no tiene Marine scientist el consejo del mdico. Asegrese de hacerle al mdico cualquier pregunta que tenga. Document Revised: 12/25/2019 Document Reviewed: 12/25/2019 Elsevier Patient Education  [place].

## 2022-04-01 NOTE — Progress Notes (Signed)
Subjective:  Patient ID: Jamie Mitchell, female    DOB: April 13, 1946  Age: 76 y.o. MRN: [id number]  CC: Medication Refill (Patient going out the country for 3 months ) and Diabetes   HPI Jamie Mitchell is a 76 y.o. 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    Seen with the aid of a [employer] video interpreter.  Interval History:   She will be traveling out of the country and is needing a refill of her medications for 3 months. Her diabetes is diet controlled and A1c is 5.8.  She has no neuropathy or hypoglycemic symptoms.  Not up-to-date on annual eye exams. Endorses adherence with her statin and Eliquis and denies presence of bleeding.  She is also doing well on levothyroxine. Amiodarone is prescribed by cardiology and she denies presence of dyspnea, palpitations, chest pains or dyspnea.  Daughter is requesting a prescription for tracheostomy tube for cleaning supplies.  I informed her that this was already written 2 days ago and faxed to [facility].  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   DM (diabetes mellitus) ([place])    HLD (hyperlipidemia)    Hypertension    Hypothyroidism    Renal failure 07/25/2019   Stroke [facility])     Past Surgical History:  Procedure Laterality Date   IR CT HEAD LTD  07/20/2019   IR PERCUTANEOUS ART THROMBECTOMY/INFUSION INTRACRANIAL INC DIAG ANGIO  07/20/2019   KNEE SURGERY Left    RADIOLOGY WITH ANESTHESIA N/A 07/20/2019   Procedure: IR WITH ANESTHESIA;  Surgeon: Luanne Bras, MD;   Location: [facility];  Service: Radiology;  Laterality: N/A;   TRACHEOSTOMY TUBE PLACEMENT N/A 12/27/2019   Procedure: TRACHEOSTOMY;  Surgeon: Izora Gala, MD;  Location: [facility] OR;  Service: ENT;  Laterality: N/A;    Family History  Problem Relation Age of Onset   Birth defects Daughter        feet   Heart disease Sister     Social History   Socioeconomic History   Marital status: Married    Spouse name: Not on file   Number of children: 74   Years of education: Not on file   Highest education level: Not on file  Occupational History   Not on file  Tobacco Use   Smoking status: Never   Smokeless tobacco: Never  Vaping Use   Vaping Use: Never used  Substance and Sexual Activity   Alcohol use: No   Drug use: No   Sexual activity: Not Currently    Birth control/protection: None    Comment: Married  Other Topics Concern   Not on file  Social History Narrative   Lives with son [name])    Social Determinants of Health   Financial Resource Strain: Not on file  Food Insecurity: Not on file  Transportation Needs: No Transportation Needs (09/26/2019)   PRAPARE - Hydrologist (Medical): No    Lack of Transportation (Non-Medical): No  Physical Activity: Not on file  Stress: Not on file  Social Connections: Not on file    No Known Allergies  Outpatient Medications Prior to  Visit  Medication Sig Dispense Refill   amiodarone (PACERONE) 200 MG tablet Take 1 tablet (200 mg total) by mouth daily. 90 tablet 3   diclofenac Sodium (VOLTAREN) 1 % GEL Apply 2 g topically 4 (four) times daily. 50 g 4   EPINEPHrine 0.3 mg/0.3 mL IJ SOAJ injection Inject 0.3 mLs (0.3 mg total) into the muscle as needed for anaphylaxis. 1 each 1   levothyroxine (SYNTHROID) 75 MCG tablet Take 1 tablet (75 mcg total) by mouth daily before breakfast. Please make PCP appointment! 90 tablet 1   atorvastatin (LIPITOR) 40 MG tablet Take 1 tablet (40 mg total) by mouth every evening. must have  office visit for refills 90 tablet 1   ELIQUIS 5 MG TABS tablet Take 1 tablet (5 mg total) by mouth 2 (two) times daily. 180 tablet 0   Misc. Devices MISC Tracheostomy tube cleaning supplies. 1 each 11   No facility-administered medications prior to visit.     ROS Review of Systems  Constitutional:  Negative for activity change and appetite change.  HENT:  Negative for sinus pressure and sore throat.   Respiratory:  Negative for chest tightness, shortness of breath and wheezing.   Cardiovascular:  Negative for chest pain and palpitations.  Gastrointestinal:  Negative for abdominal distention, abdominal pain and constipation.  Genitourinary: Negative.   Musculoskeletal: Negative.   Psychiatric/Behavioral:  Negative for behavioral problems and dysphoric mood.     Objective:  BP 137/85   Pulse (!) 51   Wt 139 lb 12.8 oz (63.4 kg)   SpO2 98%   BMI 27.30 kg/m      04/01/2022   11:08 AM 11/18/2021   10:46 AM 04/24/2021    2:26 PM  BP/Weight  Systolic BP 742 595 638  Diastolic BP 85 78 76  Wt. (Lbs) 139.8 143.2 135  BMI 27.3 kg/m2 27.97 kg/m2 25.09 kg/m2      Physical Exam Constitutional:      Appearance: She is well-developed.  Neck:     Comments: Tracheostomy in place Cardiovascular:     Rate and Rhythm: Normal rate.     Heart sounds: Normal heart sounds. No murmur heard. Pulmonary:     Effort: Pulmonary effort is normal.     Breath sounds: Normal breath sounds. No wheezing or rales.  Chest:     Chest wall: No tenderness.  Abdominal:     General: Bowel sounds are normal. There is no distension.     Palpations: Abdomen is soft. There is no mass.     Tenderness: There is no abdominal tenderness.  Musculoskeletal:        General: Normal range of motion.     Right lower leg: No edema.     Left lower leg: No edema.  Neurological:     Mental Status: She is alert and oriented to person, place, and time.  Psychiatric:        Mood and Affect: Mood normal.         Latest Ref Rng & Units 11/18/2021   11:38 AM 04/25/2021   10:09 AM 02/07/2021    4:12 PM  CMP  Glucose 70 - 99 mg/dL 87  88    BUN 8 - 27 mg/dL 18  24    Creatinine 0.57 - 1.00 mg/dL 1.10  1.09    Sodium 134 - 144 mmol/L 141  140    Potassium 3.5 - 5.2 mmol/L 5.0  4.7    Chloride 96 - 106 mmol/L 103  105    CO2 20 - 29 mmol/L 21     Calcium 8.7 - 10.3 mg/dL 8.9  9.2    Total Protein 6.0 - 8.5 g/dL 7.0  6.9  7.1   Total Bilirubin 0.0 - 1.2 mg/dL 0.5  0.5  0.4   Alkaline Phos 44 - 121 IU/L 111  92  99   AST 0 - 40 IU/L '17  20  20   '$ ALT 0 - 32 IU/L 11   15     Lipid Panel     Component Value Date/Time   CHOL 136 11/18/2021 1138   TRIG 119 11/18/2021 1138   HDL 44 11/18/2021 1138   CHOLHDL 3.3 04/25/2021 1009   CHOLHDL 3.1 07/20/2019 0356   VLDL 11 07/20/2019 0356   LDLCALC 71 11/18/2021 1138   LDLDIRECT 78 02/20/2016 1221    CBC    Component Value Date/Time   WBC 5.4 07/17/2020 0333   RBC 4.00 07/17/2020 0333   HGB 11.7 (L) 07/17/2020 0333   HGB 12.7 02/14/2020 1336   HCT 35.0 (L) 07/17/2020 0333   HCT 39.6 02/14/2020 1336   PLT 348 07/17/2020 0333   PLT 324 02/14/2020 1336   MCV 87.5 07/17/2020 0333   MCV 95 02/14/2020 1336   MCH 29.3 07/17/2020 0333   MCHC 33.4 07/17/2020 0333   RDW 16.0 (H) 07/17/2020 0333   RDW 15.2 02/14/2020 1336   LYMPHSABS 2.7 02/14/2020 1336   MONOABS 0.7 12/02/2019 1021   EOSABS 0.0 02/14/2020 1336   BASOSABS 0.1 02/14/2020 1336    Lab Results  Component Value Date   HGBA1C 5.8 04/01/2022    Lab Results  Component Value Date   TSH 1.320 11/18/2021    Assessment & Plan:  1. Type 2 diabetes mellitus with other circulatory complication, without long-term current use of insulin (HCC) Diet controlled with A1c of 5.8 She is due for ophthalmology exam and referral will be placed when she returns from [place] Counseled on Diabetic diet, my plate method, 182 minutes of moderate intensity exercise/week Blood sugar logs with fasting  goals of 80-120 mg/dl, random of less than 180 and in the event of sugars less than 60 mg/dl or greater than 400 mg/dl encouraged to notify the clinic. Advised on the need for annual eye exams, annual foot exams, Pneumonia vaccine. - POCT glucose (manual entry) - POCT glycosylated hemoglobin (Hb X9B) - Basic Metabolic Panel  2. Pure hypercholesterolemia Controlled Continue statin Low cholesterol diet - atorvastatin (LIPITOR) 40 MG tablet; Take 1 tablet (40 mg total) by mouth every evening. must have office visit for refills  Dispense: 90 tablet; Refill: 1  3. Atrial fibrillation with RVR (HCC) Currently in sinus rhythm Amiodarone is prescribed by Cardiology - ELIQUIS 5 MG TABS tablet; Take 1 tablet (5 mg total) by mouth 2 (two) times daily.  Dispense: 180 tablet; Refill: 1  4. Other specified hypothyroidism Controlled We will send of thyroid labs and adjust regimen accordingly  5. Tracheostomy status [facility]) Prescription for cleaning supplies had been faxed over to her tracheostomy company  6. Need for immunization against influenza - Flu Vaccine QUAD High Dose(Fluad)   Health Care Maintenance: She is traveling out of the country.  Will address other healthcare maintenance when she is back Meds ordered this encounter  Medications   atorvastatin (LIPITOR) 40 MG tablet    Sig: Take 1 tablet (40 mg total) by mouth every evening. must have office visit for refills    Dispense:  90 tablet    Refill:  1    #90 supply, [place] the [place]   ELIQUIS 5 MG TABS tablet    Sig: Take 1 tablet (5 mg total) by mouth 2 (two) times daily.    Dispense:  180 tablet    Refill:  1    #90 supply, travelling out out of the Country    Follow-up: No follow-ups on file.       Charlott Rakes, MD, FAAFP. [facility] and [place], [place]   04/01/2022, 11:31 AM

## 2022-04-02 LAB — BASIC METABOLIC PANEL
BUN/Creatinine Ratio: 18 (ref 12–28)
BUN: 21 mg/dL (ref 8–27)
CO2: 21 mmol/L (ref 20–29)
Calcium: 9.1 mg/dL (ref 8.7–10.3)
Chloride: 104 mmol/L (ref 96–106)
Creatinine, Ser: 1.2 mg/dL — ABNORMAL HIGH (ref 0.57–1.00)
Glucose: 91 mg/dL (ref 70–99)
Potassium: 4.4 mmol/L (ref 3.5–5.2)
Sodium: 140 mmol/L (ref 134–144)
eGFR: 48 mL/min/{1.73_m2} — ABNORMAL LOW (ref >59)

## 2022-04-04 LAB — SPECIMEN STATUS REPORT

## 2022-04-04 LAB — T4, FREE: Free T4: 1.87 ng/dL — ABNORMAL HIGH (ref 0.82–1.77)

## 2022-04-04 LAB — T3: T3, Total: 58 ng/dL — ABNORMAL LOW (ref 71–180)

## 2022-04-04 LAB — TSH: TSH: 1.06 u[IU]/mL (ref 0.450–4.500)

## 2022-04-06 ENCOUNTER — Other Ambulatory Visit: Payer: Self-pay

## 2022-04-06 ENCOUNTER — Ambulatory Visit (HOSPITAL_COMMUNITY)
Admission: RE | Admit: 2022-04-06 | Discharge: 2022-04-06 | Disposition: A | Discharge status: Home / Self Care | Payer: Medicare Other | Source: Ambulatory Visit | Attending: Acute Care | Admitting: Acute Care

## 2022-04-06 ENCOUNTER — Other Ambulatory Visit: Payer: Self-pay | Admitting: Family Medicine

## 2022-04-06 DIAGNOSIS — Z930 Tracheostomy status: Secondary | ICD-10-CM | POA: Diagnosis not present

## 2022-04-06 DIAGNOSIS — R491 Aphonia: Secondary | ICD-10-CM | POA: Insufficient documentation

## 2022-04-06 DIAGNOSIS — J386 Stenosis of larynx: Secondary | ICD-10-CM

## 2022-04-06 DIAGNOSIS — E038 Other specified hypothyroidism: Secondary | ICD-10-CM

## 2022-04-06 DIAGNOSIS — K224 Dyskinesia of esophagus: Secondary | ICD-10-CM | POA: Diagnosis present

## 2022-04-06 DIAGNOSIS — S1983XS Other specified injuries of vocal cord, sequela: Secondary | ICD-10-CM | POA: Diagnosis present

## 2022-04-06 MED ORDER — LEVOTHYROXINE SODIUM 75 MCG PO TABS
75.0000 ug | ORAL_TABLET | Freq: Every day | ORAL | 1 refills | Status: AC
  Filled 2022-04-06 – 2022-04-10 (×2): qty 90, 90d supply, fill #0
  Filled 2022-04-13: qty 30, 30d supply, fill #0
  Filled 2022-07-14: qty 30, 30d supply, fill #1
  Filled 2022-08-12: qty 30, 30d supply, fill #2
  Filled 2022-08-12: qty 90, 90d supply, fill #2

## 2022-04-06 NOTE — Progress Notes (Signed)
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 I say her last on 7/28. She has since returned from a trip to [place]. She has no new complaints. In fact the cough that she complained of before has since resolved. She presents today for planned trach change    Review of Systems  Constitutional:  Negative for chills and fever.  HENT:  Negative for congestion, sinus pain and sore throat.   Eyes: Negative.   Respiratory: Negative.  Negative for stridor.   Cardiovascular: Negative.   Gastrointestinal: Negative.   Genitourinary: Negative.   Musculoskeletal: Negative.   Skin: Negative.   Neurological: Negative.   Endo/Heme/Allergies: Negative.     Exam General pleasant 76 year old female. Ambulatory into trach clinic. No distress  HENT NCAT no JVD trach stoma is clean. Aphonic  Pulm clear  Card rrr Abd soft Ext no edema Neuro at baseline. Speaks w/ spanish interpreter  Procedure The trach was removed. Site inspected and unremarkable. She did have a sig amount of biofilm on the trach but otherwise nothing notable. The new trach was inserted over obturator, pt tolerated well. Placement confirmed via ETCO2   Principal Problem:   Tracheostomy status ([place]) Active Problems:   Esophageal dysmotility   Subglottic stenosis   Other specified injuries of vocal cord, sequela   Aphonia  Tracheostomy status ([place]) Assessment & Plan: Trach Brand: Shiley Size:  4.0 [id number] Style: Uncuffed Secured by: Velcro Last change 11/13   Discussion  Stable trach  Not candidate for decannulation  Cough noted on last visit resolved.   Plan ROV 12 weeks for planned trach change Cont routine trach care    Other specified injuries of vocal cord, sequela  Aphonia    My time  23 min  Erick Colace ACNP-BC [place] Pager # [phone] OR # [phone] if no answer

## 2022-04-06 NOTE — Assessment & Plan Note (Signed)
Trach BrandCristy Hilts Size:  4.0 [id number] Style: Uncuffed Secured by: Velcro Last change 11/13   Discussion  Stable trach  Not candidate for decannulation  Cough noted on last visit resolved.   Plan ROV 12 weeks for planned trach change Cont routine trach care

## 2022-04-06 NOTE — Progress Notes (Signed)
Tracheostomy Procedure Note  Jamie Mitchell [id number] 04/29/1946  Pre Procedure Tracheostomy Information  Trach Brand: Shiley Size:  4.0 [id number] Style: Uncuffed Secured by: Velcro   Procedure: Trach Change and Trach Cleaning    Post Procedure Tracheostomy Information  Trach Brand: Shiley Size:  4.0  [id number] Style: Uncuffed Secured by: Velcro   Post Procedure Evaluation:  ETCO2 positive color change from yellow to purple : Yes.   Vital signs:VSS Patients current condition: stable Complications: No apparent complications Trach site exam: clean, dry Wound care done: 4 x 4 gauze drain Patient did tolerate procedure well.   Education: none  Prescription needs: none    Additional needs: New PMV given to patient today

## 2022-04-10 ENCOUNTER — Other Ambulatory Visit: Payer: Self-pay

## 2022-04-13 ENCOUNTER — Other Ambulatory Visit: Payer: Self-pay

## 2022-04-15 ENCOUNTER — Other Ambulatory Visit: Payer: Self-pay

## 2022-05-20 ENCOUNTER — Ambulatory Visit: Payer: Medicare Other | Admitting: Family Medicine

## 2022-06-23 ENCOUNTER — Encounter: Payer: Medicare Other | Admitting: Family Medicine

## 2022-07-14 ENCOUNTER — Other Ambulatory Visit: Payer: Self-pay

## 2022-07-24 DIAGNOSIS — J9500 Unspecified tracheostomy complication: Secondary | ICD-10-CM | POA: Diagnosis not present

## 2022-08-12 ENCOUNTER — Other Ambulatory Visit: Payer: Self-pay

## 2022-08-13 ENCOUNTER — Other Ambulatory Visit: Payer: Self-pay

## 2022-08-14 ENCOUNTER — Other Ambulatory Visit: Payer: Self-pay

## 2022-08-31 ENCOUNTER — Ambulatory Visit: Payer: Medicare Other | Admitting: Family Medicine

## 2022-09-04 DIAGNOSIS — Z430 Encounter for attention to tracheostomy: Secondary | ICD-10-CM | POA: Diagnosis not present

## 2022-09-04 DIAGNOSIS — E039 Hypothyroidism, unspecified: Secondary | ICD-10-CM | POA: Diagnosis not present

## 2022-09-04 DIAGNOSIS — E118 Type 2 diabetes mellitus with unspecified complications: Secondary | ICD-10-CM | POA: Diagnosis not present

## 2022-09-04 DIAGNOSIS — Z7901 Long term (current) use of anticoagulants: Secondary | ICD-10-CM | POA: Diagnosis not present

## 2022-09-04 DIAGNOSIS — I519 Heart disease, unspecified: Secondary | ICD-10-CM | POA: Diagnosis not present

## 2022-11-03 DIAGNOSIS — Z430 Encounter for attention to tracheostomy: Secondary | ICD-10-CM | POA: Diagnosis not present

## 2022-11-03 DIAGNOSIS — Z7901 Long term (current) use of anticoagulants: Secondary | ICD-10-CM | POA: Diagnosis not present

## 2022-11-03 DIAGNOSIS — Z930 Tracheostomy status: Secondary | ICD-10-CM | POA: Diagnosis not present

## 2022-11-03 DIAGNOSIS — E039 Hypothyroidism, unspecified: Secondary | ICD-10-CM | POA: Diagnosis not present

## 2022-11-03 DIAGNOSIS — I519 Heart disease, unspecified: Secondary | ICD-10-CM | POA: Diagnosis not present

## 2022-11-03 DIAGNOSIS — E119 Type 2 diabetes mellitus without complications: Secondary | ICD-10-CM | POA: Diagnosis not present

## 2022-11-03 DIAGNOSIS — E663 Overweight: Secondary | ICD-10-CM | POA: Diagnosis not present

## 2022-11-03 DIAGNOSIS — E1169 Type 2 diabetes mellitus with other specified complication: Secondary | ICD-10-CM | POA: Diagnosis not present

## 2022-11-12 DIAGNOSIS — R079 Chest pain, unspecified: Secondary | ICD-10-CM | POA: Diagnosis not present

## 2022-11-12 DIAGNOSIS — E039 Hypothyroidism, unspecified: Secondary | ICD-10-CM | POA: Diagnosis not present

## 2022-11-12 DIAGNOSIS — R041 Hemorrhage from throat: Secondary | ICD-10-CM | POA: Diagnosis not present

## 2022-11-12 DIAGNOSIS — Z930 Tracheostomy status: Secondary | ICD-10-CM | POA: Diagnosis not present

## 2022-11-12 DIAGNOSIS — J9501 Hemorrhage from tracheostomy stoma: Secondary | ICD-10-CM | POA: Diagnosis not present

## 2022-11-12 DIAGNOSIS — R059 Cough, unspecified: Secondary | ICD-10-CM | POA: Diagnosis not present

## 2022-11-19 DIAGNOSIS — Z430 Encounter for attention to tracheostomy: Secondary | ICD-10-CM | POA: Diagnosis not present

## 2022-12-04 DIAGNOSIS — E785 Hyperlipidemia, unspecified: Secondary | ICD-10-CM | POA: Diagnosis not present

## 2022-12-04 DIAGNOSIS — Z930 Tracheostomy status: Secondary | ICD-10-CM | POA: Diagnosis not present

## 2022-12-04 DIAGNOSIS — J386 Stenosis of larynx: Secondary | ICD-10-CM | POA: Diagnosis not present

## 2022-12-04 DIAGNOSIS — I519 Heart disease, unspecified: Secondary | ICD-10-CM | POA: Diagnosis not present

## 2022-12-04 DIAGNOSIS — I4819 Other persistent atrial fibrillation: Secondary | ICD-10-CM | POA: Diagnosis not present

## 2022-12-04 DIAGNOSIS — E039 Hypothyroidism, unspecified: Secondary | ICD-10-CM | POA: Diagnosis not present

## 2022-12-04 DIAGNOSIS — Z7901 Long term (current) use of anticoagulants: Secondary | ICD-10-CM | POA: Diagnosis not present

## 2022-12-04 DIAGNOSIS — I69320 Aphasia following cerebral infarction: Secondary | ICD-10-CM | POA: Diagnosis not present

## 2022-12-04 DIAGNOSIS — E119 Type 2 diabetes mellitus without complications: Secondary | ICD-10-CM | POA: Diagnosis not present

## 2022-12-15 DIAGNOSIS — I471 Supraventricular tachycardia, unspecified: Secondary | ICD-10-CM | POA: Diagnosis not present

## 2022-12-15 DIAGNOSIS — I4819 Other persistent atrial fibrillation: Secondary | ICD-10-CM | POA: Diagnosis not present

## 2022-12-15 DIAGNOSIS — I491 Atrial premature depolarization: Secondary | ICD-10-CM | POA: Diagnosis not present

## 2022-12-15 DIAGNOSIS — I493 Ventricular premature depolarization: Secondary | ICD-10-CM | POA: Diagnosis not present

## 2022-12-17 DIAGNOSIS — Z430 Encounter for attention to tracheostomy: Secondary | ICD-10-CM | POA: Diagnosis not present

## 2022-12-30 DIAGNOSIS — Z930 Tracheostomy status: Secondary | ICD-10-CM | POA: Diagnosis not present

## 2022-12-30 DIAGNOSIS — E039 Hypothyroidism, unspecified: Secondary | ICD-10-CM | POA: Diagnosis not present

## 2022-12-30 DIAGNOSIS — I081 Rheumatic disorders of both mitral and tricuspid valves: Secondary | ICD-10-CM | POA: Diagnosis not present

## 2022-12-30 DIAGNOSIS — I4819 Other persistent atrial fibrillation: Secondary | ICD-10-CM | POA: Diagnosis not present

## 2023-01-01 DIAGNOSIS — I4819 Other persistent atrial fibrillation: Secondary | ICD-10-CM | POA: Diagnosis not present

## 2023-01-01 DIAGNOSIS — Z930 Tracheostomy status: Secondary | ICD-10-CM | POA: Diagnosis not present

## 2023-01-01 DIAGNOSIS — E039 Hypothyroidism, unspecified: Secondary | ICD-10-CM | POA: Diagnosis not present
